# Patient Record
Sex: Female | Born: 1960 | Race: White | Hispanic: No | Smoking: Never smoker
Health system: Southern US, Community
[De-identification: ages and names within clinical notes are randomized; demographics above are authoritative.]

## PROBLEM LIST (undated history)

## (undated) DIAGNOSIS — F329 Major depressive disorder, single episode, unspecified: Secondary | ICD-10-CM

## (undated) DIAGNOSIS — K219 Gastro-esophageal reflux disease without esophagitis: Secondary | ICD-10-CM

## (undated) DIAGNOSIS — J069 Acute upper respiratory infection, unspecified: Secondary | ICD-10-CM

## (undated) DIAGNOSIS — G4730 Sleep apnea, unspecified: Secondary | ICD-10-CM

## (undated) DIAGNOSIS — M1990 Unspecified osteoarthritis, unspecified site: Secondary | ICD-10-CM

## (undated) DIAGNOSIS — F32A Depression, unspecified: Secondary | ICD-10-CM

## (undated) DIAGNOSIS — Z9889 Other specified postprocedural states: Secondary | ICD-10-CM

## (undated) DIAGNOSIS — T8859XA Other complications of anesthesia, initial encounter: Secondary | ICD-10-CM

## (undated) DIAGNOSIS — R112 Nausea with vomiting, unspecified: Secondary | ICD-10-CM

## (undated) DIAGNOSIS — I10 Essential (primary) hypertension: Secondary | ICD-10-CM

## (undated) DIAGNOSIS — J309 Allergic rhinitis, unspecified: Secondary | ICD-10-CM

## (undated) DIAGNOSIS — E0590 Thyrotoxicosis, unspecified without thyrotoxic crisis or storm: Secondary | ICD-10-CM

## (undated) DIAGNOSIS — K8020 Calculus of gallbladder without cholecystitis without obstruction: Secondary | ICD-10-CM

## (undated) DIAGNOSIS — I509 Heart failure, unspecified: Secondary | ICD-10-CM

## (undated) DIAGNOSIS — F419 Anxiety disorder, unspecified: Secondary | ICD-10-CM

## (undated) DIAGNOSIS — D649 Anemia, unspecified: Secondary | ICD-10-CM

## (undated) DIAGNOSIS — T4145XA Adverse effect of unspecified anesthetic, initial encounter: Secondary | ICD-10-CM

## (undated) DIAGNOSIS — J45909 Unspecified asthma, uncomplicated: Secondary | ICD-10-CM

## (undated) DIAGNOSIS — Z87442 Personal history of urinary calculi: Secondary | ICD-10-CM

## (undated) DIAGNOSIS — R519 Headache, unspecified: Secondary | ICD-10-CM

## (undated) DIAGNOSIS — J189 Pneumonia, unspecified organism: Secondary | ICD-10-CM

## (undated) HISTORY — DX: Gastro-esophageal reflux disease without esophagitis: K21.9

## (undated) HISTORY — DX: Allergic rhinitis, unspecified: J30.9

## (undated) HISTORY — DX: Major depressive disorder, single episode, unspecified: F32.9

## (undated) HISTORY — DX: Unspecified asthma, uncomplicated: J45.909

## (undated) HISTORY — DX: Depression, unspecified: F32.A

## (undated) HISTORY — PX: ADENOIDECTOMY: SUR15

## (undated) HISTORY — DX: Acute upper respiratory infection, unspecified: J06.9

## (undated) HISTORY — DX: Calculus of gallbladder without cholecystitis without obstruction: K80.20

## (undated) HISTORY — PX: OTHER SURGICAL HISTORY: SHX169

## (undated) HISTORY — PX: TYMPANOSTOMY TUBE PLACEMENT: SHX32

## (undated) HISTORY — DX: Heart failure, unspecified: I50.9

## (undated) HISTORY — PX: TONSILLECTOMY: SUR1361

## (undated) HISTORY — PX: CYST EXCISION: SHX5701

## (undated) HISTORY — PX: OVARIAN CYST REMOVAL: SHX89

---

## 2002-08-09 ENCOUNTER — Emergency Department (HOSPITAL_COMMUNITY): Admission: EM | Admit: 2002-08-09 | Discharge: 2002-08-09 | Payer: Self-pay | Admitting: *Deleted

## 2002-11-28 ENCOUNTER — Encounter: Admission: RE | Admit: 2002-11-28 | Discharge: 2002-11-28 | Payer: Self-pay | Admitting: Family Medicine

## 2002-12-09 ENCOUNTER — Encounter: Admission: RE | Admit: 2002-12-09 | Discharge: 2002-12-09 | Payer: Self-pay | Admitting: Orthopedic Surgery

## 2003-01-02 ENCOUNTER — Other Ambulatory Visit: Admission: RE | Admit: 2003-01-02 | Discharge: 2003-01-02 | Payer: Self-pay | Admitting: Family Medicine

## 2003-01-02 ENCOUNTER — Encounter: Admission: RE | Admit: 2003-01-02 | Discharge: 2003-01-02 | Payer: Self-pay | Admitting: Family Medicine

## 2003-01-02 ENCOUNTER — Encounter: Payer: Self-pay | Admitting: Family Medicine

## 2003-01-16 ENCOUNTER — Encounter: Admission: RE | Admit: 2003-01-16 | Discharge: 2003-01-16 | Payer: Self-pay | Admitting: Sports Medicine

## 2003-01-23 ENCOUNTER — Encounter: Admission: RE | Admit: 2003-01-23 | Discharge: 2003-01-23 | Payer: Self-pay | Admitting: Family Medicine

## 2003-02-17 ENCOUNTER — Encounter: Admission: RE | Admit: 2003-02-17 | Discharge: 2003-02-17 | Payer: Self-pay | Admitting: Family Medicine

## 2003-03-09 ENCOUNTER — Encounter: Admission: RE | Admit: 2003-03-09 | Discharge: 2003-03-09 | Payer: Self-pay | Admitting: Family Medicine

## 2003-04-10 ENCOUNTER — Encounter: Admission: RE | Admit: 2003-04-10 | Discharge: 2003-04-10 | Payer: Self-pay | Admitting: Sports Medicine

## 2003-04-20 ENCOUNTER — Encounter: Admission: RE | Admit: 2003-04-20 | Discharge: 2003-04-20 | Payer: Self-pay | Admitting: Family Medicine

## 2003-05-11 ENCOUNTER — Encounter: Admission: RE | Admit: 2003-05-11 | Discharge: 2003-05-11 | Payer: Self-pay | Admitting: Family Medicine

## 2003-07-07 ENCOUNTER — Encounter: Admission: RE | Admit: 2003-07-07 | Discharge: 2003-07-07 | Payer: Self-pay | Admitting: Family Medicine

## 2003-08-04 ENCOUNTER — Encounter: Admission: RE | Admit: 2003-08-04 | Discharge: 2003-08-04 | Payer: Self-pay | Admitting: Family Medicine

## 2003-08-24 ENCOUNTER — Encounter: Admission: RE | Admit: 2003-08-24 | Discharge: 2003-08-24 | Payer: Self-pay | Admitting: Family Medicine

## 2003-09-15 ENCOUNTER — Encounter: Admission: RE | Admit: 2003-09-15 | Discharge: 2003-09-15 | Payer: Self-pay | Admitting: Sports Medicine

## 2003-10-02 ENCOUNTER — Encounter: Admission: RE | Admit: 2003-10-02 | Discharge: 2003-10-02 | Payer: Self-pay | Admitting: Family Medicine

## 2003-11-27 ENCOUNTER — Encounter: Admission: RE | Admit: 2003-11-27 | Discharge: 2003-11-27 | Payer: Self-pay | Admitting: Family Medicine

## 2003-12-08 ENCOUNTER — Encounter: Admission: RE | Admit: 2003-12-08 | Discharge: 2003-12-08 | Payer: Self-pay | Admitting: Family Medicine

## 2004-03-18 ENCOUNTER — Ambulatory Visit: Payer: Self-pay | Admitting: Family Medicine

## 2004-03-18 ENCOUNTER — Other Ambulatory Visit: Admission: RE | Admit: 2004-03-18 | Discharge: 2004-03-18 | Payer: Self-pay | Admitting: Family Medicine

## 2004-04-04 ENCOUNTER — Ambulatory Visit: Payer: Self-pay | Admitting: Family Medicine

## 2004-05-17 ENCOUNTER — Ambulatory Visit: Payer: Self-pay | Admitting: Family Medicine

## 2004-06-10 ENCOUNTER — Ambulatory Visit: Payer: Self-pay | Admitting: Family Medicine

## 2004-06-14 ENCOUNTER — Encounter: Admission: RE | Admit: 2004-06-14 | Discharge: 2004-06-14 | Payer: Self-pay | Admitting: Family Medicine

## 2004-06-24 ENCOUNTER — Encounter: Admission: RE | Admit: 2004-06-24 | Discharge: 2004-06-24 | Payer: Self-pay | Admitting: Family Medicine

## 2004-09-19 ENCOUNTER — Ambulatory Visit: Payer: Self-pay | Admitting: Family Medicine

## 2004-10-17 ENCOUNTER — Ambulatory Visit: Payer: Self-pay | Admitting: Family Medicine

## 2004-11-07 ENCOUNTER — Ambulatory Visit: Payer: Self-pay | Admitting: Family Medicine

## 2005-01-09 ENCOUNTER — Ambulatory Visit: Payer: Self-pay | Admitting: Family Medicine

## 2005-02-15 ENCOUNTER — Ambulatory Visit: Payer: Self-pay | Admitting: Family Medicine

## 2005-03-05 ENCOUNTER — Emergency Department (HOSPITAL_COMMUNITY): Admission: EM | Admit: 2005-03-05 | Discharge: 2005-03-05 | Payer: Self-pay | Admitting: *Deleted

## 2005-03-06 ENCOUNTER — Ambulatory Visit: Payer: Self-pay | Admitting: Family Medicine

## 2005-03-31 ENCOUNTER — Ambulatory Visit: Payer: Self-pay | Admitting: Family Medicine

## 2005-04-05 ENCOUNTER — Encounter: Admission: RE | Admit: 2005-04-05 | Discharge: 2005-04-05 | Payer: Self-pay | Admitting: Family Medicine

## 2005-06-09 ENCOUNTER — Ambulatory Visit: Payer: Self-pay | Admitting: Family Medicine

## 2005-06-19 ENCOUNTER — Ambulatory Visit: Payer: Self-pay | Admitting: Family Medicine

## 2005-06-26 ENCOUNTER — Ambulatory Visit: Payer: Self-pay | Admitting: Family Medicine

## 2005-09-27 ENCOUNTER — Encounter: Admission: RE | Admit: 2005-09-27 | Discharge: 2005-09-27 | Payer: Self-pay | Admitting: Sports Medicine

## 2005-09-27 ENCOUNTER — Ambulatory Visit: Payer: Self-pay | Admitting: Family Medicine

## 2005-10-03 ENCOUNTER — Encounter (INDEPENDENT_AMBULATORY_CARE_PROVIDER_SITE_OTHER): Payer: Self-pay | Admitting: *Deleted

## 2005-10-03 LAB — CONVERTED CEMR LAB

## 2005-11-03 ENCOUNTER — Ambulatory Visit: Payer: Self-pay | Admitting: Family Medicine

## 2006-01-29 ENCOUNTER — Ambulatory Visit: Payer: Self-pay | Admitting: Family Medicine

## 2006-02-12 ENCOUNTER — Ambulatory Visit: Payer: Self-pay | Admitting: Family Medicine

## 2006-03-07 ENCOUNTER — Ambulatory Visit: Payer: Self-pay | Admitting: Family Medicine

## 2006-04-02 ENCOUNTER — Ambulatory Visit: Payer: Self-pay | Admitting: Family Medicine

## 2006-05-11 ENCOUNTER — Encounter: Admission: RE | Admit: 2006-05-11 | Discharge: 2006-05-11 | Payer: Self-pay | Admitting: Sports Medicine

## 2006-05-31 ENCOUNTER — Ambulatory Visit: Payer: Self-pay | Admitting: Family Medicine

## 2006-07-19 ENCOUNTER — Ambulatory Visit: Payer: Self-pay | Admitting: Sports Medicine

## 2006-08-02 DIAGNOSIS — I10 Essential (primary) hypertension: Secondary | ICD-10-CM | POA: Insufficient documentation

## 2006-08-02 DIAGNOSIS — M1710 Unilateral primary osteoarthritis, unspecified knee: Secondary | ICD-10-CM

## 2006-08-02 DIAGNOSIS — K2100 Gastro-esophageal reflux disease with esophagitis, without bleeding: Secondary | ICD-10-CM | POA: Insufficient documentation

## 2006-08-02 DIAGNOSIS — IMO0002 Reserved for concepts with insufficient information to code with codable children: Secondary | ICD-10-CM | POA: Insufficient documentation

## 2006-08-02 DIAGNOSIS — IMO0001 Reserved for inherently not codable concepts without codable children: Secondary | ICD-10-CM | POA: Insufficient documentation

## 2006-08-02 DIAGNOSIS — E7800 Pure hypercholesterolemia, unspecified: Secondary | ICD-10-CM | POA: Insufficient documentation

## 2006-08-02 DIAGNOSIS — G932 Benign intracranial hypertension: Secondary | ICD-10-CM | POA: Insufficient documentation

## 2006-08-02 DIAGNOSIS — G5600 Carpal tunnel syndrome, unspecified upper limb: Secondary | ICD-10-CM | POA: Insufficient documentation

## 2006-08-02 DIAGNOSIS — F411 Generalized anxiety disorder: Secondary | ICD-10-CM | POA: Insufficient documentation

## 2006-08-02 DIAGNOSIS — N200 Calculus of kidney: Secondary | ICD-10-CM | POA: Insufficient documentation

## 2006-08-02 DIAGNOSIS — N946 Dysmenorrhea, unspecified: Secondary | ICD-10-CM | POA: Insufficient documentation

## 2006-08-02 DIAGNOSIS — J309 Allergic rhinitis, unspecified: Secondary | ICD-10-CM | POA: Insufficient documentation

## 2006-08-02 DIAGNOSIS — K649 Unspecified hemorrhoids: Secondary | ICD-10-CM | POA: Insufficient documentation

## 2006-08-02 DIAGNOSIS — E669 Obesity, unspecified: Secondary | ICD-10-CM | POA: Insufficient documentation

## 2006-08-03 ENCOUNTER — Encounter (INDEPENDENT_AMBULATORY_CARE_PROVIDER_SITE_OTHER): Payer: Self-pay | Admitting: *Deleted

## 2006-09-04 ENCOUNTER — Telehealth: Payer: Self-pay | Admitting: *Deleted

## 2006-09-04 ENCOUNTER — Ambulatory Visit: Payer: Self-pay | Admitting: Sports Medicine

## 2006-09-28 ENCOUNTER — Encounter: Admission: RE | Admit: 2006-09-28 | Discharge: 2006-09-28 | Payer: Self-pay | Admitting: Family Medicine

## 2006-09-28 ENCOUNTER — Ambulatory Visit: Payer: Self-pay | Admitting: Family Medicine

## 2006-10-10 ENCOUNTER — Telehealth: Payer: Self-pay | Admitting: *Deleted

## 2006-10-11 ENCOUNTER — Ambulatory Visit: Payer: Self-pay | Admitting: Family Medicine

## 2006-10-11 ENCOUNTER — Telehealth (INDEPENDENT_AMBULATORY_CARE_PROVIDER_SITE_OTHER): Payer: Self-pay | Admitting: *Deleted

## 2006-11-05 ENCOUNTER — Encounter: Payer: Self-pay | Admitting: Family Medicine

## 2006-11-26 ENCOUNTER — Ambulatory Visit: Payer: Self-pay | Admitting: Family Medicine

## 2006-11-26 DIAGNOSIS — M545 Low back pain, unspecified: Secondary | ICD-10-CM | POA: Insufficient documentation

## 2006-11-26 DIAGNOSIS — J45909 Unspecified asthma, uncomplicated: Secondary | ICD-10-CM | POA: Insufficient documentation

## 2006-11-26 HISTORY — DX: Unspecified asthma, uncomplicated: J45.909

## 2006-11-26 LAB — CONVERTED CEMR LAB
ALT: 18 units/L (ref 0–35)
AST: 19 units/L (ref 0–37)
Albumin: 4.7 g/dL (ref 3.5–5.2)
Alkaline Phosphatase: 90 units/L (ref 39–117)
BUN: 9 mg/dL (ref 6–23)
CO2: 22 meq/L (ref 19–32)
Calcium: 10.2 mg/dL (ref 8.4–10.5)
Chloride: 102 meq/L (ref 96–112)
Creatinine, Ser: 0.81 mg/dL (ref 0.40–1.20)
Glucose, Bld: 89 mg/dL (ref 70–99)
HCT: 47.1 % — ABNORMAL HIGH (ref 36.0–46.0)
Hemoglobin: 15.6 g/dL — ABNORMAL HIGH (ref 12.0–15.0)
MCHC: 33.1 g/dL (ref 30.0–36.0)
MCV: 87.9 fL (ref 78.0–100.0)
Platelets: 255 10*3/uL (ref 150–400)
Potassium: 4 meq/L (ref 3.5–5.3)
RBC: 5.36 M/uL — ABNORMAL HIGH (ref 3.87–5.11)
RDW: 13.8 % (ref 11.5–14.0)
Sodium: 140 meq/L (ref 135–145)
Total Bilirubin: 0.8 mg/dL (ref 0.3–1.2)
Total Protein: 8.2 g/dL (ref 6.0–8.3)
WBC: 7.7 10*3/uL (ref 4.0–10.5)

## 2007-02-05 ENCOUNTER — Telehealth (INDEPENDENT_AMBULATORY_CARE_PROVIDER_SITE_OTHER): Payer: Self-pay | Admitting: *Deleted

## 2007-02-18 ENCOUNTER — Ambulatory Visit: Payer: Self-pay | Admitting: Family Medicine

## 2007-03-20 ENCOUNTER — Ambulatory Visit: Payer: Self-pay | Admitting: Family Medicine

## 2007-03-21 ENCOUNTER — Telehealth (INDEPENDENT_AMBULATORY_CARE_PROVIDER_SITE_OTHER): Payer: Self-pay | Admitting: *Deleted

## 2007-03-22 ENCOUNTER — Telehealth: Payer: Self-pay | Admitting: *Deleted

## 2007-03-26 ENCOUNTER — Telehealth: Payer: Self-pay | Admitting: *Deleted

## 2007-03-27 ENCOUNTER — Encounter: Payer: Self-pay | Admitting: *Deleted

## 2007-04-01 ENCOUNTER — Telehealth: Payer: Self-pay | Admitting: Family Medicine

## 2007-04-29 ENCOUNTER — Telehealth: Payer: Self-pay | Admitting: *Deleted

## 2007-04-29 ENCOUNTER — Ambulatory Visit: Payer: Self-pay | Admitting: Family Medicine

## 2007-05-01 LAB — CONVERTED CEMR LAB
ALT: 13 units/L (ref 0–35)
AST: 13 units/L (ref 0–37)
Albumin: 4.5 g/dL (ref 3.5–5.2)
Alkaline Phosphatase: 79 units/L (ref 39–117)
BUN: 12 mg/dL (ref 6–23)
CO2: 22 meq/L (ref 19–32)
Calcium: 9.7 mg/dL (ref 8.4–10.5)
Chloride: 104 meq/L (ref 96–112)
Cholesterol: 221 mg/dL — ABNORMAL HIGH (ref 0–200)
Creatinine, Ser: 0.79 mg/dL (ref 0.40–1.20)
Glucose, Bld: 94 mg/dL (ref 70–99)
HDL: 40 mg/dL (ref >39)
LDL Cholesterol: 122 mg/dL — ABNORMAL HIGH (ref 0–99)
Potassium: 4.1 meq/L (ref 3.5–5.3)
Sodium: 139 meq/L (ref 135–145)
Total Bilirubin: 0.5 mg/dL (ref 0.3–1.2)
Total CHOL/HDL Ratio: 5.5
Total Protein: 7.5 g/dL (ref 6.0–8.3)
Triglycerides: 296 mg/dL — ABNORMAL HIGH (ref <150)
VLDL: 59 mg/dL — ABNORMAL HIGH (ref 0–40)

## 2007-05-08 ENCOUNTER — Encounter: Payer: Self-pay | Admitting: Family Medicine

## 2007-05-13 ENCOUNTER — Encounter: Admission: RE | Admit: 2007-05-13 | Discharge: 2007-05-13 | Payer: Self-pay | Admitting: Family Medicine

## 2007-05-21 ENCOUNTER — Telehealth: Payer: Self-pay | Admitting: *Deleted

## 2007-06-19 ENCOUNTER — Encounter: Payer: Self-pay | Admitting: Family Medicine

## 2007-07-08 ENCOUNTER — Ambulatory Visit: Payer: Self-pay | Admitting: Family Medicine

## 2007-07-08 ENCOUNTER — Encounter: Payer: Self-pay | Admitting: *Deleted

## 2007-07-08 ENCOUNTER — Telehealth: Payer: Self-pay | Admitting: *Deleted

## 2007-07-12 ENCOUNTER — Telehealth: Payer: Self-pay | Admitting: Family Medicine

## 2007-07-17 ENCOUNTER — Telehealth: Payer: Self-pay | Admitting: *Deleted

## 2007-07-17 ENCOUNTER — Ambulatory Visit: Payer: Self-pay | Admitting: Family Medicine

## 2007-07-17 DIAGNOSIS — L219 Seborrheic dermatitis, unspecified: Secondary | ICD-10-CM | POA: Insufficient documentation

## 2007-07-24 ENCOUNTER — Telehealth: Payer: Self-pay | Admitting: *Deleted

## 2007-07-30 ENCOUNTER — Telehealth: Payer: Self-pay | Admitting: *Deleted

## 2007-07-31 ENCOUNTER — Ambulatory Visit: Payer: Self-pay | Admitting: Family Medicine

## 2007-10-30 ENCOUNTER — Telehealth: Payer: Self-pay | Admitting: *Deleted

## 2007-10-30 LAB — CONVERTED CEMR LAB
Pap Smear: NORMAL
Pap Smear: NORMAL

## 2007-11-01 ENCOUNTER — Encounter: Payer: Self-pay | Admitting: Family Medicine

## 2007-11-01 ENCOUNTER — Ambulatory Visit: Payer: Self-pay | Admitting: Family Medicine

## 2007-11-08 ENCOUNTER — Ambulatory Visit: Payer: Self-pay | Admitting: Family Medicine

## 2007-11-08 LAB — CONVERTED CEMR LAB
BUN: 11 mg/dL (ref 6–23)
CO2: 21 meq/L (ref 19–32)
Calcium: 9.4 mg/dL (ref 8.4–10.5)
Chloride: 104 meq/L (ref 96–112)
Creatinine, Ser: 0.73 mg/dL (ref 0.40–1.20)
Glucose, Bld: 101 mg/dL — ABNORMAL HIGH (ref 70–99)
HCT: 43.3 % (ref 36.0–46.0)
Hemoglobin: 14.3 g/dL (ref 12.0–15.0)
MCHC: 33 g/dL (ref 30.0–36.0)
MCV: 91.2 fL (ref 78.0–100.0)
Platelets: 228 10*3/uL (ref 150–400)
Potassium: 4 meq/L (ref 3.5–5.3)
RBC: 4.75 M/uL (ref 3.87–5.11)
RDW: 13.6 % (ref 11.5–15.5)
Sodium: 139 meq/L (ref 135–145)
TSH: 0.707 microintl units/mL (ref 0.350–5.50)
WBC: 6.6 10*3/uL (ref 4.0–10.5)

## 2007-12-12 ENCOUNTER — Encounter: Payer: Self-pay | Admitting: Family Medicine

## 2008-01-14 ENCOUNTER — Encounter: Payer: Self-pay | Admitting: Family Medicine

## 2008-03-04 ENCOUNTER — Ambulatory Visit: Payer: Self-pay | Admitting: Family Medicine

## 2008-04-03 ENCOUNTER — Encounter: Payer: Self-pay | Admitting: Family Medicine

## 2008-05-13 ENCOUNTER — Encounter: Admission: RE | Admit: 2008-05-13 | Discharge: 2008-05-13 | Payer: Self-pay | Admitting: Family Medicine

## 2008-06-12 ENCOUNTER — Ambulatory Visit: Payer: Self-pay | Admitting: Family Medicine

## 2008-06-12 ENCOUNTER — Telehealth: Payer: Self-pay | Admitting: *Deleted

## 2008-06-26 ENCOUNTER — Encounter (INDEPENDENT_AMBULATORY_CARE_PROVIDER_SITE_OTHER): Payer: Self-pay | Admitting: *Deleted

## 2008-07-09 ENCOUNTER — Telehealth: Payer: Self-pay | Admitting: Family Medicine

## 2008-07-09 ENCOUNTER — Ambulatory Visit: Payer: Self-pay | Admitting: Family Medicine

## 2008-07-22 ENCOUNTER — Emergency Department (HOSPITAL_COMMUNITY): Admission: EM | Admit: 2008-07-22 | Discharge: 2008-07-22 | Payer: Self-pay | Admitting: Emergency Medicine

## 2008-07-22 ENCOUNTER — Telehealth: Payer: Self-pay | Admitting: Family Medicine

## 2008-08-13 ENCOUNTER — Encounter: Payer: Self-pay | Admitting: Family Medicine

## 2008-08-26 ENCOUNTER — Ambulatory Visit: Payer: Self-pay | Admitting: Family Medicine

## 2008-09-11 ENCOUNTER — Encounter: Payer: Self-pay | Admitting: Family Medicine

## 2008-10-12 ENCOUNTER — Encounter (INDEPENDENT_AMBULATORY_CARE_PROVIDER_SITE_OTHER): Payer: Self-pay | Admitting: Family Medicine

## 2008-10-12 ENCOUNTER — Ambulatory Visit: Payer: Self-pay | Admitting: Family Medicine

## 2008-10-12 ENCOUNTER — Telehealth: Payer: Self-pay | Admitting: Family Medicine

## 2008-10-12 LAB — CONVERTED CEMR LAB
Bilirubin Urine: NEGATIVE
Glucose, Urine, Semiquant: NEGATIVE
Ketones, urine, test strip: NEGATIVE
Nitrite: NEGATIVE
Protein, U semiquant: NEGATIVE
Specific Gravity, Urine: 1.025
Urobilinogen, UA: 0.2
WBC Urine, dipstick: NEGATIVE
pH: 6

## 2008-10-13 ENCOUNTER — Telehealth (INDEPENDENT_AMBULATORY_CARE_PROVIDER_SITE_OTHER): Payer: Self-pay | Admitting: *Deleted

## 2008-10-30 ENCOUNTER — Ambulatory Visit: Payer: Self-pay | Admitting: Family Medicine

## 2008-11-03 LAB — CONVERTED CEMR LAB
ALT: 12 units/L (ref 0–35)
AST: 12 units/L (ref 0–37)
Albumin: 4.2 g/dL (ref 3.5–5.2)
Alkaline Phosphatase: 86 units/L (ref 39–117)
BUN: 16 mg/dL (ref 6–23)
CO2: 22 meq/L (ref 19–32)
Calcium: 9.5 mg/dL (ref 8.4–10.5)
Chloride: 106 meq/L (ref 96–112)
Cholesterol: 249 mg/dL — ABNORMAL HIGH (ref 0–200)
Creatinine, Ser: 0.8 mg/dL (ref 0.40–1.20)
Glucose, Bld: 111 mg/dL — ABNORMAL HIGH (ref 70–99)
HDL: 40 mg/dL (ref >39)
LDL Cholesterol: 140 mg/dL — ABNORMAL HIGH (ref 0–99)
Potassium: 4.2 meq/L (ref 3.5–5.3)
Sodium: 142 meq/L (ref 135–145)
TSH: 0.596 microintl units/mL (ref 0.350–4.500)
Total Bilirubin: 0.4 mg/dL (ref 0.3–1.2)
Total CHOL/HDL Ratio: 6.2
Total Protein: 7 g/dL (ref 6.0–8.3)
Triglycerides: 346 mg/dL — ABNORMAL HIGH (ref <150)
VLDL: 69 mg/dL — ABNORMAL HIGH (ref 0–40)

## 2008-11-04 ENCOUNTER — Telehealth: Payer: Self-pay | Admitting: *Deleted

## 2009-02-15 ENCOUNTER — Encounter: Payer: Self-pay | Admitting: Family Medicine

## 2009-03-10 ENCOUNTER — Ambulatory Visit: Payer: Self-pay | Admitting: Family Medicine

## 2009-03-10 DIAGNOSIS — G4730 Sleep apnea, unspecified: Secondary | ICD-10-CM | POA: Insufficient documentation

## 2009-03-12 ENCOUNTER — Encounter: Payer: Self-pay | Admitting: Family Medicine

## 2009-03-12 ENCOUNTER — Ambulatory Visit: Payer: Self-pay | Admitting: Family Medicine

## 2009-03-16 ENCOUNTER — Encounter: Payer: Self-pay | Admitting: *Deleted

## 2009-03-22 ENCOUNTER — Ambulatory Visit: Payer: Self-pay | Admitting: Family Medicine

## 2009-03-22 ENCOUNTER — Telehealth: Payer: Self-pay | Admitting: Family Medicine

## 2009-03-31 ENCOUNTER — Encounter: Payer: Self-pay | Admitting: Family Medicine

## 2009-04-19 ENCOUNTER — Encounter: Payer: Self-pay | Admitting: Family Medicine

## 2009-04-19 ENCOUNTER — Ambulatory Visit (HOSPITAL_BASED_OUTPATIENT_CLINIC_OR_DEPARTMENT_OTHER): Admission: RE | Admit: 2009-04-19 | Discharge: 2009-04-19 | Payer: Self-pay | Admitting: Family Medicine

## 2009-05-14 ENCOUNTER — Encounter: Admission: RE | Admit: 2009-05-14 | Discharge: 2009-05-14 | Payer: Self-pay | Admitting: Family Medicine

## 2009-06-25 ENCOUNTER — Ambulatory Visit: Payer: Self-pay | Admitting: Family Medicine

## 2009-06-25 ENCOUNTER — Telehealth: Payer: Self-pay | Admitting: Family Medicine

## 2009-07-08 ENCOUNTER — Ambulatory Visit: Payer: Self-pay | Admitting: Family Medicine

## 2009-07-08 DIAGNOSIS — H7290 Unspecified perforation of tympanic membrane, unspecified ear: Secondary | ICD-10-CM | POA: Insufficient documentation

## 2009-07-22 ENCOUNTER — Encounter: Payer: Self-pay | Admitting: Family Medicine

## 2009-07-23 ENCOUNTER — Ambulatory Visit: Payer: Self-pay | Admitting: Family Medicine

## 2009-07-23 DIAGNOSIS — I781 Nevus, non-neoplastic: Secondary | ICD-10-CM | POA: Insufficient documentation

## 2009-07-23 LAB — CONVERTED CEMR LAB
ALT: 11 units/L (ref 0–35)
AST: 11 units/L (ref 0–37)
Albumin: 4.5 g/dL (ref 3.5–5.2)
Alkaline Phosphatase: 78 units/L (ref 39–117)
BUN: 17 mg/dL (ref 6–23)
CO2: 24 meq/L (ref 19–32)
Calcium: 9.6 mg/dL (ref 8.4–10.5)
Chloride: 99 meq/L (ref 96–112)
Creatinine, Ser: 0.73 mg/dL (ref 0.40–1.20)
Direct LDL: 95 mg/dL
Glucose, Bld: 119 mg/dL — ABNORMAL HIGH (ref 70–99)
Potassium: 4.1 meq/L (ref 3.5–5.3)
Sodium: 137 meq/L (ref 135–145)
Total Bilirubin: 0.5 mg/dL (ref 0.3–1.2)
Total Protein: 7.4 g/dL (ref 6.0–8.3)

## 2009-07-27 ENCOUNTER — Encounter: Payer: Self-pay | Admitting: Family Medicine

## 2009-08-01 ENCOUNTER — Telehealth: Payer: Self-pay | Admitting: Family Medicine

## 2009-08-04 ENCOUNTER — Telehealth: Payer: Self-pay | Admitting: Family Medicine

## 2009-10-21 ENCOUNTER — Telehealth: Payer: Self-pay | Admitting: Family Medicine

## 2009-10-22 ENCOUNTER — Ambulatory Visit: Payer: Self-pay | Admitting: Family Medicine

## 2009-10-27 ENCOUNTER — Ambulatory Visit: Payer: Self-pay | Admitting: Family Medicine

## 2010-02-24 ENCOUNTER — Encounter: Payer: Self-pay | Admitting: Family Medicine

## 2010-03-01 ENCOUNTER — Ambulatory Visit: Payer: Self-pay | Admitting: Family Medicine

## 2010-03-01 ENCOUNTER — Telehealth: Payer: Self-pay | Admitting: Family Medicine

## 2010-03-16 ENCOUNTER — Ambulatory Visit: Payer: Self-pay | Admitting: Family Medicine

## 2010-05-04 ENCOUNTER — Ambulatory Visit: Payer: Self-pay | Admitting: Family Medicine

## 2010-05-06 ENCOUNTER — Encounter: Payer: Self-pay | Admitting: Family Medicine

## 2010-05-06 ENCOUNTER — Ambulatory Visit: Payer: Self-pay | Admitting: Family Medicine

## 2010-05-09 LAB — CONVERTED CEMR LAB
ALT: 14 units/L (ref 0–35)
AST: 13 units/L (ref 0–37)
Albumin: 4 g/dL (ref 3.5–5.2)
Alkaline Phosphatase: 78 units/L (ref 39–117)
BUN: 11 mg/dL (ref 6–23)
CO2: 24 meq/L (ref 19–32)
Calcium: 8.7 mg/dL (ref 8.4–10.5)
Chloride: 104 meq/L (ref 96–112)
Cholesterol: 147 mg/dL (ref 0–200)
Creatinine, Ser: 0.68 mg/dL (ref 0.40–1.20)
Glucose, Bld: 107 mg/dL — ABNORMAL HIGH (ref 70–99)
HDL: 40 mg/dL (ref >39)
LDL Cholesterol: 75 mg/dL (ref 0–99)
Potassium: 4.5 meq/L (ref 3.5–5.3)
Sodium: 139 meq/L (ref 135–145)
Total Bilirubin: 0.4 mg/dL (ref 0.3–1.2)
Total CHOL/HDL Ratio: 3.7
Total Protein: 6.5 g/dL (ref 6.0–8.3)
Triglycerides: 159 mg/dL — ABNORMAL HIGH (ref <150)
VLDL: 32 mg/dL (ref 0–40)

## 2010-05-18 ENCOUNTER — Encounter
Admission: RE | Admit: 2010-05-18 | Discharge: 2010-05-18 | Payer: Self-pay | Source: Home / Self Care | Attending: Family Medicine | Admitting: Family Medicine

## 2010-06-30 ENCOUNTER — Ambulatory Visit
Admission: RE | Admit: 2010-06-30 | Discharge: 2010-06-30 | Payer: Self-pay | Source: Home / Self Care | Attending: Family Medicine | Admitting: Family Medicine

## 2010-06-30 DIAGNOSIS — R059 Cough, unspecified: Secondary | ICD-10-CM | POA: Insufficient documentation

## 2010-06-30 DIAGNOSIS — R05 Cough: Secondary | ICD-10-CM | POA: Insufficient documentation

## 2010-07-05 NOTE — Letter (Signed)
Summary: Generic Letter  [facility]  [address]   [place], [place] [address]   Phone: [phone]  Fax: [phone]    07/08/2007  Jamiria Andal [address] [place], [place]  [address]  To Whom This May Concern ,        Reyne Falconi  did come to [facility]  today for an appointment with her doctor.        Sincerely,   Dagny Fiorentino RN [facility]

## 2010-07-05 NOTE — Progress Notes (Signed)
Summary: Rx Req  Phone Note Refill Request Call back at Home Phone [phone] Message from:  Patient  Pt needs rx for Advair.  Pt uses [facility].  Initial call taken by: Clydell Hakim,  August 04, 2009 3:35 PM  Follow-up for Phone Call        will forward to MD. Adviar is not on medication list. Follow-up by: Theresia Lo RN,  August 04, 2009 4:00 PM  Additional Follow-up for Phone Call Additional follow up Details #1::        Pt on changed from Advair to Qvar in the fall.  should be usign that Additional Follow-up by: Pearlean Brownie MD,  August 05, 2009 8:23 AM    Additional Follow-up for Phone Call Additional follow up Details #2::    patient notified. she does have this but has never used. explained this is in place of advair.  Follow-up by: Theresia Lo RN,  August 05, 2009 8:33 AM

## 2010-07-05 NOTE — Assessment & Plan Note (Signed)
Summary: flu shot/kh  Nurse Visit   Vital Signs:  Patient profile:   50 year old female Temp:     49 degrees F  Vitals Entered By: Theresia Lo RN (March 16, 2010 4:49 PM)  Allergies: 1)  ! Etodolac (Etodolac) 2)  Amoxicillin (Amoxicillin) 3)  Diclofenac Sodium (Diclofenac Sodium) 4)  Iodine (Iodine) 5)  Prilosec (Omeprazole) 6)  Protonix (Pantoprazole Sodium)  Immunizations Administered:  Influenza Vaccine # 1:    Vaccine Type: Fluvax 3+    Site: left deltoid    Mfr: GlaxoSmithKline    Dose: 0.5 ml    Route: IM    Given by: Theresia Lo RN    Exp. Date: 11/30/2010    Lot #: [id number]    VIS given: 12/28/09 version given March 16, 2010.  Flu Vaccine Consent Questions:    Do you have a history of severe allergic reactions to this vaccine? no    Any prior history of allergic reactions to egg and/or gelatin? no    Do you have a sensitivity to the preservative Thimersol? no    Do you have a past history of Guillan-Barre Syndrome? no    Do you currently have an acute febrile illness? no    Have you ever had a severe reaction to latex? no    Vaccine information given and explained to patient? yes    Are you currently pregnant? no  Orders Added: 1)  Flu Vaccine 24yrs + [90658] 2)  Admin 1st Vaccine [09811]

## 2010-07-05 NOTE — Progress Notes (Signed)
Summary: Triage  Phone Note Call from Patient Call back at Home Phone [phone]   Summary of Call: wants to speak with rn about getting worked in today for a cough. Initial call taken by: Haydee Salter,  July 09, 2008 10:45 AM  Follow-up for Phone Call        c/o persistant cough. producvtive of clear mucous. disturbing sleep.denies fever. head congestion as well. workin with Dr. Lanier Prude at 11:15 Follow-up by: Golden Circle RN,  July 09, 2008 10:47 AM

## 2010-07-05 NOTE — Progress Notes (Signed)
Summary: REQUEST TO SPEAK TO RN  Phone Note Call from Patient   Caller: Patient Summary of Call: REQUESTING TO SPEAK TO RN RE: 3 SWOLLEN FINGER ON RIGHT HAND. CALL PT AT [phone] Initial call taken by: Dedra Skeens CMA,,  Oct 30, 2007 2:43 PM  Follow-up for Phone Call        3 fingers on R hand have "locked up" on her. carpel tunnel surgery  done 2006. swollen, painful. using elavil, flexeril, ibuprofen. desires appt fri am. pcp not available Follow-up by: Golden Circle RN,  Oct 30, 2007 2:49 PM

## 2010-07-05 NOTE — Assessment & Plan Note (Signed)
Summary: F/U ASTHMA [facility]   Vital Signs:  Patient Profile:   50 Years Old Female Weight:      330 pounds Temp:     99.2 degrees F Pulse rate:   84 / minute BP sitting:   158 / 85  (left arm)  Pt. in pain?   yes    Location:   back and knees    Intensity:   8    Type:       aching  Vitals Entered ByJacki Cones RN (November 26, 2006 1:47 PM)                PCP:  Doralee Albino MD  Chief Complaint:  f/u asthma and back and knee.  History of Present Illness: 1. C/O low back pain.  She is cleaning apartment in preparation for moving.    2. Needs note for letters documenting necessity of: a. move to a different place b. transportaion other than city bus c. air conditioning  3. C/O diarrhea.  Feels it may be do to stress of move.  Not eating much.  Weight noted to be down 10 lbs since last visit.  4. Also C/O carpal tunnel syndrome  Hands have more numbness.          Physical Exam  General:     alert, well-developed, and overweight-appearing.    Also appears mildly unkempt Lungs:     Normal respiratory effort, chest expands symmetrically. Lungs are clear to auscultation, no crackles or wheezes. Heart:     Normal rate and regular rhythm. S1 and S2 normal without gallop, murmur, click, rub or other extra sounds. Abdomen:     Normal, obese.  Possibly small infraumbilical hernia Msk:     Has low back pain.  No leg parasthesias.  DTRs OK    Impression & Recommendations:  Problem # 1:  ASTHMA NOS W/O STATUS ASTHMATICUS (ICD-493.90) No wheezing continue current Rx Orders: [facility]- Est  Level 4 (99214)   Problem # 2:  LOW BACK PAIN SYNDROME (ICD-724.2) Mostly related to lifting and obesity.  Continue Motrin 800 mg.  Added flexaril via Dr. Tiajuana Amass Orders: [facility]- Est  Level 4 ([address]109)   Problem # 3:  DIARRHEA, ACUTE (ICD-787.91) Doubt serious pathology.  Perhaps IBS with stress.  No intervention for now Orders: [facility]- Est  Level 4 (60454)   Problem # 4:  MALADJUSTMENT,  SOCIAL (ICD-V62.4) Very poor living situation.  Letter provided Orders: [facility]- Est  Level 4 (09811)   Problem # 5:  HYPERTENSION, BENIGN SYSTEMIC (ICD-401.1) Poor control now.  Previously OK No changes Orders: [facility] ([address]78-29562) [facility]- Est  Level 4 (13086)   Problem # 6:  MENSTRUATION, PAINFUL (ICD-625.3)  Orders: [facility] ([address]846)

## 2010-07-05 NOTE — Assessment & Plan Note (Signed)
Summary: ear pain   Vital Signs:  Patient Profile:   50 Years Old Female Weight:      239 pounds Temp:     97.7 degrees F Pulse rate:   79 / minute BP sitting:   134 / 83  Pt. in pain?   yes    Location:   right ear    Intensity:   6  Vitals Entered By: Jone Baseman CMA (July 17, 2007 1:36 PM)                  PCP:  Doralee Albino MD  Chief Complaint:  right ear pain.  History of Present Illness: Right ear pain started on Monday (2 days ago). Associated symptoms include popping in ears, nasal congestion, drainage down throat, frontal sinus pressure, HAs, ringing in ears, SOB, and sore throat on and off.  Denies fever, GI complaints, hearing loss, chestpain or palpitations.   Chronic cough and nasal congestion she feels is related to her asthma unchanged.  patient has two children that are sick with URI illnesses.  Patient also has chronic scalp itching and flaking that extends to hairline    Current Allergies: AMOXICILLIN (AMOXICILLIN) DICLOFENAC SODIUM (DICLOFENAC SODIUM) IODINE (IODINE) PRILOSEC (OMEPRAZOLE) PROTONIX (PANTOPRAZOLE SODIUM)  Past Medical History:    Reviewed history from 09/28/2006 and no changes required:       gestational diabetes -        at risk for DM Type II  Past Surgical History:    Reviewed history from 08/02/2006 and no changes required:       Rt carpal tunnel release - 12/04/2002   Family History:    Reviewed history from 08/02/2006 and no changes required:       - CVA, + osteoporosis, CAD, Ca, DM, EtOHism, high Chol  Social History:    Reviewed history from 08/02/2006 and no changes required:       Lives in government housing in [place] no [facility]; Has an old Zenaida Niece for transportation; non smoker, non drinker; lives with children age 60,4, and 44 (in 2004) father of children has left the family     Physical Exam  General:     Well-developed,well-nourished,in no acute distress; alert,appropriate and cooperative throughout  examinationoverweight-appearing.   Head:     Normocephalic and atraumatic without obvious abnormalities. No apparent alopecia or balding. Eyes:     No corneal or conjunctival inflammation noted. EOMI. Perrla. Funduscopic exam benign, without hemorrhages, exudates or papilledema. Vision grossly normal. Ears:     External ear exam shows no significant lesions or deformities.  Otoscopic examination reveals clear canals, tympanic membranes are intact bilaterally without bulging, retraction, inflammation or discharge. Hearing is grossly normal bilaterally. Nose:     mucosal erythema, L frontal sinus tenderness, and R frontal sinus tenderness.   Mouth:     postnasal drip.  posterior oropharyngeal erythema. Neck:     Dowanger's hump from obesity and poor posture.  Paraspinous muscle spasms. Lungs:     Normal respiratory effort, chest expands symmetrically. Lungs are clear to auscultation, no crackles or wheezes. Heart:     Normal rate and regular rhythm. S1 and S2 normal without gallop, murmur, click, rub or other extra sounds. Skin:     yellowish flaking in scalp and redness and flaking of skin near hairline    Impression & Recommendations:  Problem # 1:  VIRAL URI (ICD-465.9) Assessment: New No evidence of bacterial infection.  Reassured patient that antibiotics were not necessary.  Patient seems to have some Eustachian Tube dysfunction. So will give nasonex for symtpomatic relief and nasal saline drops Her updated medication list for this problem includes:    Ibuprofen 800 Mg Tabs (Ibuprofen) ..... One three times a day as needed for pain  Orders: [facility]- Est Level  3 (16109)   Problem # 2:  SEBORRHEIC DERMATITIS (ICD-690.10) Assessment: New Nizoral shampoo 2% apply daily as needed and taper down to weekly use Orders: [facility]- Est Level  3 (60454)   Complete Medication List: 1)  Advair Diskus 500-50 Mcg/dose Misc (Fluticasone-salmeterol) .... One puff twice daily 2)  Albuterol 90  Mcg/act Aers (Albuterol) .... Inhale 2 puff using inhaler every four hours 3)  Amitriptyline Hcl 25 Mg Tabs (Amitriptyline hcl) .... One tab three times daily 4)  Clonazepam 1 Mg Tabs (Clonazepam) .... Take 1 tablet by mouth twice a day 5)  Cyclobenzaprine Hcl 10 Mg Tabs (Cyclobenzaprine hcl) .... Take 1 tablet by mouth every night as needed for back/neck pain and spasm 6)  Fluconazole 100 Mg Tabs (Fluconazole) .Marland Kitchen.. 1 tablet by mouth once a day 7)  Fluoxetine Hcl 40 Mg Caps (Fluoxetine hcl) .... Two caps daily 8)  Hydrochlorothiazide 25 Mg Tabs (Hydrochlorothiazide) .... One by mouth daily 9)  Hydrocortisone 2.5 % Oint (Hydrocortisone) .... Apply a small amount to skin every night 10)  Lisinopril 10 Mg Tabs (Lisinopril) .... Take 1 tablet by mouth once a day 11)  Norethindrone Acetate 5 Mg Tabs (Norethindrone acetate) .... Take 1 tablet by mouth once a day 12)  Pepcid 20 Mg Tabs (Famotidine) .... One tab daily 13)  Simvastatin 20 Mg Tabs (Simvastatin) .... Take 1 tablet by mouth every night 14)  Trazodone Hcl 100 Mg Tabs (Trazodone hcl) .... Take 1 tablet by mouth at bedtime 15)  Ibuprofen 800 Mg Tabs (Ibuprofen) .... One three times a day as needed for pain   Patient Instructions: 1)  you seem to have a sinus infection so I have prescribed nasonex and nasal saline to help 2)  you can also take ibuprofen for pain relief 3)  call if you get fever >101 or worsening symtpoms    ]

## 2010-07-05 NOTE — Assessment & Plan Note (Signed)
Summary: neck, upper back and shoulder pain/ls   Vital Signs:  Patient Profile:   50 Years Old Female Weight:      343.1 pounds Temp:     97.1 degrees F Pulse rate:   80 / minute BP sitting:   131 / 90  (right arm)  Pt. in pain?   yes    Location:   head    Intensity:   4  Vitals Entered By: Starleen Blue RN (July 31, 2007 9:35 AM)                  PCP:  Doralee Albino MD  Chief Complaint:  Shoulder pain.  History of Present Illness: pain: pain in R shoulder, hurting bad, arm is swollen per her report.  Started hurting 2/5 weeks ago, sleeps with alot of pillows.  feels like a big ball or tight.  putting heat on it, helps, but gets too hot.  Also pops.  Pt has tried Rx 800mg  ibuprofen and flexeril 10mg  only at night.  occasionally relief from that pain with flexeril.    Sleeps on R side.  Obese.  not doing stretching or exercises.  lots of stress lately.  also with fibromyalgia.  9/10 on pain.      Current Allergies: AMOXICILLIN (AMOXICILLIN) DICLOFENAC SODIUM (DICLOFENAC SODIUM) IODINE (IODINE) PRILOSEC (OMEPRAZOLE) PROTONIX (PANTOPRAZOLE SODIUM)  Past Medical History:    gestational diabetes -     at risk for DM Type II    fibromyalgia  Past Surgical History:    Rt carpal tunnel release - 12/04/2002     Family History:    - CVA, + osteoporosis, CAD, Ca, DM, EtOHism, high Chol    Social History:    Lives in government housing in [place] no [place]; Has an old Zenaida Niece for transportation; non smoker, non drinker; lives with children age 48,4, and 62 (in 2004) father of children has left the family       Physical Exam  General:     Well-developed,obese,in no acute distress; alert,appropriate and cooperative throughout examination   Neck:     + muscle spasm with tightness, and soreness radiating down R arm and shoulder, no radiculopathy symptoms, negative spurling's sign Lungs:     Normal respiratory effort, chest expands symmetrically. Lungs are clear to  auscultation, no crackles or wheezes. Heart:     Normal rate and regular rhythm. S1 and S2 normal without gallop, murmur, click, rub or other extra sounds. Msk:     Cervical spine with FROM, no point tenderness.  Thoracic, lumbar, and sacral spine without point tenderness.     Impression & Recommendations:  Problem # 1:  NECK SPASM (ICD-847.0) Assessment: New Pt already with medications but not maximizing therapy.  Pt is also on alot of medications with multiple "drug reactions" to various medications.  Will maximize current doses of home meds of flexeril to three times a day dosing and Ibuprofen three times a day with food.  Followup with pcp in 2-3 weeks if not improved with current therapy.  If no better, consider trigger point injection vs massage therapy vs physical therapy.   Her updated medication list for this problem includes:    Cyclobenzaprine Hcl 10 Mg Tabs (Cyclobenzaprine hcl) .Marland Kitchen... Take 1 tablet by mouth every night as needed for back/neck pain and spasm    Ibuprofen 800 Mg Tabs (Ibuprofen) ..... One three times a day as needed for pain  Orders: [facility]- Est  Level 4 (16109)  Problem # 2:  OBESITY, NOS (ICD-278.00) Assessment: Deteriorated pt would benefit from weight loss and daily exercise.  pt realizes she needs to lose weight, but not willing to attempt exercise program or diet therapy at this point.  readdress with pcp.   Orders: [facility]- Est  Level 4 (81191)   Problem # 3:  FIBROMYALGIA, FIBROMYOSITIS (ICD-729.1) Assessment: Deteriorated continue current medications, but maximize therapy.  multiple factors involved with her pain including fibromyalgia, depressed mood, stress, and neck strain.   Her updated medication list for this problem includes:    Cyclobenzaprine Hcl 10 Mg Tabs (Cyclobenzaprine hcl) .Marland Kitchen... Take 1 tablet by mouth every night as needed for back/neck pain and spasm    Ibuprofen 800 Mg Tabs (Ibuprofen) ..... One three times a day as needed for  pain  Orders: [facility]- Est  Level 4 (99214)   Complete Medication List: 1)  Advair Diskus 500-50 Mcg/dose Misc (Fluticasone-salmeterol) .... One puff twice daily 2)  Albuterol 90 Mcg/act Aers (Albuterol) .... Inhale 2 puff using inhaler every four hours 3)  Amitriptyline Hcl 25 Mg Tabs (Amitriptyline hcl) .... One tab three times daily 4)  Clonazepam 1 Mg Tabs (Clonazepam) .... Take 1 tablet by mouth twice a day 5)  Cyclobenzaprine Hcl 10 Mg Tabs (Cyclobenzaprine hcl) .... Take 1 tablet by mouth every night as needed for back/neck pain and spasm 6)  Fluconazole 100 Mg Tabs (Fluconazole) .Marland Kitchen.. 1 tablet by mouth once a day 7)  Fluoxetine Hcl 40 Mg Caps (Fluoxetine hcl) .... Two caps daily 8)  Hydrochlorothiazide 25 Mg Tabs (Hydrochlorothiazide) .... One by mouth daily 9)  Hydrocortisone 2.5 % Oint (Hydrocortisone) .... Apply a small amount to skin every night 10)  Lisinopril 10 Mg Tabs (Lisinopril) .... Take 1 tablet by mouth once a day 11)  Norethindrone Acetate 5 Mg Tabs (Norethindrone acetate) .... Take 1 tablet by mouth once a day 12)  Pepcid 20 Mg Tabs (Famotidine) .... One tab daily 13)  Simvastatin 20 Mg Tabs (Simvastatin) .... Take 1 tablet by mouth every night 14)  Trazodone Hcl 100 Mg Tabs (Trazodone hcl) .... Take 1 tablet by mouth at bedtime 15)  Ibuprofen 800 Mg Tabs (Ibuprofen) .... One three times a day as needed for pain   Patient Instructions: 1)  next 4 days, take flexeril 10mg  every 6 hours for muscle relaxant 2)  take ibuprofen 800mg  three times a day with food.  3)  use light heat (therapad heating disposable) to area on R shoulder, and perform strectches of neck and shoulder and while in the shower.   4)  Please schedule a follow-up appointment in 2 weeks with Dr. Leveda Anna to see how you are doing.      ]

## 2010-07-05 NOTE — Assessment & Plan Note (Signed)
Summary: lumps on neck wp   Vital Signs:  Patient Profile:   50 Years Old Female Weight:      340.13 pounds Temp:     98.6 degrees F Pulse rate:   87 / minute BP sitting:   155 / 104  (left arm)  Pt. in pain?   yes    Location:   back  Vitals Entered By: Theresia Lo RN (July 08, 2007 2:07 PM)              Is Patient Diabetic? No     PCP:  Doralee Albino MD  Chief Complaint:  back pain and rt arm swelling.  History of Present Illness: CO back, neck and shoulder pain.  No unusual activities - not sure why it has failed up.  Took ibuprofen 800 mg for several days and it helped.    Lots of personal stress.  $ as alway.  Also, son Gardiner Barefoot is being particularly difficult over food at this time.   Followed by mental health  Has been out of BP meds for several days  Current Allergies: AMOXICILLIN (AMOXICILLIN) DICLOFENAC SODIUM (DICLOFENAC SODIUM) IODINE (IODINE) PRILOSEC (OMEPRAZOLE) PROTONIX (PANTOPRAZOLE SODIUM)      Physical Exam  General:     Well-developed,well-nourished,in no acute distress; alert,appropriate and cooperative throughout examination.  In obvious pain Neck:     Dowanger's hump from obesity and poor posture.  Paraspinous muscle spasms. Chest Wall:     Pain along the medial and inferior border of the left scapula Lungs:     Normal respiratory effort, chest expands symmetrically. Lungs are clear to auscultation, no crackles or wheezes. Msk:     Both shoulders have pain on impingement test    Impression & Recommendations:  Problem # 1:  FIBROMYALGIA, FIBROMYOSITIS (ICD-729.1) With musculoskeletal pain of the cervical spine and shoulders - probable rotator cuff syndrome bilaterally.  In addition to meds, focused on postural exercises Her updated medication list for this problem includes:    Cyclobenzaprine Hcl 10 Mg Tabs (Cyclobenzaprine hcl) .Marland Kitchen... Take 1 tablet by mouth every night as needed for back/neck pain and spasm    Ibuprofen  800 Mg Tabs (Ibuprofen) ..... One three times a day as needed for pain  Orders: [facility]- Est  Level 4 (99214)   Problem # 2:  MALADJUSTMENT, SOCIAL (ICD-V62.4) Difficult situation.  Mental Health providing psych Rx.   Orders: [facility]- Est  Level 4 (10258)   Problem # 3:  HYPERTENSION, BENIGN SYSTEMIC (ICD-401.1) Poor control because out of meds.  Refilled meds Her updated medication list for this problem includes:    Hydrochlorothiazide 25 Mg Tabs (Hydrochlorothiazide) ..... One by mouth daily    Lisinopril 10 Mg Tabs (Lisinopril) .Marland Kitchen... Take 1 tablet by mouth once a day  Orders: [facility]- Est  Level 4 (99214)   Complete Medication List: 1)  Advair Diskus 500-50 Mcg/dose Misc (Fluticasone-salmeterol) .... One puff twice daily 2)  Albuterol 90 Mcg/act Aers (Albuterol) .... Inhale 2 puff using inhaler every four hours 3)  Amitriptyline Hcl 25 Mg Tabs (Amitriptyline hcl) .... One tab three times daily 4)  Clonazepam 1 Mg Tabs (Clonazepam) .... Take 1 tablet by mouth twice a day 5)  Cyclobenzaprine Hcl 10 Mg Tabs (Cyclobenzaprine hcl) .... Take 1 tablet by mouth every night as needed for back/neck pain and spasm 6)  Fluconazole 100 Mg Tabs (Fluconazole) .Marland Kitchen.. 1 tablet by mouth once a day 7)  Fluoxetine Hcl 40 Mg Caps (Fluoxetine hcl) .... Two caps  daily 8)  Hydrochlorothiazide 25 Mg Tabs (Hydrochlorothiazide) .... One by mouth daily 9)  Hydrocortisone 2.5 % Oint (Hydrocortisone) .... Apply a small amount to skin every night 10)  Lisinopril 10 Mg Tabs (Lisinopril) .... Take 1 tablet by mouth once a day 11)  Norethindrone Acetate 5 Mg Tabs (Norethindrone acetate) .... Take 1 tablet by mouth once a day 12)  Pepcid 20 Mg Tabs (Famotidine) .... One tab daily 13)  Simvastatin 20 Mg Tabs (Simvastatin) .... Take 1 tablet by mouth every night 14)  Trazodone Hcl 100 Mg Tabs (Trazodone hcl) .... Take 1 tablet by mouth at bedtime 15)  Ibuprofen 800 Mg Tabs (Ibuprofen) .... One three times a day as needed for  pain   Patient Instructions: 1)  Please schedule a follow-up appointment in 2 months.    Prescriptions: IBUPROFEN 800 MG  TABS (IBUPROFEN) one three times a day as needed for pain  #90 x 12   Entered and Authorized by:   Doralee Albino MD   Signed by:   Doralee Albino MD on 07/08/2007   Method used:   Electronically sent to ...       [facility] [address]       [address]       [facility]       [place], [place]  [address]       Ph: [phone]       Fax: [phone]   RxID:   [id number] SIMVASTATIN 20 MG TABS (SIMVASTATIN) Take 1 tablet by mouth every night  #30 x 12   Entered and Authorized by:   Doralee Albino MD   Signed by:   Doralee Albino MD on 07/08/2007   Method used:   Electronically sent to ...       [facility] [address]       [address]       [facility]       [place], [place]  [address]       Ph: [phone]       Fax: [phone]   RxID:   [id number] PEPCID 20 MG TABS (FAMOTIDINE) one tab daily  #30 x 12   Entered and Authorized by:   Doralee Albino MD   Signed by:   Doralee Albino MD on 07/08/2007   Method used:   Electronically sent to ...       [facility] [address]       [address]       [facility]       [place], [place]  [address]       Ph: [phone]       Fax: [phone]   RxID:   [id number] LISINOPRIL 10 MG TABS (LISINOPRIL) Take 1 tablet by mouth once a day  #30 x 12   Entered and Authorized by:   Doralee Albino MD   Signed by:   Doralee Albino MD on 07/08/2007   Method used:   Electronically sent to ...       [facility] [address]       [address] [place] [address]       [facility]       [place], [place]  [address]       Ph: [phone]       Fax: [phone]   RxID:   [id number] HYDROCHLOROTHIAZIDE 25 MG TABS (HYDROCHLOROTHIAZIDE) one by mouth daily  #30 x 12   Entered and Authorized by:   Chrissie Noa  Natividad Schlosser MD   Signed by:   Doralee Albino MD on 07/08/2007   Method  used:   Electronically sent to ...       [facility] [address]       [address]       [facility]       [place], [place]  [address]       Ph: [phone]       Fax: [phone]   RxID:   [id number] CYCLOBENZAPRINE HCL 10 MG TABS (CYCLOBENZAPRINE HCL) Take 1 tablet by mouth every night as needed for back/neck pain and spasm  #30 x 12   Entered and Authorized by:   Doralee Albino MD   Signed by:   Doralee Albino MD on 07/08/2007   Method used:   Electronically sent to ...       [facility] [address]       [address]       [facility]       [place], [place]  [address]       Ph: [phone]       Fax: [phone]   RxID:   [id number] AMITRIPTYLINE HCL 25 MG TABS (AMITRIPTYLINE HCL) one tab three times daily  #90 x 12   Entered and Authorized by:   Doralee Albino MD   Signed by:   Doralee Albino MD on 07/08/2007   Method used:   Electronically sent to ...       [facility] [address]       [address]       [facility]       [place], [place]  [address]       Ph: [phone]       Fax: [phone]   RxID:   [id number] ALBUTEROL 90 MCG/ACT AERS (ALBUTEROL) Inhale 2 puff using inhaler every four hours  #1 x 12   Entered and Authorized by:   Doralee Albino MD   Signed by:   Doralee Albino MD on 07/08/2007   Method used:   Electronically sent to ...       [facility] [address]       [address]       [facility]       [place], [place]  [address]       Ph: [phone]       Fax: [phone]   RxID:   [id number]  ]
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
## 2010-07-05 NOTE — Progress Notes (Signed)
Summary: requesting letter  Phone Note Call from Patient Call back at Home Phone [phone]   Reason for Call: Talk to Nurse Summary of Call: pt is requesting we fax a letter to [facility] stating pt was here yesterday, this is so pt can get a gas voucher. Fax to [phone] Initial call taken by: ERIN LEVAN,  March 21, 2007 9:42 AM  Follow-up for Phone Call        OK to provide letter.  Use work excuse template Follow-up by: Doralee Albino MD,  March 21, 2007 11:49 AM    Requested letter sent via fax to [facility] @ [phone] ..................Marland KitchenDelores Pate-Gaddy, CMA Duncan Dull) March 21, 2007 12:39 PM      Appended Document: requesting letter calling back, she sts it needs to go to [phone]

## 2010-07-05 NOTE — Assessment & Plan Note (Signed)
Summary: asthma & congestion/[place]   Vital Signs:  Patient profile:   50 year old female Weight:      374.5 pounds Temp:     99.1 degrees F oral Pulse rate:   92 / minute Pulse rhythm:   regular BP sitting:   116 / 78  (left arm) Cuff size:   large  Vitals Entered By: Loralee Pacas CMA (March 01, 2010 11:23 AM)  Serial Vital Signs/Assessments:                                PEF    PreRx  PostRx Time      O2 Sat  O2 Type     L/min  L/min  L/min   By 11:25 AM                             220            Loralee Pacas CMA  CC: congestion and asthma Is Patient Diabetic? No   Primary Care Provider:  Doralee Albino MD  CC:  congestion and asthma.  History of Present Illness: URI Symptoms Onset: Several days ago. Description: Cough, nasal drip, runny nose.  Compliant with her QVAR and albuterol.  Symptoms Nasal discharge: Clear Fever: Low grade Sore throat: mild Cough: yes, dry Wheezing: No Ear pain: No GI symptoms: No Sick contacts: No  Red Flags  Stiff neck: No Dyspnea: No Rash: No Swallowing difficulty: No  Sinusitis Risk Factors Headache/face pain: No Double sickening: No tooth pain: No  Allergy Risk Factors Sneezing: No Itchy scratchy throat: No Seasonal symptoms: No  Flu Risk Factors Headache: No muscle aches: No severe fatigue: No    Habits & Providers  Alcohol-Tobacco-Diet     Alcohol drinks/day: 0     Tobacco Status: never     Passive Smoke Exposure: yes  Current Medications (verified): 1)  Qvar 80 Mcg/act Aers (Beclomethasone Dipropionate) .... Two Puffs Daily 2)  Proventil Hfa 108 (90 Base) Mcg/act Aers (Albuterol Sulfate) .... 2 Puffs Q4h As Needed Wheezing 3)  Amitriptyline Hcl 25 Mg Tabs (Amitriptyline Hcl) .... One Tab Three Times Daily 4)  Clonazepam 1 Mg Tabs (Clonazepam) .... Take 1 Tablet By Mouth Twice A Day 5)  Cyclobenzaprine Hcl 10 Mg Tabs (Cyclobenzaprine Hcl) .... Take 1 Tablet By Mouth Every Night As Needed For  Back/neck Pain and Spasm 6)  Fluconazole 100 Mg Tabs (Fluconazole) .Marland Kitchen.. 1 Tablet By Mouth Once A Day 7)  Fluoxetine Hcl 40 Mg  Caps (Fluoxetine Hcl) .... Two Caps Daily 8)  Hydrochlorothiazide 25 Mg Tabs (Hydrochlorothiazide) .... One By Mouth Daily 9)  Norethindrone Acetate 5 Mg Tabs (Norethindrone Acetate) .... Take 1 Tablet By Mouth Once A Day 10)  Pepcid 20 Mg Tabs (Famotidine) .... One Tab Daily 11)  Simvastatin 80 Mg Tabs (Simvastatin) .Marland Kitchen.. 1 By Mouth At Bedtime 12)  Trazodone Hcl 100 Mg Tabs (Trazodone Hcl) .... Take 1 Tablet By Mouth At Bedtime 13)  Ibuprofen 800 Mg  Tabs (Ibuprofen) .... One Three Times A Day As Needed For Pain 14)  Lisinopril 40 Mg Tabs (Lisinopril) .... One By Mouth Daily 15)  Triamcinolone Acetonide 0.1 % Oint (Triamcinolone Acetonide) .... Apply To Affected Area Two Times A Day Disp 30 Gram Tube 16)  Fluticasone Propionate 50 Mcg/act Susp (Fluticasone Propionate) .... 2 Puffs Each Nostril Daily.  Use Even When Nose  Not Stuffy. 17)  Nasaflo Neti Pot Nasal Wash  Pack (Hypertonic Nasal Wash) .... Use 2-3x A Day For Nasal Symptoms. 18)  Tessalon 200 Mg Caps (Benzonatate) .... One Tab By Mouth Three Times A Day As Needed For Coughing. 19)  Tylenol Cold Multi-Symptom Day 10-5-325 Mg Tabs (Dm-Phenylephrine-Acetaminophen) .... One Tab By Mouth 2-3x A Day For Cough, Nasal Drip.  Allergies (verified): 1)  ! Etodolac (Etodolac) 2)  Amoxicillin (Amoxicillin) 3)  Diclofenac Sodium (Diclofenac Sodium) 4)  Iodine (Iodine) 5)  Prilosec (Omeprazole) 6)  Protonix (Pantoprazole Sodium)  Review of Systems       See HPI  Physical Exam  General:  Well-developed,well-nourished,in no acute distress; alert,appropriate and cooperative throughout examination Head:  Normocephalic and atraumatic without obvious abnormalities.  Eyes:  No corneal or conjunctival inflammation noted. EOMI. Perrla. Ears:  External ear exam shows no significant lesions or deformities.  Otoscopic  examination reveals clear canals, tympanic membranes are intact bilaterally without bulging, retraction, inflammation or discharge. Hearing is grossly normal bilaterally. Nose:  External nasal examination shows no deformity or inflammation. Nasal mucosa are pink and moist without lesions or exudates.  Mucosae is boggy around turbinates. Mouth:  Oral mucosa and oropharynx without lesions or exudates.  Neck:  No deformities, masses, or tenderness noted. Lungs:  Normal respiratory effort, chest expands symmetrically. Lungs are clear to auscultation, no crackles or wheezes. Heart:  Normal rate and regular rhythm. S1 and S2 normal without gallop, murmur, click, rub or other extra sounds. Abdomen:  Bowel sounds positive,abdomen soft and non-tender without masses, organomegaly or hernias noted. Additional Exam:  PF improved to 350 with improved effort and having pt seal her lips around meter.  Predicted: 460.   Impression & Recommendations:  Problem # 1:  POSTNASAL DRIP SYNDROME (ICD-784.91) Assessment New Will break cough/irritation/cough cycle with: Tessalon perles Tylenol Cold. Can try Neti-pot for symptomatic relief. RTC if not getting any better in 2 weeks. Handout given. No signs of wheeze, consolidation, or other lower respiratory process.  Orders: [facility]- Est Level  3 (16109)  Complete Medication List: 1)  Qvar 80 Mcg/act Aers (Beclomethasone dipropionate) .... Two puffs daily 2)  Proventil Hfa 108 (90 Base) Mcg/act Aers (Albuterol sulfate) .... 2 puffs q4h as needed wheezing 3)  Amitriptyline Hcl 25 Mg Tabs (Amitriptyline hcl) .... One tab three times daily 4)  Clonazepam 1 Mg Tabs (Clonazepam) .... Take 1 tablet by mouth twice a day 5)  Cyclobenzaprine Hcl 10 Mg Tabs (Cyclobenzaprine hcl) .... Take 1 tablet by mouth every night as needed for back/neck pain and spasm 6)  Fluconazole 100 Mg Tabs (Fluconazole) .Marland Kitchen.. 1 tablet by mouth once a day 7)  Fluoxetine Hcl 40 Mg Caps (Fluoxetine  hcl) .... Two caps daily 8)  Hydrochlorothiazide 25 Mg Tabs (Hydrochlorothiazide) .... One by mouth daily 9)  Norethindrone Acetate 5 Mg Tabs (Norethindrone acetate) .... Take 1 tablet by mouth once a day 10)  Pepcid 20 Mg Tabs (Famotidine) .... One tab daily 11)  Simvastatin 80 Mg Tabs (Simvastatin) .Marland Kitchen.. 1 by mouth at bedtime 12)  Trazodone Hcl 100 Mg Tabs (Trazodone hcl) .... Take 1 tablet by mouth at bedtime 13)  Ibuprofen 800 Mg Tabs (Ibuprofen) .... One three times a day as needed for pain 14)  Lisinopril 40 Mg Tabs (Lisinopril) .... One by mouth daily 15)  Triamcinolone Acetonide 0.1 % Oint (Triamcinolone acetonide) .... Apply to affected area two times a day disp 30 gram tube 16)  Fluticasone Propionate 50 Mcg/act Susp (Fluticasone  propionate) .... 2 puffs each nostril daily.  use even when nose not stuffy. 17)  Nasaflo Neti Pot Nasal Wash Pack (Hypertonic nasal wash) .... Use 2-3x a day for nasal symptoms. 18)  Tessalon 200 Mg Caps (Benzonatate) .... One tab by mouth three times a day as needed for coughing. 19)  Tylenol Cold Multi-symptom Day 10-5-325 Mg Tabs (Dm-phenylephrine-acetaminophen) .... One tab by mouth 2-3x a day for cough, nasal drip. Prescriptions: TYLENOL COLD MULTI-SYMPTOM DAY 10-5-325 MG TABS (DM-PHENYLEPHRINE-ACETAMINOPHEN) One tab by mouth 2-3x a day for cough, nasal drip.  #30 x 0   Entered and Authorized by:   Rodney Langton MD   Signed by:   Rodney Langton MD on 03/01/2010   Method used:   Print then Give to Patient   RxID:   [id number] TESSALON 200 MG CAPS (BENZONATATE) One tab by mouth three times a day as needed for coughing.  #30 x 0   Entered and Authorized by:   Rodney Langton MD   Signed by:   Rodney Langton MD on 03/01/2010   Method used:   Print then Give to Patient   RxID:   [id number] NASAFLO NETI POT NASAL WASH  PACK (HYPERTONIC NASAL WASH) Use 2-3x a day for nasal symptoms.  #1 pack x 0   Entered and Authorized by:    Rodney Langton MD   Signed by:   Rodney Langton MD on 03/01/2010   Method used:   Print then Give to Patient   RxID:   [id number]

## 2010-07-05 NOTE — Progress Notes (Signed)
Summary: medication update  Phone Note Call from Patient Call back at Home Phone [phone]   Reason for Call: Talk to Doctor Summary of Call: pt sts she was given an rx for abilify from mental health but pt didn't want to take it b/c it has the same effects as lamictal and she doesn't want to go through the same effects. pt would like to speak with Dr. Leveda Anna to be sure this was ok so she can tell mental health.  Initial call taken by: ERIN LEVAN,  April 01, 2007 2:15 PM  Follow-up for Phone Call        Called and LM.  She should speak directly to mental health to figure out the best med regimine for her Follow-up by: Doralee Albino MD,  April 02, 2007 12:27 PM

## 2010-07-05 NOTE — Letter (Signed)
Summary: SS Disability Evaluation  Historic Patient Records   Imported By: Haydee Salter 11/05/2006 12:13:14  _____________________________________________________________________  External Attachment:    Type:   Image     Comment:   External Document

## 2010-07-05 NOTE — Progress Notes (Signed)
Summary: triage  Phone Note Call from Patient Call back at Home Phone [phone]   Reason for Call: Talk to Nurse Summary of Call: pt sts she was having a reaction to lamactil and went to [facility] and they wanted her to call us, pt sts she is shaking all over, her bloodpressure is 136-77, pt requesting to speak with rn Initial call taken by: ERIN LEVAN,  March 26, 2007 3:49 PM  Follow-up for Phone Call         pt reports she started lamictil 6-7 weeks ago by [facility] .then rx was increased to 200 mg daily at bedtime. after this increase she started shaking and on 03/22/07 she was instructed to decrease to 100 mg daily. she continued to shake and today she was instructed to stop lamictal and start Abilify 5 mg daily. when she went to pick up the med she was shaking so bad they wanted to send her to ER but she had to get home to pick up kids .states she has had blurring of vision and can't seem to think straight for past 2 weeks. suggested she go to an urgent care or ER tonight to be evaluated and them follow here with pcp. pt agrees. Follow-up by: Theresia Lo RN,  March 26, 2007 4:25 PM         Appended Document: triage Agree  Appended Document: triage Is requesting to speak with someone about this issue.  Phone number [phone].  Appended Document: triage appt today to be evaluated for her persistant shaking. did pick up the abilify & will start tonight

## 2010-07-05 NOTE — Progress Notes (Signed)
Summary: requesting we fax to [facility]  Phone Note Call from Patient Call back at Home Phone [phone]   Reason for Call: Talk to Nurse Summary of Call: requesting we fax something to [facility] stating pt was here so she can get a gas voucher, fax to [phone] Initial call taken by: ERIN LEVAN,  April 29, 2007 4:07 PM    called pt. will fax it.....................................................................Kelsey Bullock CMA,  April 29, 2007 4:42 PM

## 2010-07-05 NOTE — Miscellaneous (Signed)
Summary: AB OTic  Clinical Lists Changes pharmacy needs clarification on how many drops to be used. to pcp. call [phone] or resend e rx.Golden Circle RN  March 12, 2009 1:09 PM  clarified Myrtie Soman  MD  March 12, 2009 1:26 PM

## 2010-07-05 NOTE — Progress Notes (Signed)
Summary: Triage  Phone Note Call from Patient Call back at Home Phone [phone]   Caller: Patient Summary of Call: thinks she has a urinary tract infection and also has a cold.  Her daughter also has a cold and would like both of them seen.  Kelsey Bullock [birth year].   Initial call taken by: Clydell Hakim,  Oct 12, 2008 8:35 AM  Follow-up for Phone Call        left message Follow-up by: Golden Circle RN,  Oct 12, 2008 8:51 AM  Additional Follow-up for Phone Call Additional follow up Details #1::        they will both be here at 11am work in [place]. child has bad sore throat as well as other cold s/s Additional Follow-up by: Golden Circle RN,  Oct 12, 2008 9:59 AM    Additional Follow-up for Phone Call Additional follow up Details #2::    noted and agree with plan. Follow-up by: Doralee Albino MD,  Oct 12, 2008 10:48 AM

## 2010-07-05 NOTE — Assessment & Plan Note (Signed)
Summary: fu/kh   Vital Signs:  Patient profile:   50 year old female Weight:      374.7 pounds BMI:     63.55 Temp:     98.7 degrees F Pulse rate:   75 / minute BP sitting:   156 / 82  (left arm)  Vitals Entered By: Theresia Lo RN (March 10, 2009 3:33 PM) CC: follow up Is Patient Diabetic? No Pain Assessment Patient in pain? yes     Location: back and knees Intensity: 8 Type: aching   Primary Care Provider:  Doralee Albino MD  CC:  follow up.  History of Present Illness: Cough.  No fever - does have DOE.  Wonders if asthma is the problem.  Uses advair regularly  Financial stress  Will get check for sleep apnea  Note 11 lb weight gain since last visit  Snores loudly.  Excessive daytime sleepiness Sibling with sleep apnea.  Habits & Providers  Alcohol-Tobacco-Diet     Alcohol drinks/day: 0     Tobacco Status: never  Exercise-Depression-Behavior     Does Patient Exercise: no     Exercise Counseling: to improve exercise regimen  Current Medications (verified): 1)  Qvar 80 Mcg/act Aers (Beclomethasone Dipropionate) .... Two Puffs Daily 2)  Proventil Hfa 108 (90 Base) Mcg/act Aers (Albuterol Sulfate) .... 2 Puffs Q4h As Needed Wheezing 3)  Amitriptyline Hcl 25 Mg Tabs (Amitriptyline Hcl) .... One Tab Three Times Daily 4)  Clonazepam 1 Mg Tabs (Clonazepam) .... Take 1 Tablet By Mouth Twice A Day 5)  Cyclobenzaprine Hcl 10 Mg Tabs (Cyclobenzaprine Hcl) .... Take 1 Tablet By Mouth Every Night As Needed For Back/neck Pain and Spasm 6)  Fluconazole 100 Mg Tabs (Fluconazole) .Marland Kitchen.. 1 Tablet By Mouth Once A Day 7)  Fluoxetine Hcl 40 Mg  Caps (Fluoxetine Hcl) .... Two Caps Daily 8)  Hydrochlorothiazide 25 Mg Tabs (Hydrochlorothiazide) .... One By Mouth Daily 9)  Lisinopril 20 Mg Tabs (Lisinopril) .Marland Kitchen.. 1 Tablet By Mouth Daily 10)  Norethindrone Acetate 5 Mg Tabs (Norethindrone Acetate) .... Take 1 Tablet By Mouth Once A Day 11)  Pepcid 20 Mg Tabs (Famotidine) .... One  Tab Daily 12)  Simvastatin 80 Mg Tabs (Simvastatin) .Marland Kitchen.. 1 By Mouth At Bedtime 13)  Trazodone Hcl 100 Mg Tabs (Trazodone Hcl) .... Take 1 Tablet By Mouth At Bedtime 14)  Ibuprofen 800 Mg  Tabs (Ibuprofen) .... One Three Times A Day As Needed For Pain 15)  Lisinopril 40 Mg Tabs (Lisinopril) .... One By Mouth Daily  Allergies (verified): 1)  ! Etodolac (Etodolac) 2)  Amoxicillin (Amoxicillin) 3)  Diclofenac Sodium (Diclofenac Sodium) 4)  Iodine (Iodine) 5)  Prilosec (Omeprazole) 6)  Protonix (Pantoprazole Sodium)  Past History:  Past medical, surgical, family and social histories (including risk factors) reviewed, and no changes noted (except as noted below).  Past Medical History: Reviewed history from 07/31/2007 and no changes required. gestational diabetes -  at risk for DM Type II fibromyalgia  Past Surgical History: Reviewed history from 07/31/2007 and no changes required. Rt carpal tunnel release - 12/04/2002    Family History: Reviewed history from 07/31/2007 and no changes required. - CVA, + osteoporosis, CAD, Ca, DM, EtOHism, high Chol    Social History: Reviewed history from 10/30/2008 and no changes required. Now permenantly disabled secondary to depression; Has an old Zenaida Niece for transportation; non smoker, non drinker; lives with children age 51,4, and 59 (in 2004) father of children has left the family   Trying to  eat healthier Exercise only walking with errands.   Does Patient Exercise:  no  Physical Exam  General:  Well-developed,obese ,in no acute distress; alert,appropriate and cooperative throughout examination Neck:  No deformities, masses, or tenderness noted. Lungs:  Rhonchi no wheeze.  Does have restricted lung capacity due to body habitus.   Impression & Recommendations:  Problem # 1:  ASTHMA NOS W/O STATUS ASTHMATICUS (ICD-493.90) Also has a componant of restrictive lung disease.  Strongly emphasized wt loss. Her updated medication list for this problem  includes:    Qvar 80 Mcg/act Aers (Beclomethasone dipropionate) .Marland Kitchen..Marland Kitchen Two puffs daily    Proventil Hfa 108 (90 Base) Mcg/act Aers (Albuterol sulfate) .Marland Kitchen... 2 puffs q4h as needed wheezing  Orders: [facility]- Est  Level 4 (16109)  Problem # 2:  SLEEP APNEA (ICD-780.57) Assessment: New Test Orders: Sleep Disorder Referral (Sleep Disorder) [facility]- Est  Level 4 (60454)  Problem # 3:  HYPERTENSION, BENIGN SYSTEMIC (ICD-401.1) Increase lisinopril Her updated medication list for this problem includes:    Hydrochlorothiazide 25 Mg Tabs (Hydrochlorothiazide) ..... One by mouth daily    Lisinopril 20 Mg Tabs (Lisinopril) .Marland Kitchen... 1 tablet by mouth daily    Lisinopril 40 Mg Tabs (Lisinopril) ..... One by mouth daily  Orders: [facility]- Est  Level 4 (09811)  Problem # 4:  OBESITY, NOS (ICD-278.00) Assessment: Deteriorated Emphasized wt loss  Problem # 5:  MALADJUSTMENT, SOCIAL (ICD-V62.4) Housing stable but financial stressors high. Orders: [facility]- Est  Level 4 (99214)  Complete Medication List: 1)  Qvar 80 Mcg/act Aers (Beclomethasone dipropionate) .... Two puffs daily 2)  Proventil Hfa 108 (90 Base) Mcg/act Aers (Albuterol sulfate) .... 2 puffs q4h as needed wheezing 3)  Amitriptyline Hcl 25 Mg Tabs (Amitriptyline hcl) .... One tab three times daily 4)  Clonazepam 1 Mg Tabs (Clonazepam) .... Take 1 tablet by mouth twice a day 5)  Cyclobenzaprine Hcl 10 Mg Tabs (Cyclobenzaprine hcl) .... Take 1 tablet by mouth every night as needed for back/neck pain and spasm 6)  Fluconazole 100 Mg Tabs (Fluconazole) .Marland Kitchen.. 1 tablet by mouth once a day 7)  Fluoxetine Hcl 40 Mg Caps (Fluoxetine hcl) .... Two caps daily 8)  Hydrochlorothiazide 25 Mg Tabs (Hydrochlorothiazide) .... One by mouth daily 9)  Lisinopril 20 Mg Tabs (Lisinopril) .Marland Kitchen.. 1 tablet by mouth daily 10)  Norethindrone Acetate 5 Mg Tabs (Norethindrone acetate) .... Take 1 tablet by mouth once a day 11)  Pepcid 20 Mg Tabs (Famotidine) .... One tab daily 12)   Simvastatin 80 Mg Tabs (Simvastatin) .Marland Kitchen.. 1 by mouth at bedtime 13)  Trazodone Hcl 100 Mg Tabs (Trazodone hcl) .... Take 1 tablet by mouth at bedtime 14)  Ibuprofen 800 Mg Tabs (Ibuprofen) .... One three times a day as needed for pain 15)  Lisinopril 40 Mg Tabs (Lisinopril) .... One by mouth daily  Other Orders: Flu Vaccine 78yrs + [phone]) Admin 1st Vaccine (29562) Admin 1st Vaccine [facility]) [phone])  Patient Instructions: 1)  Take two of the lisinopril daily until you pick up your new prescription.   2)  You must work harder on losing wt.  This is a matter of life and death for you. Prescriptions: LISINOPRIL 40 MG TABS (LISINOPRIL) one by mouth daily  #30 x 12   Entered and Authorized by:   Doralee Albino MD   Signed by:   Doralee Albino MD on 03/10/2009   Method used:   Electronically to        [facility]. [address] [phone]* (retail)  [address]       [place], [place]  [address]       Ph: [phone] or [phone]       Fax: [phone]   RxID:   [id number]    Prevention & Chronic Care Immunizations   Influenza vaccine: Fluvax 3+  (03/10/2009)   Influenza vaccine due: 03/05/2009    Tetanus booster: 10/11/2006: Tdap   Tetanus booster due: 10/10/2016    Pneumococcal vaccine: Pneumovax  (04/29/2007)   Pneumococcal vaccine due: None  Other Screening   Pap smear: normal  (10/30/2007)   Pap smear due: 10/29/2008    Mammogram: normal  (05/13/2008)   Mammogram due: 05/13/2009   Smoking status: never  (03/10/2009)  Lipids   Total Cholesterol: 249  (10/30/2008)   LDL: 140  (10/30/2008)   LDL Direct: Not documented   HDL: 40  (10/30/2008)   Triglycerides: 346  (10/30/2008)    SGOT (AST): 12  (10/30/2008)   SGPT (ALT): 12  (10/30/2008)   Alkaline phosphatase: 86  (10/30/2008)   Total bilirubin: 0.4  (10/30/2008)    Lipid flowsheet reviewed?: Yes   Progress toward LDL goal: Unchanged  Hypertension   Last Blood Pressure: 156 / 82   (03/10/2009)   Serum creatinine: 0.80  (10/30/2008)   Serum potassium 4.2  (10/30/2008)    Hypertension flowsheet reviewed?: Yes   Progress toward BP goal: Improved  Self-Management Support :   Personal Goals (by the next clinic visit) :      Personal blood pressure goal: 140/90  (03/10/2009)     Personal LDL goal: 130  (03/10/2009)    Hypertension self-management support: Written self-care plan  (03/10/2009)   Hypertension self-care plan printed.    Lipid self-management support: Written self-care plan  (03/10/2009)   Lipid self-care plan printed.   Nursing Instructions: Give Flu vaccine today      Influenza Vaccine    Vaccine Type: Fluvax 3+    Site: right deltoid    Mfr: [email or web address]    Dose: 0.5 ml    Route: IM    Given by: Theresia Lo RN    Exp. Date: 12/02/2009    Lot #: [id number]    VIS given: 01/12/2009  Flu Vaccine Consent Questions    Do you have a history of severe allergic reactions to this vaccine? no    Any prior history of allergic reactions to egg and/or gelatin? no    Do you have a sensitivity to the preservative Thimersol? no    Do you have a past history of Guillan-Barre Syndrome? no    Do you currently have an acute febrile illness? no    Have you ever had a severe reaction to latex? no    Vaccine information given and explained to patient? yes    Are you currently pregnant? no

## 2010-07-05 NOTE — Assessment & Plan Note (Signed)
Summary: F/U VISIT/[facility]   Vital Signs:  Patient Profile:   50 Years Old Female Height:     65 inches Weight:      350.1 pounds BMI:     58.47 Pulse rate:   97 / minute BP sitting:   136 / 85  (right arm)  Pt. in pain?   yes    Location:   right hand    Intensity:   7  Vitals Entered By: Renato Battles slade,cma              Is Patient Diabetic? No     PCP:  Doralee Albino MD  Chief Complaint:  regular visit. weight/BP.  History of Present Illness: Obesity.  Wt is up today.  Spoke with ObGyn MD who was concerned - northindrone can cause weight gain, but she needs for menorrhagia  Problems with Rt hand.  Ring finger gets stuck in flexion.  Large finger also gets stuck occaisionally.  Shooting pains in hand and forearm  Seems up to date on prevention.    Wants letter from me for ac at public housing.  Given copy of letter from last year.  Flu Vaccine Result Date:  03/20/2007 Flu Vaccine Result:  given Flu Vaccine Next Due:  1 yr Last PAP:  Done. (10/03/2005 12:00:00 AM) PAP Result Date:  10/30/2007 PAP Result:  normal PAP Next Due:  1 yr    Current Allergies: ! ETODOLAC (ETODOLAC) AMOXICILLIN (AMOXICILLIN) DICLOFENAC SODIUM (DICLOFENAC SODIUM) IODINE (IODINE) PRILOSEC (OMEPRAZOLE) PROTONIX (PANTOPRAZOLE SODIUM)    Risk Factors:  PAP Smear History:     Date of Last PAP Smear:  10/30/2007   PAP Smear History:     Date of Last PAP Smear:  10/30/2007    Results:  normal   PAP Smear History:     Date of Last PAP Smear:  10/30/2007   PAP Smear History:     Date of Last PAP Smear:  10/30/2007    Results:  normal       Impression & Recommendations:  Problem # 1:  OBESITY, NOS (ICD-278.00) Once again recommended diet and exercise.  Also briefly discussed bariatric surg  Problem # 2:  HYPERTENSION, BENIGN SYSTEMIC (ICD-401.1) Assessment: Improved  Her updated medication list for this problem includes:    Hydrochlorothiazide 25 Mg Tabs  (Hydrochlorothiazide) ..... One by mouth daily    Lisinopril 10 Mg Tabs (Lisinopril) .Marland Kitchen... Take 1 tablet by mouth once a day  Orders: Basic Met-[facility] ([address]-60454) [facility]- Est  Level 4 ([address]11)   Problem # 3:  CARPAL TUNNEL SYNDROME (ICD-354.0) Seems to have a trigger finger element.  Pain is probably multifactoreal.  Has appointment to see ortho Orders: TSH-[facility] ([id number]) [facility]- Est  Level 4 (13086)   Problem # 4:  MENSTRUATION, PAINFUL (ICD-625.3)  Orders: CBC-[facility] ([address]) [facility]- Est  Level 4 (96295)   Complete Medication List: 1)  Advair Diskus 500-50 Mcg/dose Misc (Fluticasone-salmeterol) .... One puff twice daily 2)  Albuterol 90 Mcg/act Aers (Albuterol) .... Inhale 2 puff using inhaler every four hours 3)  Amitriptyline Hcl 25 Mg Tabs (Amitriptyline hcl) .... One tab three times daily 4)  Clonazepam 1 Mg Tabs (Clonazepam) .... Take 1 tablet by mouth twice a day 5)  Cyclobenzaprine Hcl 10 Mg Tabs (Cyclobenzaprine hcl) .... Take 1 tablet by mouth every night as needed for back/neck pain and spasm 6)  Fluconazole 100 Mg Tabs (Fluconazole) .Marland Kitchen.. 1 tablet by mouth once a day 7)  Fluoxetine Hcl 40 Mg Caps (Fluoxetine hcl) .Marland KitchenMarland KitchenMarland Kitchen  Two caps daily 8)  Hydrochlorothiazide 25 Mg Tabs (Hydrochlorothiazide) .... One by mouth daily 9)  Lisinopril 10 Mg Tabs (Lisinopril) .... Take 1 tablet by mouth once a day 10)  Norethindrone Acetate 5 Mg Tabs (Norethindrone acetate) .... Take 1 tablet by mouth once a day 11)  Pepcid 20 Mg Tabs (Famotidine) .... One tab daily 12)  Simvastatin 20 Mg Tabs (Simvastatin) .... Take 1 tablet by mouth every night 13)  Trazodone Hcl 100 Mg Tabs (Trazodone hcl) .... Take 1 tablet by mouth at bedtime 14)  Ibuprofen 800 Mg Tabs (Ibuprofen) .... One three times a day as needed for pain 15)  Geodon 40 Mg Caps (Ziprasidone hcl) .... One by mouth with supper as needed   Patient Instructions: 1)  I will call with lab results   ]

## 2010-07-05 NOTE — Progress Notes (Signed)
Summary: rx prob  Phone Note Call from Patient Call back at Home Phone [phone]   Caller: Patient Summary of Call: Pts medicaid will not be valid until 30 days and until then she can't get her medicine.  Do we have samples or can we suggest anything we can do.  Was seen yesterday. Initial call taken by: Clydell Hakim,  Oct 13, 2008 2:44 PM  Follow-up for Phone Call        ok we can give her cipro 250 two times a day for 5 days and  that should be $4 at [facility] plz let her know I will call in as below Thanks!  Denny Levy MD  Oct 13, 2008 3:17 PM     New/Updated Medications: CIPRO 250 MG TABS (CIPROFLOXACIN HCL) 1 by mouth two times a day generic   Prescriptions: CIPRO 250 MG TABS (CIPROFLOXACIN HCL) 1 by mouth two times a day generic  #10 x 0   Entered and Authorized by:   Denny Levy MD   Signed by:   Denny Levy MD on 10/13/2008   Method used:   Electronically to        [facility] (retail)       [address]       [facility]       [place], [place]  [address]       Ph: [phone]       Fax: [phone]   RxID:   [id number]   Appended Document: rx prob above rx cancelled at [facility] and  Rx sent to [facility]. patient notified.

## 2010-07-05 NOTE — Miscellaneous (Signed)
Summary: Chiropractor  Clinical Lists Changes Spoke with Dennie Bible from [employer] in [place] regarding [employer] for this pts visits.  Pt has been seeing them prior to coming to [facility] and has received authorizations from Dr Leveda Anna before.  After reviewing patients paper chart, returned call to their office and gave 70yr auth for pt to continue visits.  Will confer further with Dr. Leveda Anna on this matter. BARBARA MCGREGOR  June 26, 2008 9:44 AM

## 2010-07-05 NOTE — Miscellaneous (Signed)
Summary: dnka/ts  Clinical Lists Changes 

## 2010-07-05 NOTE — Progress Notes (Signed)
Summary: [place] request  Phone Note Call from Patient Call back at [phone]   Reason for Call: Talk to Nurse Summary of Call: pt is needing to be seen today for cough Initial call taken by: Haydee Salter,  September 04, 2006 9:51 AM  Follow-up for Phone Call       Follow-up by: Golden Circle RN,  September 04, 2006 9:56 AM    appt made. pt has been increasingly worse over past week. [place] questions [place]

## 2010-07-05 NOTE — Progress Notes (Signed)
Summary: Status of form  Phone Note Other Incoming Call back at [phone] ext [phone]   Call placed by: [employer] Summary of Call: Is checking status of form for power wheelchair. Reference number [id number]. Initial call taken by: Haydee Salter,  July 24, 2007 10:37 AM  Follow-up for Phone Call        called company and they state the form was mailed on 07/15/07 and they received a confirmation that it was delivered here 07/17/07. RN unable to locate form or any documentation about form. will send message to Dr. Leveda Anna to ask if he knows anything about the form.  If not will call company to mail again. Follow-up by: Theresia Lo RN,  July 24, 2007 11:49 AM  Additional Follow-up for Phone Call Additional follow up Details #1::        motorized wheelchair denied.  She does not medically qualify Additional Follow-up by: Doralee Albino MD,  July 24, 2007 4:18 PM    Additional Follow-up for Phone Call Additional follow up Details #2::    [employer] notified of above message . Follow-up by: Theresia Lo RN,  July 25, 2007 9:35 AM

## 2010-07-05 NOTE — Assessment & Plan Note (Signed)
Summary: Medicaid formulary change

## 2010-07-05 NOTE — Assessment & Plan Note (Signed)
Summary: fu/kh   Vital Signs:  Patient profile:   50 year old female Weight:      374.6 pounds BMI:     63.54 Temp:     97.8 degrees F oral Pulse rate:   103 / minute BP sitting:   108 / 72  (right arm) Cuff size:   large  Vitals Entered By: Loralee Pacas CMA (July 23, 2009 2:17 PM) Comments chest cold,? left ear drum busted   Primary Care Provider:  Doralee Albino MD   History of Present Illness: Wt down 1 lb BP is good today C/O ruptured Lt eardrum.  Chronic nasal congestion - worse over past 3 weeks. Several skin lesions are a concern.  See Physical exam section for history of exam description of lesions.  Current Medications (verified): 1)  Qvar 80 Mcg/act Aers (Beclomethasone Dipropionate) .... Two Puffs Daily 2)  Proventil Hfa 108 (90 Base) Mcg/act Aers (Albuterol Sulfate) .... 2 Puffs Q4h As Needed Wheezing 3)  Amitriptyline Hcl 25 Mg Tabs (Amitriptyline Hcl) .... One Tab Three Times Daily 4)  Clonazepam 1 Mg Tabs (Clonazepam) .... Take 1 Tablet By Mouth Twice A Day 5)  Cyclobenzaprine Hcl 10 Mg Tabs (Cyclobenzaprine Hcl) .... Take 1 Tablet By Mouth Every Night As Needed For Back/neck Pain and Spasm 6)  Fluconazole 100 Mg Tabs (Fluconazole) .Marland Kitchen.. 1 Tablet By Mouth Once A Day 7)  Fluoxetine Hcl 40 Mg  Caps (Fluoxetine Hcl) .... Two Caps Daily 8)  Hydrochlorothiazide 25 Mg Tabs (Hydrochlorothiazide) .... One By Mouth Daily 9)  Lisinopril 20 Mg Tabs (Lisinopril) .Marland Kitchen.. 1 Tablet By Mouth Daily 10)  Norethindrone Acetate 5 Mg Tabs (Norethindrone Acetate) .... Take 1 Tablet By Mouth Once A Day 11)  Pepcid 20 Mg Tabs (Famotidine) .... One Tab Daily 12)  Simvastatin 80 Mg Tabs (Simvastatin) .Marland Kitchen.. 1 By Mouth At Bedtime 13)  Trazodone Hcl 100 Mg Tabs (Trazodone Hcl) .... Take 1 Tablet By Mouth At Bedtime 14)  Ibuprofen 800 Mg  Tabs (Ibuprofen) .... One Three Times A Day As Needed For Pain 15)  Lisinopril 40 Mg Tabs (Lisinopril) .... One By Mouth Daily 16)  Triamcinolone  Acetonide 0.1 % Oint (Triamcinolone Acetonide) .... Apply To Affected Area Two Times A Day Disp 30 Gram Tube 17)  Guaifenesin-Codeine 200-10 Mg/39ml Liqd (Guaifenesin-Codeine) .... Take One By Mouth Q4-6 Hours As Needed Cough 18)  Fluticasone Propionate 50 Mcg/act Susp (Fluticasone Propionate) .... 2 Puffs Each Nostril Daily.  Use Even When Nose Not Stuffy.  Allergies (verified): 1)  ! Etodolac (Etodolac) 2)  Amoxicillin (Amoxicillin) 3)  Diclofenac Sodium (Diclofenac Sodium) 4)  Iodine (Iodine) 5)  Prilosec (Omeprazole) 6)  Protonix (Pantoprazole Sodium)  Past History:  Past medical, surgical, family and social histories (including risk factors) reviewed, and no changes noted (except as noted below).  Past Medical History: Reviewed history from 07/31/2007 and no changes required. gestational diabetes -  at risk for DM Type II fibromyalgia  Past Surgical History: Reviewed history from 07/31/2007 and no changes required. Rt carpal tunnel release - 12/04/2002    Family History: Reviewed history from 07/31/2007 and no changes required. - CVA, + osteoporosis, CAD, Ca, DM, EtOHism, high Chol    Social History: Reviewed history from 10/30/2008 and no changes required. Now permenantly disabled secondary to depression; Has an old Zenaida Niece for transportation; non smoker, non drinker; lives with children age 62,4, and 27 (in 2004) father of children has left the family   Trying to eat healthier Exercise only walking  with errands.     Physical Exam  General:  Well-developed,well-nourished,in no acute distress; alert,appropriate and cooperative throughout examination.  BMI=63 noted Ears:  I still see a possible small perf on Lt tm but looks much smaller and less inflamed than previously desribed. Nose:  Boggy edematous mucosa Mouth:  Oral mucosa and oropharynx without lesions or exudates.  Teeth in good repair. Neck:  no adenopathy Lungs:  Lungs clear but does have a deep, bronchitic  cough. Abdomen:  Bowel sounds positive,abdomen soft and non-tender without masses, organomegaly or hernias noted. Skin:  Patient identified skin lesions of concern 1. Upper middle abdomen Seborrheic Keratosis. 2. Right shoulder- bra line- irritated, raised pigmented nevus 2mm x 2mm. Enlarging per pt. 3.  right temple near hariline : skin colored mole 1.87mm x 1.5 mm   4.  right temple closer to eye: sebacious hyperplasia- white umbilicated with minimal telangectasias 5. 4x4 mm skin colored nevus on right forehead with regular borders and pigmentation. enlarging over several years per patient. Additional Exam:  Procedure note:  Time out performed and consent signed. upper middle abdominal seborrheic keratosis treated with cryotherapy. Lesion on right chest/shoulder prepped with alcohol swabs x 2, injected 0.5 cc 2% xylocaine with epinephrine subcutaneously under lesion.  Shave biopsy performed with good hemostasis with dry-sol solution.  Pressure bandage applied.  Patient tolerated procedure well.     Impression & Recommendations:  Problem # 1:  HYPERTENSION, BENIGN SYSTEMIC (ICD-401.1) Assessment Improved  Her updated medication list for this problem includes:    Hydrochlorothiazide 25 Mg Tabs (Hydrochlorothiazide) ..... One by mouth daily    Lisinopril 20 Mg Tabs (Lisinopril) .Marland Kitchen... 1 tablet by mouth daily    Lisinopril 40 Mg Tabs (Lisinopril) ..... 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 2 puffs each nostril daily.  use even when nose not stuffy.  Orders: [facility]- Est  Level 4 (99214)  Problem # 4:   HYPERCHOLESTEROLEMIA (ICD-272.0) time for labs.  statin dose was increased after last LDL Her updated medication list for this problem includes:    Simvastatin 80 Mg Tabs (Simvastatin) .Marland Kitchen... 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  Sent for pathology. Discussed other skin findings as benign and patient may make return appointment for cosmetic removal if she desires.  Would approach right forehead as elliptical excision at that time.  patient given handout on wound care.  Complete Medication List: 1)  Qvar 80 Mcg/act Aers (Beclomethasone dipropionate) .... Two puffs daily 2)  Proventil Hfa 108 (90 Base) Mcg/act Aers (Albuterol sulfate) .... 2 puffs q4h as needed wheezing 3)  Amitriptyline Hcl 25 Mg Tabs (Amitriptyline hcl) .... One tab three times daily 4)  Clonazepam 1 Mg Tabs (Clonazepam) .... Take 1 tablet by mouth twice a day 5)  Cyclobenzaprine Hcl 10 Mg Tabs (Cyclobenzaprine hcl) .... Take 1 tablet by mouth every night as needed for back/neck pain and spasm 6)  Fluconazole 100 Mg Tabs (Fluconazole) .Marland Kitchen.. 1 tablet by mouth once a day 7)  Fluoxetine Hcl 40 Mg Caps (Fluoxetine hcl) .... Two caps daily 8)  Hydrochlorothiazide 25 Mg Tabs (Hydrochlorothiazide) .... One by mouth daily 9)  Lisinopril 20 Mg Tabs (Lisinopril) .Marland Kitchen.. 1 tablet by mouth daily 10)  Norethindrone Acetate 5 Mg Tabs (Norethindrone acetate) .... Take 1 tablet by mouth once a day 11)  Pepcid 20 Mg Tabs (Famotidine) .... One tab daily 12)  Simvastatin 80 Mg Tabs (Simvastatin) .Marland Kitchen.. 1 by mouth at bedtime 13)  Trazodone Hcl 100 Mg Tabs (Trazodone hcl) .... Take 1 tablet by mouth at bedtime 14)  Ibuprofen 800 Mg Tabs (Ibuprofen) .... One three times a day as  needed for pain 15)  Lisinopril 40 Mg Tabs (Lisinopril) .... One by mouth daily 16)  Triamcinolone Acetonide 0.1 % Oint (Triamcinolone acetonide) .... Apply to affected area two times a day disp 30 gram tube 17)  Guaifenesin-codeine 200-10 Mg/38ml Liqd (Guaifenesin-codeine) .... Take one by mouth q4-6 hours as needed cough 18)  Fluticasone Propionate 50 Mcg/act Susp (Fluticasone propionate) .... 2 puffs each nostril daily.  use even when nose not stuffy.  Other Orders: [facility] ([facility] Prescriptions: FLUTICASONE PROPIONATE 50 MCG/ACT SUSP (FLUTICASONE PROPIONATE) 2 puffs each nostril daily.  Use even when nose not stuffy.  #1 x 12   Entered and Authorized by:   Doralee Albino MD   Signed by:   Doralee Albino MD on 07/23/2009   Method used:   Electronically to        [facility]. [address] [phone]* (retail)       [address]       [place], [place]  [address]       Ph: [phone] or [phone]       Fax: [phone]   RxID:   [id number]    Prevention & Chronic Care Immunizations   Influenza vaccine: Fluvax 3+  (03/10/2009)   Influenza vaccine due: 02/03/2010    Tetanus booster: 10/11/2006: Tdap   Tetanus booster due: 10/10/2016    Pneumococcal vaccine: Pneumovax  (04/29/2007)   Pneumococcal vaccine due: None  Other Screening   Pap smear: normal  (10/30/2007)   Pap smear due: 10/30/2010    Mammogram: ASSESSMENT: Negative - BI-RADS 1^MM DIGITAL SCREENING  (05/14/2009)   Mammogram due: 05/14/2010   Smoking status: never  (07/08/2009)  Lipids   Total Cholesterol: 249  (10/30/2008)   Lipid panel action/deferral: LDL Direct Ordered   LDL: 188  (10/30/2008)   LDL Direct: Not documented   HDL: 40  (10/30/2008)   Triglycerides: 346  (10/30/2008)    SGOT (AST): 12  (10/30/2008)   SGPT (ALT): 12  (10/30/2008) CMP ordered    Alkaline phosphatase: 86  (10/30/2008)   Total bilirubin: 0.4  (10/30/2008)    Lipid flowsheet reviewed?:  Yes   Progress toward LDL goal: Unchanged  Hypertension   Last Blood Pressure: 108 / 72  (07/23/2009)   Serum creatinine: 0.80  (10/30/2008)   Serum potassium 4.2  (10/30/2008) CMP ordered     Hypertension flowsheet reviewed?: Yes   Progress toward BP goal: At goal  Self-Management Support :   Personal Goals (by the next clinic visit) :      Personal blood pressure goal: 140/90  (03/10/2009)     Personal LDL goal: 130  (03/10/2009)    Hypertension self-management support: Written self-care plan  (07/23/2009)  Hypertension self-care plan printed.    Lipid self-management support: Written self-care plan  (07/23/2009)   Lipid self-care plan printed.
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
## 2010-07-05 NOTE — Assessment & Plan Note (Signed)
Summary: flu per pt/Radium/Hensel   Vital Signs:  Patient profile:   50 year old female Height:      64.5 inches Weight:      364 pounds BMI:     61.74 Temp:     98.6 degrees F Pulse rate:   78 / minute BP sitting:   134 / 87  (left arm) Cuff size:   large  Vitals Entered By: Dennison Nancy RN (March 22, 2009 11:43 AM) CC: WI for body aches and diarrhea Is Patient Diabetic? No Pain Assessment Patient in pain? no        Primary Care Provider:  Doralee Albino MD  CC:  [place] for body aches and diarrhea.  History of Present Illness: 1.  diarrhea--since yesterday am.  fevers up 101.3.  body aches and some lower abdominal pain   had flu vaccine on 10/6.  no vomitting.  no blood in stools.  decreased by mouth intake.    Habits & Providers  Alcohol-Tobacco-Diet     Tobacco Status: never  Allergies: 1)  ! Etodolac (Etodolac) 2)  Amoxicillin (Amoxicillin) 3)  Diclofenac Sodium (Diclofenac Sodium) 4)  Iodine (Iodine) 5)  Prilosec (Omeprazole) 6)  Protonix (Pantoprazole Sodium)  Physical Exam  General:  obese; vs reviewed. Pleasant.  looks uncomfortable Abdomen:  mild diffuse ttp throughout abdomen.  soft, normal bowel sounds, no distention, no masses, no guarding, no rigidity, and no rebound tenderness.     Impression & Recommendations:  Problem # 1:  DIARRHEA (ICD-787.91) Assessment New  likely viral.  advised brat diet.  hydration.  rtc if not better in 3-4 days.    Orders: [facility]- Est Level  3 (17616)  Complete Medication List: 1)  Qvar 80 Mcg/act Aers (Beclomethasone dipropionate) .... Two puffs daily 2)  Proventil Hfa 108 (90 Base) Mcg/act Aers (Albuterol sulfate) .... 2 puffs q4h as needed wheezing 3)  Amitriptyline Hcl 25 Mg Tabs (Amitriptyline hcl) .... One tab three times daily 4)  Clonazepam 1 Mg Tabs (Clonazepam) .... Take 1 tablet by mouth twice a day 5)  Cyclobenzaprine Hcl 10 Mg Tabs (Cyclobenzaprine hcl) .... Take 1 tablet by mouth every night as needed for  back/neck pain and spasm 6)  Fluconazole 100 Mg Tabs (Fluconazole) .Marland Kitchen.. 1 tablet by mouth once a day 7)  Fluoxetine Hcl 40 Mg Caps (Fluoxetine hcl) .... Two caps daily 8)  Hydrochlorothiazide 25 Mg Tabs (Hydrochlorothiazide) .... One by mouth daily 9)  Lisinopril 20 Mg Tabs (Lisinopril) .Marland Kitchen.. 1 tablet by mouth daily 10)  Norethindrone Acetate 5 Mg Tabs (Norethindrone acetate) .... Take 1 tablet by mouth once a day 11)  Pepcid 20 Mg Tabs (Famotidine) .... One tab daily 12)  Simvastatin 80 Mg Tabs (Simvastatin) .Marland Kitchen.. 1 by mouth at bedtime 13)  Trazodone Hcl 100 Mg Tabs (Trazodone hcl) .... Take 1 tablet by mouth at bedtime 14)  Ibuprofen 800 Mg Tabs (Ibuprofen) .... One three times a day as needed for pain 15)  Lisinopril 40 Mg Tabs (Lisinopril) .... One by mouth daily  Patient Instructions: 1)  It was nice to see you today. 2)  Eat bland diet for the next few days. 3)  Drink plenty of fluids. 4)  Tylenol for fever. 5)  If you are not better in 3-4 days or if you symptoms get worse, come back to the clinic.

## 2010-07-05 NOTE — Assessment & Plan Note (Signed)
Summary: psoriasis per pt/Kelsey Bullock/Kelsey Bullock   Vital Signs:  Patient profile:   50 year old female Weight:      375.6 pounds Temp:     97.7 degrees F oral Pulse rate:   98 / minute Pulse rhythm:   regular BP sitting:   148 / 86  (right arm) Cuff size:   large  Vitals Entered By: Loralee Pacas CMA (June 25, 2009 1:44 PM) CC: psoriasis Comments psoriasis behind both ears more on irritated behind the right ear using otc med not helping   Primary Care Provider:  Doralee Albino MD  CC:  psoriasis.  History of Present Illness:  C/O rash behind ears.  Has had in past.  Using selsum shampoo.   She is aware she is due for a periodic visit for her multiple health problems.  Current Medications (verified): 1)  Qvar 80 Mcg/act Aers (Beclomethasone Dipropionate) .... Two Puffs Daily 2)  Proventil Hfa 108 (90 Base) Mcg/act Aers (Albuterol Sulfate) .... 2 Puffs Q4h As Needed Wheezing 3)  Amitriptyline Hcl 25 Mg Tabs (Amitriptyline Hcl) .... One Tab Three Times Daily 4)  Clonazepam 1 Mg Tabs (Clonazepam) .... Take 1 Tablet By Mouth Twice A Day 5)  Cyclobenzaprine Hcl 10 Mg Tabs (Cyclobenzaprine Hcl) .... Take 1 Tablet By Mouth Every Night As Needed For Back/neck Pain and Spasm 6)  Fluconazole 100 Mg Tabs (Fluconazole) .Marland Kitchen.. 1 Tablet By Mouth Once A Day 7)  Fluoxetine Hcl 40 Mg  Caps (Fluoxetine Hcl) .... Two Caps Daily 8)  Hydrochlorothiazide 25 Mg Tabs (Hydrochlorothiazide) .... One By Mouth Daily 9)  Lisinopril 20 Mg Tabs (Lisinopril) .Marland Kitchen.. 1 Tablet By Mouth Daily 10)  Norethindrone Acetate 5 Mg Tabs (Norethindrone Acetate) .... Take 1 Tablet By Mouth Once A Day 11)  Pepcid 20 Mg Tabs (Famotidine) .... One Tab Daily 12)  Simvastatin 80 Mg Tabs (Simvastatin) .Marland Kitchen.. 1 By Mouth At Bedtime 13)  Trazodone Hcl 100 Mg Tabs (Trazodone Hcl) .... Take 1 Tablet By Mouth At Bedtime 14)  Ibuprofen 800 Mg  Tabs (Ibuprofen) .... One Three Times A Day As Needed For Pain 15)  Lisinopril 40 Mg Tabs (Lisinopril) ....  One By Mouth Daily 16)  Triamcinolone Acetonide 0.1 % Oint (Triamcinolone Acetonide) .... Apply To Affected Area Two Times A Day Disp 30 Gram Tube  Allergies (verified): 1)  ! Etodolac (Etodolac) 2)  Amoxicillin (Amoxicillin) 3)  Diclofenac Sodium (Diclofenac Sodium) 4)  Iodine (Iodine) 5)  Prilosec (Omeprazole) 6)  Protonix (Pantoprazole Sodium)  Past History:  Past medical, surgical, family and social histories (including risk factors) reviewed, and no changes noted (except as noted below).  Past Medical History: Reviewed history from 07/31/2007 and no changes required. gestational diabetes -  at risk for DM Type II fibromyalgia  Past Surgical History: Reviewed history from 07/31/2007 and no changes required. Rt carpal tunnel release - 12/04/2002    Family History: Reviewed history from 07/31/2007 and no changes required. - CVA, + osteoporosis, CAD, Ca, DM, EtOHism, high Chol    Social History: Reviewed history from 10/30/2008 and no changes required. Now permenantly disabled secondary to depression; Has an old Zenaida Niece for transportation; non smoker, non drinker; lives with children age 21,4, and 46 (in 2004) father of children has left the family   Trying to eat healthier Exercise only walking with errands.     Physical Exam  General:  Well-developed,well-nourished,in no acute distress; alert,appropriate and cooperative throughout examination Head:  seborrheac dermatitis   Impression & Recommendations:  Problem #  1:  SEBORRHEIC DERMATITIS (ICD-690.10)  Add medium dose topical steriod.  Orders: [facility]- Est Level  3 (37628)  Complete Medication List: 1)  Qvar 80 Mcg/act Aers (Beclomethasone dipropionate) .... Two puffs daily 2)  Proventil Hfa 108 (90 Base) Mcg/act Aers (Albuterol sulfate) .... 2 puffs q4h as needed wheezing 3)  Amitriptyline Hcl 25 Mg Tabs (Amitriptyline hcl) .... One tab three times daily 4)  Clonazepam 1 Mg Tabs (Clonazepam) .... Take 1 tablet by mouth  twice a day 5)  Cyclobenzaprine Hcl 10 Mg Tabs (Cyclobenzaprine hcl) .... Take 1 tablet by mouth every night as needed for back/neck pain and spasm 6)  Fluconazole 100 Mg Tabs (Fluconazole) .Marland Kitchen.. 1 tablet by mouth once a day 7)  Fluoxetine Hcl 40 Mg Caps (Fluoxetine hcl) .... Two caps daily 8)  Hydrochlorothiazide 25 Mg Tabs (Hydrochlorothiazide) .... One by mouth daily 9)  Lisinopril 20 Mg Tabs (Lisinopril) .Marland Kitchen.. 1 tablet by mouth daily 10)  Norethindrone Acetate 5 Mg Tabs (Norethindrone acetate) .... Take 1 tablet by mouth once a day 11)  Pepcid 20 Mg Tabs (Famotidine) .... One tab daily 12)  Simvastatin 80 Mg Tabs (Simvastatin) .Marland Kitchen.. 1 by mouth at bedtime 13)  Trazodone Hcl 100 Mg Tabs (Trazodone hcl) .... Take 1 tablet by mouth at bedtime 14)  Ibuprofen 800 Mg Tabs (Ibuprofen) .... One three times a day as needed for pain 15)  Lisinopril 40 Mg Tabs (Lisinopril) .... One by mouth daily 16)  Triamcinolone Acetonide 0.1 % Oint (Triamcinolone acetonide) .... Apply to affected area two times a day disp 30 gram tube Prescriptions: TRIAMCINOLONE ACETONIDE 0.1 % OINT (TRIAMCINOLONE ACETONIDE) apply to affected area two times a day Disp 30 gram tube  #30 x 3   Entered and Authorized by:   Doralee Albino MD   Signed by:   Doralee Albino MD on 06/25/2009   Method used:   Electronically to        [facility]. [address]. [phone]* (retail)       [address]       [place], [place]  [address]       Ph: [phone] or [phone]       Fax: [phone]   RxID:   [id number]    Prevention & Chronic Care Immunizations   Influenza vaccine: Fluvax 3+  (03/10/2009)   Influenza vaccine due: 03/05/2009    Tetanus booster: 10/11/2006: Tdap   Tetanus booster due: 10/10/2016    Pneumococcal vaccine: Pneumovax  (04/29/2007)   Pneumococcal vaccine due: None  Other Screening   Pap smear: normal  (10/30/2007)   Pap smear due: 10/30/2010    Mammogram: ASSESSMENT: Negative - BI-RADS  1^MM DIGITAL SCREENING  (05/14/2009)   Mammogram due: 05/14/2010   Smoking status: never  (03/22/2009)  Lipids   Total Cholesterol: 249  (10/30/2008)   LDL: 140  (10/30/2008)   LDL Direct: Not documented   HDL: 40  (10/30/2008)   Triglycerides: 346  (10/30/2008)    SGOT (AST): 12  (10/30/2008)   SGPT (ALT): 12  (10/30/2008)   Alkaline phosphatase: 86  (10/30/2008)   Total bilirubin: 0.4  (10/30/2008)  Hypertension   Last Blood Pressure: 148 / 86  (06/25/2009)   Serum creatinine: 0.80  (10/30/2008)   Serum potassium 4.2  (10/30/2008)  Self-Management Support :   Personal Goals (by the next clinic visit) :      Personal blood pressure goal: 140/90  (03/10/2009)  Personal LDL goal: 130  (03/10/2009)    Hypertension self-management support: Written self-care plan  (03/10/2009)    Lipid self-management support: Written self-care plan  (03/10/2009)

## 2010-07-05 NOTE — Miscellaneous (Signed)
  Clinical Lists Changes  Medications: Changed medication from ALBUTEROL 90 MCG/ACT AERS (ALBUTEROL) Inhale 2 puff using inhaler every four hours to PROVENTIL HFA 108 (90 BASE) MCG/ACT AERS (ALBUTEROL SULFATE) 2 puffs q4h as needed wheezing

## 2010-07-05 NOTE — Progress Notes (Signed)
Summary: Triage  Phone Note Call from Patient   Summary of Call: Pt wants to speak with a nurse about her ear hurting. Initial call taken by: Haydee Salter,  July 17, 2007 9:43 AM  Follow-up for Phone Call        c/o ear pain. states children have same and are on antibiotics. appt made Follow-up by: Golden Circle RN,  July 17, 2007 9:46 AM

## 2010-07-05 NOTE — Progress Notes (Signed)
Summary: Triage  Phone Note Call from Patient Call back at Home Phone [phone]   Reason for Call: Talk to Nurse Summary of Call: pt wants to be seen today for cold symptoms. Initial call taken by: Haydee Salter,  June 12, 2008 9:27 AM  Follow-up for Phone Call        sick x 2 weeks. now has chest congestion. does not smoke. has been exposed to cigarette smoke lately. appt at 11am with Dr. Rexene Alberts. son sick. appt made with same md after mom's visit Follow-up by: Golden Circle RN,  June 12, 2008 9:34 AM

## 2010-07-05 NOTE — Letter (Signed)
Summary: Results Follow-up Letter  [facility]  [address]   [place], [place] [address]   Phone: [phone]  Fax: [phone]    07/27/2009  [address] [place], [place]  [address]  Dear Ms. Stratmann,   The following are the results of your recent test(s):  Your mole biopsy shows that it is an atypical mole which is what we expected. There is no skin cancer.  I recommend watching for any changes in freckles or moles and continuing to protect your skin with a high SPF sunscreen daily.  Bring any new skin findings to the attention of your doctor. Please let us know if you have any other questions or concerns.  Sincerely,  Delbert Harness MD [facility]           Appended Document: Results Follow-up Letter mailed.

## 2010-07-05 NOTE — Miscellaneous (Signed)
Summary: Handicapped Placard    Patient dropped off form to be filled out for a handicap placard.  Please call her when it is ready to be picked up. [facility] HARRELSON  January 14, 2008 2:03 PM  Done    Appended Document: Handicapped Placard   LM ON VM AT [phone] HANDICAP PLACARD FORM COMPLETED AND READY FOR PICKUP AT FRONT DESK    ............................Kelsey Bullock,CMA (AAMA)

## 2010-07-05 NOTE — Assessment & Plan Note (Signed)
Summary: bug in ear,df   Vital Signs:  Patient profile:   50 year old female Height:      64.5 inches Weight:      369 pounds BMI:     62.59 BSA:     2.55 Temp:     98.4 degrees F Pulse rate:   93 / minute BP sitting:   146 / 87  Vitals Entered By: Jone Baseman CMA (March 12, 2009 9:04 AM) CC: moth in left ear Is Patient Diabetic? No Pain Assessment Patient in pain? yes     Location: left ear Intensity: 8   Primary Care Provider:  Doralee Albino MD  CC:  moth in left ear.  History of Present Illness: 1. bug in L ear Pt states that a moth flew in ear at 1 am this morning. She can hear it "fluttering around" and wants to see if it can be taken out. Has not fluttered for a while. Reports pain in that area as well.   ROS: + for dizziness, no nausea/vomting. No headache yet. Able to eat and drink.  Habits & Providers  Alcohol-Tobacco-Diet     Tobacco Status: never  Current Medications (verified): 1)  Qvar 80 Mcg/act Aers (Beclomethasone Dipropionate) .... Two Puffs Daily 2)  Proventil Hfa 108 (90 Base) Mcg/act Aers (Albuterol Sulfate) .... 2 Puffs Q4h As Needed Wheezing 3)  Amitriptyline Hcl 25 Mg Tabs (Amitriptyline Hcl) .... One Tab Three Times Daily 4)  Clonazepam 1 Mg Tabs (Clonazepam) .... Take 1 Tablet By Mouth Twice A Day 5)  Cyclobenzaprine Hcl 10 Mg Tabs (Cyclobenzaprine Hcl) .... Take 1 Tablet By Mouth Every Night As Needed For Back/neck Pain and Spasm 6)  Fluconazole 100 Mg Tabs (Fluconazole) .Marland Kitchen.. 1 Tablet By Mouth Once A Day 7)  Fluoxetine Hcl 40 Mg  Caps (Fluoxetine Hcl) .... Two Caps Daily 8)  Hydrochlorothiazide 25 Mg Tabs (Hydrochlorothiazide) .... One By Mouth Daily 9)  Lisinopril 20 Mg Tabs (Lisinopril) .Marland Kitchen.. 1 Tablet By Mouth Daily 10)  Norethindrone Acetate 5 Mg Tabs (Norethindrone Acetate) .... Take 1 Tablet By Mouth Once A Day 11)  Pepcid 20 Mg Tabs (Famotidine) .... One Tab Daily 12)  Simvastatin 80 Mg Tabs (Simvastatin) .Marland Kitchen.. 1 By Mouth At  Bedtime 13)  Trazodone Hcl 100 Mg Tabs (Trazodone Hcl) .... Take 1 Tablet By Mouth At Bedtime 14)  Ibuprofen 800 Mg  Tabs (Ibuprofen) .... One Three Times A Day As Needed For Pain 15)  Lisinopril 40 Mg Tabs (Lisinopril) .... One By Mouth Daily  Allergies (verified): 1)  ! Etodolac (Etodolac) 2)  Amoxicillin (Amoxicillin) 3)  Diclofenac Sodium (Diclofenac Sodium) 4)  Iodine (Iodine) 5)  Prilosec (Omeprazole) 6)  Protonix (Pantoprazole Sodium)  Physical Exam  General:  obese; vs reviewed. Pleasant.  Ears:  R ear with normal canal and TM L ear with normal TM. Small insect on inferior portion of external canal. This is easily removed with small forceps. Canal reexamined and clear of debris. No erythema, drainage or discharge.    Impression & Recommendations:  Problem # 1:  FOREIGN BODY IN EAR (ICD-931) Assessment New  removed. Return parameters discussed.  Patient/family agreeable. A/B otic for ear pain.   Orders: [facility]- Est Level  3 (69629)  Complete Medication List: 1)  Qvar 80 Mcg/act Aers (Beclomethasone dipropionate) .... Two puffs daily 2)  Proventil Hfa 108 (90 Base) Mcg/act Aers (Albuterol sulfate) .... 2 puffs q4h as needed wheezing 3)  Amitriptyline Hcl 25 Mg Tabs (Amitriptyline hcl) .Marland KitchenMarland KitchenMarland Kitchen  One tab three times daily 4)  Clonazepam 1 Mg Tabs (Clonazepam) .... Take 1 tablet by mouth twice a day 5)  Cyclobenzaprine Hcl 10 Mg Tabs (Cyclobenzaprine hcl) .... Take 1 tablet by mouth every night as needed for back/neck pain and spasm 6)  Fluconazole 100 Mg Tabs (Fluconazole) .Marland Kitchen.. 1 tablet by mouth once a day 7)  Fluoxetine Hcl 40 Mg Caps (Fluoxetine hcl) .... Two caps daily 8)  Hydrochlorothiazide 25 Mg Tabs (Hydrochlorothiazide) .... One by mouth daily 9)  Lisinopril 20 Mg Tabs (Lisinopril) .Marland Kitchen.. 1 tablet by mouth daily 10)  Norethindrone Acetate 5 Mg Tabs (Norethindrone acetate) .... Take 1 tablet by mouth once a day 11)  Pepcid 20 Mg Tabs (Famotidine) .... One tab daily 12)   Simvastatin 80 Mg Tabs (Simvastatin) .Marland Kitchen.. 1 by mouth at bedtime 13)  Trazodone Hcl 100 Mg Tabs (Trazodone hcl) .... Take 1 tablet by mouth at bedtime 14)  Ibuprofen 800 Mg Tabs (Ibuprofen) .... One three times a day as needed for pain 15)  Lisinopril 40 Mg Tabs (Lisinopril) .... One by mouth daily

## 2010-07-05 NOTE — Assessment & Plan Note (Signed)
Summary: FLU SHOT/KH  Nurse Visit   Vital Signs:  Patient Profile:   50 Years Old Female Temp:     98.9 degrees F                 Prior Medications: ADVAIR DISKUS 250-50 MCG/DOSE MISC (FLUTICASONE-SALMETEROL) Inhale 1 puff as directed twice a day ALBUTEROL 90 MCG/ACT AERS (ALBUTEROL) Inhale 2 puff using inhaler every four hours AMITRIPTYLINE HCL 25 MG TABS (AMITRIPTYLINE HCL) one tab three times daily CLONAZEPAM 1 MG TABS (CLONAZEPAM) Take 1 tablet by mouth twice a day CYCLOBENZAPRINE HCL 10 MG TABS (CYCLOBENZAPRINE HCL) Take 1 tablet by mouth every night FLUCONAZOLE 100 MG TABS (FLUCONAZOLE) 1 tablet by mouth once a day FLUOXETINE HCL 40 MG  CAPS (FLUOXETINE HCL) two caps daily HYDROCHLOROTHIAZIDE 25 MG TABS (HYDROCHLOROTHIAZIDE)  HYDROCORTISONE 2.5 % OINT (HYDROCORTISONE) Apply a small amount to skin every night LISINOPRIL 10 MG TABS (LISINOPRIL) Take 1 tablet by mouth once a day NORETHINDRONE ACETATE 5 MG TABS (NORETHINDRONE ACETATE) Take 1 tablet by mouth once a day PEPCID 20 MG TABS (FAMOTIDINE)  SIMVASTATIN 20 MG TABS (SIMVASTATIN) Take 1 tablet by mouth every night TRAZODONE HCL 100 MG TABS (TRAZODONE HCL) Take 1 tablet by mouth at bedtime IBUPROFEN 800 MG  TABS (IBUPROFEN) one three times a day as needed for pain LAMICTAL 100 MG  TABS (LAMOTRIGINE) one by mouth daily PREDNISONE 20 MG  TABS (PREDNISONE) one tab two times a day Current Allergies: AMOXICILLIN (AMOXICILLIN) DICLOFENAC SODIUM (DICLOFENAC SODIUM) IODINE (IODINE) PRILOSEC (OMEPRAZOLE) PROTONIX (PANTOPRAZOLE SODIUM)   Influenza Vaccine    Vaccine Type: Fluvax 3+    Site: right deltoid    Mfr: Sanofi Pasteur    Dose: 0.5 ml    Route: IM    Given by: Golden Circle RN    Exp. Date: 12/03/2007    Lot #: [id number]    VIS given: 12/02/04 version given March 20, 2007.  Flu Vaccine Consent Questions    Do you have a history of severe allergic reactions to this vaccine? no    Any prior history of  allergic reactions to egg and/or gelatin? no    Do you have a sensitivity to the preservative Thimersol? no    Do you have a past history of Guillan-Barre Syndrome? no    Do you currently have an acute febrile illness? no    Have you ever had a severe reaction to latex? no    Vaccine information given and explained to patient? yes    Are you currently pregnant? no   Orders Added: 1)  Est Level 1- [facility] [[address]]    ]

## 2010-07-05 NOTE — Progress Notes (Signed)
Summary: Jury Duty  Phone Note Call from Patient Call back at [facility] Phone [phone]   Summary of Call: Pt states she received a letter telling her she had to do jury duty and wants to know if Dr. Leveda Anna will fill out a form she has so she will not have to do it. Initial call taken by: Haydee Salter,  July 12, 2007 9:56 AM  Follow-up for Phone Call        Called and LM that I would fill out the form to medically excuse her from jury duty. Follow-up by: Doralee Albino MD,  July 12, 2007 12:12 PM     Pt brought in jury duty form requesting physician to complete to have her exempt from serving.  Form left in phy's box to review and complete ...................................................................BARBARA MCGREGOR  July 12, 2007 12:12 PM

## 2010-07-05 NOTE — Progress Notes (Signed)
Summary: triage  Phone Note Call from Patient Call back at Home Phone [phone]   Caller: Patient Summary of Call: pt has psoriasis behind ear and is red and raw. Initial call taken by: Clydell Hakim,  June 25, 2009 12:02 PM  Follow-up for Phone Call        states she was diagnosed with this a long time ago. it is bothering her a lot. using otc lotions & cremes. to see md at 1:30 work in. aware of wait Follow-up by: Golden Circle RN,  June 25, 2009 12:08 PM  Additional Follow-up for Phone Call Additional follow up Details #1::        noted and agree Additional Follow-up by: Doralee Albino MD,  June 25, 2009 1:33 PM

## 2010-07-05 NOTE — Assessment & Plan Note (Signed)
Summary: chest congestion x 2 weeks   Vital Signs:  Patient Profile:   50 Years Old Female Height:     65 inches Weight:      361 pounds Temp:     99.0 degrees F Pulse rate:   93 / minute BP sitting:   144 / 86  (right arm)  Pt. in pain?   yes    Location:   abdomen and chest only with coughing    Intensity:   6    Type:       soreness  Vitals Entered ByJacki Cones RN (June 12, 2008 11:24 AM)                  PCP:  Doralee Albino MD  Chief Complaint:  congested and cough.  History of Present Illness: 1. COUGH/SOB Onset: 2 weeks ago Description: productive of brownish sputum; also with congestion Modifying factors:  progressive. Using albuterol 2x/day with some help  Symptoms Productive: yes Wheezing: yes Dyspnea: yes Nasal discharge: yes Fever: no, no chills. Sick contacts: yes, multiple family members with GI illness recently  History of Asthma: yes  Taking advair two times a day as directed. Using albuterol 2x/day.      Current Allergies: ! ETODOLAC (ETODOLAC) AMOXICILLIN (AMOXICILLIN) DICLOFENAC SODIUM (DICLOFENAC SODIUM) IODINE (IODINE) PRILOSEC (OMEPRAZOLE) PROTONIX (PANTOPRAZOLE SODIUM)      Physical Exam  General:     alert.  mildly ill-appearing. obese. appropriate with exam.alert.   Lungs:     work of breathing unlabored, speaking in complete sentences. Diffuse ronchi and wheeze throughout. Decreased BS bilateral lower lobes. Heart:     normal rate and regular rhythm.   Pulses:     2+ DP and radial pulses. Skin:     turgor normal, color normal, and no rashes.      Impression & Recommendations:  Problem # 1:  SHORTNESS OF BREATH (ICD-786.05) Assessment: New With exacerbation of asthma. Given duration of symptoms will treat with azithromycin x 7 days. Also add prednisone 40 mg daily x 10 days with solumedrol 125mg  IM in office for asthma exacerbation. Feels better after neb treatment in office. Advised increasing home albuterol  to every 4 hours as needed. Return parameters discussed as per instruction section.  Orders: [facility]- Est Level  3 (95621)   Complete Medication List: 1)  Advair Diskus 500-50 Mcg/dose Misc (Fluticasone-salmeterol) .... One puff twice daily 2)  Albuterol 90 Mcg/act Aers (Albuterol) .... Inhale 2 puff using inhaler every four hours 3)  Amitriptyline Hcl 25 Mg Tabs (Amitriptyline hcl) .... One tab three times daily 4)  Clonazepam 1 Mg Tabs (Clonazepam) .... Take 1 tablet by mouth twice a day 5)  Cyclobenzaprine Hcl 10 Mg Tabs (Cyclobenzaprine hcl) .... Take 1 tablet by mouth every night as needed for back/neck pain and spasm 6)  Fluconazole 100 Mg Tabs (Fluconazole) .Marland Kitchen.. 1 tablet by mouth once a day 7)  Fluoxetine Hcl 40 Mg Caps (Fluoxetine hcl) .... Two caps daily 8)  Hydrochlorothiazide 25 Mg Tabs (Hydrochlorothiazide) .... One by mouth daily 9)  Lisinopril 10 Mg Tabs (Lisinopril) .... Take 1 tablet by mouth once a day 10)  Norethindrone Acetate 5 Mg Tabs (Norethindrone acetate) .... Take 1 tablet by mouth once a day 11)  Pepcid 20 Mg Tabs (Famotidine) .... One tab daily 12)  Simvastatin 20 Mg Tabs (Simvastatin) .... Take 1 tablet by mouth every night 13)  Trazodone Hcl 100 Mg Tabs (Trazodone hcl) .... Take 1 tablet by  mouth at bedtime 14)  Ibuprofen 800 Mg Tabs (Ibuprofen) .... One three times a day as needed for pain 15)  Geodon 40 Mg Caps (Ziprasidone hcl) .... One by mouth with supper as needed 16)  Azithromycin 500 Mg Tabs (Azithromycin) .... One by mouth daily x 7 days. 17)  Prednisone 20 Mg Tabs (Prednisone) .... Two tabs by mouth daily x 10 days.   Patient Instructions: 1)  Take the steroids for 7 days. 2)  Increase your albuterol to every 4 hours as needed for shortness of breath or wheezing. 3)  Take the cough medicine, but don't drive or operate machinery until you know how it will affect you. 4)  Seek medical attention for worening symptoms, difficutly breathing, high fever, or  other concerns. Call if you're not feeling some better by Monday. 5)  Think about getting a humidifier.   Prescriptions: PREDNISONE 20 MG TABS (PREDNISONE) two tabs by mouth daily x 10 days.  #20 x 0   Entered and Authorized by:   Myrtie Soman  MD   Signed by:   Myrtie Soman  MD on 06/12/2008   Method used:   Electronically to        [facility] [address] [phone]* (retail)       [address]       [place], [place]  [address]       Ph: [phone] or [phone]       Fax: [phone]   RxID:   [id number] AZITHROMYCIN 500 MG TABS (AZITHROMYCIN) one by mouth daily x 7 days.  #7 x 0   Entered and Authorized by:   Myrtie Soman  MD   Signed by:   Myrtie Soman  MD on 06/12/2008   Method used:   Electronically to        [facility] [address] [phone]* (retail)       [address]       [place], [place]  [address]       Ph: [phone] or [phone]       Fax: [phone]   RxID:   [id number]   Appended Document: chest congestion x 2 weeks   Medication Administration  Injection # 1:    Medication: Solumedrol up to 125mg     Diagnosis: SHORTNESS OF BREATH (ICD-786.05)    Route: IM    Site: RUOQ gluteus    Exp Date: 03/2010    Lot #: [id number]    Mfr: [employer]    Comments: Pt given 125 mg IM.    Patient tolerated injection without complications    Given by: AMY MARTIN RN (June 12, 2008 12:09 PM)  Medication # 1:    Medication: Albuterol Sulfate Sol 1mg  unit dose    Diagnosis: SHORTNESS OF BREATH (ICD-786.05)    Dose: 2.5 mg    Route: inhaled    Exp Date: 09/2009    Lot #: [id number]    Mfr: nephron    Patient tolerated medication without complications    Given by: AMY MARTIN RN (June 12, 2008 12:10 PM)  Medication # 2:    Medication: Atrovent 1mg  (Neb)    Diagnosis: SHORTNESS OF BREATH (ICD-786.05)    Dose: 0.5 mg    Route: inhaled    Exp Date: 09/2009    Lot #: [id number]    Mfr: nephron    Patient  tolerated medication without complications  Given by: Jacki Cones RN (June 12, 2008 12:11 PM)  Orders Added: 1)  Solumedrol up to 125mg  [J2930] 2)  Albuterol Sulfate Sol 1mg  unit dose [J7613] 3)  Atrovent 1mg  (Neb) [E4540]

## 2010-07-05 NOTE — Assessment & Plan Note (Signed)
Summary: back pain and cough   Vital Signs:  Patient Profile:   50 Years Old Female Weight:      326 pounds Pulse rate:   83 / minute BP sitting:   129 / 86  Vitals Entered By: Lillia Pauls CMA (February 18, 2007 2:16 PM)                 PCP:  Doralee Albino MD  Chief Complaint:  routine visit/ingrown toe nail[facility]  History of Present Illness: 1 Back pain for 2-3 days  2. Cough cold for 1 week.  Cough productive of yellow sputum.  Current Allergies: AMOXICILLIN (AMOXICILLIN) DICLOFENAC SODIUM (DICLOFENAC SODIUM) IODINE (IODINE) PRILOSEC (OMEPRAZOLE) PROTONIX (PANTOPRAZOLE SODIUM)      Physical Exam  General:     Well-developed,well-nourished,in no acute distress; alert,appropriate and cooperative throughout examination Lungs:     Bilateral wheezes Msk:     Back bilateral lumbar paraspinous muscle tenderness.    Impression & Recommendations:  Problem # 1:  ASTHMA NOS W/O STATUS ASTHMATICUS (ICD-493.90) Added burst of prednisone for cough related to asthma flair from URI Her updated medication list for this problem includes:    Advair Diskus 250-50 Mcg/dose Misc (Fluticasone-salmeterol) ..... Inhale 1 puff as directed twice a day    Albuterol 90 Mcg/act Aers (Albuterol) ..... Inhale 2 puff using inhaler every four hours    Prednisone 20 Mg Tabs (Prednisone) ..... One tab two times a day  Orders: [facility]- Est Level  3 (99213)   Problem # 2:  LOW BACK PAIN SYNDROME (ICD-724.2) I hope the prednisone will also help back pain The following medications were removed from the medication list:    Propoxyphene N-apap 100-650 Mg Tabs (Propoxyphene n-apap) .Marland Kitchen... 1 tablet by mouth twice a day  Her updated medication list for this problem includes:    Cyclobenzaprine Hcl 10 Mg Tabs (Cyclobenzaprine hcl) .Marland Kitchen... Take 1 tablet by mouth every night    Ibuprofen 800 Mg Tabs (Ibuprofen) ..... One three times a day as needed for  pain  Orders: [facility]- Est Level  3 (99213)   Complete Medication List: 1)  Advair Diskus 250-50 Mcg/dose Misc (Fluticasone-salmeterol) .... Inhale 1 puff as directed twice a day 2)  Albuterol 90 Mcg/act Aers (Albuterol) .... Inhale 2 puff using inhaler every four hours 3)  Amitriptyline Hcl 25 Mg Tabs (Amitriptyline hcl) .... One tab three times daily 4)  Clonazepam 1 Mg Tabs (Clonazepam) .... Take 1 tablet by mouth twice a day 5)  Cyclobenzaprine Hcl 10 Mg Tabs (Cyclobenzaprine hcl) .... Take 1 tablet by mouth every night 6)  Fluconazole 100 Mg Tabs (Fluconazole) .Marland Kitchen.. 1 tablet by mouth once a day 7)  Fluoxetine Hcl 40 Mg Caps (Fluoxetine hcl) .... Two caps daily 8)  Hydrochlorothiazide 25 Mg Tabs (Hydrochlorothiazide) 9)  Hydrocortisone 2.5 % Oint (Hydrocortisone) .... Apply a small amount to skin every night 10)  Lisinopril 10 Mg Tabs (Lisinopril) .... Take 1 tablet by mouth once a day 11)  Norethindrone Acetate 5 Mg Tabs (Norethindrone acetate) .... Take 1 tablet by mouth once a day 12)  Pepcid 20 Mg Tabs (Famotidine) 13)  Simvastatin 20 Mg Tabs (Simvastatin) .... Take 1 tablet by mouth every night 14)  Trazodone Hcl 100 Mg Tabs (Trazodone hcl) .... Take 1 tablet by mouth at bedtime 15)  Ibuprofen 800 Mg Tabs (Ibuprofen) .... One three times a day as needed for pain 16)  Lamictal 100 Mg Tabs (Lamotrigine) .... One by mouth daily 17)  Prednisone 20 Mg Tabs (Prednisone) .... One tab two times a day     Prescriptions: PREDNISONE 20 MG  TABS (PREDNISONE) one tab two times a day  #14 x 0   Entered and Authorized by:   Doralee Albino MD   Signed by:   Doralee Albino MD on 02/18/2007   Method used:   Electronically sent to ...       [facility]       [address]       [facility]       [place], [place]  [address]       Ph: [phone]       Fax: [phone]   RxID:   [id number]  ]

## 2010-07-05 NOTE — Assessment & Plan Note (Signed)
Summary: FLU SHOT/KH  Nurse Visit    Prior Medications: ADVAIR DISKUS 500-50 MCG/DOSE  MISC (FLUTICASONE-SALMETEROL) one puff twice daily ALBUTEROL 90 MCG/ACT AERS (ALBUTEROL) Inhale 2 puff using inhaler every four hours AMITRIPTYLINE HCL 25 MG TABS (AMITRIPTYLINE HCL) one tab three times daily CLONAZEPAM 1 MG TABS (CLONAZEPAM) Take 1 tablet by mouth twice a day CYCLOBENZAPRINE HCL 10 MG TABS (CYCLOBENZAPRINE HCL) Take 1 tablet by mouth every night as needed for back/neck pain and spasm FLUCONAZOLE 100 MG TABS (FLUCONAZOLE) 1 tablet by mouth once a day FLUOXETINE HCL 40 MG  CAPS (FLUOXETINE HCL) two caps daily HYDROCHLOROTHIAZIDE 25 MG TABS (HYDROCHLOROTHIAZIDE) one by mouth daily LISINOPRIL 10 MG TABS (LISINOPRIL) Take 1 tablet by mouth once a day NORETHINDRONE ACETATE 5 MG TABS (NORETHINDRONE ACETATE) Take 1 tablet by mouth once a day PEPCID 20 MG TABS (FAMOTIDINE) one tab daily SIMVASTATIN 20 MG TABS (SIMVASTATIN) Take 1 tablet by mouth every night TRAZODONE HCL 100 MG TABS (TRAZODONE HCL) Take 1 tablet by mouth at bedtime IBUPROFEN 800 MG  TABS (IBUPROFEN) one three times a day as needed for pain GEODON 40 MG  CAPS (ZIPRASIDONE HCL) one by mouth with supper as needed Current Allergies: ! ETODOLAC (ETODOLAC) AMOXICILLIN (AMOXICILLIN) DICLOFENAC SODIUM (DICLOFENAC SODIUM) IODINE (IODINE) PRILOSEC (OMEPRAZOLE) PROTONIX (PANTOPRAZOLE SODIUM)   Influenza Vaccine    Vaccine Type: Fluvax Non-MCR    Site: left deltoid    Mfr: [employer]    Dose: 0.5 ml    Route: IM    Given by: Heywood Footman RN    Exp. Date: 12/02/2008    Lot #: [id number]    VIS given: 12/27/06 version given March 06, 2008.  Flu Vaccine Consent Questions    Do you have a history of severe allergic reactions to this vaccine? no    Any prior history of allergic reactions to egg and/or gelatin? no    Do you have a sensitivity to the preservative Thimersol? no    Do you have a past history of  Guillan-Barre Syndrome? no    Do you currently have an acute febrile illness? no    Have you ever had a severe reaction to latex? no    Vaccine information given and explained to patient? yes    Are you currently pregnant? no   Orders Added: 1)  Influenza Vaccine NON MCR [00028] 2)  Est Level 1- [facility] [21308]    ]

## 2010-07-05 NOTE — Miscellaneous (Signed)
Summary: disability  Clinical Lists Changes faxed records back to [employer]. pt is trying to get disability...Marland KitchenMarland KitchenGolden Circle RN  August 13, 2008 3:48 PM

## 2010-07-05 NOTE — Progress Notes (Signed)
Summary: triage  Phone Note Call from Patient Call back at Home Phone [phone]   Caller: Patient Summary of Call: pt is feeling really bad and feels like she has the flu,  wants to come in today.  offered 3:00 w/ saxon, but wants to try to come in sooner. Initial call taken by: De Nurse,  March 22, 2009 10:37 AM  Follow-up for Phone Call        states she has body aches, diarrhea all day yesterday, HA & generally feels bad. wants to be seen this am. she will be here by 11am to be seen in work in clinic. she is aware her pcp will not be seeing her Follow-up by: Golden Circle RN,  March 22, 2009 10:42 AM  Additional Follow-up for Phone Call Additional follow up Details #1::        noted and agree Additional Follow-up by: Doralee Albino MD,  March 22, 2009 3:57 PM

## 2010-07-05 NOTE — Progress Notes (Signed)
Summary: [place] request  Phone Note Call from Patient Call back at [phone]   Summary of Call: pt is needing to be seen for hurting foot very badly Initial call taken by: Haydee Salter,  Oct 10, 2006 10:14 AM  Follow-up for Phone Call        stepped on a piece of metal last night. (not rusty) cut foot on high part of arch-about 2 inches, not deep. to keep clean & dry, stay off  foot if able for today, ice off & on for swelling. tyl for pain. reviewed s/s infection. will call for appt if any develop. had recent TD Follow-up by: Golden Circle RN,  Oct 10, 2006 10:16 AM

## 2010-07-05 NOTE — Progress Notes (Signed)
Summary: Triage  Phone Note Call from Patient Call back at Home Phone [phone]   Reason for Call: Talk to Nurse Summary of Call: Pt states she has a stomach bug and is throwing up and has diarrhea.  Pt would like to know what she can do at home.  Her 2 children have this as well. Initial call taken by: Haydee Salter,  May 21, 2007 10:21 AM  Follow-up for Phone Call        pt states her two children had vomiting yesterday. their vomiting stopped last night. discussed pushing clear liquids today and other dietary measures with mother. start BRAT foods today and push fluids. Shawntia states she developed vomiting this AM and has vomitied x 1 around 5:00 this am. has had diarrhea x 7 . advised to start sipping clear liquids now and then as tolerated push fluids . BRAT foods later this evening if no further vomiting.. she has no fever she states. Follow-up by: Theresia Lo RN,  May 21, 2007 11:03 AM

## 2010-07-05 NOTE — Assessment & Plan Note (Signed)
Summary: persistant cough   Vital Signs:  Patient Profile:   50 Years Old Female Height:     65 inches Weight:      348.7 pounds O2 Sat:      97 % Temp:     99.1 degrees F oral Pulse rate:   92 / minute BP sitting:   132 / 96  (left arm)  Vitals Entered By: Alphia Kava (July 09, 2008 11:20 AM)             Is Patient Diabetic? No     Visit Type:  work in PCP:  Doralee Albino MD  Chief Complaint:  cough and sore throat.  History of Present Illness: 50 y/o female with h/o asthma. a month ago seen and diagnosed with asthma exacerbation. had some relief with antibiotic and prednisone burst. symptoms recurred the past week or two. +paroxysms of cough, wheezing, SOB, no fever, +chills.      Current Allergies: ! ETODOLAC (ETODOLAC) AMOXICILLIN (AMOXICILLIN) DICLOFENAC SODIUM (DICLOFENAC SODIUM) IODINE (IODINE) PRILOSEC (OMEPRAZOLE) PROTONIX (PANTOPRAZOLE SODIUM)       Physical Exam  General:     alert.  mildly ill-appearing. obese. appropriate with exam. multiple paroxysms of cough Nose:     mucosal erythema, L frontal sinus tenderness, and R frontal sinus tenderness.   Mouth:     postnasal drip.  posterior oropharyngeal erythema. Lungs:     work of breathing unlabored, speaking in complete sentences. Diffuse ronchi and wheeze throughout. Decreased BS bilateral lower lobes. Heart:     normal rate and regular rhythm.   Abdomen:     Bowel sounds positive,abdomen soft and non-tender without masses, organomegaly or hernias noted.    Impression & Recommendations:  Problem # 1:  ACUTE BRONCHITIS (ICD-466.0) Assessment: New vs. asthma exacerbation. will tx with doxycycline and prednisone burst. will also provide rx for antitussive agent. patient given red flags to prompt further eval (please see patient instructions.)  The following medications were removed from the medication list:    Azithromycin 500 Mg Tabs (Azithromycin) ..... One by mouth daily x 7  days.  Her updated medication list for this problem includes:    Advair Diskus 500-50 Mcg/dose Misc (Fluticasone-salmeterol) ..... One puff twice daily    Albuterol 90 Mcg/act Aers (Albuterol) ..... Inhale 2 puff using inhaler every four hours    Doxycycline Hyclate 100 Mg Caps (Doxycycline hyclate) .Marland Kitchen... Take 1 tab twice a day    Tessalon Perles 100 Mg Caps (Benzonatate) .Marland Kitchen..Marland Kitchen Two tabs by mouth three times a day  Orders: [facility]- Est Level  3 (59563)   Complete Medication List: 1)  Advair Diskus 500-50 Mcg/dose Misc (Fluticasone-salmeterol) .... One puff twice daily 2)  Albuterol 90 Mcg/act Aers (Albuterol) .... Inhale 2 puff using inhaler every four hours 3)  Amitriptyline Hcl 25 Mg Tabs (Amitriptyline hcl) .... One tab three times daily 4)  Clonazepam 1 Mg Tabs (Clonazepam) .... Take 1 tablet by mouth twice a day 5)  Cyclobenzaprine Hcl 10 Mg Tabs (Cyclobenzaprine hcl) .... Take 1 tablet by mouth every night as needed for back/neck pain and spasm 6)  Fluconazole 100 Mg Tabs (Fluconazole) .Marland Kitchen.. 1 tablet by mouth once a day 7)  Fluoxetine Hcl 40 Mg Caps (Fluoxetine hcl) .... Two caps daily 8)  Hydrochlorothiazide 25 Mg Tabs (Hydrochlorothiazide) .... One by mouth daily 9)  Lisinopril 10 Mg Tabs (Lisinopril) .... Take 1 tablet by mouth once a day 10)  Norethindrone Acetate 5 Mg Tabs (Norethindrone acetate) .... Take  1 tablet by mouth once a day 11)  Pepcid 20 Mg Tabs (Famotidine) .... One tab daily 12)  Simvastatin 20 Mg Tabs (Simvastatin) .... Take 1 tablet by mouth every night 13)  Trazodone Hcl 100 Mg Tabs (Trazodone hcl) .... Take 1 tablet by mouth at bedtime 14)  Ibuprofen 800 Mg Tabs (Ibuprofen) .... One three times a day as needed for pain 15)  Geodon 40 Mg Caps (Ziprasidone hcl) .... One by mouth with supper as needed 16)  Prednisone 20 Mg Tabs (Prednisone) .... Take 3 tabs (60 mg) by mouth daily x7days. 17)  Doxycycline Hyclate 100 Mg Caps (Doxycycline hyclate) .... Take 1 tab twice  a day 18)  Tessalon Perles 100 Mg Caps (Benzonatate) .... Two tabs by mouth three times a day   Patient Instructions: 1)  Take the steroids for 7 days. 2)  Increase your albuterol to every 4 hours as needed for shortness of breath or wheezing. 3)  Seek medical attention for worsening symptoms, difficutly breathing, high fever, or other concerns.    Prescriptions: TESSALON PERLES 100 MG CAPS (BENZONATATE) two tabs by mouth three times a day  #90 x 1   Entered and Authorized by:   Lequita Asal  MD   Signed by:   Lequita Asal  MD on 07/09/2008   Method used:   Electronically to        [facility] [address] [phone]* (retail)       [address]       [place], [place]  [address]       Ph: [phone] or [phone]       Fax: [phone]   RxID:   [id number] PREDNISONE 20 MG TABS (PREDNISONE) take 3 tabs (60 mg) by mouth daily x7days.  #21 x 0   Entered and Authorized by:   Lequita Asal  MD   Signed by:   Lequita Asal  MD on 07/09/2008   Method used:   Electronically to        [facility] [address] [phone]* (retail)       [address]       [place], [place]  [address]       Ph: [phone] or [phone]       Fax: [phone]   RxID:   [id number] DOXYCYCLINE HYCLATE 100 MG CAPS (DOXYCYCLINE HYCLATE) Take 1 tab twice a day  #20 x 0   Entered and Authorized by:   Lequita Asal  MD   Signed by:   Lequita Asal  MD on 07/09/2008   Method used:   Electronically to        [facility] [address] [phone]* (retail)       [address]       [place], [place]  [address]       Ph: [phone] or [phone]       Fax: [phone]   RxID:   [id number]

## 2010-07-05 NOTE — Assessment & Plan Note (Signed)
Summary: drug intraction  received phone call from [place] from [employer] [facility] . states there is a drug interaction with Fluconazole and Simvastatin. will send message to MD. call back number [phone]. PATRICIA SMITH RN  April 03, 2008 10:46 AM  Called and discussed.  Interaction noted but only takes fluconazole for short time.  Will go ahead with Rx.

## 2010-07-05 NOTE — Progress Notes (Signed)
Summary: chng pharm  Phone Note Call from Patient Call back at [facility] Phone [phone]   Caller: Patient Summary of Call: rx was called intro wrong pharm - needs to go to [facility] Initial call taken by: De Nurse,  November 04, 2008 8:48 AM  Follow-up for Phone Call        rx for simvastatin called to [facility] [address]. [facility]. patient notified. Follow-up by: Theresia Lo RN,  November 04, 2008 9:12 AM

## 2010-07-05 NOTE — Miscellaneous (Signed)
Summary: Dr. Judeth Porch requesting call from Dr. Leveda Anna  Clinical Lists Changes  patient in for work in appointment today and states her GYN Dr. Mcneil Sober request Dr. Leveda Anna call him  to discuss patient and current medication she is on that may be causing weight gain. will send message to MD. Dr. Judeth Porch' office number is [phone]. Kelsey Lo RN  Nov 01, 2007 9:15 AM Attempted to call at 2:45 Fri.  Office is closed at noon.  Will try again next week.  Kelsey Bullock  Called again, Monday 6/1.  Left message.  Have not yet heard back from physician.  Kelsey Bullock  Dr. Judeth Porch returned my call on 6/2.  At his last visit with Kelsey Bullock, her weight was up yet again.  Also her BP was up and Kelsey Bullock admitted to not taking her meds due to cost ($4/generic)  We discussed strategies of care but had to admit that many of [name]'s underlying problems are beyond our control.  He told Kelsey Bullock that she needs to see me soon regarding her BP.

## 2010-07-05 NOTE — Progress Notes (Signed)
Summary: triage  Phone Note Call from Patient Call back at Home Phone [phone]   Caller: Patient Summary of Call: needs to come in today - congestion/asthma Initial call taken by: De Nurse,  March 01, 2010 8:41 AM  Follow-up for Phone Call        states she has chronic bronchitis & her inhalers are not working. sh will be here by 11 for a work in appt. aware of possible wait time Follow-up by: Golden Circle RN,  March 01, 2010 8:57 AM  Additional Follow-up for Phone Call Additional follow up Details #1::        noted and agree Additional Follow-up by: Doralee Albino MD,  March 01, 2010 9:27 AM
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
## 2010-07-05 NOTE — Assessment & Plan Note (Signed)
Summary: persistant cough   Vital Signs:  Patient Profile:   50 Years Old Female Weight:      339.3 pounds Temp:     98.8 degrees F Pulse rate:   77 / minute BP sitting:   115 / 78  Pt. in pain?   no  Vitals Entered By: Jone Baseman CMA (September 04, 2006 11:07 AM)                Chief Complaint:  persistant cough.  History of Present Illness: cough- h/o bronchitis and asthma per patient.  recently started on advair and albuterol with sebsequent decrease in cough.  ran out of meds and cough increased.  states cough started in september, usually worse in winter.  minimal sputum, mostly dry.  + wheeze, + night cough.  no tob but + exposure (father in law).  +hot flashes with menopausal symptoms, but no fevers.     Past Medical History:    Reviewed history from 08/02/2006 and no changes required:       gestational diabetes - at risk for DM Type II   Family History:    Reviewed history from 08/02/2006 and no changes required:       - CVA, + osteoporosis, CAD, Ca, DM, EtOHism, high Chol  Social History:    Reviewed history from 08/02/2006 and no changes required:       Lives in government housing in [place] no [facility]; Has an old Zenaida Niece for transportation; non smoker, non drinker; lives with children age 36,4, and 10 (in 2004) father of children has left the family     Physical Exam  General:     GEN: nad, alert and oriented HEENT: ncat, mucous membrans moist, tm wnl NECK: supple, no LA CV: rrr PULM: ctab, no inc wob, + dry cough ABD: soft, +bs, obese SKIN: no acute rash    Impression & Recommendations:  Problem # 1:  COUGH (ICD-786.2) Assessment: Deteriorated Advair Diskus (fluticasone-salmeterol) (Disk with Device 250-50 mcg/Dose) 1 puff twice a day Disp. 1 Rfl #6 (last: 09/04/2006)- sample given. albuterol mdi (Aerosol 90 mcg/Actuation) 2 puff every four hours as needed Disp. 1 Rfl #3 (last: 09/04/2006)    treat with above meds at chronic asthma/bronchitis.   given relationship to d/c of meds and increase in symptoms, this is most reasonable intervention at this time.  no emergent need for imaging now.  can consider imaging vs. ACE cough at f/u with PMD.  d/w patient tob exposure.  voices understanding.  no antibiotics/oral steroids.  Orders: [facility]- Est Level  3 (56433)    Patient Instructions: 1)  Please schedule a follow-up appointment in 2-4 weeks with Dr. Leveda Anna.  Your meds have been sent to the pharmacy.  Use advair twice a day.

## 2010-07-05 NOTE — Assessment & Plan Note (Signed)
Summary: [facility]   Vital Signs:  Patient profile:   50 year old female Weight:      390 pounds Temp:     98.6 degrees F oral Pulse rate:   77 / minute Pulse rhythm:   regular BP sitting:   142 / 79  (left arm) Cuff size:   large  Vitals Entered By: Loralee Pacas CMA (May 04, 2010 3:29 PM)  Serial Vital Signs/Assessments:  Time      Position  BP       Pulse  Resp  Temp     By                     132/78                         Doralee Albino MD  CC: meds   Primary Care Provider:  Doralee Albino MD  CC:  meds.  History of Present Illness: Note 15.5 lb wt gain in last two months Not active, eating more. Continues to have an external locus of control - feels powerless to do anything to change her life.  Asked her to make one change.  She said she would try to walk more.  Also, agree to nutrition referral  Habits & Providers  Alcohol-Tobacco-Diet     Alcohol drinks/day: 0     Tobacco Status: never     Passive Smoke Exposure: yes  Current Medications (verified): 1)  Qvar 80 Mcg/act Aers (Beclomethasone Dipropionate) .... Two Puffs Daily 2)  Proventil Hfa 108 (90 Base) Mcg/act Aers (Albuterol Sulfate) .... 2 Puffs Q4h As Needed Wheezing 3)  Amitriptyline Hcl 25 Mg Tabs (Amitriptyline Hcl) .... One Tab Three Times Daily 4)  Clonazepam 1 Mg Tabs (Clonazepam) .... Take 1 Tablet By Mouth Twice A Day 5)  Cyclobenzaprine Hcl 10 Mg Tabs (Cyclobenzaprine Hcl) .... Take 1 Tablet By Mouth Every Night As Needed For Back/neck Pain and Spasm 6)  Hydrochlorothiazide 25 Mg Tabs (Hydrochlorothiazide) .... One By Mouth Daily 7)  Norethindrone Acetate 5 Mg Tabs (Norethindrone Acetate) .... Take 1 Tablet By Mouth Once A Day 8)  Pepcid 20 Mg Tabs (Famotidine) .... One Tab Daily 9)  Simvastatin 80 Mg Tabs (Simvastatin) .Marland Kitchen.. 1 By Mouth At Bedtime 10)  Trazodone Hcl 100 Mg Tabs (Trazodone Hcl) .... Take 1 Tablet By Mouth At Bedtime 11)  Ibuprofen 800 Mg  Tabs (Ibuprofen) .... One Three Times A Day  As Needed For Pain 12)  Lisinopril 40 Mg Tabs (Lisinopril) .... One By Mouth Daily 13)  Triamcinolone Acetonide 0.1 % Oint (Triamcinolone Acetonide) .... Apply To Affected Area Two Times A Day Disp 30 Gram Tube 14)  Fluticasone Propionate 50 Mcg/act Susp (Fluticasone Propionate) .... 2 Puffs Each Nostril Daily.  Use Even When Nose Not Stuffy. 15)  Bupropion Hcl 100 Mg Xr12h-Tab (Bupropion Hcl) .... One By Mouth Qam Per Mental Health 16)  Mirtazapine 30 Mg Tabs (Mirtazapine) .... One By Mouth Daily Per Mental Health  Allergies (verified): 1)  ! Etodolac (Etodolac) 2)  Amoxicillin (Amoxicillin) 3)  Diclofenac Sodium (Diclofenac Sodium) 4)  Iodine (Iodine) 5)  Prilosec (Omeprazole) 6)  Protonix (Pantoprazole Sodium)  Past History:  Past medical, surgical, family and social histories (including risk factors) reviewed, and no changes noted (except as noted below).  Past Medical History: Reviewed history from 07/31/2007 and no changes required. gestational diabetes -  at risk for DM Type II fibromyalgia  Past Surgical History: Reviewed  history from 07/31/2007 and no changes required. Rt carpal tunnel release - 12/04/2002    Family History: Reviewed history from 07/31/2007 and no changes required. - CVA, + osteoporosis, CAD, Ca, DM, EtOHism, high Chol    Social History: Reviewed history from 10/30/2008 and no changes required. Now permenantly disabled secondary to depression; Has an old Zenaida Niece for transportation; non smoker, non drinker; lives with children age 54,4, and 70 (in 2004) father of children has left the family   Trying to eat healthier Exercise only walking with errands.     Physical Exam  General:  Well-developed,well-nourished,in no acute distress; alert,appropriate and cooperative throughout examination Neck:  No deformities, masses, or tenderness noted. Lungs:  Normal respiratory effort, chest expands symmetrically. Lungs are clear to auscultation, no crackles or wheezes.   Chest wall and abd limit ability to take a deep breath. Heart:  Normal rate and regular rhythm. S1 and S2 normal without gallop, murmur, click, rub or other extra sounds. Extremities:  trace bilateral edema (not enough to explain 15 lb wt gain.)   Impression & Recommendations:  Problem # 1:  ASTHMA, PERSISTENT, MODERATE (ICD-493.90)  Refilled provental Her updated medication list for this problem includes:    Qvar 80 Mcg/act Aers (Beclomethasone dipropionate) .Marland Kitchen..Marland Kitchen Two puffs daily    Proventil Hfa 108 (90 Base) Mcg/act Aers (Albuterol sulfate) .Marland Kitchen... 2 puffs q4h as needed wheezing  Orders: [facility]- Est  Level 4 ([address])  Problem # 2:  OBESITY, NOS (ICD-278.00) Assessment: Deteriorated  Orders: Nutrition Referral (Nutrition) [facility]- Est  Level 4 (284[address])  Problem # 3:  HYPERCHOLESTEROLEMIA (ICD-272.0) Time for labs - will get fasting since previous high triglycerides. Her updated medication list for this problem includes:    Simvastatin 80 Mg Tabs (Simvastatin) .Marland Kitchen... 1 by mouth at bedtime  Orders: [facility]- Est  Level 4 ([address])Future Orders: T-Lipid Profile ([id number]) ... 04/06/2011  Complete Medication List: 1)  Qvar 80 Mcg/act Aers (Beclomethasone dipropionate) .... Two puffs daily 2)  Proventil Hfa 108 (90 Base) Mcg/act Aers (Albuterol sulfate) .... 2 puffs q4h as needed wheezing 3)  Amitriptyline Hcl 25 Mg Tabs (Amitriptyline hcl) .... One tab three times daily 4)  Clonazepam 1 Mg Tabs (Clonazepam) .... Take 1 tablet by mouth twice a day 5)  Cyclobenzaprine Hcl 10 Mg Tabs (Cyclobenzaprine hcl) .... Take 1 tablet by mouth every night as needed for back/neck pain and spasm 6)  Hydrochlorothiazide 25 Mg Tabs (Hydrochlorothiazide) .... One by mouth daily 7)  Norethindrone Acetate 5 Mg Tabs (Norethindrone acetate) .... Take 1 tablet by mouth once a day 8)  Pepcid 20 Mg Tabs (Famotidine) .... One tab daily 9)  Simvastatin 80 Mg Tabs (Simvastatin) .Marland Kitchen.. 1 by mouth at bedtime 10)  Trazodone  Hcl 100 Mg Tabs (Trazodone hcl) .... Take 1 tablet by mouth at bedtime 11)  Ibuprofen 800 Mg Tabs (Ibuprofen) .... One three times a day as needed for pain 12)  Lisinopril 40 Mg Tabs (Lisinopril) .... One by mouth daily 13)  Triamcinolone Acetonide 0.1 % Oint (Triamcinolone acetonide) .... Apply to affected area two times a day disp 30 gram tube 14)  Fluticasone Propionate 50 Mcg/act Susp (Fluticasone propionate) .... 2 puffs each nostril daily.  use even when nose not stuffy. 15)  Bupropion Hcl 100 Mg Xr12h-tab (Bupropion hcl) .... One by mouth qam per mental health 16)  Mirtazapine 30 Mg Tabs (Mirtazapine) .... One by mouth daily per mental health  Other Orders: Future Orders: T-Comprehensive Metabolic Panel ([id number]) ... 04/06/2011 Prescriptions: PROVENTIL  HFA 108 (90 BASE) MCG/ACT AERS (ALBUTEROL SULFATE) 2 puffs q4h as needed wheezing  #1 x 12   Entered and Authorized by:   Doralee Albino MD   Signed by:   Doralee Albino MD on 05/04/2010   Method used:   Electronically to        [facility]. [address] [phone]* (retail)       [address]       [place], [place]  [address]       Ph: [phone] or [phone]       Fax: [phone]   RxID:   [id number]    Orders Added: 1)  T-Lipid Profile [id number] 2)  T-Comprehensive Metabolic Panel [[id number]] 3)  Nutrition Referral [Nutrition] 4)  [facility]- Est  Level 4 [59563]     Prevention & Chronic Care Immunizations   Influenza vaccine: Fluvax 3+  (03/16/2010)   Influenza vaccine due: 02/04/2011    Tetanus booster: 10/11/2006: Tdap   Tetanus booster due: 10/10/2016    Pneumococcal vaccine: Pneumovax  (04/29/2007)   Pneumococcal vaccine due: 04/28/2012  Other Screening   Pap smear: normal  (10/30/2007)   Pap smear due: 10/30/2010    Mammogram: ASSESSMENT: Negative - BI-RADS 1^MM DIGITAL SCREENING  (05/14/2009)   Mammogram due: 05/14/2010   Smoking status: never  (05/04/2010)  Lipids    Total Cholesterol: 249  (10/30/2008)   Lipid panel action/deferral: Lipid Panel ordered   LDL: 140  (10/30/2008)   LDL Direct: 95  (07/23/2009)   HDL: 40  (10/30/2008)   Triglycerides: 346  (10/30/2008)    SGOT (AST): 11  (07/23/2009)   BMP action: Ordered   SGPT (ALT): 11  (07/23/2009) CMP ordered    Alkaline phosphatase: 78  (07/23/2009)   Total bilirubin: 0.5  (07/23/2009)    Lipid flowsheet reviewed?: Yes   Progress toward LDL goal: At goal  Hypertension   Last Blood Pressure: 142 / 79  (05/04/2010)   Serum creatinine: 0.73  (07/23/2009)   BMP action: Ordered   Serum potassium 4.1  (07/23/2009) CMP ordered     Hypertension flowsheet reviewed?: Yes   Progress toward BP goal: At goal  Self-Management Support :   Personal Goals (by the next clinic visit) :      Personal blood pressure goal: 140/90  (03/10/2009)     Personal LDL goal: 130  (03/10/2009)   Referred.    Hypertension self-management support: Written self-care plan  (07/23/2009)    Lipid self-management support: Written self-care plan  (07/23/2009)

## 2010-07-05 NOTE — Assessment & Plan Note (Signed)
Summary: F/U ALLERGIES [facility]   Vital Signs:  Patient Profile:   50 Years Old Female Weight:      337 pounds Temp:     97.8 degrees F Pulse rate:   84 / minute BP sitting:   110 / 75  (right arm)  Pt. in pain?   yes    Location:   knees    Intensity:   8    Type:       aching  Vitals Entered By: Theresia Lo RN (September 28, 2006 1:43 PM)              Is Patient Diabetic? No   Chief Complaint:  f/u cough.  History of Present Illness: Cough, hx of asthma.  Cough since Sept.  C/O night cough.  Nose stays stopped up.    C/O yeast infection on stomach  Rt and Lt arm pain and numbness.  S/P Rt arm carpal tunnel surg  Life still chaotic.  No serious attempt at weight loss.  Still with external locus of control  Applying for disability, recent evaluation.  Difficult for me to pars out what complaints are fibromyalgia, what are obesity, what are reverable pathology and what are needed for disability    Past Medical History:    Reviewed history from 08/02/2006 and no changes required:       gestational diabetes -        at risk for DM Type II  Past Surgical History:    Reviewed history from 08/02/2006 and no changes required:       Rt carpal tunnel release - 12/04/2002   Family History:    Reviewed history from 08/02/2006 and no changes required:       - CVA, + osteoporosis, CAD, Ca, DM, EtOHism, high Chol  Social History:    Reviewed history from 08/02/2006 and no changes required:       Lives in government housing in [place] no [facility]; Has an old Zenaida Niece for transportation; non smoker, non drinker; lives with children age 34,4, and 53 (in 2004) father of children has left the family   Risk Factors:  Tobacco use:  never    Physical Exam  General:     obese.  Difficulty getting on exam table due to knee pain. Lungs:     Normal respiratory effort, chest expands symmetrically. Lungs are clear to auscultation, no crackles or wheezes. Extremities:     Pain at knee joint  line.  Diffuse pain of hands - non anatomic pattern    Impression & Recommendations:  Problem # 1:  COUGH (ICD-786.2) Probable worsening asthma Increased advair dose in Dr. Tiajuana Amass Orders: Radiology other (Radiology Other) [facility]- Est  Level 4 ([address])   Problem # 2:  CANDIDIASIS, SKIN/NAILS (ICD-112.3) Retreat with fluconizole which has worked in the past Orders: [facility]- Est  Level 4 ([address])   Problem # 3:  OBESITY, NOS (ICD-278.00) This is the cause of the majority of her musculoskeletal complaints Orders: [facility]- Est  Level 4 ([address]214)   Problem # 4:  OSTEOARTHRITIS, LOWER LEG (ICD-715.96) continue current meds Orders: [facility]- Est  Level 4 (09811)   Problem # 5:  FIBROMYALGIA, FIBROMYOSITIS (ICD-729.1) Continue current meds    Patient Instructions: 1)  1. Increased your asthma med dose.  Also will check Chest X-ray 2)  2. New prescription for yeast medicine 3)  3. New Rx for cholesterol 4)  2. See me in two months

## 2010-07-05 NOTE — Consult Note (Signed)
Summary: [facility] Sleep Study  [facility] Sleep Study   Imported By: De Nurse 05/12/2009 10:57:55  _____________________________________________________________________  External Attachment:    Type:   Image     Comment:   External Document

## 2010-07-05 NOTE — Letter (Signed)
Summary: *Referral Letter  [facility]  [address]   [place], [place] [address]   Phone: [phone]  Fax:     11/26/2006  I am the family physician for Kelsey Bullock and her children.  Cola has asked me to provide this letter to support 1. her moving to a different place 2. to be provided with transportation and 3. to have air conditioning in her dwelling.  Below are Alizzon's documented medical problems.  Obviously, they are numerous.  She has three young children, all of whom have significant behavioral and adjustment problems.  This family has a very difficult life.  I can't imagine this family traveling on a city bus.  On top of that, her children attend schools in [place].  [name]'s respiratory condition makes it difficult for her to tolerate the heat.  I think it would be in [place] and her family's best interest if all three of these requests were granted.  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  Please contact me if you have any further questions or need additional  information.  Sincerely,  Doralee Albino MD

## 2010-07-05 NOTE — Consult Note (Signed)
Summary: [facility]  [facility]   Imported By: Knox Royalty 01/15/2008 15:15:25  _____________________________________________________________________  External Attachment:    Type:   Image     Comment:   External Document

## 2010-07-05 NOTE — Progress Notes (Signed)
Summary: Triage  Phone Note Call from Patient Call back at Home Phone [phone]   Summary of Call: Pt is calling to speak with a nurse about not feeling any better.  She states her shoulder is still hurting and would like to know what she can do at home to help. Initial call taken by: Haydee Salter,  July 30, 2007 2:22 PM  Follow-up for Phone Call        reports upper back , neck and rt shoulder pain for 2 weeks. feels very tight in upper back. appointment scheduled tomorrow. Follow-up by: Theresia Lo RN,  July 30, 2007 2:41 PM

## 2010-07-05 NOTE — Letter (Signed)
Summary: Generic Letter  [facility]  [address]   [place], [place] [address]   Phone: [phone]  Fax: [phone]    07/08/2007  Catherina Palomo [address] [place], [place]  [address]  Dear Ms. Leggio,           Sincerely,   Tanyia Grabbe RN [facility]

## 2010-07-05 NOTE — Miscellaneous (Signed)
Summary: Asthma clarifcation  Clinical Lists Changes  Problems: Changed problem from ASTHMA NOS W/O STATUS ASTHMATICUS (ICD-493.90) to ASTHMA, PERSISTENT, MODERATE (ICD-493.90)

## 2010-07-05 NOTE — Progress Notes (Signed)
Summary: status of paperwork  Medications Added ADVAIR DISKUS 250-50 MCG/DOSE MISC (FLUTICASONE-SALMETEROL) Inhale 1 puff as directed twice a day ALBUTEROL 90 MCG/ACT AERS (ALBUTEROL) Inhale 2 puff using inhaler every four hours AMITRIPTYLINE HCL 25 MG TABS (AMITRIPTYLINE HCL)  CLONAZEPAM 1 MG TABS (CLONAZEPAM) Take 1 tablet by mouth twice a day CYCLOBENZAPRINE HCL 10 MG TABS (CYCLOBENZAPRINE HCL) Take 1 tablet by mouth every night FERROUS SULFATE 325 (65 FE) MG TABS (FERROUS SULFATE) Take 1 tablet by mouth once a day FLUCONAZOLE 100 MG TABS (FLUCONAZOLE) 1 tablet by mouth once a day FLUOXETINE HCL 20 MG CAPS (FLUOXETINE HCL) Take 1 capsule by mouth once a day HYDROCHLOROTHIAZIDE 25 MG TABS (HYDROCHLOROTHIAZIDE)  HYDROCORTISONE 2.5 % OINT (HYDROCORTISONE) Apply a small amount to skin every night LISINOPRIL 10 MG TABS (LISINOPRIL) Take 1 tablet by mouth once a day NORETHINDRONE ACETATE 5 MG TABS (NORETHINDRONE ACETATE) Take 1 tablet by mouth once a day PEPCID 20 MG TABS (FAMOTIDINE)  PROPOXYPHENE N-APAP 100-650 MG TABS (PROPOXYPHENE N-APAP) 1 tablet by mouth twice a day SIMVASTATIN 20 MG TABS (SIMVASTATIN) Take 1 tablet by mouth every night TRAZODONE HCL 100 MG TABS (TRAZODONE HCL) Take 1 tablet by mouth at bedtime      Allergies Added: AMOXICILLIN (AMOXICILLIN) DICLOFENAC SODIUM (DICLOFENAC SODIUM) IODINE (IODINE) PRILOSEC (OMEPRAZOLE) PROTONIX (PANTOPRAZOLE SODIUM) Phone Note Call from Patient Call back at [phone]   Reason for Call: Talk to Nurse Summary of Call: checking status of paperwork for section 8 housing Initial call taken by: ERIN LEVAN,  February 05, 2007 2:02 PM  Follow-up for Phone Call        I believe I have filled out and sent.  I do not have any pending paperwork for her in my box. Follow-up by: Doralee Albino MD,  February 06, 2007 10:01 AM  Additional Follow-up for Phone Call Additional follow up Details #1::        Would like to speak with someone  about this. Additional Follow-up by: Haydee Salter,  February 06, 2007 2:44 PM   New Allergies: AMOXICILLIN (AMOXICILLIN) DICLOFENAC SODIUM (DICLOFENAC SODIUM) IODINE (IODINE) PRILOSEC (OMEPRAZOLE) PROTONIX (PANTOPRAZOLE SODIUM) Additional Follow-up for Phone Call Additional follow up Details #2::    pt is calling again re: paperwork, she wants to know if the paperwork was sent to her or to the housing authority? Follow-up by: Denny Peon LEVAN,  February 06, 2007 3:44 PM  Additional Follow-up for Phone Call Additional follow up Details #3:: Details for Additional Follow-up Action Taken: LM telling pt that dr Leveda Anna has filled out and sent back Additional Follow-up by: Lillia Pauls CMA,  February 07, 2007 9:14 AM  New/Updated Medications: ADVAIR DISKUS 250-50 MCG/DOSE MISC (FLUTICASONE-SALMETEROL) Inhale 1 puff as directed twice a day ALBUTEROL 90 MCG/ACT AERS (ALBUTEROL) Inhale 2 puff using inhaler every four hours AMITRIPTYLINE HCL 25 MG TABS (AMITRIPTYLINE HCL)  CLONAZEPAM 1 MG TABS (CLONAZEPAM) Take 1 tablet by mouth twice a day CYCLOBENZAPRINE HCL 10 MG TABS (CYCLOBENZAPRINE HCL) Take 1 tablet by mouth every night FERROUS SULFATE 325 (65 FE) MG TABS (FERROUS SULFATE) Take 1 tablet by mouth once a day FLUCONAZOLE 100 MG TABS (FLUCONAZOLE) 1 tablet by mouth once a day FLUOXETINE HCL 20 MG CAPS (FLUOXETINE HCL) Take 1 capsule by mouth once a day HYDROCHLOROTHIAZIDE 25 MG TABS (HYDROCHLOROTHIAZIDE)  HYDROCORTISONE 2.5 % OINT (HYDROCORTISONE) Apply a small amount to skin every night LISINOPRIL 10 MG TABS (LISINOPRIL) Take 1 tablet by mouth once a day NORETHINDRONE ACETATE 5  MG TABS (NORETHINDRONE ACETATE) Take 1 tablet by mouth once a day PEPCID 20 MG TABS (FAMOTIDINE)  PROPOXYPHENE N-APAP 100-650 MG TABS (PROPOXYPHENE N-APAP) 1 tablet by mouth twice a day SIMVASTATIN 20 MG TABS (SIMVASTATIN) Take 1 tablet by mouth every night TRAZODONE HCL 100 MG TABS (TRAZODONE HCL) Take 1 tablet by  mouth at bedtime New Allergies: AMOXICILLIN (AMOXICILLIN) DICLOFENAC SODIUM (DICLOFENAC SODIUM) IODINE (IODINE) PRILOSEC (OMEPRAZOLE) PROTONIX (PANTOPRAZOLE SODIUM)     Appended Document: status of paperwork I tried to call again 02/11/07.  Left message that I will need form or further instructions as to what specifically she needs.  Appended Document: status of paperwork Pt is returning call to speak with Dr. Leveda Anna - Her call back number is [phone].  Appended Document: status of paperwork Called and explained in our new system we do not have a copy.  She will call housing to see if they received the form that I am 90% sure that I sent.

## 2010-07-05 NOTE — Progress Notes (Signed)
Summary: [place] request  Phone Note Call from Patient Call back at [phone]   Summary of Call: pt states her foot has gotten worse since yesterdays appt Initial call taken by: Haydee Salter,  Oct 11, 2006 11:11 AM  Follow-up for Phone Call        Spoke with pt, states since speaking with triage nurse yesterday foot is getting worse.  Becoming more black and blue, and painful.  Will work in with Dr. Dayton Martes.  Follow-up by: Chelan Heringer MARTIN RN,  Oct 11, 2006 11:31 AM
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
## 2010-07-05 NOTE — Progress Notes (Signed)
Summary: cryotherapy scan fell off  Phone Note Call from Patient   Summary of Call: Patient's scab fell off from where she had seborrheic keratosis frozen off over a week ago.  She bumped it and did not mean for it to fall off and wanted to make sure it was ok.  No signs of infection.  Advised normal care, may shower, may use neosporin and cover with bandaid tongiht if she wants.  Since I took off mole, reviewed letter I sent for continued surveillance and to report any suspicious skin lesions to PCP for recheck.  Patient agreed. Initial call taken by: Delbert Harness MD,  August 01, 2009 4:13 PM

## 2010-07-05 NOTE — Letter (Signed)
Summary: *Referral Letter  [facility]  [address]   [place], [place] [address]   Phone: [phone]  Fax: [phone]    08/26/2008  Thank you in advance for agreeing to see my patient:  Kelsey Bullock [address] [place], [place]  [address]  Phone: [phone]  To lawyer/judge for Archie Atilano disability case,   I have been Mardy's family physician for more than five years.  I am providing this letter at [employer] request to support her disability application.  She has a host of problems as indicated by her problem list below.  The problems are both significant medical problems and significant psychiatric problems.     Compounding her own problems, Scarlettrose is the single parent of three children ages 82, 26 and 62 with significant emotional needs of their own.  Each is followed by mental health and each is on medications for these emotional problems.  The children (except for the oldest, Ariel) are not thriving in school and the two boys are receiving intensive in home therapy through [facility].   While I doubt that any single problem that Onnika has raises to the level of permenant disability, I will frankly say that Crissy is overwhelmed by the totality of the problems.  I do not feel she can work.  I feel the cumulative effect of all her problems make her totally disabled.     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) 13)  OSTEOARTHRITIS, LOWER LEG  (ICD-715.96) 14)  OBESITY, NOS (ICD-278.00) 15)  NEPHROLITHIASIS (ICD-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) 16)  MENSTRUATION, PAINFUL (ICD-625.3) 17)  HYPERTENSION, BENIGN SYSTEMIC (ICD-401.1) 18)  HYPERCHOLESTEROLEMIA (ICD-272.0) 19)  HEMORRHOIDS, NOS (ICD-455.6) 20)  FIBROMYALGIA, FIBROMYOSITIS (ICD-729.1) 21)  CARPAL TUNNEL SYNDROME (ICD-354.0) 22)  ANXIETY (ICD-300.00)   Current Medications: 1)  ADVAIR DISKUS 500-50 MCG/DOSE  MISC (FLUTICASONE-SALMETEROL) one puff twice daily 2)  ALBUTEROL 90 MCG/ACT AERS (ALBUTEROL) Inhale 2 puff using inhaler every four hours 3)  AMITRIPTYLINE HCL 25 MG TABS (AMITRIPTYLINE HCL) one tab three times daily 4)  CLONAZEPAM 1 MG TABS (CLONAZEPAM) Take 1 tablet by mouth twice a day 5)  CYCLOBENZAPRINE HCL 10 MG TABS (CYCLOBENZAPRINE HCL) Take 1 tablet by mouth every night as needed for back/neck pain and spasm 6)  FLUCONAZOLE 100 MG TABS (FLUCONAZOLE) 1 tablet by mouth once a day 7)  FLUOXETINE HCL 40 MG  CAPS (FLUOXETINE HCL) two caps daily 8)  HYDROCHLOROTHIAZIDE 25 MG TABS (HYDROCHLOROTHIAZIDE) one by mouth daily 9)  LISINOPRIL 10 MG TABS (LISINOPRIL) Take 1 tablet by mouth once a day 10)  NORETHINDRONE ACETATE 5 MG TABS (NORETHINDRONE ACETATE) Take 1 tablet by mouth once a day 11)  PEPCID 20 MG TABS (FAMOTIDINE) one tab daily 12)  SIMVASTATIN 20 MG TABS (SIMVASTATIN) Take 1 tablet by mouth every night 13)  TRAZODONE HCL 100 MG TABS (TRAZODONE HCL) Take 1 tablet by mouth at bedtime 14)  IBUPROFEN 800 MG  TABS (IBUPROFEN) one three times a day as needed for pain 15)  GEODON 40 MG  CAPS (ZIPRASIDONE HCL) one by mouth with  supper as needed 16)  TESSALON PERLES 100 MG CAPS (BENZONATATE) two tabs by mouth three times a day   Past Medical History: 1)  gestational diabetes - 2)   at risk for DM Type II 3)  fibromyalgia    Thank you again for agreeing to see our patient; please contact us if you have any further questions or need additional  information.  Sincerely,      Doralee Albino MD

## 2010-07-05 NOTE — Letter (Signed)
Summary: *Referral Letter  [facility]  [address]   [place], [place] [address]   Phone: [phone]  Fax: [phone]    07/17/2007  Kelsey Bullock [address] [place], [place]  [address]  Phone: [phone]  To Trial [employer],   Please excuse [name] from jury duty.  In addition to being a single parent of special needs children, she has a long list of medical and emotional problems.  I doubt that she could be an attentive juror and serving as a juror would be injurious to her health.  Because I have listed her medical problems below, please treat this letter as confidential patient information.    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

## 2010-07-05 NOTE — Assessment & Plan Note (Signed)
Summary: stepped on metal/[facility]   Vital Signs:  Patient Profile:   50 Years Old Female Weight:      340 pounds Temp:     98.6 degrees F Pulse rate:   71 / minute BP sitting:   126 / 73  Pt. in pain?   yes    Location:   right foot    Intensity:   9  Vitals Entered By: Jone Baseman CMA (Oct 11, 2006 11:41 AM)                Visit Type:  work in PCP:  Doralee Albino MD  Chief Complaint:  stepped on something metal.  History of Present Illness: Ms. Riebe presents to work in clinic with complaint of "stepping on nail."  She reports that she stepped on the nail two days ago and that the incision is painful but denies any other symptoms.  She thinks her last Tetanus shot was 3 or 4 years ago in [place].  She denies any new muscle aches, fevers, or jaw problems.    Past Medical History:    Reviewed history from 09/28/2006 and no changes required:       gestational diabetes -        at risk for DM Type II   Family History:    Reviewed history from 08/02/2006 and no changes required:       - CVA, + osteoporosis, CAD, Ca, DM, EtOHism, high Chol  Social History:    Reviewed history from 08/02/2006 and no changes required:       Lives in government housing in [place] no [facility]; Has an old Zenaida Niece for transportation; non smoker, non drinker; lives with children age 72,4, and 1 (in 2004) father of children has left the family    Review of Systems      See HPI  The patient denies fever.    Derm      Denies poor wound healing.      no drainage from wound   Physical Exam  General:     Well-developed,well-nourished,in no acute distress; alert,appropriate and cooperative throughout examination Lungs:     Normal respiratory effort, chest expands symmetrically. Lungs are clear to auscultation, no crackles or wheezes. Heart:     Normal rate and regular rhythm. S1 and S2 normal without gallop, murmur, click, rub or other extra sounds. Extremities:     Pain at knee joint  line.  Diffuse pain of hands - non anatomic pattern Skin:     1 inch gash on right sole of foot, mild bruising around wound, no drainage, no erythema.    Impression & Recommendations:  Problem # 1:  LACERATION, FOOT (ICD-892.0) Appears to be healing well.  Since tetanus history unsure, will give tetanus booster.  No symptoms of tetanus at this time.  Advised to follow up with PCP if sx persist or worsen. Orders: [facility]- Est Level  3 (32951)   Other Orders: Tdap => 68yrs IM (88416) Admin 1st Vaccine (60630)    Tetanus/Td Vaccine    Vaccine Type: Tdap    Site: left deltoid    Mfr: sanfi pastuer    Dose: 0.5 ml    Route: IM    Given by: Jone Baseman CMA    Exp. Date: 12/20/2008    Lot #: [id number]    VIS given: 12/14/04 version given Oct 11, 2006.

## 2010-07-05 NOTE — Miscellaneous (Signed)
Summary: needs rx refill  Clinical Lists Changes Pt was in office today with son for an immunization update, and she asked for a refill on her advair- she brought the inhaler with her - she uses 500/50.  Advised will send request to Dr. Leveda Anna. ..................................................................Marland KitchenAMY MARTIN RN  June 19, 2007 11:36 AM   Pt uses [facility] on [address] in [place] pt. States she has used the 500/50 for a while, but she started out on the 250/50. ..................................................................Marland KitchenAMY MARTIN RN  June 19, 2007 12:20 PM  Medications: Changed medication from ADVAIR DISKUS 250-50 MCG/DOSE MISC (FLUTICASONE-SALMETEROL) Inhale 1 puff as directed twice a day to ADVAIR DISKUS 500-50 MCG/DOSE  MISC (FLUTICASONE-SALMETEROL) one puff twice daily - Signed Rx of ADVAIR DISKUS 500-50 MCG/DOSE  MISC (FLUTICASONE-SALMETEROL) one puff twice daily;  #1 x 12;  Signed;  Entered by: Doralee Albino MD;  Authorized by: Doralee Albino MD;  Method used: Electronic    Prescriptions: ADVAIR DISKUS 500-50 MCG/DOSE  MISC (FLUTICASONE-SALMETEROL) one puff twice daily  #1 x 12   Entered and Authorized by:   Doralee Albino MD   Signed by:   Doralee Albino MD on 06/19/2007   Method used:   Electronically sent to ...       [facility]       [address]       [facility]       [place], [place]  [address]       Ph: [phone]       Fax: [phone]   RxID:   [id number]

## 2010-07-05 NOTE — Assessment & Plan Note (Signed)
Summary: flu?,df   Vital Signs:  Patient profile:   50 year old female Weight:      376 pounds O2 Sat:      98 % on Room air Temp:     98.8 degrees F oral Pulse rate:   86 / minute BP sitting:   134 / 71  (left arm)  Vitals Entered By: Arlyss Repress CMA, (July 08, 2009 11:16 AM)  O2 Flow:  Room air CC: problems with breathing. cough, chills and body aches x 1 1/2 weeks. Is Patient Diabetic? No Pain Assessment Patient in pain? no        Primary Care Provider:  Doralee Albino MD  CC:  problems with breathing. cough and chills and body aches x 1 1/2 weeks.Marland Kitchen  History of Present Illness: CC: cough  Patient with h/o asthma and bronchitis and fibromyalgia.  2 wk hisotry of feeling poorly.  Started with chest congestion and sneezing and rhinorrhea (clear).  Cough productive, + ear pain and body aches.  no abd pain, nausea, vomiting.  No sick contacts.  No fevers.  Did note last night popping sensation in ears.  Inhaler helps.  also tried mucinex which helped her sleep.  Habits & Providers  Alcohol-Tobacco-Diet     Tobacco Status: never     Passive Smoke Exposure: yes  Current Medications (verified): 1)  Qvar 80 Mcg/act Aers (Beclomethasone Dipropionate) .... Two Puffs Daily 2)  Proventil Hfa 108 (90 Base) Mcg/act Aers (Albuterol Sulfate) .... 2 Puffs Q4h As Needed Wheezing 3)  Amitriptyline Hcl 25 Mg Tabs (Amitriptyline Hcl) .... One Tab Three Times Daily 4)  Clonazepam 1 Mg Tabs (Clonazepam) .... Take 1 Tablet By Mouth Twice A Day 5)  Cyclobenzaprine Hcl 10 Mg Tabs (Cyclobenzaprine Hcl) .... Take 1 Tablet By Mouth Every Night As Needed For Back/neck Pain and Spasm 6)  Fluconazole 100 Mg Tabs (Fluconazole) .Marland Kitchen.. 1 Tablet By Mouth Once A Day 7)  Fluoxetine Hcl 40 Mg  Caps (Fluoxetine Hcl) .... Two Caps Daily 8)  Hydrochlorothiazide 25 Mg Tabs (Hydrochlorothiazide) .... One By Mouth Daily 9)  Lisinopril 20 Mg Tabs (Lisinopril) .Marland Kitchen.. 1 Tablet By Mouth Daily 10)  Norethindrone  Acetate 5 Mg Tabs (Norethindrone Acetate) .... Take 1 Tablet By Mouth Once A Day 11)  Pepcid 20 Mg Tabs (Famotidine) .... One Tab Daily 12)  Simvastatin 80 Mg Tabs (Simvastatin) .Marland Kitchen.. 1 By Mouth At Bedtime 13)  Trazodone Hcl 100 Mg Tabs (Trazodone Hcl) .... Take 1 Tablet By Mouth At Bedtime 14)  Ibuprofen 800 Mg  Tabs (Ibuprofen) .... One Three Times A Day As Needed For Pain 15)  Lisinopril 40 Mg Tabs (Lisinopril) .... One By Mouth Daily 16)  Triamcinolone Acetonide 0.1 % Oint (Triamcinolone Acetonide) .... Apply To Affected Area Two Times A Day Disp 30 Gram Tube  Allergies (verified): 1)  ! Etodolac (Etodolac) 2)  Amoxicillin (Amoxicillin) 3)  Diclofenac Sodium (Diclofenac Sodium) 4)  Iodine (Iodine) 5)  Prilosec (Omeprazole) 6)  Protonix (Pantoprazole Sodium)  Past History:  Past medical, surgical, family and social histories (including risk factors) reviewed for relevance to current acute and chronic problems.  Past Medical History: Reviewed history from 07/31/2007 and no changes required. gestational diabetes -  at risk for DM Type II fibromyalgia  Past Surgical History: Reviewed history from 07/31/2007 and no changes required. Rt carpal tunnel release - 12/04/2002    Family History: Reviewed history from 07/31/2007 and no changes required. - CVA, + osteoporosis, CAD, Ca, DM, EtOHism,  high Chol    Social History: Reviewed history from 10/30/2008 and no changes required. Now permenantly disabled secondary to depression; Has an old Zenaida Niece for transportation; non smoker, non drinker; lives with children age 60,4, and 4 (in 2004) father of children has left the family   Trying to eat healthier Exercise only walking with errands.   Passive Smoke Exposure:  yes  Physical Exam  General:  tired, NAD.  coughing.  AFVSS Eyes:  No corneal or conjunctival inflammation noted. EOMI. Perrla. Ears:  R TM WNL and canal as well. L TM with perforation anterior portion, ertyhema around perf.   canal clear Nose:  External nasal examination shows no deformity or inflammation. + mucous in nares Mouth:  pharynx erythematous Neck:  No deformities, masses, or tenderness noted. Lungs:  no crackles, wheezing, decreased air movement 2/2 body habitus Heart:  Normal rate and regular rhythm. S1 and S2 normal without gallop, murmur, click, rub or other extra sounds. Extremities:  1+ edema bilaterally   Impression & Recommendations:  Problem # 1:  COUGH (ICD-786.2)  likely viral URTI, discussed this, and how cough can last weeks to resolve (patient with chronic cough).  Provided with alb refill (pt has been using more recently) and script for codeine cough syrup with guaifenesin.  Wanted to use decongestant but hesitant given on BP meds.  MOre fluids, more Vit C, more rest.  Red flags to return discussed.  Possibly flu, but outside of window for intervention.  No evidence of bacterial superinfection.  Orders: [facility]- Est Level  3 (99213)  Problem # 2:  TYMPANIC MEMBRANE PERFORATION, LEFT EAR (ICD-384.20)  likely after barotrauma last night from inability to diminish congestion/pressure.  RTC 1-2 wks for f/u ear.  Orders: [facility]- Est Level  3 (62831)  Complete Medication List: 1)  Qvar 80 Mcg/act Aers (Beclomethasone dipropionate) .... Two puffs daily 2)  Proventil Hfa 108 (90 Base) Mcg/act Aers (Albuterol sulfate) .... 2 puffs q4h as needed wheezing 3)  Amitriptyline Hcl 25 Mg Tabs (Amitriptyline hcl) .... One tab three times daily 4)  Clonazepam 1 Mg Tabs (Clonazepam) .... Take 1 tablet by mouth twice a day 5)  Cyclobenzaprine Hcl 10 Mg Tabs (Cyclobenzaprine hcl) .... Take 1 tablet by mouth every night as needed for back/neck pain and spasm 6)  Fluconazole 100 Mg Tabs (Fluconazole) .Marland Kitchen.. 1 tablet by mouth once a day 7)  Fluoxetine Hcl 40 Mg Caps (Fluoxetine hcl) .... Two caps daily 8)  Hydrochlorothiazide 25 Mg Tabs (Hydrochlorothiazide) .... One by mouth daily 9)  Lisinopril 20 Mg Tabs  (Lisinopril) .Marland Kitchen.. 1 tablet by mouth daily 10)  Norethindrone Acetate 5 Mg Tabs (Norethindrone acetate) .... Take 1 tablet by mouth once a day 11)  Pepcid 20 Mg Tabs (Famotidine) .... One tab daily 12)  Simvastatin 80 Mg Tabs (Simvastatin) .Marland Kitchen.. 1 by mouth at bedtime 13)  Trazodone Hcl 100 Mg Tabs (Trazodone hcl) .... Take 1 tablet by mouth at bedtime 14)  Ibuprofen 800 Mg Tabs (Ibuprofen) .... One three times a day as needed for pain 15)  Lisinopril 40 Mg Tabs (Lisinopril) .... One by mouth daily 16)  Triamcinolone Acetonide 0.1 % Oint (Triamcinolone acetonide) .... Apply to affected area two times a day disp 30 gram tube 17)  Guaifenesin-codeine 200-10 Mg/59ml Liqd (Guaifenesin-codeine) .... Take one by mouth q4-6 hours as needed cough  Patient Instructions: 1)  Please return in 2 wks for follow up of perforated eardrum, sooner if not improving as expected. 2)  Cough  syrup to help with the cough. 3)  Start vitamin C / OJ to help. 4)  I've refilled your albuterol. 5)  Call clinic with questions. Prescriptions: PROVENTIL HFA 108 (90 BASE) MCG/ACT AERS (ALBUTEROL SULFATE) 2 puffs q4h as needed wheezing  #6.7 Gram x 3   Entered and Authorized by:   Eustaquio Boyden  MD   Signed by:   Eustaquio Boyden  MD on 07/08/2009   Method used:   Electronically to        [facility] [address] [phone]* (retail)       [address]       [place], [place]  [address]       Ph: [phone] or [phone]       Fax: [phone]   RxID:   [id number] PROVENTIL HFA 108 (90 BASE) MCG/ACT AERS (ALBUTEROL SULFATE) 2 puffs q4h as needed wheezing  #6.7 Gram x 3   Entered and Authorized by:   Eustaquio Boyden  MD   Signed by:   Eustaquio Boyden  MD on 07/08/2009   Method used:   Electronically to        [facility] (retail)       [address]       [facility]       [place], [place]  [address]       Ph: [phone]       Fax: [phone]   RxID:   [id number]
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
## 2010-07-05 NOTE — Miscellaneous (Signed)
Summary: Consent for shave biopsy  Consent for shave biopsy   Imported By: Knox Royalty 11/11/2009 10:05:51  _____________________________________________________________________  External Attachment:    Type:   Image     Comment:   External Document

## 2010-07-05 NOTE — Miscellaneous (Signed)
  Clinical Lists Changes  Medications: Changed medication from PEPCID 20 MG TABS (FAMOTIDINE) to PEPCID 20 MG TABS (FAMOTIDINE) one tab daily

## 2010-07-05 NOTE — Progress Notes (Signed)
Summary: Requesting fax for gas voucher  Phone Note Call from Patient Call back at Home Phone [phone]   Reason for Call: Talk to Nurse Summary of Call: pt is requesting we fax [facility] something stating pt was here for an appt so pt may get a gas voucher, fax to  [phone] Initial call taken by: ERIN LEVAN,  July 08, 2007 3:12 PM  Follow-up for Phone Call        pt states Dana Allan works with housing office for [employer] and helps provide transportation for pt . states we have always faxed  note in  the past. consulted  with Dennison Nancy RN. and advises may send as requested. letter faxed. Follow-up by: Theresia Lo RN,  July 08, 2007 4:46 PM

## 2010-07-05 NOTE — Assessment & Plan Note (Signed)
Summary: uti s/s & cold s/s   Vital Signs:  Patient profile:   50 year old female Weight:      355 pounds Pulse rate:   90 / minute BP sitting:   141 / 90  (left arm)  Vitals Entered By: Arlyss Repress CMA, (Oct 12, 2008 11:04 AM) CC: dysuria since friday Is Patient Diabetic? No Pain Assessment Patient in pain? yes        History of Present Illness: Patient presents with burning with urination, urgency, frequency, and suprapubic pain x 3 days.  Denies N/V/D/C.  No documented fevers, woke up sweaty today.  No vaginal discharge.  Reports urine is orange.  Habits & Providers     Tobacco Status: never  Allergies: 1)  ! Etodolac (Etodolac) 2)  Amoxicillin (Amoxicillin) 3)  Diclofenac Sodium (Diclofenac Sodium) 4)  Iodine (Iodine) 5)  Prilosec (Omeprazole) 6)  Protonix (Pantoprazole Sodium)  Physical Exam  General:  Well-developed,well-nourished,in no acute distress; alert,appropriate and cooperative throughout examination   obese Abdomen:  soft, tender to palpation in suprapubic region, no rebound or guarding.  Normal bowel sounds.   No CVA tenderness   Impression & Recommendations:  Problem # 1:  DYSURIA (ICD-788.1) Assessment New UA with few WBCs and few RBCs, not entirely convincing of UTI, but patient with very suggestive symptoms.  Will culture urine and treat with nitrofurantoin.   Her updated medication list for this problem includes:    Nitrofurantoin Macrocrystal 100 Mg Caps (Nitrofurantoin macrocrystal) .Marland Kitchen... 1 by mouth two times a day x 7 days  Orders: Urinalysis-[facility] (00000) Urine Culture-[facility] ([address]) [facility]- Est Level  3 (09811)  Complete Medication List: 1)  Advair Diskus 500-50 Mcg/dose Misc (Fluticasone-salmeterol) .... One puff twice daily 2)  Proventil Hfa 108 (90 Base) Mcg/act Aers (Albuterol sulfate) .... 2 puffs q4h as needed wheezing 3)  Amitriptyline Hcl 25 Mg Tabs (Amitriptyline hcl) .... One tab three times daily 4)  Clonazepam 1 Mg Tabs  (Clonazepam) .... Take 1 tablet by mouth twice a day 5)  Cyclobenzaprine Hcl 10 Mg Tabs (Cyclobenzaprine hcl) .... Take 1 tablet by mouth every night as needed for back/neck pain and spasm 6)  Fluconazole 100 Mg Tabs (Fluconazole) .Marland Kitchen.. 1 tablet by mouth once a day 7)  Fluoxetine Hcl 40 Mg Caps (Fluoxetine hcl) .... Two caps daily 8)  Hydrochlorothiazide 25 Mg Tabs (Hydrochlorothiazide) .... One by mouth daily 9)  Lisinopril 10 Mg Tabs (Lisinopril) .... Take 1 tablet by mouth once a day 10)  Norethindrone Acetate 5 Mg Tabs (Norethindrone acetate) .... Take 1 tablet by mouth once a day 11)  Pepcid 20 Mg Tabs (Famotidine) .... One tab daily 12)  Simvastatin 20 Mg Tabs (Simvastatin) .... Take 1 tablet by mouth every night 13)  Trazodone Hcl 100 Mg Tabs (Trazodone hcl) .... Take 1 tablet by mouth at bedtime 14)  Ibuprofen 800 Mg Tabs (Ibuprofen) .... One three times a day as needed for pain 15)  Geodon 40 Mg Caps (Ziprasidone hcl) .... One by mouth with supper as needed 16)  Tessalon Perles 100 Mg Caps (Benzonatate) .... Two tabs by mouth three times a day 17)  Nitrofurantoin Macrocrystal 100 Mg Caps (Nitrofurantoin macrocrystal) .Marland Kitchen.. 1 by mouth two times a day x 7 days  Patient Instructions: 1)  Take antibiotic nitrofurantoin twice daily for UTI. 2)  We will culture your urine to make sure this antibiotic will work. 3)  If you have fevers, vomiting, or worsening pain, come back to see [place]. Prescriptions:  NITROFURANTOIN MACROCRYSTAL 100 MG CAPS (NITROFURANTOIN MACROCRYSTAL) 1 by mouth two times a day x 7 days  #14 x 0   Entered and Authorized by:   Levander Campion MD   Signed by:   Levander Campion MD on 10/12/2008   Method used:   Electronically to        [facility] [address] [phone]* (retail)       [address]       [place], [place]  [address]       Ph: [phone] or [phone]       Fax: [phone]   RxID:   [id number] NITROFURANTOIN MACROCRYSTAL 100 MG CAPS  (NITROFURANTOIN MACROCRYSTAL) 1 by mouth two times a day x 7 days  #14 x 0   Entered and Authorized by:   Levander Campion MD   Signed by:   Levander Campion MD on 10/12/2008   Method used:   Electronically to        [facility] (retail)       [address]       [facility]       [place], [place]  [address]       Ph: [phone]       Fax: [phone]   RxID:   [id number]   Laboratory Results   Urine Tests  Date/Time Received: Oct 12, 2008 11:15 AM  Date/Time Reported: Oct 12, 2008 11:31 AM   Routine Urinalysis   Color: yellow Appearance: Clear Glucose: negative   (Normal Range: Negative) Bilirubin: negative   (Normal Range: Negative) Ketone: negative   (Normal Range: Negative) Spec. Gravity: 1.025   (Normal Range: 1.003-1.035) Blood: trace-intact   (Normal Range: Negative) pH: 6.0   (Normal Range: 5.0-8.0) Protein: negative   (Normal Range: Negative) Urobilinogen: 0.2   (Normal Range: 0-1) Nitrite: negative   (Normal Range: Negative) Leukocyte Esterace: negative   (Normal Range: Negative)  Urine Microscopic WBC/HPF: rare RBC/HPF: rare Bacteria/HPF: 2+ Mucous/HPF: 1+ Epithelial/HPF: 10-20    Comments: ...............test performed by......Marland KitchenBonnie A. [place], [place] (ASCP)

## 2010-07-05 NOTE — Progress Notes (Signed)
Summary: triage  Phone Note Call from Patient Call back at Home Phone [phone]   Reason for Call: Talk to Nurse Summary of Call: pt is requesting to speak with rn, she received flu shot yesterday and now its red around it and itchy Initial call taken by: ERIN LEVAN,  March 22, 2007 11:13 AM  Follow-up for Phone Call        message left to return call. Follow-up by: Theresia Lo RN,  March 22, 2007 11:20 AM  Additional Follow-up for Phone Call Additional follow up Details #1::        pt received flu vaccine 03/18/07 and now area at injection site is swollen ,red and warm to touch. area is size of quarter.  she does not have a fever. consulted with Dr. Sheffield Slider.  may apply ice pack, tylenol for discomfort. call back if worsening or go to urgent care over the weekend if worsening. Additional Follow-up by: Theresia Lo RN,  March 22, 2007 12:25 PM

## 2010-07-05 NOTE — Assessment & Plan Note (Signed)
Summary: fingers r hand swollen & painful   Vital Signs:  Patient Profile:   50 Years Old Female Weight:      346.1 pounds Temp:     97.8 degrees F Pulse rate:   80 / minute BP sitting:   132 / 82  (left arm)  Pt. in pain?   yes    Location:   finers of right hand    Intensity:   9  Vitals Entered By: Theresia Lo RN (Nov 01, 2007 8:50 AM)              Is Patient Diabetic? No     PCP:  Doralee Albino MD  Chief Complaint:  pain and swelling in 3 fingers of right hand.  History of Present Illness: 50 y/o WF with h/o Carpal tunnel syndrome presents with 2-3 week history of worsening pain in right hand 1st three digits similar to what she had in teh past when treated by Dr Mina Marble.  He performed carpal tunnel release at that time.  There was "evidence of nerve damage" per the patient on nerve conduction studies.  No wasting.  Grip subjectively decreased and associated with pain.  Chronic issues - It has been > 3 months since f/up by Dr Leveda Anna, so she is due for f/up.  Pt was advised of this.    Current Allergies: AMOXICILLIN (AMOXICILLIN) DICLOFENAC SODIUM (DICLOFENAC SODIUM) IODINE (IODINE) PRILOSEC (OMEPRAZOLE) PROTONIX (PANTOPRAZOLE SODIUM)      Physical Exam  General:     Well-developed,well-nourished,in no acute distress; alert,appropriate and cooperative throughout examination Msk:     Right hand with positive tinel's and phalen's.  Grip 4+/5 compared to left hand.  No wasting.      Impression & Recommendations:  Problem # 1:  CARPAL TUNNEL SYNDROME (ICD-354.0) Assessment: Deteriorated We will refer back to orthopedics.  It seems that the carpal tunnel is acting up again.  It may be secondary to permanent damage from before the operation, but we will let them evaluate and treat.  Orders: Orthopedic Surgeon Referral (Ortho Surgeon) [facility]- Est Level  3 [phone])   Complete Medication List: 1)  Advair Diskus 500-50 Mcg/dose Misc (Fluticasone-salmeterol)  .... One puff twice daily 2)  Albuterol 90 Mcg/act Aers (Albuterol) .... Inhale 2 puff using inhaler every four hours 3)  Amitriptyline Hcl 25 Mg Tabs (Amitriptyline hcl) .... One tab three times daily 4)  Clonazepam 1 Mg Tabs (Clonazepam) .... Take 1 tablet by mouth twice a day 5)  Cyclobenzaprine Hcl 10 Mg Tabs (Cyclobenzaprine hcl) .... Take 1 tablet by mouth every night as needed for back/neck pain and spasm 6)  Fluconazole 100 Mg Tabs (Fluconazole) .Marland Kitchen.. 1 tablet by mouth once a day 7)  Fluoxetine Hcl 40 Mg Caps (Fluoxetine hcl) .... Two caps daily 8)  Hydrochlorothiazide 25 Mg Tabs (Hydrochlorothiazide) .... One by mouth daily 9)  Hydrocortisone 2.5 % Oint (Hydrocortisone) .... Apply a small amount to skin every night 10)  Lisinopril 10 Mg Tabs (Lisinopril) .... Take 1 tablet by mouth once a day 11)  Norethindrone Acetate 5 Mg Tabs (Norethindrone acetate) .... Take 1 tablet by mouth once a day 12)  Pepcid 20 Mg Tabs (Famotidine) .... One tab daily 13)  Simvastatin 20 Mg Tabs (Simvastatin) .... Take 1 tablet by mouth every night 14)  Trazodone Hcl 100 Mg Tabs (Trazodone hcl) .... Take 1 tablet by mouth at bedtime 15)  Ibuprofen 800 Mg Tabs (Ibuprofen) .... One three times a day as needed for pain    ]

## 2010-07-05 NOTE — Assessment & Plan Note (Signed)
Summary: CPE,df   Vital Signs:  Patient profile:   50 year old female Height:      64.5 inches Weight:      363.7 pounds Temp:     98.6 degrees F oral Pulse rate:   88 / minute BP sitting:   166 / 112  Vitals Entered By: Alphia Kava (Oct 30, 2008 3:18 PM) CC: CPE no pap Is Patient Diabetic? No   Primary Care Provider:  Doralee Albino MD  CC:  CPE no pap.  History of Present Illness: Here for check up of multiple problems  Pap smears done elsewhere  Has respiratory issues with asthma  Has multiple musculoskeletal pains Knees, arms, neck and low back and shoulders.  Still with indigestion  Weight graph reviewed.    Habits & Providers  Alcohol-Tobacco-Diet     Tobacco Status: never  Current Medications (verified): 1)  Advair Diskus 500-50 Mcg/dose  Misc (Fluticasone-Salmeterol) .... One Puff Twice Daily 2)  Proventil Hfa 108 (90 Base) Mcg/act Aers (Albuterol Sulfate) .... 2 Puffs Q4h As Needed Wheezing 3)  Amitriptyline Hcl 25 Mg Tabs (Amitriptyline Hcl) .... One Tab Three Times Daily 4)  Clonazepam 1 Mg Tabs (Clonazepam) .... Take 1 Tablet By Mouth Twice A Day 5)  Cyclobenzaprine Hcl 10 Mg Tabs (Cyclobenzaprine Hcl) .... Take 1 Tablet By Mouth Every Night As Needed For Back/neck Pain and Spasm 6)  Fluconazole 100 Mg Tabs (Fluconazole) .Marland Kitchen.. 1 Tablet By Mouth Once A Day 7)  Fluoxetine Hcl 40 Mg  Caps (Fluoxetine Hcl) .... Two Caps Daily 8)  Hydrochlorothiazide 25 Mg Tabs (Hydrochlorothiazide) .... One By Mouth Daily 9)  Lisinopril 20 Mg Tabs (Lisinopril) .Marland Kitchen.. 1 Tablet By Mouth Daily 10)  Norethindrone Acetate 5 Mg Tabs (Norethindrone Acetate) .... Take 1 Tablet By Mouth Once A Day 11)  Pepcid 20 Mg Tabs (Famotidine) .... One Tab Daily 12)  Simvastatin 20 Mg Tabs (Simvastatin) .... Take 1 Tablet By Mouth Every Night 13)  Trazodone Hcl 100 Mg Tabs (Trazodone Hcl) .... Take 1 Tablet By Mouth At Bedtime 14)  Ibuprofen 800 Mg  Tabs (Ibuprofen) .... One Three Times A Day As  Needed For Pain  Allergies (verified): 1)  ! Etodolac (Etodolac) 2)  Amoxicillin (Amoxicillin) 3)  Diclofenac Sodium (Diclofenac Sodium) 4)  Iodine (Iodine) 5)  Prilosec (Omeprazole) 6)  Protonix (Pantoprazole Sodium)  Social History: Now permenantly disabled secondary to depression; Has an old Zenaida Niece for transportation; non smoker, non drinker; lives with children age 61,4, and 20 (in 2004) father of children has left the family   Trying to eat healthier Exercise only walking with errands.     Physical Exam  General:  Morbidly obese Head:  Normocephalic and atraumatic without obvious abnormalities. No apparent alopecia or balding. Eyes:  No corneal or conjunctival inflammation noted. EOMI. Perrla. Funduscopic exam benign, without hemorrhages, exudates or papilledema. Vision grossly normal. Mouth:  Oral mucosa and oropharynx without lesions or exudates.  Teeth in good repair. Neck:  No deformities, masses, or tenderness noted. Lungs:  Normal respiratory effort, chest expands symmetrically. Lungs are clear to auscultation, no crackles or wheezes. Heart:  Normal rate and regular rhythm. S1 and S2 normal without gallop, murmur, click, rub or other extra sounds. Abdomen:  Bowel sounds positive,abdomen soft and non-tender without masses, organomegaly or hernias noted. Msk:  No obvious joint deformaties or effusions Extremities:  1+ edema bilaterally   Impression & Recommendations:  Problem # 1:  Preventive Health Care (ICD-V70.0)  Complete PE in person  with major problem of morbid obesity.  Musculoskeletal complaints, breathing difficulties and hypertension all worsened by obesity.  Orders: [facility] - Est  40-64 yrs ([address])  Problem # 2:  OBESITY, NOS (ICD-278.00) Assessment: Deteriorated  Worse.  Referred for consideration of bariatric surgery  Orders: [facility] - Est  40-64 yrs ([address])  Problem # 3:  HYPERTENSION, BENIGN SYSTEMIC (ICD-401.1) Assessment: Deteriorated Increased  lisinopril Her updated medication list for this problem includes:    Hydrochlorothiazide 25 Mg Tabs (Hydrochlorothiazide) ..... One by mouth daily    Lisinopril 20 Mg Tabs (Lisinopril) .Marland Kitchen... 1 tablet by mouth daily  Orders: TSH-[facility] ([address]11-91478) [facility] - Est  40-64 yrs ([address]562)  Problem # 4:  HYPERCHOLESTEROLEMIA (ICD-272.0) Check labs Her updated medication list for this problem includes:    Simvastatin 20 Mg Tabs (Simvastatin) .Marland Kitchen... Take 1 tablet by mouth every night  Orders: Lipid-[facility] (13086-57846) Comp Met-[facility] (96295-28413) [facility] - Est  40-64 yrs ([address]01)  Complete Medication List: 1)  Advair Diskus 500-50 Mcg/dose Misc (Fluticasone-salmeterol) .... One puff twice daily 2)  Proventil Hfa 108 (90 Base) Mcg/act Aers (Albuterol sulfate) .... 2 puffs q4h as needed wheezing 3)  Amitriptyline Hcl 25 Mg Tabs (Amitriptyline hcl) .... One tab three times daily 4)  Clonazepam 1 Mg Tabs (Clonazepam) .... Take 1 tablet by mouth twice a day 5)  Cyclobenzaprine Hcl 10 Mg Tabs (Cyclobenzaprine hcl) .... Take 1 tablet by mouth every night as needed for back/neck pain and spasm 6)  Fluconazole 100 Mg Tabs (Fluconazole) .Marland Kitchen.. 1 tablet by mouth once a day 7)  Fluoxetine Hcl 40 Mg Caps (Fluoxetine hcl) .... Two caps daily 8)  Hydrochlorothiazide 25 Mg Tabs (Hydrochlorothiazide) .... One by mouth daily 9)  Lisinopril 20 Mg Tabs (Lisinopril) .Marland Kitchen.. 1 tablet by mouth daily 10)  Norethindrone Acetate 5 Mg Tabs (Norethindrone acetate) .... Take 1 tablet by mouth once a day 11)  Pepcid 20 Mg Tabs (Famotidine) .... One tab daily 12)  Simvastatin 20 Mg Tabs (Simvastatin) .... Take 1 tablet by mouth every night 13)  Trazodone Hcl 100 Mg Tabs (Trazodone hcl) .... Take 1 tablet by mouth at bedtime 14)  Ibuprofen 800 Mg Tabs (Ibuprofen) .... One three times a day as needed for pain  Patient Instructions: 1)  Referral to consider obesity surgery 2)  Need diet and exercise to improve. 3)  Please schedule a follow-up  appointment in 1 month to recheck your blood pressure.  Prescriptions: LISINOPRIL 20 MG TABS (LISINOPRIL) 1 tablet by mouth daily  #90 x 3   Entered and Authorized by:   Doralee Albino MD   Signed by:   Doralee Albino MD on 10/30/2008   Method used:   Electronically to        [facility]. [address] [phone]* (retail)       [address]       [place], [place]  [address]       Ph: [phone] or [phone]       Fax: [phone]   RxID:   [id number]   Last Flu Vaccine:  Fluvax Non-MCR (03/05/2008 2:25:28 PM) Flu Vaccine Result Date:  03/05/2008 Flu Vaccine Result:  given Last HDL:  40 (04/29/2007 8:53:00 PM) HDL Next Due:  1 yr Last LDL:  122 (04/29/2007 8:53:00 PM) LDL Next Due:  1 yr

## 2010-07-05 NOTE — Miscellaneous (Signed)
Summary: medicaid med reconcilliation  Clinical Lists Changes forms from pharmacy asking for info since pt has more than 11 meds. to pcp chart box.Golden Circle RN  March 31, 2009 9:34 AM  Forms reviewed and signed.  No changes recommended by pharm.  They did emphasize compliance to patient.  Doralee Albino MD  April 05, 2009 3:02 PM
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
## 2010-07-05 NOTE — Miscellaneous (Signed)
Summary: sleep study  Clinical Lists Changes left message for pt to call back. need to tell her of sleep study appt 03/19/09 at 8pm. she can call [phone] for more info if needed.Golden Circle RN  March 16, 2009 11:06 AM  she states it is in [facility].. I called & it is 04/19/09 at 8pm. the form that was faxed here had 03/19/09. Marland KitchenGolden Circle RN  March 16, 2009 3:27 PM

## 2010-07-07 NOTE — Assessment & Plan Note (Signed)
Summary: cough/eo   Vital Signs:  Patient profile:   50 year old female Height:      64.5 inches Weight:      396 pounds BMI:     67.17 O2 Sat:      Kelsey % on Room air Pulse rate:   97 / minute BP sitting:   144 / 84  (right arm)  Vitals Entered By: Arlyss Repress CMA, (June 30, 2010 1:58 PM)  O2 Flow:  Room air CC: cough. productive...x 1 mos Is Patient Diabetic? No Pain Assessment Patient in pain? no        Primary Care Provider:  Doralee Albino MD  CC:  cough. productive...x 1 mos.  History of Present Illness: 50 yo Bullock:  1. Cough: x 1 month, PMHx asthma plus restrictive lung disease 2/2 obesity, using Albuterol and QVAR q hs. Associated with runny nose, congestion, wheeze, SOB. O2 sat Kelsey %. Nonsmoker, but exposed to second-hand smoke during childhood. Denies HA, dizziness, ear pain, sore throat, CP, N/V, changes in BM, LE edema, rash. Patient states that she used to be on Advair and felt that it controlled her symptoms better.  Habits & Providers  Alcohol-Tobacco-Diet     Tobacco Status: never  Current Medications (verified): 1)  Advair Diskus 250-50 Mcg/dose Misc (Fluticasone-Salmeterol) .Marland Kitchen.. 1 Puff 2 Times Daily 2)  Proventil Hfa 108 (90 Base) Mcg/act Aers (Albuterol Sulfate) .... 2 Puffs Q4h As Needed Wheezing 3)  Amitriptyline Hcl 25 Mg Tabs (Amitriptyline Hcl) .... One Tab Three Times Daily 4)  Clonazepam 1 Mg Tabs (Clonazepam) .... Take 1 Tablet By Mouth Twice A Day 5)  Cyclobenzaprine Hcl 10 Mg Tabs (Cyclobenzaprine Hcl) .... Take 1 Tablet By Mouth Every Night As Needed For Back/neck Pain and Spasm 6)  Hydrochlorothiazide 25 Mg Tabs (Hydrochlorothiazide) .... One By Mouth Daily 7)  Norethindrone Acetate 5 Mg Tabs (Norethindrone Acetate) .... Take 1 Tablet By Mouth Once A Day 8)  Pepcid 20 Mg Tabs (Famotidine) .... One Tab Daily 9)  Simvastatin 80 Mg Tabs (Simvastatin) .Marland Kitchen.. 1 By Mouth At Bedtime 10)  Trazodone Hcl 100 Mg Tabs (Trazodone Hcl) .... Take 1 Tablet  By Mouth At Bedtime 11)  Ibuprofen 800 Mg  Tabs (Ibuprofen) .... One Three Times A Day As Needed For Pain 12)  Lisinopril 40 Mg Tabs (Lisinopril) .... One By Mouth Daily 13)  Triamcinolone Acetonide 0.1 % Oint (Triamcinolone Acetonide) .... Apply To Affected Area Two Times A Day Disp 30 Gram Tube 14)  Fluticasone Propionate 50 Mcg/act Susp (Fluticasone Propionate) .... 2 Puffs Each Nostril Daily.  Use Even When Nose Not Stuffy. 15)  Bupropion Hcl 100 Mg Xr12h-Tab (Bupropion Hcl) .... One By Mouth Qam Per Mental Health 16)  Mirtazapine 30 Mg Tabs (Mirtazapine) .... One By Mouth Daily Per Mental Health 17)  Prednisone 20 Mg Tabs (Prednisone) .... 3 Tabs By Mouth Daily X 5 Days  Allergies (verified): 1)  ! Etodolac (Etodolac) 2)  Amoxicillin (Amoxicillin) 3)  Diclofenac Sodium (Diclofenac Sodium) 4)  Iodine (Iodine) 5)  Prilosec (Omeprazole) 6)  Protonix (Pantoprazole Sodium) PMH-FH-SH reviewed for relevance  Review of Systems      See HPI  Physical Exam  General:  Well-developed,well-nourished,in no acute distress; alert,appropriate and cooperative throughout examination. Vitals reviewed. Obese. Eyes:  No injection. Ears:  R ear normal and L ear normal.   Nose:  Mucosal erythema and mucosal edema.   Mouth:  MMM. Neck:  No deformities, masses, or tenderness noted. Lungs:  Normal respiratory  effort, chest expands symmetrically. Moderate expiratory wheeze (with effort). No ronchi.  Heart:  Normal rate and regular rhythm. S1 and S2 normal without gallop, murmur, click, rub or other extra sounds. Pulses:  2+ DP. Extremities:  Trace edema.   Impression & Recommendations:  Problem # 1:  COUGH (ICD-786.2) Assessment Deteriorated  No red flags. Will treat as asthma exacerbation, likely triggered by viral URI. I am not sure of her baseline, however. No signs of luid overload. Will change QVAR to Advair. Prednisone burst. Discussed proper use of albuterol and Advair. Red flags given for  seeking medical care.  Orders: [facility]- Est Level  3 ([address])  Problem # 2:  ASTHMA, PERSISTENT, MODERATE (ICD-493.90) Assessment: Unchanged  Her updated medication list for this problem includes:    Advair Diskus 250-50 Mcg/dose Misc (Fluticasone-salmeterol) .Marland Kitchen... 1 puff 2 times daily    Proventil Hfa 108 (90 Base) Mcg/act Aers (Albuterol sulfate) .Marland Kitchen... 2 puffs q4h as needed wheezing    Prednisone 20 Mg Tabs (Prednisone) .Marland KitchenMarland KitchenMarland KitchenMarland Kitchen 3 tabs by mouth daily x 5 days  Orders: [facility]- Est Level  3 ([address]454)  Complete Medication List: 1)  Advair Diskus 250-50 Mcg/dose Misc (Fluticasone-salmeterol) .Marland Kitchen.. 1 puff 2 times daily 2)  Proventil Hfa 108 (90 Base) Mcg/act Aers (Albuterol sulfate) .... 2 puffs q4h as needed wheezing 3)  Amitriptyline Hcl 25 Mg Tabs (Amitriptyline hcl) .... One tab three times daily 4)  Clonazepam 1 Mg Tabs (Clonazepam) .... Take 1 tablet by mouth twice a day 5)  Cyclobenzaprine Hcl 10 Mg Tabs (Cyclobenzaprine hcl) .... Take 1 tablet by mouth every night as needed for back/neck pain and spasm 6)  Hydrochlorothiazide 25 Mg Tabs (Hydrochlorothiazide) .... One by mouth daily 7)  Norethindrone Acetate 5 Mg Tabs (Norethindrone acetate) .... Take 1 tablet by mouth once a day 8)  Pepcid 20 Mg Tabs (Famotidine) .... One tab daily 9)  Simvastatin 80 Mg Tabs (Simvastatin) .Marland Kitchen.. 1 by mouth at bedtime 10)  Trazodone Hcl 100 Mg Tabs (Trazodone hcl) .... Take 1 tablet by mouth at bedtime 11)  Ibuprofen 800 Mg Tabs (Ibuprofen) .... One three times a day as needed for pain 12)  Lisinopril 40 Mg Tabs (Lisinopril) .... One by mouth daily 13)  Triamcinolone Acetonide 0.1 % Oint (Triamcinolone acetonide) .... Apply to affected area two times a day disp 30 gram tube 14)  Fluticasone Propionate 50 Mcg/act Susp (Fluticasone propionate) .... 2 puffs each nostril daily.  use even when nose not stuffy. 15)  Bupropion Hcl 100 Mg Xr12h-tab (Bupropion hcl) .... One by mouth qam per mental health 16)  Mirtazapine  30 Mg Tabs (Mirtazapine) .... One by mouth daily per mental health 17)  Prednisone 20 Mg Tabs (Prednisone) .... 3 tabs by mouth daily x 5 days  Patient Instructions: 1)  It was nice to meet you today. Prescriptions: PREDNISONE 20 MG TABS (PREDNISONE) 3 tabs by mouth daily x 5 days  #15 x 0   Entered and Authorized by:   Helane Rima DO   Signed by:   Helane Rima DO on 06/30/2010   Method used:   Print then Give to Patient   RxID:   [id number] ADVAIR DISKUS 250-50 MCG/DOSE MISC (FLUTICASONE-SALMETEROL) 1 puff 2 times daily  #1 x 1   Entered and Authorized by:   Helane Rima DO   Signed by:   Helane Rima DO on 06/30/2010   Method used:   Print then Give to Patient   RxID:   [id number]  Orders Added: 1)  [facility]- Est Level  3 [14782]

## 2010-07-13 ENCOUNTER — Other Ambulatory Visit: Payer: Self-pay | Admitting: Family Medicine

## 2010-07-14 ENCOUNTER — Encounter: Payer: Self-pay | Admitting: Family Medicine

## 2010-07-14 ENCOUNTER — Ambulatory Visit (INDEPENDENT_AMBULATORY_CARE_PROVIDER_SITE_OTHER): Payer: Medicaid Other | Admitting: Family Medicine

## 2010-07-14 NOTE — Patient Instructions (Signed)
Eat 3 meals and 1-2 snacks per day.   More home preparation will allow you to control your fat, sodium, and calorie level.  Sources of fat include red meat (eat the leanest meat you can find, and limit portion size to 3-4 oz, the size of a deck of cards).   Red meat contains ~5-8 grams of fat per ounce!  Other sources of fat include many condiments:  Butter, margarine, mayo, salad dressing.  Choose low-fat versions whenever possible.   Limit fat intake to 45 grams per day.     At both lunch & dinner, get twice as many veg's protein and starch foods.   Stock up on frozen veg's, and fresh veg's that will go in a lunch bag well.   Be mindful of your food choices.   The main problem with [place] restaurants is that it's hard to get veg's, so if you eat [place] from a restaurant, get takeout, and add a veg at home, i.e., carrots or other raw veg's or microwaved frozen veg's.   Advance planning is the key to making this work for you.  Each day you'll benefit from planning when, where, and what of meals.   Exercise opportunities:  Park further away; never lie down when you can sit; never sit when you can stand; never stand when you can pace.

## 2010-07-14 NOTE — Progress Notes (Addendum)
Medical Nutrition Therapy:  Appt start time: 0900 end time:  1000.  Assessment:  Primary concerns today: Weight management and meal planning and choosing restaurant foods; how to eat on the go.  Kelsey Bullock is a single mom of 3 kids, and she said that a major obstacle to better food choices is that she spends most of her day driving to various activities, and doesn't have time to eat.  Most meals are takeout or restaurant food.  She likes a wide variety of foods, and especially red meat.  She said she will sometimes eat a 20-oz steak.  Ms. Tweten also expressed concern for her obese 12-YO son, who eats secretly, and excessively.  Her 14-YO daughter is also overweight (~170 l).  Estimated kcal intake yesterday:  900 @ bkfst, 640 @ lunch, 840 @ dinner; total of ~2500 kcal.    Usual eating pattern includes Meal 1 and 0+ snacks per day.     Avoided foods include: none avoided, but she does eat a lot of red meat, i.e., at least once a day.   Up at 5:30 AM; bkfst @ 9:30 [employer]:  3-egg western omelette w/ chs, ham, bacon, 3 pancakes, 1 tbsp butter, 2 tbsp syrup, water; lunch @ 1 PM:  ~ 3 c Stew beef, veg's, & rice, 48 oz swt tea (~1/2 tbsp sugar per 8 oz); dinner @ 7 PM:  Bean dip w/ hamburger & chs on flour tortillas, 1 c ice cream, water; had ~7  peppermints all day (for cough).  To bed ~11 PM; wakes frequently during the night coughing.       Usual physical activity includes no regular exercise.   Progress Towards Goal(s):  In progress   Nutritional Diagnosis:  NB-2.1 Physical inactivity As related to time constraints & knee pain.  As evidenced by no regular exercise. NI-1.5 Excessive energy intake As related to expenditure.  As evidenced by high BMI.    Intervention:  Provided nutrition education and counseling.  Discussed the need for greater attn to food planning, and how this will help her medical conditions and will set a good example for her children.    Monitoring/Evaluation:  Dietary intake  in 4 week(s).

## 2010-07-14 NOTE — Telephone Encounter (Signed)
Please review and refill

## 2010-07-26 ENCOUNTER — Encounter: Payer: Self-pay | Admitting: Family Medicine

## 2010-07-26 ENCOUNTER — Telehealth: Payer: Self-pay | Admitting: Family Medicine

## 2010-07-26 ENCOUNTER — Ambulatory Visit (INDEPENDENT_AMBULATORY_CARE_PROVIDER_SITE_OTHER): Payer: Medicaid Other | Admitting: Family Medicine

## 2010-07-26 ENCOUNTER — Ambulatory Visit
Admission: RE | Admit: 2010-07-26 | Discharge: 2010-07-26 | Disposition: A | Discharge status: Home / Self Care | Payer: Medicaid Other | Source: Ambulatory Visit | Attending: Family Medicine | Admitting: Family Medicine

## 2010-07-26 VITALS — BP 140/90 | Temp 98.5°F | Wt 397.6 lb

## 2010-07-26 DIAGNOSIS — R05 Cough: Secondary | ICD-10-CM

## 2010-07-26 DIAGNOSIS — R059 Cough, unspecified: Secondary | ICD-10-CM

## 2010-07-26 DIAGNOSIS — J45909 Unspecified asthma, uncomplicated: Secondary | ICD-10-CM

## 2010-07-26 MED ORDER — GUAIFENESIN-CODEINE 100-10 MG/5ML PO SYRP: 5.0000 mL | ORAL_SOLUTION | Freq: Two times a day (BID) | ORAL | Status: DC | PRN

## 2010-07-26 MED ORDER — ALBUTEROL SULFATE HFA 108 (90 BASE) MCG/ACT IN AERS: 2.0000 | INHALATION_SPRAY | RESPIRATORY_TRACT | Status: DC | PRN

## 2010-07-26 MED ORDER — FLUTICASONE-SALMETEROL 500-50 MCG/DOSE IN AEPB: 1.0000 | INHALATION_SPRAY | Freq: Every evening | RESPIRATORY_TRACT | Status: DC

## 2010-07-26 MED ORDER — ALBUTEROL SULFATE (2.5 MG/3ML) 0.083% IN NEBU: 2.5000 mg | INHALATION_SOLUTION | RESPIRATORY_TRACT | Status: DC | PRN

## 2010-07-26 MED ORDER — ALBUTEROL SULFATE (2.5 MG/3ML) 0.083% IN NEBU: 2.5000 mg | INHALATION_SOLUTION | Freq: Once | RESPIRATORY_TRACT | Status: DC

## 2010-07-26 MED ORDER — IPRATROPIUM BROMIDE 0.02 % IN SOLN: 0.5000 mg | Freq: Once | RESPIRATORY_TRACT | Status: DC

## 2010-07-26 NOTE — Telephone Encounter (Signed)
Faxed..Nitisha Civello Kristin  

## 2010-07-26 NOTE — Assessment & Plan Note (Addendum)
Pt is having worsening coughing and is losing urine each time she coughs. She is having some rib pain when she coughs as well. Plan to treat with Robitussin AC. Will also plan to get CXR to assess her lungs for signs of infection on top of the asthma.

## 2010-07-26 NOTE — Progress Notes (Signed)
  Subjective:    Patient ID: Kelsey Bullock, female    DOB: 1960-10-04, 50 y.o.   MRN: [id number]  HPI  Pt comes in with heavy breathing and wheezing. She says she was much worse last night. She has been having couging, congestion and wheezing for the last 3 days and it is not getting better. She had some diarrhea 2 days ago but it doing well now. She has a h/o asthma and has switched back from Qvar to Advair and is taking the 500-500 mg inhaler once every night. She is only using the albuterol once at night as well. She has been using some mints for the cough but other than that she has not tried anything over the counter. No complaint of fevers or chills, stools have returned to normal, she is losing some urine with coughing but has a h/o on incontinence.    Review of Systems Neg except as noted in the HPI.     Objective:   Physical Exam  Constitutional:       Pt appears to not feel well, she is audibley wheezing.   HENT:  Head: Normocephalic and atraumatic.  Right Ear: External ear normal.  Left Ear: External ear normal.       Come nasal congestion and drainage  Cardiovascular: Normal rate and normal heart sounds.   Pulmonary/Chest: She has wheezes.       Pt is not in distress but she is breathing faster than normal and is audibly wheezing.   Abdominal: Soft.       Hypoactive bowel sounds          Assessment & Plan:

## 2010-07-26 NOTE — Assessment & Plan Note (Signed)
Pt appears to be having a viral infection induced asthma exacerbation. She received 1 neb treatment in the office but still had quite a bit of wheezing. She was given an Rx for Albuterol nebs, a neb machine (will call it into [facility] on [address]) and refill on the Advair she was using at home. Pt is instructed to use the machine nebs every 4 hours as needed for wheezing and shortness of breath and verbally told to go to the ED if her breathing worsens tonight. Pt agrees. Will await CXR to see if there is something else (infectious) going on.

## 2010-07-26 NOTE — Telephone Encounter (Signed)
For her nebulizer - pls fax to [phone] Needs to be on a script

## 2010-07-26 NOTE — Patient Instructions (Signed)
You have Advair and albuterol solution to pick up.  You also have robitussin with codeine to pick up too.  Pick up a nebulizer machine today.  Get a chest xray today.

## 2010-07-27 ENCOUNTER — Telehealth: Payer: Self-pay | Admitting: Family Medicine

## 2010-07-27 NOTE — Telephone Encounter (Signed)
Pt asking for referral to see asthma specialist, still having problems, seen yesterday.

## 2010-07-28 NOTE — Telephone Encounter (Signed)
Called and left message that I would like to see before referral.  She should make an appointment.

## 2010-07-30 ENCOUNTER — Inpatient Hospital Stay (INDEPENDENT_AMBULATORY_CARE_PROVIDER_SITE_OTHER)
Admission: RE | Admit: 2010-07-30 | Discharge: 2010-07-30 | Disposition: A | Discharge status: Home / Self Care | Payer: Medicaid Other | Source: Ambulatory Visit

## 2010-07-30 DIAGNOSIS — B370 Candidal stomatitis: Secondary | ICD-10-CM

## 2010-07-30 DIAGNOSIS — J45909 Unspecified asthma, uncomplicated: Secondary | ICD-10-CM

## 2010-08-11 ENCOUNTER — Other Ambulatory Visit: Payer: Self-pay | Admitting: Family Medicine

## 2010-08-11 NOTE — Assessment & Plan Note (Signed)
Summary: np/hensel,df   Primary Care Provider:  Doralee Albino MD   History of Present Illness: Patient encounter for medical nutrition therapy documented in [facility], 07-14-10.  Allergies: 1)  ! Etodolac (Etodolac) 2)  Amoxicillin (Amoxicillin) 3)  Diclofenac Sodium (Diclofenac Sodium) 4)  Iodine (Iodine) 5)  Prilosec (Omeprazole) 6)  Protonix (Pantoprazole Sodium)   Complete Medication List: 1)  Advair Diskus 250-50 Mcg/dose Misc (Fluticasone-salmeterol) .Marland Kitchen.. 1 puff 2 times daily 2)  Proventil Hfa 108 (90 Base) Mcg/act Aers (Albuterol sulfate) .... 2 puffs q4h as needed wheezing 3)  Amitriptyline Hcl 25 Mg Tabs (Amitriptyline hcl) .... One tab three times daily 4)  Clonazepam 1 Mg Tabs (Clonazepam) .... Take 1 tablet by mouth twice a day 5)  Cyclobenzaprine Hcl 10 Mg Tabs (Cyclobenzaprine hcl) .... Take 1 tablet by mouth every night as needed for back/neck pain and spasm 6)  Hydrochlorothiazide 25 Mg Tabs (Hydrochlorothiazide) .... One by mouth daily 7)  Norethindrone Acetate 5 Mg Tabs (Norethindrone acetate) .... Take 1 tablet by mouth once a day 8)  Pepcid 20 Mg Tabs (Famotidine) .... One tab daily 9)  Simvastatin 80 Mg Tabs (Simvastatin) .Marland Kitchen.. 1 by mouth at bedtime 10)  Trazodone Hcl 100 Mg Tabs (Trazodone hcl) .... Take 1 tablet by mouth at bedtime 11)  Ibuprofen 800 Mg Tabs (Ibuprofen) .... One three times a day as needed for pain 12)  Lisinopril 40 Mg Tabs (Lisinopril) .... One by mouth daily 13)  Triamcinolone Acetonide 0.1 % Oint (Triamcinolone acetonide) .... Apply to affected area two times a day disp 30 gram tube 14)  Fluticasone Propionate 50 Mcg/act Susp (Fluticasone propionate) .... 2 puffs each nostril daily.  use even when nose not stuffy. 15)  Bupropion Hcl 100 Mg Xr12h-tab (Bupropion hcl) .... One by mouth qam per mental health 16)  Mirtazapine 30 Mg Tabs (Mirtazapine) .... One by mouth daily per mental health 17)  Prednisone 20 Mg Tabs (Prednisone) .... 3  tabs by mouth daily x 5 days

## 2010-08-11 NOTE — Telephone Encounter (Signed)
Refill request

## 2010-08-17 ENCOUNTER — Encounter: Payer: Self-pay | Admitting: Family Medicine

## 2010-08-17 ENCOUNTER — Ambulatory Visit (INDEPENDENT_AMBULATORY_CARE_PROVIDER_SITE_OTHER): Payer: Medicaid Other | Admitting: Family Medicine

## 2010-08-17 VITALS — BP 146/90 | HR 108 | Temp 99.6°F | Ht 66.0 in | Wt 392.0 lb

## 2010-08-17 DIAGNOSIS — J309 Allergic rhinitis, unspecified: Secondary | ICD-10-CM

## 2010-08-17 DIAGNOSIS — R059 Cough, unspecified: Secondary | ICD-10-CM

## 2010-08-17 DIAGNOSIS — R05 Cough: Secondary | ICD-10-CM

## 2010-08-17 DIAGNOSIS — G4730 Sleep apnea, unspecified: Secondary | ICD-10-CM

## 2010-08-17 DIAGNOSIS — J45909 Unspecified asthma, uncomplicated: Secondary | ICD-10-CM

## 2010-08-17 DIAGNOSIS — E669 Obesity, unspecified: Secondary | ICD-10-CM

## 2010-08-17 NOTE — Progress Notes (Signed)
  Subjective:    Patient ID: Kelsey Bullock, female    DOB: 30-Mar-1961, 50 y.o.   MRN: [id number]  HPI Very congested mostly head congestion and post nasal drip.  At times asthma is quite bad and she is short of breath.  Using nebulizer.  Deep bronchitic cough is a major symptom.  Was at her worst 10 days ago.  Was bad for 2-3 weeks.  Now improving but not well.      Review of Systems     Objective:   Physical Exam HEENT - nasal congestion, otherwise normal Neck- no sig nodes Lungs clear no wheeze       Assessment & Plan:

## 2010-08-17 NOTE — Assessment & Plan Note (Signed)
Diagnosed by sleep study but cannot tolerate mask.

## 2010-08-17 NOTE — Patient Instructions (Signed)
Make sure you take your advair and flonase two times per day -morning and night.  Even when feeling good Only use your albuterol nebulizer when you are wheezing

## 2010-08-19 NOTE — Assessment & Plan Note (Signed)
Worse, stay on nasal steroids

## 2010-08-19 NOTE — Assessment & Plan Note (Signed)
Steadily worse with 60 lb wt gain over past 4 years.  Counciled.  Consider bariatric surg

## 2010-08-19 NOTE — Assessment & Plan Note (Signed)
Seems reasonably well controled with no wheeze

## 2010-08-19 NOTE — Assessment & Plan Note (Signed)
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  Also likely has restrictive lung disease componant

## 2010-08-24 ENCOUNTER — Telehealth: Payer: Self-pay | Admitting: *Deleted

## 2010-08-24 NOTE — Telephone Encounter (Signed)
OK to refer.  I would actually prefer that she wait a couple months to see how she recovers (see last office visit.)  I am OK if she wants to go now.

## 2010-08-24 NOTE — Telephone Encounter (Signed)
Patient dropped off forms to be filled out for Bariatrics.  Please fax when completed.

## 2010-08-24 NOTE — Telephone Encounter (Signed)
Patient needs an appt with you per Dr. Leveda Anna.  Thanks!

## 2010-08-24 NOTE — Telephone Encounter (Signed)
Received a call from [facility].  Thi patient is requesting a referral to them.  Told them I would have to run by her PCP first.

## 2010-08-30 ENCOUNTER — Encounter: Payer: Self-pay | Admitting: Family Medicine

## 2010-09-08 ENCOUNTER — Other Ambulatory Visit: Payer: Self-pay | Admitting: Family Medicine

## 2010-09-08 NOTE — Telephone Encounter (Signed)
Refill request

## 2010-09-26 ENCOUNTER — Encounter: Payer: Self-pay | Admitting: Family Medicine

## 2010-09-26 ENCOUNTER — Ambulatory Visit (INDEPENDENT_AMBULATORY_CARE_PROVIDER_SITE_OTHER): Payer: Medicaid Other | Admitting: Family Medicine

## 2010-09-26 DIAGNOSIS — R05 Cough: Secondary | ICD-10-CM

## 2010-09-26 DIAGNOSIS — J45909 Unspecified asthma, uncomplicated: Secondary | ICD-10-CM

## 2010-09-26 DIAGNOSIS — R059 Cough, unspecified: Secondary | ICD-10-CM

## 2010-09-26 MED ORDER — ALBUTEROL SULFATE (2.5 MG/3ML) 0.083% IN NEBU: 2.5000 mg | INHALATION_SOLUTION | RESPIRATORY_TRACT | Status: DC

## 2010-09-26 MED ORDER — MOXIFLOXACIN HCL 400 MG PO TABS: 400.0000 mg | ORAL_TABLET | Freq: Every day | ORAL | Status: AC

## 2010-09-26 MED ORDER — TRIAMCINOLONE ACETONIDE 0.1 % EX OINT: TOPICAL_OINTMENT | Freq: Three times a day (TID) | CUTANEOUS | Status: DC

## 2010-09-26 MED ORDER — PREDNISONE 20 MG PO TABS: ORAL_TABLET | ORAL | Status: DC

## 2010-09-26 MED ORDER — IPRATROPIUM BROMIDE 0.02 % IN SOLN: 0.5000 mg | RESPIRATORY_TRACT | Status: DC

## 2010-09-26 MED ORDER — BENZONATATE 200 MG PO CAPS: 200.0000 mg | ORAL_CAPSULE | Freq: Three times a day (TID) | ORAL | Status: DC | PRN

## 2010-09-26 MED ORDER — IPRATROPIUM BROMIDE 0.02 % IN SOLN
0.5000 mg | Freq: Once | RESPIRATORY_TRACT | Status: AC
  Administered 2010-09-26: 0.5 mg via RESPIRATORY_TRACT

## 2010-09-26 MED ORDER — ALBUTEROL SULFATE (2.5 MG/3ML) 0.083% IN NEBU
2.5000 mg | INHALATION_SOLUTION | Freq: Once | RESPIRATORY_TRACT | Status: AC
  Administered 2010-09-26: 2.5 mg via RESPIRATORY_TRACT

## 2010-09-26 MED ORDER — GUAIFENESIN-CODEINE 100-10 MG/5ML PO SYRP: 5.0000 mL | ORAL_SOLUTION | Freq: Two times a day (BID) | ORAL | Status: DC | PRN

## 2010-09-26 NOTE — Patient Instructions (Signed)
I have sent in a referral for the asthma doctor.  You have several prescriptions to pick up. Don't get the Tessalon perrles.  Call us if you have problems with thrush.

## 2010-09-26 NOTE — Assessment & Plan Note (Addendum)
Pt comes in today with asthma wheezing.  Even after a breathing treatment she continues to have wheezing, plan to start 5 day prednisone burst, treat with antibiotics for her bronchitis with productive cough and try to decrease the cough with Robitussin AC.  Pt requests to allergy MD that her child sees([facility]). Plan to do referral today as Dr. Leveda Anna (her PCP) had approved in a telephone question 08/24/10.

## 2010-09-26 NOTE — Progress Notes (Signed)
Wheezing: Pt comes in wheezing and has been having difficulty breathing for the last 2 weeks. She [place] around [place] around her the entire time. She also has been exposed to more pollen the last few weeks. She has been using her nebulizer the last 2 nights in order to breathe. Her kids also have a lot of congestion like she does. She has a long standing h/o  Asthma requiring bursts of steroids in the past. Pt does currently have a cough and is not taking anything for the cough.   ROS: Pt has been having chills but no fevers, no changes with urine other than some incontinence with all the coughing, no changes in stools.   PE:  Gen: pt does not appear to feel well.  HEENT: [place]/AT, EOMI, PERRL, TM's not bulging with no erythema in canals bilaterally, no LAD, no post-pharynx erythema.  ZO:[id number] after the nebulizer treatment, no murmurs Pulm: Wheezing heard in anterior and posterior lungs prior to nebulizer,  Less wheezing but still pronounced in posterior lungs bilaterally even after Skin: Pt has some erythema in between her folds on her panus and on her side rolls.   Ext: no edema

## 2010-09-30 ENCOUNTER — Telehealth: Payer: Self-pay | Admitting: Family Medicine

## 2010-09-30 ENCOUNTER — Telehealth: Payer: Self-pay | Admitting: *Deleted

## 2010-09-30 NOTE — Telephone Encounter (Signed)
LM that her appt is 10/25/10 at 9:30. They will be sending her a new pt packet

## 2010-09-30 NOTE — Telephone Encounter (Signed)
Ms. Herzberg is calling about referral with Dr. Sharyn Lull.  She really need to have the appt set up asap.  Call when done

## 2010-09-30 NOTE — Telephone Encounter (Signed)
I made an appt with Dr, Sharyn Lull for 10/25/10 at 9:30am. This is the earliest they had. I LM for pt to call us. Need to tell her to be off all antihistamines for 3 days before the appt, bring ins card, co pay & photo ID

## 2010-11-07 ENCOUNTER — Telehealth: Payer: Self-pay | Admitting: *Deleted

## 2010-11-07 NOTE — Telephone Encounter (Signed)
Received call from [facility] where patient see Dr. Sandria Manly  a psychiatrist. They need a referral updated . Gave our NPI but they need to know how many visits to approve. Need to verify with Dr.  Leveda Anna  .  States patient has been going there since 2008. Call Marylene Land back at [phone].

## 2010-11-08 NOTE — Telephone Encounter (Signed)
Called and told unlimited visits.  Kelsey Bullock is troubled and she is the single parent of three, at risk children.  I can use all the help I can get.

## 2010-12-16 ENCOUNTER — Encounter: Payer: Self-pay | Admitting: Family Medicine

## 2010-12-16 NOTE — Progress Notes (Signed)
  Subjective:    Patient ID: Kelsey Bullock, female    DOB: January 15, 1961, 50 y.o.   MRN: [id number]  HPI Updated meds based on consult note from Dr. Lucie Leather, allergy and asthma     Review of Systems     Objective:   Physical Exam        Assessment & Plan:

## 2010-12-29 ENCOUNTER — Other Ambulatory Visit: Payer: Self-pay | Admitting: Family Medicine

## 2010-12-30 NOTE — Telephone Encounter (Signed)
Refill request

## 2011-01-25 ENCOUNTER — Encounter: Payer: Self-pay | Admitting: Family Medicine

## 2011-01-25 ENCOUNTER — Ambulatory Visit (INDEPENDENT_AMBULATORY_CARE_PROVIDER_SITE_OTHER): Payer: Medicaid Other | Admitting: Family Medicine

## 2011-01-25 VITALS — BP 131/75 | HR 82 | Temp 98.8°F | Ht 65.5 in | Wt 398.9 lb

## 2011-01-25 DIAGNOSIS — R7309 Other abnormal glucose: Secondary | ICD-10-CM

## 2011-01-25 DIAGNOSIS — E669 Obesity, unspecified: Secondary | ICD-10-CM

## 2011-01-25 DIAGNOSIS — R739 Hyperglycemia, unspecified: Secondary | ICD-10-CM

## 2011-01-25 DIAGNOSIS — I10 Essential (primary) hypertension: Secondary | ICD-10-CM

## 2011-01-25 DIAGNOSIS — R7303 Prediabetes: Secondary | ICD-10-CM | POA: Insufficient documentation

## 2011-01-25 DIAGNOSIS — E1140 Type 2 diabetes mellitus with diabetic neuropathy, unspecified: Secondary | ICD-10-CM | POA: Insufficient documentation

## 2011-01-25 LAB — POCT GLYCOSYLATED HEMOGLOBIN (HGB A1C): Hemoglobin A1C: 6.2

## 2011-01-25 NOTE — Assessment & Plan Note (Signed)
Obesity is the central concern for her health. Her BMI is 65. She has limited ability to exercise because of knee pain and asthma. She is pursuing bariatric surgery. Strongly encouraged her to continue this pursued. Expressed the concern that she would have a limited life expectancy at this weight.

## 2011-01-25 NOTE — Assessment & Plan Note (Addendum)
Obese and several high blood sugar readings.  Check for DM. Hemoglobin A1c is in the prediabetic range. We'll continue to monitor. Of course recommend weight loss.

## 2011-01-25 NOTE — Assessment & Plan Note (Signed)
Blood pressure at goal despite poor compliance with diet and exercise. Per patient compliance with medications is good.

## 2011-01-25 NOTE — Progress Notes (Signed)
  Subjective:    Patient ID: Kelsey Bullock, female    DOB: September 25, 1960, 50 y.o.   MRN: [id number]  HPI  Unfortunately, wt is still up.  She is pursuing bariatric surgery. Asthma OK.  Seeing allergist who controls meds Also sees mental health who prescribe meds.  Here for a general checkup. No specific complaints. She gets her Pap smears done elsewhere. She has an appointment for her next mammogram.    Review of Systems     Objective:   Physical Exam HEENT normal  Neck supple without masses  Lungs clear  Cardiac regular rate and rhythm  Abdomen benign  Extremities bilateral 1+ edema.       Assessment & Plan:

## 2011-01-26 LAB — BASIC METABOLIC PANEL WITH GFR
BUN: 11 mg/dL (ref 6–23)
CO2: 25 mEq/L (ref 19–32)
Calcium: 9.5 mg/dL (ref 8.4–10.5)
Chloride: 107 mEq/L (ref 96–112)
Creat: 0.69 mg/dL (ref 0.50–1.10)
GFR, Est African American: >60 mL/min (ref >60)
GFR, Est Non African American: >60 mL/min (ref >60)
Glucose, Bld: 97 mg/dL (ref 70–99)
Potassium: 4.2 mEq/L (ref 3.5–5.3)
Sodium: 138 mEq/L (ref 135–145)

## 2011-01-27 NOTE — Progress Notes (Signed)
Addended by: Jimmy Footman K on: 01/27/2011 11:30 AM   Modules accepted: Orders

## 2011-02-03 ENCOUNTER — Other Ambulatory Visit: Payer: Self-pay | Admitting: Family Medicine

## 2011-02-03 NOTE — Telephone Encounter (Signed)
Refill request for Dr. Hensel's patient. 

## 2011-02-21 ENCOUNTER — Telehealth: Payer: Self-pay | Admitting: Family Medicine

## 2011-02-21 NOTE — Telephone Encounter (Signed)
Ms. Lollar need a letter faxed to [facility], Bariatric Dept stating pat has been trying to lose weight for the past 2 yrs.  Can fax to [place] @ [phone]

## 2011-02-22 ENCOUNTER — Encounter: Payer: Self-pay | Admitting: Family Medicine

## 2011-02-22 NOTE — Telephone Encounter (Signed)
Done, see letter, which was faxed

## 2011-02-27 ENCOUNTER — Ambulatory Visit (INDEPENDENT_AMBULATORY_CARE_PROVIDER_SITE_OTHER): Payer: Medicaid Other | Admitting: Family Medicine

## 2011-02-27 ENCOUNTER — Encounter: Payer: Self-pay | Admitting: Family Medicine

## 2011-02-27 ENCOUNTER — Ambulatory Visit (INDEPENDENT_AMBULATORY_CARE_PROVIDER_SITE_OTHER): Payer: Medicaid Other | Admitting: *Deleted

## 2011-02-27 DIAGNOSIS — Z23 Encounter for immunization: Secondary | ICD-10-CM

## 2011-02-27 DIAGNOSIS — I10 Essential (primary) hypertension: Secondary | ICD-10-CM

## 2011-02-27 DIAGNOSIS — E7800 Pure hypercholesterolemia, unspecified: Secondary | ICD-10-CM

## 2011-02-27 NOTE — Patient Instructions (Addendum)
-   Call Dr. Gerilyn Pilgrim with information regarding bariatric surgery requirements, i.e., how often will you need to see a dietitian and/or your primary care physician, and if you will follow up with the [facility] RD or continue to see Dr. Gerilyn Pilgrim: [phone]. - PLANNING AHEAD WILL BE CRITICAL TO MAKING THE DIETARY CHANGES YOU'LL NEED.   - THINK ABOUT HOW YOU FUEL YOUR BODY:  What's good for you?  VEGETABLES, FISH (not fried), FRUIT, NUTS & SEEDS (in moderation b/c they have a lot of calories), CHICKEN, [place].  If you use beef or pork, choose the leanest you can find, and cook on your [employer].   - Eat at least 3 meals and 1-2 snacks per day.  Aim for no more than 5 hours between eating.  - Sample breakfast:  1/2 roast beef sandwich.    - Sample lunch:  Sandwich, carrots, piece of fruit  - Sample dinner:  Meat (protein source), veg's, starch  - Snacks:  Fruit, yogurt, high-fiber (at least 5 grams of fiber per serving) cereal or granola with fat-free milk or yogurt.   - TASTE PREFERENCES ARE LEARNED.  PREFERENCE FOLLOW PRACTICE.   - Obtain twice as many veg's as protein or carbohydrate foods for both lunch and dinner. - Exercise opportunities:  Move your body whenever you can throughout the day.  Most important is to STAND UP several times an hour.   - Keep a record of what physical activity you do each day.

## 2011-02-27 NOTE — Progress Notes (Signed)
Medical Nutrition Therapy:  Appt start time: 1130 end time:  1230.  Assessment:  Primary concerns today: blood pressure and weight management.  Kelsey Bullock is interested in bariatric surgery, and is waiting to hear back from Dr. Lily Peer at [facility] re surgery.  I anticipate they will require 3-6 months documentation of weight loss efforts working with a physician and registered dietitian.   Usual eating pattern includes 1-2 meals and 0-1 snacks per day.  Everyday foods include ~40 oz sweet tea, hamburger, & bread.  Avoided foods include none.   24-hr recall suggests an intake of ~1400 kcal distributed between 1 meal and 1 snack: B- none; Snk- none; L- none; Snk- none; D (5 PM)- [place]: beef burrito, beef-stuffed pepper, salad, ~2/3 c refried beans, 3 c sweet tea; Snk- 3 fun size Butterfingers.  Yesterday was atypical in that one meal was later than usual, and [name] doesn't usually eat candy.  Usual physical activity includes walking in [facility] 2 X wk (in and out; takes ~10 min).    Progress Towards Goal(s):  In progress.   Nutritional Diagnosis:  NB-1.5 Disordered eating pattern As related to eating not being a priority.  As evidenced by usual intake of 1-2 meals per day. . NB-2.1 Physical inactivity As related to knee pain.  As evidenced by no usual exercise.    Intervention:  Nutrition education.  Monitoring/Evaluation:  Dietary intake, exercise, and body weight in 1 month.

## 2011-03-08 ENCOUNTER — Other Ambulatory Visit: Payer: Self-pay | Admitting: Family Medicine

## 2011-03-09 NOTE — Telephone Encounter (Signed)
Refill request

## 2011-03-22 ENCOUNTER — Encounter: Payer: Self-pay | Admitting: Family Medicine

## 2011-03-22 ENCOUNTER — Telehealth: Payer: Self-pay | Admitting: Family Medicine

## 2011-03-22 ENCOUNTER — Ambulatory Visit: Payer: Medicaid Other | Admitting: Family Medicine

## 2011-03-22 ENCOUNTER — Ambulatory Visit (INDEPENDENT_AMBULATORY_CARE_PROVIDER_SITE_OTHER): Payer: Medicaid Other | Admitting: Family Medicine

## 2011-03-22 ENCOUNTER — Other Ambulatory Visit: Payer: Self-pay | Admitting: Family Medicine

## 2011-03-22 DIAGNOSIS — R7309 Other abnormal glucose: Secondary | ICD-10-CM

## 2011-03-22 DIAGNOSIS — E669 Obesity, unspecified: Secondary | ICD-10-CM

## 2011-03-22 DIAGNOSIS — R739 Hyperglycemia, unspecified: Secondary | ICD-10-CM

## 2011-03-22 NOTE — Progress Notes (Signed)
Medical Nutrition Therapy:  Appt start time: 1030 end time:  1130.  Assessment:  Primary concerns today: Blood sugar control and weight management.  Margene called [facility] again to ask about the requirements for bariatric surgery. The dietitian said she would check and call her back, but Tarra has been told that before with no return call.   Yosselin has been variably successful in eating breakfast.  Obstacles to change include time constraints, as she is usually on the go to appts for herself or for her kids' appts or activities, as well as the daily challenges of single-parenting her children, all of whom have diagnoses of bipolar D/O.  One morning she was up till 2:45 AM refereeing her kids' (13, 15, and 10-YO) fighting.  She is dealing with behavior of her 13-YO son such as urinating on places around the house when he is angry.  (Children are all in therapy currently, and are followed medically.)  24-hr recall suggests intake of ~2400 kcal: (Up @ 5:30 AM); B- none; L (1:30 PM)- [employer] club sandwich (burger, cheese, bacon, let, tom) & Ff's, 40 oz sweet tea; Snack (2 PM)- apple; D (6 PM)- club sandwich, Ff's, 8 oz swt tea.  This was a bit lower intake than usual.  On days she's had breakfast, it has been meat sandwich made w/ a wrap, Cheerios w/ 2% milk.  Reine usually eats at least one takeout or restaurant meal daily.  She feels like she's eaten more fruit and veg's.  She has been walking ~15-20 min 2 X wk, all functional exercise, i.e., [employer] shopping.  We had a lengthy discussion today re. balancing Tayanna's taste preferences (i.e., reluctance to make a sandwich the night before b/c it will be soggy) with her desired weight loss goals.  Tyjah committed to the three goals determined today, but on a scale of 1-10, scored her confidence at only a 3 for being able to accomplish all three goals.    Progress Towards Goal(s):  In progress.   Nutritional Diagnosis:  NB-1.5 Disordered  eating pattern As related to eating not being a priority.  As evidenced by usual intake of 1-2 meals per day. . NB-2.1 Physical inactivity As related to knee pain.  As evidenced by no usual exercise.    Intervention:  Nutrition education.  Monitoring/Evaluation:  Dietary intake, exercise, and body weight in 1 month.

## 2011-03-22 NOTE — Telephone Encounter (Signed)
Refill request

## 2011-03-22 NOTE — Patient Instructions (Addendum)
-   When you hear from [facility] about bariatric surg requirements, please call Dr. Gerilyn Pilgrim to let me know what they say.  If they don't call back today, call again tomorrow.   - Switch to 1% milk, and to fat-free milk after a month or two.   - Continue to move your upper body as much as you can (chair exercises) when your knees are too sore for walking.  - Food goals:  - Make lunch from home at least 2 X wk.  RECORD AN "L" ON CALENDAR WHEN YOU DO THIS, and bring calendar to follow-up appt.    - Eliminate sweet tea, and substitute water as well as unsweetened tea with [place] (artificial flavor/sweetener) added.    - At least one vegetable serving daily.  RECORD A "V" ON CALENDAR WHEN YOU DO THIS, and bring calendar to follow-up appt.

## 2011-03-22 NOTE — Telephone Encounter (Signed)
Pt has been trying to get refill on her Diflucan and pharmacy states they have faxed it- she is in great need of this asap. [facility]- [address]

## 2011-03-27 ENCOUNTER — Ambulatory Visit: Payer: Medicaid Other | Admitting: Family Medicine

## 2011-04-11 ENCOUNTER — Other Ambulatory Visit: Payer: Self-pay | Admitting: Family Medicine

## 2011-04-12 NOTE — Telephone Encounter (Signed)
Refill request

## 2011-04-20 ENCOUNTER — Ambulatory Visit: Payer: Medicaid Other | Admitting: Family Medicine

## 2011-05-09 ENCOUNTER — Ambulatory Visit: Payer: Medicaid Other | Admitting: Family Medicine

## 2011-05-09 ENCOUNTER — Other Ambulatory Visit: Payer: Self-pay | Admitting: Family Medicine

## 2011-05-09 DIAGNOSIS — Z1231 Encounter for screening mammogram for malignant neoplasm of breast: Secondary | ICD-10-CM

## 2011-05-15 ENCOUNTER — Ambulatory Visit (INDEPENDENT_AMBULATORY_CARE_PROVIDER_SITE_OTHER): Payer: Medicaid Other | Admitting: Family Medicine

## 2011-05-15 DIAGNOSIS — K529 Noninfective gastroenteritis and colitis, unspecified: Secondary | ICD-10-CM

## 2011-05-15 DIAGNOSIS — K5289 Other specified noninfective gastroenteritis and colitis: Secondary | ICD-10-CM

## 2011-05-15 MED ORDER — ONDANSETRON 4 MG PO TBDP: 4.0000 mg | ORAL_TABLET | Freq: Three times a day (TID) | ORAL | Status: AC | PRN

## 2011-05-15 NOTE — Patient Instructions (Signed)
Take the Zofran every 8 hours if you need it. Take the Ibuprofen for your headaches.  Keep drinking as much as possible.

## 2011-05-15 NOTE — Progress Notes (Signed)
  Subjective:    Patient ID: Kelsey Bullock, female    DOB: 09-21-1960, 50 y.o.   MRN: [id number]  HPI #1. Diarrhea: 50 year old female seen today in clinic with her daughter and also similar symptoms. Daughter and son had diarrheal illness about 3-4 weeks ago and then resolved. Daughter began experiencing diarrhea last night patient also experiencing symptoms last night as well. Describes diarrhea as one to 2 episodes of loose watery stools. Patient has some mild nausea without vomiting. She is able to tolerate good by mouth fluid and food intake today. No fevers or chills. No actual abdominal pain.   Review of Systems See HPI above for review of systems.       Objective:   Physical Exam Gen:  Alert, cooperative patient who appears stated age in no acute distress.  Vital signs reviewed. Mouth:  MMM Abd:  Soft/nondistended/nontender.  Good bowel sounds throughout all four quadrants.  No masses noted.         Assessment & Plan:

## 2011-05-15 NOTE — Assessment & Plan Note (Signed)
Most likely diagnosis. Much less likely something toxic such as C. difficile colitis is no real health care association in the past several months. Supportive treatment for now. Provide Zofran for relief. Red flag and warnings provided.

## 2011-05-16 ENCOUNTER — Telehealth: Payer: Self-pay | Admitting: Family Medicine

## 2011-05-16 NOTE — Telephone Encounter (Addendum)
Mom calling to say child is being picked up from school today due to vomitting.  Mom don't know if she should bring her back in or not.  Will need a note for her being out today as well.  Please call her back with advice.  This phone note was for Mrs. Vercher's daughter [name].

## 2011-05-16 NOTE — Telephone Encounter (Signed)
See Ariel Whitmoyer's chart for phone call info.  Gaylene Brooks, RN

## 2011-05-17 ENCOUNTER — Telehealth: Payer: Self-pay | Admitting: Family Medicine

## 2011-05-17 NOTE — Telephone Encounter (Signed)
Pt called to say she was here the other day and her head is still pounding and is still nauseated.  Wants to know if this is normal

## 2011-05-17 NOTE — Telephone Encounter (Signed)
lvm for pt to call back..Derryck Shahan Lynetta  

## 2011-05-17 NOTE — Telephone Encounter (Signed)
Called and returned message.  Patient has not had anything to eat except for Jello yesterday.  Able to drink both water and ginger ale.  Has taken 1 800 mg Ibuprofen and received enough relief she could fall asleep yesterday.  Has not taken anything today for relief.  Recommended she schedule Ibuprofen 800 mg TID and Zofran TID for today and tomorrow.  If no improvement by Friday she needs to come back, or if she feels worse she should be seen sooner.  Patient expressed understanding.  I see that she has diagnosis of Pseudotumor cerebri.  As she has concurrent gastroenteritis, attempt treatment with Zofran and Ibuprofen for relief and monitor for improvement.

## 2011-06-01 ENCOUNTER — Other Ambulatory Visit: Payer: Self-pay | Admitting: Family Medicine

## 2011-06-01 NOTE — Telephone Encounter (Signed)
Refill request

## 2011-06-15 ENCOUNTER — Ambulatory Visit
Admission: RE | Admit: 2011-06-15 | Discharge: 2011-06-15 | Disposition: A | Discharge status: Home / Self Care | Payer: Medicaid Other | Source: Ambulatory Visit | Attending: Family Medicine | Admitting: Family Medicine

## 2011-06-15 DIAGNOSIS — Z1231 Encounter for screening mammogram for malignant neoplasm of breast: Secondary | ICD-10-CM

## 2011-06-23 ENCOUNTER — Other Ambulatory Visit: Payer: Medicaid Other

## 2011-06-23 ENCOUNTER — Encounter: Payer: Self-pay | Admitting: Family Medicine

## 2011-06-23 ENCOUNTER — Ambulatory Visit: Payer: Medicaid Other | Admitting: Family Medicine

## 2011-06-23 ENCOUNTER — Ambulatory Visit (INDEPENDENT_AMBULATORY_CARE_PROVIDER_SITE_OTHER): Payer: Medicaid Other | Admitting: Family Medicine

## 2011-06-23 DIAGNOSIS — I10 Essential (primary) hypertension: Secondary | ICD-10-CM

## 2011-06-23 DIAGNOSIS — E669 Obesity, unspecified: Secondary | ICD-10-CM

## 2011-06-23 DIAGNOSIS — E7800 Pure hypercholesterolemia, unspecified: Secondary | ICD-10-CM

## 2011-06-23 NOTE — Assessment & Plan Note (Signed)
Due for lipid profile and check LFT

## 2011-06-23 NOTE — Assessment & Plan Note (Signed)
Good control on current meds.  Needs wt loss.  Also recheck renal function and K+

## 2011-06-23 NOTE — Patient Instructions (Signed)
The nurse will give you the information about contacting [facility] Please continue to work on weight loss.  Reconnect with Dr. Gerilyn Pilgrim. Your blood pressure is good today. I will call with lab results.

## 2011-06-23 NOTE — Progress Notes (Signed)
  Subjective:    Patient ID: Kelsey Bullock, female    DOB: 1961/03/20, 51 y.o.   MRN: [id number]  HPI Good news and bad. On the good side, Kelsey Bullock has no active complaints.  She is taking her medications and her blood pressure is good.  On the bad side, her weight has continued to inch up despite work with our nutritionist.  She had been exploring bariatric surgery with Dr. Emelda Fear in [facility] (?)  Apparently, he no longer takes Medicaid patients - or Medicaid no longer covers this procedure.  She is morbidly obese with a BMI = 64.  Fortunately, she has no suffered any permanent health sequeli.  Will refer to our bariatric surgery program.  It is only a matter of time before she has some devastating consequence.   Review of Systems     Objective:   Physical Exam Affect good Lungs clear  Cardiac RRR without m or g        Assessment & Plan:

## 2011-06-23 NOTE — Assessment & Plan Note (Signed)
Today BMI=64

## 2011-06-26 ENCOUNTER — Other Ambulatory Visit: Payer: Medicaid Other

## 2011-06-26 ENCOUNTER — Encounter: Payer: Self-pay | Admitting: Family Medicine

## 2011-06-26 ENCOUNTER — Other Ambulatory Visit: Payer: Self-pay | Admitting: Family Medicine

## 2011-06-26 ENCOUNTER — Ambulatory Visit (INDEPENDENT_AMBULATORY_CARE_PROVIDER_SITE_OTHER): Payer: Medicaid Other | Admitting: Family Medicine

## 2011-06-26 DIAGNOSIS — I10 Essential (primary) hypertension: Secondary | ICD-10-CM

## 2011-06-26 DIAGNOSIS — E7800 Pure hypercholesterolemia, unspecified: Secondary | ICD-10-CM

## 2011-06-26 DIAGNOSIS — J069 Acute upper respiratory infection, unspecified: Secondary | ICD-10-CM

## 2011-06-26 LAB — COMPLETE METABOLIC PANEL WITH GFR
ALT: 14 U/L (ref 0–35)
AST: 14 U/L (ref 0–37)
Albumin: 4.1 g/dL (ref 3.5–5.2)
Alkaline Phosphatase: 79 U/L (ref 39–117)
BUN: 14 mg/dL (ref 6–23)
CO2: 25 mEq/L (ref 19–32)
Calcium: 9.2 mg/dL (ref 8.4–10.5)
Chloride: 104 mEq/L (ref 96–112)
Creat: 0.63 mg/dL (ref 0.50–1.10)
GFR, Est African American: >89 mL/min
GFR, Est Non African American: >89 mL/min
Glucose, Bld: 120 mg/dL — ABNORMAL HIGH (ref 70–99)
Potassium: 4.4 mEq/L (ref 3.5–5.3)
Sodium: 138 mEq/L (ref 135–145)
Total Bilirubin: 0.6 mg/dL (ref 0.3–1.2)
Total Protein: 6.6 g/dL (ref 6.0–8.3)

## 2011-06-26 LAB — TSH: TSH: 0.642 u[IU]/mL (ref 0.350–4.500)

## 2011-06-26 MED ORDER — CETIRIZINE HCL 10 MG PO TABS: 10.0000 mg | ORAL_TABLET | Freq: Every day | ORAL | Status: DC

## 2011-06-26 NOTE — Assessment & Plan Note (Signed)
URI with nasal congestion x 1 day as primary complain0t.  No flare of asthma.  Advised symptomatic treatment and red flags for return.  Gave handout on nasal saline irrigation, and "choosing wisely: treating sinusitis"

## 2011-06-26 NOTE — Progress Notes (Signed)
  Subjective:    Patient ID: Kelsey Bullock, female    DOB: [birth year], 51 y.o.   MRN: [id number]  HPI Work in appt for nasal congestion  Yesterday started having nasal congestion and discharge bilaterally.  Subjective fever and chills.  Facial pressure, itchy eyes and nose.  Minimal cough.  No increased dyspnea but harder to breathe when she cant breathe out of her nose.  Compliant with asthma meds and flonase.  I have reviewed patient's  PMH, FH, and Social history and Medications as related to this visit.  Sig for asthma, OSA  Review of Systems General:  Positive  for subjective fever, chill HEENT: Positive for bilateral ear pressure, nasal congestion. Negative for conjunctivitis, ear  drainage, sore throat Respiratory:  Negative for  sputum, dyspnea Abdomen: Negative for abdominal pain, emesis, diarrhea        Objective:   Physical Exam  GEN: Alert & Oriented, No acute distress, morbidly obese HEENT: [place]/AT. EOMI, PERRLA, no conjunctival injection or scleral icterus.  Bilateral tympanic membranes intact without erythema or effusion.  .  Nares with boggy turbinates.  Oropharynx is without erythema or exudates.  No anterior or posterior cervical lymphadenopathy. CV:  Regular Rate & Rhythm, no murmur Respiratory:  Normal work of breathing, CTAB       Assessment & Plan:

## 2011-06-26 NOTE — Progress Notes (Signed)
CMP,CBC,FLP AND TSH DONE TODAY Kelsey Bullock 

## 2011-06-26 NOTE — Patient Instructions (Signed)
Sent prescription for allergy medicine cetirizine to your pharmacy- can help with itchy eyes and nose  See info on nasal saline rinse  OK to use afrin for 2-3 days.  If longer than this can cause congestion to come back when you stop using afrin  Come back if worsens after 7-10 days, high fever, or other concerns

## 2011-06-27 LAB — CBC
HCT: 44.2 % (ref 36.0–46.0)
Hemoglobin: 13.7 g/dL (ref 12.0–15.0)
MCH: 29.1 pg (ref 26.0–34.0)
MCHC: 31 g/dL (ref 30.0–36.0)
MCV: 94 fL (ref 78.0–100.0)
Platelets: 198 10*3/uL (ref 150–400)
RBC: 4.7 MIL/uL (ref 3.87–5.11)
RDW: 15.1 % (ref 11.5–15.5)
WBC: 9.2 10*3/uL (ref 4.0–10.5)

## 2011-06-30 LAB — LIPID PANEL
Cholesterol: 171 mg/dL (ref 0–200)
HDL: 48 mg/dL (ref >39)
LDL Cholesterol: 91 mg/dL (ref 0–99)
Total CHOL/HDL Ratio: 3.6 Ratio
Triglycerides: 158 mg/dL — ABNORMAL HIGH (ref <150)
VLDL: 32 mg/dL (ref 0–40)

## 2011-07-03 ENCOUNTER — Encounter: Payer: Self-pay | Admitting: Family Medicine

## 2011-07-03 ENCOUNTER — Other Ambulatory Visit: Payer: Self-pay | Admitting: Family Medicine

## 2011-07-03 DIAGNOSIS — R739 Hyperglycemia, unspecified: Secondary | ICD-10-CM

## 2011-07-03 NOTE — Assessment & Plan Note (Signed)
FBS=120.  Will check A1C to guide beginning meds.  Theoretically, she is already working on diet and exercise.

## 2011-07-05 ENCOUNTER — Other Ambulatory Visit (INDEPENDENT_AMBULATORY_CARE_PROVIDER_SITE_OTHER): Payer: Medicaid Other

## 2011-07-05 DIAGNOSIS — E119 Type 2 diabetes mellitus without complications: Secondary | ICD-10-CM

## 2011-07-05 DIAGNOSIS — R7309 Other abnormal glucose: Secondary | ICD-10-CM

## 2011-07-05 DIAGNOSIS — R739 Hyperglycemia, unspecified: Secondary | ICD-10-CM

## 2011-07-05 LAB — POCT GLYCOSYLATED HEMOGLOBIN (HGB A1C): Hemoglobin A1C: 6.5

## 2011-07-05 NOTE — Progress Notes (Signed)
A1c = 6.5% [name], MLS

## 2011-07-05 NOTE — Assessment & Plan Note (Signed)
A1C not normal, but does not require meds at this point.  Dx of diabetes is justified.

## 2011-07-12 ENCOUNTER — Other Ambulatory Visit: Payer: Self-pay | Admitting: Family Medicine

## 2011-07-12 NOTE — Telephone Encounter (Signed)
Refill request

## 2011-07-14 ENCOUNTER — Encounter: Payer: Self-pay | Admitting: Family Medicine

## 2011-07-14 ENCOUNTER — Ambulatory Visit (INDEPENDENT_AMBULATORY_CARE_PROVIDER_SITE_OTHER): Payer: Medicaid Other | Admitting: Family Medicine

## 2011-07-14 VITALS — BP 150/89 | HR 84 | Temp 98.1°F | Ht 65.5 in | Wt 396.5 lb

## 2011-07-14 DIAGNOSIS — S93402A Sprain of unspecified ligament of left ankle, initial encounter: Secondary | ICD-10-CM

## 2011-07-14 DIAGNOSIS — S93409A Sprain of unspecified ligament of unspecified ankle, initial encounter: Secondary | ICD-10-CM

## 2011-07-14 DIAGNOSIS — M25579 Pain in unspecified ankle and joints of unspecified foot: Secondary | ICD-10-CM

## 2011-07-14 NOTE — Assessment & Plan Note (Signed)
No need to Xray based on [employer].  Given brace.  Ice and elevation.  Advised to use cane for support and limit activity.

## 2011-07-14 NOTE — Patient Instructions (Signed)
Use cane Ice and elevation as long as swelling. This may take 6 weeks to heal. Be careful, you will be prone to reinjury for 6 months.

## 2011-07-14 NOTE — Progress Notes (Signed)
  Subjective:    Patient ID: Kelsey Bullock, female    DOB: 1960/09/16, 51 y.o.   MRN: [id number]  HPI  Patient was in [facility] yesterday.  She fell and twisted her ankle as she was getting of the in-store motorized scooter.  Was able to walk immediately afterward but considerable pain.      Review of Systems     Objective:   Physical ExamLt ankle stable but swollen with contusion.  Point tender over anterior talo fibular ligament and over fibulo calcaneal ligament.  Markedly less tender over lateral maleolus and base of 5th metatarsal.        Assessment & Plan:

## 2011-07-19 ENCOUNTER — Other Ambulatory Visit: Payer: Self-pay | Admitting: Family Medicine

## 2011-07-20 ENCOUNTER — Other Ambulatory Visit: Payer: Self-pay | Admitting: Family Medicine

## 2011-07-20 NOTE — Telephone Encounter (Signed)
Refill request

## 2011-08-12 ENCOUNTER — Other Ambulatory Visit: Payer: Self-pay | Admitting: Family Medicine

## 2011-08-13 NOTE — Telephone Encounter (Signed)
Refill request

## 2011-08-16 ENCOUNTER — Telehealth: Payer: Self-pay | Admitting: Family Medicine

## 2011-08-16 NOTE — Telephone Encounter (Signed)
Records printed and faxed with forms to [facility] [phone].  Ileana Ladd

## 2011-08-16 NOTE — Telephone Encounter (Signed)
Patient dropped off form to be filled out for bariatric surgery.  Please fax to [phone] when completed.

## 2011-09-13 ENCOUNTER — Other Ambulatory Visit: Payer: Self-pay | Admitting: Family Medicine

## 2011-09-27 ENCOUNTER — Ambulatory Visit (INDEPENDENT_AMBULATORY_CARE_PROVIDER_SITE_OTHER): Payer: Medicaid Other | Admitting: Family Medicine

## 2011-09-27 ENCOUNTER — Telehealth: Payer: Self-pay | Admitting: Family Medicine

## 2011-09-27 VITALS — BP 176/82 | HR 83 | Temp 98.4°F | Ht 65.5 in | Wt 399.0 lb

## 2011-09-27 DIAGNOSIS — R300 Dysuria: Secondary | ICD-10-CM

## 2011-09-27 DIAGNOSIS — R32 Unspecified urinary incontinence: Secondary | ICD-10-CM | POA: Insufficient documentation

## 2011-09-27 DIAGNOSIS — I10 Essential (primary) hypertension: Secondary | ICD-10-CM

## 2011-09-27 LAB — POCT URINALYSIS DIPSTICK
Bilirubin, UA: NEGATIVE
Blood, UA: NEGATIVE
Glucose, UA: NEGATIVE
Ketones, UA: NEGATIVE
Leukocytes, UA: NEGATIVE
Nitrite, UA: NEGATIVE
Protein, UA: NEGATIVE
Spec Grav, UA: 1.02
Urobilinogen, UA: 0.2
pH, UA: 6.5

## 2011-09-27 NOTE — Progress Notes (Signed)
  Subjective:    Patient ID: Kelsey Bullock, female    DOB: 02-17-1961, 51 y.o.   MRN: [id number]  HPI dysuria: Off and on - 2-3 x per day.  Not every time she urinates.  Mild increase in urine frequency. No retention.   Off and on back pain- chronic- not a new symptom, not worsened from baseline.  No fever.  No rashes.  Occasional lower abd pain off and on chronically but seems a little worse x 2 week-   Smoking status reviewed.   Review of Systems As per above.     Objective:   Physical Exam  Constitutional: She appears well-developed and well-nourished.       obese  HENT:  Head: Normocephalic and atraumatic.  Eyes: Pupils are equal, round, and reactive to light. Right eye exhibits no discharge. Left eye exhibits no discharge.  Cardiovascular: Normal rate, regular rhythm and normal heart sounds.   No murmur heard. Pulmonary/Chest: Effort normal. No respiratory distress. She has no wheezes. She has no rales.  Abdominal: Soft. She exhibits no distension. There is no tenderness. There is no rebound and no guarding.  Musculoskeletal:       No CVA tenderness  Neurological: She is alert.  Skin: No rash noted.  Psychiatric: She has a normal mood and affect.          Assessment & Plan:

## 2011-09-27 NOTE — Assessment & Plan Note (Signed)
Cause of dysuria unclear- u/a wnl.  Urine culture pending.  Pt to monitor symptoms closely and return for f/up with Dr. Leveda Anna if no improvement or if new or worsening of symptoms.

## 2011-09-27 NOTE — Telephone Encounter (Signed)
Called patient and told her that we will not write a letter for not wearing a seatbelt, there is no reason she should not have had it on. She was ok with that.Kelsey Bullock, Rodena Medin

## 2011-09-27 NOTE — Telephone Encounter (Signed)
Pt was here this AM for abd pain.  After she left, she was pulled over by police because she didn't have a seat belt on.  She was having such bad pains was the reason she didn't have it on.  He told her that she would have to pay the fine unless she can get a doctors note okaying her not wearing the seat belt. pls advise

## 2011-09-27 NOTE — Patient Instructions (Signed)
It isn't clear if you have a urinary tract infection or not.  U/A is normal. Will send a urine culture. F/up with Dr. Leveda Anna if any new or worsening of symptoms.

## 2011-09-27 NOTE — Assessment & Plan Note (Addendum)
Noticed after pt left treatment area at today's visit- that bp elevated to 176/82-  bp not typically this elevated. Called pt to let her know what her reading was and asked her to make an appointment with Dr. Leveda Anna in the next 1-2 weeks to follow up on Blood pressure.  No answer, left message on voicemail.

## 2011-09-29 LAB — URINE CULTURE: Colony Count: 75000

## 2011-10-12 ENCOUNTER — Telehealth: Payer: Self-pay | Admitting: *Deleted

## 2011-10-12 NOTE — Telephone Encounter (Signed)
Patient sees Dr. Ardyth Harps at [facility] for possible bariatric surgery.  They have received some medical records for 2012 that discuss nutrition/diet issues, but they need any records from 2011 and 2013 that documents patient's diet attempts, nutritionist appts, etc...  Patient has Medicaid and they require 2 years of diet attempt to be considered for bariatric surgery.  Can fax records to [phone].  Gaylene Brooks, RN

## 2011-10-13 NOTE — Telephone Encounter (Signed)
Printed and faxed. Also included notes from [employer]

## 2011-10-27 ENCOUNTER — Ambulatory Visit (INDEPENDENT_AMBULATORY_CARE_PROVIDER_SITE_OTHER): Payer: Medicaid Other | Admitting: Family Medicine

## 2011-10-27 ENCOUNTER — Encounter: Payer: Self-pay | Admitting: Family Medicine

## 2011-10-27 VITALS — BP 143/85 | HR 89 | Temp 98.7°F | Ht 65.5 in | Wt 399.0 lb

## 2011-10-27 DIAGNOSIS — E119 Type 2 diabetes mellitus without complications: Secondary | ICD-10-CM

## 2011-10-27 DIAGNOSIS — I10 Essential (primary) hypertension: Secondary | ICD-10-CM

## 2011-10-27 LAB — POCT GLYCOSYLATED HEMOGLOBIN (HGB A1C): Hemoglobin A1C: 6.5

## 2011-10-27 MED ORDER — LISINOPRIL 40 MG PO TABS: 40.0000 mg | ORAL_TABLET | Freq: Every day | ORAL | Status: DC

## 2011-10-27 MED ORDER — HYDROCHLOROTHIAZIDE 25 MG PO TABS: 25.0000 mg | ORAL_TABLET | Freq: Every day | ORAL | Status: DC

## 2011-10-27 NOTE — Assessment & Plan Note (Signed)
Recommend bariatric surg

## 2011-10-27 NOTE — Patient Instructions (Signed)
Good luck with getting qualified for bariatric surgery.  I fully support this. No sweet tea.  Be a role model for your kids. Get your eyes checked and please have the doctor send me a report.  Make sure the eye doctor knows you are a diabetic I sent both blood pressure meds in.  Make sure you take BOTH

## 2011-10-27 NOTE — Assessment & Plan Note (Addendum)
Stable, still diet controled.  Encourage bariatric surgery.  Rec eye exam.

## 2011-10-27 NOTE — Assessment & Plan Note (Signed)
Poor control.  Restart HCTZ

## 2011-10-27 NOTE — Progress Notes (Signed)
  Subjective:    Patient ID: Kelsey Bullock, female    DOB: 1961/04/12, 51 y.o.   MRN: [id number]  HPI  Much is the same with Kelsey Bullock.  Single mom and all children have issues.  Daughter, Kelsey Bullock, had a "melt down" at school.  Son, Kelsey Bullock, has worsening obesity and compulsive eating.  All children are in counseling and being seen by a psychiatrist.    [name] is taking her meds and has no new medical complaints.  The biggest positive step she is making toward good health is that she is investigating bariatric surgery, which I wholeheartedly recommend.    BP up.  She has not been taking her HCTZ due to concerns about urinary frequency when she is in her car.      Review of Systems     Objective:   Physical Exam Lungs clear Cardiac RRR without m Ext 1+ edema.  Diabetic foot exam normal pulses and sensation.        Assessment & Plan:

## 2011-12-06 DIAGNOSIS — L719 Rosacea, unspecified: Secondary | ICD-10-CM | POA: Insufficient documentation

## 2011-12-06 DIAGNOSIS — I10 Essential (primary) hypertension: Secondary | ICD-10-CM | POA: Insufficient documentation

## 2011-12-06 DIAGNOSIS — F32A Depression, unspecified: Secondary | ICD-10-CM | POA: Insufficient documentation

## 2011-12-06 DIAGNOSIS — M797 Fibromyalgia: Secondary | ICD-10-CM | POA: Insufficient documentation

## 2011-12-06 DIAGNOSIS — K589 Irritable bowel syndrome without diarrhea: Secondary | ICD-10-CM | POA: Insufficient documentation

## 2011-12-06 DIAGNOSIS — K219 Gastro-esophageal reflux disease without esophagitis: Secondary | ICD-10-CM | POA: Insufficient documentation

## 2011-12-06 DIAGNOSIS — Z87442 Personal history of urinary calculi: Secondary | ICD-10-CM | POA: Insufficient documentation

## 2011-12-06 DIAGNOSIS — M810 Age-related osteoporosis without current pathological fracture: Secondary | ICD-10-CM | POA: Insufficient documentation

## 2012-01-21 ENCOUNTER — Other Ambulatory Visit: Payer: Self-pay | Admitting: Family Medicine

## 2012-02-28 ENCOUNTER — Other Ambulatory Visit: Payer: Self-pay | Admitting: *Deleted

## 2012-02-28 MED ORDER — TRIAMCINOLONE ACETONIDE 0.1 % EX OINT: TOPICAL_OINTMENT | Freq: Three times a day (TID) | CUTANEOUS | Status: DC

## 2012-02-29 ENCOUNTER — Telehealth: Payer: Self-pay | Admitting: *Deleted

## 2012-02-29 NOTE — Telephone Encounter (Signed)
Pharmacy received Rx yesterday for Triamcinolone.  Calling to verify if it should be used BID or TID.  Pharmacist informed patient should use med BID per Dr. Leveda Anna.  Gaylene Brooks, RN

## 2012-03-08 ENCOUNTER — Ambulatory Visit (INDEPENDENT_AMBULATORY_CARE_PROVIDER_SITE_OTHER): Payer: Medicaid Other | Admitting: *Deleted

## 2012-03-08 DIAGNOSIS — Z23 Encounter for immunization: Secondary | ICD-10-CM

## 2012-03-20 ENCOUNTER — Ambulatory Visit: Payer: Medicaid Other | Admitting: Family Medicine

## 2012-03-29 ENCOUNTER — Ambulatory Visit (INDEPENDENT_AMBULATORY_CARE_PROVIDER_SITE_OTHER): Payer: Medicaid Other | Admitting: Family Medicine

## 2012-03-29 ENCOUNTER — Encounter: Payer: Self-pay | Admitting: Family Medicine

## 2012-03-29 VITALS — BP 142/61 | HR 82 | Temp 97.7°F | Wt >= 6400 oz

## 2012-03-29 DIAGNOSIS — E119 Type 2 diabetes mellitus without complications: Secondary | ICD-10-CM

## 2012-03-29 DIAGNOSIS — F411 Generalized anxiety disorder: Secondary | ICD-10-CM

## 2012-03-29 DIAGNOSIS — I10 Essential (primary) hypertension: Secondary | ICD-10-CM

## 2012-03-29 LAB — POCT GLYCOSYLATED HEMOGLOBIN (HGB A1C): Hemoglobin A1C: 6.2

## 2012-03-29 MED ORDER — ASPIRIN 81 MG PO TBEC: 81.0000 mg | DELAYED_RELEASE_TABLET | Freq: Every day | ORAL | Status: DC

## 2012-03-29 NOTE — Assessment & Plan Note (Signed)
Currently diet controled.

## 2012-03-29 NOTE — Assessment & Plan Note (Signed)
At her baseline, which unfortunately is chronically dysfunctional.

## 2012-03-29 NOTE — Assessment & Plan Note (Signed)
Control fair.

## 2012-03-29 NOTE — Assessment & Plan Note (Signed)
Encouraged her to continue to pursue bariatric surgery.

## 2012-03-29 NOTE — Progress Notes (Signed)
  Subjective:    Patient ID: Kelsey Bullock, female    DOB: 1960-07-04, 51 y.o.   MRN: [id number]  HPI Sad with loss of "second mother." Pursuing bariatric surgery.  Kelsey Bullock continues to need considerable support in her life.  She feels alone and overwhelmed by the demands of child raising.  She is pursuing bariatric surgery option - but has gained 4 lbs on their supervised wt loss program. No new complaints. Has scheduled a mammogram Knows she needs an eye exam    Review of Systems     Objective:   Physical Exam Affect good. Lungs clear Trace - 1+ edema       Assessment & Plan:

## 2012-03-29 NOTE — Patient Instructions (Addendum)
Ask Dr. Sharyn Lull: generally you should not be on both Advair and Qvar Please start taking an 81 mg aspirin daily See me in February.  You will be due for blood work Your A1C today is good 6.2 Follow through on the counselor and bariatric surg. Follow through on nutritionist with [facility].

## 2012-03-30 ENCOUNTER — Other Ambulatory Visit: Payer: Self-pay | Admitting: Family Medicine

## 2012-04-28 ENCOUNTER — Other Ambulatory Visit: Payer: Self-pay | Admitting: Family Medicine

## 2012-05-09 ENCOUNTER — Other Ambulatory Visit: Payer: Self-pay | Admitting: *Deleted

## 2012-05-09 ENCOUNTER — Encounter: Payer: Self-pay | Admitting: Family Medicine

## 2012-05-09 DIAGNOSIS — K635 Polyp of colon: Secondary | ICD-10-CM | POA: Insufficient documentation

## 2012-05-10 MED ORDER — FAMOTIDINE 20 MG PO TABS: 20.0000 mg | ORAL_TABLET | Freq: Every day | ORAL | Status: DC

## 2012-05-22 ENCOUNTER — Encounter: Payer: Self-pay | Admitting: Family Medicine

## 2012-05-22 ENCOUNTER — Ambulatory Visit: Payer: Medicaid Other | Admitting: Family Medicine

## 2012-05-22 ENCOUNTER — Ambulatory Visit (INDEPENDENT_AMBULATORY_CARE_PROVIDER_SITE_OTHER): Payer: Medicaid Other | Admitting: Family Medicine

## 2012-05-22 VITALS — BP 160/74 | HR 82 | Ht 65.5 in | Wt 397.6 lb

## 2012-05-22 DIAGNOSIS — H00019 Hordeolum externum unspecified eye, unspecified eyelid: Secondary | ICD-10-CM

## 2012-05-22 DIAGNOSIS — H00015 Hordeolum externum left lower eyelid: Secondary | ICD-10-CM

## 2012-05-22 MED ORDER — AZITHROMYCIN 1 % OP SOLN: 1.0000 [drp] | Freq: Every day | OPHTHALMIC | Status: DC

## 2012-05-22 NOTE — Patient Instructions (Addendum)
It was nice to meet you today.  I am sending in an antibiotic ointment that you can put in your eye if you want.  The main thing to do is warm compresses.  You can use Tylenol or Motrin to help with the pain.  Come back if it is not going away within 3-4 weeks.  Come back sooner if it starts getting more red, or the redness is spreading, or if you start having fevers.     Sty A sty (hordeolum) is an infection of a gland in the eyelid located at the base of the eyelash. A sty may develop a white or yellow head of pus. It can be puffy (swollen). Usually, the sty will burst and pus will come out on its own. They do not leave lumps in the eyelid once they drain. A sty is often confused with another form of cyst of the eyelid called a chalazion. Chalazions occur within the eyelid and not on the edge where the bases of the eyelashes are. They often are red, sore and then form firm lumps in the eyelid. CAUSES   Germs (bacteria).  Lasting (chronic) eyelid inflammation. SYMPTOMS   Tenderness, redness and swelling along the edge of the eyelid at the base of the eyelashes.  Sometimes, there is a white or yellow head of pus. It may or may not drain. DIAGNOSIS  An ophthalmologist will be able to distinguish between a sty and a chalazion and treat the condition appropriately.  TREATMENT   Styes are typically treated with warm packs (compresses) until drainage occurs.  In rare cases, medicines that kill germs (antibiotics) may be prescribed. These antibiotics may be in the form of drops, cream or pills.  If a hard lump has formed, it is generally necessary to do a small incision and remove the hardened contents of the cyst in a minor surgical procedure done in the office.  In suspicious cases, your caregiver may send the contents of the cyst to the lab to be certain that it is not a rare, but dangerous form of cancer of the glands of the eyelid. HOME CARE INSTRUCTIONS   Wash your hands often  and dry them with a clean towel. Avoid touching your eyelid. This may spread the infection to other parts of the eye.  Apply heat to your eyelid for 10 to 20 minutes, several times a day, to ease pain and help to heal it faster.  Do not squeeze the sty. Allow it to drain on its own. Wash your eyelid carefully 3 to 4 times per day to remove any pus. SEEK IMMEDIATE MEDICAL CARE IF:   Your eye becomes painful or puffy (swollen).  Your vision changes.  Your sty does not drain by itself within 3 days.  Your sty comes back within a short period of time, even with treatment.  You have redness (inflammation) around the eye.  You have a fever. Document Released: 03/01/2005 Document Revised: 08/14/2011 Document Reviewed: 11/03/2008 [facility] Patient Information 2013 [place], [place].

## 2012-05-22 NOTE — Progress Notes (Signed)
S: Pt comes in today for SDA for stye in her eye.  She reports significant pain in her left eye.  2 days ago felt pain in middle of bottom eyelid- big round red area inside of lower lid.  Redness on inside corner of her eye started yesterday.  No drainage.  No fevers/chills.  Has had this 1 time before, a long time ago- years ago.  Did put on eye liner 5-6 days ago and is wondering if this related.  Tried warm compress yesterday morning x1.  No vision changes.  Does feel like her eye is itching. No other facial pain or swelling.    ROS: Per HPI  History  Smoking status  . Never Smoker   Smokeless tobacco  . Never Used    O:  Filed Vitals:   05/22/12 0919  BP: 160/74  Pulse: 82    Gen: NAD HEENT: MMM, EOMI, PERRLA, R eye normal in appearance, L eye with 5mm erythematous cyst, more prominent on the interior vs exterior of the medial lower eye lid in the corner of the L eye with surrounding local erythema that does not extend, + TTP over inflamed area, sclera normal without injection or irritation    A/P: 51 y.o. female p/w hordeolum  -See problem list -f/u in PRN

## 2012-05-22 NOTE — Assessment & Plan Note (Signed)
Staple of treatment is warm compresses and time.  Did Rx azithro eye drops for pt-- precepted with Dr. Earnest Bailey.  Red flags for return discussed, otherwise pt will f/u if not improved in 3-4 weeks for possible Optho referral to have the stye drained/surgically removed.

## 2012-06-17 ENCOUNTER — Other Ambulatory Visit: Payer: Self-pay | Admitting: Family Medicine

## 2012-06-17 DIAGNOSIS — Z1231 Encounter for screening mammogram for malignant neoplasm of breast: Secondary | ICD-10-CM

## 2012-06-27 ENCOUNTER — Other Ambulatory Visit: Payer: Self-pay | Admitting: Family Medicine

## 2012-06-28 NOTE — Telephone Encounter (Signed)
Dear Clif[facility] Asters Team I will likely approv3 this refill but I am unclear what she is taking it for---is she really taking one a day for 2 weeks? If she can give me some more info it would be helpful--Dr H is out of town until Monday [facility]! Denny Levy

## 2012-07-16 ENCOUNTER — Ambulatory Visit
Admission: RE | Admit: 2012-07-16 | Discharge: 2012-07-16 | Disposition: A | Discharge status: Home / Self Care | Payer: Medicaid Other | Source: Ambulatory Visit | Attending: Family Medicine | Admitting: Family Medicine

## 2012-07-16 ENCOUNTER — Other Ambulatory Visit: Payer: Self-pay | Admitting: Family Medicine

## 2012-07-16 DIAGNOSIS — Z1231 Encounter for screening mammogram for malignant neoplasm of breast: Secondary | ICD-10-CM

## 2012-07-17 ENCOUNTER — Ambulatory Visit: Payer: Medicaid Other

## 2012-07-18 ENCOUNTER — Ambulatory Visit: Payer: Medicaid Other

## 2012-07-19 ENCOUNTER — Ambulatory Visit: Payer: Medicaid Other | Admitting: Family Medicine

## 2012-08-02 ENCOUNTER — Other Ambulatory Visit: Payer: Self-pay | Admitting: Family Medicine

## 2012-08-06 ENCOUNTER — Other Ambulatory Visit: Payer: Self-pay | Admitting: Family Medicine

## 2012-09-02 ENCOUNTER — Other Ambulatory Visit: Payer: Self-pay | Admitting: Family Medicine

## 2012-09-02 MED ORDER — MOMETASONE FURO-FORMOTEROL FUM 200-5 MCG/ACT IN AERO: 2.0000 | INHALATION_SPRAY | Freq: Two times a day (BID) | RESPIRATORY_TRACT | Status: DC

## 2012-09-02 MED ORDER — BECLOMETHASONE DIPROPIONATE 80 MCG/ACT IN AERS: 2.0000 | INHALATION_SPRAY | Freq: Two times a day (BID) | RESPIRATORY_TRACT | Status: DC

## 2012-09-02 MED ORDER — MONTELUKAST SODIUM 10 MG PO TABS: 10.0000 mg | ORAL_TABLET | Freq: Every day | ORAL | Status: DC

## 2012-09-02 NOTE — Progress Notes (Signed)
Updated med list based on faxed allergist follow up visit.

## 2012-09-06 ENCOUNTER — Other Ambulatory Visit: Payer: Self-pay | Admitting: Family Medicine

## 2012-09-09 ENCOUNTER — Encounter: Payer: Self-pay | Admitting: Family Medicine

## 2012-09-09 ENCOUNTER — Ambulatory Visit (INDEPENDENT_AMBULATORY_CARE_PROVIDER_SITE_OTHER): Payer: Medicaid Other | Admitting: Family Medicine

## 2012-09-09 VITALS — BP 138/83 | HR 80 | Temp 98.3°F | Ht 65.5 in | Wt >= 6400 oz

## 2012-09-09 DIAGNOSIS — H1045 Other chronic allergic conjunctivitis: Secondary | ICD-10-CM

## 2012-09-09 DIAGNOSIS — H1013 Acute atopic conjunctivitis, bilateral: Secondary | ICD-10-CM | POA: Insufficient documentation

## 2012-09-09 MED ORDER — OLOPATADINE HCL 0.2 % OP SOLN: OPHTHALMIC | Status: DC

## 2012-09-09 NOTE — Progress Notes (Signed)
  Subjective:    Patient ID: Kelsey Bullock, female    DOB: 1960/11/02, 52 y.o.   MRN: [id number]  HPI appt for several months of eye drainage  Has been seen for a stye here and with an opthomologist  Brings in medications for eyes- antibiotic ointments and drops and treatment for blepharitis.  Reports crusting of bilateral eyes comes and goes with itching.  No change in vision- had vision check recently with eye MD.  NO pain, photophobia.  Take cetrizine for allergy symptoms.    Review of Systems See HPI    Objective:   Physical Exam GEN: NAD EYES:  Eomi, perrla.  No conjunctivitis.  Some mild crusting seen in lower eyelids but no on lash line.       Assessment & Plan:

## 2012-09-09 NOTE — Assessment & Plan Note (Signed)
Given symptoms, will treat for allergic conjunctivitis with pataday.  If continues, advised to follow-up with EYE MD as may need continued evaluation for blepharitis which may be true cause of eye symptoms.

## 2012-09-09 NOTE — Patient Instructions (Addendum)
Prescription for allergy eye drops sent to pharmacy

## 2012-09-19 ENCOUNTER — Other Ambulatory Visit: Payer: Self-pay | Admitting: Family Medicine

## 2012-10-08 ENCOUNTER — Other Ambulatory Visit: Payer: Self-pay | Admitting: Family Medicine

## 2012-10-18 ENCOUNTER — Other Ambulatory Visit: Payer: Self-pay | Admitting: Family Medicine

## 2012-11-08 ENCOUNTER — Telehealth: Payer: Self-pay | Admitting: *Deleted

## 2012-11-08 NOTE — Telephone Encounter (Signed)
Pt reports thrush in her mouth and is using "old" Magic Mouthwash - recommended to stop using old med. Follow up with appointment on Monday or UC for more immediate treatment. Wyatt Haste, RN-BSN

## 2012-11-09 DIAGNOSIS — F4310 Post-traumatic stress disorder, unspecified: Secondary | ICD-10-CM | POA: Insufficient documentation

## 2012-11-09 DIAGNOSIS — F429 Obsessive-compulsive disorder, unspecified: Secondary | ICD-10-CM | POA: Insufficient documentation

## 2012-11-11 ENCOUNTER — Ambulatory Visit (INDEPENDENT_AMBULATORY_CARE_PROVIDER_SITE_OTHER): Payer: Medicaid Other | Admitting: Family Medicine

## 2012-11-11 ENCOUNTER — Encounter: Payer: Self-pay | Admitting: Family Medicine

## 2012-11-11 VITALS — BP 150/77 | HR 87 | Ht 65.5 in | Wt >= 6400 oz

## 2012-11-11 DIAGNOSIS — B370 Candidal stomatitis: Secondary | ICD-10-CM

## 2012-11-11 MED ORDER — NYSTATIN 100000 UNIT/ML MT SUSP: 500000.0000 [IU] | Freq: Four times a day (QID) | OROMUCOSAL | Status: DC

## 2012-11-11 NOTE — Patient Instructions (Addendum)
Be sure to wash your mouth out after using your inhalers  Thrush, Adult  Kelsey Bullock is a yeast infection that develops in the mouth and throat and on the tongue. The medical term for this is oropharyngeal candidiasis, or OPC. Kelsey Bullock is most common in older adults, but it can occur at any age. Kelsey Bullock occurs when a yeast called candida grows out of control. Candida normally is present in small amounts in the mouth and on other mucous membranes. However, under certain circumstances, candida can grow rapidly, causing thrush. Kelsey Bullock can be a recurring problem for people who have chronic illnesses or who take medications that limit the body's ability to fight infection (weakened immune system). Since these people have difficulty fighting infections, the fungus that causes thrush can spread throughout the body. This can cause life-threatening blood or organ infections. CAUSES  Candida, the yeast that causes thrush, is normally present in small amounts in the mouth and on other mucous membranes. It usually causes no harm. However, when conditions are present that allow the yeast to grow uncontrolled, it invades surrounding tissues and becomes an infection. Kelsey Bullock is most commonly caused by the yeast Candida albicans. Less often, other forms of candida can lead to thrush. There are many types of bacteria in your mouth that normally control the growth of candida. Sometimes a new type of bacteria gets into your mouth and disrupts the balance of the germs already there. This can allow candida to overgrow. Other factors that increase your risk of developing thrush include:  An impaired ability to fight infection (weakened immune system). A normal immune system is usually strong enough to prevent candida from overgrowing.  Older adults are more likely to develop thrush because they may have weaker immune systems.  People with human immunodeficiency virus (HIV) infection have a high likelihood of developing thrush. About  90% of people with HIV develop thrush at some point during the course of their disease.  People with diabetes are more likely to get thrush because high blood sugar levels promote overgrowth of the candida fungus.  A dry mouth (xerostomia). Dry mouth can result from overuse of mouthwashes or from certain conditions such as Sjgren's syndrome.  Pregnancy. Hormone changes during pregnancy can lead to thrush by altering the balance of bacteria in the mouth.  Poor dental care, especially in people who have false teeth.  The use of antibiotic medications. This may lead to thrush by changing the balance of bacteria in the mouth. SYMPTOMS  Thrush can be a mild infection that causes no symptoms. If symptoms develop, they may include the following:  A burning feeling in the mouth and throat. This can occur at the start of a thrush infection.  White patches that adhere to the mouth and tongue. The tissue around the patches may be red, raw, and painful. If rubbed (during tooth brushing, for example), the patches and the tissue of the mouth may bleed easily.  A bad taste in the mouth or difficulty tasting foods.  Cottony feeling in the mouth.  Sometimes pain during eating and swallowing. DIAGNOSIS  Your caregiver can usually diagnose thrush by exam. In addition to looking in your mouth, your caregiver will ask you questions about your health. TREATMENT  Medications that help prevent the growth of fungi (antifungals) are the standard treatment for thrush. These medications are either applied directly to the affected area (topical) or swallowed (oral). Mild thrush In adults, mild cases of thrush may clear up with simple treatment that can  be done at home. This treatment usually involves using an antifungal mouth rinse or lozenges. Treatment usually lasts about 14 days. Moderate to severe thrush  More severe thrush infections that have spread to the esophagus are treated with an oral antifungal  medication. A topical antifungal medication may also be used.  For some severe infections, a treatment period longer than 14 days may be needed.  Oral antifungal medications are almost never used during pregnancy because the fetus may be harmed. However, if a pregnant woman has a rare, severe thrush infection that has spread to her blood, oral antifungal medications may be used. In this case, the risk of harm to the mother and fetus from the severe thrush infection may be greater than the risk posed by the use of antifungal medications. Persistent or recurrent thrush Persistent (does not go away) or recurrent (keeps coming back) cases of thrush may:  Need to be treated twice as long as the symptoms last.  Require treatment with both oral and topical antifungal medications.  People with weakened immune systems can take an antifungal medication on a continuous basis to prevent thrush infections. It is important to treat conditions that make you more likely to get thrush, such as diabetes, human immunodeficiency virus (HIV), or cancer.  HOME CARE INSTRUCTIONS   If you are breast-feeding, you should clean your nipples with an antifungal medication, such as nystatin (Mycostatin). Dry your nipples after breast-feeding. Applying lanolin-containing body lotion may help relieve nipple soreness.  If you wear dentures and get thrush, remove dentures before going to bed, brush them vigorously, and soak in a solution of chlorhexidine gluconate or a product such as Polident or Efferdent.  Eating plain, unflavored yogurt that contains live cultures (check the label) can also help cure thrush. Yogurt helps healthy bacteria grow in the mouth. These bacteria stop the growth of the yeast that causes thrush.  Adults can treat thrush at home with gentian violet (1%), a dye that kills bacteria and fungi. It is available without a prescription. If there is no known cause for the infection or if gentian violet does  not cure the thrush, you need to see your caregiver. Comfort measures Measures can be taken to reduce the discomfort of thrush:  Drink cold liquids such as water or iced tea. Eat flavored ice treats or frozen juices.  Eat foods that are easy to swallow such as gelatin, ice cream, or custard.  If the patches are painful, try drinking from a straw.  Rinse your mouth several times a day with a warm saltwater rinse. You can make the saltwater mixture with 1 tsp (5 g) of salt in 8 fl oz (0.2 L) of warm water. PROGNOSIS   Most cases of thrush are mild and clear up with the use of an antifungal mouth rinse or lozenges. Very mild cases of thrush may clear up without medical treatment. It usually takes about 14 days of treatment with an oral antifungal medication to cure more severe thrush infections. In some cases, thrush may last several weeks even with treatment.  If thrush goes untreated and does not go away by itself, it can spread to other parts of the body.  Thrush can spread to the throat, the vagina, or the skin. It rarely spreads to other organs of the body. Kelsey Bullock is more likely to recur (come back) in:  People who use inhaled corticosteroids to treat asthma.  People who take antibiotic medications for a long time.  People who have  false teeth.  People who have a weakened immune system. RISKS AND COMPLICATIONS Complications related to thrush are rare in healthy people. There are several factors that can increase your risk of developing thrush. Age Older adults, especially those who have serious health problems, are more likely to develop thrush because their immune systems are likely to be weaker. Behavior  The yeast that causes thrush can be spread by oral sex.  Heavy smoking can lower the body's ability to fight off infections. This makes thrush more likely to develop. Other conditions  False teeth (dentures), braces, or a retainer that irritates the mouth make it hard to  keep the mouth clean. An unclean mouth is more likely to develop thrush than a clean mouth.  People with a weakened immune system, such as those who have diabetes or human immunodeficiency virus (HIV) or who are undergoing chemotherapy, have an increased risk for developing thrush. Medications Some medications can allow the fungus that causes thrush to grow uncontrolled. Common ones are:  Antibiotics, especially those that kill a wide range of organisms (broad-spectrum antibiotics), such as tetracycline commonly can cause thrush.  Birth control pills (oral contraceptives).  Medications that weaken the body's immune system, such as corticosteroids. Environment Exposure over time to certain environmental chemicals, such as benzene and pesticides, can weaken the body's immune system. This increases your risk for developing infections, including thrush. SEEK IMMEDIATE MEDICAL CARE IF:  Your symptoms are getting worse or are not improving within 7 days of starting treatment.  You have symptoms of spreading infection, such as white patches on the skin outside of the mouth.  You are nursing and you have redness and pain in the nipples in spite of home treatment or if you have burning pain in the nipple area when you nurse. Your baby's mouth should also be examined to determine whether thrush is causing your symptoms. Document Released: 02/15/2004 Document Revised: 08/14/2011 Document Reviewed: 05/27/2008 [facility] Patient Information 2014 [place], [place].

## 2012-11-11 NOTE — Assessment & Plan Note (Signed)
Exam appears to be consistent with oral thrush.  Discussed rinsing after using ICS.  Will treat with nystatin, follow up if not improving.

## 2012-11-11 NOTE — Progress Notes (Signed)
  Subjective:    Patient ID: Kelsey Bullock, female    DOB: 12/09/1960, 52 y.o.   MRN: [id number]  HPI  Here with complaint of mouth pain and film on her tongue.  Thinks that this is thrush as she has had this before.  She has been eating a lot of citrus fruits to help curb her appetite which she thinks is contributing to irritations as well.  She denies any difficulty swallowing, fever, chills.  She does use ICS for asthma.    Review of Systems Per HPI    Objective:   Physical Exam  Constitutional:  Morbidly obese, nad   HENT:  Tongue and cheeks with white patches, when scraped away reveals and erythematous base.    Pharynx clear   Lymphadenopathy:    She has no cervical adenopathy.          Assessment & Plan:

## 2012-11-19 ENCOUNTER — Telehealth: Payer: Self-pay | Admitting: Family Medicine

## 2012-11-19 MED ORDER — NYSTATIN 100000 UNIT/ML MT SUSP: 500000.0000 [IU] | Freq: Four times a day (QID) | OROMUCOSAL | Status: DC

## 2012-11-19 NOTE — Telephone Encounter (Signed)
Patient was in on 6/9 and is being treated for thrush.  Rec[facility]a Rx for mouthwash, but it was a small bottle and she has used it all and needs more sent to [facility] at [facility]

## 2012-11-19 NOTE — Telephone Encounter (Signed)
The bottle for Nystatin was so small that patient used Rx once a day instead of four times a day.  Rx filled on 11/11/12.  Two days she did use it twice a day.  Pt states thrush has improved some but not completely gone.  Will fwd to Md for advice on refill.   Kelsey Bullock, Kelsey Bullock, CMA

## 2012-11-19 NOTE — Telephone Encounter (Signed)
Pt notified.  Kelsey Bullock, CMA  

## 2012-11-19 NOTE — Telephone Encounter (Signed)
Dear [facility] Asters Team I have called in new rx of bigger botlle THANKS! Denny Levy

## 2012-11-20 ENCOUNTER — Other Ambulatory Visit: Payer: Self-pay | Admitting: Family Medicine

## 2012-12-12 ENCOUNTER — Other Ambulatory Visit: Payer: Self-pay

## 2013-01-08 ENCOUNTER — Encounter: Payer: Self-pay | Admitting: Family Medicine

## 2013-01-08 ENCOUNTER — Ambulatory Visit (INDEPENDENT_AMBULATORY_CARE_PROVIDER_SITE_OTHER): Payer: Medicaid Other | Admitting: Family Medicine

## 2013-01-08 VITALS — BP 135/82 | HR 90 | Wt >= 6400 oz

## 2013-01-08 DIAGNOSIS — I10 Essential (primary) hypertension: Secondary | ICD-10-CM

## 2013-01-08 DIAGNOSIS — J45909 Unspecified asthma, uncomplicated: Secondary | ICD-10-CM

## 2013-01-08 DIAGNOSIS — E119 Type 2 diabetes mellitus without complications: Secondary | ICD-10-CM

## 2013-01-08 DIAGNOSIS — F411 Generalized anxiety disorder: Secondary | ICD-10-CM

## 2013-01-08 DIAGNOSIS — Z23 Encounter for immunization: Secondary | ICD-10-CM

## 2013-01-08 LAB — POCT GLYCOSYLATED HEMOGLOBIN (HGB A1C): Hemoglobin A1C: 6.6

## 2013-01-08 MED ORDER — PNEUMOCOCCAL VAC POLYVALENT 25 MCG/0.5ML IJ INJ: 0.5000 mL | INJECTION | Freq: Once | INTRAMUSCULAR | Status: DC

## 2013-01-08 NOTE — Patient Instructions (Addendum)
Keep looking for a way to get the obesity surgery. Get your fasting blood work done on Friday morning. Stay on your same medications. Remember, the weight is the major thing about your health Good luck to your sick relative.  Thank you for caring for them.

## 2013-01-09 NOTE — Assessment & Plan Note (Signed)
Stable on meds  

## 2013-01-09 NOTE — Progress Notes (Signed)
  Subjective:    Patient ID: Kelsey Bullock, female    DOB: November 27, 1960, 52 y.o.   MRN: [id number]  HPIDonna comes in for a recheck of her multiple problems.  Most of which are related to either her morbid obesity or her poor psycho/social/financial situation.  Med rec took up half the visit.  She nicely brought in all her meds - it is impressive that she requires a host of medications to function. Of course my concern is that unless her weight changes soon, we will run out of options to control her symptoms. She is pursuing bariatric surg option and is finding it difficulty to find a program that accepts Medicaid with co pays that she can afford.  Encouraged her to keep looking.   Breathing: sseing allergist/pulmonologist.  No recent hospitalizations or ER visits. Depression under decent control - sees psych.  No SI or HI.  Coping with her kids.  Daughter doing very well.  Both sons struggling.    Review of Systems     Objective:   Physical Exam VS noted (my BP was a late entry) HEENT nl Neck supple Lungs clear - decreased vital capcity. Cardiac RRR without m or g Abd limited due to obesity grossly normal Ext 1+ symmetric edema.       Assessment & Plan:

## 2013-01-09 NOTE — Assessment & Plan Note (Signed)
The root of most of her problems.  Continue to pursue bariatric surg option.

## 2013-01-09 NOTE — Assessment & Plan Note (Signed)
folllowed by psych.

## 2013-01-09 NOTE — Assessment & Plan Note (Signed)
Stable: followed by allergy/pulm

## 2013-01-10 ENCOUNTER — Other Ambulatory Visit: Payer: Medicaid Other

## 2013-01-10 DIAGNOSIS — I10 Essential (primary) hypertension: Secondary | ICD-10-CM

## 2013-01-10 LAB — COMPLETE METABOLIC PANEL WITH GFR
ALT: 11 U/L (ref 0–35)
AST: 13 U/L (ref 0–37)
Albumin: 3.6 g/dL (ref 3.5–5.2)
Alkaline Phosphatase: 82 U/L (ref 39–117)
BUN: 10 mg/dL (ref 6–23)
CO2: 25 mEq/L (ref 19–32)
Calcium: 8.9 mg/dL (ref 8.4–10.5)
Chloride: 105 mEq/L (ref 96–112)
Creat: 0.74 mg/dL (ref 0.50–1.10)
GFR, Est African American: >89 mL/min
GFR, Est Non African American: >89 mL/min
Glucose, Bld: 105 mg/dL — ABNORMAL HIGH (ref 70–99)
Potassium: 4.5 mEq/L (ref 3.5–5.3)
Sodium: 140 mEq/L (ref 135–145)
Total Bilirubin: 0.6 mg/dL (ref 0.3–1.2)
Total Protein: 6.4 g/dL (ref 6.0–8.3)

## 2013-01-10 LAB — CBC
HCT: 40.1 % (ref 36.0–46.0)
Hemoglobin: 13.6 g/dL (ref 12.0–15.0)
MCH: 30 pg (ref 26.0–34.0)
MCHC: 33.9 g/dL (ref 30.0–36.0)
MCV: 88.3 fL (ref 78.0–100.0)
Platelets: 238 10*3/uL (ref 150–400)
RBC: 4.54 MIL/uL (ref 3.87–5.11)
RDW: 14.6 % (ref 11.5–15.5)
WBC: 7.4 10*3/uL (ref 4.0–10.5)

## 2013-01-10 LAB — LIPID PANEL
Cholesterol: 141 mg/dL (ref 0–200)
HDL: 39 mg/dL — ABNORMAL LOW (ref >39)
LDL Cholesterol: 71 mg/dL (ref 0–99)
Total CHOL/HDL Ratio: 3.6 Ratio
Triglycerides: 153 mg/dL — ABNORMAL HIGH (ref <150)
VLDL: 31 mg/dL (ref 0–40)

## 2013-01-10 NOTE — Progress Notes (Signed)
CMP,FLP,CBC DONE TODAY Tria Noguera

## 2013-01-13 ENCOUNTER — Encounter: Payer: Self-pay | Admitting: Family Medicine

## 2013-01-22 ENCOUNTER — Telehealth: Payer: Self-pay | Admitting: Family Medicine

## 2013-01-22 NOTE — Telephone Encounter (Signed)
Will pt need appointment or refill - please advise. Thanks! Wyatt Haste, RN-BSN

## 2013-01-22 NOTE — Telephone Encounter (Signed)
Pt is requesting a refill on medication that she has received before for a stye on her eye. JW

## 2013-01-22 NOTE — Telephone Encounter (Signed)
She should use warm compresses.  Yes, she would need an appointment for any prescription medication.  Usually, sties resolve without medication.

## 2013-02-06 ENCOUNTER — Other Ambulatory Visit: Payer: Self-pay | Admitting: Family Medicine

## 2013-03-06 ENCOUNTER — Ambulatory Visit (INDEPENDENT_AMBULATORY_CARE_PROVIDER_SITE_OTHER): Payer: Medicaid Other | Admitting: *Deleted

## 2013-03-06 DIAGNOSIS — Z23 Encounter for immunization: Secondary | ICD-10-CM

## 2013-04-01 ENCOUNTER — Ambulatory Visit: Payer: Medicaid Other

## 2013-04-01 ENCOUNTER — Encounter: Payer: Self-pay | Admitting: Family Medicine

## 2013-04-01 ENCOUNTER — Ambulatory Visit (INDEPENDENT_AMBULATORY_CARE_PROVIDER_SITE_OTHER): Payer: Medicaid Other | Admitting: Family Medicine

## 2013-04-01 VITALS — BP 154/83 | HR 85 | Temp 98.3°F | Ht 65.5 in | Wt >= 6400 oz

## 2013-04-01 DIAGNOSIS — K529 Noninfective gastroenteritis and colitis, unspecified: Secondary | ICD-10-CM | POA: Insufficient documentation

## 2013-04-01 DIAGNOSIS — K5289 Other specified noninfective gastroenteritis and colitis: Secondary | ICD-10-CM

## 2013-04-01 MED ORDER — ONDANSETRON 4 MG PO TBDP: 4.0000 mg | ORAL_TABLET | Freq: Three times a day (TID) | ORAL | Status: DC | PRN

## 2013-04-01 NOTE — Progress Notes (Signed)
  Subjective:    Patient ID: Kelsey Bullock, female    DOB: [birth year], 52 y.o.   MRN: [id number]  HPI Patient here 1-1/2 hours late for SDA, for acute onset diarrhea and crampy abdominal pain.  She is here with two sons, who have similar symptoms.  Regarding the diarrhea, Kelsey Bullock remarks that she has seen no blood or mucus.  Has not had a fever.  Has had decreased appetite but no emesis.  No recent travel or exotic eating.  Associated sxs include mild intermittent dizziness when she goes from laying to sitting, but this resolves rapidly.  Has had "vice grip" headache bitemporal with the diarrhea.   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    Review of Systems See above. Denies dysuria; breathing at her baseline.     Objective:   Physical Exam Generally well, morbidly obese, no acute distress. Mildly diaphoretic.  HEENT Moist mucus membranes. Clear TMs bilaterally. Clear oropharynx without exudates. Neck supple. No frontal or maxillary sinus tenderness, clear nasal mucosa bilaterally.  COR Regular S1S2 PULM Clear bilaterally,no rales or wheezes.  ABD Obese; audible bowel sounds. Nontender, no organomegaly appreciated. No guarding.  Specifically, no RUQ tenderness nor tenderness at McBurnie's point.        Assessment & Plan:

## 2013-04-01 NOTE — Patient Instructions (Addendum)
It was a pleasure to see you today.  I believe your symptoms are from a viral stomach bug that commonly causes diarrhea, and may also give nausea/vomiting.  I am sending a prescription for ondansetron 4mg  tablets for nausea/vomiting, take 1 tablet by mouth every 8 hours as needed for nausea/vomiting.   Small sips of clear liquids every 15 minutes for the rest of the day; you may advance your diet to bland solids (toast, rice without sauce or gravy, ripe bananas) as you tolerate.   IT IS VERY IMPORTANT TO WASH HANDS FREQUENTLY IN ORDER TO PREVENT SPREAD OF THIS HIGHLY CONTAGIOUS VIRAL INFECTION.  Please call back if you develop fever, inability to keep down fluids, if you begin with shortness of breath or a worsening of your asthma symptoms, or with other concerns.   Viral Gastroenteritis Viral gastroenteritis is also known as stomach flu. This condition affects the stomach and intestinal tract. It can cause sudden diarrhea and vomiting. The illness typically lasts 3 to 8 days. Most people develop an immune response that eventually gets rid of the virus. While this natural response develops, the virus can make you quite ill. CAUSES  Many different viruses can cause gastroenteritis, such as rotavirus or noroviruses. You can catch one of these viruses by consuming contaminated food or water. You may also catch a virus by sharing utensils or other personal items with an infected person or by touching a contaminated surface. SYMPTOMS  The most common symptoms are diarrhea and vomiting. These problems can cause a severe loss of body fluids (dehydration) and a body salt (electrolyte) imbalance. Other symptoms may include:  Fever.  Headache.  Fatigue.  Abdominal pain. DIAGNOSIS  Your caregiver can usually diagnose viral gastroenteritis based on your symptoms and a physical exam. A stool sample may also be taken to test for the presence of viruses or other infections. TREATMENT  This illness  typically goes away on its own. Treatments are aimed at rehydration. The most serious cases of viral gastroenteritis involve vomiting so severely that you are not able to keep fluids down. In these cases, fluids must be given through an intravenous line (IV). HOME CARE INSTRUCTIONS   Drink enough fluids to keep your urine clear or pale yellow. Drink small amounts of fluids frequently and increase the amounts as tolerated.  Ask your caregiver for specific rehydration instructions.  Avoid:  Foods high in sugar.  Alcohol.  Carbonated drinks.  Tobacco.  Juice.  Caffeine drinks.  Extremely hot or cold fluids.  Fatty, greasy foods.  Too much intake of anything at one time.  Dairy products until 24 to 48 hours after diarrhea stops.  You may consume probiotics. Probiotics are active cultures of beneficial bacteria. They may lessen the amount and number of diarrheal stools in adults. Probiotics can be found in yogurt with active cultures and in supplements.  Wash your hands well to avoid spreading the virus.  Only take over-the-counter or prescription medicines for pain, discomfort, or fever as directed by your caregiver. Do not give aspirin to children. Antidiarrheal medicines are not recommended.  Ask your caregiver if you should continue to take your regular prescribed and over-the-counter medicines.  Keep all follow-up appointments as directed by your caregiver. SEEK IMMEDIATE MEDICAL CARE IF:   You are unable to keep fluids down.  You do not urinate at least once every 6 to 8 hours.  You develop shortness of breath.  You notice blood in your stool or vomit. This may look  like coffee grounds.  You have abdominal pain that increases or is concentrated in one small area (localized).  You have persistent vomiting or diarrhea.  You have a fever.  The patient is a child younger than 3 months, and he or she has a fever.  The patient is a child older than 3 months, and he  or she has a fever and persistent symptoms.  The patient is a child older than 3 months, and he or she has a fever and symptoms suddenly get worse.  The patient is a baby, and he or she has no tears when crying. MAKE SURE YOU:   Understand these instructions.  Will watch your condition.  Will get help right away if you are not doing well or get worse. Document Released: 05/22/2005 Document Revised: 08/14/2011 Document Reviewed: 03/08/2011 [facility] Patient Information 2014 [place], [place].

## 2013-04-01 NOTE — Assessment & Plan Note (Signed)
Highly suspect viral gastroenteritis; patient and her 2 sons have similar symptoms and presentation/time courses.  Plan for supportive care; antiemetics for worsening nausea.  Slow steady approach to oral rehydration, indications for recheck or presentation for more urgent evaluation.  She voices understanding of this plan.

## 2013-04-07 ENCOUNTER — Telehealth: Payer: Self-pay | Admitting: Family Medicine

## 2013-04-07 MED ORDER — IBUPROFEN 800 MG PO TABS: ORAL_TABLET | ORAL | Status: DC

## 2013-04-07 NOTE — Telephone Encounter (Signed)
Pt would like refill on ibuphrophen 800mg . Pharmacy [facility] at [address] Please advise

## 2013-04-16 ENCOUNTER — Other Ambulatory Visit: Payer: Self-pay | Admitting: Family Medicine

## 2013-06-13 ENCOUNTER — Other Ambulatory Visit: Payer: Self-pay | Admitting: Family Medicine

## 2013-08-05 ENCOUNTER — Other Ambulatory Visit: Payer: Self-pay

## 2013-08-05 DIAGNOSIS — Z1231 Encounter for screening mammogram for malignant neoplasm of breast: Secondary | ICD-10-CM

## 2013-08-21 ENCOUNTER — Other Ambulatory Visit: Payer: Self-pay | Admitting: Family Medicine

## 2013-08-28 ENCOUNTER — Telehealth: Payer: Self-pay | Admitting: Family Medicine

## 2013-08-28 ENCOUNTER — Ambulatory Visit
Admission: RE | Admit: 2013-08-28 | Discharge: 2013-08-28 | Disposition: A | Discharge status: Home / Self Care | Payer: Medicaid Other | Source: Ambulatory Visit

## 2013-08-28 DIAGNOSIS — Z1231 Encounter for screening mammogram for malignant neoplasm of breast: Secondary | ICD-10-CM

## 2013-08-28 MED ORDER — FLUCONAZOLE 100 MG PO TABS: ORAL_TABLET | ORAL | Status: DC

## 2013-08-28 NOTE — Telephone Encounter (Signed)
Has cutaneous candidiasis under breasts.

## 2013-08-28 NOTE — Telephone Encounter (Signed)
Refill request for diflucan.

## 2013-10-12 ENCOUNTER — Other Ambulatory Visit: Payer: Self-pay | Admitting: Family Medicine

## 2013-10-14 ENCOUNTER — Other Ambulatory Visit: Payer: Self-pay | Admitting: Family Medicine

## 2013-11-26 ENCOUNTER — Encounter: Payer: Self-pay | Admitting: Family Medicine

## 2013-11-26 ENCOUNTER — Ambulatory Visit
Admission: RE | Admit: 2013-11-26 | Discharge: 2013-11-26 | Disposition: A | Discharge status: Home / Self Care | Payer: Medicaid Other | Source: Ambulatory Visit | Attending: Family Medicine | Admitting: Family Medicine

## 2013-11-26 ENCOUNTER — Ambulatory Visit (INDEPENDENT_AMBULATORY_CARE_PROVIDER_SITE_OTHER): Payer: Medicaid Other | Admitting: Family Medicine

## 2013-11-26 VITALS — BP 147/73 | HR 89 | Temp 97.8°F | Resp 24 | Wt >= 6400 oz

## 2013-11-26 DIAGNOSIS — S99919A Unspecified injury of unspecified ankle, initial encounter: Secondary | ICD-10-CM

## 2013-11-26 DIAGNOSIS — S8990XA Unspecified injury of unspecified lower leg, initial encounter: Secondary | ICD-10-CM

## 2013-11-26 DIAGNOSIS — S99921A Unspecified injury of right foot, initial encounter: Secondary | ICD-10-CM

## 2013-11-26 DIAGNOSIS — S99929A Unspecified injury of unspecified foot, initial encounter: Secondary | ICD-10-CM | POA: Insufficient documentation

## 2013-11-26 NOTE — Assessment & Plan Note (Signed)
Injury presentation most consistent w/ toe contusion.  Low likelihood for fracture but given pts comorbidities will get Xray to better asses Buddy tape now Pt to go in postop show if truly broken

## 2013-11-26 NOTE — Patient Instructions (Signed)
You have likely bruised your toes.  We will get an xray to make sure you haven't broken the toes Please keep on the buddy tape for relief as needed Please start taking 400-600mg  of ibuprofen as needed for pain Remember to rest, elevate and ice your toes as needed We will call you if the toes are broken as you will likely need a special shoe

## 2013-11-26 NOTE — Progress Notes (Signed)
Kelsey Bullock is a 53 y.o. female who presents to [facility] today for Foot pain  Foot pain: started 2 days ago. Showering at night and dropped shower head on toes. Struck the 2-3 toes. Didn't hurt at first. Became painful the following day and started to bruise. Movement and sensation intact. Pain is improving but is worse w/ movement or pressure. Pt is DM. Has not taken anything for pain  The following portions of the patient's history were reviewed and updated as appropriate: allergies, current medications, past medical history, family and social history, and problem list.  Patient is a nonsmoker.  No past medical history on file.  ROS as above otherwise neg.    Medications reviewed. Current Outpatient Prescriptions  Medication Sig Dispense Refill  . albuterol (PROVENTIL) (2.5 MG/3ML) 0.083% nebulizer solution Take 3 mLs (2.5 mg total) by nebulization every 4 (four) hours as needed for Wheezing.  25 vial  2  . aspirin 81 MG EC tablet Take 1 tablet (81 mg total) by mouth daily. Swallow whole.  30 tablet  12  . azithromycin (AZASITE) 1 % ophthalmic solution Place 1 drop into the left eye daily.  2.5 mL  0  . buPROPion (WELLBUTRIN SR) 100 MG 12 hr tablet Take 100 mg by mouth every morning. Per mental health        . calcium carbonate 1250 MG capsule Take 1,250 mg by mouth 2 (two) times daily with a meal. Taken as part of Ca-Mg complex; not sure of dosage.       . cetirizine (ZYRTEC) 10 MG tablet Take 1 tablet (10 mg total) by mouth daily.  30 tablet  1  . clonazePAM (KLONOPIN) 1 MG tablet Take 1 mg by mouth 2 (two) times daily.        . cyclobenzaprine (FLEXERIL) 10 MG tablet TAKE 1 TABLET BY MOUTH EVERY NIGHT AT BEDTIME AS NEEDED FOR BACK OR NECK PAIN OR SPASMS  30 tablet  12  . famotidine (PEPCID) 20 MG tablet TAKE 1 TABLET BY MOUTH DAILY  90 tablet  3  . fluconazole (DIFLUCAN) 100 MG tablet TAKE 1 TABLET BY MOUTH EVERY DAY.  14 tablet  3  . fluticasone (FLONASE) 50 MCG/ACT nasal spray USE 2  SPRAYS IN EACH NOSTRIL DAILY(USE EVEN WHEN NOSE STUFFY)  1 Act  12  . hydrochlorothiazide (HYDRODIURIL) 25 MG tablet Take 1 tablet (25 mg total) by mouth daily.  90 tablet  3  . ibuprofen (ADVIL,MOTRIN) 800 MG tablet TAKE 1 TABLET BY MOUTH THREE TIMES DAILY AS NEEDED FOR PAIN  90 tablet  12  . lisinopril (PRINIVIL,ZESTRIL) 40 MG tablet TAKE 1 TABLET BY MOUTH EVERY DAY  90 tablet  3  . mirtazapine (REMERON SOL-TAB) 30 MG disintegrating tablet Take 30 mg by mouth daily. Per mental health       . mometasone-formoterol (DULERA) 200-5 MCG/ACT AERO Inhale 2 puffs into the lungs 2 (two) times daily.      . montelukast (SINGULAIR) 10 MG tablet Take 1 tablet (10 mg total) by mouth at bedtime.  30 tablet  3  . norethindrone (AYGESTIN) 5 MG tablet Take 2.5 mg by mouth daily.       Marland Kitchen omalizumab (XOLAIR) 150 MG injection Inject into the skin every 28 (twenty-eight) days. Unsure of dosage.  Pt gets two injxns per visit monthly.       Marland Kitchen omeprazole (PRILOSEC) 40 MG capsule Take 40 mg by mouth daily.        . ondansetron (  ZOFRAN-ODT) 4 MG disintegrating tablet Take 1 tablet (4 mg total) by mouth every 8 (eight) hours as needed for nausea.  4 tablet  0  . PATADAY 0.2 % SOLN PLACE 1 DROP INTO EACH EYE DAILY  2.5 mL  0  . PATADAY 0.2 % SOLN PLACE 1 DROP INTO EACH EYE DAILY  2.5 mL  0  . PROVENTIL HFA 108 (90 BASE) MCG/ACT inhaler INHALE 2 PUFFS BY MOUTH EVERY 4 HOURS AS NEEDED FOR WHEEZING  6.7 g  12  . QVAR 80 MCG/ACT inhaler INHALE 2 PUFFS BY MOUTH TWICE DAILY  8.7 g  12  . simvastatin (ZOCOR) 80 MG tablet TAKE 1 TABLET BY MOUTH EVERY NIGHT AT BEDTIME  90 tablet  3  . Specialty Vitamins Products (MAGNESIUM, AMINO ACID CHELATE,) 133 MG tablet Take 1 tablet by mouth 2 (two) times daily. Taken as part of Ca-Mg complex.  Not sure of dosage.       . traZODone (DESYREL) 100 MG tablet Take 100 mg by mouth at bedtime.        . triamcinolone cream (KENALOG) 0.1 % APPLY TO THE AFFECTED AREA TWICE DAILY.  30 g  3   No  current facility-administered medications for this visit.    Exam:  BP 147/73  Pulse 89  Temp(Src) 97.8 F (36.6 C) (Oral)  Resp 24  Wt 420 lb (190.511 kg)  SpO2 95% Gen: Well NAD HEENT: EOMI,  MMM MSK: R 2-3toes ttp, FROM, no swelling but w/ slight discoloration  No results found for this or any previous visit (from the past 72 hour(s)).  A/P (as seen in Problem list)  Toe injury Injury presentation most consistent w/ toe contusion.  Low likelihood for fracture but given pts comorbidities will get Xray to better asses Buddy tape now Pt to go in postop show if truly broken

## 2013-11-27 ENCOUNTER — Telehealth: Payer: Self-pay | Admitting: *Deleted

## 2013-11-27 NOTE — Telephone Encounter (Signed)
Message copied by Valerie Roys on Thu Nov 27, 2013 10:20 AM ------      Message from: [facility], DAVID J      Created: Thu Nov 27, 2013  8:50 AM       No fracture on Xray.      Cont buddy tape and NSAIDs prn            ----- Message -----         From: Rad Results In Interface         Sent: 11/26/2013  12:49 PM           To: Waldemar Dickens, MD                   ------

## 2013-11-27 NOTE — Telephone Encounter (Signed)
LM for patient with exact message from MD.  Kelsey Bullock

## 2013-12-12 ENCOUNTER — Ambulatory Visit: Payer: Medicaid Other | Admitting: Family Medicine

## 2014-01-02 ENCOUNTER — Ambulatory Visit: Payer: Medicaid Other | Admitting: Family Medicine

## 2014-01-30 ENCOUNTER — Ambulatory Visit: Payer: Medicaid Other | Admitting: Family Medicine

## 2014-02-18 ENCOUNTER — Encounter: Payer: Self-pay | Admitting: Family Medicine

## 2014-02-18 ENCOUNTER — Ambulatory Visit (INDEPENDENT_AMBULATORY_CARE_PROVIDER_SITE_OTHER): Payer: Medicaid Other | Admitting: Family Medicine

## 2014-02-18 VITALS — BP 138/78 | HR 83 | Temp 98.7°F | Ht 65.5 in | Wt >= 6400 oz

## 2014-02-18 DIAGNOSIS — Z23 Encounter for immunization: Secondary | ICD-10-CM

## 2014-02-18 DIAGNOSIS — E7800 Pure hypercholesterolemia, unspecified: Secondary | ICD-10-CM

## 2014-02-18 DIAGNOSIS — E119 Type 2 diabetes mellitus without complications: Secondary | ICD-10-CM

## 2014-02-18 DIAGNOSIS — F329 Major depressive disorder, single episode, unspecified: Secondary | ICD-10-CM

## 2014-02-18 DIAGNOSIS — F32A Depression, unspecified: Secondary | ICD-10-CM

## 2014-02-18 DIAGNOSIS — F3289 Other specified depressive episodes: Secondary | ICD-10-CM

## 2014-02-18 DIAGNOSIS — I10 Essential (primary) hypertension: Secondary | ICD-10-CM

## 2014-02-18 LAB — LIPID PANEL
Cholesterol: 140 mg/dL (ref 0–200)
HDL: 44 mg/dL (ref >39)
LDL Cholesterol: 62 mg/dL (ref 0–99)
Total CHOL/HDL Ratio: 3.2 Ratio
Triglycerides: 168 mg/dL — ABNORMAL HIGH (ref <150)
VLDL: 34 mg/dL (ref 0–40)

## 2014-02-18 LAB — COMPREHENSIVE METABOLIC PANEL
ALT: 11 U/L (ref 0–35)
AST: 12 U/L (ref 0–37)
Albumin: 4.1 g/dL (ref 3.5–5.2)
Alkaline Phosphatase: 83 U/L (ref 39–117)
BUN: 8 mg/dL (ref 6–23)
CO2: 29 mEq/L (ref 19–32)
Calcium: 9.7 mg/dL (ref 8.4–10.5)
Chloride: 100 mEq/L (ref 96–112)
Creat: 0.57 mg/dL (ref 0.50–1.10)
Glucose, Bld: 114 mg/dL — ABNORMAL HIGH (ref 70–99)
Potassium: 4.1 mEq/L (ref 3.5–5.3)
Sodium: 139 mEq/L (ref 135–145)
Total Bilirubin: 0.5 mg/dL (ref 0.2–1.2)
Total Protein: 7.2 g/dL (ref 6.0–8.3)

## 2014-02-18 LAB — CBC
HCT: 41.8 % (ref 36.0–46.0)
Hemoglobin: 14.1 g/dL (ref 12.0–15.0)
MCH: 30 pg (ref 26.0–34.0)
MCHC: 33.7 g/dL (ref 30.0–36.0)
MCV: 88.9 fL (ref 78.0–100.0)
Platelets: 240 10*3/uL (ref 150–400)
RBC: 4.7 MIL/uL (ref 3.87–5.11)
RDW: 14.4 % (ref 11.5–15.5)
WBC: 7.7 10*3/uL (ref 4.0–10.5)

## 2014-02-18 LAB — POCT GLYCOSYLATED HEMOGLOBIN (HGB A1C): Hemoglobin A1C: 6.6

## 2014-02-18 MED ORDER — HYDROCHLOROTHIAZIDE 25 MG PO TABS: 25.0000 mg | ORAL_TABLET | Freq: Every day | ORAL | Status: DC

## 2014-02-18 NOTE — Patient Instructions (Addendum)
You need an eye exam because of the diabetes. You will get a flu shot today. Please have your doctors send me reports about your eye exam and your pap smear.   I am sorry life is so hard Start taking the hydrochlorothiazide and a baby aspirin every day.

## 2014-02-19 DIAGNOSIS — F339 Major depressive disorder, recurrent, unspecified: Secondary | ICD-10-CM | POA: Insufficient documentation

## 2014-02-19 NOTE — Progress Notes (Signed)
   Subjective:    Patient ID: Kelsey Bullock, female    DOB: [birth year], 53 y.o.   MRN: [id number]  HPI  Kelsey Bullock continues to have many different issues. Depression.  Single parent.  As kids enter teens/late teens they are becoming more willful and more difficult to manage.  For example, her 24 yo daughter has a "serious" 19 yo boyfriend that she met on line and now calls him her fiance.  No HI or SI.  Kelsey Bullock and all kids remain under care of psychiatrist.  Most recent stress is that the home is infested with bedbugs.  The landlord is blaming her and threatening to not renew the lease. Hypertension.  Borderline control.  She has not been taking her HCTZ.  She has difficulty staying on top of her medications/polypharmacy. High cholesterol.  Due for check Obesity, no progress DM "diet controled" although it would be a stretch to say she is really following diet and exercise.  Early DM with A1C at goal not on meds. HPDP - gets paps done elsewhere.  Needs eye appointment due to DM.  She has an eye doctor.    Review of Systems     Objective:   Physical ExamTeary in office. Lungs clear Cardiac RRR without m or g Abd benign Ext see diabetic foot exam        Assessment & Plan:

## 2014-02-19 NOTE — Assessment & Plan Note (Signed)
Central to most of her problems.  She has not been able to lose wt despite mult attempts and continuous reminders from me.

## 2014-02-19 NOTE — Assessment & Plan Note (Signed)
Decent control, still restart HCTZ

## 2014-02-19 NOTE — Assessment & Plan Note (Signed)
A1C at goal on no meds

## 2014-02-19 NOTE — Assessment & Plan Note (Signed)
Worse than usual with her teenage children and now bedbug infestation.  Recommend continued pysch follow up.

## 2014-06-25 ENCOUNTER — Other Ambulatory Visit: Payer: Self-pay | Admitting: Family Medicine

## 2014-07-02 ENCOUNTER — Ambulatory Visit (INDEPENDENT_AMBULATORY_CARE_PROVIDER_SITE_OTHER): Payer: Medicaid Other | Admitting: Family Medicine

## 2014-07-02 ENCOUNTER — Encounter: Payer: Self-pay | Admitting: Family Medicine

## 2014-07-02 VITALS — BP 142/79 | HR 80 | Temp 98.2°F | Ht 65.5 in | Wt >= 6400 oz

## 2014-07-02 DIAGNOSIS — R1084 Generalized abdominal pain: Secondary | ICD-10-CM

## 2014-07-02 NOTE — Patient Instructions (Signed)
Thank you for coming in,   Most likely you have a viral gastroenteritis.   Drinking plenty of fluids is the key. Your appetite will return when the virus has run its course.    Please feel free to call with any questions or concerns at any time, at [phone]. --Dr. Raeford Razor  Viral Gastroenteritis Viral gastroenteritis is also known as stomach flu. This condition affects the stomach and intestinal tract. It can cause sudden diarrhea and vomiting. The illness typically lasts 3 to 8 days. Most people develop an immune response that eventually gets rid of the virus. While this natural response develops, the virus can make you quite ill. CAUSES  Many different viruses can cause gastroenteritis, such as rotavirus or noroviruses. You can catch one of these viruses by consuming contaminated food or water. You may also catch a virus by sharing utensils or other personal items with an infected person or by touching a contaminated surface. SYMPTOMS  The most common symptoms are diarrhea and vomiting. These problems can cause a severe loss of body fluids (dehydration) and a body salt (electrolyte) imbalance. Other symptoms may include:  Fever.  Headache.  Fatigue.  Abdominal pain. DIAGNOSIS  Your caregiver can usually diagnose viral gastroenteritis based on your symptoms and a physical exam. A stool sample may also be taken to test for the presence of viruses or other infections. TREATMENT  This illness typically goes away on its own. Treatments are aimed at rehydration. The most serious cases of viral gastroenteritis involve vomiting so severely that you are not able to keep fluids down. In these cases, fluids must be given through an intravenous line (IV). HOME CARE INSTRUCTIONS   Drink enough fluids to keep your urine clear or pale yellow. Drink small amounts of fluids frequently and increase the amounts as tolerated.  Ask your caregiver for specific rehydration instructions.  Avoid:  Foods  high in sugar.  Alcohol.  Carbonated drinks.  Tobacco.  Juice.  Caffeine drinks.  Extremely hot or cold fluids.  Fatty, greasy foods.  Too much intake of anything at one time.  Dairy products until 24 to 48 hours after diarrhea stops.  You may consume probiotics. Probiotics are active cultures of beneficial bacteria. They may lessen the amount and number of diarrheal stools in adults. Probiotics can be found in yogurt with active cultures and in supplements.  Wash your hands well to avoid spreading the virus.  Only take over-the-counter or prescription medicines for pain, discomfort, or fever as directed by your caregiver. Do not give aspirin to children. Antidiarrheal medicines are not recommended.  Ask your caregiver if you should continue to take your regular prescribed and over-the-counter medicines.  Keep all follow-up appointments as directed by your caregiver. SEEK IMMEDIATE MEDICAL CARE IF:   You are unable to keep fluids down.  You do not urinate at least once every 6 to 8 hours.  You develop shortness of breath.  You notice blood in your stool or vomit. This may look like coffee grounds.  You have abdominal pain that increases or is concentrated in one small area (localized).  You have persistent vomiting or diarrhea.  You have a fever.  The patient is a child younger than 3 months, and he or she has a fever.  The patient is a child older than 3 months, and he or she has a fever and persistent symptoms.  The patient is a child older than 3 months, and he or she has a fever and symptoms  suddenly get worse.  The patient is a baby, and he or she has no tears when crying. MAKE SURE YOU:   Understand these instructions.  Will watch your condition.  Will get help right away if you are not doing well or get worse. Document Released: 05/22/2005 Document Revised: 08/14/2011 Document Reviewed: 03/08/2011 [facility] Patient Information 2015 [place], [place].  This information is not intended to replace advice given to you by your health care provider. Make sure you discuss any questions you have with your health care provider.

## 2014-07-03 DIAGNOSIS — R109 Unspecified abdominal pain: Secondary | ICD-10-CM | POA: Insufficient documentation

## 2014-07-03 NOTE — Progress Notes (Signed)
    Subjective   Kelsey Bullock is a 54 y.o. female that presents for a same day visit  Stomach/abdominal pain   Problem began 2-3 days ago Progression: staying the same since she hasn't eaten anything today.  Medications tried: none Anything improved it: not eating  Anything worsen it: eating. She has diarrhea soon after eating  Had similar problem before: yes  The pain feels crampy and generalized. She has had sick contacts with similar symptoms. She hasn't tried new foods or spoiled foods. She has a headache, chills, night sweats and nausea. Denies fever, vomiting or dysuria.   History  Substance Use Topics  . Smoking status: Never Smoker   . Smokeless tobacco: Never Used  . Alcohol Use: No    ROS Per HPI  Objective   BP 142/79 mmHg  Pulse 80  Temp(Src) 98.2 F (36.8 C) (Oral)  Ht 5' 5.5" (1.664 m)  Wt 402 lb 4.8 oz (182.482 kg)  BMI 65.90 kg/m2  General: Well appearing, NAD, alert  Respiratory/Chest: CTAB Cardiovascular: RRR, S1S2 Gastrointestinal: protuberant, NTND, +BS, exam limited due to panus    Assessment and Plan   Please refer to problem based charting of assessment and plan

## 2014-07-03 NOTE — Assessment & Plan Note (Signed)
Most likely viral gastroenteritis. No rebound or guarding.  - stay hydrated, appetite will return when virus has run course  - supportive care  - f/u in 7-10 if symptoms persists or worsen

## 2014-07-08 ENCOUNTER — Other Ambulatory Visit: Payer: Self-pay | Admitting: Family Medicine

## 2014-09-10 ENCOUNTER — Other Ambulatory Visit: Payer: Self-pay | Admitting: Family Medicine

## 2014-09-22 ENCOUNTER — Encounter: Payer: Self-pay | Admitting: Family Medicine

## 2014-09-22 ENCOUNTER — Ambulatory Visit (INDEPENDENT_AMBULATORY_CARE_PROVIDER_SITE_OTHER): Payer: Medicaid Other | Admitting: Family Medicine

## 2014-09-22 VITALS — BP 138/92 | HR 88 | Temp 98.4°F | Ht 65.5 in | Wt >= 6400 oz

## 2014-09-22 DIAGNOSIS — B351 Tinea unguium: Secondary | ICD-10-CM | POA: Diagnosis not present

## 2014-09-22 DIAGNOSIS — B360 Pityriasis versicolor: Secondary | ICD-10-CM | POA: Diagnosis present

## 2014-09-22 DIAGNOSIS — Z79899 Other long term (current) drug therapy: Secondary | ICD-10-CM | POA: Diagnosis not present

## 2014-09-22 LAB — COMPREHENSIVE METABOLIC PANEL
ALT: 11 U/L (ref 0–35)
AST: 13 U/L (ref 0–37)
Albumin: 3.9 g/dL (ref 3.5–5.2)
Alkaline Phosphatase: 90 U/L (ref 39–117)
BUN: 13 mg/dL (ref 6–23)
CO2: 24 mEq/L (ref 19–32)
Calcium: 9 mg/dL (ref 8.4–10.5)
Chloride: 102 mEq/L (ref 96–112)
Creat: 0.56 mg/dL (ref 0.50–1.10)
Glucose, Bld: 97 mg/dL (ref 70–99)
Potassium: 4.3 mEq/L (ref 3.5–5.3)
Sodium: 140 mEq/L (ref 135–145)
Total Bilirubin: 0.5 mg/dL (ref 0.2–1.2)
Total Protein: 6.8 g/dL (ref 6.0–8.3)

## 2014-09-22 MED ORDER — TERBINAFINE HCL 250 MG PO TABS: 250.0000 mg | ORAL_TABLET | Freq: Every day | ORAL | Status: DC

## 2014-09-22 NOTE — Progress Notes (Signed)
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   

## 2014-09-22 NOTE — Assessment & Plan Note (Signed)
Not a bruise on exam as stated in chief complaint, more consistent with tinea rash and in typical spot under skin folds. Has concurrent onychomycosis so will be treating with terbinafine. Discussed antifungal powders to keep affected areas clean/dry. F/u as needed.

## 2014-09-22 NOTE — Progress Notes (Signed)
   Subjective:    Patient ID: Kelsey Bullock, female    DOB: [birth year], 54 y.o.   MRN: [id number]  HPI  Patient presents for Same Day Appointment  CC: "bruising"  # Skin rash:  First noted a few days ago  Under right breast, also says she has some under skin folds on left side of her trunk and groin  Not painful, not itchy ROS: no  Review of Systems   See HPI for ROS. All other systems reviewed and are negative.  Past medical history, surgical, family, and social history reviewed and updated in the EMR as appropriate.  Objective:  BP 138/92 mmHg  Pulse 88  Temp(Src) 98.4 F (36.9 C) (Oral)  Ht 5' 5.5" (1.664 m)  Wt 409 lb 14.4 oz (185.929 kg)  BMI 67.15 kg/m2 Vitals and nursing note reviewed  General: NAD Ext: multiple toenails thick and discolored consistent with onychomycosis Skin: erythematous/irritated skin folds under right breast consistent with tinea infection  Assessment & Plan:  See Problem List Documentation

## 2014-09-22 NOTE — Assessment & Plan Note (Signed)
Long term problem but has not gotten treated for this in the past. Pt elects to treat with terbafine x 3 months (discussed may need to prolong treatment). Check CMP today and repeat in 6 weeks (future order placed). F/u 3 months.

## 2014-09-22 NOTE — Patient Instructions (Addendum)
I think you are having a skin fungal infection. Fungus/yeast are normally found on the skin, but like to grow in hot, moist, dark environments.   Keep the skin dry using antifungal powders, can also try over the counter antifungal creams as well if the terbinafine is not helping.  The terbinafine will help with both the skin and toenail infections. Need to check your liver function by blood test today and 6 weeks from now.

## 2014-10-21 ENCOUNTER — Ambulatory Visit (INDEPENDENT_AMBULATORY_CARE_PROVIDER_SITE_OTHER): Payer: Medicaid Other | Admitting: Family Medicine

## 2014-10-21 VITALS — BP 141/75 | HR 96 | Temp 97.7°F | Ht 65.5 in | Wt >= 6400 oz

## 2014-10-21 DIAGNOSIS — J45901 Unspecified asthma with (acute) exacerbation: Secondary | ICD-10-CM

## 2014-10-21 DIAGNOSIS — L081 Erythrasma: Secondary | ICD-10-CM

## 2014-10-21 DIAGNOSIS — L304 Erythema intertrigo: Secondary | ICD-10-CM

## 2014-10-21 DIAGNOSIS — J020 Streptococcal pharyngitis: Secondary | ICD-10-CM

## 2014-10-21 DIAGNOSIS — J069 Acute upper respiratory infection, unspecified: Secondary | ICD-10-CM | POA: Diagnosis not present

## 2014-10-21 MED ORDER — AZITHROMYCIN 250 MG PO TABS: ORAL_TABLET | ORAL | Status: DC

## 2014-10-21 MED ORDER — ALBUTEROL SULFATE HFA 108 (90 BASE) MCG/ACT IN AERS: INHALATION_SPRAY | RESPIRATORY_TRACT | Status: DC

## 2014-10-21 MED ORDER — ERYTHROMYCIN 2 % EX OINT: 1.0000 "application " | TOPICAL_OINTMENT | Freq: Two times a day (BID) | CUTANEOUS | Status: DC

## 2014-10-21 MED ORDER — NYSTATIN 100000 UNIT/GM EX POWD: Freq: Four times a day (QID) | CUTANEOUS | Status: DC

## 2014-10-21 NOTE — Patient Instructions (Signed)
For your symptoms, take azithromycin for 5 days. If you're not feeling significantly better in 1 week, follow up. Seek immediate care if Trouble breathing. Take albuterol every 4 hours for the next 1-2 days and then every 4 hours as needed. Continue your other asthma medications.  For your rash, I have prescribed nystatin powder for the one that is under your abdomen and erythromycin ointment for the one that is under your breast. Follow-up again in 1 week if this is not significantly improved or with fevers.

## 2014-10-21 NOTE — Progress Notes (Signed)
Patient ID: Kelsey Bullock, female   DOB: 1960/12/21, 54 y.o.   MRN: [id number] Subjective:   CC: Sore throat  HPI:   Sore throat Patient presents to sameday clinic for 3 weeks of sore throat, subjective fevers/chills, nasal and chest congestion, coughing white sputum, and some chest pain with breathing. Both her sons are sick and she drank after one of her sons who has diagnosed strep pharyngitis. She feels her asthma is poorly controlled with all of this but much improved breathing after getting steroid injection at Urgent Care recently. She denies neck stiffness or other concerns. Uses her asthma medications regularly. Denies tobacco use. Drinking normally and urinating well.   Right breast complaint Also complains of prior yeast infection under right breast that has improved but is persistently reddish and itchy.   Review of Systems - Per HPI.   PMH - type 2 diabetes, tinea versicolor, sleep apnea, pseudotumor cerebri, osteoarthritis, morbid obesity, low back pain, hypertension, hyperlipidemia, fibromyalgia, depression, persistent moderate asthma, anxiety, abdominal pain Smoking status: nonsmoker    Objective:  Physical Exam BP 141/75 mmHg  Pulse 96  Temp(Src) 97.7 F (36.5 C) (Oral)  Ht 5' 5.5" (1.664 m)  Wt 404 lb 12.8 oz (183.616 kg)  BMI 66.31 kg/m2 GEN: NAD CV: RRR, no m/r/g PULM: CTAB with normal effort and no wheezes or crackles EXTR: No LE edema or calf tenderness or asymmetry; morbid obesity ABD: S/NT/ND HEENT: AT/[place], sclera clear, EOMI, o/p mildly erythematous with no purulence or asymmetry, TMs erythematous and dulled bilaterally but not bulging, neck supple, no LAD, MMM, tender frontal sinus SKIN: Macerated erythematous skin under right breast with a mild scaling appearance and well-defined borders, no satellite lesions, not indurated; right nipple with small 1/2 cm on areaola, no purulence    Assessment:     Kelsey Bullock is a 54 y.o. female here for sore  throat and with rash under breast.    Plan:     # See problem list and after visit summary for problem-specific plans.   # Health Maintenance: Not discussed  Follow-up: Follow up in 1 week if sore throat or skin symptoms not improving.    Hilton Sinclair, MD [place]

## 2014-10-22 ENCOUNTER — Encounter: Payer: Self-pay | Admitting: Family Medicine

## 2014-10-22 DIAGNOSIS — L304 Erythema intertrigo: Secondary | ICD-10-CM | POA: Insufficient documentation

## 2014-10-22 DIAGNOSIS — B9789 Other viral agents as the cause of diseases classified elsewhere: Secondary | ICD-10-CM

## 2014-10-22 DIAGNOSIS — J069 Acute upper respiratory infection, unspecified: Secondary | ICD-10-CM | POA: Insufficient documentation

## 2014-10-22 NOTE — Assessment & Plan Note (Signed)
Exam consistent with erythrasma under right breast with small ulcer on nipple. -Erythromycin ointment for this area. Nystatin powder for reported area under pannus that is "moist" per pt. -F/u 1 week if not improved significantly or sooner if worsening.

## 2014-10-22 NOTE — Assessment & Plan Note (Addendum)
3 weeks of sore throat and sinus tenderness, with exposure to strep + son. -azithromycin 5 day course in PCN allergic (anaphylaxis) pt. -albuterol (refilled) q4 x 1-2 days, then q4h PRN. Continue other chronic asthma medications. Recent tx with steroids per pt. -f/u 1 week if not significant improvement. -Return precautions including dyspnea reviewed.

## 2014-10-23 NOTE — Progress Notes (Signed)
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   

## 2014-10-23 NOTE — Progress Notes (Signed)
I was the preceptor for this visit. 

## 2014-10-30 ENCOUNTER — Ambulatory Visit (INDEPENDENT_AMBULATORY_CARE_PROVIDER_SITE_OTHER): Payer: Medicaid Other | Admitting: Family Medicine

## 2014-10-30 ENCOUNTER — Encounter: Payer: Self-pay | Admitting: Family Medicine

## 2014-10-30 VITALS — BP 117/70 | HR 85 | Temp 98.2°F | Ht 65.5 in | Wt >= 6400 oz

## 2014-10-30 DIAGNOSIS — E1169 Type 2 diabetes mellitus with other specified complication: Secondary | ICD-10-CM

## 2014-10-30 DIAGNOSIS — I10 Essential (primary) hypertension: Secondary | ICD-10-CM

## 2014-10-30 DIAGNOSIS — Z114 Encounter for screening for human immunodeficiency virus [HIV]: Secondary | ICD-10-CM | POA: Diagnosis not present

## 2014-10-30 DIAGNOSIS — B351 Tinea unguium: Secondary | ICD-10-CM | POA: Diagnosis not present

## 2014-10-30 DIAGNOSIS — E669 Obesity, unspecified: Secondary | ICD-10-CM | POA: Diagnosis not present

## 2014-10-30 DIAGNOSIS — F329 Major depressive disorder, single episode, unspecified: Secondary | ICD-10-CM | POA: Diagnosis not present

## 2014-10-30 DIAGNOSIS — F32A Depression, unspecified: Secondary | ICD-10-CM

## 2014-10-30 DIAGNOSIS — E119 Type 2 diabetes mellitus without complications: Secondary | ICD-10-CM | POA: Diagnosis present

## 2014-10-30 LAB — COMPREHENSIVE METABOLIC PANEL
ALT: 9 U/L (ref 0–35)
AST: 9 U/L (ref 0–37)
Albumin: 3.9 g/dL (ref 3.5–5.2)
Alkaline Phosphatase: 87 U/L (ref 39–117)
BUN: 11 mg/dL (ref 6–23)
CO2: 27 mEq/L (ref 19–32)
Calcium: 8.8 mg/dL (ref 8.4–10.5)
Chloride: 103 mEq/L (ref 96–112)
Creat: 0.61 mg/dL (ref 0.50–1.10)
Glucose, Bld: 108 mg/dL — ABNORMAL HIGH (ref 70–99)
Potassium: 4.4 mEq/L (ref 3.5–5.3)
Sodium: 141 mEq/L (ref 135–145)
Total Bilirubin: 0.6 mg/dL (ref 0.2–1.2)
Total Protein: 6.7 g/dL (ref 6.0–8.3)

## 2014-10-30 LAB — POCT GLYCOSYLATED HEMOGLOBIN (HGB A1C): Hemoglobin A1C: 6.7

## 2014-10-30 MED ORDER — TRIAMCINOLONE ACETONIDE 0.1 % EX CREA: TOPICAL_CREAM | CUTANEOUS | Status: DC

## 2014-10-30 NOTE — Progress Notes (Signed)
   Subjective:    Patient ID: Kelsey Bullock, female    DOB: March 29, 1961, 54 y.o.   MRN: [id number]  HPI Kelsey Bullock is in her usual state of poor health and worse than usual poor social situation.  She is on disability, as are her children.  She has been doing OK and been in an [place] with Section 8 subsidy.  Her oldest daughter is now employed and over 35.  The unintended consequence is that patient now no longer qualifies for section 8 housing and has to move in the next 6 weeks.  This is overwhelming to her.  No SI or HI She continues to see a counselor and psychiatrist prescribes her mental health meds. Today, other than depression: Diet controled diabetic, needs AIC On lamisil, needs recheck LFTs Also due for 1 time AIC screen    Review of Systems     Objective:   Physical Exam Lungs clear Diabetic foot exam normal - nails responding to lamisil. Morbid obesity.        Assessment & Plan:

## 2014-10-30 NOTE — Assessment & Plan Note (Signed)
Once again urged wt loss

## 2014-10-30 NOTE — Assessment & Plan Note (Signed)
Good control.  Check CMP for renal function and for LFT on lamisil

## 2014-10-30 NOTE — Assessment & Plan Note (Addendum)
Needs check - A1C OK: remains diet controled.

## 2014-10-30 NOTE — Patient Instructions (Signed)
I am sorry about your housing problems.  Keep working on it. I sent in a new prescription for your triamcinolone. I will call Tuesday with the lab results. Keep working with Higher education careers adviser.

## 2014-10-30 NOTE — Assessment & Plan Note (Signed)
Responding to lamisil - check lfts

## 2014-10-30 NOTE — Assessment & Plan Note (Signed)
Chronic low functioning.  Most recent financial/housing stress is worsening her poor baseline state.  Urged FU with her mental health provider.

## 2014-10-31 LAB — HIV ANTIBODY (ROUTINE TESTING W REFLEX): HIV 1&2 Ab, 4th Generation: NONREACTIVE

## 2014-11-07 ENCOUNTER — Other Ambulatory Visit: Payer: Self-pay | Admitting: Family Medicine

## 2014-12-22 ENCOUNTER — Other Ambulatory Visit: Payer: Self-pay | Admitting: Family Medicine

## 2014-12-23 NOTE — Telephone Encounter (Signed)
Patient frequently requests fluconizole refills.  This should not be a chronic medication.

## 2015-01-15 ENCOUNTER — Encounter: Payer: Self-pay | Admitting: Family Medicine

## 2015-01-15 ENCOUNTER — Ambulatory Visit (INDEPENDENT_AMBULATORY_CARE_PROVIDER_SITE_OTHER): Payer: Medicaid Other | Admitting: Family Medicine

## 2015-01-15 VITALS — BP 148/65 | HR 77 | Temp 98.3°F | Ht 65.5 in | Wt >= 6400 oz

## 2015-01-15 DIAGNOSIS — L304 Erythema intertrigo: Secondary | ICD-10-CM | POA: Diagnosis not present

## 2015-01-15 DIAGNOSIS — F32A Depression, unspecified: Secondary | ICD-10-CM

## 2015-01-15 DIAGNOSIS — F329 Major depressive disorder, single episode, unspecified: Secondary | ICD-10-CM

## 2015-01-15 MED ORDER — NYSTATIN 100000 UNIT/GM EX POWD: Freq: Three times a day (TID) | CUTANEOUS | Status: DC

## 2015-01-15 NOTE — Assessment & Plan Note (Signed)
Extensive counseling.

## 2015-01-15 NOTE — Assessment & Plan Note (Signed)
Mild on exam today.  I will not refill diflucan - at risk for hepatic problems.  Will prescribe topical nystatin to use only when inflamed.  Of course, weight is the big issue causing the intertrigo under breast and panus.

## 2015-01-15 NOTE — Progress Notes (Signed)
   Subjective:    Patient ID: Kelsey Bullock, female    DOB: Nov 17, 1960, 54 y.o.   MRN: [id number]  HPI Kelsey Bullock comes in because I would not refill her diflucan, which she has been taking frequently for intertrigo.  Quite difficult now in the warm weather - and her weight is at an all time high.    Majority of visit focused on weight counseling.  She is not exercising and overeating.  A big problem is that she drinks lots of sodas and sweet tea.    Stress level is quite high.  Now in a motel as she searchs for new subsidized housing for her family.    Review of Systems     Objective:   Physical ExamIntertrigo is mild.   Wt is new high for her.        Assessment & Plan:

## 2015-01-15 NOTE — Patient Instructions (Signed)
I will see you in October. Quit buying juice and soda. Use the antifungal powder only when the skin is red and inflamed. Weight is the problem.  Do something about it. Exercise more - far away parking spots.

## 2015-01-15 NOTE — Assessment & Plan Note (Signed)
Worse with current life stressors.

## 2015-01-20 LAB — HM DIABETES EYE EXAM

## 2015-01-25 ENCOUNTER — Other Ambulatory Visit: Payer: Self-pay | Admitting: Family Medicine

## 2015-01-25 MED ORDER — LISINOPRIL 40 MG PO TABS: 40.0000 mg | ORAL_TABLET | Freq: Every day | ORAL | Status: DC

## 2015-01-25 NOTE — Telephone Encounter (Signed)
Pt calling and needs a refill on lisinopril sent to [facility] on [address]. Currently out of med.  Sadie Reynolds, ASA

## 2015-02-06 DIAGNOSIS — J4550 Severe persistent asthma, uncomplicated: Secondary | ICD-10-CM | POA: Insufficient documentation

## 2015-02-06 DIAGNOSIS — J309 Allergic rhinitis, unspecified: Secondary | ICD-10-CM | POA: Insufficient documentation

## 2015-02-06 DIAGNOSIS — K219 Gastro-esophageal reflux disease without esophagitis: Secondary | ICD-10-CM | POA: Insufficient documentation

## 2015-02-06 DIAGNOSIS — J310 Chronic rhinitis: Secondary | ICD-10-CM | POA: Insufficient documentation

## 2015-02-06 DIAGNOSIS — H1010 Acute atopic conjunctivitis, unspecified eye: Secondary | ICD-10-CM

## 2015-02-12 ENCOUNTER — Other Ambulatory Visit: Payer: Self-pay

## 2015-02-12 MED ORDER — OMALIZUMAB 150 MG ~~LOC~~ SOLR
300.0000 mg | SUBCUTANEOUS | Status: DC
  Administered 2015-03-03 – 2015-12-08 (×11): 300 mg via SUBCUTANEOUS

## 2015-02-15 ENCOUNTER — Other Ambulatory Visit: Payer: Self-pay | Admitting: *Deleted

## 2015-02-15 DIAGNOSIS — B351 Tinea unguium: Secondary | ICD-10-CM

## 2015-02-19 ENCOUNTER — Telehealth: Payer: Self-pay | Admitting: Family Medicine

## 2015-02-19 NOTE — Telephone Encounter (Signed)
Call Kelsey Bullock to discuss why the refill for Lamisil was refused.

## 2015-02-19 NOTE — Telephone Encounter (Signed)
Called and left message.  She has completed her course of treatment.  Cannot continue due to liver disease risk.

## 2015-03-03 ENCOUNTER — Ambulatory Visit (INDEPENDENT_AMBULATORY_CARE_PROVIDER_SITE_OTHER): Payer: Medicaid Other | Admitting: *Deleted

## 2015-03-03 DIAGNOSIS — J4550 Severe persistent asthma, uncomplicated: Secondary | ICD-10-CM | POA: Diagnosis not present

## 2015-03-10 ENCOUNTER — Ambulatory Visit (INDEPENDENT_AMBULATORY_CARE_PROVIDER_SITE_OTHER): Payer: Medicaid Other | Admitting: *Deleted

## 2015-03-10 DIAGNOSIS — Z23 Encounter for immunization: Secondary | ICD-10-CM | POA: Diagnosis not present

## 2015-03-31 ENCOUNTER — Ambulatory Visit (INDEPENDENT_AMBULATORY_CARE_PROVIDER_SITE_OTHER): Payer: Medicaid Other | Admitting: Neurology

## 2015-03-31 DIAGNOSIS — J4540 Moderate persistent asthma, uncomplicated: Secondary | ICD-10-CM

## 2015-03-31 DIAGNOSIS — J45901 Unspecified asthma with (acute) exacerbation: Secondary | ICD-10-CM

## 2015-04-13 ENCOUNTER — Encounter: Payer: Self-pay | Admitting: Family Medicine

## 2015-04-27 ENCOUNTER — Ambulatory Visit (INDEPENDENT_AMBULATORY_CARE_PROVIDER_SITE_OTHER): Payer: Medicaid Other | Admitting: *Deleted

## 2015-04-27 ENCOUNTER — Other Ambulatory Visit: Payer: Self-pay

## 2015-04-27 DIAGNOSIS — Z1231 Encounter for screening mammogram for malignant neoplasm of breast: Secondary | ICD-10-CM

## 2015-04-27 DIAGNOSIS — J4540 Moderate persistent asthma, uncomplicated: Secondary | ICD-10-CM | POA: Diagnosis not present

## 2015-04-28 ENCOUNTER — Other Ambulatory Visit: Payer: Self-pay | Admitting: Allergy and Immunology

## 2015-05-19 ENCOUNTER — Ambulatory Visit
Admission: RE | Admit: 2015-05-19 | Discharge: 2015-05-19 | Disposition: A | Discharge status: Home / Self Care | Payer: Medicaid Other | Source: Ambulatory Visit

## 2015-05-19 DIAGNOSIS — Z1231 Encounter for screening mammogram for malignant neoplasm of breast: Secondary | ICD-10-CM

## 2015-05-26 ENCOUNTER — Ambulatory Visit (INDEPENDENT_AMBULATORY_CARE_PROVIDER_SITE_OTHER): Payer: Medicaid Other

## 2015-05-26 DIAGNOSIS — J4540 Moderate persistent asthma, uncomplicated: Secondary | ICD-10-CM

## 2015-06-11 ENCOUNTER — Other Ambulatory Visit: Payer: Self-pay

## 2015-06-11 MED ORDER — MOMETASONE FURO-FORMOTEROL FUM 200-5 MCG/ACT IN AERO: 2.0000 | INHALATION_SPRAY | Freq: Two times a day (BID) | RESPIRATORY_TRACT | Status: DC

## 2015-06-15 ENCOUNTER — Other Ambulatory Visit: Payer: Self-pay | Admitting: Allergy

## 2015-06-15 MED ORDER — MOMETASONE FURO-FORMOTEROL FUM 200-5 MCG/ACT IN AERO: 2.0000 | INHALATION_SPRAY | Freq: Two times a day (BID) | RESPIRATORY_TRACT | Status: DC

## 2015-06-21 ENCOUNTER — Ambulatory Visit (INDEPENDENT_AMBULATORY_CARE_PROVIDER_SITE_OTHER): Payer: Medicaid Other

## 2015-06-21 DIAGNOSIS — J4540 Moderate persistent asthma, uncomplicated: Secondary | ICD-10-CM | POA: Diagnosis not present

## 2015-06-29 ENCOUNTER — Encounter: Payer: Self-pay | Admitting: Allergy and Immunology

## 2015-06-29 ENCOUNTER — Ambulatory Visit (INDEPENDENT_AMBULATORY_CARE_PROVIDER_SITE_OTHER): Payer: Medicaid Other | Admitting: Allergy and Immunology

## 2015-06-29 VITALS — HR 96 | Resp 22

## 2015-06-29 DIAGNOSIS — J309 Allergic rhinitis, unspecified: Secondary | ICD-10-CM

## 2015-06-29 DIAGNOSIS — J4551 Severe persistent asthma with (acute) exacerbation: Secondary | ICD-10-CM | POA: Diagnosis not present

## 2015-06-29 DIAGNOSIS — H1010 Acute atopic conjunctivitis, unspecified eye: Secondary | ICD-10-CM

## 2015-06-29 DIAGNOSIS — K219 Gastro-esophageal reflux disease without esophagitis: Secondary | ICD-10-CM | POA: Diagnosis not present

## 2015-06-29 MED ORDER — METHYLPREDNISOLONE ACETATE 80 MG/ML IJ SUSP
80.0000 mg | Freq: Once | INTRAMUSCULAR | Status: AC
  Administered 2015-06-29: 80 mg via INTRAMUSCULAR

## 2015-06-29 MED ORDER — AZITHROMYCIN 500 MG PO TABS: ORAL_TABLET | ORAL | Status: DC

## 2015-06-29 NOTE — Progress Notes (Signed)
[facility]  Follow-up Note  Referring Provider: [name], MD Primary Provider: Zigmund Gottron, MD Date of Office Visit: 06/29/2015  Subjective:   Kelsey Bullock is a 55 y.o. female who returns to the [facility] on 06/29/2015 in re-evaluation of the following:  HPI Comments: Kelsey Bullock presents this clinic in reevaluation of her asthma and rhinitis treated with Xolair. She has severe persistent asthma that has been marginally controlled on a large collection medical therapy including a combination of Dulera and Qvar and montelukast as well as Xolair. Overall she's done well for the past 5 months until approximately 7-10 days ago at which time she developed sore throat and nasal congestion and coughing and wheezing and complete anosmia and now has ugly nasal discharge and is using a bronchodilator extensively. She is apparently not had any high fever or ugly sputum production. Her chest has been hurting when she coughs in her sternal region. She has not been having any issues with reflux.   Current Outpatient Prescriptions on File Prior to Visit  Medication Sig Dispense Refill  . albuterol (PROVENTIL HFA) 108 (90 BASE) MCG/ACT inhaler INHALE 2 PUFFS BY MOUTH EVERY 4 HOURS AS NEEDED FOR WHEEZING 6.7 g 0  . albuterol (PROVENTIL) (2.5 MG/3ML) 0.083% nebulizer solution USE 1 UNIT IN NEBULIZER EVERY 4 TO 6 HOURS AS NEEDED FOR COUGH OR WHEEZE. 675 mL 0  . beclomethasone (QVAR) 80 MCG/ACT inhaler Inhale 2 puffs into the lungs daily.    Marland Kitchen buPROPion (WELLBUTRIN SR) 100 MG 12 hr tablet Take 100 mg by mouth every morning. Per mental health      . clonazePAM (KLONOPIN) 1 MG tablet Take 1 mg by mouth 2 (two) times daily.      . cyclobenzaprine (FLEXERIL) 10 MG tablet TAKE 1 TABLET BY MOUTH AT BEDTIME AS NEEDED FOR BACK OR NECK PAIN OR SPAMS 30 tablet 3  . EPINEPHrine (EPIPEN 2-PAK) 0.3 mg/0.3 mL IJ SOAJ injection Inject  0.3 mg into the muscle once.    . famotidine (PEPCID) 20 MG tablet TAKE 1 TABLET BY MOUTH EVERY DAY 90 tablet 3  . FLUoxetine (PROZAC) 10 MG capsule Take 10 mg by mouth daily.    . fluticasone (FLONASE) 50 MCG/ACT nasal spray USE 2 SPRAYS IN EACH NOSTRIL DAILY(USE EVEN WHEN NOSE STUFFY) 1 Act 12  . hydrochlorothiazide (HYDRODIURIL) 25 MG tablet Take 1 tablet (25 mg total) by mouth daily. 90 tablet 3  . ibuprofen (ADVIL,MOTRIN) 800 MG tablet TAKE 1 TABLET BY MOUTH THREE TIMES DAILY AS NEEDED FOR PAIN 90 tablet 12  . lisinopril (PRINIVIL,ZESTRIL) 40 MG tablet Take 1 tablet (40 mg total) by mouth daily. 90 tablet 3  . mirtazapine (REMERON SOL-TAB) 30 MG disintegrating tablet Take 30 mg by mouth daily. Per mental health     . mometasone-formoterol (DULERA) 200-5 MCG/ACT AERO Inhale 2 puffs into the lungs 2 (two) times daily. 1 Inhaler 5  . montelukast (SINGULAIR) 10 MG tablet Take 1 tablet (10 mg total) by mouth at bedtime. 30 tablet 3  . norethindrone (AYGESTIN) 5 MG tablet Take 2.5 mg by mouth daily.     Marland Kitchen nystatin (MYCOSTATIN) powder Apply topically 3 (three) times daily. 60 g 12  . omeprazole (PRILOSEC) 40 MG capsule Take 40 mg by mouth daily.      . ondansetron (ZOFRAN-ODT) 4 MG disintegrating tablet Take 1 tablet (4 mg total) by mouth every 8 (eight) hours as needed for  nausea. 4 tablet 0  . PATADAY 0.2 % SOLN PLACE 1 DROP INTO EACH EYE DAILY 2.5 mL 0  . simvastatin (ZOCOR) 80 MG tablet TAKE 1 TABLET BY MOUTH EVERY NIGHT AT BEDTIME 90 tablet 3  . traZODone (DESYREL) 100 MG tablet Take 100 mg by mouth at bedtime.      . triamcinolone cream (KENALOG) 0.1 % APPLY TO THE AFFECTED AREA TWICE DAILY. 80 g 2  . Ascorbic Acid (VITAMIN C) 100 MG tablet Take 100 mg by mouth daily. Reported on 06/29/2015    . aspirin 81 MG EC tablet Take 1 tablet (81 mg total) by mouth daily. Swallow whole. (Patient not taking: Reported on 06/29/2015) 30 tablet 12  . calcium carbonate 1250 MG capsule Take 1,250 mg by mouth 2  (two) times daily with a meal. Reported on 06/29/2015    . cetirizine (ZYRTEC) 10 MG tablet Take 1 tablet (10 mg total) by mouth daily. 30 tablet 1  . Echinacea 125 MG CAPS Take 1 capsule by mouth daily. Reported on 06/29/2015    . Erythromycin 2 % ointment Apply 1 application topically 2 (two) times daily. for 14 days 25 g 0  . mometasone-formoterol (DULERA) 200-5 MCG/ACT AERO Inhale 2 puffs into the lungs 2 (two) times daily. 1 Inhaler 5  . PATADAY 0.2 % SOLN PLACE 1 DROP INTO EACH EYE DAILY 2.5 mL 0  . PROAIR HFA 108 (90 BASE) MCG/ACT inhaler INHALE 2 PUFFS BY MOUTH EVERY 4 HOURS AS NEEDED FOR WHEEZING 8.5 g 0  . QVAR 80 MCG/ACT inhaler INHALE 2 PUFFS BY MOUTH TWICE DAILY 8.7 g 12  . Specialty Vitamins Products (MAGNESIUM, AMINO ACID CHELATE,) 133 MG tablet Take 1 tablet by mouth 2 (two) times daily. Reported on 06/29/2015     Current Facility-Administered Medications on File Prior to Visit  Medication Dose Route Frequency Provider Last Rate Last Dose  . omalizumab [name]) injection 300 mg  300 mg Subcutaneous Q28 days Jiles Prows, MD   300 mg at 06/21/15 1009    Meds ordered this encounter  Medications  . azithromycin (ZITHROMAX) 500 MG tablet    Sig: Take one tablet once daily for three days.    Dispense:  3 tablet    Refill:  0  . methylPREDNISolone acetate (DEPO-MEDROL) injection 80 mg    Sig:     Past Medical History  Diagnosis Date  . Asthma   . GERD (gastroesophageal reflux disease)     No past surgical history on file.  Allergies  Allergen Reactions  . Etodolac Shortness Of Breath and Anaphylaxis    REACTION: lips swelled  . Penicillins Swelling and Anaphylaxis  . Amoxicillin     REACTION: unspecified  . Augmentin [Amoxicillin-Pot Clavulanate]   . Diclofenac Sodium     REACTION: unspecified  . Iodine     REACTION: unspecified  . Latex   . Omeprazole     REACTION: unspecified  . Other     Other reaction(s): Other (See Comments) Plastic Tape=redness  .  Pantoprazole Sodium     REACTION: unspecified    Review of systems negative except as noted in HPI / PMHx or noted below:  Review of Systems  Constitutional: Negative.   HENT: Negative.   Eyes: Negative.   Respiratory: Negative.   Cardiovascular: Negative.   Gastrointestinal: Negative.   Genitourinary: Negative.   Musculoskeletal: Negative.   Skin: Negative.        Seborrheic dermatitis of face treated with topical agent  Neurological: Negative.   Endo/Heme/Allergies: Negative.   Psychiatric/Behavioral: Negative.      Objective:   Filed Vitals:   06/29/15 1358  Pulse: 96  Resp: 22          Physical Exam  Constitutional: She is well-developed, well-nourished, and in no distress. No distress.  Coughing, slightly nasal voice  HENT:  Head: Normocephalic.  Right Ear: Tympanic membrane, external ear and ear canal normal.  Left Ear: Tympanic membrane, external ear and ear canal normal.  Nose: Mucosal edema (Erythematous) present. No rhinorrhea.  Mouth/Throat: Uvula is midline, oropharynx is clear and moist and mucous membranes are normal. No oropharyngeal exudate.  Eyes: Conjunctivae are normal.  Neck: Trachea normal. No tracheal tenderness present. No tracheal deviation present. No thyromegaly present.  Cardiovascular: Normal rate, regular rhythm, S1 normal, S2 normal and normal heart sounds.   No murmur heard. Pulmonary/Chest: No stridor. No respiratory distress. She has wheezes (End expiratory wheezing heard in all lung fields). She has no rales.  Musculoskeletal: She exhibits no edema.  Lymphadenopathy:       Head (right side): No tonsillar adenopathy present.       Head (left side): No tonsillar adenopathy present.    She has no cervical adenopathy.    She has no axillary adenopathy.  Neurological: She is alert. Gait normal.  Skin: No rash noted. She is not diaphoretic. No erythema. Nails show no clubbing.  Psychiatric: Mood and affect normal.    Diagnostics:     Spirometry was performed and demonstrated an FEV1 of 1.61 at 60 % of predicted.  The patient had an Asthma Control Test with the following results:  .    Assessment and Plan:   1. Severe persistent asthma, with acute exacerbation   2. Allergic rhinoconjunctivitis   3. Gastroesophageal reflux disease, esophagitis presence not specified      1. Depo-Medrol 80 IM delivered in clinic today  2. Azithromycin 500 mg one tablet once a day for 3 days  3. Nasal saline multiple times per day while "sick"  4. Can add over-the-counter Mucinex DM twice a day while "sick"  5. Continue Dulera 200 2 inhalations twice a day and add Qvar 80 2 inhalations twice a day while "sick"  6. Continue montelukast 10 mg daily  7. Continue Flonase one or 2 sprays each nostril daily  8. Continue Xolair and EpiPen  9. Further evaluation and treatment?  10. Return to clinic in 4 months or earlier if problem   I will assume that Nuria's flare of asthma will be handled appropriately with the therapy mentioned above which includes systemic steroids and coverage for possible mycoplasma infection at the same time she continues to use very high doses of inhaled steroids and Xolair. We'll continue her on montelukast and Flonase as well for her upper airway disease and she will continue on her standard therapy for reflux. She will keep in contact with me noting her response today for mentioned therapy. If she does well I'll see her back in this clinic in 4 months or earlier if there is a problem.  Allena Katz, MD [place]

## 2015-06-29 NOTE — Patient Instructions (Addendum)
  1. Depo-Medrol 80 IM delivered in clinic today  2. Azithromycin 500 mg one tablet once a day for 3 days  3. Nasal saline multiple times per day while "sick"  4. Can add over-the-counter Mucinex DM twice a day while "sick"  5. Continue Dulera 200 2 inhalations twice a day and add Qvar 80 2 inhalations twice a day while "sick"  6. Continue montelukast 10 mg daily  7. Continue Flonase one or 2 sprays each nostril daily  8. Continue Xolair and EpiPen  9. Further evaluation and treatment?  10. Return to clinic in 4 months or earlier if problem

## 2015-06-30 ENCOUNTER — Encounter: Payer: Self-pay | Admitting: Allergy and Immunology

## 2015-07-06 ENCOUNTER — Ambulatory Visit: Payer: Medicaid Other | Admitting: Allergy and Immunology

## 2015-07-07 ENCOUNTER — Telehealth: Payer: Self-pay | Admitting: Allergy and Immunology

## 2015-07-07 NOTE — Telephone Encounter (Signed)
Pt called and said that you would write a letter about her not living in a house that the basement has water in it. They are wanting her to move there. [phone].

## 2015-07-07 NOTE — Telephone Encounter (Signed)
Please write letter for patient and forward. Thanks

## 2015-07-07 NOTE — Telephone Encounter (Signed)
Please advise 

## 2015-07-08 NOTE — Telephone Encounter (Signed)
Patient called back with information.  I will start on letter today.   Ms. Fredrik [place] [facility] [address]  [place], [place] [address] [phone]

## 2015-07-08 NOTE — Telephone Encounter (Signed)
I left a message on VM asking Scherry to call me back.  I need to find out who we need to write this letter to.   I need a name and an address.

## 2015-07-09 NOTE — Telephone Encounter (Signed)
Letter written.  I placed letter in Dr. Bruna Potter box for his signature.

## 2015-07-19 ENCOUNTER — Ambulatory Visit (INDEPENDENT_AMBULATORY_CARE_PROVIDER_SITE_OTHER): Payer: Medicaid Other

## 2015-07-19 DIAGNOSIS — J4540 Moderate persistent asthma, uncomplicated: Secondary | ICD-10-CM | POA: Diagnosis not present

## 2015-07-22 ENCOUNTER — Other Ambulatory Visit: Payer: Self-pay | Admitting: Allergy and Immunology

## 2015-08-03 ENCOUNTER — Ambulatory Visit (INDEPENDENT_AMBULATORY_CARE_PROVIDER_SITE_OTHER): Payer: Medicaid Other | Admitting: Allergy and Immunology

## 2015-08-03 VITALS — BP 130/70 | HR 100 | Temp 98.9°F | Resp 18

## 2015-08-03 DIAGNOSIS — J309 Allergic rhinitis, unspecified: Secondary | ICD-10-CM

## 2015-08-03 DIAGNOSIS — J111 Influenza due to unidentified influenza virus with other respiratory manifestations: Secondary | ICD-10-CM | POA: Diagnosis not present

## 2015-08-03 DIAGNOSIS — J4551 Severe persistent asthma with (acute) exacerbation: Secondary | ICD-10-CM | POA: Diagnosis not present

## 2015-08-03 DIAGNOSIS — H1010 Acute atopic conjunctivitis, unspecified eye: Secondary | ICD-10-CM

## 2015-08-03 DIAGNOSIS — K219 Gastro-esophageal reflux disease without esophagitis: Secondary | ICD-10-CM

## 2015-08-03 MED ORDER — MONTELUKAST SODIUM 10 MG PO TABS: 10.0000 mg | ORAL_TABLET | Freq: Every day | ORAL | Status: DC

## 2015-08-03 MED ORDER — OSELTAMIVIR PHOSPHATE 75 MG PO CAPS
ORAL_CAPSULE | ORAL | Status: DC
Start: 2015-08-03 — End: 2015-11-02

## 2015-08-03 NOTE — Patient Instructions (Signed)
  1. Tamiflu 75 mg one tablet twice a day for 5 days  2. Prednisone 10 mg one tablet twice a day for 5 days  3. Nasal saline multiple times per day while "sick"  4. Can add over-the-counter Mucinex DM twice a day while "sick"  5. Continue Dulera 200 2 inhalations twice a day and add Qvar 80 2 inhalations twice a day while "sick"  6. Continue montelukast 10 mg daily  7. Continue Flonase one or 2 sprays each nostril daily  8. Continue Xolair and EpiPen  9. Further evaluation and treatment?  10. Return to clinic in 4 months or earlier if problem

## 2015-08-03 NOTE — Progress Notes (Signed)
Follow-up Note  Referring Provider: [name], MD Primary Provider: Zigmund Gottron, MD Date of Office Visit: 08/03/2015  Subjective:   Kelsey Bullock (DOB: Jan 25, 1961) is a 55 y.o. female who returns to the [facility] on 08/03/2015 in re-evaluation of the following:  HPI Comments: Kelsey Bullock presents this noting that she has felt terrible over the course of the past 4 days. She has nasal congestion and head fullness and ugly nasal discharge and diarrhea and vomiting and coughing and wheezing and bone aching and chills. It should be noted that she had 2 children inside the household last week who had influenza. Prior to this point in time she was doing very well regarding her severe asthma and rhinitis and gastroesophageal reflux disease well using a large collection of medical therapy.   Outpatient Prescriptions Prior to Visit  Medication Sig Dispense Refill  . albuterol (PROVENTIL HFA) 108 (90 BASE) MCG/ACT inhaler INHALE 2 PUFFS BY MOUTH EVERY 4 HOURS AS NEEDED FOR WHEEZING 6.7 g 0  . albuterol (PROVENTIL) (2.5 MG/3ML) 0.083% nebulizer solution USE 1 UNIT IN NEBULIZER EVERY 4 TO 6 HOURS AS NEEDED FOR COUGH OR WHEEZE. 675 mL 0  . beclomethasone (QVAR) 80 MCG/ACT inhaler Inhale 2 puffs into the lungs daily.    Marland Kitchen buPROPion (WELLBUTRIN SR) 100 MG 12 hr tablet Take 100 mg by mouth every morning. Per mental health      . cetirizine (ZYRTEC) 10 MG tablet Take 1 tablet by mouth daily.    . clonazePAM (KLONOPIN) 1 MG tablet Take 1 mg by mouth 2 (two) times daily.      . cyclobenzaprine (FLEXERIL) 10 MG tablet TAKE 1 TABLET BY MOUTH AT BEDTIME AS NEEDED FOR BACK OR NECK PAIN OR SPAMS 30 tablet 3  . EPINEPHrine (EPIPEN 2-PAK) 0.3 mg/0.3 mL IJ SOAJ injection Inject 0.3 mg into the muscle once.    . Erythromycin 2 % ointment Apply 1 application topically 2 (two) times daily. for 14 days 25 g 0  . famotidine (PEPCID) 20 MG tablet TAKE 1 TABLET BY MOUTH EVERY DAY 90  tablet 3  . FLUoxetine (PROZAC) 10 MG capsule Take 10 mg by mouth daily.    . fluticasone (FLONASE) 50 MCG/ACT nasal spray USE 2 SPRAYS IN EACH NOSTRIL DAILY(USE EVEN WHEN NOSE STUFFY) 1 Act 12  . hydrochlorothiazide (HYDRODIURIL) 25 MG tablet Take 1 tablet (25 mg total) by mouth daily. 90 tablet 3  . ibuprofen (ADVIL,MOTRIN) 800 MG tablet TAKE 1 TABLET BY MOUTH THREE TIMES DAILY AS NEEDED FOR PAIN 90 tablet 12  . ipratropium (ATROVENT) 0.06 % nasal spray USE 2 SPRAYS IN EACH NOSTRIL EVERY 6 HOURS IF NEEDED TO DRY UP NOSE 15 mL 3  . lisinopril (PRINIVIL,ZESTRIL) 40 MG tablet Take 1 tablet (40 mg total) by mouth daily. 90 tablet 3  . mometasone-formoterol (DULERA) 200-5 MCG/ACT AERO Inhale 2 puffs into the lungs 2 (two) times daily. 1 Inhaler 5  . montelukast (SINGULAIR) 10 MG tablet Take 1 tablet (10 mg total) by mouth at bedtime. 30 tablet 3  . norethindrone (AYGESTIN) 5 MG tablet Take 2.5 mg by mouth daily.     Marland Kitchen nystatin (MYCOSTATIN) powder Apply topically 3 (three) times daily. 60 g 12  . ondansetron (ZOFRAN-ODT) 4 MG disintegrating tablet Take 1 tablet (4 mg total) by mouth every 8 (eight) hours as needed for nausea. 4 tablet 0  . PATADAY 0.2 % SOLN PLACE 1 DROP INTO EACH EYE DAILY 2.5 mL  0  . PROAIR HFA 108 (90 BASE) MCG/ACT inhaler INHALE 2 PUFFS BY MOUTH EVERY 4 HOURS AS NEEDED FOR WHEEZING 8.5 g 0  . QVAR 80 MCG/ACT inhaler INHALE 2 PUFFS BY MOUTH TWICE DAILY 8.7 g 12  . simvastatin (ZOCOR) 80 MG tablet TAKE 1 TABLET BY MOUTH EVERY NIGHT AT BEDTIME 90 tablet 3  . Specialty Vitamins Products (MAGNESIUM, AMINO ACID CHELATE,) 133 MG tablet Take 1 tablet by mouth 2 (two) times daily. Reported on 06/29/2015    . traZODone (DESYREL) 100 MG tablet Take 100 mg by mouth at bedtime.      . triamcinolone cream (KENALOG) 0.1 % APPLY TO THE AFFECTED AREA TWICE DAILY. 80 g 2  . mometasone-formoterol (DULERA) 200-5 MCG/ACT AERO Inhale 2 puffs into the lungs 2 (two) times daily. 1 Inhaler 5  . Ascorbic  Acid (VITAMIN C) 100 MG tablet Take 100 mg by mouth daily. Reported on 08/03/2015    . aspirin 81 MG EC tablet Take 1 tablet (81 mg total) by mouth daily. Swallow whole. (Patient not taking: Reported on 06/29/2015) 30 tablet 12  . calcium carbonate 1250 MG capsule Take 1,250 mg by mouth 2 (two) times daily with a meal. Reported on 08/03/2015    . cetirizine (ZYRTEC) 10 MG tablet Take 1 tablet (10 mg total) by mouth daily. 30 tablet 1  . Echinacea 125 MG CAPS Take 1 capsule by mouth daily. Reported on 08/03/2015    . mirtazapine (REMERON SOL-TAB) 30 MG disintegrating tablet Take 30 mg by mouth daily. Per mental health     . mirtazapine (REMERON) 45 MG tablet TK 1 T PO ATN  5  . omeprazole (PRILOSEC) 40 MG capsule Take 40 mg by mouth daily. Reported on 08/03/2015    . albuterol (PROVENTIL HFA) 108 (90 Base) MCG/ACT inhaler     . Albuterol Sulfate POWD Frequency:   Dosage:0.0     Instructions:  Note:    . azithromycin (ZITHROMAX) 500 MG tablet Take one tablet once daily for three days. 3 tablet 0  . buPROPion (WELLBUTRIN) 100 MG tablet TK 1 TO 2 TS PO AT NOON  5  . PATADAY 0.2 % SOLN PLACE 1 DROP INTO EACH EYE DAILY 2.5 mL 0   Facility-Administered Medications Prior to Visit  Medication Dose Route Frequency Provider Last Rate Last Dose  . omalizumab [name]) injection 300 mg  300 mg Subcutaneous Q28 days Jiles Prows, MD   300 mg at 07/19/15 [id number]    Past Medical History  Diagnosis Date  . Asthma   . GERD (gastroesophageal reflux disease)     No past surgical history on file.  Allergies  Allergen Reactions  . Etodolac Shortness Of Breath and Anaphylaxis    REACTION: lips swelled  . Penicillins Swelling and Anaphylaxis  . Amoxicillin     REACTION: unspecified  . Augmentin [Amoxicillin-Pot Clavulanate]   . Diclofenac Sodium     REACTION: unspecified  . Iodine     REACTION: unspecified  . Latex   . Omeprazole     REACTION: unspecified  . Other     Other reaction(s): Other (See  Comments) Plastic Tape=redness  . Pantoprazole Sodium     REACTION: unspecified    Review of systems negative except as noted in HPI / PMHx or noted below:  Review of Systems  Constitutional: Negative.   HENT: Negative.   Eyes: Negative.   Respiratory: Negative.   Cardiovascular: Negative.   Gastrointestinal: Negative.   Genitourinary: Negative.  Musculoskeletal: Negative.   Skin: Negative.   Neurological: Negative.   Endo/Heme/Allergies: Negative.   Psychiatric/Behavioral: Negative.      Objective:   Filed Vitals:   08/03/15 1730  BP: 130/70  Pulse: 100  Temp: 98.9 F (37.2 C)  Resp: 18          Physical Exam  Constitutional: She is well-developed, well-nourished, and in no distress.  HENT:  Head: Normocephalic.  Right Ear: Tympanic membrane, external ear and ear canal normal.  Left Ear: Tympanic membrane, external ear and ear canal normal.  Nose: Mucosal edema (Erythematous) present. No rhinorrhea.  Mouth/Throat: Uvula is midline, oropharynx is clear and moist and mucous membranes are normal. No oropharyngeal exudate.  Eyes: Conjunctivae are normal.  Neck: Trachea normal. No tracheal tenderness present. No tracheal deviation present. No thyromegaly present.  Cardiovascular: Normal rate, regular rhythm, S1 normal, S2 normal and normal heart sounds.   No murmur heard. Pulmonary/Chest: No stridor. No respiratory distress. She has wheezes (bilateral expiratory wheezing). She has no rales.  Musculoskeletal: She exhibits no edema.  Lymphadenopathy:       Head (right side): No tonsillar adenopathy present.       Head (left side): No tonsillar adenopathy present.    She has no cervical adenopathy.    She has no axillary adenopathy.  Neurological: She is alert. Gait normal.  Skin: No rash noted. She is not diaphoretic. No erythema. Nails show no clubbing.  Psychiatric: Mood and affect normal.    Diagnostics:    Spirometry was performed and demonstrated an FEV1  of 1.35 at 50 % of predicted.  The patient had an Asthma Control Test with the following results:  .    Assessment and Plan:   1. Severe persistent asthma, with acute exacerbation   2. Allergic rhinoconjunctivitis   3. Gastroesophageal reflux disease, esophagitis presence not specified   4. Influenza     1. Tamiflu 75 mg one tablet twice a day for 5 days  2. Prednisone 10 mg one tablet twice a day for 5 days  3. Nasal saline multiple times per day while "sick"  4. Can add over-the-counter Mucinex DM twice a day while "sick"  5. Continue Dulera 200 2 inhalations twice a day and add Qvar 80 2 inhalations twice a day while "sick"  6. Continue montelukast 10 mg daily  7. Continue Flonase one or 2 sprays each nostril daily  8. Continue Xolair and EpiPen  9. Further evaluation and treatment?  10. Return to clinic in 4 months or earlier if problem   Mirae appears to have influenza given her clinical presentation and the fact that influenza was inside the household or earlier this past week. I will treat her with Tamiflu and is well give her some systemic steroids to help with her rather significant respiratory tract inflammation and we'll see if things go day today. She'll keep in contact with me noting her response to this approach. She does well I will see her back in this clinic in 4 months or earlier if there is a problem.  Allena Katz, MD [place]

## 2015-08-04 ENCOUNTER — Encounter: Payer: Self-pay | Admitting: Allergy and Immunology

## 2015-08-17 ENCOUNTER — Ambulatory Visit (INDEPENDENT_AMBULATORY_CARE_PROVIDER_SITE_OTHER): Payer: Medicaid Other | Admitting: *Deleted

## 2015-08-17 DIAGNOSIS — J4550 Severe persistent asthma, uncomplicated: Secondary | ICD-10-CM | POA: Diagnosis not present

## 2015-08-24 ENCOUNTER — Telehealth: Payer: Self-pay | Admitting: Allergy and Immunology

## 2015-08-24 NOTE — Telephone Encounter (Signed)
Pt called and would like for you to call in the magic mouth wash, to [facility] on [address] [phone].

## 2015-08-24 NOTE — Telephone Encounter (Signed)
Left voice mail for patient to call back about her medication she is requesting. I do not see it in her current list of medications

## 2015-08-25 ENCOUNTER — Other Ambulatory Visit: Payer: Self-pay

## 2015-08-25 NOTE — Telephone Encounter (Signed)
Patient is wanting the magic mouth wash, we do not see where it has been done by you Dr. Raliegh Ip.  Please Advise  Thanks

## 2015-08-25 NOTE — Telephone Encounter (Signed)
PLEASE ADVISE IF IT IS OK TO SENT IN THIS RX?

## 2015-08-26 ENCOUNTER — Encounter: Payer: Self-pay | Admitting: Family Medicine

## 2015-08-26 ENCOUNTER — Ambulatory Visit (INDEPENDENT_AMBULATORY_CARE_PROVIDER_SITE_OTHER): Payer: Medicaid Other | Admitting: Family Medicine

## 2015-08-26 ENCOUNTER — Other Ambulatory Visit: Payer: Self-pay

## 2015-08-26 VITALS — BP 139/49 | HR 84 | Temp 97.8°F | Ht 65.5 in | Wt >= 6400 oz

## 2015-08-26 DIAGNOSIS — B370 Candidal stomatitis: Secondary | ICD-10-CM | POA: Diagnosis not present

## 2015-08-26 DIAGNOSIS — Z1159 Encounter for screening for other viral diseases: Secondary | ICD-10-CM | POA: Insufficient documentation

## 2015-08-26 DIAGNOSIS — E1169 Type 2 diabetes mellitus with other specified complication: Secondary | ICD-10-CM

## 2015-08-26 DIAGNOSIS — J4550 Severe persistent asthma, uncomplicated: Secondary | ICD-10-CM | POA: Diagnosis not present

## 2015-08-26 DIAGNOSIS — F331 Major depressive disorder, recurrent, moderate: Secondary | ICD-10-CM | POA: Diagnosis not present

## 2015-08-26 DIAGNOSIS — E669 Obesity, unspecified: Secondary | ICD-10-CM

## 2015-08-26 DIAGNOSIS — I10 Essential (primary) hypertension: Secondary | ICD-10-CM | POA: Diagnosis not present

## 2015-08-26 DIAGNOSIS — R1033 Periumbilical pain: Secondary | ICD-10-CM | POA: Diagnosis not present

## 2015-08-26 DIAGNOSIS — E119 Type 2 diabetes mellitus without complications: Secondary | ICD-10-CM

## 2015-08-26 DIAGNOSIS — E7800 Pure hypercholesterolemia, unspecified: Secondary | ICD-10-CM | POA: Diagnosis not present

## 2015-08-26 LAB — COMPLETE METABOLIC PANEL WITH GFR
ALT: 11 U/L (ref 6–29)
AST: 10 U/L (ref 10–35)
Albumin: 3.8 g/dL (ref 3.6–5.1)
Alkaline Phosphatase: 78 U/L (ref 33–130)
BUN: 11 mg/dL (ref 7–25)
CO2: 26 mmol/L (ref 20–31)
Calcium: 9.2 mg/dL (ref 8.6–10.4)
Chloride: 100 mmol/L (ref 98–110)
Creat: 0.64 mg/dL (ref 0.50–1.05)
GFR, Est African American: >89 mL/min (ref >=60)
GFR, Est Non African American: >89 mL/min (ref >=60)
Glucose, Bld: 112 mg/dL — ABNORMAL HIGH (ref 65–99)
Potassium: 4.6 mmol/L (ref 3.5–5.3)
Sodium: 138 mmol/L (ref 135–146)
Total Bilirubin: 0.5 mg/dL (ref 0.2–1.2)
Total Protein: 7 g/dL (ref 6.1–8.1)

## 2015-08-26 LAB — LIPID PANEL
Cholesterol: 170 mg/dL (ref 125–200)
HDL: 51 mg/dL (ref >=46)
LDL Cholesterol: 93 mg/dL (ref <130)
Total CHOL/HDL Ratio: 3.3 Ratio (ref <=5.0)
Triglycerides: 130 mg/dL (ref <150)
VLDL: 26 mg/dL (ref <30)

## 2015-08-26 LAB — POCT GLYCOSYLATED HEMOGLOBIN (HGB A1C): Hemoglobin A1C: 6.8

## 2015-08-26 MED ORDER — NYSTATIN 100000 UNIT/ML MT SUSP: 5.0000 mL | Freq: Four times a day (QID) | OROMUCOSAL | Status: DC

## 2015-08-26 NOTE — Patient Instructions (Signed)
See me in three months. I sent in a prescription for the mouthwash. You still need to lose weight. Good luck with your situation.

## 2015-08-26 NOTE — Telephone Encounter (Signed)
Can call in [place]

## 2015-08-27 LAB — HEPATITIS C ANTIBODY: HCV Ab: NEGATIVE

## 2015-08-27 NOTE — Assessment & Plan Note (Signed)
No delusions.  No progress.  As Kelsey Bullock's life continues to tumble out of control, her depression worsens.  She sees mental health for both meds and counseling.

## 2015-08-27 NOTE — Assessment & Plan Note (Signed)
No progress and I sadly don't anticipate any with her sense of powerlessness.

## 2015-08-27 NOTE — Assessment & Plan Note (Signed)
Need to continue inhaled steroids.  Instructed on rinsing mouth to avoid thrush.

## 2015-08-27 NOTE — Assessment & Plan Note (Signed)
Neglected to mention in H&PE that she had symptoms and oral findings consistent with thrush attributed to inhaled steroid use.

## 2015-08-27 NOTE — Progress Notes (Signed)
   Subjective:    Patient ID: EDEE Bullock, female    DOB: Nov 30, 1960, 55 y.o.   MRN: [id number]  HPIDonna is here because of abd/periumbilical pain around previous laporascopic incision.  Wonders about a hernia.  Of course, it is never simple with Kelsey Bullock. 1. Abd pain.  Worsening slowly over last couple of months.  No specific trauma or heavy lifting event.  Maybe swelling, but difficult to tell with her weight. 2. Morbid obesity.  Worsened wt gain.  She feels powerless to control.  Depression is influencing. 3. Major depression, chronic and worse.  She continues to live in a hotel - she has lost her subsidized housing.  Strange story about a similar aged friend who she has known since a teen has convinced two of Kelsey Bullock's children (Kelsey Bullock and daughter) to live with her.  Only Kelsey Bullock with Kelsey Bullock.  She feels betrayed by this friend.  Continues strong external locus of control. 4. Hyperlipidemia she is fasting today and can get labs drawn.   5.  Hypertension: control seems good.    Review of Systems     Objective:   Physical Exam Abd no palpable abnormality or buldging at umbilicus.         Assessment & Plan:

## 2015-08-27 NOTE — Assessment & Plan Note (Signed)
One time screen 

## 2015-08-27 NOTE — Assessment & Plan Note (Signed)
Observe.  No evidence of hernia.

## 2015-09-01 ENCOUNTER — Other Ambulatory Visit: Payer: Self-pay | Admitting: *Deleted

## 2015-09-01 MED ORDER — OMALIZUMAB 150 MG ~~LOC~~ SOLR: 300.0000 mg | SUBCUTANEOUS | Status: DC

## 2015-09-13 ENCOUNTER — Other Ambulatory Visit: Payer: Self-pay | Admitting: Family Medicine

## 2015-09-13 ENCOUNTER — Other Ambulatory Visit: Payer: Self-pay | Admitting: *Deleted

## 2015-09-13 ENCOUNTER — Ambulatory Visit (INDEPENDENT_AMBULATORY_CARE_PROVIDER_SITE_OTHER): Payer: Medicaid Other

## 2015-09-13 DIAGNOSIS — J45901 Unspecified asthma with (acute) exacerbation: Secondary | ICD-10-CM

## 2015-09-13 DIAGNOSIS — J4540 Moderate persistent asthma, uncomplicated: Secondary | ICD-10-CM

## 2015-10-11 ENCOUNTER — Ambulatory Visit (INDEPENDENT_AMBULATORY_CARE_PROVIDER_SITE_OTHER): Payer: Medicaid Other | Admitting: *Deleted

## 2015-10-11 DIAGNOSIS — J4540 Moderate persistent asthma, uncomplicated: Secondary | ICD-10-CM | POA: Diagnosis not present

## 2015-10-29 ENCOUNTER — Other Ambulatory Visit: Payer: Self-pay | Admitting: *Deleted

## 2015-10-29 DIAGNOSIS — L304 Erythema intertrigo: Secondary | ICD-10-CM

## 2015-10-29 MED ORDER — NYSTATIN 100000 UNIT/GM EX POWD: Freq: Three times a day (TID) | CUTANEOUS | Status: DC

## 2015-11-02 ENCOUNTER — Encounter: Payer: Self-pay | Admitting: Allergy and Immunology

## 2015-11-02 ENCOUNTER — Ambulatory Visit (INDEPENDENT_AMBULATORY_CARE_PROVIDER_SITE_OTHER): Payer: Medicaid Other | Admitting: Allergy and Immunology

## 2015-11-02 VITALS — HR 84 | Temp 98.3°F | Resp 22

## 2015-11-02 DIAGNOSIS — J309 Allergic rhinitis, unspecified: Secondary | ICD-10-CM | POA: Diagnosis not present

## 2015-11-02 DIAGNOSIS — J4551 Severe persistent asthma with (acute) exacerbation: Secondary | ICD-10-CM | POA: Diagnosis not present

## 2015-11-02 DIAGNOSIS — H1010 Acute atopic conjunctivitis, unspecified eye: Secondary | ICD-10-CM

## 2015-11-02 DIAGNOSIS — K219 Gastro-esophageal reflux disease without esophagitis: Secondary | ICD-10-CM | POA: Diagnosis not present

## 2015-11-02 MED ORDER — METHYLPREDNISOLONE ACETATE 80 MG/ML IJ SUSP
80.0000 mg | Freq: Once | INTRAMUSCULAR | Status: AC
  Administered 2015-11-02: 80 mg via INTRAMUSCULAR

## 2015-11-02 MED ORDER — AZITHROMYCIN 500 MG PO TABS: ORAL_TABLET | ORAL | Status: DC

## 2015-11-02 NOTE — Patient Instructions (Addendum)
  1. Depo-Medrol 80 IM delivered in clinic today  2. Azithromycin 500 mg one tablet once a day for 3 days  3. Nasal saline multiple times per day while "sick"  4. Can add over-the-counter Mucinex DM twice a day while "sick"  5. Continue Dulera 200 2 inhalations twice a day and add Qvar 80 2 inhalations twice a day while "sick"  6. Continue montelukast 10 mg daily  7. Continue Flonase one or 2 sprays each nostril daily  8. Continue Xolair and EpiPen  9. Continue Omeprazole 40 mg in AM plus Famotidine 20 mg in PM  10. Return to clinic in 3 months or earlier if problem

## 2015-11-02 NOTE — Progress Notes (Signed)
Follow-up Note  Referring Provider: [name], MD Primary Provider: Zigmund Gottron, MD Date of Office Visit: 11/02/2015  Subjective:   Kelsey Bullock (DOB: 1960/12/15) is a 55 y.o. female who returns to the [facility] [place] on 11/02/2015 in re-evaluation of the following:  HPI: Kelsey Bullock presents to this clinic in evaluation of an acute respiratory tract infection that has given rise to an asthma flare over the course of the past 48 hours. It started with laryngitis this past Sunday quickly followed by very severe stuffiness and nasal congestion and coughing. She has complete anosmia. The material emanating from her nose is deep yellow. She feels feverish. Prior to this point in time she was doing relatively well with her severe asthma treated with Xolair, allergic rhinoconjunctivitis, and reflux. She is on a large collection of medical therapy including omalizumab, Dulera, Qvar as part of an action plan for asthma flare, nasal steroid, montelukast, and omeprazole plus famotidine to control her respiratory conditions. I last saw her in his clinic in March at which time she also appeared to have influenza that required a very short course of systemic steroids along with Tamiflu which fortunately was successful in eliminating her respiratory tract flare.    Medication List           albuterol (2.5 MG/3ML) 0.083% nebulizer solution  Commonly known as:  PROVENTIL  USE 1 UNIT IN NEBULIZER EVERY 4 TO 6 HOURS AS NEEDED FOR COUGH OR WHEEZE.     PROAIR HFA 108 (90 Base) MCG/ACT inhaler  Generic drug:  albuterol  INHALE 2 PUFFS INTO THE LUNGS EVERY 4 HOURS AS NEEDED FOR WHEEZING     aspirin 81 MG EC tablet  Take 1 tablet (81 mg total) by mouth daily. Swallow whole.     buPROPion 100 MG tablet  Commonly known as:  WELLBUTRIN     calcium carbonate 1250 MG capsule  Take 1,250 mg by mouth 2 (two) times daily with a meal. Reported on 11/02/2015     cetirizine 10 MG  tablet  Commonly known as:  ZYRTEC  Take 1 tablet by mouth daily.     clonazePAM 1 MG tablet  Commonly known as:  KLONOPIN  Take 1 mg by mouth 2 (two) times daily.     cyclobenzaprine 10 MG tablet  Commonly known as:  FLEXERIL  TAKE 1 TABLET BY MOUTH AT BEDTIME AS NEEDED FOR BACK OR NECK PAIN OR SPAMS     Echinacea 125 MG Caps  Take 1 capsule by mouth daily. Reported on 11/02/2015     EPIPEN 2-PAK 0.3 mg/0.3 mL Soaj injection  Generic drug:  EPINEPHrine  Inject 0.3 mg into the muscle once.     Erythromycin 2 % ointment  Apply 1 application topically 2 (two) times daily. for 14 days     famotidine 20 MG tablet  Commonly known as:  PEPCID  TAKE 1 TABLET BY MOUTH EVERY DAY     FLUoxetine 10 MG capsule  Commonly known as:  PROZAC  Take 10 mg by mouth daily.     fluticasone 50 MCG/ACT nasal spray  Commonly known as:  FLONASE  USE 2 SPRAYS IN EACH NOSTRIL DAILY(USE EVEN WHEN NOSE STUFFY)     hydrochlorothiazide 25 MG tablet  Commonly known as:  HYDRODIURIL  TAKE 1 TABLET BY MOUTH EVERY DAY     ibuprofen 800 MG tablet  Commonly known as:  ADVIL,MOTRIN  TAKE 1 TABLET BY MOUTH THREE TIMES DAILY  AS NEEDED FOR PAIN     ipratropium 0.06 % nasal spray  Commonly known as:  ATROVENT  USE 2 SPRAYS IN EACH NOSTRIL EVERY 6 HOURS IF NEEDED TO DRY UP NOSE     lisinopril 40 MG tablet  Commonly known as:  PRINIVIL,ZESTRIL  Take 1 tablet (40 mg total) by mouth daily.     magnesium (amino acid chelate) 133 MG tablet  Take 1 tablet by mouth 2 (two) times daily. Reported on 11/02/2015     mirtazapine 45 MG tablet  Commonly known as:  REMERON     mometasone-formoterol 200-5 MCG/ACT Aero  Commonly known as:  DULERA  Inhale 2 puffs into the lungs 2 (two) times daily.     montelukast 10 MG tablet  Commonly known as:  SINGULAIR  Take 1 tablet (10 mg total) by mouth daily.     [place] DM PO  Take by mouth as needed.     norethindrone 5 MG tablet  Commonly known as:  AYGESTIN  Take  2.5 mg by mouth daily.     nystatin 100000 UNIT/ML suspension  Commonly known as:  MYCOSTATIN  Take 5 mLs (500,000 Units total) by mouth 4 (four) times daily.     nystatin powder  Commonly known as:  MYCOSTATIN  Apply topically 3 (three) times daily.     omalizumab 150 MG injection  Commonly known as:  XOLAIR  Inject 300 mg into the skin every 28 (twenty-eight) days.     omeprazole 40 MG capsule  Commonly known as:  PRILOSEC  Take 40 mg by mouth daily. Reported on 08/03/2015     ondansetron 4 MG disintegrating tablet  Commonly known as:  ZOFRAN-ODT  Take 1 tablet (4 mg total) by mouth every 8 (eight) hours as needed for nausea.     PATADAY 0.2 % Soln  Generic drug:  Olopatadine HCl  PLACE 1 DROP INTO EACH EYE DAILY     beclomethasone 80 MCG/ACT inhaler  Commonly known as:  QVAR  Inhale 2 puffs into the lungs daily.     QVAR 80 MCG/ACT inhaler  Generic drug:  beclomethasone  INHALE 2 PUFFS BY MOUTH TWICE DAILY     simvastatin 80 MG tablet  Commonly known as:  ZOCOR  TAKE 1 TABLET BY MOUTH EVERY NIGHT AT BEDTIME     traZODone 100 MG tablet  Commonly known as:  DESYREL  Take 100 mg by mouth at bedtime.     triamcinolone cream 0.1 %  Commonly known as:  KENALOG  APPLY TO THE AFFECTED AREA TWICE DAILY.     vitamin C 100 MG tablet  Take 100 mg by mouth daily. Reported on 08/03/2015        Past Medical History  Diagnosis Date  . Asthma   . GERD (gastroesophageal reflux disease)   . Gall stone   . Depression   . Allergic rhinitis     Past Surgical History  Procedure Laterality Date  . Tympanostomy tube placement    . Adenoidectomy    . Tonsillectomy    . Cesarean section    . Cyst excision    . Ovarian cyst removal    . Removal of gallstones      Allergies  Allergen Reactions  . Etodolac Shortness Of Breath and Anaphylaxis    REACTION: lips swelled  . Penicillins Swelling and Anaphylaxis  . Amoxicillin     REACTION: unspecified  . Augmentin  [Amoxicillin-Pot Clavulanate]   . Diclofenac Sodium  REACTION: unspecified  . Iodine     REACTION: unspecified  . Latex   . Omeprazole     REACTION: unspecified  . Other     Other reaction(s): Other (See Comments) Plastic Tape=redness  . Pantoprazole Sodium     REACTION: unspecified    Review of systems negative except as noted in HPI / PMHx or noted below:  Review of Systems  Constitutional: Negative.   HENT: Negative.   Eyes: Negative.   Respiratory: Negative.   Cardiovascular: Negative.   Gastrointestinal: Negative.   Genitourinary: Negative.   Musculoskeletal: Negative.   Skin: Negative.   Neurological: Negative.   Endo/Heme/Allergies: Negative.   Psychiatric/Behavioral: Negative.      Objective:   Filed Vitals:   11/02/15 0855  Pulse: 84  Temp: 98.3 F (36.8 C)  Resp: 22          Physical Exam  Constitutional: She is well-developed, well-nourished, and in no distress.  Nasal voice, coughing  HENT:  Head: Normocephalic.  Right Ear: Tympanic membrane, external ear and ear canal normal.  Left Ear: Tympanic membrane, external ear and ear canal normal.  Nose: Mucosal edema (Erythematous) and rhinorrhea (Purulent nasal discharge) present.  Mouth/Throat: Uvula is midline, oropharynx is clear and moist and mucous membranes are normal. No oropharyngeal exudate.  Eyes: Conjunctivae are normal.  Neck: Trachea normal. No tracheal tenderness present. No tracheal deviation present. No thyromegaly present.  Cardiovascular: Normal rate, regular rhythm, S1 normal, S2 normal and normal heart sounds.   No murmur heard. Pulmonary/Chest: No stridor. No respiratory distress. She has wheezes (Bilateral expiratory wheezes). She has no rales.  Musculoskeletal: She exhibits no edema.  Lymphadenopathy:       Head (right side): No tonsillar adenopathy present.       Head (left side): No tonsillar adenopathy present.    She has no cervical adenopathy.  Neurological: She is  alert. Gait normal.  Skin: No rash noted. She is not diaphoretic. No erythema. Nails show no clubbing.  Psychiatric: Mood and affect normal.    Diagnostics: Oxygen saturation at rest on room air was 96%   Spirometry was performed and demonstrated an FEV1 of 1.72 at 59 % of predicted.  The patient had an Asthma Control Test with the following results:  .    Assessment and Plan:   1. Severe persistent asthma, with acute exacerbation   2. Allergic rhinoconjunctivitis   3. Gastroesophageal reflux disease, esophagitis presence not specified     1. Depo-Medrol 80 IM delivered in clinic today  2. Azithromycin 500 mg one tablet once a day for 3 days  3. Nasal saline multiple times per day while "sick"  4. Can add over-the-counter Mucinex DM twice a day while "sick"  5. Continue Dulera 200 2 inhalations twice a day and add Qvar 80 2 inhalations twice a day while "sick"  6. Continue montelukast 10 mg daily  7. Continue Flonase one or 2 sprays each nostril daily  8. Continue Xolair and EpiPen  9. Continue Omeprazole 40 mg in AM plus Famotidine 20 mg in PM  10. Return to clinic in 3 months or earlier if problem   I will assume that Kelsey Bullock will do well with therapy mentioned above which includes the administration of systemic steroids in conjunction with her chronic anti-inflammatory medications delivered to her respiratory tract as well as consistent therapy for reflux.In addition, I will cover her for the possibility of a mycoplasma infection with azithromycin. She will keep in contact with  me noting her response to this approach. Certainly if she develops significant symptoms as we move forward we'll need to readdress our approach. I would like for her to remain on Dulera and Qvar in combination for at least the next month while she undergoes repair from this most recent respiratory tract insult.  Allena Katz, MD [place]

## 2015-11-08 ENCOUNTER — Ambulatory Visit (INDEPENDENT_AMBULATORY_CARE_PROVIDER_SITE_OTHER): Payer: Medicaid Other

## 2015-11-08 DIAGNOSIS — J4540 Moderate persistent asthma, uncomplicated: Secondary | ICD-10-CM

## 2015-11-14 ENCOUNTER — Other Ambulatory Visit: Payer: Self-pay | Admitting: Allergy and Immunology

## 2015-12-08 ENCOUNTER — Ambulatory Visit (INDEPENDENT_AMBULATORY_CARE_PROVIDER_SITE_OTHER): Payer: Medicaid Other

## 2015-12-08 DIAGNOSIS — J4540 Moderate persistent asthma, uncomplicated: Secondary | ICD-10-CM | POA: Diagnosis not present

## 2015-12-14 ENCOUNTER — Other Ambulatory Visit: Payer: Self-pay | Admitting: Allergy and Immunology

## 2015-12-14 ENCOUNTER — Telehealth: Payer: Self-pay | Admitting: Allergy and Immunology

## 2015-12-14 NOTE — Telephone Encounter (Signed)
Refilled through [facility]

## 2015-12-14 NOTE — Telephone Encounter (Signed)
She needs a refill on her flonase.  Uses [facility] on [address] and [address].

## 2015-12-22 ENCOUNTER — Ambulatory Visit (INDEPENDENT_AMBULATORY_CARE_PROVIDER_SITE_OTHER): Payer: Medicaid Other | Admitting: Family Medicine

## 2015-12-22 ENCOUNTER — Encounter: Payer: Self-pay | Admitting: Family Medicine

## 2015-12-22 VITALS — BP 121/79 | HR 81 | Temp 98.3°F | Ht 65.5 in | Wt >= 6400 oz

## 2015-12-22 DIAGNOSIS — I10 Essential (primary) hypertension: Secondary | ICD-10-CM | POA: Diagnosis not present

## 2015-12-22 DIAGNOSIS — E119 Type 2 diabetes mellitus without complications: Secondary | ICD-10-CM | POA: Diagnosis not present

## 2015-12-22 DIAGNOSIS — E669 Obesity, unspecified: Secondary | ICD-10-CM

## 2015-12-22 DIAGNOSIS — J4550 Severe persistent asthma, uncomplicated: Secondary | ICD-10-CM

## 2015-12-22 DIAGNOSIS — E1169 Type 2 diabetes mellitus with other specified complication: Secondary | ICD-10-CM

## 2015-12-22 LAB — POCT GLYCOSYLATED HEMOGLOBIN (HGB A1C): Hemoglobin A1C: 6.3

## 2015-12-22 NOTE — Patient Instructions (Signed)
With your better eating, you have, 1. Lost 5 lbs. 2. Your blood pressure is down 3. You diabetes is better controled. Please continue to work hard on this.   See me in the fall for a recheck and a tetanus shot.

## 2015-12-23 NOTE — Progress Notes (Signed)
   Subjective:    Patient ID: Kelsey Bullock, female    DOB: Apr 23, 1961, 55 y.o.   MRN: [id number]  HPI Multiple issues: 1. Life remains chaotic.  She and son, [name], are currenly living at her sister's and hope to move into apartment soon.  Other two children are with her ex friend, Lattie Haw.  Finances remain bad. 2. Asthma seems stable on current meds.  No summer exacerbations. 3. Morbid obesity.  She has lost 5 lbs since last visit!!  We celebrate small victories. 4. For DM recheck.  Currently diet controled.      Review of Systems     Objective:   Physical ExamVS and wt noted Cardiac RRR without m or g Lungs clear, no wheeze.        Assessment & Plan:

## 2015-12-23 NOTE — Assessment & Plan Note (Signed)
Modest wt loss.  Encouraged to continue.

## 2015-12-23 NOTE — Assessment & Plan Note (Signed)
A1C improved with modest weight loss.

## 2015-12-23 NOTE — Assessment & Plan Note (Signed)
Stable on current meds 

## 2016-01-03 ENCOUNTER — Ambulatory Visit (INDEPENDENT_AMBULATORY_CARE_PROVIDER_SITE_OTHER): Payer: Medicaid Other

## 2016-01-03 ENCOUNTER — Other Ambulatory Visit: Payer: Self-pay | Admitting: Family Medicine

## 2016-01-03 ENCOUNTER — Other Ambulatory Visit: Payer: Self-pay

## 2016-01-03 DIAGNOSIS — J4540 Moderate persistent asthma, uncomplicated: Secondary | ICD-10-CM | POA: Diagnosis not present

## 2016-01-03 MED ORDER — BECLOMETHASONE DIPROPIONATE 80 MCG/ACT IN AERS: 2.0000 | INHALATION_SPRAY | Freq: Two times a day (BID) | RESPIRATORY_TRACT | 0 refills | Status: DC

## 2016-01-03 MED ORDER — OMALIZUMAB 150 MG ~~LOC~~ SOLR
300.0000 mg | SUBCUTANEOUS | Status: DC
  Administered 2016-01-03 – 2016-10-13 (×9): 300 mg via SUBCUTANEOUS

## 2016-01-05 ENCOUNTER — Ambulatory Visit: Payer: Medicaid Other | Admitting: Family Medicine

## 2016-01-15 ENCOUNTER — Other Ambulatory Visit: Payer: Self-pay | Admitting: Family Medicine

## 2016-01-16 ENCOUNTER — Other Ambulatory Visit: Payer: Self-pay | Admitting: Family Medicine

## 2016-01-28 ENCOUNTER — Ambulatory Visit (INDEPENDENT_AMBULATORY_CARE_PROVIDER_SITE_OTHER): Payer: Medicaid Other | Admitting: Internal Medicine

## 2016-01-28 ENCOUNTER — Encounter: Payer: Self-pay | Admitting: Internal Medicine

## 2016-01-28 VITALS — BP 150/115 | HR 92 | Temp 98.1°F | Wt >= 6400 oz

## 2016-01-28 DIAGNOSIS — R0600 Dyspnea, unspecified: Secondary | ICD-10-CM | POA: Diagnosis present

## 2016-01-28 DIAGNOSIS — R600 Localized edema: Secondary | ICD-10-CM

## 2016-01-28 LAB — HM DIABETES EYE EXAM

## 2016-01-28 LAB — CBC
HCT: 41.7 % (ref 35.0–45.0)
Hemoglobin: 13.5 g/dL (ref 11.7–15.5)
MCH: 29.5 pg (ref 27.0–33.0)
MCHC: 32.4 g/dL (ref 32.0–36.0)
MCV: 91 fL (ref 80.0–100.0)
MPV: 11 fL (ref 7.5–12.5)
Platelets: 223 10*3/uL (ref 140–400)
RBC: 4.58 MIL/uL (ref 3.80–5.10)
RDW: 13.9 % (ref 11.0–15.0)
WBC: 7.9 10*3/uL (ref 3.8–10.8)

## 2016-01-28 NOTE — Assessment & Plan Note (Addendum)
Likely venous stasis secondary to morbid obesity given improvement with elevation and worsening of dependent edema with increased periods of time on her feet. However, symptoms of orthopnea, DOE, and PND are concerning for CHF and warrant further work up. Patient has no previous echo on file. Weight has been stable over past year so low concern for severe volume overload. Will also rule out anemia and thyroid disease as cause of symptoms.  Low suspicion for DVT as cause of bilateral LE edema.  -BNP ordered today, if elevated would obtain echo  -TSH and CBC ordered today  -patient to resume taking HCTZ  -patient to keep feet elevated as much as possible  -elected not to order compression stockings given body habitus and severe difficulty with putting them on  -follow up with PCP

## 2016-01-28 NOTE — Patient Instructions (Signed)
Please start taking your HCTZ again to help with fluid retention. Keep your feet elevated throughout the day as much as possible.   We will call you with your lab results.

## 2016-01-28 NOTE — Progress Notes (Signed)
Subjective:    KIRANA PLANAS - 55 y.o. female MRN [id number]  Date of birth: 24-Feb-1961  HPI  NITA LETSCHE is here for SDA for pedal edema.   LEG SWELLING  Having acute leg swelling for 3 days. Has had history of LE edema but this is the worst that its been. Has been moving recently and noticed the swelling. Has had trouble bending ankles at points because of swelling. Has had to buy men's flip flops because of amount of swelling.  Location: to shins  Timing of swelling: worsened at end of day and after being on feet  New medications: No  History of Kidney problems: No History of Heart problems: History of HTN. No known h/o CHF.  History of Liver problems: No  History of cancer: No  Has been compliant with Lisinopril but has not been taking HCTZ consistently for a year or two due to frequent urination. Keeps them on and hand but only takes them occasionally.   Symptoms Chest pain: No  Shortness of breath: Yes. +DOE and PND. Sleeps on at least 5-6 pillows per night.   Immobility: No  Black or bloody bowel movements: No Severe snoring or day time sleepiness: Yes. Has history of OSA and is non-compliant with CPAP.  Abdomen swelling: No History of blood clots: No  Weight Loss: No   Patient believes may be caused by weight and staying on feet.    -  reports that she has never smoked. She has never used smokeless tobacco. - Review of Systems: Per HPI. - Past Medical History: Patient Active Problem List   Diagnosis Date Noted  . Lower extremity edema 01/28/2016  . Allergic rhinitis 02/06/2015  . GERD (gastroesophageal reflux disease) 02/06/2015  . Severe persistent asthma 02/06/2015  . Intertrigo 10/22/2014  . Abdominal pain 07/03/2014  . Depression, major, recurrent ([place]) 02/19/2014  . Colon polyp 05/09/2012  . Diabetes mellitus type 2 in obese ([place]) 01/25/2011  . SLEEP APNEA 03/10/2009  . LOW BACK PAIN SYNDROME 11/26/2006  . HYPERCHOLESTEROLEMIA 08/02/2006  .  Morbid obesity ([place]) 08/02/2006  . ANXIETY 08/02/2006  . PSEUDOTUMOR CEREBRI 08/02/2006  . HYPERTENSION, BENIGN SYSTEMIC 08/02/2006  . OSTEOARTHRITIS, LOWER LEG 08/02/2006  . FIBROMYALGIA, FIBROMYOSITIS 08/02/2006   - Medications: reviewed and updated    Objective:   Physical Exam BP (!) 150/115 (BP Location: Right Arm, Patient Position: Sitting, Cuff Size: Normal)   Pulse 92   Temp 98.1 F (36.7 C) (Oral)   Wt (!) 420 lb 12.8 oz (190.9 kg)   BMI 68.96 kg/m  Gen: NAD, alert, cooperative with exam, morbidly obese  CV: RRR, good S1/S2, no murmur, 2+ pitting edema to knees bilaterally, negative Homan's sign and calf squeeze test bilaterally  Resp: CTABL, no wheezes or crackles, non-labored  Assessment & Plan:   Lower extremity edema Likely venous stasis secondary to morbid obesity given improvement with elevation and worsening of dependent edema with increased periods of time on her feet. However, symptoms of orthopnea, DOE, and PND are concerning for CHF and warrant further work up. Patient has no previous echo on file. Weight has been stable over past year so low concern for severe volume overload. Will also rule out anemia and thyroid disease as cause of symptoms.  Low suspicion for DVT as cause of bilateral LE edema.  -BNP ordered today, if elevated would obtain echo  -TSH and CBC ordered today  -patient to resume taking HCTZ  -patient to keep feet elevated as much  as possible  -elected not to order compression stockings given body habitus and severe difficulty with putting them on  -follow up with PCP     Phill Myron, D.O. 01/28/2016, 1:46 PM PGY-2, [place]

## 2016-01-29 LAB — TSH: TSH: 0.61 mIU/L

## 2016-01-29 LAB — BRAIN NATRIURETIC PEPTIDE: Brain Natriuretic Peptide: 27.1 pg/mL (ref <100)

## 2016-01-31 ENCOUNTER — Ambulatory Visit: Payer: Medicaid Other

## 2016-01-31 ENCOUNTER — Ambulatory Visit (INDEPENDENT_AMBULATORY_CARE_PROVIDER_SITE_OTHER): Payer: Medicaid Other

## 2016-01-31 DIAGNOSIS — J4540 Moderate persistent asthma, uncomplicated: Secondary | ICD-10-CM

## 2016-02-28 ENCOUNTER — Ambulatory Visit: Payer: Self-pay | Admitting: *Deleted

## 2016-02-28 DIAGNOSIS — J4540 Moderate persistent asthma, uncomplicated: Secondary | ICD-10-CM | POA: Diagnosis not present

## 2016-03-27 ENCOUNTER — Ambulatory Visit (INDEPENDENT_AMBULATORY_CARE_PROVIDER_SITE_OTHER): Payer: Medicaid Other | Admitting: *Deleted

## 2016-03-27 ENCOUNTER — Other Ambulatory Visit: Payer: Self-pay | Admitting: Family Medicine

## 2016-03-27 DIAGNOSIS — J4540 Moderate persistent asthma, uncomplicated: Secondary | ICD-10-CM | POA: Diagnosis not present

## 2016-03-29 ENCOUNTER — Encounter: Payer: Self-pay | Admitting: Family Medicine

## 2016-03-29 ENCOUNTER — Ambulatory Visit (INDEPENDENT_AMBULATORY_CARE_PROVIDER_SITE_OTHER): Payer: Medicaid Other | Admitting: Family Medicine

## 2016-03-29 VITALS — BP 124/67 | HR 85 | Temp 98.4°F | Ht 65.5 in | Wt >= 6400 oz

## 2016-03-29 DIAGNOSIS — Z23 Encounter for immunization: Secondary | ICD-10-CM | POA: Diagnosis not present

## 2016-03-29 DIAGNOSIS — F331 Major depressive disorder, recurrent, moderate: Secondary | ICD-10-CM

## 2016-03-29 DIAGNOSIS — M940 Chondrocostal junction syndrome [Tietze]: Secondary | ICD-10-CM | POA: Diagnosis not present

## 2016-03-29 DIAGNOSIS — I10 Essential (primary) hypertension: Secondary | ICD-10-CM | POA: Diagnosis not present

## 2016-03-29 DIAGNOSIS — E669 Obesity, unspecified: Secondary | ICD-10-CM

## 2016-03-29 DIAGNOSIS — E1169 Type 2 diabetes mellitus with other specified complication: Secondary | ICD-10-CM

## 2016-03-29 LAB — POCT GLYCOSYLATED HEMOGLOBIN (HGB A1C): Hemoglobin A1C: 6.6

## 2016-03-29 NOTE — Patient Instructions (Signed)
Please get your pap smear and have your physician send me a copy of the report.   You will get a flu shot today. I hope you find the time to do more exercise/activity.   Of course, the big issue remains the weight.   See me in six months.

## 2016-03-30 DIAGNOSIS — M940 Chondrocostal junction syndrome [Tietze]: Secondary | ICD-10-CM | POA: Insufficient documentation

## 2016-03-30 NOTE — Progress Notes (Signed)
   Subjective:    Patient ID: Kelsey Bullock, female    DOB: 1960/11/27, 55 y.o.   MRN: [id number]  HPI Patient complains of pain all over.  Some chest soreness, anterior midline.  No SOB.   Stress is less - although this brings an element of sadness.  Two of her three children are no longer living with her.  Only Kelsey Bullock, her youngest.  He is doing well.  She has moved into a new apartment.    She is sore from unpacking boxes.  There is no exercise facility in her apartment complex.   States that she continues to eat carefully.  She is officially diabetic but currently "diet controled"  A1C today  At goal    Review of Systems     Objective:   Physical Exam Lungs clear Marked pain at left costo sternal border. Cardiac RRR without m or g Ext symetric edema.       Assessment & Plan:

## 2016-03-30 NOTE — Assessment & Plan Note (Signed)
Reassurance 

## 2016-03-30 NOTE — Assessment & Plan Note (Signed)
Wt stable,  Emphasized exercise.

## 2016-03-30 NOTE — Assessment & Plan Note (Signed)
At goal on current meds. 

## 2016-03-30 NOTE — Assessment & Plan Note (Signed)
Stable on current meds.  Her life is simpler and more lonely.

## 2016-04-13 ENCOUNTER — Other Ambulatory Visit: Payer: Self-pay | Admitting: Allergy and Immunology

## 2016-04-17 ENCOUNTER — Other Ambulatory Visit: Payer: Self-pay | Admitting: *Deleted

## 2016-04-17 MED ORDER — OLOPATADINE HCL 0.1 % OP SOLN: 1.0000 [drp] | Freq: Two times a day (BID) | OPHTHALMIC | 12 refills | Status: DC

## 2016-05-05 ENCOUNTER — Other Ambulatory Visit: Payer: Self-pay | Admitting: Family Medicine

## 2016-05-05 ENCOUNTER — Telehealth: Payer: Self-pay | Admitting: Allergy and Immunology

## 2016-05-05 ENCOUNTER — Other Ambulatory Visit: Payer: Self-pay

## 2016-05-05 DIAGNOSIS — Z1231 Encounter for screening mammogram for malignant neoplasm of breast: Secondary | ICD-10-CM

## 2016-05-05 MED ORDER — ALBUTEROL SULFATE (2.5 MG/3ML) 0.083% IN NEBU: INHALATION_SOLUTION | RESPIRATORY_TRACT | 0 refills | Status: DC

## 2016-05-05 MED ORDER — EPINEPHRINE 0.3 MG/0.3ML IJ SOAJ: 0.3000 mg | Freq: Once | INTRAMUSCULAR | 3 refills | Status: DC

## 2016-05-05 NOTE — Telephone Encounter (Signed)
Pt called and needs to have albuterol nebulize solution and epi-pen called into [facility] [address] .

## 2016-05-05 NOTE — Telephone Encounter (Signed)
Called patient and advised her that we send in her refills and requested that she call back and make an follow up appointment.

## 2016-05-09 ENCOUNTER — Ambulatory Visit (INDEPENDENT_AMBULATORY_CARE_PROVIDER_SITE_OTHER): Payer: Medicaid Other | Admitting: Allergy and Immunology

## 2016-05-09 ENCOUNTER — Encounter (INDEPENDENT_AMBULATORY_CARE_PROVIDER_SITE_OTHER): Payer: Self-pay

## 2016-05-09 ENCOUNTER — Encounter: Payer: Self-pay | Admitting: Allergy and Immunology

## 2016-05-09 VITALS — BP 152/88 | HR 87 | Temp 98.4°F | Resp 20 | Ht 63.75 in | Wt >= 6400 oz

## 2016-05-09 DIAGNOSIS — J309 Allergic rhinitis, unspecified: Secondary | ICD-10-CM

## 2016-05-09 DIAGNOSIS — K219 Gastro-esophageal reflux disease without esophagitis: Secondary | ICD-10-CM | POA: Diagnosis not present

## 2016-05-09 DIAGNOSIS — J45901 Unspecified asthma with (acute) exacerbation: Secondary | ICD-10-CM

## 2016-05-09 DIAGNOSIS — J0110 Acute frontal sinusitis, unspecified: Secondary | ICD-10-CM | POA: Diagnosis not present

## 2016-05-09 DIAGNOSIS — J019 Acute sinusitis, unspecified: Secondary | ICD-10-CM | POA: Insufficient documentation

## 2016-05-09 DIAGNOSIS — E669 Obesity, unspecified: Secondary | ICD-10-CM

## 2016-05-09 DIAGNOSIS — E1169 Type 2 diabetes mellitus with other specified complication: Secondary | ICD-10-CM

## 2016-05-09 MED ORDER — ALBUTEROL SULFATE (2.5 MG/3ML) 0.083% IN NEBU
INHALATION_SOLUTION | RESPIRATORY_TRACT | 2 refills | Status: DC
Start: 2016-05-09 — End: 2017-07-31

## 2016-05-09 MED ORDER — METHYLPREDNISOLONE ACETATE 80 MG/ML IJ SUSP: 80.0000 mg | Freq: Once | INTRAMUSCULAR | 0 refills | Status: AC

## 2016-05-09 MED ORDER — PREDNISONE 1 MG PO TABS: 10.0000 mg | ORAL_TABLET | Freq: Every day | ORAL | Status: DC

## 2016-05-09 MED ORDER — METHYLPREDNISOLONE ACETATE 80 MG/ML IJ SUSP
80.0000 mg | Freq: Once | INTRAMUSCULAR | Status: AC
  Administered 2016-05-09: 80 mg via INTRAMUSCULAR

## 2016-05-09 NOTE — Assessment & Plan Note (Signed)
   Continue appropriate allergen avoidance measures, montelukast 10 mg daily bedtime, and fluticasone nasal spray.

## 2016-05-09 NOTE — Progress Notes (Signed)
Follow-up Note  RE: Kelsey Bullock MRN: [id number] DOB: [birth year] Date of Office Visit: 05/09/2016  Primary care provider: Zigmund Gottron, MD Referring provider: [name], MD  History of present illness: Kelsey Bullock is a 55 y.o. female with persistent asthma, allergic rhinoconjunctivitis, and gastroesophageal reflux disease presenting today for a sick visit.  She was last seen in this clinic on 11/02/2015.  She reports that over the past week she has been experiencing progressively increasing sinus pressure, especially over the forehead, nasal congestion, rhinorrhea, and thick postnasal drainage.  She denies fevers, chills, or discolored mucus production.  In addition, she has been experiencing frequent coughing, chest tightness, dyspnea, and wheezing.  These lower respiratory symptoms have been occurring throughout the day and have been awakening her from her sleep at nighttime.  She currently takes omalizumab injections as well as Dulera 200/5 g, 2 inhalations via spacer device once a day, and montelukast 10 mg daily at bedtime.   Assessment and plan: Asthma with acute exacerbation  Depo-Medrol 80 mg was administered in the office.  Prednisone has been provided and is to be started tomorrow as follows: 20 mg daily x 4 days, 10 mg x1 day, then stop.  Increase Dulera 200/5 g to 2 inhalations via spacer device twice a day.  During respiratory tract infections or asthma flares, add Qvar 80 g   to 2 inhalations 2 times per day until symptoms have returned to baseline.  Continue omalizumab injections as prescribed, montelukast 10 mg daily, and albuterol HFA, 1-2 inhalations every 4-6 hours as needed.  The patient has been asked to contact me if her symptoms persist or progress. Otherwise, she may return for follow up in 4 months.  Acute sinusitis  Depo-Medrol and prednisone have been provided (as above).  Increase fluticasone nasal spray to 2 sprays per  nostril twice daily for now.  Nasal saline lavage (NeilMed) has been recommended prior to medicated nasal sprays and as needed along with instructions for proper administration.  For thick post nasal drainage, add guaifenesin 1200 mg (Mucinex Maximum Strength)  twice daily as needed with adequate hydration as discussed.  The patient has been asked to contact me if her symptoms persist, progress, or if she becomes febrile.   Allergic rhinitis  Continue appropriate allergen avoidance measures, montelukast 10 mg daily bedtime, and fluticasone nasal spray.  GERD (gastroesophageal reflux disease)  Continue appropriate reflux lifestyle modifications and omeprazole 40 mg daily.  Diabetes mellitus type 2  The patient has been asked to carefully monitor blood glucose levels while on prednisone.  She has verbalized understanding and has agreed to do so.   Meds ordered this encounter  Medications  . predniSONE (DELTASONE) tablet 10 mg  . methylPREDNISolone acetate (DEPO-MEDROL) 80 MG/ML injection    Sig: Inject 1 mL (80 mg total) into the muscle once.    Dispense:  1 mL    Refill:  0  . methylPREDNISolone acetate (DEPO-MEDROL) injection 80 mg  . albuterol (PROVENTIL) (2.5 MG/3ML) 0.083% nebulizer solution    Sig: USE 1 UNIT IN NEBULIZER EVERY 4 TO 6 HOURS AS NEEDED FOR COUGH OR WHEEZE.    Dispense:  75 mL    Refill:  2    **Patient requests 90 days supply**    Diagnostics: Spirometry reveals an FVC of 1.72 L and an FEV1 of 1.52 L (60% predicted) without significant post bronchodilator improvement.  Restrictive pattern is most likely due to body habitus.  Please see scanned spirometry results  for details.    Physical examination: Blood pressure (!) 152/88, pulse 87, temperature 98.4 F (36.9 C), temperature source Oral, resp. rate 20, height 5' 3.75" (1.619 m), weight (!) 422 lb 9.6 oz (191.7 kg), SpO2 91 %.  General: Alert, interactive, obese, in no acute distress. HEENT: TMs pearly  gray, turbinates edematous with thick discharge, post-pharynx crowded. Neck: Supple without lymphadenopathy. Lungs: Mildly decreased breath sounds bilaterally without wheezing, rhonchi or rales. CV: Normal S1, S2 without murmurs. Skin: Warm and dry, without lesions or rashes.  The following portions of the patient's history were reviewed and updated as appropriate: allergies, current medications, past family history, past medical history, past social history, past surgical history and problem list.    Medication List       Accurate as of 05/09/16  7:43 PM. Always use your most recent med list.          aspirin 81 MG EC tablet Take 1 tablet (81 mg total) by mouth daily. Swallow whole.   buPROPion 100 MG tablet Commonly known as:  WELLBUTRIN   calcium carbonate 1250 MG capsule Take 1,250 mg by mouth 2 (two) times daily with a meal. Reported on 11/02/2015   cetirizine 10 MG tablet Commonly known as:  ZYRTEC Take 1 tablet by mouth daily.   clonazePAM 1 MG tablet Commonly known as:  KLONOPIN Take 1 mg by mouth 2 (two) times daily.   cyclobenzaprine 10 MG tablet Commonly known as:  FLEXERIL TAKE 1 TABLET BY MOUTH AT BEDTIME AS NEEDED FOR BACK OR NECK PAIN OR SPASMS   Echinacea 125 MG Caps Take 1 capsule by mouth daily. Reported on 11/02/2015   EPINEPHrine 0.3 mg/0.3 mL Soaj injection Commonly known as:  EPIPEN 2-PAK Inject 0.3 mLs (0.3 mg total) into the muscle once.   famotidine 20 MG tablet Commonly known as:  PEPCID TAKE 1 TABLET BY MOUTH EVERY DAY   FLUoxetine 10 MG capsule Commonly known as:  PROZAC Take 10 mg by mouth daily.   fluticasone 50 MCG/ACT nasal spray Commonly known as:  FLONASE USE 2 SPRAYS IN EACH NOSTRIL ONCE DAILY FOR STUFFY NOSE OR DRAINAGE   hydrochlorothiazide 25 MG tablet Commonly known as:  HYDRODIURIL TAKE 1 TABLET BY MOUTH EVERY DAY   ibuprofen 800 MG tablet Commonly known as:  ADVIL,MOTRIN TAKE 1 TABLET BY MOUTH THREE TIMES DAILY AS  NEEDED FOR PAIN   ipratropium 0.06 % nasal spray Commonly known as:  ATROVENT USE 2 SPRAYS IN EACH NOSTRIL EVERY 6 HOURS IF NEEDED TO DRY UP NOSE   lisinopril 40 MG tablet Commonly known as:  PRINIVIL,ZESTRIL TAKE 1 TABLET BY MOUTH EVERY DAY   magnesium (amino acid chelate) 133 MG tablet Take 1 tablet by mouth 2 (two) times daily. Reported on 11/02/2015   methylPREDNISolone acetate 80 MG/ML injection Commonly known as:  DEPO-MEDROL Inject 1 mL (80 mg total) into the muscle once.   mirtazapine 45 MG tablet Commonly known as:  REMERON   mometasone-formoterol 200-5 MCG/ACT Aero Commonly known as:  DULERA Inhale 2 puffs into the lungs 2 (two) times daily.   montelukast 10 MG tablet Commonly known as:  SINGULAIR Take 1 tablet (10 mg total) by mouth daily.   norethindrone 5 MG tablet Commonly known as:  AYGESTIN Take 2.5 mg by mouth daily.   nystatin powder Commonly known as:  MYCOSTATIN/NYSTOP Apply topically 3 (three) times daily.   olopatadine 0.1 % ophthalmic solution Commonly known as:  PATANOL Place 1 drop into both eyes  2 (two) times daily.   omalizumab 150 MG injection Commonly known as:  XOLAIR Inject 300 mg into the skin every 28 (twenty-eight) days.   omeprazole 40 MG capsule Commonly known as:  PRILOSEC Take 40 mg by mouth daily. Reported on 08/03/2015   PROAIR HFA 108 (90 Base) MCG/ACT inhaler Generic drug:  albuterol INHALE 2 PUFFS INTO THE LUNGS EVERY 4 HOURS AS NEEDED FOR WHEEZING   albuterol (2.5 MG/3ML) 0.083% nebulizer solution Commonly known as:  PROVENTIL USE 1 UNIT IN NEBULIZER EVERY 4 TO 6 HOURS AS NEEDED FOR COUGH OR WHEEZE.   simvastatin 80 MG tablet Commonly known as:  ZOCOR TAKE 1 TABLET BY MOUTH EVERY NIGHT AT BEDTIME   traZODone 100 MG tablet Commonly known as:  DESYREL Take 100 mg by mouth at bedtime.   triamcinolone cream 0.1 % Commonly known as:  KENALOG APPLY TO THE AFFECTED AREA TWICE DAILY.   vitamin C 100 MG tablet Take  100 mg by mouth daily. Reported on 08/03/2015       Allergies  Allergen Reactions  . Etodolac Shortness Of Breath and Anaphylaxis    REACTION: lips swelled  . Penicillins Swelling and Anaphylaxis  . Amoxicillin     REACTION: unspecified  . Augmentin [Amoxicillin-Pot Clavulanate]   . Diclofenac Sodium     REACTION: unspecified  . Iodine     REACTION: unspecified  . Latex   . Omeprazole     REACTION: unspecified  . Other     Other reaction(s): Other (See Comments) Plastic Tape=redness  . Pantoprazole Sodium     REACTION: unspecified   Review of systems: Review of systems negative except as noted in HPI / PMHx or noted below: Constitutional: Negative.  HENT: Negative.   Eyes: Negative.  Respiratory: Negative.   Cardiovascular: Negative.  Gastrointestinal: Negative.  Genitourinary: Negative.  Musculoskeletal: Negative.  Neurological: Negative.  Endo/Heme/Allergies: Negative.  Cutaneous: Negative.  Past Medical History:  Diagnosis Date  . Allergic rhinitis   . Asthma   . Depression   . Gall stone   . GERD (gastroesophageal reflux disease)     Family History  Problem Relation Age of Onset  . Diabetes Mother   . Diabetes Paternal Uncle     Social History   Social History  . Marital status: Divorced    Spouse name: N/A  . Number of children: N/A  . Years of education: N/A   Occupational History  . Not on file.   Social History Main Topics  . Smoking status: Never Smoker  . Smokeless tobacco: Never Used  . Alcohol use No  . Drug use: No  . Sexual activity: Not Currently   Other Topics Concern  . Not on file   Social History Narrative  . No narrative on file    I appreciate the opportunity to take part in Kelsey Bullock's care. Please do not hesitate to contact me with questions.  Sincerely,   R. Edgar Frisk, MD

## 2016-05-09 NOTE — Patient Instructions (Addendum)
Asthma with acute exacerbation  Depo-Medrol 80 mg was administered in the office.  Prednisone has been provided and is to be started tomorrow as follows: 20 mg daily x 4 days, 10 mg x1 day, then stop.  Increase Dulera 200/5 g to 2 inhalations via spacer device twice a day.  During respiratory tract infections or asthma flares, add Qvar 80 g   to 2 inhalations 2 times per day until symptoms have returned to baseline.  Continue omalizumab injections as prescribed, montelukast 10 mg daily, and albuterol HFA, 1-2 inhalations every 4-6 hours as needed.  The patient has been asked to contact me if her symptoms persist or progress. Otherwise, she may return for follow up in 4 months.  Acute sinusitis  Depo-Medrol and prednisone have been provided (as above).  Increase fluticasone nasal spray to 2 sprays per nostril twice daily for now.  Nasal saline lavage (NeilMed) has been recommended prior to medicated nasal sprays and as needed along with instructions for proper administration.  For thick post nasal drainage, add guaifenesin 1200 mg (Mucinex Maximum Strength)  twice daily as needed with adequate hydration as discussed.  The patient has been asked to contact me if her symptoms persist, progress, or if she becomes febrile.   Allergic rhinitis  Continue appropriate allergen avoidance measures, montelukast 10 mg daily bedtime, and fluticasone nasal spray.  GERD (gastroesophageal reflux disease)  Continue appropriate reflux lifestyle modifications and omeprazole 40 mg daily.  Diabetes mellitus type 2  The patient has been asked to carefully monitor blood glucose levels while on prednisone.  She has verbalized understanding and has agreed to do so.   Return in about 4 months (around 09/07/2016), or if symptoms worsen or fail to improve.

## 2016-05-09 NOTE — Assessment & Plan Note (Signed)
   Depo-Medrol 80 mg was administered in the office.  Prednisone has been provided and is to be started tomorrow as follows: 20 mg daily x 4 days, 10 mg x1 day, then stop.  Increase Dulera 200/5 g to 2 inhalations via spacer device twice a day.  During respiratory tract infections or asthma flares, add Qvar 80 g  to 2 inhalations 2 times per day until symptoms have returned to baseline.  Continue omalizumab injections as prescribed, montelukast 10 mg daily, and albuterol HFA, 1-2 inhalations every 4-6 hours as needed.  The patient has been asked to contact me if her symptoms persist or progress. Otherwise, she may return for follow up in 4 months.

## 2016-05-09 NOTE — Assessment & Plan Note (Signed)
   The patient has been asked to carefully monitor blood glucose levels while on prednisone.  She has verbalized understanding and has agreed to do so. 

## 2016-05-09 NOTE — Assessment & Plan Note (Signed)
   Depo-Medrol and prednisone have been provided (as above).  Increase fluticasone nasal spray to 2 sprays per nostril twice daily for now.  Nasal saline lavage (NeilMed) has been recommended prior to medicated nasal sprays and as needed along with instructions for proper administration.  For thick post nasal drainage, add guaifenesin 1200 mg (Mucinex Maximum Strength)  twice daily as needed with adequate hydration as discussed.  The patient has been asked to contact me if her symptoms persist, progress, or if she becomes febrile.

## 2016-05-09 NOTE — Assessment & Plan Note (Signed)
   Continue appropriate reflux lifestyle modifications and omeprazole 40 mg daily.

## 2016-05-19 ENCOUNTER — Ambulatory Visit: Payer: Medicaid Other | Admitting: *Deleted

## 2016-05-30 ENCOUNTER — Ambulatory Visit
Admission: RE | Admit: 2016-05-30 | Discharge: 2016-05-30 | Disposition: A | Discharge status: Home / Self Care | Payer: Medicaid Other | Source: Ambulatory Visit | Attending: Family Medicine | Admitting: Family Medicine

## 2016-05-30 DIAGNOSIS — Z1231 Encounter for screening mammogram for malignant neoplasm of breast: Secondary | ICD-10-CM

## 2016-06-16 ENCOUNTER — Other Ambulatory Visit: Payer: Self-pay | Admitting: Allergy and Immunology

## 2016-06-16 ENCOUNTER — Ambulatory Visit: Payer: Medicaid Other

## 2016-06-16 ENCOUNTER — Other Ambulatory Visit: Payer: Self-pay | Admitting: Family Medicine

## 2016-06-20 ENCOUNTER — Ambulatory Visit (INDEPENDENT_AMBULATORY_CARE_PROVIDER_SITE_OTHER): Payer: Medicaid Other | Admitting: *Deleted

## 2016-06-20 ENCOUNTER — Ambulatory Visit: Payer: Medicaid Other

## 2016-06-20 DIAGNOSIS — J4540 Moderate persistent asthma, uncomplicated: Secondary | ICD-10-CM

## 2016-06-28 ENCOUNTER — Ambulatory Visit: Payer: Medicaid Other | Admitting: Allergy and Immunology

## 2016-07-21 ENCOUNTER — Ambulatory Visit: Payer: Medicaid Other | Admitting: *Deleted

## 2016-07-21 DIAGNOSIS — J4540 Moderate persistent asthma, uncomplicated: Secondary | ICD-10-CM | POA: Diagnosis not present

## 2016-08-03 ENCOUNTER — Encounter (INDEPENDENT_AMBULATORY_CARE_PROVIDER_SITE_OTHER): Payer: Self-pay

## 2016-08-03 ENCOUNTER — Ambulatory Visit (INDEPENDENT_AMBULATORY_CARE_PROVIDER_SITE_OTHER): Payer: Medicaid Other | Admitting: Allergy

## 2016-08-03 VITALS — BP 128/74 | HR 112 | Temp 99.8°F | Resp 20 | Ht 64.5 in | Wt >= 6400 oz

## 2016-08-03 DIAGNOSIS — J0110 Acute frontal sinusitis, unspecified: Secondary | ICD-10-CM | POA: Diagnosis not present

## 2016-08-03 DIAGNOSIS — J4551 Severe persistent asthma with (acute) exacerbation: Secondary | ICD-10-CM | POA: Diagnosis not present

## 2016-08-03 DIAGNOSIS — E1169 Type 2 diabetes mellitus with other specified complication: Secondary | ICD-10-CM

## 2016-08-03 DIAGNOSIS — B370 Candidal stomatitis: Secondary | ICD-10-CM | POA: Diagnosis not present

## 2016-08-03 DIAGNOSIS — E669 Obesity, unspecified: Secondary | ICD-10-CM

## 2016-08-03 DIAGNOSIS — K219 Gastro-esophageal reflux disease without esophagitis: Secondary | ICD-10-CM | POA: Diagnosis not present

## 2016-08-03 MED ORDER — NYSTATIN 100000 UNIT/ML MT SUSP: 5.0000 mL | Freq: Four times a day (QID) | OROMUCOSAL | 0 refills | Status: DC

## 2016-08-03 MED ORDER — METHYLPREDNISOLONE ACETATE 80 MG/ML IJ SUSP
80.0000 mg | Freq: Once | INTRAMUSCULAR | Status: AC
  Administered 2016-08-03: 80 mg via INTRAMUSCULAR

## 2016-08-03 MED ORDER — OLOPATADINE HCL 0.1 % OP SOLN: 1.0000 [drp] | Freq: Two times a day (BID) | OPHTHALMIC | 5 refills | Status: DC

## 2016-08-03 MED ORDER — OSELTAMIVIR PHOSPHATE 75 MG PO CAPS: 75.0000 mg | ORAL_CAPSULE | Freq: Two times a day (BID) | ORAL | 0 refills | Status: DC

## 2016-08-03 NOTE — Progress Notes (Signed)
Follow-up Note  RE: Kelsey Bullock MRN: [id number] DOB: 06-10-1960 Date of Office Visit: 08/03/2016   History of present illness: Kelsey Bullock is a 56 y.o. female presenting today for sick visit.   She was last seen by Dr. Verlin Fester on 05/09/16 for asthma exacerbation and frontal sinusitis.  She was treated at that visit with steroids, Mucinex and continued on her routine asthma management.  She reports she was doing relatively well until 2 days ago.  She states her sister had PNA recently and she has a highschool aged son.  She reports coughing with yellow sputum as well as wheezing and SOB.  She has needed to use her albuterol nebulizers the past 2 days with minimal relief and reports having nighttime awakenings.  She also is having yellowish nasal drainage and pain/pressure in her forehead area.  She also reports increased body aches she is very fatigued.  She also notes white patches on her tongue. She has not taken her temperature at home but has felt slightly warm. She has continued on her routine Dulera has added Qvar into her regimen with current symptoms. She continues on Xolair injections and her last injection was this past Friday. Chest continues on Singulair. For her sinus symptoms she takes Flonase that she does 2 sprays daily. She has used saline rinses in the past but states she has not started this back up with this current illness. She also has not started on Mucinex this illness.     Review of systems: Review of Systems  Constitutional: Positive for diaphoresis and malaise/fatigue. Negative for chills and fever.  HENT: Positive for congestion, sinus pain and sore throat. Negative for ear pain and nosebleeds.   Eyes: Negative for discharge and redness.  Respiratory: Positive for cough, sputum production, shortness of breath and wheezing.   Cardiovascular: Negative for chest pain.  Gastrointestinal: Positive for heartburn. Negative for abdominal pain, diarrhea, nausea and  vomiting.  Musculoskeletal: Positive for myalgias.  Skin: Negative for itching and rash.  Neurological: Positive for headaches.    All other systems negative unless noted above in HPI  Past medical/social/surgical/family history have been reviewed and are unchanged unless specifically indicated below.  No changes  Medication List: Allergies as of 08/03/2016      Reactions   Etodolac Shortness Of Breath, Anaphylaxis   REACTION: lips swelled   Penicillins Swelling, Anaphylaxis   Amoxicillin    REACTION: unspecified   Augmentin [amoxicillin-pot Clavulanate]    Diclofenac Sodium    REACTION: unspecified   Iodine    REACTION: unspecified   Latex    Omeprazole    REACTION: unspecified   Other    Other reaction(s): Other (See Comments) Plastic Tape=redness   Pantoprazole Sodium    REACTION: unspecified      Medication List       Accurate as of 08/03/16  6:12 PM. Always use your most recent med list.          aspirin 81 MG EC tablet Take 1 tablet (81 mg total) by mouth daily. Swallow whole.   buPROPion 100 MG tablet Commonly known as:  WELLBUTRIN   calcium carbonate 1250 MG capsule Take 1,250 mg by mouth 2 (two) times daily with a meal. Reported on 11/02/2015   cetirizine 10 MG tablet Commonly known as:  ZYRTEC TAKE 1 TABLET BY MOUTH EVERY DAY FOR RUNNY NOSE OR ITCHING   clonazePAM 1 MG tablet Commonly known as:  KLONOPIN Take 1 mg by mouth 2 (  two) times daily.   cyclobenzaprine 10 MG tablet Commonly known as:  FLEXERIL TAKE 1 TABLET BY MOUTH AT BEDTIME AS NEEDED FOR BACK OR NECK PAIN OR SPASMS   DULERA 200-5 MCG/ACT Aero Generic drug:  mometasone-formoterol INHALE 2 PUFFS INTO THE LUNGS TWICE DAILY   Echinacea 125 MG Caps Take 1 capsule by mouth daily. Reported on 11/02/2015   EPINEPHrine 0.3 mg/0.3 mL Soaj injection Commonly known as:  EPIPEN 2-PAK Inject 0.3 mLs (0.3 mg total) into the muscle once.   famotidine 20 MG tablet Commonly known as:   PEPCID TAKE 1 TABLET BY MOUTH EVERY DAY   FLUoxetine 10 MG capsule Commonly known as:  PROZAC Take 10 mg by mouth daily.   fluticasone 50 MCG/ACT nasal spray Commonly known as:  FLONASE USE 2 SPRAYS IN EACH NOSTRIL ONCE DAILY FOR STUFFY NOSE OR DRAINAGE   hydrochlorothiazide 25 MG tablet Commonly known as:  HYDRODIURIL TAKE 1 TABLET BY MOUTH EVERY DAY   ibuprofen 800 MG tablet Commonly known as:  ADVIL,MOTRIN TAKE 1 TABLET BY MOUTH THREE TIMES DAILY AS NEEDED FOR PAIN   ipratropium 0.06 % nasal spray Commonly known as:  ATROVENT USE 2 SPRAYS IN EACH NOSTRIL EVERY 6 HOURS IF NEEDED TO DRY UP NOSE   lisinopril 40 MG tablet Commonly known as:  PRINIVIL,ZESTRIL TAKE 1 TABLET BY MOUTH EVERY DAY   magnesium (amino acid chelate) 133 MG tablet Take 1 tablet by mouth 2 (two) times daily. Reported on 11/02/2015   mirtazapine 45 MG tablet Commonly known as:  REMERON   montelukast 10 MG tablet Commonly known as:  SINGULAIR TAKE 1 TABLET(10 MG) BY MOUTH DAILY   norethindrone 5 MG tablet Commonly known as:  AYGESTIN Take 2.5 mg by mouth daily.   nystatin 100000 UNIT/ML suspension Commonly known as:  MYCOSTATIN Take 5 mLs (500,000 Units total) by mouth 4 (four) times daily.   nystatin powder Commonly known as:  MYCOSTATIN/NYSTOP Apply topically 3 (three) times daily.   olopatadine 0.1 % ophthalmic solution Commonly known as:  PATANOL Place 1 drop into both eyes 2 (two) times daily.   omalizumab 150 MG injection Commonly known as:  XOLAIR Inject 300 mg into the skin every 28 (twenty-eight) days.   omeprazole 40 MG capsule Commonly known as:  PRILOSEC Take 40 mg by mouth daily. Reported on 08/03/2015   oseltamivir 75 MG capsule Commonly known as:  TAMIFLU Take 1 capsule (75 mg total) by mouth 2 (two) times daily.   PROAIR HFA 108 (90 Base) MCG/ACT inhaler Generic drug:  albuterol INHALE 2 PUFFS INTO THE LUNGS EVERY 4 HOURS AS NEEDED FOR WHEEZING   albuterol (2.5  MG/3ML) 0.083% nebulizer solution Commonly known as:  PROVENTIL USE 1 UNIT IN NEBULIZER EVERY 4 TO 6 HOURS AS NEEDED FOR COUGH OR WHEEZE.   QVAR 80 MCG/ACT inhaler Generic drug:  beclomethasone INHALE 2 PUFFS BY MOUTH INTO THE LUNGS TWICE DAILY   simvastatin 80 MG tablet Commonly known as:  ZOCOR TAKE 1 TABLET BY MOUTH EVERY NIGHT AT BEDTIME   traZODone 100 MG tablet Commonly known as:  DESYREL Take 100 mg by mouth at bedtime.   triamcinolone cream 0.1 % Commonly known as:  KENALOG APPLY EXTERNALLY TO THE AFFECTED AREA TWICE DAILY   vitamin C 100 MG tablet Take 100 mg by mouth daily. Reported on 08/03/2015       Known medication allergies: Allergies  Allergen Reactions  . Etodolac Shortness Of Breath and Anaphylaxis    REACTION: lips  swelled  . Penicillins Swelling and Anaphylaxis  . Amoxicillin     REACTION: unspecified  . Augmentin [Amoxicillin-Pot Clavulanate]   . Diclofenac Sodium     REACTION: unspecified  . Iodine     REACTION: unspecified  . Latex   . Omeprazole     REACTION: unspecified  . Other     Other reaction(s): Other (See Comments) Plastic Tape=redness  . Pantoprazole Sodium     REACTION: unspecified     Physical examination: Blood pressure 128/74, pulse (!) 112, temperature 99.8 F (37.7 C), temperature source Oral, resp. rate 20, height 5' 4.5" (1.638 m), weight (!) 426 lb (193.2 kg), SpO2 95 %.  General: Alert, interactive, in no acute distress. obese HEENT: TMs pearly gray, turbinates moderately edematous with thick discharge, post-pharynx mildly erythematous without exudate. Tongue does have white patches that are not able to scrape Neck: Supple without lymphadenopathy. Lungs: Decreased breath sounds with expiratory wheezing bilaterally. Breath sounds are distant due to habitus {no increased work of breathing. CV: Normal S1, S2 without murmurs. Abdomen: Nondistended, nontender. Skin: Warm and dry, without lesions or rashes. Extremities:   No clubbing, cyanosis or edema. Neuro:   Grossly intact.  Diagnositics/Labs:  Spirometry: Deferred due to flulike symptoms  Assessment and plan:   Asthma with acute exacerbation  Depo-Medrol 80 mg was administered in the office.  Prednisone has been provided and is to be started tomorrow as follows: 20 mg daily x 3 days, 20mg  x1 day, 10mg  x 1 dayt then stop.  Dulera 200/5 g  2 inhalations via spacer device twice a day.  During respiratory tract infections or asthma flares, add Qvar 80 g  to 2 inhalations 2 times per day until symptoms have returned to baseline.  Concerned for possible flu with current symptoms and will prescribe Tamiflu 75mg  twice a day x 5 days  Continue omalizumab injections as prescribed, montelukast 10 mg daily, and albuterol HFA, 1-2 inhalations every 4-6 hours as needed.  The patient has been asked to contact the office if her symptoms persist or progress. Otherwise, she may return for follow up in 4 months.  Acute sinusitis  Depo-Medrol and prednisone have been provided (as above).  fluticasone nasal spray 2 sprays per nostril twice daily for now.  Nasal saline lavage (NeilMed) has been recommended prior to medicated nasal sprays and as needed along with instructions for proper administration.  For thick post nasal drainage, add guaifenesin 1200 mg (Mucinex Maximum Strength)  twice daily as needed with adequate hydration as discussed.  The patient has been asked to contact me if her symptoms persist, progress, or if she becomes febrile.   Allergic rhinitis  Continue appropriate allergen avoidance measures, montelukast 10 mg daily bedtime, and fluticasone nasal spray.  GERD (gastroesophageal reflux disease)  Continue appropriate reflux lifestyle modifications and omeprazole 40 mg daily.  Diabetes mellitus type 2  The patient has been asked to carefully monitor blood glucose levels while on prednisone.  She has verbalized understanding and has  agreed to do so.  Thrush  Nystatin suspension 100,000 units  Take 73ml 4 times a day for next 7 days or until thrush has resolved  Return in about 4 months or if symptoms worsen or fail to improve.   I appreciate the opportunity to take part in Kelsey Bullock's care. Please do not hesitate to contact me with questions.  Sincerely,   Prudy Feeler, MD Allergy/Immunology [facility] [place] [facility] [place]

## 2016-08-03 NOTE — Patient Instructions (Addendum)
Asthma with acute exacerbation  Depo-Medrol 80 mg was administered in the office.  Prednisone has been provided and is to be started tomorrow as follows: 20 mg daily x 3 days, 20mg  x1 day, 10mg  x 1 dayt then stop.  Dulera 200/5 g  2 inhalations via spacer device twice a day.  During respiratory tract infections or asthma flares, add Qvar 80 g  to 2 inhalations 2 times per day until symptoms have returned to baseline.  Concerned for possible flu with current symptoms and will prescribe Tamiflu 75mg  twice a day x 5 days  Continue omalizumab injections as prescribed, montelukast 10 mg daily, and albuterol HFA, 1-2 inhalations every 4-6 hours as needed.  The patient has been asked to contact the office if her symptoms persist or progress. Otherwise, she may return for follow up in 4 months.  Acute sinusitis  Depo-Medrol and prednisone have been provided (as above).  fluticasone nasal spray 2 sprays per nostril twice daily for now.  Nasal saline lavage (NeilMed) has been recommended prior to medicated nasal sprays and as needed along with instructions for proper administration.  For thick post nasal drainage, add guaifenesin 1200 mg (Mucinex Maximum Strength)  twice daily as needed with adequate hydration as discussed.  The patient has been asked to contact me if her symptoms persist, progress, or if she becomes febrile.   Allergic rhinitis  Continue appropriate allergen avoidance measures, montelukast 10 mg daily bedtime, and fluticasone nasal spray.  GERD (gastroesophageal reflux disease)  Continue appropriate reflux lifestyle modifications and omeprazole 40 mg daily.  Diabetes mellitus type 2  The patient has been asked to carefully monitor blood glucose levels while on prednisone.  She has verbalized understanding and has agreed to do so.  Thrush  Nystatin suspension 100,000 units  Take 9ml 4 times a day for next 7 days or until thrush has resolved  Return in about 4  months or if symptoms worsen or fail to improve.

## 2016-08-04 MED ORDER — NYSTATIN 100000 UNIT/ML MT SUSP: 5.0000 mL | Freq: Four times a day (QID) | OROMUCOSAL | 0 refills | Status: DC

## 2016-08-04 MED ORDER — OSELTAMIVIR PHOSPHATE 75 MG PO CAPS: 75.0000 mg | ORAL_CAPSULE | Freq: Two times a day (BID) | ORAL | 0 refills | Status: DC

## 2016-08-04 NOTE — Addendum Note (Signed)
Addended by: Gara Kroner L on: 08/04/2016 09:20 AM   Modules accepted: Orders

## 2016-08-09 ENCOUNTER — Other Ambulatory Visit: Payer: Self-pay | Admitting: *Deleted

## 2016-08-09 MED ORDER — OMALIZUMAB 150 MG ~~LOC~~ SOLR: 300.0000 mg | SUBCUTANEOUS | 11 refills | Status: DC

## 2016-08-18 ENCOUNTER — Telehealth: Payer: Self-pay | Admitting: *Deleted

## 2016-08-18 ENCOUNTER — Ambulatory Visit (INDEPENDENT_AMBULATORY_CARE_PROVIDER_SITE_OTHER): Payer: Medicaid Other | Admitting: *Deleted

## 2016-08-18 DIAGNOSIS — J4540 Moderate persistent asthma, uncomplicated: Secondary | ICD-10-CM | POA: Diagnosis not present

## 2016-08-18 NOTE — Telephone Encounter (Signed)
PA submitted for Pataday.

## 2016-08-18 NOTE — Telephone Encounter (Signed)
Dr Neldon Mc patient was here for xolair she is asking if we could switch eye drop back to [place] dispense Patanol generic. States Pataday helps better. Please advise

## 2016-08-18 NOTE — Telephone Encounter (Signed)
Please provide pataday with PA.

## 2016-08-20 ENCOUNTER — Other Ambulatory Visit: Payer: Self-pay | Admitting: Allergy and Immunology

## 2016-09-15 ENCOUNTER — Ambulatory Visit (INDEPENDENT_AMBULATORY_CARE_PROVIDER_SITE_OTHER): Payer: Medicaid Other | Admitting: *Deleted

## 2016-09-15 ENCOUNTER — Ambulatory Visit (INDEPENDENT_AMBULATORY_CARE_PROVIDER_SITE_OTHER): Payer: Medicaid Other | Admitting: Internal Medicine

## 2016-09-15 ENCOUNTER — Encounter: Payer: Self-pay | Admitting: Internal Medicine

## 2016-09-15 VITALS — BP 110/60 | HR 102 | Temp 97.5°F | Ht 64.5 in | Wt >= 6400 oz

## 2016-09-15 DIAGNOSIS — J4540 Moderate persistent asthma, uncomplicated: Secondary | ICD-10-CM | POA: Diagnosis not present

## 2016-09-15 DIAGNOSIS — R600 Localized edema: Secondary | ICD-10-CM | POA: Diagnosis present

## 2016-09-15 NOTE — Progress Notes (Signed)
   [facility] Kerrin Mo, MD Phone: [phone]  Reason For Visit: SDA for Leg Swelling   #Chronic lower extremity edema. Patient states that this edema is painful and indicates that she has been struggling with her for several years. She also notes that her legs are slightly pink and wanted to make sure that she didn't have an infection. She indicates being short of breath in general. However she denies any worsening shortness of breath, any orthopnea, PND. She denies any bleeding in her stools or darkening stools.  Past Medical History Reviewed problem list.  Medications- reviewed and updated No additions to family history Social history- patient is a non-smoker  Objective: BP 110/60   Pulse (!) 102   Temp 97.5 F (36.4 C) (Oral)   Ht 5' 4.5" (1.638 m)   Wt (!) 426 lb 6.4 oz (193.4 kg)   SpO2 95%   BMI 72.06 kg/m  Gen: NAD, alert, cooperative with exam Cardio: regular rate and rhythm, S1S2 heard, no murmurs appreciated Pulm: clear to auscultation bilaterally, no wheezes, rhonchi or rales Extremities: 1+ pitting edema bilaterally, legs symmetrical, with 37 cm in circumference along the calf approximately, no calf pain, no Homans sign, negative for warmth or erythema  Assessment/Plan: See problem based a/p  Lower extremity edema Morbidly obese individual with bilateral leg swelling consistent with venous stasis. Previously checked by Dr. Juleen China in 2017 for cardiac function with a normal BMP. No signs or symptoms of fluid overload. Will obtain labs including BMP, BNP,  and CBC Discussed with patient elevation, compression stockings Follow-up with PCP as needed

## 2016-09-15 NOTE — Assessment & Plan Note (Signed)
Morbidly obese individual with bilateral leg swelling consistent with venous stasis. Previously checked by Dr. Juleen China in 2017 for cardiac function with a normal BMP. No signs or symptoms of fluid overload. Will obtain labs including BMP, BNP,  and CBC Discussed with patient elevation, compression stockings Follow-up with PCP as needed

## 2016-09-15 NOTE — Patient Instructions (Signed)

## 2016-09-20 LAB — BASIC METABOLIC PANEL
BUN/Creatinine Ratio: 13 (ref 9–23)
BUN: 9 mg/dL (ref 6–24)
CO2: 26 mmol/L (ref 18–29)
Calcium: 8.9 mg/dL (ref 8.7–10.2)
Chloride: 97 mmol/L (ref 96–106)
Creatinine, Ser: 0.68 mg/dL (ref 0.57–1.00)
GFR calc Af Amer: 115 mL/min/{1.73_m2} (ref >59)
GFR calc non Af Amer: 99 mL/min/{1.73_m2} (ref >59)
Glucose: 192 mg/dL — ABNORMAL HIGH (ref 65–99)
Potassium: 4.2 mmol/L (ref 3.5–5.2)
Sodium: 139 mmol/L (ref 134–144)

## 2016-09-20 LAB — CBC
Hematocrit: 42.4 % (ref 34.0–46.6)
Hemoglobin: 13.5 g/dL (ref 11.1–15.9)
MCH: 29.2 pg (ref 26.6–33.0)
MCHC: 31.8 g/dL (ref 31.5–35.7)
MCV: 92 fL (ref 79–97)
Platelets: 215 10*3/uL (ref 150–379)
RBC: 4.62 x10E6/uL (ref 3.77–5.28)
RDW: 14.3 % (ref 12.3–15.4)
WBC: 9.1 10*3/uL (ref 3.4–10.8)

## 2016-09-20 LAB — BRAIN NATRIURETIC PEPTIDE: BNP: 3.6 pg/mL (ref 0.0–100.0)

## 2016-09-21 ENCOUNTER — Encounter: Payer: Self-pay | Admitting: Internal Medicine

## 2016-09-21 ENCOUNTER — Encounter: Payer: Self-pay | Admitting: Family Medicine

## 2016-09-21 ENCOUNTER — Ambulatory Visit (INDEPENDENT_AMBULATORY_CARE_PROVIDER_SITE_OTHER): Payer: Medicaid Other | Admitting: Family Medicine

## 2016-09-21 VITALS — BP 114/70 | HR 86 | Temp 97.9°F | Ht 64.5 in | Wt >= 6400 oz

## 2016-09-21 DIAGNOSIS — M545 Low back pain, unspecified: Secondary | ICD-10-CM

## 2016-09-21 DIAGNOSIS — G8929 Other chronic pain: Secondary | ICD-10-CM | POA: Diagnosis not present

## 2016-09-21 DIAGNOSIS — R8299 Other abnormal findings in urine: Secondary | ICD-10-CM | POA: Diagnosis not present

## 2016-09-21 DIAGNOSIS — R82998 Other abnormal findings in urine: Secondary | ICD-10-CM

## 2016-09-21 LAB — POCT URINALYSIS DIPSTICK
Bilirubin, UA: NEGATIVE
Blood, UA: NEGATIVE
Glucose, UA: NEGATIVE
Ketones, UA: NEGATIVE
Leukocytes, UA: NEGATIVE
Nitrite, UA: NEGATIVE
Protein, UA: NEGATIVE
Spec Grav, UA: 1.025 (ref 1.010–1.025)
Urobilinogen, UA: 0.2 E.U./dL
pH, UA: 6 (ref 5.0–8.0)

## 2016-09-21 NOTE — Patient Instructions (Signed)

## 2016-09-21 NOTE — Assessment & Plan Note (Signed)
No red flag symptoms Likely musculoskeletal related to morbid obesity Fibromyalgia may also be contributing as patient is tender to palpation diffusely even to light touch Neurologically intact No evidence of pyelonephritis or nephrolithiasis with negative UA Discussed staying active Given back exercises for when the acute phase resolves Can continue her Flexeril Okay to use ibuprofen intermittently, but discussed the risks of doing so  scheduled Tylenol for the next week Discussed natural course of this and return precautions

## 2016-09-21 NOTE — Progress Notes (Signed)
   Subjective:   Kelsey Bullock is a 56 y.o. female with a history of Cerebri, HTN, T2 DM, morbid obesity, chronic low back pain, fibromyalgia, osteoarthritis here for same day appt for  Chief Complaint  Patient presents with  . Urinary Tract Infection    BACK PAIN  Back pain began 1.5-2 weeks ago. Pain is in lower back L>R - describes catching on L side Pain is described as sharp and constant. Patient has tried tylenol, ibuprofen. Unsure if pain radiates - has chronic pain in both legs. History of trauma or injury: no Prior history of similar pain: yes - she has chronic bilateral low back pain and occasionally has acute worsening's like this History of cancer: no Weak immune system:  no History of IV drug use: no History of steroid use: intermittently for asthma - last 2 months ago  Worse with driving  Better with heat Tylenol dose last night may have helped a a little Slept in chair last night  Symptoms Incontinence of bowel or bladder:  At baseline No dysuria, urinary frequency, urinary urgency, hematuria, fevers Numbness of leg: at baseline - goes to sleep when sitting intermittently Rest or Night pain: no Weight Loss:  no Rash: no  Patient believes UTI or "throwing it out" might be causing their pain.  ROS see HPI Smoking Status noted.   Objective:  BP 114/70 (BP Location: Left Wrist, Patient Position: Sitting, Cuff Size: Normal)   Pulse 86   Temp 97.9 F (36.6 C) (Oral)   Ht 5' 4.5" (1.638 m)   Wt (!) 427 lb 12.8 oz (194 kg)   SpO2 94%   BMI 72.30 kg/m   Gen:  56 y.o. female in NAD But intermittently tearful  HEENT: NCAT, MMM, anicteric sclerae CV: RRR, no MRG Resp: Non-labored, CTAB, no wheezes noted Abd: Soft, NTND, BS present, no guarding or organomegaly Ext: WWP, 1+ edema bilaterally MSK: Gait slow but able to ambulate independently. Tenderness to palpation diffusely over her entire back, even so light touch. No overt CVA tenderness. Pain is  worse to both sides than it is midline. Strength intact in knee flexion and extension as well as plantar and dorsi flexion bilaterally. Patient is unable to perform hip flexion against resistance due to her abdominal girth. Unable to perform straight leg raise as patient cannot lay down on the exam table and her abdominal girth is in the way while sitting. Neuro: Alert and oriented, speech normal, sensation intact grossly to light touch over lower extremities. Achilles DTR symmetric bilaterally      Assessment & Plan:     DEZIRAE SERVICE is a 56 y.o. female here for   LOW BACK PAIN SYNDROME No red flag symptoms Likely musculoskeletal related to morbid obesity Fibromyalgia may also be contributing as patient is tender to palpation diffusely even to light touch Neurologically intact No evidence of pyelonephritis or nephrolithiasis with negative UA Discussed staying active Given back exercises for when the acute phase resolves Can continue her Flexeril Okay to use ibuprofen intermittently, but discussed the risks of doing so  scheduled Tylenol for the next week Discussed natural course of this and return precautions   Virginia Crews, MD MPH PGY-3,  [facility] 09/21/2016  9:36 AM

## 2016-10-13 ENCOUNTER — Ambulatory Visit: Payer: Self-pay

## 2016-10-13 DIAGNOSIS — J4540 Moderate persistent asthma, uncomplicated: Secondary | ICD-10-CM | POA: Diagnosis not present

## 2016-10-16 ENCOUNTER — Telehealth: Payer: Self-pay | Admitting: Family Medicine

## 2016-10-16 ENCOUNTER — Ambulatory Visit (HOSPITAL_COMMUNITY)
Admission: RE | Admit: 2016-10-16 | Discharge: 2016-10-16 | Disposition: A | Discharge status: Home / Self Care | Payer: Medicaid Other | Source: Ambulatory Visit | Attending: Family Medicine | Admitting: Family Medicine

## 2016-10-16 ENCOUNTER — Encounter: Payer: Self-pay | Admitting: Family Medicine

## 2016-10-16 ENCOUNTER — Ambulatory Visit (INDEPENDENT_AMBULATORY_CARE_PROVIDER_SITE_OTHER): Payer: Medicaid Other | Admitting: Family Medicine

## 2016-10-16 VITALS — BP 130/70 | HR 100 | Temp 98.3°F | Wt >= 6400 oz

## 2016-10-16 DIAGNOSIS — R600 Localized edema: Secondary | ICD-10-CM

## 2016-10-16 NOTE — Patient Instructions (Signed)
Continue keeping your legs elevated.   We will check blood work today.  We will set you up to have an ultrasound of your heart.  Please schedule an appointment with Dr Valentina Lucks "Ankle-Brachial Indexes"  Come back to see me or Dr Andria Frames when you have finished the above tests.  Take care,  Dr Jerline Pain

## 2016-10-16 NOTE — Progress Notes (Signed)
    Subjective:  Kelsey Bullock is a 56 y.o. female who presents to the [facility] today with a chief complaint of LE edema.   HPI:  LE Edema Chronic problem for patient for the past several months to years. Worse over the past few days, though has improved some today. Tried elevation which did not seem to help very much. She has intermittent shortness of breath and chest pain, both of which are at her baseline. She denies any orthopnea, though usually sleeps on 8-9 pillows at night for "reflux."  Has a diagnosis of OSA but is not using CPAP.   ROS: Per HPI  PMH: Smoking history reviewed.    Objective:  Physical Exam: BP 130/70   Pulse 100   Temp 98.3 F (36.8 C) (Oral)   Wt (!) 425 lb (192.8 kg)   SpO2 93%   BMI 71.82 kg/m   Gen: NAD, resting comfortably, speaking in full sentences CV: Distant hear sounds, RRR with no murmurs appreciated Pulm: Distant breath sounds, NWOB, CTAB with no crackles, wheezes, or rhonchi GI: Morbidly obese, Normal bowel sounds present. Soft, Nontender, Nondistended. MSK: 2+ pitting edema to midshins bilaterally.  Skin: warm, dry Neuro: grossly normal, moves all extremities Psych: Normal affect and thought content  EKG: NSR, no LVH.   Assessment/Plan:  Lower extremity edema LE edema likely multifactorial. She likely has a component of venous insufficiency secondary to morbid obesity and she is at very high risk to have pulmonary hypertension given her morbid obesity and OSA not on CPAP. Her weight is stable - doubt acute CHF. Given chronic and bilateral nature, doubt DVT. Will check CBC, TSH, and CMET to rule out other causes of LE edema. EKG today without signs of LVH. Her BNP has been normal in the past, however this can be falsely low in the setting of morbid obesity. Will check echo to evaluate for pulmonary hypertension and rule out CHF. Will check ABI to rule out PAD as patient will probably need compression stockings or unna boots in the near  future. Strict return precautions reviewed. Asked patient to follow up when above work up is completed/    [employer]. Jerline Pain, [place] Resident PGY-3 10/16/2016 9:52 AM

## 2016-10-16 NOTE — Assessment & Plan Note (Signed)
LE edema likely multifactorial. She likely has a component of venous insufficiency secondary to morbid obesity and she is at very high risk to have pulmonary hypertension given her morbid obesity and OSA not on CPAP. Her weight is stable - doubt acute CHF. Given chronic and bilateral nature, doubt DVT. Will check CBC, TSH, and CMET to rule out other causes of LE edema. EKG today without signs of LVH. Her BNP has been normal in the past, however this can be falsely low in the setting of morbid obesity. Will check echo to evaluate for pulmonary hypertension and rule out CHF. Will check ABI to rule out PAD as patient will probably need compression stockings or unna boots in the near future. Strict return precautions reviewed. Asked patient to follow up when above work up is completed/

## 2016-10-17 LAB — CMP14+EGFR
ALT: 14 IU/L (ref 0–32)
AST: 12 IU/L (ref 0–40)
Albumin/Globulin Ratio: 1.7 (ref 1.2–2.2)
Albumin: 4.5 g/dL (ref 3.5–5.5)
Alkaline Phosphatase: 97 IU/L (ref 39–117)
BUN/Creatinine Ratio: 15 (ref 9–23)
BUN: 11 mg/dL (ref 6–24)
Bilirubin Total: 0.4 mg/dL (ref 0.0–1.2)
CO2: 24 mmol/L (ref 18–29)
Calcium: 9.5 mg/dL (ref 8.7–10.2)
Chloride: 100 mmol/L (ref 96–106)
Creatinine, Ser: 0.71 mg/dL (ref 0.57–1.00)
GFR calc Af Amer: 112 mL/min/{1.73_m2} (ref >59)
GFR calc non Af Amer: 97 mL/min/{1.73_m2} (ref >59)
Globulin, Total: 2.7 g/dL (ref 1.5–4.5)
Glucose: 152 mg/dL — ABNORMAL HIGH (ref 65–99)
Potassium: 4.7 mmol/L (ref 3.5–5.2)
Sodium: 143 mmol/L (ref 134–144)
Total Protein: 7.2 g/dL (ref 6.0–8.5)

## 2016-10-17 LAB — CBC
Hematocrit: 44.4 % (ref 34.0–46.6)
Hemoglobin: 14.5 g/dL (ref 11.1–15.9)
MCH: 29.8 pg (ref 26.6–33.0)
MCHC: 32.7 g/dL (ref 31.5–35.7)
MCV: 91 fL (ref 79–97)
Platelets: 254 10*3/uL (ref 150–379)
RBC: 4.87 x10E6/uL (ref 3.77–5.28)
RDW: 14.5 % (ref 12.3–15.4)
WBC: 8.3 10*3/uL (ref 3.4–10.8)

## 2016-10-17 LAB — TSH: TSH: 0.854 u[IU]/mL (ref 0.450–4.500)

## 2016-10-19 ENCOUNTER — Encounter: Payer: Self-pay | Admitting: Pharmacist

## 2016-10-19 ENCOUNTER — Ambulatory Visit (INDEPENDENT_AMBULATORY_CARE_PROVIDER_SITE_OTHER): Payer: Medicaid Other | Admitting: Pharmacist

## 2016-10-19 DIAGNOSIS — R600 Localized edema: Secondary | ICD-10-CM

## 2016-10-19 NOTE — Assessment & Plan Note (Signed)
Normal ABI and low likelihood of PAD based on ABI of 1.19 in a patient with symptoms of lower extremity edema and pain in legs. Systolic pressure in arms difficult to assess 2/2 obesity - utilized forearm to measure pressures which may have slightly affected results.   -No medication changes at this time. -Recommended follow-up with PCP in 4 weeks to consider use of compression stockings and need for further evaluation for possible PAD.   -Encouraged pt to discuss continued use of aspirin with PCP at follow-up visit. ASCVD risk calculated < 7.5 %

## 2016-10-19 NOTE — Progress Notes (Signed)
    S:    Patient arrives ambulating and stating she is SOB.  She presents to the clinic for PADABI evaluation.  Patient was referred on 09/15/16 by Dr. Emmaline Life.  Patient was last seen by Primary Care Provider on 10/16/16. Pt reports pain with walking and it is worse while walking than at rest. Pain is described as throbbing pain in toes and shooting pain coming up from legs. Pt endorses not taking aspirin for months as she has difficulty remembering to take it despite use of a pill box.   O:  Lower extremity Physical Exam includes diminished pulses, feet warm to touch, toenails normal, feet and heels dry.  ABI overall = 1.19. Right Arm 108 mmHg    Left Arm 94 mmHg Right ankle posterior tibial 142 mmHg     dorsalis pedis 130 mmHg Left ankle posterior tibial 112 mmHg    dorsalis pedis 108 mmHg Suboptimal test due to extremity circumference, use of large - thigh cuff and use of radial in place of brachial pulse.   A/P: Normal ABI and low likelihood of PAD based on ABI of 1.19 in a patient with symptoms of lower extremity edema and pain in legs. Systolic pressure in arms difficult to assess 2/2 obesity - utilized forearm to measure pressures which may have slightly affected results.   -No medication changes at this time. -Recommended follow-up with PCP in 4 weeks to consider use of compression stockings and need for further evaluation for possible PAD.   -Encouraged pt to discuss continued use of aspirin with PCP at follow-up visit. ASCVD risk calculated < 7.5 %.    Results reviewed and written information provided.   F/U Clinic Visit with Dr. Andria Frames.  Total time in face-to-face counseling 35 minutes.  Patient seen with Arrie Senate, PharmD PGY-1 Resident and Bennye Alm, PharmD, BCPS, PGY2 Resident.

## 2016-10-19 NOTE — Patient Instructions (Signed)
Thank you for coming in today  Your ABI today revealed normal circulation   Followup with Dr Andria Frames within 4-6 weeks

## 2016-10-19 NOTE — Progress Notes (Signed)
Patient ID: Kelsey Bullock, female   DOB: August 02, 1960, 56 y.o.   MRN: [id number] Reviewed: Agree with Dr. Graylin Shiver documentation and management.

## 2016-10-20 ENCOUNTER — Encounter: Payer: Self-pay | Admitting: Family Medicine

## 2016-11-10 ENCOUNTER — Ambulatory Visit (INDEPENDENT_AMBULATORY_CARE_PROVIDER_SITE_OTHER): Payer: Self-pay | Admitting: *Deleted

## 2016-11-10 DIAGNOSIS — J4540 Moderate persistent asthma, uncomplicated: Secondary | ICD-10-CM

## 2016-11-10 MED ORDER — OMALIZUMAB 150 MG ~~LOC~~ SOLR
300.0000 mg | SUBCUTANEOUS | Status: DC
  Administered 2016-11-10 – 2020-09-28 (×50): 300 mg via SUBCUTANEOUS

## 2016-12-08 ENCOUNTER — Ambulatory Visit (INDEPENDENT_AMBULATORY_CARE_PROVIDER_SITE_OTHER): Payer: Medicaid Other

## 2016-12-08 DIAGNOSIS — J4540 Moderate persistent asthma, uncomplicated: Secondary | ICD-10-CM

## 2016-12-11 ENCOUNTER — Ambulatory Visit (HOSPITAL_COMMUNITY)
Admission: RE | Admit: 2016-12-11 | Discharge: 2016-12-11 | Disposition: A | Discharge status: Home / Self Care | Payer: Medicaid Other | Source: Ambulatory Visit | Attending: Family Medicine | Admitting: Family Medicine

## 2016-12-11 DIAGNOSIS — I517 Cardiomegaly: Secondary | ICD-10-CM | POA: Diagnosis not present

## 2016-12-11 DIAGNOSIS — R600 Localized edema: Secondary | ICD-10-CM | POA: Diagnosis present

## 2016-12-11 MED ORDER — PERFLUTREN LIPID MICROSPHERE
1.0000 mL | INTRAVENOUS | Status: AC | PRN
  Administered 2016-12-11: 2 mL via INTRAVENOUS
  Filled 2016-12-11: qty 10

## 2016-12-11 NOTE — Progress Notes (Signed)
  Echocardiogram 2D Echocardiogram has been performed.  Donata Clay 12/11/2016, 12:14 PM

## 2016-12-14 ENCOUNTER — Encounter: Payer: Self-pay | Admitting: Family Medicine

## 2016-12-14 ENCOUNTER — Ambulatory Visit (INDEPENDENT_AMBULATORY_CARE_PROVIDER_SITE_OTHER): Payer: Medicaid Other | Admitting: Family Medicine

## 2016-12-14 ENCOUNTER — Other Ambulatory Visit: Payer: Self-pay | Admitting: Allergy and Immunology

## 2016-12-14 VITALS — BP 128/76 | HR 86 | Temp 98.6°F | Ht 65.0 in | Wt >= 6400 oz

## 2016-12-14 DIAGNOSIS — E1169 Type 2 diabetes mellitus with other specified complication: Secondary | ICD-10-CM

## 2016-12-14 DIAGNOSIS — L219 Seborrheic dermatitis, unspecified: Secondary | ICD-10-CM

## 2016-12-14 DIAGNOSIS — E669 Obesity, unspecified: Secondary | ICD-10-CM | POA: Diagnosis not present

## 2016-12-14 DIAGNOSIS — F331 Major depressive disorder, recurrent, moderate: Secondary | ICD-10-CM

## 2016-12-14 DIAGNOSIS — I5030 Unspecified diastolic (congestive) heart failure: Secondary | ICD-10-CM | POA: Diagnosis not present

## 2016-12-14 LAB — POCT GLYCOSYLATED HEMOGLOBIN (HGB A1C): Hemoglobin A1C: 6.9

## 2016-12-14 MED ORDER — TRIAMCINOLONE ACETONIDE 0.025 % EX OINT: 1.0000 "application " | TOPICAL_OINTMENT | Freq: Two times a day (BID) | CUTANEOUS | 2 refills | Status: DC

## 2016-12-14 MED ORDER — FUROSEMIDE 40 MG PO TABS: 40.0000 mg | ORAL_TABLET | Freq: Every day | ORAL | 3 refills | Status: DC

## 2016-12-14 NOTE — Assessment & Plan Note (Signed)
Clearly her edema and shortness of breath are multi factorial.  However, I do believe she warrents the diagnosis of diastolic CHF based on symptoms, edema and positive echo findings.  As such I will switch her thiazide to a loop diuretic.  Recheck in one month.

## 2016-12-14 NOTE — Patient Instructions (Signed)
Diabetes control is good You have diastolic congestive heart failure - your heart muscle has trouble relaxing, which causes you to retain fluid Stop the hydrochlorothiazide. Start furosemide, a stronger fluid pill I sent in a refill of the face cream See me in one month. You need to do something about the weight, which is causing most of your medical problems.

## 2016-12-14 NOTE — Assessment & Plan Note (Signed)
Once again discussed that her morbid obesity was catching up with her and giving her more chronic diseases.  I tried asking, What can you do differently?  With her external locus of control, she really couldn't come up with much.  She finally volunteered a garlic diet that a friend has used successfully.

## 2016-12-14 NOTE — Assessment & Plan Note (Signed)
Poor control and stable.  She is no longer in counseling.  Asked to explore options before next visit.

## 2016-12-14 NOTE — Progress Notes (Signed)
   Subjective:    Patient ID: EVELLA KASAL, female    DOB: June 07, 1960, 56 y.o.   MRN: [id number]  HPI Here for review of echo, which showed grade 1 diastolic dysfunction.  Symptoms are unchanged from 5/14 visit with Dr. Jerline Pain.  He did not change meds.  She is on HCTZ.  Still with edema.  Mild SOB.  Known asthma.  She continues to have an external locus of control.  "I can't lose weight."  Stress and depression are her constant companions.  Not more than usual and no SI or HI.    Review of Systems     Objective:   Physical Exam  Wt up 4 lbs and now has BMI=71 Lungs clear Cardiac RRR without m or g Abd central obesity. Ext 2+ edema.        Assessment & Plan:

## 2016-12-16 ENCOUNTER — Other Ambulatory Visit: Payer: Self-pay | Admitting: Family Medicine

## 2016-12-16 DIAGNOSIS — L304 Erythema intertrigo: Secondary | ICD-10-CM

## 2017-01-05 ENCOUNTER — Ambulatory Visit (INDEPENDENT_AMBULATORY_CARE_PROVIDER_SITE_OTHER): Payer: Medicaid Other

## 2017-01-05 DIAGNOSIS — J4540 Moderate persistent asthma, uncomplicated: Secondary | ICD-10-CM | POA: Diagnosis not present

## 2017-01-18 ENCOUNTER — Encounter: Payer: Self-pay | Admitting: Family Medicine

## 2017-01-18 ENCOUNTER — Ambulatory Visit (INDEPENDENT_AMBULATORY_CARE_PROVIDER_SITE_OTHER): Payer: Medicaid Other | Admitting: Family Medicine

## 2017-01-18 DIAGNOSIS — I5030 Unspecified diastolic (congestive) heart failure: Secondary | ICD-10-CM | POA: Diagnosis present

## 2017-01-18 MED ORDER — FUROSEMIDE 40 MG PO TABS: 40.0000 mg | ORAL_TABLET | Freq: Two times a day (BID) | ORAL | 3 refills | Status: DC

## 2017-01-18 NOTE — Patient Instructions (Addendum)
I am glad your weight is down 7 lbs.  Some of that is fluid. I want you to increase your lasix to one pill twice a day.  Take the second pill sometime between 1-6PM I will call with the blood work results. If things are going well, see me in two months.

## 2017-01-19 LAB — BASIC METABOLIC PANEL
BUN/Creatinine Ratio: 16 (ref 9–23)
BUN: 11 mg/dL (ref 6–24)
CO2: 26 mmol/L (ref 20–29)
Calcium: 9.3 mg/dL (ref 8.7–10.2)
Chloride: 99 mmol/L (ref 96–106)
Creatinine, Ser: 0.68 mg/dL (ref 0.57–1.00)
GFR calc Af Amer: 115 mL/min/{1.73_m2} (ref >59)
GFR calc non Af Amer: 99 mL/min/{1.73_m2} (ref >59)
Glucose: 119 mg/dL — ABNORMAL HIGH (ref 65–99)
Potassium: 4 mmol/L (ref 3.5–5.2)
Sodium: 143 mmol/L (ref 134–144)

## 2017-01-19 NOTE — Assessment & Plan Note (Signed)
Check BMP and increase lasix to bid.

## 2017-01-19 NOTE — Progress Notes (Signed)
   Subjective:    Patient ID: Kelsey Bullock, female    DOB: Jan 04, 1961, 56 y.o.   MRN: [id number]  HPI Follow up diastolic CHF.  Switched from HCTZ to furosemide last visit.  The furosemide gives a brisk diuresis for a few hours after taking.  In the interim, she also had several days of a "stomach virus" with diarrhea and decreased PO.  Now back to normal.  She does not feel that her DOE has improved.  Of course, the DOE is multifactorial.    HPDP, she has had a pap smear in the last 1.5 years.  Will get records.    Review of Systems     Objective:   Physical Exam Wt down 7 lbs. Lungs clear Cardiac RRR without m or g Ext 3+ edema       Assessment & Plan:

## 2017-02-02 ENCOUNTER — Ambulatory Visit (INDEPENDENT_AMBULATORY_CARE_PROVIDER_SITE_OTHER): Payer: Medicaid Other

## 2017-02-02 DIAGNOSIS — J4540 Moderate persistent asthma, uncomplicated: Secondary | ICD-10-CM

## 2017-02-08 ENCOUNTER — Other Ambulatory Visit: Payer: Self-pay | Admitting: Allergy and Immunology

## 2017-02-12 ENCOUNTER — Other Ambulatory Visit: Payer: Self-pay | Admitting: Allergy and Immunology

## 2017-02-12 MED ORDER — MONTELUKAST SODIUM 10 MG PO TABS: 10.0000 mg | ORAL_TABLET | Freq: Every day | ORAL | 0 refills | Status: DC

## 2017-02-12 NOTE — Telephone Encounter (Signed)
Sent script into pharmacy. 

## 2017-02-12 NOTE — Telephone Encounter (Signed)
Patient is sick Patient needs a refill on [place] Patient could not get refill until seen - patient has made and appt for 02/27/2017 - can a courtesy refill be sent in prior to visit

## 2017-02-13 ENCOUNTER — Ambulatory Visit (INDEPENDENT_AMBULATORY_CARE_PROVIDER_SITE_OTHER): Payer: Medicaid Other | Admitting: Allergy and Immunology

## 2017-02-13 ENCOUNTER — Telehealth: Payer: Self-pay

## 2017-02-13 ENCOUNTER — Encounter: Payer: Self-pay | Admitting: Allergy and Immunology

## 2017-02-13 VITALS — BP 132/68 | HR 96 | Temp 98.7°F | Resp 22

## 2017-02-13 DIAGNOSIS — J3089 Other allergic rhinitis: Secondary | ICD-10-CM | POA: Diagnosis not present

## 2017-02-13 DIAGNOSIS — J4550 Severe persistent asthma, uncomplicated: Secondary | ICD-10-CM | POA: Diagnosis not present

## 2017-02-13 DIAGNOSIS — J0140 Acute pansinusitis, unspecified: Secondary | ICD-10-CM | POA: Diagnosis not present

## 2017-02-13 DIAGNOSIS — K219 Gastro-esophageal reflux disease without esophagitis: Secondary | ICD-10-CM

## 2017-02-13 NOTE — Progress Notes (Signed)
Follow-up Note  Referring Provider: [name], MD Primary Provider: [name], MD Date of Office Visit: 02/13/2017  Subjective:   Kelsey Bullock (DOB: [birth year]) is a 56 y.o. female who returns to the [facility] on 02/13/2017 in re-evaluation of the following:  HPI: Kelsey Bullock presents to this clinic in evaluation of her asthma and allergic rhinitis and reflux-induced respiratory disease. I have not seen her in this clinic in over a year.  She has really done very well with her respiratory tract issue while consistently using Xolair and anti-inflammatory medications for both her upper and lower airway. She has not required a systemic steroid or an antibiotic to treat any type of respiratory tract issue. She uses a short acting bronchodilator about 3-5 times per week. She does not exercise to any degree.  Unfortunately, a upper respiratory tract infection came into the family over the course of the past 2 weeks and there are 2 individuals that are now sick and Kelsey Bullock developed problems with nasal congestion and bad sore throat and ear pressure and postnasal drip and feeling bad in general over the course of the past 48-72 hours. She has not had any ugly nasal discharge or bad headaches or severe coughing or sputum production. She is not sure she has had a fever.  Her reflux also appears to be doing quite well on her current plan.  Allergies as of 02/13/2017      Reactions   Etodolac Shortness Of Breath, Anaphylaxis   REACTION: lips swelled   Penicillins Swelling, Anaphylaxis   Amoxicillin    REACTION: unspecified   Augmentin [amoxicillin-pot Clavulanate]    Diclofenac Sodium    REACTION: unspecified   Iodine    REACTION: unspecified   Latex    Omeprazole    REACTION: unspecified   Other    Other reaction(s): Other (See Comments) Plastic Tape=redness   Pantoprazole Sodium    REACTION: unspecified      Medication List      acetaminophen 500  MG tablet Commonly known as:  TYLENOL Take 500 mg by mouth every 6 (six) hours as needed.   aspirin 81 MG EC tablet Take 1 tablet (81 mg total) by mouth daily. Swallow whole.   buPROPion 100 MG tablet Commonly known as:  WELLBUTRIN Take 100 mg by mouth 2 (two) times daily.   calcium carbonate 1250 MG capsule Take 1,250 mg by mouth 2 (two) times daily with a meal. Reported on 11/02/2015   cetirizine 10 MG tablet Commonly known as:  ZYRTEC TAKE 1 TABLET BY MOUTH EVERY DAY FOR RUNNY NOSE OR ITCHING   clonazePAM 1 MG tablet Commonly known as:  KLONOPIN Take 1 mg by mouth 2 (two) times daily.   cyclobenzaprine 10 MG tablet Commonly known as:  FLEXERIL TAKE 1 TABLET BY MOUTH AT BEDTIME AS NEEDED FOR BACK OR NECK PAIN OR SPASMS   DULERA 200-5 MCG/ACT Aero Generic drug:  mometasone-formoterol INHALE 2 PUFFS INTO THE LUNGS TWICE DAILY   Echinacea 125 MG Caps Take 1 capsule by mouth daily as needed. Reported on 11/02/2015   EPINEPHrine 0.3 mg/0.3 mL Soaj injection Commonly known as:  EPIPEN 2-PAK Inject 0.3 mLs (0.3 mg total) into the muscle once.   famotidine 20 MG tablet Commonly known as:  PEPCID TAKE 1 TABLET BY MOUTH EVERY DAY   FLUoxetine 10 MG capsule Commonly known as:  PROZAC Take 10 mg by mouth daily.   fluticasone 50 MCG/ACT nasal spray  Commonly known as:  FLONASE USE 2 SPRAYS IN EACH NOSTRIL ONCE DIALY FOR STUFFY NOSE OR DRAINAGE.   furosemide 40 MG tablet Commonly known as:  LASIX Take 1 tablet (40 mg total) by mouth 2 (two) times daily.   ibuprofen 800 MG tablet Commonly known as:  ADVIL,MOTRIN TAKE 1 TABLET BY MOUTH THREE TIMES DAILY AS NEEDED FOR PAIN   ipratropium 0.06 % nasal spray Commonly known as:  ATROVENT USE 2 SPRAYS IN EACH NOSTRIL EVERY 6 HOURS IF NEEDED TO DRY UP NOSE   lisinopril 40 MG tablet Commonly known as:  PRINIVIL,ZESTRIL TAKE 1 TABLET BY MOUTH EVERY DAY   magnesium (amino acid chelate) 133 MG tablet Take 1 tablet by mouth 2  (two) times daily. Reported on 11/02/2015   mirtazapine 45 MG tablet Commonly known as:  REMERON Take 45 mg by mouth at bedtime.   montelukast 10 MG tablet Commonly known as:  SINGULAIR Take 1 tablet (10 mg total) by mouth at bedtime.   norethindrone 5 MG tablet Commonly known as:  AYGESTIN Take 2.5 mg by mouth daily.   nystatin 100000 UNIT/ML suspension Commonly known as:  MYCOSTATIN Take 5 mLs (500,000 Units total) by mouth 4 (four) times daily.   nystatin powder Generic drug:  nystatin APPLY TOPICALLY THREE TIMES DAILY   olopatadine 0.1 % ophthalmic solution Commonly known as:  PATANOL Place 1 drop into both eyes 2 (two) times daily.   omalizumab 150 MG injection Commonly known as:  XOLAIR Inject 300 mg into the skin every 28 (twenty-eight) days.   omeprazole 40 MG capsule Commonly known as:  PRILOSEC Take 40 mg by mouth daily. Reported on 08/03/2015   PROAIR HFA 108 (90 Base) MCG/ACT inhaler Generic drug:  albuterol INHALE 2 PUFFS INTO THE LUNGS EVERY 4 HOURS AS NEEDED FOR WHEEZING   albuterol (2.5 MG/3ML) 0.083% nebulizer solution Commonly known as:  PROVENTIL USE 1 UNIT IN NEBULIZER EVERY 4 TO 6 HOURS AS NEEDED FOR COUGH OR WHEEZE.   QVAR 80 MCG/ACT inhaler Generic drug:  beclomethasone INHALE 2 PUFFS BY MOUTH INTO THE LUNGS TWICE DAILY   simvastatin 80 MG tablet Commonly known as:  ZOCOR TAKE 1 TABLET BY MOUTH EVERY NIGHT AT BEDTIME   traZODone 100 MG tablet Commonly known as:  DESYREL Take 100 mg by mouth at bedtime.   triamcinolone 0.025 % ointment Commonly known as:  KENALOG Apply 1 application topically 2 (two) times daily.   vitamin C 100 MG tablet Take 100 mg by mouth daily. Reported on 08/03/2015       Past Medical History:  Diagnosis Date  . Allergic rhinitis   . Asthma   . Congestive heart failure (CHF) ([place])   . Depression   . Gall stone   . GERD (gastroesophageal reflux disease)     Past Surgical History:  Procedure Laterality  Date  . ADENOIDECTOMY    . CESAREAN SECTION    . CYST EXCISION    . OVARIAN CYST REMOVAL    . removal of gallstones    . TONSILLECTOMY    . TYMPANOSTOMY TUBE PLACEMENT      Review of systems negative except as noted in HPI / PMHx or noted below:  Review of Systems  Constitutional: Negative.   HENT: Negative.   Eyes: Negative.   Respiratory: Negative.   Cardiovascular: Negative.   Gastrointestinal: Negative.   Genitourinary: Negative.   Musculoskeletal: Negative.   Skin: Negative.   Neurological: Negative.   Endo/Heme/Allergies: Negative.   Psychiatric/Behavioral:  Negative.      Objective:   Vitals:   02/13/17 1724  BP: 132/68  Pulse: 96  Resp: (!) 22  Temp: 98.7 F (37.1 C)  SpO2: 93%          Physical Exam  Constitutional: She is well-developed, well-nourished, and in no distress.  HENT:  Head: Normocephalic.  Right Ear: Tympanic membrane, external ear and ear canal normal.  Left Ear: Tympanic membrane, external ear and ear canal normal.  Nose: Mucosal edema (erythematous) present. No rhinorrhea.  Mouth/Throat: Uvula is midline and mucous membranes are normal. Posterior oropharyngeal erythema (2 mm aphthous ulcer soft palate right) present. No oropharyngeal exudate.  Eyes: Conjunctivae are normal.  Neck: Trachea normal. No tracheal tenderness present. No tracheal deviation present. No thyromegaly present.  Cardiovascular: Normal rate, regular rhythm, S1 normal, S2 normal and normal heart sounds.   No murmur heard. Pulmonary/Chest: Breath sounds normal. No stridor. No respiratory distress. She has no wheezes. She has no rales.  Musculoskeletal: She exhibits no edema.  Lymphadenopathy:       Head (right side): No tonsillar adenopathy present.       Head (left side): No tonsillar adenopathy present.    She has no cervical adenopathy.  Neurological: She is alert. Gait normal.  Skin: No rash noted. She is not diaphoretic. No erythema. Nails show no clubbing.    Psychiatric: Mood and affect normal.    Diagnostics: Oxygen saturation was on room air at rest was 93%   Spirometry was performed and demonstrated an FEV1 of 1.89 at 68 % of predicted.  The patient had an Asthma Control Test with the following results:  .    Assessment and Plan:   1. Asthma, severe persistent, well-controlled   2. Other allergic rhinitis   3. LPRD (laryngopharyngeal reflux disease)   4. Acute pansinusitis, recurrence not specified      1. Prednisone 10 mg tablet 1 time a day for 4 days only   2. Can add OTC ibuprofen 600-800 milligrams times a day while "sick"  3. Can add Nasal saline multiple times per day while "sick"  4. Can add over-the-counter Mucinex DM twice a day while "sick"  5. Continue Dulera 200 2 inhalations twice a day and add Qvar 80 2 inhalations twice a day while "sick"  6. Continue montelukast 10 mg daily  7. Continue Flonase one or 2 sprays each nostril daily  8. Continue Xolair and EpiPen  9. Continue Omeprazole 40 mg in AM plus Famotidine 20 mg in PM  10. Return to clinic in 3 months or earlier if problem  11. Obtain flu vaccine when better  I suspect that Kelsey Bullock will do quite well on her current medical therapy given the fact that this is probably a viral infection that will be somewhat self-limited. I did give her a very low dose of anti-inflammatory medications in the form of a systemic steroid and she has a large collection of symptomatic medications to choose from while she continues to use anti-inflammatory agents for her respiratory tract and omalizumab and also continues to treat her reflux disease. If she does well I will see her back in this clinic in 3 months or earlier if there is a problem. She will need to obtain a flu vaccine this fall when she is better.  Allena Katz, MD [facility] [place]

## 2017-02-13 NOTE — Patient Instructions (Addendum)
  1. Prednisone 10 mg tablet 1 time a day for 4 days only   2. Can add OTC ibuprofen 600-800 milligrams times a day while "sick"  3. Can add Nasal saline multiple times per day while "sick"  4. Can add over-the-counter Mucinex DM twice a day while "sick"  5. Continue Dulera 200 2 inhalations twice a day and add Qvar 80 2 inhalations twice a day while "sick"  6. Continue montelukast 10 mg daily  7. Continue Flonase one or 2 sprays each nostril daily  8. Continue Xolair and EpiPen  9. Continue Omeprazole 40 mg in AM plus Famotidine 20 mg in PM  10. Return to clinic in 3 months or earlier if problem  11. Obtain flu vaccine when better

## 2017-02-13 NOTE — Telephone Encounter (Signed)
Patient called this morning to see if we had any cancellations this morning with Dr. Neldon Mc. We had 1 at 3:00 today. Patient originally had an appointment made on 03/02/2017, but she wanted to get seen sooner. Patient is complaining of breathing issues,cough,sore throat,and being stopped up. Patient is coming in this morning at 3.

## 2017-02-27 ENCOUNTER — Ambulatory Visit: Payer: Medicaid Other | Admitting: Allergy and Immunology

## 2017-03-02 ENCOUNTER — Ambulatory Visit: Payer: Self-pay

## 2017-03-02 DIAGNOSIS — J4540 Moderate persistent asthma, uncomplicated: Secondary | ICD-10-CM

## 2017-03-07 ENCOUNTER — Ambulatory Visit: Payer: Self-pay

## 2017-03-07 ENCOUNTER — Ambulatory Visit (INDEPENDENT_AMBULATORY_CARE_PROVIDER_SITE_OTHER): Payer: Medicaid Other | Admitting: *Deleted

## 2017-03-07 DIAGNOSIS — Z23 Encounter for immunization: Secondary | ICD-10-CM | POA: Diagnosis present

## 2017-03-13 ENCOUNTER — Other Ambulatory Visit: Payer: Self-pay | Admitting: Family Medicine

## 2017-03-14 ENCOUNTER — Other Ambulatory Visit: Payer: Self-pay | Admitting: Family Medicine

## 2017-03-15 ENCOUNTER — Other Ambulatory Visit: Payer: Self-pay | Admitting: Allergy and Immunology

## 2017-03-22 ENCOUNTER — Ambulatory Visit (INDEPENDENT_AMBULATORY_CARE_PROVIDER_SITE_OTHER): Payer: Medicaid Other | Admitting: Internal Medicine

## 2017-03-22 DIAGNOSIS — B370 Candidal stomatitis: Secondary | ICD-10-CM

## 2017-03-22 DIAGNOSIS — B9789 Other viral agents as the cause of diseases classified elsewhere: Secondary | ICD-10-CM

## 2017-03-22 DIAGNOSIS — J069 Acute upper respiratory infection, unspecified: Secondary | ICD-10-CM | POA: Diagnosis not present

## 2017-03-22 MED ORDER — BENZONATATE 100 MG PO CAPS: 200.0000 mg | ORAL_CAPSULE | Freq: Three times a day (TID) | ORAL | 0 refills | Status: DC | PRN

## 2017-03-22 MED ORDER — NYSTATIN 100000 UNIT/ML MT SUSP: 5.0000 mL | Freq: Four times a day (QID) | OROMUCOSAL | 0 refills | Status: DC

## 2017-03-22 NOTE — Assessment & Plan Note (Addendum)
Likely 2/2 inhaled steroid use (Dulera and QVAR). Says she has not been using more than prescribed, but encouraged patient to use only as directed. Probably also contributing to sore throat.   - Nystatin oral suspension QID

## 2017-03-22 NOTE — Patient Instructions (Signed)
It was nice meeting you today Ms. Curd!  For thrush, please begin using the Nystatin mouthwash four times a day. Try to only your use inhalers as prescribed and not more than necessary.   For your cough, you can take one Tessalon perle up to every 8 hours as needed. A teaspoonful of honey, either alone or mixed into a warm beverage will also help.   For sore throat, you can use Cepachol throat lozenges or Chloraseptic throat spray from the drugstore. These have a numbing medicine that will help with the pain. You can also take ibuprofen or Tylenol.   It is very important to stay hydrated, so make sure you are drinking even if you do not have much of an appetite.   If you are not feeling better in about a week, please call our office.   If you have any questions or concerns, please feel free to call the clinic.   Be well,  Dr. Avon Gully

## 2017-03-22 NOTE — Progress Notes (Signed)
   Subjective:   Patient: Kelsey Bullock       Birthdate: Jun 27, 1960       MRN: [id number]      HPI  Kelsey Bullock is a 56 y.o. female presenting for walk in appt for R ear pain, thrush, and sore throat.   R ear pain Began about a week ago. Only on R. Denies discharge, hearing changes. Has not taken any medication or tried any ear drops to help with pain. Has been swallowing Vicks Vapo Rub because a friend told her to do so. Has not been helpful. Has also been taking echinacea. Occasionally uses Q tips to try to clean her ears but has not been using more than usual recently.   Sore throat Began about a week ago. Thinks may be due in part to thrush. Also with cough that is sometimes productive of clear sputum. Pain with swallowing and talking. Has a normal appetite and is drinking well. Has not tried anything to help with symptoms. Son said she felt warm last night but has not measured her temperature.   Ritta Slot Says began a few days ago. Has happened to her before. Has asthma for which she is prescribed inhalers (QVAR and Dulera). Also have a nebulizer. Washes her mouth out after using inhalers but this doesn't always prevent thrush. Has used Nystatin mouthwash in past with good results.   Smoking status reviewed. Patient is never smoker.   Review of Systems See HPI.     Objective:  Physical Exam  Constitutional: She is oriented to person, place, and time and well-developed, well-nourished, and in no distress.  HENT:  Head: Normocephalic and atraumatic.  Right Ear: External ear normal.  Left Ear: External ear normal.  White film consistent in appearance with thrush on tongue. No oropharyngeal erythema noted though hard to tell due to thrush.   Eyes: Pupils are equal, round, and reactive to light. Conjunctivae and EOM are normal. Right eye exhibits no discharge. Left eye exhibits no discharge.  Neck: Normal range of motion. Neck supple.  Cardiovascular: Normal rate, regular rhythm  and normal heart sounds.   No murmur heard. Pulmonary/Chest: Effort normal and breath sounds normal. No respiratory distress. She has no wheezes. She has no rales.  Coughing intermittently throughout encounter  Lymphadenopathy:    She has no cervical adenopathy.  Neurological: She is alert and oriented to person, place, and time.  Skin: Skin is warm and dry.  Psychiatric: Affect and judgment normal.      Assessment & Plan:  Thrush Likely 2/2 inhaled steroid use (Dulera and QVAR). Says she has not been using more than prescribed, but encouraged patient to use only as directed. Probably also contributing to sore throat.   - Nystatin oral suspension QID  Viral URI with cough Symptoms most consistent with viral etiology. Lungs CTAB, speaking in full sentences, and normal WOB on RA. Afebrile and well-appearing on exam. Thrush present on oral exam, however MMM. No abnormalities of TM, so ear pain possibly due to sinus pressure. Will treat conservatively.  - Tessalon perles TID PRN - Honey PRN cough - Cepachol lozenges, Chloraseptic throat spray - Ibuprofen/Tylenol PRN HA/bodypains - F/u if no improvement in a week   Adin Hector, MD, MPH PGY-3 [facility] Pager [phone]

## 2017-03-22 NOTE — Assessment & Plan Note (Signed)
Symptoms most consistent with viral etiology. Lungs CTAB, speaking in full sentences, and normal WOB on RA. Afebrile and well-appearing on exam. Thrush present on oral exam, however MMM. No abnormalities of TM, so ear pain possibly due to sinus pressure. Will treat conservatively.  - Tessalon perles TID PRN - Honey PRN cough - Cepachol lozenges, Chloraseptic throat spray - Ibuprofen/Tylenol PRN HA/bodypains - F/u if no improvement in a week

## 2017-03-28 ENCOUNTER — Ambulatory Visit (INDEPENDENT_AMBULATORY_CARE_PROVIDER_SITE_OTHER): Payer: Medicaid Other | Admitting: Family Medicine

## 2017-03-28 ENCOUNTER — Encounter: Payer: Self-pay | Admitting: Family Medicine

## 2017-03-28 VITALS — BP 126/82 | HR 84 | Temp 98.2°F | Ht 66.0 in | Wt >= 6400 oz

## 2017-03-28 DIAGNOSIS — Z1211 Encounter for screening for malignant neoplasm of colon: Secondary | ICD-10-CM

## 2017-03-28 DIAGNOSIS — E669 Obesity, unspecified: Secondary | ICD-10-CM

## 2017-03-28 DIAGNOSIS — E1169 Type 2 diabetes mellitus with other specified complication: Secondary | ICD-10-CM | POA: Diagnosis present

## 2017-03-28 DIAGNOSIS — J069 Acute upper respiratory infection, unspecified: Secondary | ICD-10-CM | POA: Diagnosis not present

## 2017-03-28 DIAGNOSIS — I5030 Unspecified diastolic (congestive) heart failure: Secondary | ICD-10-CM | POA: Diagnosis not present

## 2017-03-28 LAB — POCT GLYCOSYLATED HEMOGLOBIN (HGB A1C): Hemoglobin A1C: 6.5

## 2017-03-28 MED ORDER — FUROSEMIDE 40 MG PO TABS: 40.0000 mg | ORAL_TABLET | Freq: Every day | ORAL | 3 refills | Status: DC

## 2017-03-28 NOTE — Patient Instructions (Addendum)
Your diabetes is good. My nurse will get you to sign a release for your pap smear. Please see your eye doctor and make sure he sends me a report.   Next time you have a cut or a scrape, please come in and let us give you a tetanus shot.   I think the first docs were right: this is just a virus and should keep clearing up on its own.   Only take the lasix once a day.

## 2017-03-29 DIAGNOSIS — J069 Acute upper respiratory infection, unspecified: Secondary | ICD-10-CM | POA: Insufficient documentation

## 2017-03-29 NOTE — Assessment & Plan Note (Signed)
Good control

## 2017-03-29 NOTE — Assessment & Plan Note (Signed)
Slow to resolve.

## 2017-03-29 NOTE — Progress Notes (Signed)
   Subjective:    Patient ID: Kelsey Bullock, female    DOB: [birth year], 56 y.o.   MRN: [id number]  HPI Seen last week with sore throat and right ear pain thought to be a viral URI.  Still symptomatic and wants recheck.  No cough or feverl.  Also, We have her listed as due for a PAP, but has had elsewhere in the past year.  Will request records Similarly with eye exam.  Currently being seen every six months.  Will get records.    Poor eating.  Now only taking lasix once a day most days.  Wt and ankle swelling are both down.    Review of Systems     Objective:   Physical Exam TMs nl Throat mild injection without exudae Neck no sig nodes. Lungs clear. Cardiac RRR without m or g Legs 1+ edema bilaterally.        Assessment & Plan:

## 2017-03-29 NOTE — Assessment & Plan Note (Signed)
Refer back for colonoscopy.  Hx of polyps.  Not in our records.  Done in [place].

## 2017-03-29 NOTE — Assessment & Plan Note (Signed)
Decrease lasix to once daily.

## 2017-03-30 ENCOUNTER — Ambulatory Visit: Payer: Self-pay

## 2017-03-30 DIAGNOSIS — J4540 Moderate persistent asthma, uncomplicated: Secondary | ICD-10-CM | POA: Diagnosis not present

## 2017-04-04 ENCOUNTER — Other Ambulatory Visit: Payer: Self-pay | Admitting: Family Medicine

## 2017-04-04 MED ORDER — ATORVASTATIN CALCIUM 40 MG PO TABS: 40.0000 mg | ORAL_TABLET | Freq: Every day | ORAL | 3 refills | Status: DC

## 2017-04-04 NOTE — Telephone Encounter (Signed)
No longer using simvastatin 80.  Called patient and will switch to atorvastatin.  Patient understood change.

## 2017-04-23 LAB — HM DIABETES EYE EXAM

## 2017-04-30 ENCOUNTER — Ambulatory Visit: Payer: Self-pay

## 2017-04-30 ENCOUNTER — Encounter: Payer: Self-pay | Admitting: Family Medicine

## 2017-04-30 DIAGNOSIS — J4540 Moderate persistent asthma, uncomplicated: Secondary | ICD-10-CM | POA: Diagnosis not present

## 2017-05-21 ENCOUNTER — Telehealth: Payer: Self-pay | Admitting: Family Medicine

## 2017-05-21 NOTE — Telephone Encounter (Signed)
Pt called and said when she went to get her Lipitor medication from the pharmacy they ended up giving her calcium instead of the Lipitor. So pt has been calcium this whole time. Please advise

## 2017-05-22 NOTE — Telephone Encounter (Signed)
Called.  She got bottle and verified that she is taking atorvastatin calcium 40 mg.  I educated her that this was indeed her lipid lowering medicine.

## 2017-05-30 ENCOUNTER — Ambulatory Visit: Payer: Self-pay

## 2017-05-31 ENCOUNTER — Ambulatory Visit: Payer: Self-pay | Admitting: *Deleted

## 2017-05-31 DIAGNOSIS — J4540 Moderate persistent asthma, uncomplicated: Secondary | ICD-10-CM | POA: Diagnosis not present

## 2017-06-22 ENCOUNTER — Other Ambulatory Visit: Payer: Self-pay | Admitting: Allergy and Immunology

## 2017-06-29 ENCOUNTER — Ambulatory Visit: Payer: Medicaid Other

## 2017-06-29 DIAGNOSIS — J4540 Moderate persistent asthma, uncomplicated: Secondary | ICD-10-CM

## 2017-07-06 ENCOUNTER — Encounter: Payer: Self-pay | Admitting: Family Medicine

## 2017-07-06 ENCOUNTER — Telehealth: Payer: Self-pay

## 2017-07-06 NOTE — Telephone Encounter (Signed)
Patient in office with son and requests a refill of Terconazole. Unable to find on med list. Danley Danker, RN [facility] RN)

## 2017-07-09 MED ORDER — TERCONAZOLE 0.4 % VA CREA: 1.0000 | TOPICAL_CREAM | Freq: Every day | VAGINAL | 0 refills | Status: DC

## 2017-07-09 NOTE — Telephone Encounter (Signed)
Done and patient notified.  She has frequent vag yeast infections.

## 2017-07-12 ENCOUNTER — Other Ambulatory Visit: Payer: Self-pay | Admitting: Family Medicine

## 2017-07-12 DIAGNOSIS — Z139 Encounter for screening, unspecified: Secondary | ICD-10-CM

## 2017-07-16 ENCOUNTER — Other Ambulatory Visit: Payer: Self-pay | Admitting: Family Medicine

## 2017-07-16 DIAGNOSIS — I10 Essential (primary) hypertension: Secondary | ICD-10-CM

## 2017-07-18 ENCOUNTER — Other Ambulatory Visit: Payer: Self-pay | Admitting: *Deleted

## 2017-07-18 MED ORDER — OMALIZUMAB 150 MG ~~LOC~~ SOLR: 300.0000 mg | SUBCUTANEOUS | 11 refills | Status: DC

## 2017-07-24 ENCOUNTER — Other Ambulatory Visit: Payer: Self-pay | Admitting: Family Medicine

## 2017-07-24 DIAGNOSIS — L304 Erythema intertrigo: Secondary | ICD-10-CM

## 2017-07-29 ENCOUNTER — Other Ambulatory Visit: Payer: Self-pay | Admitting: Allergy and Immunology

## 2017-07-31 ENCOUNTER — Encounter: Payer: Self-pay | Admitting: Allergy and Immunology

## 2017-07-31 ENCOUNTER — Other Ambulatory Visit: Payer: Self-pay | Admitting: Allergy and Immunology

## 2017-07-31 ENCOUNTER — Ambulatory Visit (INDEPENDENT_AMBULATORY_CARE_PROVIDER_SITE_OTHER): Payer: Medicaid Other | Admitting: Allergy and Immunology

## 2017-07-31 VITALS — BP 140/82 | HR 84 | Resp 24

## 2017-07-31 DIAGNOSIS — K219 Gastro-esophageal reflux disease without esophagitis: Secondary | ICD-10-CM

## 2017-07-31 DIAGNOSIS — J45901 Unspecified asthma with (acute) exacerbation: Secondary | ICD-10-CM | POA: Diagnosis not present

## 2017-07-31 DIAGNOSIS — J4550 Severe persistent asthma, uncomplicated: Secondary | ICD-10-CM | POA: Diagnosis not present

## 2017-07-31 DIAGNOSIS — J3089 Other allergic rhinitis: Secondary | ICD-10-CM

## 2017-07-31 DIAGNOSIS — L219 Seborrheic dermatitis, unspecified: Secondary | ICD-10-CM

## 2017-07-31 MED ORDER — PATADAY 0.2 % OP SOLN: OPHTHALMIC | 5 refills | Status: DC

## 2017-07-31 MED ORDER — TRIAMCINOLONE ACETONIDE 0.025 % EX OINT: 1.0000 "application " | TOPICAL_OINTMENT | Freq: Two times a day (BID) | CUTANEOUS | 5 refills | Status: DC

## 2017-07-31 MED ORDER — CETIRIZINE HCL 10 MG PO TABS: ORAL_TABLET | ORAL | 5 refills | Status: DC

## 2017-07-31 MED ORDER — IPRATROPIUM BROMIDE 0.06 % NA SOLN: NASAL | 3 refills | Status: DC

## 2017-07-31 MED ORDER — FLUTICASONE PROPIONATE 50 MCG/ACT NA SUSP: NASAL | 4 refills | Status: DC

## 2017-07-31 MED ORDER — ALBUTEROL SULFATE HFA 108 (90 BASE) MCG/ACT IN AERS: INHALATION_SPRAY | RESPIRATORY_TRACT | 3 refills | Status: DC

## 2017-07-31 MED ORDER — FAMOTIDINE 20 MG PO TABS: 20.0000 mg | ORAL_TABLET | Freq: Every day | ORAL | 3 refills | Status: DC

## 2017-07-31 MED ORDER — ALBUTEROL SULFATE (2.5 MG/3ML) 0.083% IN NEBU: INHALATION_SOLUTION | RESPIRATORY_TRACT | 2 refills | Status: DC

## 2017-07-31 MED ORDER — MONTELUKAST SODIUM 10 MG PO TABS: ORAL_TABLET | ORAL | 5 refills | Status: DC

## 2017-07-31 MED ORDER — MOMETASONE FURO-FORMOTEROL FUM 200-5 MCG/ACT IN AERO: 2.0000 | INHALATION_SPRAY | Freq: Two times a day (BID) | RESPIRATORY_TRACT | 4 refills | Status: DC

## 2017-07-31 MED ORDER — EPINEPHRINE 0.3 MG/0.3ML IJ SOAJ: 0.3000 mg | Freq: Once | INTRAMUSCULAR | 3 refills | Status: DC

## 2017-07-31 MED ORDER — BECLOMETHASONE DIPROPIONATE 80 MCG/ACT IN AERS: INHALATION_SPRAY | RESPIRATORY_TRACT | 3 refills | Status: DC

## 2017-07-31 NOTE — Progress Notes (Signed)
Follow-up Note  Referring Provider: [name], MD Primary Provider: [name], MD Date of Office Visit: 07/31/2017  Subjective:   Kelsey Bullock (DOB: 04-Apr-1961) is a 57 y.o. female who returns to the [facility] [place] on 07/31/2017 in re-evaluation of the following:  HPI: [name] returns to this clinic in reevaluation of asthma, allergic rhinitis, and reflux.  I last saw her in this clinic 13 February 2017.  During the interval she has not required a systemic steroid or antibiotic to treat any type of respiratory tract issue and her requirement for short acting bronchodilator is about 3 times per week without any nocturnal use.  She does not really exercise at all.  She has not been having any problems with reflux.  He did obtain the flu vaccine  Allergies as of 07/31/2017      Reactions   Etodolac Shortness Of Breath, Anaphylaxis   REACTION: lips swelled   Penicillins Swelling, Anaphylaxis   Amoxicillin    REACTION: unspecified   Ampicillin Swelling   Augmentin [amoxicillin-pot Clavulanate]    Diclofenac Sodium    REACTION: unspecified   Iodine    REACTION: unspecified   Latex    Omeprazole    REACTION: unspecified   Other    Other reaction(s): Other (See Comments) Plastic Tape=redness   Pantoprazole Sodium    REACTION: unspecified      Medication List      acetaminophen 500 MG tablet Commonly known as:  TYLENOL Take 500 mg by mouth every 6 (six) hours as needed.   aspirin 81 MG EC tablet Take 1 tablet (81 mg total) by mouth daily. Swallow whole.   atorvastatin 40 MG tablet Commonly known as:  LIPITOR Take 1 tablet (40 mg total) by mouth daily.   benzonatate 100 MG capsule Commonly known as:  TESSALON Take 2 capsules (200 mg total) by mouth 3 (three) times daily as needed for cough.   buPROPion 100 MG tablet Commonly known as:  WELLBUTRIN Take 100 mg by mouth 2 (two) times daily.   calcium carbonate 1250 MG  capsule Take 1,250 mg by mouth 2 (two) times daily with a meal. Reported on 11/02/2015   cetirizine 10 MG tablet Commonly known as:  ZYRTEC TAKE 1 TABLET BY MOUTH EVERY DAY FOR RUNNY NOSE OR ITCHING   clonazePAM 1 MG tablet Commonly known as:  KLONOPIN Take 1 mg by mouth 2 (two) times daily.   cyclobenzaprine 10 MG tablet Commonly known as:  FLEXERIL TAKE 1 TABLET BY MOUTH AT BEDTIME AS NEEDED FOR BACK OR NECK PAIN OR SPASMS   DULERA 200-5 MCG/ACT Aero Generic drug:  mometasone-formoterol INHALE 2 PUFFS INTO THE LUNGS TWICE DAILY   Echinacea 125 MG Caps Take 1 capsule by mouth daily as needed. Reported on 11/02/2015   EPINEPHrine 0.3 mg/0.3 mL Soaj injection Commonly known as:  EPIPEN 2-PAK Inject 0.3 mLs (0.3 mg total) into the muscle once.   famotidine 20 MG tablet Commonly known as:  PEPCID TAKE 1 TABLET BY MOUTH EVERY DAY   FLUoxetine 10 MG capsule Commonly known as:  PROZAC Take 10 mg by mouth daily.   fluticasone 50 MCG/ACT nasal spray Commonly known as:  FLONASE USE 2 SPRAYS IN EACH NOSTRIL ONCE DIALY FOR STUFFY NOSE OR DRAINAGE.   furosemide 40 MG tablet Commonly known as:  LASIX Take 1 tablet (40 mg total) by mouth daily.   ibuprofen 800 MG tablet Commonly known as:  ADVIL,MOTRIN TAKE 1  TABLET BY MOUTH THREE TIMES DAILY AS NEEDED FOR PAIN   ipratropium 0.06 % nasal spray Commonly known as:  ATROVENT USE 2 SPRAYS IN EACH NOSTRIL EVERY 6 HOURS IF NEEDED TO DRY UP NOSE   lisinopril 40 MG tablet Commonly known as:  PRINIVIL,ZESTRIL TAKE 1 TABLET BY MOUTH EVERY DAY   magnesium (amino acid chelate) 133 MG tablet Take 1 tablet by mouth 2 (two) times daily. Reported on 11/02/2015   mirtazapine 45 MG tablet Commonly known as:  REMERON Take 45 mg by mouth at bedtime.   montelukast 10 MG tablet Commonly known as:  SINGULAIR TAKE 1 TABLET(10 MG) BY MOUTH AT BEDTIME   norethindrone 5 MG tablet Commonly known as:  AYGESTIN Take 2.5 mg by mouth daily.    nystatin 100000 UNIT/ML suspension Commonly known as:  MYCOSTATIN Take 5 mLs (500,000 Units total) by mouth 4 (four) times daily.   nystatin powder Generic drug:  nystatin APPLY TOPICALLY THREE TIMES DAILY   omalizumab 150 MG injection Commonly known as:  XOLAIR Inject 300 mg into the skin every 28 (twenty-eight) days.   PATADAY 0.2 % Soln Generic drug:  Olopatadine HCl INT 1 GTT IN OU D   PROAIR HFA 108 (90 Base) MCG/ACT inhaler Generic drug:  albuterol INHALE 2 PUFFS INTO THE LUNGS EVERY 4 HOURS AS NEEDED FOR WHEEZING   albuterol (2.5 MG/3ML) 0.083% nebulizer solution Commonly known as:  PROVENTIL USE 1 UNIT IN NEBULIZER EVERY 4 TO 6 HOURS AS NEEDED FOR COUGH OR WHEEZE.   QVAR 80 MCG/ACT inhaler Generic drug:  beclomethasone INHALE 2 PUFFS BY MOUTH INTO THE LUNGS TWICE DAILY   terconazole 0.4 % vaginal cream Commonly known as:  TERAZOL 7 Place 1 applicator vaginally at bedtime.   triamcinolone 0.025 % ointment Commonly known as:  KENALOG Apply 1 application topically 2 (two) times daily.   vitamin C 100 MG tablet Take 100 mg by mouth daily. Reported on 08/03/2015       Past Medical History:  Diagnosis Date  . Allergic rhinitis   . Asthma   . Congestive heart failure (CHF) ([place])   . Depression   . Gall stone   . GERD (gastroesophageal reflux disease)     Past Surgical History:  Procedure Laterality Date  . ADENOIDECTOMY    . CESAREAN SECTION    . CYST EXCISION    . OVARIAN CYST REMOVAL    . removal of gallstones    . TONSILLECTOMY    . TYMPANOSTOMY TUBE PLACEMENT      Review of systems negative except as noted in HPI / PMHx or noted below:  Review of Systems  Constitutional: Negative.   HENT: Negative.   Eyes: Negative.   Respiratory: Negative.   Cardiovascular: Negative.   Gastrointestinal: Negative.   Genitourinary: Negative.   Musculoskeletal: Negative.   Skin: Negative.   Neurological: Negative.   Endo/Heme/Allergies: Negative.    Psychiatric/Behavioral: Negative.      Objective:   Vitals:   07/31/17 1015  BP: 140/82  Pulse: 84  Resp: (!) 24          Physical Exam  Constitutional: She is well-developed, well-nourished, and in no distress.  HENT:  Head: Normocephalic.  Right Ear: Tympanic membrane, external ear and ear canal normal.  Left Ear: Tympanic membrane, external ear and ear canal normal.  Nose: Nose normal. No mucosal edema or rhinorrhea.  Mouth/Throat: Uvula is midline, oropharynx is clear and moist and mucous membranes are normal. No oropharyngeal exudate.  Eyes: Conjunctivae are normal.  Neck: Trachea normal. No tracheal tenderness present. No tracheal deviation present. No thyromegaly present.  Cardiovascular: Normal rate, regular rhythm, S1 normal, S2 normal and normal heart sounds.  No murmur heard. Pulmonary/Chest: Breath sounds normal. No stridor. No respiratory distress. She has no wheezes. She has no rales.  Musculoskeletal: She exhibits no edema.  Lymphadenopathy:       Head (right side): No tonsillar adenopathy present.       Head (left side): No tonsillar adenopathy present.    She has no cervical adenopathy.  Neurological: She is alert. Gait normal.  Skin: No rash noted. She is not diaphoretic. No erythema. Nails show no clubbing.  Psychiatric: Mood and affect normal.    Diagnostics: Spirometry was not performed secondary to a recent tooth extraction   The patient had an Asthma Control Test with the following results: ACT Total Score: 8.    Assessment and Plan:   1. Asthma, severe persistent, well-controlled   2. Other allergic rhinitis   3. LPRD (laryngopharyngeal reflux disease)   4. Asthma with acute exacerbation, unspecified asthma severity, unspecified whether persistent   5. Seborrheic eczema      1. Continue Dulera 200 2 inhalations twice a day and add Qvar 80 2 inhalations twice a day while "sick"  2. Continue montelukast 10 mg daily  3. Continue Flonase  one or 2 sprays each nostril daily  4. Continue Xolair and EpiPen  5. Continue Omeprazole 40 mg in AM plus Famotidine 20 mg in PM  6. Return to clinic in Summer 2019 or earlier if problem  Overall Ariyona has done quite well for the past 6 months and I am not going to change any of her therapy and she will continue on omalizumab and anti-inflammatory agents for respiratory tract and continue to treat reflux as noted above.  If she does well I will see her back in this clinic in summer 2019 or earlier if there is a problem.  Allena Katz, MD Allergy / Immunology [place]

## 2017-07-31 NOTE — Patient Instructions (Addendum)
  1. Continue Dulera 200 2 inhalations twice a day and add Qvar 80 2 inhalations twice a day while "sick"  2. Continue montelukast 10 mg daily  3. Continue Flonase one or 2 sprays each nostril daily  4. Continue Xolair and EpiPen  5. Continue Omeprazole 40 mg in AM plus Famotidine 20 mg in PM  6. Return to clinic in Summer 2019 or earlier if problem

## 2017-08-01 ENCOUNTER — Ambulatory Visit: Payer: Medicaid Other | Admitting: Family Medicine

## 2017-08-01 ENCOUNTER — Encounter: Payer: Self-pay | Admitting: Allergy and Immunology

## 2017-08-01 ENCOUNTER — Encounter: Payer: Self-pay | Admitting: Internal Medicine

## 2017-08-01 ENCOUNTER — Other Ambulatory Visit: Payer: Self-pay

## 2017-08-01 VITALS — BP 128/68 | HR 74 | Temp 98.8°F | Ht 66.0 in | Wt >= 6400 oz

## 2017-08-01 DIAGNOSIS — R6884 Jaw pain: Secondary | ICD-10-CM

## 2017-08-01 MED ORDER — TRAMADOL HCL 50 MG PO TABS: 50.0000 mg | ORAL_TABLET | Freq: Four times a day (QID) | ORAL | 0 refills | Status: DC | PRN

## 2017-08-01 NOTE — Patient Instructions (Signed)
It was great to meet you today! Thank you for letting me participate in your care!  Today, we discussed your left sided jaw and ear pain. Today, I saw no signs of infection. Pain after a procedure that you had is not uncommon. I have written you a prescription for Tramadol 50mg  to take every 6 hours as needed for pain.  Please follow up with your oral surgeon in the next few days.  Be well, Harolyn Rutherford, DO PGY-1, [name]

## 2017-08-06 DIAGNOSIS — R6884 Jaw pain: Secondary | ICD-10-CM | POA: Insufficient documentation

## 2017-08-06 NOTE — Progress Notes (Signed)
Subjective: Chief Complaint  Patient presents with  . Ear Pain    from wisdom tooth removal     HPI: Kelsey Bullock is a 57 y.o. presenting to clinic today to discuss the following:  Left Sided Jaw Pain: Ongoing since she had her left lower wisdom tooth removed 2 days ago. She rates the pain at a 8/10 to sometimes becoming as intense as a 10/10. She describes that pain as "pounding" and "pulsing" that is radiating to her left ear. It is worse when she eats. She reports having "a little bit of dizziness" after the surgery but no other problems.  She denies any loss of hearing, muffled hearing, or vertigo.  Review of Systems  Constitutional: Negative for chills, diaphoresis, fever and malaise/fatigue.  HENT: Positive for ear pain. Negative for congestion, hearing loss, sinus pain and sore throat.   Eyes: Negative for blurred vision and double vision.  Respiratory: Negative for cough, shortness of breath and wheezing.   Cardiovascular: Negative for chest pain and palpitations.  Gastrointestinal: Positive for nausea. Negative for abdominal pain and vomiting.  Skin: Negative for rash.  Neurological: Negative for headaches.   Health Maintenance: none today     ROS noted in HPI.   Past Medical, Surgical, Social, and Family History Reviewed & Updated per EMR.   Pertinent Historical Findings include:   Social History   Tobacco Use  Smoking Status Never Smoker  Smokeless Tobacco Never Used      Objective: BP 128/68   Pulse 74   Temp 98.8 F (37.1 C) (Oral)   Ht 5\' 6"  (1.676 m)   Wt (!) 420 lb 12.8 oz (190.9 kg)   SpO2 95%   BMI 67.92 kg/m  Vitals and nursing notes reviewed  Physical Exam  Constitutional: She is oriented to person, place, and time and well-developed, well-nourished, and in no distress. No distress.  HENT:  Head: Normocephalic and atraumatic.  Right Ear: External ear normal.  Left Ear: External ear normal.  Tympanic membranes visibile with  good light reflex, non-bulging  White inflammatory changes in bottom left posterior corner of the mouth at the gum line directly posterior to the bottom left last molar  Eyes: EOM are normal. Pupils are equal, round, and reactive to light.  Neck: Normal range of motion. Neck supple.  Cardiovascular: Normal rate, regular rhythm, normal heart sounds and intact distal pulses.  No murmur heard. Pulmonary/Chest: Effort normal and breath sounds normal. She has no wheezes. She has no rales.  Abdominal: Soft. Bowel sounds are normal. There is no tenderness.  Obese abdomen  Neurological: She is alert and oriented to person, place, and time.  Skin: Skin is warm and dry. No rash noted. No erythema.   No results found for this or any previous visit (from the past 72 hour(s)).  Assessment/Plan:  Pain in lower jaw Most likely etiology is post surgical pain from healing due to wisdom tooth extraction. Patient has reported she has only tried 2 Hydrocodone tablets since the surgery. I suspect she has gotten behind in keeping the pain controlled post surgery.  Will give her 3 days worth of Tramadol and have her take them scheduled. Advised her to stop taking Hydrocodone.   Advised patient to go back to oral surgeon if pain persists.  No evidence of infection or dry socket.  PATIENT EDUCATION PROVIDED: See AVS    Diagnosis and plan along with any newly prescribed medication(s) were discussed in detail with this patient  today. The patient verbalized understanding and agreed with the plan. Patient advised if symptoms worsen return to clinic or ER.   Health Maintainance:   No orders of the defined types were placed in this encounter.   Meds ordered this encounter  Medications  . traMADol (ULTRAM) 50 MG tablet    Sig: Take 1 tablet (50 mg total) by mouth every 6 (six) hours as needed for moderate pain (post left lower wisdom tooth extraction).    Dispense:  12 tablet    Refill:  0     Harolyn Rutherford, DO 08/06/2017, 9:59 PM PGY-1, [place]

## 2017-08-06 NOTE — Assessment & Plan Note (Signed)
Most likely etiology is post surgical pain from healing due to wisdom tooth extraction. Patient has reported she has only tried 2 Hydrocodone tablets since the surgery. I suspect she has gotten behind in keeping the pain controlled post surgery.  Will give her 3 days worth of Tramadol and have her take them scheduled. Advised her to stop taking Hydrocodone.   Advised patient to go back to oral surgeon if pain persists.  No evidence of infection or dry socket.

## 2017-08-07 ENCOUNTER — Ambulatory Visit
Admission: RE | Admit: 2017-08-07 | Discharge: 2017-08-07 | Disposition: A | Discharge status: Home / Self Care | Payer: Medicaid Other | Source: Ambulatory Visit | Attending: Family Medicine | Admitting: Family Medicine

## 2017-08-07 DIAGNOSIS — Z1231 Encounter for screening mammogram for malignant neoplasm of breast: Secondary | ICD-10-CM | POA: Diagnosis not present

## 2017-08-07 DIAGNOSIS — Z139 Encounter for screening, unspecified: Secondary | ICD-10-CM

## 2017-08-27 ENCOUNTER — Ambulatory Visit: Payer: Medicaid Other | Admitting: *Deleted

## 2017-08-27 DIAGNOSIS — J4550 Severe persistent asthma, uncomplicated: Secondary | ICD-10-CM | POA: Diagnosis not present

## 2017-09-06 ENCOUNTER — Other Ambulatory Visit: Payer: Self-pay

## 2017-09-06 ENCOUNTER — Encounter: Payer: Self-pay | Admitting: Family Medicine

## 2017-09-06 ENCOUNTER — Ambulatory Visit: Payer: Medicaid Other | Admitting: Family Medicine

## 2017-09-06 VITALS — BP 118/72 | HR 85 | Temp 98.2°F | Wt >= 6400 oz

## 2017-09-06 DIAGNOSIS — I5030 Unspecified diastolic (congestive) heart failure: Secondary | ICD-10-CM | POA: Diagnosis not present

## 2017-09-06 DIAGNOSIS — L600 Ingrowing nail: Secondary | ICD-10-CM | POA: Diagnosis present

## 2017-09-06 MED ORDER — SULFAMETHOXAZOLE-TRIMETHOPRIM 800-160 MG PO TABS: 1.0000 | ORAL_TABLET | Freq: Two times a day (BID) | ORAL | 0 refills | Status: DC

## 2017-09-06 NOTE — Progress Notes (Signed)
   Subjective:    Patient ID: Kelsey Bullock is a 57 y.o. female presenting with No chief complaint on file.  on 09/06/2017  HPI: Was seen for a pedicure in 3/13. Noted that the nail was too closely cut. Developed pain and redness. Noticed knot on 3 days ago. She has had draining pus which continues. Denies fever, chills. Doing Epsom Salt soaks and Neosporin.  Review of Systems  Constitutional: Negative for chills and fever.  Respiratory: Negative for shortness of breath.   Cardiovascular: Negative for chest pain.  Skin: Negative for rash.      Objective:    BP 118/72   Pulse 85   Temp 98.2 F (36.8 C) (Oral)   Wt (!) 413 lb (187.3 kg)   SpO2 92%   BMI 66.66 kg/m  Physical Exam  Constitutional: She is oriented to person, place, and time. No distress.  HENT:  Head: Normocephalic and atraumatic.  Eyes: No scleral icterus.  Neck: Neck supple.  Cardiovascular: Normal rate.  Pulmonary/Chest: Effort normal.  Abdominal: Soft.  Neurological: She is alert and oriented to person, place, and time.  Skin: Skin is warm and dry.  Great toe on right with erythema at medial border. Purulent drainage noted. Nail is slightly ingrown  Psychiatric: She has a normal mood and affect.        Assessment & Plan:   Problem List Items Addressed This Visit      Unprioritized   Diastolic CHF with preserved left ventricular function, NYHA class 2 ([place])    Had questions surrounding this and what it meant. Explained this in detail, overwork of heart, importance of excellent BP control, management of weight in control and prevention of worsening. Also, discussed B-blocker as treatment, but not good option for her due to chronic breathing issues.       Other Visit Diagnoses    Ingrown toenail of right foot with infection    -  Primary   trial of Septra ds, allergic to PCN. Continue soaks and attempt to remove nail from skin. Possible need for partial removal discussed.   Relevant Medications     sulfamethoxazole-trimethoprim (BACTRIM DS,SEPTRA DS) 800-160 MG tablet      Total face-to-face time with patient: 15 minutes. Over 50% of encounter was spent on counseling and coordination of care. Return in about 1 month (around 10/04/2017) for a follow-up.  Donnamae Jude 09/06/2017 2:13 PM

## 2017-09-06 NOTE — Assessment & Plan Note (Signed)
Had questions surrounding this and what it meant. Explained this in detail, overwork of heart, importance of excellent BP control, management of weight in control and prevention of worsening. Also, discussed B-blocker as treatment, but not good option for her due to chronic breathing issues.

## 2017-09-06 NOTE — Patient Instructions (Addendum)
Skin Abscess A skin abscess is an infected area on or under your skin that contains pus and other material. An abscess can happen almost anywhere on your body. Some abscesses break open (rupture) on their own. Most continue to get worse unless they are treated. The infection can spread deeper into the body and into your blood, which can make you feel sick. Treatment usually involves draining the abscess. Follow these instructions at home: Abscess Care  If you have an abscess that has not drained, place a warm, clean, wet washcloth over the abscess several times a day. Do this as told by your doctor.  Follow instructions from your doctor about how to take care of your abscess. Make sure you: ? Cover the abscess with a bandage (dressing). ? Change your bandage or gauze as told by your doctor. ? Wash your hands with soap and water before you change the bandage or gauze. If you cannot use soap and water, use hand sanitizer.  Check your abscess every day for signs that the infection is getting worse. Check for: ? More redness, swelling, or pain. ? More fluid or blood. ? Warmth. ? More pus or a bad smell. Medicines   Take over-the-counter and prescription medicines only as told by your doctor.  If you were prescribed an antibiotic medicine, take it as told by your doctor. Do not stop taking the antibiotic even if you start to feel better. General instructions  To avoid spreading the infection: ? Do not share personal care items, towels, or hot tubs with others. ? Avoid making skin-to-skin contact with other people.  Keep all follow-up visits as told by your doctor. This is important. Contact a doctor if:  You have more redness, swelling, or pain around your abscess.  You have more fluid or blood coming from your abscess.  Your abscess feels warm when you touch it.  You have more pus or a bad smell coming from your abscess.  You have a fever.  Your muscles ache.  You have  chills.  You feel sick. Get help right away if:  You have very bad (severe) pain.  You see red streaks on your skin spreading away from the abscess. This information is not intended to replace advice given to you by your health care provider. Make sure you discuss any questions you have with your health care provider. Document Released: 11/08/2007 Document Revised: 01/16/2016 Document Reviewed: 03/31/2015 Elsevier Interactive Patient Education  2018 [place] An ingrown toenail occurs when the corner or sides of your toenail grow into the surrounding skin. The big toe is most commonly affected, but it can happen to any of your toes. If your ingrown toenail is not treated, you will be at risk for infection. What are the causes? This condition may be caused by:  Wearing shoes that are too small or tight.  Injury or trauma, such as stubbing your toe or having your toe stepped on.  Improper cutting or care of your toenails.  Being born with (congenital) nail or foot abnormalities, such as having a nail that is too big for your toe.  What increases the risk? Risk factors for an ingrown toenail include:  Age. Your nails tend to thicken as you get older, so ingrown nails are more common in older people.  Diabetes.  Cutting your toenails incorrectly.  Blood circulation problems.  What are the signs or symptoms? Symptoms may include:  Pain, soreness, or tenderness.  Redness.  Swelling.  Hardening of the skin surrounding the toe.  Your ingrown toenail may be infected if there is fluid, pus, or drainage. How is this diagnosed? An ingrown toenail may be diagnosed by medical history and physical exam. If your toenail is infected, your health care provider may test a sample of the drainage. How is this treated? Treatment depends on the severity of your ingrown toenail. Some ingrown toenails may be treated at home. More severe or infected ingrown toenails may  require surgery to remove all or part of the nail. Infected ingrown toenails may also be treated with antibiotic medicines. Follow these instructions at home:  If you were prescribed an antibiotic medicine, finish all of it even if you start to feel better.  Soak your foot in warm soapy water for 20 minutes, 3 times per day or as directed by your health care provider.  Carefully lift the edge of the nail away from the sore skin by wedging a small piece of cotton under the corner of the nail. This may help with the pain. Be careful not to cause more injury to the area.  Wear shoes that fit well. If your ingrown toenail is causing you pain, try wearing sandals, if possible.  Trim your toenails regularly and carefully. Do not cut them in a curved shape. Cut your toenails straight across. This prevents injury to the skin at the corners of the toenail.  Keep your feet clean and dry.  If you are having trouble walking and are given crutches by your health care provider, use them as directed.  Do not pick at your toenail or try to remove it yourself.  Take medicines only as directed by your health care provider.  Keep all follow-up visits as directed by your health care provider. This is important. Contact a health care provider if:  Your symptoms do not improve with treatment. Get help right away if:  You have red streaks that start at your foot and go up your leg.  You have a fever.  You have increased redness, swelling, or pain.  You have fluid, blood, or pus coming from your toenail. This information is not intended to replace advice given to you by your health care provider. Make sure you discuss any questions you have with your health care provider. Document Released: 05/19/2000 Document Revised: 10/22/2015 Document Reviewed: 04/15/2014 Elsevier Interactive Patient Education  [employer].

## 2017-09-24 ENCOUNTER — Ambulatory Visit (INDEPENDENT_AMBULATORY_CARE_PROVIDER_SITE_OTHER): Payer: Medicaid Other | Admitting: *Deleted

## 2017-09-24 DIAGNOSIS — J4540 Moderate persistent asthma, uncomplicated: Secondary | ICD-10-CM

## 2017-10-22 ENCOUNTER — Ambulatory Visit (INDEPENDENT_AMBULATORY_CARE_PROVIDER_SITE_OTHER): Payer: Medicaid Other | Admitting: *Deleted

## 2017-10-22 DIAGNOSIS — J4540 Moderate persistent asthma, uncomplicated: Secondary | ICD-10-CM

## 2017-11-06 ENCOUNTER — Other Ambulatory Visit: Payer: Self-pay | Admitting: Internal Medicine

## 2017-11-06 DIAGNOSIS — B370 Candidal stomatitis: Secondary | ICD-10-CM

## 2017-11-19 ENCOUNTER — Ambulatory Visit (INDEPENDENT_AMBULATORY_CARE_PROVIDER_SITE_OTHER): Payer: Medicaid Other

## 2017-11-19 DIAGNOSIS — J4540 Moderate persistent asthma, uncomplicated: Secondary | ICD-10-CM

## 2017-12-17 ENCOUNTER — Ambulatory Visit: Payer: Self-pay | Admitting: *Deleted

## 2017-12-17 DIAGNOSIS — J4540 Moderate persistent asthma, uncomplicated: Secondary | ICD-10-CM

## 2018-01-14 ENCOUNTER — Ambulatory Visit (INDEPENDENT_AMBULATORY_CARE_PROVIDER_SITE_OTHER): Payer: Medicaid Other

## 2018-01-14 DIAGNOSIS — J4540 Moderate persistent asthma, uncomplicated: Secondary | ICD-10-CM

## 2018-01-16 ENCOUNTER — Telehealth: Payer: Self-pay | Admitting: Family Medicine

## 2018-01-16 NOTE — Telephone Encounter (Signed)
Spoke with patient and she needs handicap form completed before 01/17/18.  Was planning to have it completed, but her appt had to be rescheduled since Dr. Andria Frames will be out of the office.  Patient will bring form by today in case Dr. Andria Frames is able to complete it before he is out of the office. Burna Forts, BSN, RN-BC

## 2018-01-16 NOTE — Telephone Encounter (Signed)
Renewal of disability parking placard form dropped off at front desk for completion.  Verified that patient section of form has been completed.  Last DOS/WCC with PCP was 09/06/2017.  Placed form in team folder to be completed by clinical staff.  Kelsey Bullock

## 2018-01-16 NOTE — Telephone Encounter (Signed)
Pt called to reschedule her appointment with Dr. Andria Frames. She needs her forms for her handicap sticker signed so she can renew it. She planned on bringing that with her to her appointment. She is rescheduled but the appointment is after her paperwork is due. Pt would like for someone to contact her letting her know what she should do.

## 2018-01-17 ENCOUNTER — Ambulatory Visit: Payer: Medicaid Other | Admitting: Family Medicine

## 2018-01-17 NOTE — Telephone Encounter (Signed)
Clinical info completed on Handicap form.  Place form in Dr. Lowella Bandy box for completion.  Kelsey Bullock, Hadleigh Felber D, [place]

## 2018-01-21 NOTE — Telephone Encounter (Signed)
Pt informed that the form was ready for pickup.   Copy placed Batch scanning [place], Salome Spotted, CMA

## 2018-01-31 ENCOUNTER — Ambulatory Visit: Payer: Medicaid Other | Admitting: Family Medicine

## 2018-02-11 ENCOUNTER — Ambulatory Visit: Payer: Self-pay | Admitting: *Deleted

## 2018-02-11 DIAGNOSIS — J4540 Moderate persistent asthma, uncomplicated: Secondary | ICD-10-CM | POA: Diagnosis not present

## 2018-02-18 ENCOUNTER — Other Ambulatory Visit: Payer: Self-pay | Admitting: Family Medicine

## 2018-02-18 DIAGNOSIS — B370 Candidal stomatitis: Secondary | ICD-10-CM

## 2018-02-21 ENCOUNTER — Ambulatory Visit: Payer: Medicaid Other | Admitting: Family Medicine

## 2018-02-21 ENCOUNTER — Other Ambulatory Visit: Payer: Self-pay

## 2018-02-21 ENCOUNTER — Encounter: Payer: Self-pay | Admitting: Family Medicine

## 2018-02-21 VITALS — BP 116/70 | HR 82 | Temp 98.2°F | Ht 66.0 in | Wt >= 6400 oz

## 2018-02-21 DIAGNOSIS — K635 Polyp of colon: Secondary | ICD-10-CM

## 2018-02-21 DIAGNOSIS — L304 Erythema intertrigo: Secondary | ICD-10-CM | POA: Diagnosis not present

## 2018-02-21 DIAGNOSIS — E1169 Type 2 diabetes mellitus with other specified complication: Secondary | ICD-10-CM | POA: Diagnosis not present

## 2018-02-21 DIAGNOSIS — E1140 Type 2 diabetes mellitus with diabetic neuropathy, unspecified: Secondary | ICD-10-CM

## 2018-02-21 DIAGNOSIS — Z23 Encounter for immunization: Secondary | ICD-10-CM

## 2018-02-21 DIAGNOSIS — E669 Obesity, unspecified: Secondary | ICD-10-CM

## 2018-02-21 DIAGNOSIS — L0591 Pilonidal cyst without abscess: Secondary | ICD-10-CM | POA: Diagnosis not present

## 2018-02-21 LAB — POCT GLYCOSYLATED HEMOGLOBIN (HGB A1C): HbA1c, POC (controlled diabetic range): 6.9 % (ref 0.0–7.0)

## 2018-02-21 MED ORDER — NYSTATIN 100000 UNIT/GM EX POWD: CUTANEOUS | 2 refills | Status: DC

## 2018-02-21 NOTE — Patient Instructions (Signed)
Three shots today Flu shot Pneumonia vaccine Tetanus shot. It is likely a pilonidal cyst, but because it is draining on its own, no treatment is needed.   You diabetes is still at goal but slowly trending up.  Next visit (6 months) I will likely need to start medications.   Good luck with Kelsey Bullock.  I hope it works out.

## 2018-02-22 DIAGNOSIS — L0591 Pilonidal cyst without abscess: Secondary | ICD-10-CM | POA: Insufficient documentation

## 2018-02-22 NOTE — Progress Notes (Signed)
   Subjective:    Patient ID: Kelsey Bullock, female    DOB: Oct 16, 1960, 57 y.o.   MRN: [id number]  HPI  Acute buttocks cyst plus chronic issues. Has a now draining, painful sore on her buttocks at midline.  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  Was worse 4 day ago but has markedly improved since it began draining.  DM "diet controled"  A1C still less than 7  Not successful at wt loss.  No other complaints.  HPDP due for flu, pneumovax and tetanus.  Give all.  Tetanus needed today because of draining skin lesion. Also due for diabetic foot exam. Also due for colonscopy.     Review of Systems     Objective:   Physical Exam Diabetic foot exam done.   Lungs clear Cardiac RRR without m or g Abd benign Buttocks.  Midline draining cyst at 12 o'clock  No surrrounding redness.        Assessment & Plan:

## 2018-02-22 NOTE — Assessment & Plan Note (Signed)
May benefit from metformin for metabolic syndrome even though she is at goal for diabetes.  Neuropathy demonstrated on foot exam.

## 2018-02-22 NOTE — Assessment & Plan Note (Signed)
GI referral entered

## 2018-02-22 NOTE — Assessment & Plan Note (Signed)
Location of draining cyst is typical of pilonidal cyst.  First recurrence in years.  Not currently infected.  Conservative Rx for now.  Surgical referral if recurrent

## 2018-02-22 NOTE — Assessment & Plan Note (Addendum)
Contributing to her diabetes.  Wt loss has been unsuccessful thus far

## 2018-02-28 ENCOUNTER — Telehealth: Payer: Self-pay

## 2018-02-28 NOTE — Telephone Encounter (Signed)
Pt Kelsey Bullock on nurse line stating she was referred to [facility] and they need a copy of her last colonoscopy before they can see her. Please fax  This to [employer]. No other information given.

## 2018-03-04 NOTE — Telephone Encounter (Signed)
Faxed colonoscopy report to [place]. Katharina Caper, April D, [place]

## 2018-03-04 NOTE — Telephone Encounter (Signed)
Copied colonoscopy and path reports from [facility] [place], [id number]   OPERATIVE PROCEDURE REPORT - 01/29/2012  PATIENT: Kelsey Bullock, Kelsey Bullock. MR #: [id number] BIRTHDATE: 1961/03/16 GENDER: Female ATTENDING: Jerl Santos M.D. REFERRING MD: Mickie Bail MD  Napoleon Form M.D. ASSISTANT: Dema Severin R.N.    PROCEDURE: 1.colonoscopy and biopsy  INDICATIONS: 1.Screening, average risk patient for colon cancer  MEDICATIONS: 1.See Anesthesia Report.  DESCRIPTION OF PROCEDURE: After the risks benefits and alternatives of the procedure were thoroughly explained, Informed consent was obtained.A digital rectal exam was performed and revealed external hemorrhoids.The [id number] endoscope was introduced through the anus and advanced to the cecum, which was identified by both the appendix and ileocecal valve.The prep was good.The instrument was then slowly withdrawn as the colon was fully examined.     The scope was passed to the cecum with visualization of the IC valve and the appendiceal orifice.There was a single 80mm transverse colon polyp, removed with biopsy forceps.No other masses, polyps, AVM, or diverticula seen.A retroflexed view of the rectum was normal.The scope was then completely withdrawn from the patient and the procedure terminated.  LIMITATIONS: without limitations COMPLICATIONS: There were no complications.  ENDOSCOPIC IMPRESSION: Colon polyp, removed with biopsy forceps   RECOMMENDATION: Follow up pathology Follow up with Dr Toney Rakes as previously scheduled PATHOLOGY: Pathology - Biopsy Taken REPEAT EXAM:    CPT CODES: 81017 COLONOSCOPY, FLEXIBLE, PROXIMAL TO SPLENIC FLEXURE; WITH BIOPSY, SINGLE OR MULTIPLE  ICD9  CODES: 1.V76.51 Special screening for malignant neoplasms, colon 2.455.5 External hemorrhoids with other complication   The ICD and CPT codes recommended by this software are interpretations from the data that the clinical staff has captured with the software.The verification of the translation of this report to the ICD and CPT codes and modifiers is the sole responsibility of the health care institution and practicing physician where this report was generated.[place] [place]. will not be held responsible for the validity of the ICD and CPT codes included on this report. AMA assumes no liability for data contained or not contained herein. CPT is a Designer, television/film set of the [employer].  __________________________________ Jerl Santos M.D. eSigned:Jerry Amalia Hailey M.D. 01/29/2012 10:42 AM    This procedure was performed jointly by Reather Laurence M.D. and Jerl Santos M.D. who was present for the entire procedure.  Kelsey Bullock, Kelsey Bullock [id number]    ACCESSION IDPOEU:[id number]  RECEIVED: 01/29/2012  ORDERING PHYSICIAN:JOHN ALFRED EVANS , MD  PATIENT NAME:Kelsey Bullock, Kelsey Bullock  SURGICAL PATHOLOGY REPORT    FINAL PATHOLOGIC DIAGNOSIS  MICROSCOPIC EXAMINATION AND DIAGNOSIS    COLON, (CLINICALLY POLYP), BIOPSY:  Tubular adenoma.      I have personally reviewed the slides and/or other related  materials referenced, and have edited the report as part of my  pathologic assessment and final interpretation.    Electronically Signed Out By: Osborne Oman , MD  01/30/2012 14:48:11    ajg/ces    Specimen(s) Received  Colon polyp    Clinical History  Chief complaint: colon polyp   Status

## 2018-03-11 ENCOUNTER — Ambulatory Visit: Payer: Self-pay

## 2018-03-11 DIAGNOSIS — J4550 Severe persistent asthma, uncomplicated: Secondary | ICD-10-CM

## 2018-03-13 DIAGNOSIS — F329 Major depressive disorder, single episode, unspecified: Secondary | ICD-10-CM | POA: Diagnosis not present

## 2018-03-13 DIAGNOSIS — F4310 Post-traumatic stress disorder, unspecified: Secondary | ICD-10-CM | POA: Diagnosis not present

## 2018-03-13 DIAGNOSIS — F429 Obsessive-compulsive disorder, unspecified: Secondary | ICD-10-CM | POA: Diagnosis not present

## 2018-03-13 DIAGNOSIS — Z79899 Other long term (current) drug therapy: Secondary | ICD-10-CM | POA: Diagnosis not present

## 2018-03-15 ENCOUNTER — Telehealth: Payer: Self-pay | Admitting: Gastroenterology

## 2018-03-18 ENCOUNTER — Encounter: Payer: Self-pay | Admitting: Gastroenterology

## 2018-03-19 ENCOUNTER — Encounter: Payer: Self-pay | Admitting: Allergy and Immunology

## 2018-03-19 ENCOUNTER — Ambulatory Visit: Payer: Medicaid Other | Admitting: Allergy and Immunology

## 2018-03-19 VITALS — BP 130/80 | HR 80 | Resp 20

## 2018-03-19 DIAGNOSIS — L219 Seborrheic dermatitis, unspecified: Secondary | ICD-10-CM | POA: Diagnosis not present

## 2018-03-19 DIAGNOSIS — J4550 Severe persistent asthma, uncomplicated: Secondary | ICD-10-CM

## 2018-03-19 DIAGNOSIS — J3089 Other allergic rhinitis: Secondary | ICD-10-CM | POA: Diagnosis not present

## 2018-03-19 DIAGNOSIS — G4733 Obstructive sleep apnea (adult) (pediatric): Secondary | ICD-10-CM

## 2018-03-19 DIAGNOSIS — Z6841 Body Mass Index (BMI) 40.0 and over, adult: Secondary | ICD-10-CM

## 2018-03-19 DIAGNOSIS — E662 Morbid (severe) obesity with alveolar hypoventilation: Secondary | ICD-10-CM | POA: Diagnosis not present

## 2018-03-19 DIAGNOSIS — K219 Gastro-esophageal reflux disease without esophagitis: Secondary | ICD-10-CM

## 2018-03-19 MED ORDER — TRIAMCINOLONE ACETONIDE 0.025 % EX OINT: 1.0000 "application " | TOPICAL_OINTMENT | Freq: Two times a day (BID) | CUTANEOUS | 5 refills | Status: DC

## 2018-03-19 MED ORDER — FLUTICASONE PROPIONATE 50 MCG/ACT NA SUSP: NASAL | 4 refills | Status: DC

## 2018-03-19 MED ORDER — BECLOMETHASONE DIPROPIONATE 80 MCG/ACT IN AERS: INHALATION_SPRAY | RESPIRATORY_TRACT | 3 refills | Status: DC

## 2018-03-19 MED ORDER — PATADAY 0.2 % OP SOLN: OPHTHALMIC | 5 refills | Status: DC

## 2018-03-19 MED ORDER — MONTELUKAST SODIUM 10 MG PO TABS: ORAL_TABLET | ORAL | 5 refills | Status: DC

## 2018-03-19 MED ORDER — MOMETASONE FURO-FORMOTEROL FUM 200-5 MCG/ACT IN AERO: 2.0000 | INHALATION_SPRAY | Freq: Two times a day (BID) | RESPIRATORY_TRACT | 4 refills | Status: DC

## 2018-03-19 NOTE — Progress Notes (Signed)
Follow-up Note  Referring Provider: [name], MD Primary Provider: [name], MD Date of Office Visit: 03/19/2018  Subjective:   Kelsey Bullock (DOB: [birth year]) is a 57 y.o. female who returns to [facility] on 03/19/2018 in re-evaluation of the following:  HPI: Kelsey Bullock returns to this clinic in reevaluation of asthma and allergic rhinitis and reflux and untreated sleep apnea.  Her last visit to this clinic was 31 July 2017.  She has really done very well during the interval and has not had any significant issues that have developed that have required the administration of either a systemic steroid or antibiotic to treat a respiratory tract issue.  She is no longer using Qvar but relies on the use of Dulera as her sole controller inhaler and continues on montelukast and Flonase and omalizumab.  She does not really exert herself to any large degree and her requirement for short acting bronchodilator is less than twice a week at this point.  Her reflux is under excellent control at this point without any issues.  She still does not treat her sleep apnea.  She did receive the flu vaccine this year.  Her seborrheic dermatitis appears to be under good control with very rare and intermittent use of a topical steroid.  Allergies as of 03/19/2018      Reactions   Etodolac Shortness Of Breath, Anaphylaxis   REACTION: lips swelled   Penicillins Swelling, Anaphylaxis   Amoxicillin    REACTION: unspecified   Ampicillin Swelling   Augmentin [amoxicillin-pot Clavulanate]    Diclofenac Sodium    REACTION: unspecified   Iodine    REACTION: unspecified   Latex    Omeprazole    REACTION: unspecified   Other    Other reaction(s): Other (See Comments) Plastic Tape=redness   Pantoprazole Sodium    REACTION: unspecified      Medication List      acetaminophen 500 MG tablet Commonly known as:  TYLENOL Take 500 mg by mouth every 6 (six) hours  as needed.   albuterol 108 (90 Base) MCG/ACT inhaler Commonly known as:  PROVENTIL HFA;VENTOLIN HFA INHALE 2 PUFFS INTO THE LUNGS EVERY 4 HOURS AS NEEDED FOR WHEEZING   albuterol (2.5 MG/3ML) 0.083% nebulizer solution Commonly known as:  PROVENTIL USE 1 VIAL VIA NEBULIZER EVERY 4 TO 6 HOURS AS NEEDED FOR COUGH OR WHEEZING   aspirin 81 MG EC tablet Take 1 tablet (81 mg total) by mouth daily. Swallow whole.   atorvastatin 40 MG tablet Commonly known as:  LIPITOR Take 1 tablet (40 mg total) by mouth daily.   beclomethasone 80 MCG/ACT inhaler Commonly known as:  QVAR INHALE 2 PUFFS BY MOUTH INTO THE LUNGS TWICE DAILY   benzonatate 100 MG capsule Commonly known as:  TESSALON Take 2 capsules (200 mg total) by mouth 3 (three) times daily as needed for cough.   buPROPion 100 MG tablet Commonly known as:  WELLBUTRIN Take 100 mg by mouth 2 (two) times daily.   calcium carbonate 1250 MG capsule Take 1,250 mg by mouth 2 (two) times daily with a meal. Reported on 11/02/2015   cetirizine 10 MG tablet Commonly known as:  ZYRTEC TAKE 1 TABLET BY MOUTH EVERY DAY FOR RUNNY NOSE OR ITCHING   clonazePAM 1 MG tablet Commonly known as:  KLONOPIN Take 1 mg by mouth 2 (two) times daily.   cyclobenzaprine 10 MG tablet Commonly known as:  FLEXERIL TAKE 1 TABLET BY MOUTH  AT BEDTIME AS NEEDED FOR BACK OR NECK PAIN OR SPASMS   Echinacea 125 MG Caps Take 1 capsule by mouth daily as needed. Reported on 11/02/2015   EPINEPHrine 0.3 mg/0.3 mL Soaj injection Commonly known as:  EPI-PEN Inject 0.3 mLs (0.3 mg total) into the muscle once for 1 dose.   famotidine 20 MG tablet Commonly known as:  PEPCID Take 1 tablet (20 mg total) by mouth daily.   FLUoxetine 10 MG capsule Commonly known as:  PROZAC Take 10 mg by mouth daily.   fluticasone 50 MCG/ACT nasal spray Commonly known as:  FLONASE USE 2 SPRAYS IN EACH NOSTRIL ONCE DIALY FOR STUFFY NOSE OR DRAINAGE.   furosemide 40 MG tablet Commonly  known as:  LASIX Take 1 tablet (40 mg total) by mouth daily.   ibuprofen 800 MG tablet Commonly known as:  ADVIL,MOTRIN TAKE 1 TABLET BY MOUTH THREE TIMES DAILY AS NEEDED FOR PAIN   ipratropium 0.06 % nasal spray Commonly known as:  ATROVENT USE 2 SPRAYS IN EACH NOSTRIL EVERY 6 HOURS IF NEEDED TO DRY UP NOSE   lisinopril 40 MG tablet Commonly known as:  PRINIVIL,ZESTRIL TAKE 1 TABLET BY MOUTH EVERY DAY   magnesium (amino acid chelate) 133 MG tablet Take 1 tablet by mouth 2 (two) times daily. Reported on 11/02/2015   mirtazapine 45 MG tablet Commonly known as:  REMERON Take 45 mg by mouth at bedtime.   mometasone-formoterol 200-5 MCG/ACT Aero Commonly known as:  DULERA Inhale 2 puffs into the lungs 2 (two) times daily.   montelukast 10 MG tablet Commonly known as:  SINGULAIR TAKE 1 TABLET(10 MG) BY MOUTH AT BEDTIME   norethindrone 5 MG tablet Commonly known as:  AYGESTIN Take 2.5 mg by mouth daily.   nystatin 100000 UNIT/ML suspension Commonly known as:  MYCOSTATIN TAKE 5 MILLILITERS BY MOUTH 4 TIMES DAILY   nystatin powder Commonly known as:  MYCOSTATIN/NYSTOP APPLY TOPICALLY THREE TIMES DAILY   omalizumab 150 MG injection Commonly known as:  XOLAIR Inject 300 mg into the skin every 28 (twenty-eight) days.   PATADAY 0.2 % Soln Generic drug:  Olopatadine HCl INT 1 GTT IN OU D   sulfamethoxazole-trimethoprim 800-160 MG tablet Commonly known as:  BACTRIM DS,SEPTRA DS Take 1 tablet by mouth 2 (two) times daily.   terconazole 0.4 % vaginal cream Commonly known as:  TERAZOL 7 Place 1 applicator vaginally at bedtime.   traMADol 50 MG tablet Commonly known as:  ULTRAM Take 1 tablet (50 mg total) by mouth every 6 (six) hours as needed for moderate pain (post left lower wisdom tooth extraction).   triamcinolone 0.025 % ointment Commonly known as:  KENALOG Apply 1 application topically 2 (two) times daily.   vitamin C 100 MG tablet Take 100 mg by mouth daily.  Reported on 08/03/2015       Past Medical History:  Diagnosis Date  . Allergic rhinitis   . Asthma   . Congestive heart failure (CHF) ([place])   . Depression   . Gall stone   . GERD (gastroesophageal reflux disease)     Past Surgical History:  Procedure Laterality Date  . ADENOIDECTOMY    . CESAREAN SECTION    . CYST EXCISION    . OVARIAN CYST REMOVAL    . removal of gallstones    . TONSILLECTOMY    . TYMPANOSTOMY TUBE PLACEMENT      Review of systems negative except as noted in HPI / PMHx or noted below:  Review  of Systems  Constitutional: Negative.   HENT: Negative.   Eyes: Negative.   Respiratory: Negative.   Cardiovascular: Negative.   Gastrointestinal: Negative.   Genitourinary: Negative.   Musculoskeletal: Negative.   Skin: Negative.   Neurological: Negative.   Endo/Heme/Allergies: Negative.   Psychiatric/Behavioral: Negative.      Objective:   Vitals:   03/19/18 1055  BP: 130/80  Pulse: 80  Resp: 20          Physical Exam  HENT:  Head: Normocephalic.  Right Ear: Tympanic membrane, external ear and ear canal normal.  Left Ear: Tympanic membrane, external ear and ear canal normal.  Nose: Nose normal. No mucosal edema or rhinorrhea.  Mouth/Throat: Uvula is midline, oropharynx is clear and moist and mucous membranes are normal. No oropharyngeal exudate.  Eyes: Conjunctivae are normal.  Neck: Trachea normal. No tracheal tenderness present. No tracheal deviation present. No thyromegaly present.  Cardiovascular: Normal rate, regular rhythm, S1 normal, S2 normal and normal heart sounds.  No murmur heard. Pulmonary/Chest: Breath sounds normal. No stridor. No respiratory distress. She has no wheezes. She has no rales.  Musculoskeletal: She exhibits no edema.  Lymphadenopathy:       Head (right side): No tonsillar adenopathy present.       Head (left side): No tonsillar adenopathy present.    She has no cervical adenopathy.  Neurological: She is alert.    Skin: No rash noted. She is not diaphoretic. No erythema. Nails show no clubbing.    Diagnostics:    Spirometry was performed and demonstrated an FEV1 of 1.67 at 58 % of predicted.  Assessment and Plan:   1. Asthma, severe persistent, well-controlled   2. Other allergic rhinitis   3. LPRD (laryngopharyngeal reflux disease)   4. Obstructive sleep apnea syndrome   5. Seborrheic eczema   6. Class 3 obesity with alveolar hypoventilation, serious comorbidity, and body mass index (BMI) of 50.0 to 59.9 in adult ([place])      1. Continue Dulera 200 2 inhalations twice a day   2. Continue montelukast 10 mg daily  3. Continue Flonase one or 2 sprays each nostril daily  4. Continue Xolair and EpiPen  5. Continue Omeprazole 40 mg in AM plus Famotidine 20 mg in PM  6. Add Qvar 80 Redihaler 2 inhalations twice a day to [facility] while "sick"  7.  Use pro-air HFA and antihistamine and triamcinolone 0.025% ointment if needed  8. Return to clinic in January 2020 or earlier if problem  Overall Laikyn appears to be doing relatively well with her airway issue and her reflux issue on her current medical plan and she will continue to utilize a combination of anti-inflammatory agents for her airway as well as consistent use of omalizumab and aggressive therapy directed against reflux.  I will regroup with her in January 2020 or earlier if there is a problem.  Allena Katz, MD Allergy / Immunology [place]

## 2018-03-19 NOTE — Patient Instructions (Addendum)
  1. Continue Dulera 200 2 inhalations twice a day   2. Continue montelukast 10 mg daily  3. Continue Flonase one or 2 sprays each nostril daily  4. Continue Xolair and EpiPen  5. Continue Omeprazole 40 mg in AM plus Famotidine 20 mg in PM  6. Add Qvar 80 Redihaler 2 inhalations twice a day to [facility] while "sick"  7.  Use pro-air HFA and antihistamine and triamcinolone 0.025% ointment if needed  8. Return to clinic in January 2020 or earlier if problem

## 2018-03-20 ENCOUNTER — Encounter: Payer: Self-pay | Admitting: Allergy and Immunology

## 2018-03-21 ENCOUNTER — Telehealth: Payer: Self-pay

## 2018-03-21 NOTE — Telephone Encounter (Signed)
Prior authorization has been requested by insurance. Approved and faxed to pharmacy. Copy of approval scanned into chart.

## 2018-03-22 ENCOUNTER — Telehealth: Payer: Self-pay

## 2018-03-22 NOTE — Telephone Encounter (Signed)
Dr. Silverio Decamp,  Kelsey Bullock is scheduled for [facility] 03/28/18 and a colonoscopy 04/12/18. In preparing her chart for [facility] I noticed that her BMI is 67.2 and her weight is 416 lbs  Patient is diabetic and has CHF. Do you want her to have an office visit or a direct hospital ? She is a direct screening. Please advise. Thanks!    Riki Sheer, LPN ( [facility] )

## 2018-03-25 NOTE — Telephone Encounter (Signed)
Office visit, next available with either me or APP. Thanks

## 2018-03-26 NOTE — Telephone Encounter (Signed)
Patient was contacted and rescheduled for an OV with Janett Billow on Thursday 03/28/18. [facility] colonoscopy was cancelled, and patient will be scheduled for a hospital colonoscopy, or discuss other alternatives at the [facility]. Patient was informed that she needed an OV due to health issues. Patient verbalizes understanding.   Riki Sheer, LPN ( [facility] )

## 2018-03-28 ENCOUNTER — Encounter: Payer: Self-pay | Admitting: Gastroenterology

## 2018-03-28 ENCOUNTER — Ambulatory Visit: Payer: Medicaid Other | Admitting: Gastroenterology

## 2018-03-28 VITALS — BP 138/80 | HR 83 | Ht 64.5 in | Wt >= 6400 oz

## 2018-03-28 DIAGNOSIS — K529 Noninfective gastroenteritis and colitis, unspecified: Secondary | ICD-10-CM

## 2018-03-28 DIAGNOSIS — Z860100 Personal history of colon polyps, unspecified: Secondary | ICD-10-CM | POA: Insufficient documentation

## 2018-03-28 MED ORDER — NA SULFATE-K SULFATE-MG SULF 17.5-3.13-1.6 GM/177ML PO SOLN: ORAL | 0 refills | Status: DC

## 2018-03-28 NOTE — Patient Instructions (Signed)
If you are age 57 or older, your body mass index should be between 23-30. Your Body mass index is 70.05 kg/m. If this is out of the aforementioned range listed, please consider follow up with your Primary Care Provider.  If you are age 25 or younger, your body mass index should be between 19-25. Your Body mass index is 70.05 kg/m. If this is out of the aformentioned range listed, please consider follow up with your Primary Care Provider.   You have been scheduled for a colonoscopy. Please follow written instructions given to you at your visit today.  Please pick up your prep supplies at the pharmacy within the next 1-3 days. If you use inhalers (even only as needed), please bring them with you on the day of your procedure. Your physician has requested that you go to www.startemmi.com and enter the access code given to you at your visit today. This web site gives a general overview about your procedure. However, you should still follow specific instructions given to you by our office regarding your preparation for the procedure.  We have sent the following medications to your pharmacy for you to pick up at your convenience: Suprep  Thank you for choosing me and [facility].    Alonza Bogus, PA-C

## 2018-03-28 NOTE — Progress Notes (Signed)
03/28/2018 Kelsey Bullock [id number] 03-15-1961   HISTORY OF PRESENT ILLNESS: This is a 57 year old female who is here today to discuss surveillance colonoscopy.  Her last colonoscopy was performed in August 2013 at [facility] at which time she was found to have one 4 millimeter polyp removed.  This was a tubular adenoma and repeat was recommended in 4 years.  She has a BMI of 70.  Needs procedure [facility].  She does have diabetes, but hemoglobin A1c was 6.5.  Has asthma/chronic bronchitis, but says that overall it is mild.  Has some degree of CHF, but her ejection fraction is 60 to 65%.  She denies any acute or new GI issues.  She says that she has had IBS all her life.  When she was very young she reports having issues with constipation and rectal bleeding.  Now for many years she is been battling diarrhea/loose stools.  She says that she has occasional solid stools, but most of the time they are loose or liquid.  She denies seeing any blood.  She admits to anywhere from probably 2-4 bowel movements per day on average.   Past Medical History:  Diagnosis Date  . Allergic rhinitis   . Asthma   . Congestive heart failure (CHF) ([place])   . Depression   . Gall stone   . GERD (gastroesophageal reflux disease)    Past Surgical History:  Procedure Laterality Date  . ADENOIDECTOMY    . CESAREAN SECTION    . CYST EXCISION    . OVARIAN CYST REMOVAL    . removal of gallstones    . TONSILLECTOMY    . TYMPANOSTOMY TUBE PLACEMENT      reports that she has never smoked. She has never used smokeless tobacco. She reports that she does not drink alcohol or use drugs. family history includes Diabetes in her mother and paternal uncle. Allergies  Allergen Reactions  . Etodolac Shortness Of Breath and Anaphylaxis    REACTION: lips swelled  . Penicillins Swelling and Anaphylaxis  . Amoxicillin     REACTION: unspecified  . Ampicillin Swelling  . Augmentin [Amoxicillin-Pot Clavulanate]    . Diclofenac Sodium     REACTION: unspecified  . Iodine     REACTION: unspecified  . Latex   . Omeprazole     REACTION: unspecified  . Other     Other reaction(s): Other (See Comments) Plastic Tape=redness  . Pantoprazole Sodium     REACTION: unspecified      Outpatient Encounter Medications as of 03/28/2018  Medication Sig  . acetaminophen (TYLENOL) 500 MG tablet Take 500 mg by mouth every 6 (six) hours as needed.  Marland Kitchen albuterol (PROAIR HFA) 108 (90 Base) MCG/ACT inhaler INHALE 2 PUFFS INTO THE LUNGS EVERY 4 HOURS AS NEEDED FOR WHEEZING  . albuterol (PROVENTIL) (2.5 MG/3ML) 0.083% nebulizer solution USE 1 VIAL VIA NEBULIZER EVERY 4 TO 6 HOURS AS NEEDED FOR COUGH OR WHEEZING  . Ascorbic Acid (VITAMIN C) 100 MG tablet Take 100 mg by mouth daily as needed. Reported on 08/03/2015  . aspirin 81 MG EC tablet Take 1 tablet (81 mg total) by mouth daily. Swallow whole.  Marland Kitchen atorvastatin (LIPITOR) 40 MG tablet Take 1 tablet (40 mg total) by mouth daily.  . beclomethasone (QVAR) 80 MCG/ACT inhaler INHALE 2 PUFFS BY MOUTH INTO THE LUNGS TWICE DAILY  . benzonatate (TESSALON) 100 MG capsule Take 2 capsules (200 mg total) by mouth 3 (three) times daily as needed for  cough.  . buPROPion (WELLBUTRIN) 100 MG tablet Take 100 mg by mouth 2 (two) times daily.   . calcium carbonate 1250 MG capsule Take 1,250 mg by mouth 2 (two) times daily with a meal. Reported on 11/02/2015  . cetirizine (ZYRTEC) 10 MG tablet TAKE 1 TABLET BY MOUTH EVERY DAY FOR RUNNY NOSE OR ITCHING  . clonazePAM (KLONOPIN) 1 MG tablet Take 1 mg by mouth 2 (two) times daily.    . cyclobenzaprine (FLEXERIL) 10 MG tablet TAKE 1 TABLET BY MOUTH AT BEDTIME AS NEEDED FOR BACK OR NECK PAIN OR SPASMS (Patient taking differently: as needed. )  . Echinacea 125 MG CAPS Take 1 capsule by mouth daily as needed. Reported on 11/02/2015  . famotidine (PEPCID) 20 MG tablet Take 1 tablet (20 mg total) by mouth daily. (Patient taking differently: Take 20 mg by  mouth daily as needed. )  . FLUoxetine (PROZAC) 10 MG capsule Take 10 mg by mouth daily.  . fluticasone (FLONASE) 50 MCG/ACT nasal spray USE 2 SPRAYS IN EACH NOSTRIL ONCE DIALY FOR STUFFY NOSE OR DRAINAGE.  . furosemide (LASIX) 40 MG tablet Take 1 tablet (40 mg total) by mouth daily.  Marland Kitchen ibuprofen (ADVIL,MOTRIN) 800 MG tablet TAKE 1 TABLET BY MOUTH THREE TIMES DAILY AS NEEDED FOR PAIN  . ipratropium (ATROVENT) 0.06 % nasal spray USE 2 SPRAYS IN EACH NOSTRIL EVERY 6 HOURS IF NEEDED TO DRY UP NOSE  . lisinopril (PRINIVIL,ZESTRIL) 40 MG tablet TAKE 1 TABLET BY MOUTH EVERY DAY  . mirtazapine (REMERON) 45 MG tablet Take 45 mg by mouth at bedtime.   . mometasone-formoterol (DULERA) 200-5 MCG/ACT AERO Inhale 2 puffs into the lungs 2 (two) times daily.  . montelukast (SINGULAIR) 10 MG tablet TAKE 1 TABLET(10 MG) BY MOUTH AT BEDTIME  . norethindrone (AYGESTIN) 5 MG tablet Take 2.5 mg by mouth daily.   Marland Kitchen nystatin (MYCOSTATIN) 100000 UNIT/ML suspension TAKE 5 MILLILITERS BY MOUTH 4 TIMES DAILY  . nystatin (NYSTATIN) powder APPLY TOPICALLY THREE TIMES DAILY  . omalizumab (XOLAIR) 150 MG injection Inject 300 mg into the skin every 28 (twenty-eight) days.  Marland Kitchen PATADAY 0.2 % SOLN INT 1 GTT IN OU D  . Specialty Vitamins Products (MAGNESIUM, AMINO ACID CHELATE,) 133 MG tablet Take 1 tablet by mouth 2 (two) times daily. Reported on 11/02/2015  . sulfamethoxazole-trimethoprim (BACTRIM DS,SEPTRA DS) 800-160 MG tablet Take 1 tablet by mouth 2 (two) times daily.  Marland Kitchen terconazole (TERAZOL 7) 0.4 % vaginal cream Place 1 applicator vaginally at bedtime.  . traMADol (ULTRAM) 50 MG tablet Take 1 tablet (50 mg total) by mouth every 6 (six) hours as needed for moderate pain (post left lower wisdom tooth extraction).  . triamcinolone (KENALOG) 0.025 % ointment Apply 1 application topically 2 (two) times daily.  Marland Kitchen EPINEPHrine (EPIPEN 2-PAK) 0.3 mg/0.3 mL IJ SOAJ injection Inject 0.3 mLs (0.3 mg total) into the muscle once for 1  dose.   Facility-Administered Encounter Medications as of 03/28/2018  Medication  . omalizumab [name]) injection 300 mg     REVIEW OF SYSTEMS  : All other systems reviewed and negative except where noted in the History of Present Illness.   PHYSICAL EXAM: BP 138/80   Pulse 83   Ht 5' 4.5" (1.638 m)   Wt (!) 414 lb 8 oz (188 kg)   BMI 70.05 kg/m  General: Well developed white female in no acute distress; morbidly obese Head: Normocephalic and atraumatic Eyes:  Sclerae anicteric, conjunctiva pink. Ears: Normal auditory acuity Lungs:  Clear throughout to auscultation; no increased WOB. Heart: Regular rate and rhythm; no M/R/G. Abdomen: Soft, obese, non-distended.  BS present.  Non-tender. Rectal:  Will be done at the time of colonoscopy. Musculoskeletal: Symmetrical with no gross deformities  Skin: No lesions on visible extremities Extremities: No edema  Neurological: Alert oriented x 4, grossly non-focal Psychological:  Alert and cooperative. Normal mood and affect  ASSESSMENT AND PLAN: *Personal history of adenomatous colon polyp in 2013:  Will schedule for colonoscopy with Dr. Silverio Decamp at [facility] hospital due to her weight/BMI. *Chronic diarrhea:  Likely IBS as she has had bowel issues all of there life, but may be worth doing biopsies to rule out microscopic colitis. *Morbid obesity:  BMI 70.  **The risks, benefits, and alternatives to colonoscopy were discussed with the patient and she consents to proceed.   CC:  [name], MD

## 2018-03-29 ENCOUNTER — Other Ambulatory Visit: Payer: Self-pay | Admitting: Family Medicine

## 2018-03-29 DIAGNOSIS — I5030 Unspecified diastolic (congestive) heart failure: Secondary | ICD-10-CM

## 2018-04-02 ENCOUNTER — Other Ambulatory Visit: Payer: Self-pay | Admitting: Allergy and Immunology

## 2018-04-04 ENCOUNTER — Telehealth: Payer: Self-pay | Admitting: Gastroenterology

## 2018-04-04 NOTE — Telephone Encounter (Signed)
Pt wants to know if there is any way that she can have colon at [facility] before 05/27/18. Pls call her.

## 2018-04-04 NOTE — Telephone Encounter (Signed)
The pt is aware that there are no other appt availabilities and she can call to see if we have had any cancellation.

## 2018-04-08 ENCOUNTER — Ambulatory Visit: Payer: Self-pay

## 2018-04-08 DIAGNOSIS — J4540 Moderate persistent asthma, uncomplicated: Secondary | ICD-10-CM

## 2018-04-12 ENCOUNTER — Encounter: Payer: Medicaid Other | Admitting: Gastroenterology

## 2018-04-15 NOTE — Progress Notes (Signed)
Reviewed and agree with documentation and assessment and plan. K. Veena Nandigam , MD   

## 2018-04-18 ENCOUNTER — Telehealth: Payer: Self-pay | Admitting: Allergy and Immunology

## 2018-04-18 DIAGNOSIS — J45901 Unspecified asthma with (acute) exacerbation: Secondary | ICD-10-CM

## 2018-04-18 MED ORDER — EPINEPHRINE 0.3 MG/0.3ML IJ SOAJ: 0.3000 mg | Freq: Once | INTRAMUSCULAR | 3 refills | Status: DC

## 2018-04-18 MED ORDER — ALBUTEROL SULFATE (2.5 MG/3ML) 0.083% IN NEBU: INHALATION_SOLUTION | RESPIRATORY_TRACT | 1 refills | Status: DC

## 2018-04-18 NOTE — Telephone Encounter (Signed)
Refill sent in called patient left message advising refills sent in

## 2018-04-18 NOTE — Telephone Encounter (Signed)
Pt called and needs to have epi-pen and proventil for nebulizer called in to [facility] on [address]    [phone].

## 2018-04-24 DIAGNOSIS — H40033 Anatomical narrow angle, bilateral: Secondary | ICD-10-CM | POA: Diagnosis not present

## 2018-04-24 DIAGNOSIS — H0012 Chalazion right lower eyelid: Secondary | ICD-10-CM | POA: Diagnosis not present

## 2018-04-24 DIAGNOSIS — H47323 Drusen of optic disc, bilateral: Secondary | ICD-10-CM | POA: Diagnosis not present

## 2018-04-25 ENCOUNTER — Telehealth: Payer: Self-pay | Admitting: Gastroenterology

## 2018-04-25 NOTE — Telephone Encounter (Signed)
Spoke with the patient. No earlier hospital openings will be available. Discussed her concerns. She was concerned she would not be able to participate in holiday celebrations. Reviewed what to expect on the day of the procedure and the day afterwards. She states she feels better about the procedure now.

## 2018-05-06 ENCOUNTER — Ambulatory Visit (INDEPENDENT_AMBULATORY_CARE_PROVIDER_SITE_OTHER): Payer: Medicaid Other | Admitting: *Deleted

## 2018-05-06 DIAGNOSIS — J4540 Moderate persistent asthma, uncomplicated: Secondary | ICD-10-CM

## 2018-05-22 ENCOUNTER — Telehealth: Payer: Self-pay | Admitting: Gastroenterology

## 2018-05-22 NOTE — Telephone Encounter (Signed)
Pt has question about her pre op instructions.

## 2018-05-22 NOTE — Telephone Encounter (Signed)
Discussed clear liquids, red and purple food coloring and roughage.

## 2018-05-22 NOTE — Telephone Encounter (Signed)
Left message to call back  

## 2018-05-24 ENCOUNTER — Other Ambulatory Visit: Payer: Self-pay

## 2018-05-24 ENCOUNTER — Encounter (HOSPITAL_COMMUNITY): Payer: Self-pay | Admitting: *Deleted

## 2018-05-27 ENCOUNTER — Encounter (HOSPITAL_COMMUNITY): Payer: Self-pay | Admitting: *Deleted

## 2018-05-27 ENCOUNTER — Ambulatory Visit (HOSPITAL_COMMUNITY): Payer: Medicaid Other | Admitting: Certified Registered"

## 2018-05-27 ENCOUNTER — Other Ambulatory Visit: Payer: Self-pay

## 2018-05-27 ENCOUNTER — Ambulatory Visit (HOSPITAL_COMMUNITY)
Admission: RE | Admit: 2018-05-27 | Discharge: 2018-05-27 | Disposition: A | Discharge status: Home / Self Care | Payer: Medicaid Other | Attending: Gastroenterology | Admitting: Gastroenterology

## 2018-05-27 ENCOUNTER — Encounter (HOSPITAL_COMMUNITY)
Admission: RE | Disposition: A | Discharge status: Home / Self Care | Payer: Self-pay | Source: Home / Self Care | Attending: Gastroenterology

## 2018-05-27 DIAGNOSIS — M797 Fibromyalgia: Secondary | ICD-10-CM | POA: Insufficient documentation

## 2018-05-27 DIAGNOSIS — Z1211 Encounter for screening for malignant neoplasm of colon: Secondary | ICD-10-CM | POA: Diagnosis not present

## 2018-05-27 DIAGNOSIS — Z791 Long term (current) use of non-steroidal anti-inflammatories (NSAID): Secondary | ICD-10-CM | POA: Insufficient documentation

## 2018-05-27 DIAGNOSIS — Z86010 Personal history of colonic polyps: Secondary | ICD-10-CM | POA: Diagnosis not present

## 2018-05-27 DIAGNOSIS — D122 Benign neoplasm of ascending colon: Secondary | ICD-10-CM | POA: Diagnosis not present

## 2018-05-27 DIAGNOSIS — Z85038 Personal history of other malignant neoplasm of large intestine: Secondary | ICD-10-CM | POA: Insufficient documentation

## 2018-05-27 DIAGNOSIS — K648 Other hemorrhoids: Secondary | ICD-10-CM | POA: Insufficient documentation

## 2018-05-27 DIAGNOSIS — F329 Major depressive disorder, single episode, unspecified: Secondary | ICD-10-CM | POA: Insufficient documentation

## 2018-05-27 DIAGNOSIS — D123 Benign neoplasm of transverse colon: Secondary | ICD-10-CM | POA: Insufficient documentation

## 2018-05-27 DIAGNOSIS — G4730 Sleep apnea, unspecified: Secondary | ICD-10-CM | POA: Diagnosis not present

## 2018-05-27 DIAGNOSIS — Z7982 Long term (current) use of aspirin: Secondary | ICD-10-CM | POA: Diagnosis not present

## 2018-05-27 DIAGNOSIS — J45909 Unspecified asthma, uncomplicated: Secondary | ICD-10-CM | POA: Diagnosis not present

## 2018-05-27 DIAGNOSIS — K621 Rectal polyp: Secondary | ICD-10-CM | POA: Insufficient documentation

## 2018-05-27 DIAGNOSIS — I509 Heart failure, unspecified: Secondary | ICD-10-CM | POA: Insufficient documentation

## 2018-05-27 DIAGNOSIS — I110 Hypertensive heart disease with heart failure: Secondary | ICD-10-CM | POA: Diagnosis not present

## 2018-05-27 DIAGNOSIS — Z6841 Body Mass Index (BMI) 40.0 and over, adult: Secondary | ICD-10-CM | POA: Diagnosis not present

## 2018-05-27 DIAGNOSIS — Z79899 Other long term (current) drug therapy: Secondary | ICD-10-CM | POA: Diagnosis not present

## 2018-05-27 DIAGNOSIS — K219 Gastro-esophageal reflux disease without esophagitis: Secondary | ICD-10-CM | POA: Insufficient documentation

## 2018-05-27 DIAGNOSIS — Z860101 Personal history of adenomatous and serrated colon polyps: Secondary | ICD-10-CM

## 2018-05-27 HISTORY — PX: BIOPSY: SHX5522

## 2018-05-27 HISTORY — PX: POLYPECTOMY: SHX5525

## 2018-05-27 HISTORY — PX: COLONOSCOPY WITH PROPOFOL: SHX5780

## 2018-05-27 HISTORY — DX: Other complications of anesthesia, initial encounter: T88.59XA

## 2018-05-27 HISTORY — DX: Nausea with vomiting, unspecified: Z98.890

## 2018-05-27 HISTORY — DX: Adverse effect of unspecified anesthetic, initial encounter: T41.45XA

## 2018-05-27 HISTORY — DX: Nausea with vomiting, unspecified: R11.2

## 2018-05-27 SURGERY — COLONOSCOPY WITH PROPOFOL
Anesthesia: Monitor Anesthesia Care

## 2018-05-27 MED ORDER — PROPOFOL 500 MG/50ML IV EMUL
INTRAVENOUS | Status: DC | PRN
  Administered 2018-05-27: 100 ug/kg/min via INTRAVENOUS

## 2018-05-27 MED ORDER — SODIUM CHLORIDE 0.9 % IV SOLN: INTRAVENOUS | Status: DC

## 2018-05-27 MED ORDER — LACTATED RINGERS IV SOLN
INTRAVENOUS | Status: DC
  Administered 2018-05-27: 10:00:00 via INTRAVENOUS

## 2018-05-27 MED ORDER — PROPOFOL 10 MG/ML IV BOLUS
INTRAVENOUS | Status: DC | PRN
  Administered 2018-05-27: 10 mg via INTRAVENOUS
  Administered 2018-05-27: 20 mg via INTRAVENOUS
  Administered 2018-05-27 (×2): 10 mg via INTRAVENOUS
  Administered 2018-05-27 (×2): 20 mg via INTRAVENOUS

## 2018-05-27 SURGICAL SUPPLY — 22 items

## 2018-05-27 NOTE — Anesthesia Procedure Notes (Signed)
Date/Time: 05/27/2018 10:18 AM Performed by: Niel Hummer, CRNA Pre-anesthesia Checklist: Patient identified, Emergency Drugs available, Suction available and Patient being monitored Patient Re-evaluated:Patient Re-evaluated prior to induction Oxygen Delivery Method: Simple face mask

## 2018-05-27 NOTE — Discharge Instructions (Signed)
Colonoscopy, Adult, Care After °This sheet gives you information about how to care for yourself after your procedure. Your doctor may also give you more specific instructions. If you have problems or questions, call your doctor. °What can I expect after the procedure? °After the procedure, it is common to have: °· A small amount of blood in your poop for 24 hours. °· Some gas. °· Mild cramping or bloating in your belly. °Follow these instructions at home: °General instructions °· For the first 24 hours after the procedure: °? Do not drive or use machinery. °? Do not sign important documents. °? Do not drink alcohol. °? Do your daily activities more slowly than normal. °? Eat foods that are soft and easy to digest. °· Take over-the-counter or prescription medicines only as told by your doctor. °To help cramping and bloating: ° °· Try walking around. °· Put heat on your belly (abdomen) as told by your doctor. Use a heat source that your doctor recommends, such as a moist heat pack or a heating pad. °? Put a towel between your skin and the heat source. °? Leave the heat on for 20-30 minutes. °? Remove the heat if your skin turns bright red. This is especially important if you cannot feel pain, heat, or cold. You can get burned. °Eating and drinking ° °· Drink enough fluid to keep your pee (urine) clear or pale yellow. °· Return to your normal diet as told by your doctor. Avoid heavy or fried foods that are hard to digest. °· Avoid drinking alcohol for as long as told by your doctor. °Contact a doctor if: °· You have blood in your poop (stool) 2-3 days after the procedure. °Get help right away if: °· You have more than a small amount of blood in your poop. °· You see large clumps of tissue (blood clots) in your poop. °· Your belly is swollen. °· You feel sick to your stomach (nauseous). °· You throw up (vomit). °· You have a fever. °· You have belly pain that gets worse, and medicine does not help your  pain. °Summary °· After the procedure, it is common to have a small amount of blood in your poop. You may also have mild cramping and bloating in your belly. °· For the first 24 hours after the procedure, do not drive or use machinery, do not sign important documents, and do not drink alcohol. °· Get help right away if you have a lot of blood in your poop, feel sick to your stomach, have a fever, or have more belly pain. °This information is not intended to replace advice given to you by your health care provider. Make sure you discuss any questions you have with your health care provider. °Document Released: 06/24/2010 Document Revised: 03/22/2017 Document Reviewed: 02/14/2016 °Elsevier Interactive Patient Education © 2019 [employer] ° °

## 2018-05-27 NOTE — Op Note (Signed)
[facility] Patient Name: Kelsey Bullock Procedure Date: 05/27/2018 MRN: [id number] Attending MD: Mauri Pole , MD Date of Birth: [birth year] CSN: [id number] Age: 57 Admit Type: Outpatient Procedure:                Colonoscopy Indications:              High risk colon cancer surveillance: Personal                            history of colonic polyps, High risk colon cancer                            surveillance: Personal history of adenoma less than                            10 mm in size, Last colonoscopy: 2013 Providers:                Mauri Pole, MD, Elmer Ramp. Tilden Dome, RN,                            William Dalton, Technician Referring MD:              Medicines:                Monitored Anesthesia Care Complications:            No immediate complications. Estimated Blood Loss:     Estimated blood loss was minimal. Procedure:                Pre-Anesthesia Assessment:                           - Prior to the procedure, a History and Physical                            was performed, and patient medications and                            allergies were reviewed. The patient's tolerance of                            previous anesthesia was also reviewed. The risks                            and benefits of the procedure and the sedation                            options and risks were discussed with the patient.                            All questions were answered, and informed consent                            was obtained. Prior Anticoagulants: The patient has  taken no previous anticoagulant or antiplatelet                            agents. ASA Grade Assessment: III - A patient with                            severe systemic disease. After reviewing the risks                            and benefits, the patient was deemed in                            satisfactory condition to undergo the procedure.                            After obtaining informed consent, the colonoscope                            was passed under direct vision. Throughout the                            procedure, the patient's blood pressure, pulse, and                            oxygen saturations were monitored continuously. The                            CF-HQ190L ([id number]) Olympus adult colonoscope was                            introduced through the anus and advanced to the the                            cecum, identified by appendiceal orifice and                            ileocecal valve. The patient tolerated the                            procedure well. The quality of the bowel                            preparation was good. The ileocecal valve,                            appendiceal orifice, and rectum were photographed.                            The colonoscopy was performed with moderate                            difficulty due to the patient's body habitus. Scope In: 10:24:08 AM Scope Out: 10:55:46 AM Scope Withdrawal Time: 0 hours 25 minutes 9 seconds  Total Procedure Duration:  0 hours 31 minutes 38 seconds  Findings:      The perianal and digital rectal examinations were normal.      Two sessile polyps were found in the transverse colon and ascending       colon. The polyps were 1 to 2 mm in size. These polyps were removed with       a cold biopsy forceps. Resection and retrieval were complete.      Three sessile polyps were found in the rectum and transverse colon. The       polyps were 4 to 6 mm in size. These polyps were removed with a cold       snare. Resection and retrieval were complete.      Non-bleeding internal hemorrhoids were found during retroflexion. The       hemorrhoids were large. Impression:               - Two 1 to 2 mm polyps in the transverse colon and                            in the ascending colon, removed with a cold biopsy                            forceps. Resected and  retrieved.                           - Three 4 to 6 mm polyps in the rectum and in the                            transverse colon, removed with a cold snare.                            Resected and retrieved.                           - Non-bleeding internal hemorrhoids. Moderate Sedation:      Not Applicable - Patient had care per Anesthesia. Recommendation:           - Patient has a contact number available for                            emergencies. The signs and symptoms of potential                            delayed complications were discussed with the                            patient. Return to normal activities tomorrow.                            Written discharge instructions were provided to the                            patient.                           - Resume previous diet.                           -  Continue present medications.                           - Await pathology results.                           - Repeat colonoscopy in 3 - 5 years for                            surveillance based on pathology results. Procedure Code(s):        --- Professional ---                           [phone], Colonoscopy, flexible; with removal of                            tumor(s), polyp(s), or other lesion(s) by snare                            technique                           [id number], 15, Colonoscopy, flexible; with biopsy,                            single or multiple Diagnosis Code(s):        --- Professional ---                           D12.2, Benign neoplasm of ascending colon                           K62.1, Rectal polyp                           D12.3, Benign neoplasm of transverse colon (hepatic                            flexure or splenic flexure)                           K64.8, Other hemorrhoids                           Z86.010, Personal history of colonic polyps CPT copyright 2018 American Medical Association. All rights reserved. The codes documented in this  report are preliminary and upon coder review may  be revised to meet current compliance requirements. Mauri Pole, MD 05/27/2018 11:00:59 AM This report has been signed electronically. Number of Addenda: 0

## 2018-05-27 NOTE — Anesthesia Preprocedure Evaluation (Addendum)
Anesthesia Evaluation  Patient identified by MRN, date of birth, ID band Patient awake    Reviewed: Allergy & Precautions, NPO status , Patient's Chart, lab work & pertinent test results  History of Anesthesia Complications (+) PONV  Airway Mallampati: II  TM Distance: >3 FB Neck ROM: Full    Dental  (+) Teeth Intact, Dental Advisory Given   Pulmonary asthma , sleep apnea (noncompliant with CPAP) ,    Pulmonary exam normal breath sounds clear to auscultation       Cardiovascular hypertension, Pt. on medications +CHF  Normal cardiovascular exam Rhythm:Regular Rate:Normal  TTE 12/2016: EF 28-78%, grade 1 diastolic dysfunction   Neuro/Psych Anxiety Depression Pseudotumor cerebri negative neurological ROS     GI/Hepatic Neg liver ROS, GERD  Medicated and Controlled,  Endo/Other  diabetes, Type 2Morbid obesity (BMI 70)  Renal/GU negative Renal ROS     Musculoskeletal  (+) Arthritis , Fibromyalgia -  Abdominal   Peds  Hematology negative hematology ROS (+)   Anesthesia Other Findings Day of surgery medications reviewed with the patient.  Reproductive/Obstetrics                            Anesthesia Physical Anesthesia Plan  ASA: III  Anesthesia Plan: MAC   Post-op Pain Management:    Induction:   PONV Risk Score and Plan: 3 and Treatment may vary due to age or medical condition and Propofol infusion  Airway Management Planned: Natural Airway and Nasal Cannula  Additional Equipment:   Intra-op Plan:   Post-operative Plan:   Informed Consent: I have reviewed the patients History and Physical, chart, labs and discussed the procedure including the risks, benefits and alternatives for the proposed anesthesia with the patient or authorized representative who has indicated his/her understanding and acceptance.     Plan Discussed with: CRNA  Anesthesia Plan Comments:         Anesthesia Quick Evaluation

## 2018-05-27 NOTE — Transfer of Care (Signed)
Immediate Anesthesia Transfer of Care Note  Patient: Kelsey Bullock  Procedure(s) Performed: COLONOSCOPY WITH PROPOFOL (N/A )  Patient Location: PACU  Anesthesia Type:MAC  Level of Consciousness: awake, alert  and oriented  Airway & Oxygen Therapy: Patient Spontanous Breathing and Patient connected to face mask oxygen  Post-op Assessment: Report given to RN and Post -op Vital signs reviewed and stable  Post vital signs: Reviewed and stable  Last Vitals:  Vitals Value Taken Time  BP    Temp    Pulse    Resp    SpO2      Last Pain:  Vitals:   05/27/18 0950  TempSrc: Oral  PainSc: 0-No pain         Complications: No apparent anesthesia complications

## 2018-05-27 NOTE — Anesthesia Postprocedure Evaluation (Signed)
Anesthesia Post Note  Patient: Kelsey Bullock  Procedure(s) Performed: COLONOSCOPY WITH PROPOFOL (N/A )     Patient location during evaluation: PACU Anesthesia Type: MAC Level of consciousness: awake and alert Pain management: pain level controlled Vital Signs Assessment: post-procedure vital signs reviewed and stable Respiratory status: spontaneous breathing, nonlabored ventilation and respiratory function stable Cardiovascular status: stable and blood pressure returned to baseline Postop Assessment: no apparent nausea or vomiting Anesthetic complications: no    Last Vitals:  Vitals:   05/27/18 0950 05/27/18 1105  BP: (!) 141/61 129/64  Pulse: 64 81  Resp: 16 (!) 23  Temp: 36.8 C   SpO2: 94% 100%    Last Pain:  Vitals:   05/27/18 0950  TempSrc: Oral  PainSc: 0-No pain                 Brennan Bailey

## 2018-05-27 NOTE — H&P (Signed)
[facility] History and Physical   Primary Care Physician:  [name], MD   Reason for Procedure:  Colon surveillance with history of colon polyps (tubular adenoma)  Plan:    Colonoscopy with possible intervention     HPI: Kelsey Bullock is a 57 y.o. female with multiple comorbidities including obesity with BMI over 70, 4 mm tubular adenoma removed at [facility] in 2013 here for surveillance colonoscopy.   Past Medical History:  Diagnosis Date  . Allergic rhinitis   . Asthma   . Complication of anesthesia   . Congestive heart failure (CHF) ([place])   . Depression   . Gall stone   . GERD (gastroesophageal reflux disease)   . PONV (postoperative nausea and vomiting)     Past Surgical History:  Procedure Laterality Date  . ADENOIDECTOMY    . CESAREAN SECTION    . CYST EXCISION    . OVARIAN CYST REMOVAL    . removal of gallstones    . TONSILLECTOMY    . TYMPANOSTOMY TUBE PLACEMENT      Prior to Admission medications   Medication Sig Start Date End Date Taking? Authorizing Provider  albuterol (PROAIR HFA) 108 (90 Base) MCG/ACT inhaler INHALE 2 PUFFS INTO THE LUNGS EVERY 4 HOURS AS NEEDED FOR WHEEZING 07/31/17  Yes Kozlow, Donnamarie Poag, MD  albuterol (PROVENTIL) (2.5 MG/3ML) 0.083% nebulizer solution USE 1 VIAL VIA NEBULIZER EVERY 4 TO 6 HOURS AS NEEDED FOR COUGH OR WHEEZING 04/18/18  Yes Kozlow, Donnamarie Poag, MD  Ascorbic Acid (VITAMIN C) 100 MG tablet Take 300 mg by mouth daily as needed (immune support).    Yes [provider]  aspirin 81 MG EC tablet Take 1 tablet (81 mg total) by mouth daily. Swallow whole. 03/29/12  Yes Hensel, [name], MD  atorvastatin (LIPITOR) 40 MG tablet Take 1 tablet (40 mg total) by mouth daily. Patient taking differently: Take 40 mg by mouth at bedtime.  04/04/17  Yes Hensel, [name], MD  beclomethasone (QVAR) 80 MCG/ACT inhaler INHALE 2 PUFFS BY MOUTH INTO THE LUNGS TWICE DAILY Patient taking differently: Inhale 2  puffs into the lungs 2 (two) times daily as needed (sick).  03/19/18  Yes Kozlow, Donnamarie Poag, MD  calcium carbonate (TUMS - DOSED IN MG ELEMENTAL CALCIUM) 500 MG chewable tablet Chew 2-3 tablets by mouth daily as needed for indigestion or heartburn.   Yes [provider]  cetirizine (ZYRTEC) 10 MG tablet TAKE 1 TABLET BY MOUTH EVERY DAY FOR RUNNY NOSE OR ITCHING Patient taking differently: Take 10 mg by mouth at bedtime.  07/31/17  Yes Kozlow, Donnamarie Poag, MD  clonazePAM (KLONOPIN) 1 MG tablet Take 1 mg by mouth at bedtime.    Yes [provider]  cyclobenzaprine (FLEXERIL) 10 MG tablet TAKE 1 TABLET BY MOUTH AT BEDTIME AS NEEDED FOR BACK OR NECK PAIN OR SPASMS Patient taking differently: Take 10 mg by mouth at bedtime as needed for muscle spasms.  03/14/17  Yes Hensel, [name], MD  erythromycin ophthalmic ointment Place 1 application into both eyes at bedtime.   Yes [provider]  famotidine (PEPCID) 20 MG tablet Take 1 tablet (20 mg total) by mouth daily. Patient taking differently: Take 20 mg by mouth daily as needed for heartburn.  07/31/17  Yes Kozlow, Donnamarie Poag, MD  fluconazole (DIFLUCAN) 100 MG tablet Take 100 mg by mouth daily as needed (yeast infections).   Yes [provider]  fluticasone (FLONASE) 50 MCG/ACT nasal spray  USE 2 SPRAYS IN EACH NOSTRIL ONCE DIALY FOR STUFFY NOSE OR DRAINAGE. Patient taking differently: Place 2 sprays into both nostrils at bedtime.  03/19/18  Yes Kozlow, Donnamarie Poag, MD  furosemide (LASIX) 40 MG tablet TAKE 1 TABLET(40 MG) BY MOUTH TWICE DAILY Patient taking differently: Take 40 mg by mouth daily as needed for edema.  03/29/18  Yes Hensel, [name], MD  ibuprofen (ADVIL,MOTRIN) 800 MG tablet TAKE 1 TABLET BY MOUTH THREE TIMES DAILY AS NEEDED FOR PAIN 04/07/13  Yes Hensel, [name], MD  ipratropium (ATROVENT) 0.06 % nasal spray USE 2 SPRAYS IN EACH NOSTRIL EVERY 6 HOURS IF NEEDED TO DRY UP NOSE 07/31/17  Yes Kozlow, Donnamarie Poag, MD  lisinopril  (PRINIVIL,ZESTRIL) 40 MG tablet TAKE 1 TABLET BY MOUTH EVERY DAY 07/17/17  Yes Hensel, [name], MD  Magnesium 250 MG TABS Take 250 mg by mouth daily.   Yes [provider]  mirtazapine (REMERON) 45 MG tablet Take 45 mg by mouth at bedtime.  10/13/15  Yes [provider]  mometasone-formoterol (DULERA) 200-5 MCG/ACT AERO Inhale 2 puffs into the lungs 2 (two) times daily. Patient taking differently: Inhale 2 puffs into the lungs See admin instructions. Inhale 2 puffs every night, may do a second 2 puff dose in the morning as needed if feeling sick 03/19/18  Yes Kozlow, Donnamarie Poag, MD  montelukast (SINGULAIR) 10 MG tablet TAKE 1 TABLET(10 MG) BY MOUTH AT BEDTIME 03/19/18  Yes Kozlow, Donnamarie Poag, MD  Na Sulfate-K Sulfate-Mg Sulf 17.5-3.13-1.6 GM/177ML SOLN Suprep-Use as directed 03/28/18  Yes Zehr, Laban Emperor, PA-C  norethindrone (AYGESTIN) 5 MG tablet Take 2.5 mg by mouth at bedtime.    Yes [provider]  nystatin (MYCOSTATIN) 100000 UNIT/ML suspension TAKE 5 MILLILITERS BY MOUTH 4 TIMES DAILY Patient taking differently: Use as directed 5 mLs in the mouth or throat 4 (four) times daily as needed (thrush).  11/06/17  Yes Hensel, [name], MD  nystatin (NYSTATIN) powder APPLY TOPICALLY THREE TIMES DAILY Patient taking differently: Apply 1 g topically 3 (three) times daily as needed (rash).  02/21/18  Yes Hensel, [name], MD  omalizumab [name]) 150 MG injection Inject 300 mg into the skin every 28 (twenty-eight) days. 07/18/17  Yes Kozlow, Donnamarie Poag, MD  PATADAY 0.2 % SOLN INT 1 GTT IN OU D Patient taking differently: Place 1 drop into both eyes at bedtime.  03/19/18  Yes Kozlow, Donnamarie Poag, MD  terconazole (TERAZOL 7) 0.4 % vaginal cream Place 1 applicator vaginally at bedtime. Patient taking differently: Place 1 applicator vaginally at bedtime as needed (yeast infections).  07/09/17  Yes Hensel, [name], MD  triamcinolone (KENALOG) 0.025 % ointment Apply 1 application topically 2 (two)  times daily. Patient taking differently: Apply 1 application topically 2 (two) times daily as needed (irritation).  03/19/18  Yes Kozlow, Donnamarie Poag, MD  acetaminophen (TYLENOL) 500 MG tablet Take 1,000 mg by mouth every 6 (six) hours as needed for moderate pain or headache.    [provider]  benzonatate (TESSALON) 100 MG capsule Take 2 capsules (200 mg total) by mouth 3 (three) times daily as needed for cough. Patient taking differently: Take 100 mg by mouth 3 (three) times daily as needed for cough.  03/22/17   Verner Mould, MD  CINNAMON PO Take 1 tablet by mouth daily as needed (for high bp).    [provider]  Echinacea 125 MG CAPS Take 125 mg by mouth daily as needed (immune support).  [provider]  EPINEPHrine (EPIPEN 2-PAK) 0.3 mg/0.3 mL IJ SOAJ injection Inject 0.3 mLs (0.3 mg total) into the muscle once for 1 dose. 04/18/18 05/21/18  Kozlow, Donnamarie Poag, MD    Current Facility-Administered Medications  Medication Dose Route Frequency Provider Last Rate Last Dose  . lactated ringers infusion   Intravenous Continuous Mauri Pole, MD        Allergies as of 03/28/2018 - Review Complete 03/28/2018  Allergen Reaction Noted  . Etodolac Shortness Of Breath and Anaphylaxis 11/08/2007  . Penicillins Swelling and Anaphylaxis 02/06/2015  . Amoxicillin  09/22/2005  . Ampicillin Swelling 02/13/2017  . Augmentin [amoxicillin-pot clavulanate]  02/06/2015  . Diclofenac sodium  09/22/2005  . Iodine  09/22/2005  . Latex  06/29/2015  . Omeprazole  09/22/2005  . Other  06/29/2015  . Pantoprazole sodium  09/22/2005    Family History  Problem Relation Age of Onset  . Diabetes Mother   . Diabetes Paternal Uncle     Social History   Socioeconomic History  . Marital status: Divorced    Spouse name: Not on file  . Number of children: Not on file  . Years of education: Not on file  . Highest education level: Not on file  Occupational History   . Not on file  Social Needs  . Financial resource strain: Not on file  . Food insecurity:    Worry: Not on file    Inability: Not on file  . Transportation needs:    Medical: Not on file    Non-medical: Not on file  Tobacco Use  . Smoking status: Never Smoker  . Smokeless tobacco: Never Used  Substance and Sexual Activity  . Alcohol use: No  . Drug use: No  . Sexual activity: Not Currently  Lifestyle  . Physical activity:    Days per week: Not on file    Minutes per session: Not on file  . Stress: Not on file  Relationships  . Social connections:    Talks on phone: Not on file    Gets together: Not on file    Attends religious service: Not on file    Active member of club or organization: Not on file    Attends meetings of clubs or organizations: Not on file    Relationship status: Not on file  . Intimate partner violence:    Fear of current or ex partner: Not on file    Emotionally abused: Not on file    Physically abused: Not on file    Forced sexual activity: Not on file  Other Topics Concern  . Not on file  Social History Narrative  . Not on file    Review of Systems:  All other review of systems negative except as mentioned in the HPI.  Physical Exam: Vital signs in last 24 hours: Temp:  [98.3 F (36.8 C)] 98.3 F (36.8 C) (12/23 0950) Pulse Rate:  [64] 64 (12/23 0950) Resp:  [16] 16 (12/23 0950) BP: (141)/(61) 141/61 (12/23 0950) SpO2:  [94 %] 94 % (12/23 0950) Weight:  [190.5 kg] 190.5 kg (12/23 0950)   General:   Alert, morbidly obese, pleasant and cooperative in NAD Lungs:  Clear throughout to auscultation.   Heart:  Regular rate and rhythm; no murmurs, clicks, rubs,  or gallops. Abdomen:  Soft, nontender and nondistended. Normal bowel sounds.   Neuro/Psych:  Alert and cooperative. Normal mood and affect. A and O x 3   K. Denzil Magnuson , MD  [phone]     

## 2018-05-30 ENCOUNTER — Encounter: Payer: Self-pay | Admitting: Family Medicine

## 2018-06-03 ENCOUNTER — Ambulatory Visit: Payer: Self-pay

## 2018-06-04 ENCOUNTER — Other Ambulatory Visit: Payer: Self-pay

## 2018-06-04 ENCOUNTER — Ambulatory Visit: Payer: Medicaid Other | Admitting: Allergy and Immunology

## 2018-06-04 ENCOUNTER — Encounter: Payer: Self-pay | Admitting: Allergy and Immunology

## 2018-06-04 VITALS — BP 118/74 | HR 83 | Resp 14 | Ht 64.5 in | Wt >= 6400 oz

## 2018-06-04 DIAGNOSIS — B370 Candidal stomatitis: Secondary | ICD-10-CM

## 2018-06-04 DIAGNOSIS — J3089 Other allergic rhinitis: Secondary | ICD-10-CM

## 2018-06-04 DIAGNOSIS — K219 Gastro-esophageal reflux disease without esophagitis: Secondary | ICD-10-CM

## 2018-06-04 DIAGNOSIS — J4551 Severe persistent asthma with (acute) exacerbation: Secondary | ICD-10-CM | POA: Diagnosis not present

## 2018-06-04 DIAGNOSIS — I5030 Unspecified diastolic (congestive) heart failure: Secondary | ICD-10-CM

## 2018-06-04 MED ORDER — ALBUTEROL SULFATE HFA 108 (90 BASE) MCG/ACT IN AERS: INHALATION_SPRAY | RESPIRATORY_TRACT | 3 refills | Status: DC

## 2018-06-04 MED ORDER — OMEPRAZOLE 40 MG PO CPDR: 40.0000 mg | DELAYED_RELEASE_CAPSULE | Freq: Every day | ORAL | 5 refills | Status: DC

## 2018-06-04 MED ORDER — IPRATROPIUM-ALBUTEROL 0.5-2.5 (3) MG/3ML IN SOLN: 3.0000 mL | Freq: Four times a day (QID) | RESPIRATORY_TRACT | 0 refills | Status: DC | PRN

## 2018-06-04 MED ORDER — FUROSEMIDE 40 MG PO TABS: ORAL_TABLET | ORAL | 3 refills | Status: DC

## 2018-06-04 MED ORDER — FAMOTIDINE 20 MG PO TABS: 20.0000 mg | ORAL_TABLET | Freq: Every day | ORAL | 3 refills | Status: DC

## 2018-06-04 MED ORDER — PREDNISONE 10 MG PO TABS: 10.0000 mg | ORAL_TABLET | Freq: Two times a day (BID) | ORAL | 0 refills | Status: DC

## 2018-06-04 NOTE — Telephone Encounter (Signed)
Patient called stating the pharmacy only had 2 prescriptions, patient thought it was supposed to be 3. Please call patient

## 2018-06-04 NOTE — Progress Notes (Signed)
Follow-up Note  Referring Provider: [name], MD Primary Provider: [name], MD Date of Office Visit: 06/04/2018  Subjective:   Kelsey Bullock (DOB: 1961/04/06) is a 57 y.o. female who returns to the [facility] on 06/04/2018 in re-evaluation of the following:  HPI: Kelsey Bullock returns to this clinic in reevaluation of asthma and allergic rhinitis and reflux and untreated sleep apnea.  Her last visit to this clinic was 19 March 2018.  She was doing quite well with her asthma and has not really required a acute treatment for any airway issue including the use of systemic steroids or antibiotics and rarely used her short acting bronchodilator while maintaining a treatment plan that included the administration of omalizumab.  As well, she thought that her reflux was under excellent control.  Unfortunately, last week she had contact with an individual who had a respiratory tract illness and she contracted this issue 1 week ago.  She had sneezing and just felt bad in general which progressed to involve a very bad sore throat and nasal congestion and coughing and wheezing and shortness of breath and extensive use of her short acting bronchodilator.  She is actually a little bit better today compared to yesterday.  Her nose is better and her breathing is better.  She never had any high fever or ugly nasal discharge or ugly sputum production or chest pain with these episodes.  She did restart her Qvar in addition to her daily Dulera use when she became sick.  Allergies as of 06/04/2018      Reactions   Etodolac Shortness Of Breath, Anaphylaxis   REACTION: lips swelled   Penicillins Anaphylaxis, Swelling   DID THE REACTION INVOLVE: Swelling of the face/tongue/throat, SOB, or low BP? Yes Sudden or severe rash/hives, skin peeling, or the inside of the mouth or nose? No Did it require medical treatment? No When did it last happen?20 years ago If all above  answers are "NO", may proceed with cephalosporin use.   Amoxicillin    REACTION: unspecified   Ampicillin Swelling   Augmentin [amoxicillin-pot Clavulanate]    Diclofenac Sodium    REACTION: unspecified   Iodine    REACTION: unspecified   Pantoprazole Sodium    REACTION: unspecified   Adhesive [tape] Rash      Medication List      acetaminophen 500 MG tablet Commonly known as:  TYLENOL Take 1,000 mg by mouth every 6 (six) hours as needed for moderate pain or headache.   albuterol 108 (90 Base) MCG/ACT inhaler Commonly known as:  PROAIR HFA INHALE 2 PUFFS INTO THE LUNGS EVERY 4 HOURS AS NEEDED FOR WHEEZING   albuterol (2.5 MG/3ML) 0.083% nebulizer solution Commonly known as:  PROVENTIL USE 1 VIAL VIA NEBULIZER EVERY 4 TO 6 HOURS AS NEEDED FOR COUGH OR WHEEZING   aspirin 81 MG EC tablet Take 1 tablet (81 mg total) by mouth daily. Swallow whole.   atorvastatin 40 MG tablet Commonly known as:  LIPITOR Take 1 tablet (40 mg total) by mouth daily.   beclomethasone 80 MCG/ACT inhaler Commonly known as:  QVAR INHALE 2 PUFFS BY MOUTH INTO THE LUNGS TWICE DAILY   calcium carbonate 500 MG chewable tablet Commonly known as:  TUMS - dosed in mg elemental calcium Chew 2-3 tablets by mouth daily as needed for indigestion or heartburn.   cetirizine 10 MG tablet Commonly known as:  ZYRTEC TAKE 1 TABLET BY MOUTH EVERY DAY FOR RUNNY NOSE  OR ITCHING   CINNAMON PO Take 1 tablet by mouth daily as needed (for high bp).   clonazePAM 1 MG tablet Commonly known as:  KLONOPIN Take 1 mg by mouth at bedtime.   cyclobenzaprine 10 MG tablet Commonly known as:  FLEXERIL TAKE 1 TABLET BY MOUTH AT BEDTIME AS NEEDED FOR BACK OR NECK PAIN OR SPASMS   Echinacea 125 MG Caps Take 125 mg by mouth daily as needed (immune support).   EPINEPHrine 0.3 mg/0.3 mL Soaj injection Commonly known as:  EPIPEN 2-PAK Inject 0.3 mLs (0.3 mg total) into the muscle once for 1 dose.   erythromycin ophthalmic  ointment Place 1 application into both eyes at bedtime.   famotidine 20 MG tablet Commonly known as:  PEPCID Take 1 tablet (20 mg total) by mouth daily.   fluconazole 100 MG tablet Commonly known as:  DIFLUCAN Take 100 mg by mouth daily as needed (yeast infections).   fluticasone 50 MCG/ACT nasal spray Commonly known as:  FLONASE USE 2 SPRAYS IN EACH NOSTRIL ONCE DIALY FOR STUFFY NOSE OR DRAINAGE.   furosemide 40 MG tablet Commonly known as:  LASIX TAKE 1 TABLET(40 MG) BY MOUTH TWICE DAILY   ipratropium 0.06 % nasal spray Commonly known as:  ATROVENT USE 2 SPRAYS IN EACH NOSTRIL EVERY 6 HOURS IF NEEDED TO DRY UP NOSE   lisinopril 40 MG tablet Commonly known as:  PRINIVIL,ZESTRIL TAKE 1 TABLET BY MOUTH EVERY DAY   Magnesium 250 MG Tabs Take 250 mg by mouth daily.   mirtazapine 45 MG tablet Commonly known as:  REMERON Take 45 mg by mouth at bedtime.   mometasone-formoterol 200-5 MCG/ACT Aero Commonly known as:  DULERA Inhale 2 puffs into the lungs 2 (two) times daily.   montelukast 10 MG tablet Commonly known as:  SINGULAIR TAKE 1 TABLET(10 MG) BY MOUTH AT BEDTIME   norethindrone 5 MG tablet Commonly known as:  AYGESTIN Take 2.5 mg by mouth at bedtime.   nystatin 100000 UNIT/ML suspension Commonly known as:  MYCOSTATIN TAKE 5 MILLILITERS BY MOUTH 4 TIMES DAILY   nystatin powder Commonly known as:  nystatin APPLY TOPICALLY THREE TIMES DAILY   omalizumab 150 MG injection Commonly known as:  XOLAIR Inject 300 mg into the skin every 28 (twenty-eight) days.   PATADAY 0.2 % Soln Generic drug:  Olopatadine HCl INT 1 GTT IN OU D   terconazole 0.4 % vaginal cream Commonly known as:  TERAZOL 7 Place 1 applicator vaginally at bedtime.   triamcinolone 0.025 % ointment Commonly known as:  KENALOG Apply 1 application topically 2 (two) times daily.   vitamin C 100 MG tablet Take 300 mg by mouth daily as needed (immune support).       Past Medical History:    Diagnosis Date  . Allergic rhinitis   . Asthma   . Complication of anesthesia   . Congestive heart failure (CHF) ([place])   . Depression   . Gall stone   . GERD (gastroesophageal reflux disease)   . PONV (postoperative nausea and vomiting)     Past Surgical History:  Procedure Laterality Date  . ADENOIDECTOMY    . BIOPSY  05/27/2018   Procedure: BIOPSY;  Surgeon: Mauri Pole, MD;  Location: [facility];  Service: Endoscopy;;  . CESAREAN SECTION    . COLONOSCOPY WITH PROPOFOL N/A 05/27/2018   Procedure: COLONOSCOPY WITH PROPOFOL;  Surgeon: Mauri Pole, MD;  Location: [facility];  Service: Endoscopy;  Laterality: N/A;  . CYST EXCISION    .  OVARIAN CYST REMOVAL    . POLYPECTOMY  05/27/2018   Procedure: POLYPECTOMY;  Surgeon: Mauri Pole, MD;  Location: [facility];  Service: Endoscopy;;  . removal of gallstones    . TONSILLECTOMY    . TYMPANOSTOMY TUBE PLACEMENT      Review of systems negative except as noted in HPI / PMHx or noted below:  Review of Systems  Constitutional: Negative.   HENT: Negative.   Eyes: Negative.   Respiratory: Negative.   Cardiovascular: Negative.   Gastrointestinal: Negative.   Genitourinary: Negative.   Musculoskeletal: Negative.   Skin: Negative.   Neurological: Negative.   Endo/Heme/Allergies: Negative.   Psychiatric/Behavioral: Negative.      Objective:   Vitals:   06/04/18 0944  BP: 118/74  Pulse: 83  Resp: 14  SpO2: 95%   Height: 5' 4.5" (163.8 cm)  Weight: (!) 411 lb (186.4 kg)   Physical Exam Constitutional:      Appearance: She is not diaphoretic.     Comments: Coughing  HENT:     Head: Normocephalic.     Right Ear: Tympanic membrane, ear canal and external ear normal.     Left Ear: Tympanic membrane, ear canal and external ear normal.     Nose: Mucosal edema (Erythematous) present. No rhinorrhea.     Mouth/Throat:     Pharynx: Uvula midline. Posterior oropharyngeal erythema present. No  oropharyngeal exudate.  Eyes:     Conjunctiva/sclera: Conjunctivae normal.  Neck:     Thyroid: No thyromegaly.     Trachea: Trachea normal. No tracheal tenderness or tracheal deviation.  Cardiovascular:     Rate and Rhythm: Normal rate and regular rhythm.     Heart sounds: Normal heart sounds, S1 normal and S2 normal. No murmur.  Pulmonary:     Effort: No respiratory distress.     Breath sounds: No stridor. Wheezing (Bilateral expiratory wheezing all lung fields) present. No rales.  Lymphadenopathy:     Head:     Right side of head: No tonsillar adenopathy.     Left side of head: No tonsillar adenopathy.     Cervical: No cervical adenopathy.  Skin:    Findings: No erythema or rash.     Nails: There is no clubbing.   Neurological:     Mental Status: She is alert.     Diagnostics:    Spirometry was performed and demonstrated an FEV1 of 1.54 at 57 % of predicted.  The patient had an Asthma Control Test with the following results: ACT Total Score: 6.    Assessment and Plan:   1. Asthma, not well controlled, severe persistent, with acute exacerbation   2. Other allergic rhinitis   3. LPRD (laryngopharyngeal reflux disease)   4. Diastolic CHF with preserved left ventricular function, NYHA class 2 ([place])      1. Continue Dulera 200 2 inhalations twice a day   2. Continue montelukast 10 mg daily  3. Continue Flonase one or 2 sprays each nostril daily  4. Continue Xolair and EpiPen  5. Continue Omeprazole 40 mg in AM plus Famotidine 20 mg in PM  6. Add Qvar 80 Redihaler 2 inhalations twice a day to [facility] while "sick"  7.  Use pro-air HFA and antihistamine and triamcinolone 0.025% ointment if needed  8.  For this most recent episode utilize the following:   A.  DuoNeb nebulized today and every 6 hours if needed  B.  Prednisone 40 mg administered in clinic today  C.  Prednisone 20 mg once a day for 10 days only  9. Return to clinic in 12 weeks or earlier if  problem  It appears that Ilhan has contracted a viral respiratory tract infection and this has resulted in significant inflammation of her airway.  We will treat her with the therapy noted above which does include systemic steroids and she will maintain a collection of medical therapy that includes both omalizumab and anti-inflammatory agents delivered to her airway on a consistent basis.  She will also continue to treat reflux as noted above.  Assuming she does well I will see her back in this clinic in 12 weeks or earlier if there is a problem.  Allena Katz, MD Allergy / Immunology [place]

## 2018-06-04 NOTE — Patient Instructions (Addendum)
  1. Continue Dulera 200 2 inhalations twice a day   2. Continue montelukast 10 mg daily  3. Continue Flonase one or 2 sprays each nostril daily  4. Continue Xolair and EpiPen  5. Continue Omeprazole 40 mg in AM plus Famotidine 20 mg in PM  6. Add Qvar 80 Redihaler 2 inhalations twice a day to [facility] while "sick"  7.  Use pro-air HFA and antihistamine and triamcinolone 0.025% ointment if needed  8.  For this most recent episode utilize the following:   A.  DuoNeb nebulized today and every 6 hours if needed  B.  Prednisone 40 mg administered in clinic today  C.  Prednisone 20 mg once a day for 10 days only  9. Return to clinic in 12 weeks or earlier if problem

## 2018-06-06 ENCOUNTER — Encounter: Payer: Self-pay | Admitting: Allergy and Immunology

## 2018-06-06 ENCOUNTER — Telehealth: Payer: Self-pay

## 2018-06-06 ENCOUNTER — Other Ambulatory Visit: Payer: Self-pay

## 2018-06-06 DIAGNOSIS — B370 Candidal stomatitis: Secondary | ICD-10-CM

## 2018-06-06 MED ORDER — NYSTATIN 100000 UNIT/ML MT SUSP: 5.0000 mL | Freq: Four times a day (QID) | OROMUCOSAL | 0 refills | Status: DC | PRN

## 2018-06-06 NOTE — Progress Notes (Signed)
Per Charlena Cross, Dr. Neldon Mc verbalize Kelsey Bullock could get a refill on her Nystatin medication. Sent in Nystatin to patient pharmacy. Dr. Neldon Mc please advise if this is what you wanted on 06/04/2018 when patient was seen in the office.

## 2018-06-06 NOTE — Progress Notes (Signed)
This the correct script.

## 2018-06-06 NOTE — Telephone Encounter (Signed)
Patient called requesting an update on her Nystatin rx.  Informed that we would send this in to the [facility] on [address].

## 2018-06-07 ENCOUNTER — Other Ambulatory Visit: Payer: Self-pay | Admitting: Allergy and Immunology

## 2018-06-10 ENCOUNTER — Encounter: Payer: Self-pay | Admitting: Gastroenterology

## 2018-06-13 ENCOUNTER — Other Ambulatory Visit: Payer: Self-pay | Admitting: Family Medicine

## 2018-06-14 ENCOUNTER — Ambulatory Visit (INDEPENDENT_AMBULATORY_CARE_PROVIDER_SITE_OTHER): Payer: Medicaid Other | Admitting: *Deleted

## 2018-06-14 DIAGNOSIS — J4540 Moderate persistent asthma, uncomplicated: Secondary | ICD-10-CM

## 2018-06-14 NOTE — Telephone Encounter (Signed)
Done

## 2018-06-18 ENCOUNTER — Ambulatory Visit: Payer: Medicaid Other | Admitting: Allergy and Immunology

## 2018-06-19 ENCOUNTER — Other Ambulatory Visit: Payer: Self-pay | Admitting: Family Medicine

## 2018-06-19 DIAGNOSIS — L219 Seborrheic dermatitis, unspecified: Secondary | ICD-10-CM

## 2018-07-12 ENCOUNTER — Ambulatory Visit (INDEPENDENT_AMBULATORY_CARE_PROVIDER_SITE_OTHER): Payer: Medicaid Other

## 2018-07-12 DIAGNOSIS — J4540 Moderate persistent asthma, uncomplicated: Secondary | ICD-10-CM | POA: Diagnosis not present

## 2018-07-25 ENCOUNTER — Other Ambulatory Visit: Payer: Self-pay | Admitting: Allergy and Immunology

## 2018-08-09 ENCOUNTER — Other Ambulatory Visit: Payer: Self-pay | Admitting: Allergy and Immunology

## 2018-08-09 ENCOUNTER — Ambulatory Visit (INDEPENDENT_AMBULATORY_CARE_PROVIDER_SITE_OTHER): Payer: Medicaid Other | Admitting: *Deleted

## 2018-08-09 DIAGNOSIS — J4540 Moderate persistent asthma, uncomplicated: Secondary | ICD-10-CM | POA: Diagnosis not present

## 2018-08-16 ENCOUNTER — Other Ambulatory Visit: Payer: Self-pay

## 2018-08-16 ENCOUNTER — Ambulatory Visit: Payer: Medicaid Other | Admitting: Family Medicine

## 2018-08-16 VITALS — BP 118/65 | HR 81 | Temp 97.8°F | Wt >= 6400 oz

## 2018-08-16 DIAGNOSIS — L03032 Cellulitis of left toe: Secondary | ICD-10-CM | POA: Diagnosis not present

## 2018-08-16 MED ORDER — DOXYCYCLINE HYCLATE 100 MG PO TABS: 100.0000 mg | ORAL_TABLET | Freq: Two times a day (BID) | ORAL | 0 refills | Status: DC

## 2018-08-16 MED ORDER — SULFAMETHOXAZOLE-TRIMETHOPRIM 800-160 MG PO TABS: 1.0000 | ORAL_TABLET | Freq: Two times a day (BID) | ORAL | 0 refills | Status: AC

## 2018-08-16 NOTE — Assessment & Plan Note (Signed)
Patient presents with left great toe redness and pain after recent ingrown nail.  Pus was expressed yesterday with continued pain and erythema.  No systemic symptoms concerning for bacteremia. Patient is diabetic and has has previous presentation consistent with paronychia. Given pus expressed at the doctor's office concern for MRSA. Low suspicion for pseudomonas infection. Will prescribe doxycycline 100 mg bid for 10 days. Patient will follow up in clinic if she does not improve with regimen.

## 2018-08-16 NOTE — Progress Notes (Signed)
Subjective:    Patient ID: Kelsey Bullock, female    DOB: 1960-09-02, 58 y.o.   MRN: [id number]   CC: Toe pain and redness  HPI: Patient is a 57 year old female who presents today complaining of left big toe pain and redness.  Patient reports that she had an ingrown toenail on her back during her left foot which got increasingly more swollen and painful over the past few days.  Yesterday she went to nail found to have it clipped and disposition was able to express a lot of pus out of an abscess.  She reports that redness was all the way down to the knuckle.  Overnight patient soaked her feet in water with Epson salt and report that this morning redness had improved.  She is here today because she is diabetic and knows that foot infection sometimes need to be looked at to prevent further infection.  Patient had similar presentation a few months ago and was treated with an antibiotic course with complete resolution.  Patient denies any fever, chills nausea, vomiting.  Smoking status reviewed   ROS: all other systems were reviewed and are negative other than in the HPI   Past Medical History:  Diagnosis Date  . Allergic rhinitis   . Asthma   . Complication of anesthesia   . Congestive heart failure (CHF) ([place])   . Depression   . Gall stone   . GERD (gastroesophageal reflux disease)   . PONV (postoperative nausea and vomiting)     Past Surgical History:  Procedure Laterality Date  . ADENOIDECTOMY    . BIOPSY  05/27/2018   Procedure: BIOPSY;  Surgeon: Mauri Pole, MD;  Location: [facility];  Service: Endoscopy;;  . CESAREAN SECTION    . COLONOSCOPY WITH PROPOFOL N/A 05/27/2018   Procedure: COLONOSCOPY WITH PROPOFOL;  Surgeon: Mauri Pole, MD;  Location: [facility];  Service: Endoscopy;  Laterality: N/A;  . CYST EXCISION    . OVARIAN CYST REMOVAL    . POLYPECTOMY  05/27/2018   Procedure: POLYPECTOMY;  Surgeon: Mauri Pole, MD;  Location: [facility];   Service: Endoscopy;;  . removal of gallstones    . TONSILLECTOMY    . TYMPANOSTOMY TUBE PLACEMENT      Past medical history, surgical, family, and social history reviewed and updated in the EMR as appropriate.  Objective:  BP 118/65   Pulse 81   Temp 97.8 F (36.6 C) (Oral)   Wt (!) 414 lb (187.8 kg)   SpO2 97%   BMI 69.97 kg/m   Vitals and nursing note reviewed      General: NAD, pleasant, able to participate in exam Cardiac: RRR, normal heart sounds, no murmurs. 2+ radial and PT pulses bilaterally Respiratory: CTAB, normal effort, No wheezes, rales or rhonchi Abdomen: soft, nontender, nondistended, no hepatic or splenomegaly, +BS Extremities: Left foot great toe erythematous all the way to the proximal phalanx tender to palpation.  No appreciable fluctuance or abscess noted on exam. Skin: warm and dry, no rashes noted Neuro: alert and oriented x4, no focal deficits Psych: Normal affect and mood   Assessment & Plan:   Paronychia of great toe of left foot Patient presents with left great toe redness and pain after recent ingrown nail.  Pus was expressed yesterday with continued pain and erythema.  No systemic symptoms concerning for bacteremia. Patient is diabetic and has has previous presentation consistent with paronychia. Given pus expressed at the doctor's office concern for MRSA. Low  suspicion for pseudomonas infection. Will prescribe doxycycline 100 mg bid for 10 days. Patient will follow up in clinic if she does not improve with regimen.     Marjie Skiff, MD [place] PGY-3

## 2018-08-16 NOTE — Addendum Note (Signed)
Addended by: Marjie Skiff F on: 08/16/2018 04:48 PM   Modules accepted: Orders

## 2018-08-16 NOTE — Patient Instructions (Signed)
Paronychia  Paronychia is an infection of the skin that surrounds a nail. It usually affects the skin around a fingernail, but it may also occur near a toenail. It often causes pain and swelling around the nail. In some cases, a collection of pus (abscess) can form near or under the nail.   This condition may develop suddenly, or it may develop gradually over a longer period. In most cases, paronychia is not serious, and it will clear up with treatment.  What are the causes?  This condition may be caused by bacteria or a fungus. These germs can enter the body through an opening in the skin, such as a cut or a hangnail.  What increases the risk?  This condition is more likely to develop in people who:   Get their hands wet often, such as those who work as dishwashers, bartenders, or nurses.   Bite their fingernails or suck their thumbs.   Trim their nails very short.   Have hangnails or injured fingertips.   Get manicures.   Have diabetes.  What are the signs or symptoms?  Symptoms of this condition include:   Redness and swelling of the skin near the nail.   Tenderness around the nail when you touch the area.   Pus-filled bumps under the skin at the base and sides of the nail (cuticle).   Fluid or pus under the nail.   Throbbing pain in the area.  How is this diagnosed?  This condition is diagnosed with a physical exam. In some cases, a sample of pus may be tested to determine what type of bacteria or fungus is causing the condition.  How is this treated?  Treatment depends on the cause and severity of your condition. If your condition is mild, it may clear up on its own in a few days or after soaking in warm water. If needed, treatment may include:   Antibiotic medicine, if your infection is caused by bacteria.   Antifungal medicine, if your infection is caused by a fungus.   A procedure to drain pus from an abscess.   Anti-inflammatory medicine (corticosteroids).  Follow these instructions at  home:  Wound care   Keep the affected area clean.   Soak the affected area in warm water, if told to do so by your health care provider. You may be told to do this for 20 minutes, 2-3 times a day.   Keep the area dry when you are not soaking it.   Do not try to drain an abscess yourself.   Follow instructions from your health care provider about how to take care of the affected area. Make sure you:  ? Wash your hands with soap and water before you change your bandage (dressing). If soap and water are not available, use hand sanitizer.  ? Change your dressing as told by your health care provider.   If you had an abscess drained, check the area every day for signs of infection. Check for:  ? Redness, swelling, or pain.  ? Fluid or blood.  ? Warmth.  ? Pus or a bad smell.  Medicines     Take over-the-counter and prescription medicines only as told by your health care provider.   If you were prescribed an antibiotic medicine, take it as told by your health care provider. Do not stop taking the antibiotic even if you start to feel better.  General instructions   Avoid contact with harsh chemicals.   Do not pick   at the affected area.  Prevention   To prevent this condition from happening again:  ? Wear rubber gloves when washing dishes or doing other tasks that require your hands to get wet.  ? Wear gloves if your hands might come in contact with cleaners or other chemicals.  ? Avoid injuring your nails or fingertips.  ? Do not bite your nails or tear hangnails.  ? Do not cut your nails very short.  ? Do not cut your cuticles.  ? Use clean nail clippers or scissors when trimming nails.  Contact a health care provider if:   Your symptoms get worse or do not improve with treatment.   You have continued or increased fluid, blood, or pus coming from the affected area.   Your finger or knuckle becomes swollen or difficult to move.  Get help right away if you have:   A fever or chills.   Redness spreading away  from the affected area.   Joint or muscle pain.  Summary   Paronychia is an infection of the skin that surrounds a nail. It often causes pain and swelling around the nail. In some cases, a collection of pus (abscess) can form near or under the nail.   This condition may be caused by bacteria or a fungus. These germs can enter the body through an opening in the skin, such as a cut or a hangnail.   If your condition is mild, it may clear up on its own in a few days. If needed, treatment may include medicine or a procedure to drain pus from an abscess.   To prevent this condition from happening again, wear gloves if doing tasks that require your hands to get wet or to come in contact with chemicals. Also avoid injuring your nails or fingertips.  This information is not intended to replace advice given to you by your health care provider. Make sure you discuss any questions you have with your health care provider.  Document Released: 11/15/2000 Document Revised: 06/04/2017 Document Reviewed: 06/04/2017  Elsevier Interactive Patient Education  2019 [employer]

## 2018-09-06 ENCOUNTER — Other Ambulatory Visit: Payer: Self-pay | Admitting: Allergy and Immunology

## 2018-09-06 ENCOUNTER — Other Ambulatory Visit: Payer: Self-pay | Admitting: Family Medicine

## 2018-09-06 ENCOUNTER — Ambulatory Visit (INDEPENDENT_AMBULATORY_CARE_PROVIDER_SITE_OTHER): Payer: Medicaid Other | Admitting: *Deleted

## 2018-09-06 ENCOUNTER — Other Ambulatory Visit: Payer: Self-pay | Admitting: Allergy

## 2018-09-06 DIAGNOSIS — I10 Essential (primary) hypertension: Secondary | ICD-10-CM

## 2018-09-06 DIAGNOSIS — J4540 Moderate persistent asthma, uncomplicated: Secondary | ICD-10-CM | POA: Diagnosis not present

## 2018-09-06 DIAGNOSIS — L304 Erythema intertrigo: Secondary | ICD-10-CM

## 2018-09-10 DIAGNOSIS — F4310 Post-traumatic stress disorder, unspecified: Secondary | ICD-10-CM | POA: Diagnosis not present

## 2018-09-10 DIAGNOSIS — F329 Major depressive disorder, single episode, unspecified: Secondary | ICD-10-CM | POA: Diagnosis not present

## 2018-09-10 DIAGNOSIS — F4329 Adjustment disorder with other symptoms: Secondary | ICD-10-CM | POA: Diagnosis not present

## 2018-09-10 DIAGNOSIS — F429 Obsessive-compulsive disorder, unspecified: Secondary | ICD-10-CM | POA: Diagnosis not present

## 2018-09-18 ENCOUNTER — Other Ambulatory Visit: Payer: Self-pay | Admitting: Family Medicine

## 2018-09-18 DIAGNOSIS — Z1231 Encounter for screening mammogram for malignant neoplasm of breast: Secondary | ICD-10-CM

## 2018-10-04 ENCOUNTER — Other Ambulatory Visit: Payer: Self-pay

## 2018-10-04 ENCOUNTER — Ambulatory Visit: Payer: Self-pay | Admitting: *Deleted

## 2018-10-04 ENCOUNTER — Ambulatory Visit (INDEPENDENT_AMBULATORY_CARE_PROVIDER_SITE_OTHER): Payer: Medicaid Other | Admitting: *Deleted

## 2018-10-04 DIAGNOSIS — J4540 Moderate persistent asthma, uncomplicated: Secondary | ICD-10-CM | POA: Diagnosis not present

## 2018-10-23 DIAGNOSIS — H40033 Anatomical narrow angle, bilateral: Secondary | ICD-10-CM | POA: Diagnosis not present

## 2018-10-23 DIAGNOSIS — H0012 Chalazion right lower eyelid: Secondary | ICD-10-CM | POA: Diagnosis not present

## 2018-10-23 DIAGNOSIS — L719 Rosacea, unspecified: Secondary | ICD-10-CM | POA: Diagnosis not present

## 2018-10-23 DIAGNOSIS — H47323 Drusen of optic disc, bilateral: Secondary | ICD-10-CM | POA: Diagnosis not present

## 2018-11-01 ENCOUNTER — Other Ambulatory Visit: Payer: Self-pay

## 2018-11-01 ENCOUNTER — Ambulatory Visit: Payer: Self-pay | Admitting: *Deleted

## 2018-11-01 ENCOUNTER — Other Ambulatory Visit: Payer: Self-pay | Admitting: Allergy and Immunology

## 2018-11-01 DIAGNOSIS — J4540 Moderate persistent asthma, uncomplicated: Secondary | ICD-10-CM

## 2018-11-14 DIAGNOSIS — Z01419 Encounter for gynecological examination (general) (routine) without abnormal findings: Secondary | ICD-10-CM | POA: Diagnosis not present

## 2018-11-14 DIAGNOSIS — Z0000 Encounter for general adult medical examination without abnormal findings: Secondary | ICD-10-CM | POA: Diagnosis not present

## 2018-11-22 ENCOUNTER — Ambulatory Visit
Admission: RE | Admit: 2018-11-22 | Discharge: 2018-11-22 | Disposition: A | Discharge status: Home / Self Care | Payer: Medicaid Other | Source: Ambulatory Visit | Attending: Family Medicine | Admitting: Family Medicine

## 2018-11-22 ENCOUNTER — Other Ambulatory Visit: Payer: Self-pay

## 2018-11-22 DIAGNOSIS — Z1231 Encounter for screening mammogram for malignant neoplasm of breast: Secondary | ICD-10-CM | POA: Diagnosis not present

## 2018-11-28 DIAGNOSIS — N926 Irregular menstruation, unspecified: Secondary | ICD-10-CM | POA: Diagnosis not present

## 2018-11-29 ENCOUNTER — Ambulatory Visit: Payer: Self-pay | Admitting: *Deleted

## 2018-11-29 ENCOUNTER — Other Ambulatory Visit: Payer: Self-pay

## 2018-11-29 DIAGNOSIS — J4540 Moderate persistent asthma, uncomplicated: Secondary | ICD-10-CM

## 2018-12-12 DIAGNOSIS — F429 Obsessive-compulsive disorder, unspecified: Secondary | ICD-10-CM | POA: Diagnosis not present

## 2018-12-12 DIAGNOSIS — F4310 Post-traumatic stress disorder, unspecified: Secondary | ICD-10-CM | POA: Diagnosis not present

## 2018-12-12 DIAGNOSIS — F329 Major depressive disorder, single episode, unspecified: Secondary | ICD-10-CM | POA: Diagnosis not present

## 2018-12-27 ENCOUNTER — Other Ambulatory Visit: Payer: Self-pay

## 2018-12-27 ENCOUNTER — Ambulatory Visit (INDEPENDENT_AMBULATORY_CARE_PROVIDER_SITE_OTHER): Payer: Medicaid Other | Admitting: *Deleted

## 2018-12-27 DIAGNOSIS — J4540 Moderate persistent asthma, uncomplicated: Secondary | ICD-10-CM | POA: Diagnosis not present

## 2019-01-24 ENCOUNTER — Other Ambulatory Visit: Payer: Self-pay

## 2019-01-24 ENCOUNTER — Ambulatory Visit (INDEPENDENT_AMBULATORY_CARE_PROVIDER_SITE_OTHER): Payer: Medicaid Other

## 2019-01-24 DIAGNOSIS — J4540 Moderate persistent asthma, uncomplicated: Secondary | ICD-10-CM

## 2019-02-02 ENCOUNTER — Inpatient Hospital Stay (HOSPITAL_COMMUNITY)
Admission: EM | Admit: 2019-02-02 | Discharge: 2019-02-11 | DRG: 329 | Disposition: A | Discharge status: Home Health | Payer: Medicaid Other | Attending: Family Medicine | Admitting: Family Medicine

## 2019-02-02 ENCOUNTER — Emergency Department (HOSPITAL_COMMUNITY): Payer: Medicaid Other

## 2019-02-02 ENCOUNTER — Encounter (HOSPITAL_COMMUNITY): Payer: Self-pay | Admitting: *Deleted

## 2019-02-02 ENCOUNTER — Other Ambulatory Visit: Payer: Self-pay

## 2019-02-02 DIAGNOSIS — K420 Umbilical hernia with obstruction, without gangrene: Secondary | ICD-10-CM | POA: Diagnosis present

## 2019-02-02 DIAGNOSIS — Z6841 Body Mass Index (BMI) 40.0 and over, adult: Secondary | ICD-10-CM | POA: Diagnosis not present

## 2019-02-02 DIAGNOSIS — R5082 Postprocedural fever: Secondary | ICD-10-CM | POA: Diagnosis not present

## 2019-02-02 DIAGNOSIS — I5032 Chronic diastolic (congestive) heart failure: Secondary | ICD-10-CM | POA: Diagnosis not present

## 2019-02-02 DIAGNOSIS — Z4682 Encounter for fitting and adjustment of non-vascular catheter: Secondary | ICD-10-CM | POA: Diagnosis not present

## 2019-02-02 DIAGNOSIS — Z20828 Contact with and (suspected) exposure to other viral communicable diseases: Secondary | ICD-10-CM | POA: Diagnosis not present

## 2019-02-02 DIAGNOSIS — J4550 Severe persistent asthma, uncomplicated: Secondary | ICD-10-CM | POA: Diagnosis present

## 2019-02-02 DIAGNOSIS — F329 Major depressive disorder, single episode, unspecified: Secondary | ICD-10-CM | POA: Diagnosis present

## 2019-02-02 DIAGNOSIS — J4540 Moderate persistent asthma, uncomplicated: Secondary | ICD-10-CM | POA: Diagnosis not present

## 2019-02-02 DIAGNOSIS — R509 Fever, unspecified: Secondary | ICD-10-CM

## 2019-02-02 DIAGNOSIS — M797 Fibromyalgia: Secondary | ICD-10-CM | POA: Diagnosis present

## 2019-02-02 DIAGNOSIS — I5030 Unspecified diastolic (congestive) heart failure: Secondary | ICD-10-CM | POA: Diagnosis not present

## 2019-02-02 DIAGNOSIS — R42 Dizziness and giddiness: Secondary | ICD-10-CM | POA: Diagnosis not present

## 2019-02-02 DIAGNOSIS — Z79899 Other long term (current) drug therapy: Secondary | ICD-10-CM

## 2019-02-02 DIAGNOSIS — R809 Proteinuria, unspecified: Secondary | ICD-10-CM | POA: Diagnosis present

## 2019-02-02 DIAGNOSIS — B9620 Unspecified Escherichia coli [E. coli] as the cause of diseases classified elsewhere: Secondary | ICD-10-CM | POA: Diagnosis present

## 2019-02-02 DIAGNOSIS — Z7952 Long term (current) use of systemic steroids: Secondary | ICD-10-CM

## 2019-02-02 DIAGNOSIS — K567 Ileus, unspecified: Secondary | ICD-10-CM | POA: Diagnosis not present

## 2019-02-02 DIAGNOSIS — Z87442 Personal history of urinary calculi: Secondary | ICD-10-CM

## 2019-02-02 DIAGNOSIS — J189 Pneumonia, unspecified organism: Secondary | ICD-10-CM | POA: Diagnosis not present

## 2019-02-02 DIAGNOSIS — Z86010 Personal history of colonic polyps: Secondary | ICD-10-CM

## 2019-02-02 DIAGNOSIS — Z7951 Long term (current) use of inhaled steroids: Secondary | ICD-10-CM

## 2019-02-02 DIAGNOSIS — Z9109 Other allergy status, other than to drugs and biological substances: Secondary | ICD-10-CM

## 2019-02-02 DIAGNOSIS — E440 Moderate protein-calorie malnutrition: Secondary | ICD-10-CM | POA: Diagnosis present

## 2019-02-02 DIAGNOSIS — F418 Other specified anxiety disorders: Secondary | ICD-10-CM | POA: Diagnosis not present

## 2019-02-02 DIAGNOSIS — E46 Unspecified protein-calorie malnutrition: Secondary | ICD-10-CM | POA: Diagnosis not present

## 2019-02-02 DIAGNOSIS — R52 Pain, unspecified: Secondary | ICD-10-CM | POA: Diagnosis not present

## 2019-02-02 DIAGNOSIS — K219 Gastro-esophageal reflux disease without esophagitis: Secondary | ICD-10-CM | POA: Diagnosis present

## 2019-02-02 DIAGNOSIS — F419 Anxiety disorder, unspecified: Secondary | ICD-10-CM | POA: Diagnosis present

## 2019-02-02 DIAGNOSIS — R109 Unspecified abdominal pain: Secondary | ICD-10-CM

## 2019-02-02 DIAGNOSIS — L8990 Pressure ulcer of unspecified site, unspecified stage: Secondary | ICD-10-CM | POA: Insufficient documentation

## 2019-02-02 DIAGNOSIS — K460 Unspecified abdominal hernia with obstruction, without gangrene: Secondary | ICD-10-CM | POA: Diagnosis not present

## 2019-02-02 DIAGNOSIS — K5669 Other partial intestinal obstruction: Secondary | ICD-10-CM | POA: Diagnosis not present

## 2019-02-02 DIAGNOSIS — K559 Vascular disorder of intestine, unspecified: Secondary | ICD-10-CM | POA: Diagnosis present

## 2019-02-02 DIAGNOSIS — G8929 Other chronic pain: Secondary | ICD-10-CM | POA: Diagnosis present

## 2019-02-02 DIAGNOSIS — E1140 Type 2 diabetes mellitus with diabetic neuropathy, unspecified: Secondary | ICD-10-CM | POA: Diagnosis present

## 2019-02-02 DIAGNOSIS — M1710 Unilateral primary osteoarthritis, unspecified knee: Secondary | ICD-10-CM | POA: Diagnosis present

## 2019-02-02 DIAGNOSIS — K436 Other and unspecified ventral hernia with obstruction, without gangrene: Secondary | ICD-10-CM | POA: Diagnosis not present

## 2019-02-02 DIAGNOSIS — R1084 Generalized abdominal pain: Secondary | ICD-10-CM | POA: Diagnosis not present

## 2019-02-02 DIAGNOSIS — E876 Hypokalemia: Secondary | ICD-10-CM | POA: Diagnosis not present

## 2019-02-02 DIAGNOSIS — I110 Hypertensive heart disease with heart failure: Secondary | ICD-10-CM | POA: Diagnosis not present

## 2019-02-02 DIAGNOSIS — N390 Urinary tract infection, site not specified: Secondary | ICD-10-CM | POA: Diagnosis present

## 2019-02-02 DIAGNOSIS — E785 Hyperlipidemia, unspecified: Secondary | ICD-10-CM | POA: Diagnosis present

## 2019-02-02 DIAGNOSIS — R2689 Other abnormalities of gait and mobility: Secondary | ICD-10-CM | POA: Diagnosis not present

## 2019-02-02 DIAGNOSIS — Z886 Allergy status to analgesic agent status: Secondary | ICD-10-CM

## 2019-02-02 DIAGNOSIS — G4733 Obstructive sleep apnea (adult) (pediatric): Secondary | ICD-10-CM | POA: Diagnosis present

## 2019-02-02 DIAGNOSIS — K429 Umbilical hernia without obstruction or gangrene: Secondary | ICD-10-CM | POA: Diagnosis not present

## 2019-02-02 DIAGNOSIS — K5900 Constipation, unspecified: Secondary | ICD-10-CM | POA: Diagnosis not present

## 2019-02-02 DIAGNOSIS — Z888 Allergy status to other drugs, medicaments and biological substances status: Secondary | ICD-10-CM

## 2019-02-02 DIAGNOSIS — K56609 Unspecified intestinal obstruction, unspecified as to partial versus complete obstruction: Secondary | ICD-10-CM | POA: Diagnosis present

## 2019-02-02 DIAGNOSIS — Z880 Allergy status to penicillin: Secondary | ICD-10-CM

## 2019-02-02 DIAGNOSIS — L219 Seborrheic dermatitis, unspecified: Secondary | ICD-10-CM

## 2019-02-02 DIAGNOSIS — K922 Gastrointestinal hemorrhage, unspecified: Secondary | ICD-10-CM | POA: Diagnosis not present

## 2019-02-02 LAB — CBC WITH DIFFERENTIAL/PLATELET
Abs Immature Granulocytes: 0.07 10*3/uL (ref 0.00–0.07)
Basophils Absolute: 0.1 10*3/uL (ref 0.0–0.1)
Basophils Relative: 0 %
Eosinophils Absolute: 0.1 10*3/uL (ref 0.0–0.5)
Eosinophils Relative: 0 %
HCT: 48.5 % — ABNORMAL HIGH (ref 36.0–46.0)
Hemoglobin: 15.8 g/dL — ABNORMAL HIGH (ref 12.0–15.0)
Immature Granulocytes: 0 %
Lymphocytes Relative: 13 %
Lymphs Abs: 2.1 10*3/uL (ref 0.7–4.0)
MCH: 29 pg (ref 26.0–34.0)
MCHC: 32.6 g/dL (ref 30.0–36.0)
MCV: 89 fL (ref 80.0–100.0)
Monocytes Absolute: 1.3 10*3/uL — ABNORMAL HIGH (ref 0.1–1.0)
Monocytes Relative: 8 %
Neutro Abs: 12.5 10*3/uL — ABNORMAL HIGH (ref 1.7–7.7)
Neutrophils Relative %: 79 %
Platelets: 290 10*3/uL (ref 150–400)
RBC: 5.45 MIL/uL — ABNORMAL HIGH (ref 3.87–5.11)
RDW: 14.3 % (ref 11.5–15.5)
WBC: 16 10*3/uL — ABNORMAL HIGH (ref 4.0–10.5)
nRBC: 0 % (ref 0.0–0.2)

## 2019-02-02 LAB — SARS CORONAVIRUS 2 BY RT PCR (HOSPITAL ORDER, PERFORMED IN ~~LOC~~ HOSPITAL LAB): SARS Coronavirus 2: NEGATIVE

## 2019-02-02 LAB — URINALYSIS, ROUTINE W REFLEX MICROSCOPIC
Bilirubin Urine: NEGATIVE
Glucose, UA: NEGATIVE mg/dL
Hgb urine dipstick: NEGATIVE
Ketones, ur: 20 mg/dL — AB
Leukocytes,Ua: NEGATIVE
Nitrite: NEGATIVE
Protein, ur: 100 mg/dL — AB
Specific Gravity, Urine: 1.019 (ref 1.005–1.030)
pH: 7 (ref 5.0–8.0)

## 2019-02-02 LAB — COMPREHENSIVE METABOLIC PANEL
ALT: 20 U/L (ref 0–44)
AST: 18 U/L (ref 15–41)
Albumin: 4 g/dL (ref 3.5–5.0)
Alkaline Phosphatase: 82 U/L (ref 38–126)
Anion gap: 15 (ref 5–15)
BUN: 6 mg/dL (ref 6–20)
CO2: 21 mmol/L — ABNORMAL LOW (ref 22–32)
Calcium: 9.5 mg/dL (ref 8.9–10.3)
Chloride: 97 mmol/L — ABNORMAL LOW (ref 98–111)
Creatinine, Ser: 0.72 mg/dL (ref 0.44–1.00)
GFR calc Af Amer: >60 mL/min (ref >60)
GFR calc non Af Amer: >60 mL/min (ref >60)
Glucose, Bld: 116 mg/dL — ABNORMAL HIGH (ref 70–99)
Potassium: 3.8 mmol/L (ref 3.5–5.1)
Sodium: 133 mmol/L — ABNORMAL LOW (ref 135–145)
Total Bilirubin: 1.9 mg/dL — ABNORMAL HIGH (ref 0.3–1.2)
Total Protein: 8.1 g/dL (ref 6.5–8.1)

## 2019-02-02 LAB — LIPASE, BLOOD: Lipase: 22 U/L (ref 11–51)

## 2019-02-02 LAB — I-STAT CHEM 8, ED
BUN: 6 mg/dL (ref 6–20)
Calcium, Ion: 1.15 mmol/L (ref 1.15–1.40)
Chloride: 99 mmol/L (ref 98–111)
Creatinine, Ser: 0.6 mg/dL (ref 0.44–1.00)
Glucose, Bld: 116 mg/dL — ABNORMAL HIGH (ref 70–99)
HCT: 50 % — ABNORMAL HIGH (ref 36.0–46.0)
Hemoglobin: 17 g/dL — ABNORMAL HIGH (ref 12.0–15.0)
Potassium: 3.8 mmol/L (ref 3.5–5.1)
Sodium: 136 mmol/L (ref 135–145)
TCO2: 23 mmol/L (ref 22–32)

## 2019-02-02 LAB — LACTIC ACID, PLASMA
Lactic Acid, Venous: 1.6 mmol/L (ref 0.5–1.9)
Lactic Acid, Venous: 1.6 mmol/L (ref 0.5–1.9)

## 2019-02-02 MED ORDER — TRIAMCINOLONE ACETONIDE 0.1 % EX OINT
TOPICAL_OINTMENT | Freq: Two times a day (BID) | CUTANEOUS | Status: DC
  Administered 2019-02-03 – 2019-02-04 (×2): via TOPICAL
  Administered 2019-02-04: 1 via TOPICAL
  Administered 2019-02-05 – 2019-02-10 (×5): via TOPICAL
  Administered 2019-02-11: 1 via TOPICAL
  Filled 2019-02-02: qty 15

## 2019-02-02 MED ORDER — MOMETASONE FURO-FORMOTEROL FUM 200-5 MCG/ACT IN AERO
2.0000 | INHALATION_SPRAY | Freq: Two times a day (BID) | RESPIRATORY_TRACT | Status: DC
  Administered 2019-02-03 – 2019-02-11 (×15): 2 via RESPIRATORY_TRACT
  Filled 2019-02-02: qty 8.8

## 2019-02-02 MED ORDER — FLUTICASONE PROPIONATE 50 MCG/ACT NA SUSP
1.0000 | Freq: Every day | NASAL | Status: DC
  Administered 2019-02-04 – 2019-02-11 (×7): 1 via NASAL
  Filled 2019-02-02: qty 16

## 2019-02-02 MED ORDER — IOHEXOL 300 MG/ML  SOLN
100.0000 mL | Freq: Once | INTRAMUSCULAR | Status: AC | PRN
  Administered 2019-02-02: 100 mL via INTRAVENOUS

## 2019-02-02 MED ORDER — ONDANSETRON HCL 4 MG/2ML IJ SOLN
4.0000 mg | Freq: Four times a day (QID) | INTRAMUSCULAR | Status: DC | PRN
  Administered 2019-02-03 – 2019-02-10 (×16): 4 mg via INTRAVENOUS
  Filled 2019-02-02 (×16): qty 2

## 2019-02-02 MED ORDER — ALBUTEROL SULFATE (2.5 MG/3ML) 0.083% IN NEBU
2.5000 mg | INHALATION_SOLUTION | RESPIRATORY_TRACT | Status: DC | PRN
  Administered 2019-02-04: 2.5 mg via RESPIRATORY_TRACT
  Filled 2019-02-02: qty 3

## 2019-02-02 MED ORDER — FENTANYL CITRATE (PF) 100 MCG/2ML IJ SOLN
50.0000 ug | Freq: Four times a day (QID) | INTRAMUSCULAR | Status: DC | PRN
  Administered 2019-02-03: 50 ug via INTRAVENOUS
  Filled 2019-02-02: qty 2

## 2019-02-02 MED ORDER — ONDANSETRON HCL 4 MG PO TABS: 4.0000 mg | ORAL_TABLET | Freq: Four times a day (QID) | ORAL | Status: DC | PRN

## 2019-02-02 MED ORDER — ACETAMINOPHEN 10 MG/ML IV SOLN
1000.0000 mg | Freq: Four times a day (QID) | INTRAVENOUS | Status: AC | PRN
  Filled 2019-02-02: qty 100

## 2019-02-02 MED ORDER — FENTANYL CITRATE (PF) 100 MCG/2ML IJ SOLN
50.0000 ug | Freq: Once | INTRAMUSCULAR | Status: AC
  Administered 2019-02-02: 50 ug via INTRAVENOUS
  Filled 2019-02-02: qty 2

## 2019-02-02 MED ORDER — ENOXAPARIN SODIUM 40 MG/0.4ML ~~LOC~~ SOLN: 40.0000 mg | Freq: Every day | SUBCUTANEOUS | Status: DC

## 2019-02-02 NOTE — ED Provider Notes (Signed)
Pt d/w Dr. Redmond Pulling (surgery).  Pt does have a SBO.  He did see pt in consult and will possibly operate tomorrow.  However, she has multiple medical problems, so he requests a medicine admit.  Pt sees the [facility] clinic, so she was d/w [facility] resident for admission.   Isla Pence, MD 02/02/19 2208

## 2019-02-02 NOTE — ED Triage Notes (Signed)
Pt says that she had a salad on Friday and after that she started having abdominal pain, has had some nausea. LBM was on Thursday.

## 2019-02-02 NOTE — ED Provider Notes (Signed)
Care assumed at shift change from Dr. Tamera Punt, pending CMP and CT abdomen pelvis.  Briefly, patient presenting with abdominal pain that began Thursday night after eating a salad.  The pain starts periumbilical, however radiates diffusely.  On exam she is noted to have some firmness in the right lower quadrant, with a questionable hernia.  Concern for bowel obstruction.  CT scan is pending.  Physical Exam  BP 113/63   Pulse 83   Temp 98.3 F (36.8 C) (Oral)   Resp 18   SpO2 94%   Physical Exam Vitals signs and nursing note reviewed.  Constitutional:      General: She is not in acute distress.    Appearance: She is well-developed. She is obese.  HENT:     Head: Normocephalic and atraumatic.  Eyes:     Conjunctiva/sclera: Conjunctivae normal.  Cardiovascular:     Rate and Rhythm: Normal rate.  Pulmonary:     Effort: Pulmonary effort is normal.  Abdominal:     Palpations: Abdomen is soft.  Skin:    General: Skin is warm.  Neurological:     Mental Status: She is alert.  Psychiatric:        Behavior: Behavior normal.     ED Course/Procedures   Clinical Course as of Feb 01 2050  Sun Feb 02, 2019  1947 Patient has no known allergy to iodine that she can recall.  She does say she has an allergy to Lodine and this was an anaphylactic allergy G in 2007.  She states she has no known allergy to iodine in the past.  Per chart review there is no documented CT scan with contrast.  This was discussed with Dr. Laverta Baltimore, who recommends we proceed with scan with contrast.   [JR]  2025 Consulted with Dr. Redmond Pulling with general surgery.  He states he will be admitting the patient and likely performing surgery in the morning.  Requests an NG tube and lactic acid level in the ED.   [JR]    Clinical Course User Index [JR] Ziyon Cedotal, [place] N, PA-C   Results for orders placed or performed during the hospital encounter of 02/02/19  Comprehensive metabolic panel  Result Value Ref Range   Sodium 133 (L) 135  - 145 mmol/L   Potassium 3.8 3.5 - 5.1 mmol/L   Chloride 97 (L) 98 - 111 mmol/L   CO2 21 (L) 22 - 32 mmol/L   Glucose, Bld 116 (H) 70 - 99 mg/dL   BUN 6 6 - 20 mg/dL   Creatinine, Ser 0.72 0.44 - 1.00 mg/dL   Calcium 9.5 8.9 - 10.3 mg/dL   Total Protein 8.1 6.5 - 8.1 g/dL   Albumin 4.0 3.5 - 5.0 g/dL   AST 18 15 - 41 U/L   ALT 20 0 - 44 U/L   Alkaline Phosphatase 82 38 - 126 U/L   Total Bilirubin 1.9 (H) 0.3 - 1.2 mg/dL   GFR calc non Af Amer >60 >60 mL/min   GFR calc Af Amer >60 >60 mL/min   Anion gap 15 5 - 15  Lipase, blood  Result Value Ref Range   Lipase 22 11 - 51 U/L  CBC with Differential  Result Value Ref Range   WBC 16.0 (H) 4.0 - 10.5 K/uL   RBC 5.45 (H) 3.87 - 5.11 MIL/uL   Hemoglobin 15.8 (H) 12.0 - 15.0 g/dL   HCT 48.5 (H) 36.0 - 46.0 %   MCV 89.0 80.0 - 100.0 fL  MCH 29.0 26.0 - 34.0 pg   MCHC 32.6 30.0 - 36.0 g/dL   RDW 14.3 11.5 - 15.5 %   Platelets 290 150 - 400 K/uL   nRBC 0.0 0.0 - 0.2 %   Neutrophils Relative % 79 %   Neutro Abs 12.5 (H) 1.7 - 7.7 K/uL   Lymphocytes Relative 13 %   Lymphs Abs 2.1 0.7 - 4.0 K/uL   Monocytes Relative 8 %   Monocytes Absolute 1.3 (H) 0.1 - 1.0 K/uL   Eosinophils Relative 0 %   Eosinophils Absolute 0.1 0.0 - 0.5 K/uL   Basophils Relative 0 %   Basophils Absolute 0.1 0.0 - 0.1 K/uL   Immature Granulocytes 0 %   Abs Immature Granulocytes 0.07 0.00 - 0.07 K/uL  Urinalysis, Routine w reflex microscopic  Result Value Ref Range   Color, Urine AMBER (A) YELLOW   APPearance HAZY (A) CLEAR   Specific Gravity, Urine 1.019 1.005 - 1.030   pH 7.0 5.0 - 8.0   Glucose, UA NEGATIVE NEGATIVE mg/dL   Hgb urine dipstick NEGATIVE NEGATIVE   Bilirubin Urine NEGATIVE NEGATIVE   Ketones, ur 20 (A) NEGATIVE mg/dL   Protein, ur 100 (A) NEGATIVE mg/dL   Nitrite NEGATIVE NEGATIVE   Leukocytes,Ua NEGATIVE NEGATIVE   RBC / HPF 0-5 0 - 5 RBC/hpf   WBC, UA 6-10 0 - 5 WBC/hpf   Bacteria, UA FEW (A) NONE SEEN   Squamous Epithelial / LPF  6-10 0 - 5   Mucus PRESENT   I-stat chem 8, ED (not at [facility] or [facility])  Result Value Ref Range   Sodium 136 135 - 145 mmol/L   Potassium 3.8 3.5 - 5.1 mmol/L   Chloride 99 98 - 111 mmol/L   BUN 6 6 - 20 mg/dL   Creatinine, Ser 0.60 0.44 - 1.00 mg/dL   Glucose, Bld 116 (H) 70 - 99 mg/dL   Calcium, Ion 1.15 1.15 - 1.40 mmol/L   TCO2 23 22 - 32 mmol/L   Hemoglobin 17.0 (H) 12.0 - 15.0 g/dL   HCT 50.0 (H) 36.0 - 46.0 %   Ct Abdomen Pelvis W Contrast  Result Date: 02/02/2019 CLINICAL DATA:  58 year old female with acute abdominal and pelvic pain. EXAM: CT ABDOMEN AND PELVIS WITH CONTRAST TECHNIQUE: Multidetector CT imaging of the abdomen and pelvis was performed using the standard protocol following bolus administration of intravenous contrast. CONTRAST:  142mL OMNIPAQUE IOHEXOL 300 MG/ML  SOLN COMPARISON:  None. FINDINGS: Lower chest: No acute abnormality Hepatobiliary: The liver and gallbladder are unremarkable. No biliary dilatation. Pancreas: Unremarkable Spleen: Unremarkable Adrenals/Urinary Tract: Kidneys, adrenal glands and bladder are unremarkable except for a probable RIGHT renal cyst. Stomach/Bowel: A large infraumbilical hernia containing small bowel loops is identified. Dilated small bowel loops proximal to this hernia noted compatible with small bowel obstruction. There is some fluid within the terminal ileum and gas within the colon. No bowel wall thickening identified. Vascular/Lymphatic: Mild aortic atherosclerosis. No enlarged abdominal or pelvic lymph nodes. Reproductive: Uterus and bilateral adnexa are unremarkable except for probable small calcified fibroid. Other: Trace amount of free fluid within pelvis is noted. No evidence of abscess or pneumoperitoneum. Musculoskeletal: No acute or suspicious bony abnormalities. IMPRESSION: 1. Large infraumbilical hernia containing small bowel loops and causing small bowel obstruction. Trace amount of free pelvic fluid. No evidence of abscess or  pneumoperitoneum. 2.  Aortic Atherosclerosis (ICD10-I70.0). Electronically Signed   By: Margarette Canada M.D.   On: 02/02/2019 20:04    Procedures  MDM  CT scan with large infraumbilical hernia containing small bowel loops causing a small bowel obstruction.  Discussed results with patient.  She states her symptoms were improved initially with fentanyl, however pain is returning.  Will re-dose.  Her last solid meal was on Friday, she had water today.  Not on anticoagulation.  Recent echo was normal.  Consulted surgeon, Dr. Redmond Pulling will admit and likely perform surgery tomorrow.  Request NG tube and lactic acid level, order placed.Marland Kitchen  COVID test sent.  The patient appears reasonably stabilized for admission considering the current resources, flow, and capabilities available in the ED at this time, and I doubt any other [facility] requiring further screening and/or treatment in the ED prior to admission.   Diagnosis: small bowel obstruction, infraumbilical hernia    Bertie Mcconathy, [place] N, PA-C 02/02/19 2054    Malvin Johns, MD 02/03/19 [phone]

## 2019-02-02 NOTE — H&P (Signed)
[facility] Admission History and Physical Service Pager: [phone]  Patient name: Kelsey Bullock Medical record number: [id number] Date of birth: Aug 30, 1960 Age: 58 y.o. Gender: female  Primary Care Provider: [name], MD Consultants: General Surgery Code Status: FULL Preferred Emergency Contact: Kelsey Bullock [phone]  Chief Complaint: Abdominal pain  Assessment and Plan: Kelsey Bullock is a 58 y.o. female presenting with diffuse abdominal for 5 days. PMH is significant for  has a past medical history of Allergic rhinitis, Asthma, Complication of anesthesia, Congestive heart failure (CHF) ([place]), Depression, Gall stone, GERD (gastroesophageal reflux disease), and PONV (postoperative nausea and vomiting), hyperlipedemia, OSA, prediabetes, and obesity.   SBO likely secondary to adhesions from multiple prior abdominal surgeries  Abdominal pain started after eating a salad on Thursday. Reports abdominal pain and a "knot" in her stomach most likely due to herniation. Was told by her sister to come into the ER.  The pain is 8 of 10 that radiates into her back. Her last bowel movement was friday. She tried miralax with no improvement.  Pertinent surgical history of ovarian cyst removal 21 years ago, Kidney stone surgery converted to open because of scar tissue, two C-sections.  On admission CMP sodium 133 glucose 116.  CBC elevated hemoglobin and hematocrit.  Normal lipase, normal lactic acid.  COVID negative.  Vital signs within normal limits.  CT imaging showed a large infraumbilical hernia containing small bowel loops causing small bowel obstruction.  Surgery was consulted and an NG tube was placed, abdominal x-ray showed NG tube overlying the distal stomach. Preoperative Mortality Predictor Score 2.4%. Surgery planning for likely operation tomorrow. Will risk stratify patient medically before operation. Patient has history of diabetes, but A1C well  controlled at 6.9 in 2019. Will recheck. Patient has HLD, but is already on moderate intensity statin. Currently being held due to patient being NPO. Patient with history of asthma, but lungs clear on exam with NWOB and SORA. Will continue patients controller inhalers.  -Admit to med-surg,  Attending Dr. Chrisandra Bullock -NPO, NG tube to suction -IV zofran -IVF -Lovenox -Tylenol IV, fentanyl for breakthrough pain -Surgery tomorrow -CBC, BMP am -repeat A1c am last was 6.9 [2019] -Surgery consulted in the ED, appreciate recommendations (listed below)  -Tylenol IV, fentanyl for breakthrough pain  [id number] Diastolic HFpEF -Echo [id number] LVEF 60-65% -Appears to be not taking lasixs. Lasix 40mg  last perscribed 05/17/2018. -Does not appear to be fluid overloaded at this time.  GERD -Home meds include tums, pepcid, priolosec -Hold home meds for now. NPO  Asthma  -Continue albuterol nebulizer and dulera -Hold omalizumab, duoneb, atrovent, qvar  while NPO  Obesity She has been trying to lose weight over the past few months and reports that she is lost about 80 pounds intentionally. -No recorded weight for this admission -f/u on wt. For BMI  Allergic rhinitis  -Continue flonase -Hold PO home meds zyrtec, singular, pataday  Depression - hold home meds for now Klonipin 1mg , Remeron   - CIWA scoring  OSA -H/H elevated -CPAP qhs  Chronic back pain -hold home med Fexeril 10mg  for now while NPO  FEN/GI: NPO Prophylaxis: Lovenox  Disposition: Home pending recovery from surgery  History of Present Illness:  Kelsey Bullock is a 58 y.o. female presenting with diffuse and constant abdominal pain for 5 days that gradually worsened after eating a salad on Thursday. Reports abdominal pain and a "knot" in her stomach most likely due to herniation. Was told by her  sister to come into the ER.  The pain is 8 of 10 that radiates into her back.  Pertinent surgical history of ovarian cyst  removal 21 years ago, Kidney stone surgery, tonselectmy, two C-sections.  Her last bowel movement was Friday. She tried miralax with no improvement.  Review Of Systems:   Review of Systems  Gastrointestinal: Positive for abdominal pain, constipation, nausea and vomiting. Negative for blood in stool and diarrhea.    Patient Active Problem List   Diagnosis Date Noted  . Paronychia of great toe of left foot 08/16/2018  . Hx of adenomatous colonic polyps   . History of colonic polyps 03/28/2018  . Chronic diarrhea 03/28/2018  . Pilonidal cyst 02/22/2018  . Diastolic CHF with preserved left ventricular function, NYHA class 2 ([place]) 12/14/2016  . Costochondritis 03/30/2016  . Allergic rhinitis 02/06/2015  . GERD (gastroesophageal reflux disease) 02/06/2015  . Severe persistent asthma 02/06/2015  . Intertrigo 10/22/2014  . Depression, major, recurrent ([place]) 02/19/2014  . Controlled type 2 diabetes mellitus with neuropathy ([place]) 01/25/2011  . SLEEP APNEA 03/10/2009  . Seborrheic eczema 07/17/2007  . LOW BACK PAIN SYNDROME 11/26/2006  . HYPERCHOLESTEROLEMIA 08/02/2006  . Morbid obesity ([place]) 08/02/2006  . ANXIETY 08/02/2006  . PSEUDOTUMOR CEREBRI 08/02/2006  . HYPERTENSION, BENIGN SYSTEMIC 08/02/2006  . OSTEOARTHRITIS, LOWER LEG 08/02/2006  . FIBROMYALGIA, FIBROMYOSITIS 08/02/2006    Past Medical History: Past Medical History:  Diagnosis Date  . Allergic rhinitis   . Asthma   . Complication of anesthesia   . Congestive heart failure (CHF) ([place])   . Depression   . Gall stone   . GERD (gastroesophageal reflux disease)   . PONV (postoperative nausea and vomiting)     Past Surgical History: Past Surgical History:  Procedure Laterality Date  . ADENOIDECTOMY    . BIOPSY  05/27/2018   Procedure: BIOPSY;  Surgeon: Mauri Pole, MD;  Location: [facility];  Service: Endoscopy;;  . CESAREAN SECTION    . COLONOSCOPY WITH PROPOFOL N/A 05/27/2018   Procedure: COLONOSCOPY WITH  PROPOFOL;  Surgeon: Mauri Pole, MD;  Location: [facility];  Service: Endoscopy;  Laterality: N/A;  . CYST EXCISION    . OVARIAN CYST REMOVAL    . POLYPECTOMY  05/27/2018   Procedure: POLYPECTOMY;  Surgeon: Mauri Pole, MD;  Location: [facility];  Service: Endoscopy;;  . removal of gallstones    . TONSILLECTOMY    . TYMPANOSTOMY TUBE PLACEMENT      Social History: Social History   Tobacco Use  . Smoking status: Never Smoker  . Smokeless tobacco: Never Used  Substance Use Topics  . Alcohol use: No  . Drug use: No    Please also refer to relevant sections of EMR.  Family History: Family History  Problem Relation Age of Onset  . Diabetes Mother   . Diabetes Paternal Uncle      Allergies and Medications: Allergies  Allergen Reactions  . Etodolac Shortness Of Breath and Anaphylaxis    REACTION: lips swelled  . Penicillins Anaphylaxis and Swelling    DID THE REACTION INVOLVE: Swelling of the face/tongue/throat, SOB, or low BP? Yes Sudden or severe rash/hives, skin peeling, or the inside of the mouth or nose? No Did it require medical treatment? No When did it last happen?20 years ago If all above answers are "NO", may proceed with cephalosporin use.   Marland Kitchen Amoxicillin     REACTION: unspecified  . Ampicillin Swelling  . Augmentin [Amoxicillin-Pot Clavulanate]   .  Diclofenac Sodium     REACTION: unspecified  . Iodine     REACTION: unspecified  . Pantoprazole Sodium     REACTION: unspecified  . Adhesive [Tape] Rash   Current Facility-Administered Medications on File Prior to Encounter  Medication Dose Route Frequency Provider Last Rate Last Dose  . omalizumab [name]) injection 300 mg  300 mg Subcutaneous Q28 days Jiles Prows, MD   300 mg at 01/24/19 [id number]   Current Outpatient Medications on File Prior to Encounter  Medication Sig Dispense Refill  . acetaminophen (TYLENOL) 500 MG tablet Take 1,000 mg by mouth every 6 (six) hours as needed  for moderate pain or headache.    . albuterol (PROVENTIL HFA;VENTOLIN HFA) 108 (90 Base) MCG/ACT inhaler INHALE 2 PUFFS INTO THE LUNGS EVERY 4 HOURS AS NEEDED FOR WHEEZING 8.5 g 1  . albuterol (PROVENTIL) (2.5 MG/3ML) 0.083% nebulizer solution USE 1 VIAL VIA NEBULIZER EVERY 4 TO 6 HOURS AS NEEDED FOR COUGH OR WHEEZING 225 vial 1  . Ascorbic Acid (VITAMIN C) 100 MG tablet Take 300 mg by mouth daily as needed (immune support).     Marland Kitchen aspirin 81 MG EC tablet Take 1 tablet (81 mg total) by mouth daily. Swallow whole. (Patient not taking: Reported on 06/04/2018) 30 tablet 12  . atorvastatin (LIPITOR) 40 MG tablet Take 1 tablet (40 mg total) by mouth at bedtime. 90 tablet 3  . beclomethasone (QVAR) 80 MCG/ACT inhaler INHALE 2 PUFFS BY MOUTH INTO THE LUNGS TWICE DAILY (Patient taking differently: Inhale 2 puffs into the lungs 2 (two) times daily as needed (sick). ) 8.7 g 3  . calcium carbonate (TUMS - DOSED IN MG ELEMENTAL CALCIUM) 500 MG chewable tablet Chew 2-3 tablets by mouth daily as needed for indigestion or heartburn.    . cetirizine (ZYRTEC) 10 MG tablet TAKE 1 TABLET BY MOUTH EVERY DAY FOR RUNNY NOSE OR ITCHING 30 tablet 2  . CINNAMON PO Take 1 tablet by mouth daily as needed (for high bp).    . clonazePAM (KLONOPIN) 1 MG tablet Take 1 mg by mouth at bedtime.     . cyclobenzaprine (FLEXERIL) 10 MG tablet TAKE 1 TABLET BY MOUTH AT BEDTIME AS NEEDED FOR BACK OR NECK PAIN OR SPASMS 90 tablet 1  . doxycycline (VIBRA-TABS) 100 MG tablet Take 1 tablet (100 mg total) by mouth 2 (two) times daily. 20 tablet 0  . DULERA 200-5 MCG/ACT AERO INHALE 2 PUFFS INTO THE LUNGS TWICE DAILY 13 g 0  . Echinacea 125 MG CAPS Take 125 mg by mouth daily as needed (immune support).     . EPINEPHrine (EPIPEN 2-PAK) 0.3 mg/0.3 mL IJ SOAJ injection Inject 0.3 mLs (0.3 mg total) into the muscle once for 1 dose. 2 Device 3  . erythromycin ophthalmic ointment Place 1 application into both eyes at bedtime.    . famotidine (PEPCID)  20 MG tablet Take 1 tablet (20 mg total) by mouth daily. 90 tablet 3  . fluconazole (DIFLUCAN) 100 MG tablet Take 100 mg by mouth daily as needed (yeast infections).    . fluticasone (FLONASE) 50 MCG/ACT nasal spray USE 2 SPRAYS IN EACH NOSTRIL ONCE DAILY FOR STUFFY NOSE OR DRAINAGE 16 g 2  . fluticasone (FLONASE) 50 MCG/ACT nasal spray INSTILL 2 SPRAYS IN EACH NOSTRIL EVERY DAY FOR STUFFY NOSE OR DRAINAGE. 16 g 2  . furosemide (LASIX) 40 MG tablet TAKE 1 TABLET(40 MG) BY MOUTH TWICE DAILY 180 tablet 3  . ipratropium (ATROVENT) 0.06 %  nasal spray USE 2 SPRAYS IN EACH NOSTRIL EVERY 6 HOURS IF NEEDED TO DRY UP NOSE 15 mL 1  . ipratropium-albuterol (DUONEB) 0.5-2.5 (3) MG/3ML SOLN Take 3 mLs by nebulization every 6 (six) hours as needed. 360 mL 0  . lisinopril (PRINIVIL,ZESTRIL) 40 MG tablet TAKE 1 TABLET BY MOUTH EVERY DAY 90 tablet 3  . Magnesium 250 MG TABS Take 250 mg by mouth daily.    . mirtazapine (REMERON) 45 MG tablet Take 45 mg by mouth at bedtime.     . montelukast (SINGULAIR) 10 MG tablet TAKE 1 TABLET BY MOUTH AT BEDTIME 30 tablet 5  . norethindrone (AYGESTIN) 5 MG tablet Take 2.5 mg by mouth at bedtime.     Marland Kitchen nystatin (MYCOSTATIN) 100000 UNIT/ML suspension Use as directed 5 mLs (500,000 Units total) in the mouth or throat 4 (four) times daily as needed (thrush). 60 mL 0  . nystatin (NYSTATIN) powder APPLY TOPICALLY THREE TIMES DAILY 60 g 2  . omeprazole (PRILOSEC) 40 MG capsule Take 1 capsule (40 mg total) by mouth daily. 30 capsule 5  . PATADAY 0.2 % SOLN INT 1 GTT IN OU D (Patient taking differently: Place 1 drop into both eyes at bedtime. ) 1 Bottle 5  . predniSONE (DELTASONE) 10 MG tablet Take 1 tablet (10 mg total) by mouth 2 (two) times daily with a meal. 40 tablet 0  . terconazole (TERAZOL 7) 0.4 % vaginal cream Place 1 applicator vaginally at bedtime. (Patient taking differently: Place 1 applicator vaginally at bedtime as needed (yeast infections). ) 45 g 0  . triamcinolone  (KENALOG) 0.025 % ointment APPLY 1 APPLICATION TOPICALLY TWICE DAILY 454 g 5  . XOLAIR 150 MG injection INJECT 300 MG UNDER THE SKIN EVERY 28 DAYS. 2 vial 11    Objective: BP 128/61 (BP Location: Right Arm)   Pulse 89   Temp 98.3 F (36.8 C) (Oral)   Resp 18   SpO2 98%  Exam:  General: Appears well, no acute distress. Age appropriate, obese female  Cardiac: RRR, normal heart sounds, no murmurs Respiratory: CTAB, normal effort Abdomen: hard, tender, moderately distended. Decreased bowel sounds Extremities: mild pitting edema  Skin: Warm and dry, no rashes noted Neuro: alert and oriented, no focal deficits Psych: normal affect   Labs and Imaging: CBC BMET  Recent Labs  Lab 02/02/19 1810 02/02/19 1821  WBC 16.0*  --   HGB 15.8* 17.0*  HCT 48.5* 50.0*  PLT 290  --    Recent Labs  Lab 02/02/19 1810 02/02/19 1821  NA 133* 136  K 3.8 3.8  CL 97* 99  CO2 21*  --   BUN 6 6  CREATININE 0.72 0.60  GLUCOSE 116* 116*  CALCIUM 9.5  --      EXAM: CT ABDOMEN AND PELVIS WITH CONTRAST COMPARISON:  None. IMPRESSION: 1. Large infraumbilical hernia containing small bowel loops and causing small bowel obstruction. Trace amount of free pelvic fluid. No evidence of abscess or pneumoperitoneum. 2.  Aortic Atherosclerosis (ICD10-I70.0).  EXAM: ABDOMEN - 1 VIEW COMPARISON:  None.Marland Kitchen IMPRESSION: NG tube tip overlying the distal stomach.    Gerlene Fee, DO 02/02/2019, 10:06 PM PGY-1, [place] Intern pager: [phone], text pages welcome  [place]   I have seen and examined this patient.    I have discussed the findings and exam with the intern and agree with the above note, which I have edited appropriately in [place]. I helped develop the management plan that  is described in the resident's note, and I agree with the content.   Marny Lowenstein, MD, MS FAMILY MEDICINE RESIDENT - PGY3 02/03/2019 12:55 AM

## 2019-02-02 NOTE — ED Provider Notes (Signed)
[facility] EMERGENCY DEPARTMENT Provider Note   CSN: [id number] Arrival date & time: 02/02/19  1543     History   Chief Complaint Chief Complaint  Patient presents with  . Constipation    HPI Kelsey Bullock is a 58 y.o. female.     Patient is a 58 year old female with a history of CHF, GERD and obesity who presents with abdominal pain.  She states that she ate salad with a lot of cucumbers on it 3 days ago.  Since that time she is had worsening pain throughout her abdomen.  It started with cramping in gurgling shortly after eating the salad and then has been intensifying since that time.  She has had some nausea but no vomiting.  She had a good bowel movement the day it started although has not had a good bowel movement since that time.  She had a little bit of gas yesterday with a small amount of stool but none since then.  She denies any fevers.  No urinary symptoms.     Past Medical History:  Diagnosis Date  . Allergic rhinitis   . Asthma   . Complication of anesthesia   . Congestive heart failure (CHF) ([place])   . Depression   . Gall stone   . GERD (gastroesophageal reflux disease)   . PONV (postoperative nausea and vomiting)     Patient Active Problem List   Diagnosis Date Noted  . Paronychia of great toe of left foot 08/16/2018  . Hx of adenomatous colonic polyps   . History of colonic polyps 03/28/2018  . Chronic diarrhea 03/28/2018  . Pilonidal cyst 02/22/2018  . Diastolic CHF with preserved left ventricular function, NYHA class 2 ([place]) 12/14/2016  . Costochondritis 03/30/2016  . Allergic rhinitis 02/06/2015  . GERD (gastroesophageal reflux disease) 02/06/2015  . Severe persistent asthma 02/06/2015  . Intertrigo 10/22/2014  . Depression, major, recurrent ([place]) 02/19/2014  . Controlled type 2 diabetes mellitus with neuropathy ([place]) 01/25/2011  . SLEEP APNEA 03/10/2009  . Seborrheic eczema 07/17/2007  . LOW BACK PAIN SYNDROME 11/26/2006    . HYPERCHOLESTEROLEMIA 08/02/2006  . Morbid obesity ([place]) 08/02/2006  . ANXIETY 08/02/2006  . PSEUDOTUMOR CEREBRI 08/02/2006  . HYPERTENSION, BENIGN SYSTEMIC 08/02/2006  . OSTEOARTHRITIS, LOWER LEG 08/02/2006  . FIBROMYALGIA, FIBROMYOSITIS 08/02/2006    Past Surgical History:  Procedure Laterality Date  . ADENOIDECTOMY    . BIOPSY  05/27/2018   Procedure: BIOPSY;  Surgeon: Mauri Pole, MD;  Location: [facility];  Service: Endoscopy;;  . CESAREAN SECTION    . COLONOSCOPY WITH PROPOFOL N/A 05/27/2018   Procedure: COLONOSCOPY WITH PROPOFOL;  Surgeon: Mauri Pole, MD;  Location: [facility];  Service: Endoscopy;  Laterality: N/A;  . CYST EXCISION    . OVARIAN CYST REMOVAL    . POLYPECTOMY  05/27/2018   Procedure: POLYPECTOMY;  Surgeon: Mauri Pole, MD;  Location: [facility];  Service: Endoscopy;;  . removal of gallstones    . TONSILLECTOMY    . TYMPANOSTOMY TUBE PLACEMENT       OB History   No obstetric history on file.      Home Medications    Prior to Admission medications   Medication Sig Start Date End Date Taking? Authorizing Provider  acetaminophen (TYLENOL) 500 MG tablet Take 1,000 mg by mouth every 6 (six) hours as needed for moderate pain or headache.    [provider]  albuterol (PROVENTIL HFA;VENTOLIN HFA) 108 (90 Base) MCG/ACT inhaler INHALE 2  PUFFS INTO THE LUNGS EVERY 4 HOURS AS NEEDED FOR WHEEZING 09/06/18   Kozlow, Donnamarie Poag, MD  albuterol (PROVENTIL) (2.5 MG/3ML) 0.083% nebulizer solution USE 1 VIAL VIA NEBULIZER EVERY 4 TO 6 HOURS AS NEEDED FOR COUGH OR WHEEZING 04/18/18   Kozlow, Donnamarie Poag, MD  Ascorbic Acid (VITAMIN C) 100 MG tablet Take 300 mg by mouth daily as needed (immune support).     [provider]  aspirin 81 MG EC tablet Take 1 tablet (81 mg total) by mouth daily. Swallow whole. Patient not taking: Reported on 06/04/2018 03/29/12   [name], MD  atorvastatin (LIPITOR) 40 MG tablet Take 1 tablet (40  mg total) by mouth at bedtime. 06/19/18   [name], MD  beclomethasone (QVAR) 80 MCG/ACT inhaler INHALE 2 PUFFS BY MOUTH INTO THE LUNGS TWICE DAILY Patient taking differently: Inhale 2 puffs into the lungs 2 (two) times daily as needed (sick).  03/19/18   Kozlow, Donnamarie Poag, MD  calcium carbonate (TUMS - DOSED IN MG ELEMENTAL CALCIUM) 500 MG chewable tablet Chew 2-3 tablets by mouth daily as needed for indigestion or heartburn.    [provider]  cetirizine (ZYRTEC) 10 MG tablet TAKE 1 TABLET BY MOUTH EVERY DAY FOR RUNNY NOSE OR ITCHING 08/09/18   Kozlow, Donnamarie Poag, MD  CINNAMON PO Take 1 tablet by mouth daily as needed (for high bp).    [provider]  clonazePAM (KLONOPIN) 1 MG tablet Take 1 mg by mouth at bedtime.     [provider]  cyclobenzaprine (FLEXERIL) 10 MG tablet TAKE 1 TABLET BY MOUTH AT BEDTIME AS NEEDED FOR BACK OR NECK PAIN OR SPASMS 06/13/18   Hensel, [name], MD  doxycycline (VIBRA-TABS) 100 MG tablet Take 1 tablet (100 mg total) by mouth 2 (two) times daily. 08/16/18   Diallo, Earna Coder, MD  DULERA 200-5 MCG/ACT AERO INHALE 2 PUFFS INTO THE LUNGS TWICE DAILY 11/01/18   Kozlow, Donnamarie Poag, MD  Echinacea 125 MG CAPS Take 125 mg by mouth daily as needed (immune support).     [provider]  EPINEPHrine (EPIPEN 2-PAK) 0.3 mg/0.3 mL IJ SOAJ injection Inject 0.3 mLs (0.3 mg total) into the muscle once for 1 dose. 04/18/18 06/04/18  Kozlow, Donnamarie Poag, MD  erythromycin ophthalmic ointment Place 1 application into both eyes at bedtime.    [provider]  famotidine (PEPCID) 20 MG tablet Take 1 tablet (20 mg total) by mouth daily. 06/04/18   Kozlow, Donnamarie Poag, MD  fluconazole (DIFLUCAN) 100 MG tablet Take 100 mg by mouth daily as needed (yeast infections).    [provider]  fluticasone (FLONASE) 50 MCG/ACT nasal spray USE 2 SPRAYS IN EACH NOSTRIL ONCE DAILY FOR STUFFY NOSE OR DRAINAGE 09/06/18   Kozlow, Donnamarie Poag, MD  fluticasone (FLONASE) 50  MCG/ACT nasal spray INSTILL 2 SPRAYS IN EACH NOSTRIL EVERY DAY FOR STUFFY NOSE OR DRAINAGE. 09/06/18   Kozlow, Donnamarie Poag, MD  furosemide (LASIX) 40 MG tablet TAKE 1 TABLET(40 MG) BY MOUTH TWICE DAILY 06/04/18   Kozlow, Donnamarie Poag, MD  ipratropium (ATROVENT) 0.06 % nasal spray USE 2 SPRAYS IN EACH NOSTRIL EVERY 6 HOURS IF NEEDED TO DRY UP NOSE 08/09/18   Kozlow, Donnamarie Poag, MD  ipratropium-albuterol (DUONEB) 0.5-2.5 (3) MG/3ML SOLN Take 3 mLs by nebulization every 6 (six) hours as needed. 06/04/18   Kozlow, Donnamarie Poag, MD  lisinopril (PRINIVIL,ZESTRIL) 40 MG tablet TAKE 1 TABLET BY MOUTH EVERY DAY 09/09/18   Dorcas Mcmurray  L, MD  Magnesium 250 MG TABS Take 250 mg by mouth daily.    [provider]  mirtazapine (REMERON) 45 MG tablet Take 45 mg by mouth at bedtime.  10/13/15   [provider]  montelukast (SINGULAIR) 10 MG tablet TAKE 1 TABLET BY MOUTH AT BEDTIME 07/25/18   Kozlow, Donnamarie Poag, MD  norethindrone (AYGESTIN) 5 MG tablet Take 2.5 mg by mouth at bedtime.     [provider]  nystatin (MYCOSTATIN) 100000 UNIT/ML suspension Use as directed 5 mLs (500,000 Units total) in the mouth or throat 4 (four) times daily as needed (thrush). 06/06/18   Kozlow, Donnamarie Poag, MD  nystatin (NYSTATIN) powder APPLY TOPICALLY THREE TIMES DAILY 09/06/18   [name], MD  omeprazole (PRILOSEC) 40 MG capsule Take 1 capsule (40 mg total) by mouth daily. 06/04/18   Kozlow, Donnamarie Poag, MD  PATADAY 0.2 % SOLN INT 1 GTT IN OU D Patient taking differently: Place 1 drop into both eyes at bedtime.  03/19/18   Kozlow, Donnamarie Poag, MD  predniSONE (DELTASONE) 10 MG tablet Take 1 tablet (10 mg total) by mouth 2 (two) times daily with a meal. 06/04/18   Kozlow, Donnamarie Poag, MD  terconazole (TERAZOL 7) 0.4 % vaginal cream Place 1 applicator vaginally at bedtime. Patient taking differently: Place 1 applicator vaginally at bedtime as needed (yeast infections).  07/09/17   [name], MD  triamcinolone (KENALOG) 0.025 % ointment APPLY 1  APPLICATION TOPICALLY TWICE DAILY 06/19/18   [name], MD  XOLAIR 150 MG injection INJECT 300 MG UNDER THE SKIN EVERY 28 DAYS. 06/07/18   Kozlow, Donnamarie Poag, MD    Family History Family History  Problem Relation Age of Onset  . Diabetes Mother   . Diabetes Paternal Uncle     Social History Social History   Tobacco Use  . Smoking status: Never Smoker  . Smokeless tobacco: Never Used  Substance Use Topics  . Alcohol use: No  . Drug use: No     Allergies   Etodolac, Penicillins, Amoxicillin, Ampicillin, Augmentin [amoxicillin-pot clavulanate], Diclofenac sodium, Iodine, Pantoprazole sodium, and Adhesive [tape]   Review of Systems Review of Systems  Constitutional: Negative for chills, diaphoresis, fatigue and fever.  HENT: Negative for congestion, rhinorrhea and sneezing.   Eyes: Negative.   Respiratory: Negative for cough, chest tightness and shortness of breath.   Cardiovascular: Negative for chest pain and leg swelling.  Gastrointestinal: Positive for abdominal pain, nausea and vomiting. Negative for blood in stool and diarrhea.  Genitourinary: Negative for difficulty urinating, flank pain, frequency and hematuria.  Musculoskeletal: Negative for arthralgias and back pain.  Skin: Negative for rash.  Neurological: Negative for dizziness, speech difficulty, weakness, numbness and headaches.     Physical Exam Updated Vital Signs BP 113/63   Pulse 83   Temp 98.3 F (36.8 C) (Oral)   Resp 18   SpO2 94%   Physical Exam Constitutional:      Appearance: She is well-developed.  HENT:     Head: Normocephalic and atraumatic.  Eyes:     Pupils: Pupils are equal, round, and reactive to light.  Neck:     Musculoskeletal: Normal range of motion and neck supple.  Cardiovascular:     Rate and Rhythm: Normal rate and regular rhythm.     Heart sounds: Normal heart sounds.  Pulmonary:     Effort: Pulmonary effort is normal. No respiratory distress.     Breath sounds:  Normal breath sounds.  No wheezing or rales.  Chest:     Chest wall: No tenderness.  Abdominal:     General: Bowel sounds are normal.     Palpations: Abdomen is soft.     Tenderness: There is no abdominal tenderness (Generalized). There is no guarding or rebound.  Musculoskeletal: Normal range of motion.  Lymphadenopathy:     Cervical: No cervical adenopathy.  Skin:    General: Skin is warm and dry.     Findings: No rash.  Neurological:     Mental Status: She is alert and oriented to person, place, and time.      ED Treatments / Results  Labs (all labs ordered are listed, but only abnormal results are displayed) Labs Reviewed  CBC WITH DIFFERENTIAL/PLATELET - Abnormal; Notable for the following components:      Result Value   WBC 16.0 (*)    RBC 5.45 (*)    Hemoglobin 15.8 (*)    HCT 48.5 (*)    Neutro Abs 12.5 (*)    Monocytes Absolute 1.3 (*)    All other components within normal limits  I-STAT CHEM 8, ED - Abnormal; Notable for the following components:   Glucose, Bld 116 (*)    Hemoglobin 17.0 (*)    HCT 50.0 (*)    All other components within normal limits  COMPREHENSIVE METABOLIC PANEL  LIPASE, BLOOD  URINALYSIS, ROUTINE W REFLEX MICROSCOPIC    EKG None  Radiology No results found.  Procedures Procedures (including critical care time)  Medications Ordered in ED Medications  fentaNYL (SUBLIMAZE) injection 50 mcg (50 mcg Intravenous Given 02/02/19 1837)     Initial Impression / Assessment and Plan / ED Course  I have reviewed the triage vital signs and the nursing notes.  Pertinent labs & imaging results that were available during my care of the patient were reviewed by me and considered in my medical decision making (see chart for details).        Patient is 58 year old female who presents with diffuse abdominal pain with nausea.  She is awaiting CT scan.  Her white count is elevated at 16,000 but her other labs are pending.  [place], PA-C to f/u on  labs/CT.  Final Clinical Impressions(s) / ED Diagnoses   Final diagnoses:  None    ED Discharge Orders    None       Malvin Johns, MD 02/02/19 [facility] Hare

## 2019-02-02 NOTE — ED Triage Notes (Signed)
Pt arrives via [facility] with c/o constipation. She ate a salad on Friday and has had some abdominal pain and constipation, has not been able to eat since. BP 120 palpated, HR 100, 97% RA, 115 CBG, 97.7, r 23

## 2019-02-02 NOTE — Consult Note (Signed)
Reason for Consult:sbo in hernia Referring Physician: Martinique Robinson PA  Kelsey Bullock is an 58 y.o. female.  HPI: 57 year old female with severe obesity, anxiety, prediabetes, depression, hypertension hyperlipidemia obstructive sleep apnea not on CPAP comes into the emergency room because of persistent abdominal pain which started on Thursday.  The pain is constant but will wax and wane at times.  She reports that she had a salad on Thursday and then went out to eat at [employer] for her birthday.  She started having some discomfort that evening.  She did have a soft bowel movement on Thursday.  She also reports having a soft very small bowel movement on Friday with some flatus but has been obstipated since then.  The pain feels like it goes into her back a little bit but she also has some chronic back issues.  She denies any vomiting, fever or chills.  May be some nausea.  She has had 2 C-sections.  She had an ovarian cystectomy through a Pfannenstiel incision.  She states that they tried to do it laparoscopic but had to convert to open because of too much scar tissue.  She tried MiraLAX twice today without any improvement.  She has been trying to lose weight over the past few months and reports that she is lost about 80 pounds intentionally.  She denies any tobacco alcohol or drug use.  Past Medical History:  Diagnosis Date   Allergic rhinitis    Asthma    Complication of anesthesia    Congestive heart failure (CHF) ([place])    Depression    Gall stone    GERD (gastroesophageal reflux disease)    PONV (postoperative nausea and vomiting)     Past Surgical History:  Procedure Laterality Date   ADENOIDECTOMY     BIOPSY  05/27/2018   Procedure: BIOPSY;  Surgeon: Mauri Pole, MD;  Location: [facility];  Service: Endoscopy;;   CESAREAN SECTION     COLONOSCOPY WITH PROPOFOL N/A 05/27/2018   Procedure: COLONOSCOPY WITH PROPOFOL;  Surgeon: Mauri Pole, MD;   Location: [facility];  Service: Endoscopy;  Laterality: N/A;   CYST EXCISION     OVARIAN CYST REMOVAL     POLYPECTOMY  05/27/2018   Procedure: POLYPECTOMY;  Surgeon: Mauri Pole, MD;  Location: [facility];  Service: Endoscopy;;   removal of gallstones     TONSILLECTOMY     TYMPANOSTOMY TUBE PLACEMENT      Family History  Problem Relation Age of Onset   Diabetes Mother    Diabetes Paternal Uncle     Social History:  reports that she has never smoked. She has never used smokeless tobacco. She reports that she does not drink alcohol or use drugs.  Allergies:  Allergies  Allergen Reactions   Etodolac Shortness Of Breath and Anaphylaxis    REACTION: lips swelled   Penicillins Anaphylaxis and Swelling    DID THE REACTION INVOLVE: Swelling of the face/tongue/throat, SOB, or low BP? Yes Sudden or severe rash/hives, skin peeling, or the inside of the mouth or nose? No Did it require medical treatment? No When did it last happen?20 years ago If all above answers are NO, may proceed with cephalosporin use.    Amoxicillin     REACTION: unspecified   Ampicillin Swelling   Augmentin [Amoxicillin-Pot Clavulanate]    Diclofenac Sodium     REACTION: unspecified   Iodine     REACTION: unspecified   Pantoprazole Sodium     REACTION:  unspecified   Adhesive [Tape] Rash    Medications: I have reviewed the patient's current medications.  Results for orders placed or performed during the hospital encounter of 02/02/19 (from the past 48 hour(s))  Comprehensive metabolic panel     Status: Abnormal   Collection Time: 02/02/19  6:10 PM  Result Value Ref Range   Sodium 133 (L) 135 - 145 mmol/L   Potassium 3.8 3.5 - 5.1 mmol/L   Chloride 97 (L) 98 - 111 mmol/L   CO2 21 (L) 22 - 32 mmol/L   Glucose, Bld 116 (H) 70 - 99 mg/dL   BUN 6 6 - 20 mg/dL   Creatinine, Ser 0.72 0.44 - 1.00 mg/dL   Calcium 9.5 8.9 - 10.3 mg/dL   Total Protein 8.1 6.5 - 8.1 g/dL    Albumin 4.0 3.5 - 5.0 g/dL   AST 18 15 - 41 U/L   ALT 20 0 - 44 U/L   Alkaline Phosphatase 82 38 - 126 U/L   Total Bilirubin 1.9 (H) 0.3 - 1.2 mg/dL   GFR calc non Af Amer >60 >60 mL/min   GFR calc Af Amer >60 >60 mL/min   Anion gap 15 5 - 15    Comment: Performed at [facility], [address], [place], [place] [address]  Lipase, blood     Status: None   Collection Time: 02/02/19  6:10 PM  Result Value Ref Range   Lipase 22 11 - 51 U/L    Comment: Performed at [facility] [address] [place], [place] [address]  CBC with Differential     Status: Abnormal   Collection Time: 02/02/19  6:10 PM  Result Value Ref Range   WBC 16.0 (H) 4.0 - 10.5 K/uL   RBC 5.45 (H) 3.87 - 5.11 MIL/uL   Hemoglobin 15.8 (H) 12.0 - 15.0 g/dL   HCT 48.5 (H) 36.0 - 46.0 %   MCV 89.0 80.0 - 100.0 fL   MCH 29.0 26.0 - 34.0 pg   MCHC 32.6 30.0 - 36.0 g/dL   RDW 14.3 11.5 - 15.5 %   Platelets 290 150 - 400 K/uL   nRBC 0.0 0.0 - 0.2 %   Neutrophils Relative % 79 %   Neutro Abs 12.5 (H) 1.7 - 7.7 K/uL   Lymphocytes Relative 13 %   Lymphs Abs 2.1 0.7 - 4.0 K/uL   Monocytes Relative 8 %   Monocytes Absolute 1.3 (H) 0.1 - 1.0 K/uL   Eosinophils Relative 0 %   Eosinophils Absolute 0.1 0.0 - 0.5 K/uL   Basophils Relative 0 %   Basophils Absolute 0.1 0.0 - 0.1 K/uL   Immature Granulocytes 0 %   Abs Immature Granulocytes 0.07 0.00 - 0.07 K/uL    Comment: Performed at [facility], [place] [address], [place], [place] [address]  I-stat chem 8, ED (not at [facility] or [facility])     Status: Abnormal   Collection Time: 02/02/19  6:21 PM  Result Value Ref Range   Sodium 136 135 - 145 mmol/L   Potassium 3.8 3.5 - 5.1 mmol/L   Chloride 99 98 - 111 mmol/L   BUN 6 6 - 20 mg/dL   Creatinine, Ser 0.60 0.44 - 1.00 mg/dL   Glucose, Bld 116 (H) 70 - 99 mg/dL   Calcium, Ion 1.15 1.15 - 1.40 mmol/L   TCO2 23 22 - 32 mmol/L   Hemoglobin 17.0 (H) 12.0 - 15.0 g/dL   HCT 50.0 (  H) 36.0 - 46.0 %  Urinalysis,  Routine w reflex microscopic     Status: Abnormal   Collection Time: 02/02/19  8:04 PM  Result Value Ref Range   Color, Urine AMBER (A) YELLOW    Comment: BIOCHEMICALS MAY BE AFFECTED BY COLOR   APPearance HAZY (A) CLEAR   Specific Gravity, Urine 1.019 1.005 - 1.030   pH 7.0 5.0 - 8.0   Glucose, UA NEGATIVE NEGATIVE mg/dL   Hgb urine dipstick NEGATIVE NEGATIVE   Bilirubin Urine NEGATIVE NEGATIVE   Ketones, ur 20 (A) NEGATIVE mg/dL   Protein, ur 100 (A) NEGATIVE mg/dL   Nitrite NEGATIVE NEGATIVE   Leukocytes,Ua NEGATIVE NEGATIVE   RBC / HPF 0-5 0 - 5 RBC/hpf   WBC, UA 6-10 0 - 5 WBC/hpf   Bacteria, UA FEW (A) NONE SEEN   Squamous Epithelial / LPF 6-10 0 - 5   Mucus PRESENT     Comment: Performed at [facility], [address], [place], [place] [address]  Lactic acid, plasma     Status: None   Collection Time: 02/02/19  8:25 PM  Result Value Ref Range   Lactic Acid, Venous 1.6 0.5 - 1.9 mmol/L    Comment: Performed at [facility] [address], [place], [place] [address]  SARS Coronavirus 2 [facility] order, Performed in [facility] hospital lab) Nasopharyngeal Nasopharyngeal Swab     Status: None   Collection Time: 02/02/19  8:49 PM   Specimen: Nasopharyngeal Swab  Result Value Ref Range   SARS Coronavirus 2 NEGATIVE NEGATIVE    Comment: (NOTE) If result is NEGATIVE SARS-CoV-2 target nucleic acids are NOT DETECTED. The SARS-CoV-2 RNA is generally detectable in upper and lower  respiratory specimens during the acute phase of infection. The lowest  concentration of SARS-CoV-2 viral copies this assay can detect is 250  copies / mL. A negative result does not preclude SARS-CoV-2 infection  and should not be used as the sole basis for treatment or other  patient management decisions.  A negative result may occur with  improper specimen collection / handling, submission of specimen other  than nasopharyngeal swab, presence of viral mutation(s) within the  areas  targeted by this assay, and inadequate number of viral copies  (<250 copies / mL). A negative result must be combined with clinical  observations, patient history, and epidemiological information. If result is POSITIVE SARS-CoV-2 target nucleic acids are DETECTED. The SARS-CoV-2 RNA is generally detectable in upper and lower  respiratory specimens dur ing the acute phase of infection.  Positive  results are indicative of active infection with SARS-CoV-2.  Clinical  correlation with patient history and other diagnostic information is  necessary to determine patient infection status.  Positive results do  not rule out bacterial infection or co-infection with other viruses. If result is PRESUMPTIVE POSTIVE SARS-CoV-2 nucleic acids MAY BE PRESENT.   A presumptive positive result was obtained on the submitted specimen  and confirmed on repeat testing.  While 2019 novel coronavirus  (SARS-CoV-2) nucleic acids may be present in the submitted sample  additional confirmatory testing may be necessary for epidemiological  and / or clinical management purposes  to differentiate between  SARS-CoV-2 and other Sarbecovirus currently known to infect humans.  If clinically indicated additional testing with an alternate test  methodology [phone]) is advised. The SARS-CoV-2 RNA is generally  detectable in upper and lower respiratory sp ecimens during the acute  phase of infection. The expected result is  Negative. Fact Sheet for Patients:  StrictlyIdeas.no Fact Sheet for Healthcare Providers: BankingDealers.co.za This test is not yet approved or cleared by the [place] FDA and has been authorized for detection and/or diagnosis of SARS-CoV-2 by FDA under an Emergency Use Authorization (EUA).  This EUA will remain in effect (meaning this test can be used) for the duration of the COVID-19 declaration under Section 564(b)(1) of the Act, 21 U.S.C. section  360bbb-3(b)(1), unless the authorization is terminated or revoked sooner. Performed at [facility], [place] [address], [place], [place] [address]     Dg Abdomen 1 View  Result Date: 02/02/2019 CLINICAL DATA:  NG tube placement. EXAM: ABDOMEN - 1 VIEW COMPARISON:  None. FINDINGS: An NG tube is identified with tip overlying the distal stomach. IMPRESSION: NG tube tip overlying the distal stomach. Electronically Signed   By: Margarette Canada M.D.   On: 02/02/2019 21:54   Ct Abdomen Pelvis W Contrast  Result Date: 02/02/2019 CLINICAL DATA:  58 year old female with acute abdominal and pelvic pain. EXAM: CT ABDOMEN AND PELVIS WITH CONTRAST TECHNIQUE: Multidetector CT imaging of the abdomen and pelvis was performed using the standard protocol following bolus administration of intravenous contrast. CONTRAST:  110mL OMNIPAQUE IOHEXOL 300 MG/ML  SOLN COMPARISON:  None. FINDINGS: Lower chest: No acute abnormality Hepatobiliary: The liver and gallbladder are unremarkable. No biliary dilatation. Pancreas: Unremarkable Spleen: Unremarkable Adrenals/Urinary Tract: Kidneys, adrenal glands and bladder are unremarkable except for a probable RIGHT renal cyst. Stomach/Bowel: A large infraumbilical hernia containing small bowel loops is identified. Dilated small bowel loops proximal to this hernia noted compatible with small bowel obstruction. There is some fluid within the terminal ileum and gas within the colon. No bowel wall thickening identified. Vascular/Lymphatic: Mild aortic atherosclerosis. No enlarged abdominal or pelvic lymph nodes. Reproductive: Uterus and bilateral adnexa are unremarkable except for probable small calcified fibroid. Other: Trace amount of free fluid within pelvis is noted. No evidence of abscess or pneumoperitoneum. Musculoskeletal: No acute or suspicious bony abnormalities. IMPRESSION: 1. Large infraumbilical hernia containing small bowel loops and causing small bowel obstruction. Trace amount  of free pelvic fluid. No evidence of abscess or pneumoperitoneum. 2.  Aortic Atherosclerosis (ICD10-I70.0). Electronically Signed   By: Margarette Canada M.D.   On: 02/02/2019 20:04    Review of Systems  Constitutional: Negative for weight loss.  HENT: Negative for nosebleeds.   Eyes: Negative for blurred vision.  Respiratory: Negative for shortness of breath.        States she has OSA but can't tolerate cpap  Cardiovascular: Positive for leg swelling (ankle swelling). Negative for chest pain, palpitations, orthopnea and PND.       + DOE, gets SOB walking short distances  Gastrointestinal: Positive for abdominal pain and nausea.  Genitourinary: Negative for dysuria and hematuria.  Musculoskeletal: Positive for back pain.  Skin: Negative for itching and rash.  Neurological: Negative for dizziness, focal weakness, seizures, loss of consciousness and headaches.       Denies TIAs, amaurosis fugax  Endo/Heme/Allergies: Does not bruise/bleed easily.  Psychiatric/Behavioral: The patient is nervous/anxious.    Blood pressure 121/69, pulse 90, temperature 98.3 F (36.8 C), temperature source Oral, resp. rate 17, SpO2 98 %. Physical Exam  Vitals reviewed. Constitutional: She is oriented to person, place, and time. She appears well-developed and well-nourished. No distress.  Severe obesity  HENT:  Head: Normocephalic and atraumatic.  Right Ear: External ear normal.  Left Ear: External ear normal.  Eyes: Conjunctivae are normal. No scleral  icterus.  Neck: Normal range of motion. Neck supple. No tracheal deviation present. No thyromegaly present.  Cardiovascular: Normal rate and normal heart sounds.  Respiratory: Effort normal and breath sounds normal. No stridor. No respiratory distress. She has no wheezes.  GI: Soft. She exhibits no distension. There is abdominal tenderness. There is no rebound and no guarding. A hernia is present. Hernia confirmed positive in the ventral area.    Large panus,  +infraumbilical bulge - firm in hernia area, o/w abd soft, some TTP over hernia. No skin changes. Old trocar scar at umbilicus. No rebound. No peritonitis.   Musculoskeletal:        General: No tenderness or edema.  Lymphadenopathy:    She has no cervical adenopathy.  Neurological: She is alert and oriented to person, place, and time. She exhibits normal muscle tone.  Skin: Skin is warm and dry. No rash noted. She is not diaphoretic. No erythema. No pallor.  Psychiatric: She has a normal mood and affect. Her behavior is normal. Judgment and thought content normal.    Assessment/Plan: Incarcerated ventral hernia Small bowel obstruction due to above Severe obesity Hypertension Hyperlipidemia PreDiabetes Obstructive sleep apnea  I personally reviewed her CT scan.  She has some small bowel herniated through an infraumbilical fascial defect.  Defect is moderate size of around 4 cm wide by either 4 cm vertical or perhaps 6 cm vertical.  There is no signs of strangulation.  Her lactate is normal.  No signs of pneumatosis or portal venous gas.  I think her white count 16,000 is actually hemoconcentration given her hemoglobin of 16 and does not truly reflect a leukocytosis. Her abdominal exam is also reassuring.  Therefore I do not believe she needs emergent surgical intervention this evening  Recommend admission to medical service N.p.o., NG tube to low intermittent wall suction IV fluid resuscitation Chemical DVT prophylaxis given her obesity-May have prophylactic dose of subcu heparin this evening or subcu Lovenox this evening Tentative plan is operative exploration tomorrow with Dr. Georgette Dover, open repair of the incarcerated ventral hernia, possible mesh placement   I discussed the typical hospital course as well as operative course.  Discussed primary objective during surgery will be to reduce the herniated bowel contents and then closed the abdominal wall.  Mesh may or may not be used depending  on intra-abdominal findings.  Nonetheless she is at high risk for recurrent hernia formation given her underlying obesity.  We also talked about bleeding, infection, injury to surrounding structures, possible need for bowel resection which I think is unlikely, wound infection, blood clot formation perioperative cardiac and pulmonary events, postoperative ileus, need for additional procedures & the typical recovery course  Leighton Ruff. Redmond Pulling, MD, FACS General, Bariatric, & Minimally Invasive Surgery [facility]    Greer Pickerel 02/02/2019, 11:12 PM

## 2019-02-02 NOTE — ED Notes (Signed)
Pt sister would like a call back, would like an update on patient Please call [name] [phone]

## 2019-02-03 ENCOUNTER — Inpatient Hospital Stay (HOSPITAL_COMMUNITY): Payer: Medicaid Other | Admitting: Anesthesiology

## 2019-02-03 ENCOUNTER — Encounter (HOSPITAL_COMMUNITY): Payer: Self-pay

## 2019-02-03 ENCOUNTER — Encounter (HOSPITAL_COMMUNITY)
Admission: EM | Disposition: A | Discharge status: Home Health | Payer: Self-pay | Source: Home / Self Care | Attending: Family Medicine

## 2019-02-03 DIAGNOSIS — K56609 Unspecified intestinal obstruction, unspecified as to partial versus complete obstruction: Secondary | ICD-10-CM

## 2019-02-03 DIAGNOSIS — J4540 Moderate persistent asthma, uncomplicated: Secondary | ICD-10-CM | POA: Insufficient documentation

## 2019-02-03 DIAGNOSIS — K460 Unspecified abdominal hernia with obstruction, without gangrene: Secondary | ICD-10-CM

## 2019-02-03 HISTORY — PX: INGUINAL HERNIA REPAIR: SHX194

## 2019-02-03 HISTORY — PX: EXPLORATORY LAPAROTOMY: SUR591

## 2019-02-03 LAB — HEMOGLOBIN A1C
Hgb A1c MFr Bld: 6.1 % — ABNORMAL HIGH (ref 4.8–5.6)
Mean Plasma Glucose: 128.37 mg/dL

## 2019-02-03 LAB — GLUCOSE, CAPILLARY: Glucose-Capillary: 150 mg/dL — ABNORMAL HIGH (ref 70–99)

## 2019-02-03 LAB — BASIC METABOLIC PANEL
Anion gap: 15 (ref 5–15)
BUN: 7 mg/dL (ref 6–20)
CO2: 22 mmol/L (ref 22–32)
Calcium: 9.2 mg/dL (ref 8.9–10.3)
Chloride: 96 mmol/L — ABNORMAL LOW (ref 98–111)
Creatinine, Ser: 0.76 mg/dL (ref 0.44–1.00)
GFR calc Af Amer: >60 mL/min (ref >60)
GFR calc non Af Amer: >60 mL/min (ref >60)
Glucose, Bld: 148 mg/dL — ABNORMAL HIGH (ref 70–99)
Potassium: 3.9 mmol/L (ref 3.5–5.1)
Sodium: 133 mmol/L — ABNORMAL LOW (ref 135–145)

## 2019-02-03 LAB — CBC
HCT: 44.7 % (ref 36.0–46.0)
Hemoglobin: 14.7 g/dL (ref 12.0–15.0)
MCH: 29.1 pg (ref 26.0–34.0)
MCHC: 32.9 g/dL (ref 30.0–36.0)
MCV: 88.3 fL (ref 80.0–100.0)
Platelets: 233 10*3/uL (ref 150–400)
RBC: 5.06 MIL/uL (ref 3.87–5.11)
RDW: 14.3 % (ref 11.5–15.5)
WBC: 14.9 10*3/uL — ABNORMAL HIGH (ref 4.0–10.5)
nRBC: 0 % (ref 0.0–0.2)

## 2019-02-03 LAB — SURGICAL PCR SCREEN
MRSA, PCR: NEGATIVE
Staphylococcus aureus: NEGATIVE

## 2019-02-03 LAB — HIV ANTIBODY (ROUTINE TESTING W REFLEX): HIV Screen 4th Generation wRfx: NONREACTIVE

## 2019-02-03 SURGERY — REPAIR, HERNIA, INGUINAL, INCARCERATED
Anesthesia: General | Site: Abdomen

## 2019-02-03 MED ORDER — BUPIVACAINE-EPINEPHRINE (PF) 0.25% -1:200000 IJ SOLN
INTRAMUSCULAR | Status: AC
  Filled 2019-02-03: qty 30

## 2019-02-03 MED ORDER — SODIUM CHLORIDE 0.9 % IV SOLN
INTRAVENOUS | Status: AC
  Administered 2019-02-03: 03:00:00 via INTRAVENOUS

## 2019-02-03 MED ORDER — LACTATED RINGERS IV SOLN
INTRAVENOUS | Status: DC | PRN
  Administered 2019-02-03 (×2): via INTRAVENOUS

## 2019-02-03 MED ORDER — FENTANYL CITRATE (PF) 100 MCG/2ML IJ SOLN
25.0000 ug | INTRAMUSCULAR | Status: DC | PRN
  Administered 2019-02-03 (×3): 50 ug via INTRAVENOUS

## 2019-02-03 MED ORDER — MIDAZOLAM HCL 2 MG/2ML IJ SOLN
INTRAMUSCULAR | Status: AC
  Filled 2019-02-03: qty 2

## 2019-02-03 MED ORDER — LACTATED RINGERS IV SOLN
INTRAVENOUS | Status: AC
  Administered 2019-02-03: 09:00:00 via INTRAVENOUS

## 2019-02-03 MED ORDER — BUPIVACAINE-EPINEPHRINE 0.25% -1:200000 IJ SOLN
INTRAMUSCULAR | Status: DC | PRN
  Administered 2019-02-03: 20 mL

## 2019-02-03 MED ORDER — HYDROMORPHONE HCL 1 MG/ML IJ SOLN
INTRAMUSCULAR | Status: AC
  Filled 2019-02-03: qty 1

## 2019-02-03 MED ORDER — HYDROMORPHONE HCL 1 MG/ML IJ SOLN
0.2500 mg | INTRAMUSCULAR | Status: DC | PRN
  Administered 2019-02-03 (×2): 0.5 mg via INTRAVENOUS

## 2019-02-03 MED ORDER — SUGAMMADEX SODIUM 200 MG/2ML IV SOLN
INTRAVENOUS | Status: DC | PRN
  Administered 2019-02-03: 300 mg via INTRAVENOUS

## 2019-02-03 MED ORDER — ONDANSETRON HCL 4 MG/2ML IJ SOLN
INTRAMUSCULAR | Status: AC
  Filled 2019-02-03: qty 2

## 2019-02-03 MED ORDER — ENOXAPARIN SODIUM 40 MG/0.4ML ~~LOC~~ SOLN: 40.0000 mg | Freq: Every day | SUBCUTANEOUS | Status: DC

## 2019-02-03 MED ORDER — ONDANSETRON HCL 4 MG/2ML IJ SOLN: 4.0000 mg | Freq: Once | INTRAMUSCULAR | Status: DC | PRN

## 2019-02-03 MED ORDER — SUGAMMADEX SODIUM 500 MG/5ML IV SOLN
INTRAVENOUS | Status: AC
  Filled 2019-02-03: qty 5

## 2019-02-03 MED ORDER — VANCOMYCIN HCL 10 G IV SOLR
1500.0000 mg | Freq: Once | INTRAVENOUS | Status: AC
  Administered 2019-02-03: 1500 mg via INTRAVENOUS
  Filled 2019-02-03: qty 1500

## 2019-02-03 MED ORDER — 0.9 % SODIUM CHLORIDE (POUR BTL) OPTIME
TOPICAL | Status: DC | PRN
  Administered 2019-02-03: 3000 mL

## 2019-02-03 MED ORDER — SODIUM CHLORIDE 0.9 % IV SOLN
INTRAVENOUS | Status: DC | PRN
  Administered 2019-02-03: 50 ug/min via INTRAVENOUS

## 2019-02-03 MED ORDER — EPHEDRINE SULFATE 50 MG/ML IJ SOLN
INTRAMUSCULAR | Status: DC | PRN
  Administered 2019-02-03: 5 mg via INTRAVENOUS

## 2019-02-03 MED ORDER — FENTANYL CITRATE (PF) 100 MCG/2ML IJ SOLN
INTRAMUSCULAR | Status: AC
  Filled 2019-02-03: qty 2

## 2019-02-03 MED ORDER — DEXAMETHASONE SODIUM PHOSPHATE 10 MG/ML IJ SOLN
INTRAMUSCULAR | Status: DC | PRN
  Administered 2019-02-03: 10 mg via INTRAVENOUS

## 2019-02-03 MED ORDER — OXYCODONE HCL 5 MG/5ML PO SOLN: 5.0000 mg | Freq: Once | ORAL | Status: DC | PRN

## 2019-02-03 MED ORDER — OXYCODONE HCL 5 MG PO TABS: 5.0000 mg | ORAL_TABLET | Freq: Once | ORAL | Status: DC | PRN

## 2019-02-03 MED ORDER — FENTANYL CITRATE (PF) 250 MCG/5ML IJ SOLN
INTRAMUSCULAR | Status: DC | PRN
  Administered 2019-02-03: 50 ug via INTRAVENOUS
  Administered 2019-02-03: 100 ug via INTRAVENOUS
  Administered 2019-02-03 (×2): 50 ug via INTRAVENOUS

## 2019-02-03 MED ORDER — PROPOFOL 10 MG/ML IV BOLUS
INTRAVENOUS | Status: DC | PRN
  Administered 2019-02-03: 200 mg via INTRAVENOUS

## 2019-02-03 MED ORDER — MIDAZOLAM HCL 5 MG/5ML IJ SOLN
INTRAMUSCULAR | Status: DC | PRN
  Administered 2019-02-03: 2 mg via INTRAVENOUS

## 2019-02-03 MED ORDER — MORPHINE SULFATE (PF) 2 MG/ML IV SOLN
1.0000 mg | INTRAVENOUS | Status: DC | PRN
  Administered 2019-02-03: 2 mg via INTRAVENOUS
  Administered 2019-02-04 (×4): 4 mg via INTRAVENOUS
  Administered 2019-02-05: 2 mg via INTRAVENOUS
  Administered 2019-02-05: 4 mg via INTRAVENOUS
  Administered 2019-02-06 – 2019-02-08 (×7): 2 mg via INTRAVENOUS
  Filled 2019-02-03 (×2): qty 1
  Filled 2019-02-03: qty 2
  Filled 2019-02-03 (×7): qty 1
  Filled 2019-02-03 (×2): qty 2
  Filled 2019-02-03: qty 1
  Filled 2019-02-03: qty 2
  Filled 2019-02-03: qty 1

## 2019-02-03 MED ORDER — LIDOCAINE 2% (20 MG/ML) 5 ML SYRINGE
INTRAMUSCULAR | Status: DC | PRN
  Administered 2019-02-03: 80 mg via INTRAVENOUS

## 2019-02-03 MED ORDER — METHOCARBAMOL 1000 MG/10ML IJ SOLN
500.0000 mg | Freq: Four times a day (QID) | INTRAVENOUS | Status: DC | PRN
  Administered 2019-02-04 – 2019-02-06 (×2): 500 mg via INTRAVENOUS
  Filled 2019-02-03 (×2): qty 5
  Filled 2019-02-03: qty 500
  Filled 2019-02-03: qty 5

## 2019-02-03 MED ORDER — ONDANSETRON HCL 4 MG/2ML IJ SOLN
INTRAMUSCULAR | Status: DC | PRN
  Administered 2019-02-03: 4 mg via INTRAVENOUS

## 2019-02-03 MED ORDER — FENTANYL CITRATE (PF) 250 MCG/5ML IJ SOLN
INTRAMUSCULAR | Status: AC
  Filled 2019-02-03: qty 5

## 2019-02-03 MED ORDER — ROCURONIUM BROMIDE 10 MG/ML (PF) SYRINGE
PREFILLED_SYRINGE | INTRAVENOUS | Status: DC | PRN
  Administered 2019-02-03: 30 mg via INTRAVENOUS
  Administered 2019-02-03 (×2): 20 mg via INTRAVENOUS
  Administered 2019-02-03: 50 mg via INTRAVENOUS

## 2019-02-03 MED ORDER — NYSTATIN 100000 UNIT/GM EX POWD
Freq: Three times a day (TID) | CUTANEOUS | Status: DC
  Administered 2019-02-03 – 2019-02-04 (×2): via TOPICAL
  Administered 2019-02-04: 1 via TOPICAL
  Administered 2019-02-05 – 2019-02-11 (×6): via TOPICAL
  Filled 2019-02-03: qty 15

## 2019-02-03 MED ORDER — SUCCINYLCHOLINE CHLORIDE 20 MG/ML IJ SOLN
INTRAMUSCULAR | Status: DC | PRN
  Administered 2019-02-03: 120 mg via INTRAVENOUS

## 2019-02-03 SURGICAL SUPPLY — 65 items
APL PRP STRL LF DISP 70% ISPRP (MISCELLANEOUS) ×2
APL SKNCLS STERI-STRIP NONHPOA (GAUZE/BANDAGES/DRESSINGS) ×2
BENZOIN TINCTURE PRP APPL 2/3 (GAUZE/BANDAGES/DRESSINGS) ×5 IMPLANT
BINDER ABDOMINAL 12 ML 46-62 (SOFTGOODS) ×2 IMPLANT
BLADE CLIPPER SURG (BLADE) IMPLANT
CANISTER SUCT 3000ML PPV (MISCELLANEOUS) ×5 IMPLANT
CHLORAPREP W/TINT 26 (MISCELLANEOUS) ×1 IMPLANT
COVER SURGICAL LIGHT HANDLE (MISCELLANEOUS) ×6 IMPLANT
COVER WAND RF STERILE (DRAPES) ×5 IMPLANT
DRAIN CHANNEL 19F RND (DRAIN) ×1 IMPLANT
DRAPE LAPAROSCOPIC ABDOMINAL (DRAPES) ×5 IMPLANT
DRSG COVADERM 4X10 (GAUZE/BANDAGES/DRESSINGS) ×1 IMPLANT
DRSG TEGADERM 4X4.75 (GAUZE/BANDAGES/DRESSINGS) ×4 IMPLANT
ELECT BLADE 4.0 EZ CLEAN MEGAD (MISCELLANEOUS) ×3
ELECT CAUTERY BLADE 6.4 (BLADE) ×5 IMPLANT
ELECT REM PT RETURN 9FT ADLT (ELECTROSURGICAL) ×3
ELECTRODE BLDE 4.0 EZ CLN MEGD (MISCELLANEOUS) IMPLANT
ELECTRODE REM PT RTRN 9FT ADLT (ELECTROSURGICAL) ×4 IMPLANT
EVACUATOR SILICONE 100CC (DRAIN) ×1 IMPLANT
GAUZE SPONGE 4X4 12PLY STRL (GAUZE/BANDAGES/DRESSINGS) ×5 IMPLANT
GAUZE SPONGE 4X4 12PLY STRL LF (GAUZE/BANDAGES/DRESSINGS) ×1 IMPLANT
GLOVE BIO SURGEON STRL SZ 6 (GLOVE) ×1 IMPLANT
GLOVE BIO SURGEON STRL SZ7 (GLOVE) ×5 IMPLANT
GLOVE BIOGEL PI IND STRL 6.5 (GLOVE) IMPLANT
GLOVE BIOGEL PI IND STRL 7.0 (GLOVE) IMPLANT
GLOVE BIOGEL PI IND STRL 7.5 (GLOVE) ×4 IMPLANT
GLOVE BIOGEL PI INDICATOR 6.5 (GLOVE) ×1
GLOVE BIOGEL PI INDICATOR 7.0 (GLOVE) ×1
GLOVE BIOGEL PI INDICATOR 7.5 (GLOVE) ×2
GLOVE ECLIPSE 7.0 STRL STRAW (GLOVE) ×1 IMPLANT
GOWN STRL REUS W/ TWL LRG LVL3 (GOWN DISPOSABLE) ×8 IMPLANT
GOWN STRL REUS W/TWL LRG LVL3 (GOWN DISPOSABLE) ×6
HANDLE SUCTION POOLE (INSTRUMENTS) IMPLANT
KIT BASIN OR (CUSTOM PROCEDURE TRAY) ×5 IMPLANT
KIT TURNOVER KIT B (KITS) ×5 IMPLANT
LIGASURE IMPACT 36 18CM CVD LR (INSTRUMENTS) ×1 IMPLANT
NDL HYPO 25GX1X1/2 BEV (NEEDLE) ×4 IMPLANT
NEEDLE HYPO 25GX1X1/2 BEV (NEEDLE) ×3 IMPLANT
NS IRRIG 1000ML POUR BTL (IV SOLUTION) ×7 IMPLANT
PACK GENERAL/GYN (CUSTOM PROCEDURE TRAY) ×5 IMPLANT
PAD ARMBOARD 7.5X6 YLW CONV (MISCELLANEOUS) ×10 IMPLANT
PENCIL SMOKE EVACUATOR (MISCELLANEOUS) ×5 IMPLANT
PIN SAFETY STERILE (MISCELLANEOUS) ×1 IMPLANT
RELOAD PROXIMATE 75MM BLUE (ENDOMECHANICALS) ×6 IMPLANT
RELOAD STAPLE 75 3.8 BLU REG (ENDOMECHANICALS) IMPLANT
SPECIMEN JAR MEDIUM (MISCELLANEOUS) ×2 IMPLANT
STAPLER GUN LINEAR PROX 60 (STAPLE) ×1 IMPLANT
STAPLER PROXIMATE 75MM BLUE (STAPLE) ×1 IMPLANT
STAPLER VISISTAT 35W (STAPLE) ×1 IMPLANT
STRIP CLOSURE SKIN 1/2X4 (GAUZE/BANDAGES/DRESSINGS) ×4 IMPLANT
SUCTION POOLE HANDLE (INSTRUMENTS) ×3
SUT ETHILON 2 0 FS 18 (SUTURE) ×1 IMPLANT
SUT MNCRL AB 4-0 PS2 18 (SUTURE) ×5 IMPLANT
SUT NOVA 1 T20/GS 25DT (SUTURE) ×1 IMPLANT
SUT NOVA NAB GS-21 0 18 T12 DT (SUTURE) ×1 IMPLANT
SUT NOVA NAB GS-21 1 T12 (SUTURE) IMPLANT
SUT SILK 2 0 SH CR/8 (SUTURE) ×1 IMPLANT
SUT SILK 3 0 SH CR/8 (SUTURE) ×1 IMPLANT
SUT VIC AB 3-0 SH 27 (SUTURE) ×3
SUT VIC AB 3-0 SH 27XBRD (SUTURE) ×4 IMPLANT
SYR CONTROL 10ML LL (SYRINGE) ×5 IMPLANT
TAPE CLOTH SURG 4X10 WHT LF (GAUZE/BANDAGES/DRESSINGS) ×1 IMPLANT
TOWEL GREEN STERILE (TOWEL DISPOSABLE) ×5 IMPLANT
TOWEL GREEN STERILE FF (TOWEL DISPOSABLE) ×5 IMPLANT
TRAY FOLEY MTR SLVR 14FR STAT (SET/KITS/TRAYS/PACK) ×1 IMPLANT

## 2019-02-03 NOTE — Progress Notes (Signed)
Pt unable to wear cpap due to NG tube being in place.

## 2019-02-03 NOTE — Progress Notes (Signed)
Family Medicine Teaching Service Daily Progress Note Intern Pager: [phone]  Patient name: Kelsey Bullock Medical record number: [id number] Date of birth: 1961/01/10 Age: 58 y.o. Gender: female  Primary Care Provider: [name], MD Consultants: Surgery Code Status: Full  Pt Overview and Major Events to Date:  8/30-admitted to [facility] for small bowel obstruction  Assessment and Plan: Kelsey Bullock is a 58 y.o. female presenting with diffuse abdominal for 5 days. PMH is significant for  has a past medical history of Allergic rhinitis, Asthma, Complication of anesthesia, Congestive heart failure (CHF) ([place]), Depression, Gall stone, GERD (gastroesophageal reflux disease), and PONV (postoperative nausea and vomiting), hyperlipedemia, OSA, prediabetes, and obesity.   Small bowel obstruction Patient has had multiple surgeries in the past including.  Cyst removal 21 years ago, kidney stone removal which was converted to open due to scar tissue, C-section x2.  CT on admission showed large infraumbilical hernia containing small bowel loops causing SBO.  Surgery was consulted and NG tube placed.  Preoperative mortality predictor score-2.4%.  Surgery planning on operation today (8/31).  - P.o., NG tube to suction at this time -IV Zofran -IV fluids at the notes - Lovenox 40 mg - IV Tylenol for general pain and fentanyl for breakthrough pain - Operative exploration planned for today 8/31 with Dr. Georgette Dover  [id number] Diastolic HFpEF -Echo [id number] LVEF 60-65% -Appears to be not taking lasixs. Lasix 40mg  last perscribed 05/17/2018. -Does not appear to be fluid overloaded at this time.  GERD -Home meds include tums, pepcid, priolosec -Hold home meds for now. NPO  Asthma  -Continue albuterol nebulizer and dulera -Hold omalizumab, duoneb, atrovent, qvar  while NPO  Obesity She has been trying to lose weight over the past few months and reports that she is lost about 80 pounds  intentionally. -No recorded weight for this admission -f/u on wt. For BMI  Allergic rhinitis  -Continue flonase -Hold PO home meds zyrtec, singular, pataday  Depression - hold home meds for now Klonipin 1mg , Remeron   - CIWA scoring  OSA -H/H elevated -CPAP qhs  Chronic back pain -hold home med Fexeril 10mg  for now while NPO  FEN/GI: N.p.o. PPx: Lovenox 40 mg  Disposition: Pending surgery  Subjective:  Patient reports that she feels certainly better than last night.  She feels like her stomach pain is gone down but it is still painful.  She is getting ready to go to the OR for surgery.  Objective: Temp:  [98.3 F (36.8 C)-98.9 F (37.2 C)] 98.9 F (37.2 C) (08/31 0450) Pulse Rate:  [82-98] 98 (08/31 0601) Resp:  [16-22] 16 (08/31 0450) BP: (89-128)/(48-80) 93/51 (08/31 0601) SpO2:  [91 %-98 %] 92 % (08/31 0601) General: NAD, resting in bed being prepped for surgery.  Obese female HEENT: Atraumatic. Normocephalic.  Cardiac: RRR, no m/r/g Respiratory: CTAB, normal work of breathing Abdomen: Hard, tender to palpation with decreased bowel sounds Skin: warm and dry, no rashes noted Neuro: alert and oriented  Laboratory: Recent Labs  Lab 02/02/19 1810 02/02/19 1821 02/03/19 0135  WBC 16.0*  --  14.9*  HGB 15.8* 17.0* 14.7  HCT 48.5* 50.0* 44.7  PLT 290  --  233   Recent Labs  Lab 02/02/19 1810 02/02/19 1821 02/03/19 0135  NA 133* 136 133*  K 3.8 3.8 3.9  CL 97* 99 96*  CO2 21*  --  22  BUN 6 6 7   CREATININE 0.72 0.60 0.76  CALCIUM 9.5  --  9.2  PROT 8.1  --   --   BILITOT 1.9*  --   --   ALKPHOS 82  --   --   ALT 20  --   --   AST 18  --   --   GLUCOSE 116* 116* 148*   Hemoglobin A1c 6.1 Lactic acid-1.6  Imaging/Diagnostic Tests: Dg Abdomen 1 View  Result Date: 02/02/2019 CLINICAL DATA:  NG tube placement. EXAM: ABDOMEN - 1 VIEW COMPARISON:  None. FINDINGS: An NG tube is identified with tip overlying the distal stomach. IMPRESSION: NG tube  tip overlying the distal stomach. Electronically Signed   By: Margarette Canada M.D.   On: 02/02/2019 21:54   Ct Abdomen Pelvis W Contrast  Result Date: 02/02/2019 CLINICAL DATA:  58 year old female with acute abdominal and pelvic pain. EXAM: CT ABDOMEN AND PELVIS WITH CONTRAST TECHNIQUE: Multidetector CT imaging of the abdomen and pelvis was performed using the standard protocol following bolus administration of intravenous contrast. CONTRAST:  111mL OMNIPAQUE IOHEXOL 300 MG/ML  SOLN COMPARISON:  None. FINDINGS: Lower chest: No acute abnormality Hepatobiliary: The liver and gallbladder are unremarkable. No biliary dilatation. Pancreas: Unremarkable Spleen: Unremarkable Adrenals/Urinary Tract: Kidneys, adrenal glands and bladder are unremarkable except for a probable RIGHT renal cyst. Stomach/Bowel: A large infraumbilical hernia containing small bowel loops is identified. Dilated small bowel loops proximal to this hernia noted compatible with small bowel obstruction. There is some fluid within the terminal ileum and gas within the colon. No bowel wall thickening identified. Vascular/Lymphatic: Mild aortic atherosclerosis. No enlarged abdominal or pelvic lymph nodes. Reproductive: Uterus and bilateral adnexa are unremarkable except for probable small calcified fibroid. Other: Trace amount of free fluid within pelvis is noted. No evidence of abscess or pneumoperitoneum. Musculoskeletal: No acute or suspicious bony abnormalities. IMPRESSION: 1. Large infraumbilical hernia containing small bowel loops and causing small bowel obstruction. Trace amount of free pelvic fluid. No evidence of abscess or pneumoperitoneum. 2.  Aortic Atherosclerosis (ICD10-I70.0). Electronically Signed   By: Margarette Canada M.D.   On: 02/02/2019 20:04   Gifford Shave, MD 02/03/2019, 6:34 AM PGY-1, [place] Intern pager: [phone], text pages welcome

## 2019-02-03 NOTE — Op Note (Signed)
Preop diagnosis: #1 super morbid obesity #2 incarcerated ventral hernia with small bowel obstruction Postop diagnosis: Same Procedure performed: Exploratory laparotomy, small bowel resection, primary repair of ventral hernia  Surgeon:Enrrique Mierzwa K Ferol Laiche Assistant: Saverio Danker, PA-C Anesthesia: General Indications: This is a 58 year old female with a BMI of 60 who presents with an incarcerated infraumbilical ventral hernia with small bowel obstruction.  Apparently the patient had some type of laparoscopic procedure performed by GYN about 20 years ago.  This was converted to an open procedure.  She did not realize that she had a hernia until she came to the ER yesterday.  She has had a couple of days of distention, nausea, vomiting, and no bowel movements.  CT scan showed a large incarcerated umbilical hernia with small bowel obstruction.  The fascial defect appears relatively small.  She presents now for exploration.  Description of procedure: The patient is brought to the operating room and placed in the supine position on the operating room table.  A Foley catheter was placed under sterile technique.  The patient's abdomen and upper groin was prepped with ChloraPrep and draped in sterile fashion.  A timeout was taken to ensure the proper patient and proper procedure.  We made a vertical midline incision from the umbilicus down over the palpable hernia sac extending about 12 cm.  We dissected down through a considerable amount of adipose tissue to the surface of the hernia sac.  I bluntly dissected around the entire hernia sac down to the fascia.  We used a Presenter, broadcasting in the subcutaneous tissue to provide some exposure.  I then opened the hernia sac carefully.  We encountered a large amount of omentum that had some venous congestion but no sign of necrosis.  We then encountered a segment of small bowel that appeared ischemic.  We used the LigaSure device to resect a large amount of omentum from the  hernia sac.  We then were able to identify the edges of the fascial defect which seemed to be only about 4 cm across.  I enlarged it slightly to allow easy examination of the small bowel.  There is an obvious stricture in the small bowel and a segment about 15 cm long that appeared to be ischemic.  Even after releasing the tight fascia, the segment of bowel did not improve significantly.  Therefore I made the decision to resect small bowel.  We divided proximally distal to the ischemic segment with a GIA 75 stapler.  The LigaSure was used to resect the mesentery.  A side-to-side anastomosis was created with a GIA-75 stapler and a TA 60 stapler.  The distal segment of the small bowel appears to be relatively close to the ileocecal valve as we could identify what appeared to be the ligament of Treves.  However I did not exteriorize the cecum.  Once we finished our anastomosis I oversewed the staple line with interrupted 3 oh silks.  The anastomosis is widely patent.  We reduced the anastomosis back into the peritoneal cavity.  We then reapproximated the fascia using multiple interrupted #1 Novafil sutures.  This was done in a transverse fashion.  We irrigated the subcutaneous space.  Much of the hernia sac has been excised.  We inspected for hemostasis.  A 19 French drain was placed into the large amount of dead space where the hernia sac had previously resided.  This was secured with 2-0 nylon suture.  This was placed to bulb suction.  The skin was closed with staples  and a dry dressing was applied.  The patient was then extubated and brought to the recovery room in stable condition.  All sponge, instrument, and needle counts are correct.  Imogene Burn. Georgette Dover, MD, [place] [facility] Beeper [phone]  02/03/2019 11:47 AM

## 2019-02-03 NOTE — ED Notes (Signed)
Pt ambulated to restroom with no assistance. There were no issues in balance or gait noted.

## 2019-02-03 NOTE — ED Notes (Signed)
Report called to rn on 6 n 

## 2019-02-03 NOTE — Transfer of Care (Signed)
Immediate Anesthesia Transfer of Care Note  Patient: Kelsey Bullock  Procedure(s) Performed: OPEN REPAIR OF INCARCERATED VENTRAL HERNIA REPAIR AND SMALL BOWEL RESECTION (N/A Abdomen)  Patient Location: PACU  Anesthesia Type:General  Level of Consciousness: awake, alert  and oriented  Airway & Oxygen Therapy: Patient Spontanous Breathing and Patient connected to face mask oxygen  Post-op Assessment: Report given to RN, Post -op Vital signs reviewed and stable and Patient moving all extremities X 4  Post vital signs: Reviewed and stable  Last Vitals:  Vitals Value Taken Time  BP 112/59 02/03/19 1157  Temp    Pulse 87 02/03/19 1159  Resp 18 02/03/19 1159  SpO2 100 % 02/03/19 1159  Vitals shown include unvalidated device data.  Last Pain:  Vitals:   02/03/19 0450  TempSrc: Oral  PainSc:          Complications: No apparent anesthesia complications

## 2019-02-03 NOTE — Anesthesia Procedure Notes (Signed)
Procedure Name: Intubation Date/Time: 02/03/2019 9:48 AM Performed by: Mariea Clonts, CRNA Pre-anesthesia Checklist: Patient identified, Emergency Drugs available, Suction available and Patient being monitored Patient Re-evaluated:Patient Re-evaluated prior to induction Oxygen Delivery Method: Circle System Utilized Preoxygenation: Pre-oxygenation with 100% oxygen Induction Type: IV induction, Rapid sequence and Cricoid Pressure applied Laryngoscope Size: Miller and 2 Grade View: Grade I Tube type: Oral Tube size: 7.5 mm Number of attempts: 1 Airway Equipment and Method: Stylet and Oral airway Placement Confirmation: ETT inserted through vocal cords under direct vision,  positive ETCO2 and breath sounds checked- equal and bilateral Tube secured with: Tape Dental Injury: Teeth and Oropharynx as per pre-operative assessment

## 2019-02-03 NOTE — Progress Notes (Signed)
Subjective/Chief Complaint: Patient feels slightly better with NG tube, but no flatus or BM  Patient unsure of current weight, but based on last recorded weight, BMI 69.9   Objective: Vital signs in last 24 hours: Temp:  [98.3 F (36.8 C)-98.9 F (37.2 C)] 98.9 F (37.2 C) (08/31 0450) Pulse Rate:  [82-98] 87 (08/31 0723) Resp:  [16-22] 16 (08/31 0723) BP: (89-128)/(48-80) 93/51 (08/31 0601) SpO2:  [91 %-98 %] 92 % (08/31 0723) Last BM Date: 01/30/19  Intake/Output from previous day: 08/30 0701 - 08/31 0700 In: 277.8 [I.V.:277.8] Out: 350 [Emesis/NG output:350] Intake/Output this shift: No intake/output data recorded.  General appearance: alert, cooperative, no distress and NG tube in place  GI: massively obese, palpable VH below umbilicus - not reducible; mildly tender  Lab Results:  Recent Labs    02/02/19 1810 02/02/19 1821 02/03/19 0135  WBC 16.0*  --  14.9*  HGB 15.8* 17.0* 14.7  HCT 48.5* 50.0* 44.7  PLT 290  --  233   BMET Recent Labs    02/02/19 1810 02/02/19 1821 02/03/19 0135  NA 133* 136 133*  K 3.8 3.8 3.9  CL 97* 99 96*  CO2 21*  --  22  GLUCOSE 116* 116* 148*  BUN 6 6 7   CREATININE 0.72 0.60 0.76  CALCIUM 9.5  --  9.2   PT/INR No results for input(s): LABPROT, INR in the last 72 hours. ABG No results for input(s): PHART, HCO3 in the last 72 hours.  Invalid input(s): PCO2, PO2  Studies/Results: Dg Abdomen 1 View  Result Date: 02/02/2019 CLINICAL DATA:  NG tube placement. EXAM: ABDOMEN - 1 VIEW COMPARISON:  None. FINDINGS: An NG tube is identified with tip overlying the distal stomach. IMPRESSION: NG tube tip overlying the distal stomach. Electronically Signed   By: Margarette Canada M.D.   On: 02/02/2019 21:54   Ct Abdomen Pelvis W Contrast  Result Date: 02/02/2019 CLINICAL DATA:  58 year old female with acute abdominal and pelvic pain. EXAM: CT ABDOMEN AND PELVIS WITH CONTRAST TECHNIQUE: Multidetector CT imaging of the abdomen and  pelvis was performed using the standard protocol following bolus administration of intravenous contrast. CONTRAST:  161mL OMNIPAQUE IOHEXOL 300 MG/ML  SOLN COMPARISON:  None. FINDINGS: Lower chest: No acute abnormality Hepatobiliary: The liver and gallbladder are unremarkable. No biliary dilatation. Pancreas: Unremarkable Spleen: Unremarkable Adrenals/Urinary Tract: Kidneys, adrenal glands and bladder are unremarkable except for a probable RIGHT renal cyst. Stomach/Bowel: A large infraumbilical hernia containing small bowel loops is identified. Dilated small bowel loops proximal to this hernia noted compatible with small bowel obstruction. There is some fluid within the terminal ileum and gas within the colon. No bowel wall thickening identified. Vascular/Lymphatic: Mild aortic atherosclerosis. No enlarged abdominal or pelvic lymph nodes. Reproductive: Uterus and bilateral adnexa are unremarkable except for probable small calcified fibroid. Other: Trace amount of free fluid within pelvis is noted. No evidence of abscess or pneumoperitoneum. Musculoskeletal: No acute or suspicious bony abnormalities. IMPRESSION: 1. Large infraumbilical hernia containing small bowel loops and causing small bowel obstruction. Trace amount of free pelvic fluid. No evidence of abscess or pneumoperitoneum. 2.  Aortic Atherosclerosis (ICD10-I70.0). Electronically Signed   By: Margarette Canada M.D.   On: 02/02/2019 20:04    Anti-infectives: Anti-infectives (From admission, onward)   Start     Dose/Rate Route Frequency Ordered Stop   02/03/19 0815  vancomycin (VANCOCIN) 1,500 mg in sodium chloride 0.9 % 500 mL IVPB     1,500 mg 250 mL/hr over 120  Minutes Intravenous  Once 02/03/19 0803        Assessment/Plan: Incarcerated ventral hernia Small bowel obstruction due to incarcerated ventral hernia Severe obesity Hypertension Hyperlipidemia PreDiabetes Obstructive sleep apnea  Plan open repair of incarcerated ventral hernia with  mesh. No sign of strangulated bowel.  Patient is at higher risk for wound complications and recurrence due to her morbid obesity.  The surgical procedure has been discussed with the patient.  Potential risks, benefits, alternative treatments, and expected outcomes have been explained.  All of the patient's questions at this time have been answered.  The likelihood of reaching the patient's treatment goal is good.  The patient understand the proposed surgical procedure and wishes to proceed.   LOS: 1 day    Kelsey Bullock 02/03/2019

## 2019-02-03 NOTE — Progress Notes (Signed)
Received patient from ED with NGT at right nare, AOx4, ambulatory, VSS, denies pain, oriented to room, bed controls, call light and explained plan of care.  Patient now resting on bed.  Will monitor.

## 2019-02-03 NOTE — Progress Notes (Signed)
Attempted to contact pharmacy tech for a med reconciliation but no answer.

## 2019-02-03 NOTE — Anesthesia Preprocedure Evaluation (Addendum)
Anesthesia Evaluation  Patient identified by MRN, date of birth, ID band Patient awake    Reviewed: Allergy & Precautions, NPO status , Patient's Chart, lab work & pertinent test results  History of Anesthesia Complications (+) PONVNegative for: history of anesthetic complications  Airway Mallampati: II  TM Distance: >3 FB Neck ROM: Full    Dental  (+) Teeth Intact   Pulmonary asthma , sleep apnea ,    Pulmonary exam normal        Cardiovascular hypertension, Pt. on medications +CHF (diastolic)  Normal cardiovascular exam     Neuro/Psych PSYCHIATRIC DISORDERS Anxiety Depression negative neurological ROS     GI/Hepatic Neg liver ROS, GERD  ,  Endo/Other  diabetes, Type 2Morbid obesity  Renal/GU negative Renal ROS  negative genitourinary   Musculoskeletal  (+) Fibromyalgia -  Abdominal (+) + obese,   Peds  Hematology negative hematology ROS (+)   Anesthesia Other Findings   Reproductive/Obstetrics                           Anesthesia Physical Anesthesia Plan  ASA: III  Anesthesia Plan: General   Post-op Pain Management:    Induction: Intravenous, Rapid sequence and Cricoid pressure planned  PONV Risk Score and Plan: 4 or greater and Ondansetron, Dexamethasone, Treatment may vary due to age or medical condition and Midazolam  Airway Management Planned: Oral ETT  Additional Equipment: None  Intra-op Plan:   Post-operative Plan: Extubation in OR  Informed Consent: I have reviewed the patients History and Physical, chart, labs and discussed the procedure including the risks, benefits and alternatives for the proposed anesthesia with the patient or authorized representative who has indicated his/her understanding and acceptance.     Dental advisory given  Plan Discussed with:   Anesthesia Plan Comments:        Anesthesia Quick Evaluation

## 2019-02-04 ENCOUNTER — Encounter (HOSPITAL_COMMUNITY): Payer: Self-pay | Admitting: Surgery

## 2019-02-04 MED ORDER — ENOXAPARIN SODIUM 80 MG/0.8ML ~~LOC~~ SOLN
0.5000 mg/kg | Freq: Every day | SUBCUTANEOUS | Status: DC
  Administered 2019-02-04 – 2019-02-11 (×8): 80 mg via SUBCUTANEOUS
  Filled 2019-02-04 (×8): qty 0.8

## 2019-02-04 MED ORDER — LACTATED RINGERS IV SOLN
INTRAVENOUS | Status: AC
  Administered 2019-02-04 – 2019-02-05 (×3): via INTRAVENOUS

## 2019-02-04 NOTE — Progress Notes (Signed)
Patient unable to wear CPAP due to NG tube in place.

## 2019-02-04 NOTE — Progress Notes (Signed)
Called by RN that patient is requesting nebulizer.Upon talking to patient she states that she gets congested and sometimes makes it hard to breath. Given treatment but will talk to RN about secretion medication for future.

## 2019-02-04 NOTE — Progress Notes (Signed)
Family Medicine Teaching Service Daily Progress Note Intern Pager: [phone]  Patient name: Kelsey Bullock Medical record number: [id number] Date of birth: 09-24-1960 Age: 58 y.o. Gender: female  Primary Care Provider: [name], MD Consultants: Surgery Code Status: Full  Pt Overview and Major Events to Date:  8/30-admitted to FPTS for small bowel obstruction   Assessment and Plan: Kelsey Bullock is a 58 y.o. female with hx of asthma, obesity, allergic rhinitis, CHF, GERD, gall stones and depression admitted with abdomin pain for past 6 days s/p small bowel resection.  POD 1.   Small bowel obstruction  Complains of abdominal pain.  Has been walking around. Has not having flatus. Some nausea but no vomiting.  Patient with hx of multiple surgeries in the past (cyst removal (21 yr ago), kidney stone removal and two C-sections. General surgery following.  NG tube output 700 mL.  JP drain serosanguineous fluid.  Admission CT showed large intra-abdominal call hernia containing small bowel loops causing SBO.  Patient had surgery yesterday.  Postop day 1.  -Continue NPO, ice chips -Continue NG tube  -IV Zofran -IV fluids  -Foley catheter to be removed today - Lovenox 80 mg - Morphine PRN for pain   [id number] Diastolic HFpEF -Echo [id number] LVEF 60-65% -No home Lasix although 40 mg prescribed  -Does not appear to be fluid overloaded at this time.  GERD -Home meds include tums, pepcid, priolosec -Hold home meds for now. NPO  Asthma  -Continue albuterol nebulizer and dulera -Hold omalizumab, duoneb, atrovent, qvar  while NPO  Obesity She has been trying to lose weight over the past few months and reports that she is lost about 80 pounds intentionally. -No recorded weight for this admission  - weight: 164.6kg, BMI 62.3    Allergic rhinitis  -Continue flonase -Hold PO home meds zyrtec, singular, pataday  Depression - hold home meds for now Klonipin 1mg , Remeron    - CIWA scoring  OSA -H/H elevated -CPAP qhs  Chronic back pain -hold home med Fexeril 10mg  for now while NPO  FEN/GI: NPO PPx: Lovenox 80 mg  Disposition: Pending medical clearance,  Likely d/c in care of pt's sister   Subjective:  Kelsey Bullock reports abdominal pain, SOB and nausea.  Denies vomiting, flatus and chest pain.   Objective: Temp:  [97.7 F (36.5 C)-98.5 F (36.9 C)] 98.4 F (36.9 C) (09/01 0405) Pulse Rate:  [67-92] 92 (09/01 0405) Resp:  [14-26] 18 (09/01 0405) BP: (107-124)/(59-69) 112/60 (09/01 0405) SpO2:  [92 %-100 %] 96 % (09/01 0405) Weight:  [164.6 kg] 164.6 kg (08/31 1645)    GEN: sitting upright in chair, in no acute distress  CV: regular rate and rhythm, no murmurs appreciated  RESP: no increased work of breathing, clear to ascultation bilaterally with no crackles, wheezes, or rhonchi, speaking in full sentences without pause ABD: Bowel sounds present. JP drain in place, surgical dressing without saturation SKIN: warm, dry NEURO: grossly normal, moves all extremities appropriately    Laboratory: Recent Labs  Lab 02/02/19 1810 02/02/19 1821 02/03/19 0135  WBC 16.0*  --  14.9*  HGB 15.8* 17.0* 14.7  HCT 48.5* 50.0* 44.7  PLT 290  --  233   Recent Labs  Lab 02/02/19 1810 02/02/19 1821 02/03/19 0135  NA 133* 136 133*  K 3.8 3.8 3.9  CL 97* 99 96*  CO2 21*  --  22  BUN 6 6 7   CREATININE 0.72 0.60 0.76  CALCIUM 9.5  --  9.2  PROT 8.1  --   --   BILITOT 1.9*  --   --   ALKPHOS 82  --   --   ALT 20  --   --   AST 18  --   --   GLUCOSE 116* 116* 148*   Hemoglobin A1c 6.1 Lactic acid-1.6  Imaging/Diagnostic Tests: Dg Abdomen 1 View  Result Date: 02/02/2019 CLINICAL DATA:  NG tube placement. EXAM: ABDOMEN - 1 VIEW COMPARISON:  None. FINDINGS: An NG tube is identified with tip overlying the distal stomach. IMPRESSION: NG tube tip overlying the distal stomach. Electronically Signed   By: Margarette Canada M.D.   On: 02/02/2019  21:54   Ct Abdomen Pelvis W Contrast  Result Date: 02/02/2019 CLINICAL DATA:  58 year old female with acute abdominal and pelvic pain. EXAM: CT ABDOMEN AND PELVIS WITH CONTRAST TECHNIQUE: Multidetector CT imaging of the abdomen and pelvis was performed using the standard protocol following bolus administration of intravenous contrast. CONTRAST:  146mL OMNIPAQUE IOHEXOL 300 MG/ML  SOLN COMPARISON:  None. FINDINGS: Lower chest: No acute abnormality Hepatobiliary: The liver and gallbladder are unremarkable. No biliary dilatation. Pancreas: Unremarkable Spleen: Unremarkable Adrenals/Urinary Tract: Kidneys, adrenal glands and bladder are unremarkable except for a probable RIGHT renal cyst. Stomach/Bowel: A large infraumbilical hernia containing small bowel loops is identified. Dilated small bowel loops proximal to this hernia noted compatible with small bowel obstruction. There is some fluid within the terminal ileum and gas within the colon. No bowel wall thickening identified. Vascular/Lymphatic: Mild aortic atherosclerosis. No enlarged abdominal or pelvic lymph nodes. Reproductive: Uterus and bilateral adnexa are unremarkable except for probable small calcified fibroid. Other: Trace amount of free fluid within pelvis is noted. No evidence of abscess or pneumoperitoneum. Musculoskeletal: No acute or suspicious bony abnormalities. IMPRESSION: 1. Large infraumbilical hernia containing small bowel loops and causing small bowel obstruction. Trace amount of free pelvic fluid. No evidence of abscess or pneumoperitoneum. 2.  Aortic Atherosclerosis (ICD10-I70.0). Electronically Signed   By: Margarette Canada M.D.   On: 02/02/2019 20:04   Lyndee Hensen, MD 02/04/2019, 7:48 AM PGY-1, [facility] Intern pager: [phone], text pages welcome

## 2019-02-04 NOTE — Progress Notes (Signed)
Pt requesting nebulizer treatment, called Respiratory.

## 2019-02-04 NOTE — Progress Notes (Signed)
Patient ID: EBELIN ANIOL, female   DOB: [birth year], 58 y.o.   MRN: [id number]    1 Day Post-Op  Subjective: Having some pain this morning.  No flatus yet, but has heard some rumbling.    Objective: Vital signs in last 24 hours: Temp:  [97.7 F (36.5 C)-98.5 F (36.9 C)] 98.4 F (36.9 C) (09/01 0405) Pulse Rate:  [67-92] 92 (09/01 0405) Resp:  [14-26] 18 (09/01 0405) BP: (107-124)/(59-69) 112/60 (09/01 0405) SpO2:  [92 %-100 %] 96 % (09/01 0405) Weight:  [164.6 kg] 164.6 kg (08/31 1645) Last BM Date: 01/30/19  Intake/Output from previous day: 08/31 0701 - 09/01 0700 In: 2337 [I.V.:2337] Out: 1467.5 [Urine:705; Emesis/NG output:700; Drains:37.5; Blood:25] Intake/Output this shift: No intake/output data recorded.  PE: Abd: morbidly obese, soft, appropriately tender, incision is clean and covered with no drainage on the bandage.  JP drain in place with some serosang output.  Hypoactive BS heard. GU: foley in place, 700cc of urine documented  Lab Results:  Recent Labs    02/02/19 1810 02/02/19 1821 02/03/19 0135  WBC 16.0*  --  14.9*  HGB 15.8* 17.0* 14.7  HCT 48.5* 50.0* 44.7  PLT 290  --  233   BMET Recent Labs    02/02/19 1810 02/02/19 1821 02/03/19 0135  NA 133* 136 133*  K 3.8 3.8 3.9  CL 97* 99 96*  CO2 21*  --  22  GLUCOSE 116* 116* 148*  BUN 6 6 7   CREATININE 0.72 0.60 0.76  CALCIUM 9.5  --  9.2   PT/INR No results for input(s): LABPROT, INR in the last 72 hours. CMP     Component Value Date/Time   NA 133 (L) 02/03/2019 0135   NA 143 01/18/2017 1028   K 3.9 02/03/2019 0135   CL 96 (L) 02/03/2019 0135   CO2 22 02/03/2019 0135   GLUCOSE 148 (H) 02/03/2019 0135   BUN 7 02/03/2019 0135   BUN 11 01/18/2017 1028   CREATININE 0.76 02/03/2019 0135   CREATININE 0.64 08/26/2015 0900   CALCIUM 9.2 02/03/2019 0135   PROT 8.1 02/02/2019 1810   PROT 7.2 10/16/2016 1006   ALBUMIN 4.0 02/02/2019 1810   ALBUMIN 4.5 10/16/2016 1006   AST 18 02/02/2019  1810   ALT 20 02/02/2019 1810   ALKPHOS 82 02/02/2019 1810   BILITOT 1.9 (H) 02/02/2019 1810   BILITOT 0.4 10/16/2016 1006   GFRNONAA >60 02/03/2019 0135   GFRNONAA >89 08/26/2015 0900   GFRAA >60 02/03/2019 0135   GFRAA >89 08/26/2015 0900   Lipase     Component Value Date/Time   LIPASE 22 02/02/2019 1810       Studies/Results: Dg Abdomen 1 View  Result Date: 02/02/2019 CLINICAL DATA:  NG tube placement. EXAM: ABDOMEN - 1 VIEW COMPARISON:  None. FINDINGS: An NG tube is identified with tip overlying the distal stomach. IMPRESSION: NG tube tip overlying the distal stomach. Electronically Signed   By: Margarette Canada M.D.   On: 02/02/2019 21:54   Ct Abdomen Pelvis W Contrast  Result Date: 02/02/2019 CLINICAL DATA:  58 year old female with acute abdominal and pelvic pain. EXAM: CT ABDOMEN AND PELVIS WITH CONTRAST TECHNIQUE: Multidetector CT imaging of the abdomen and pelvis was performed using the standard protocol following bolus administration of intravenous contrast. CONTRAST:  172mL OMNIPAQUE IOHEXOL 300 MG/ML  SOLN COMPARISON:  None. FINDINGS: Lower chest: No acute abnormality Hepatobiliary: The liver and gallbladder are unremarkable. No biliary dilatation. Pancreas: Unremarkable Spleen: Unremarkable Adrenals/Urinary Tract:  Kidneys, adrenal glands and bladder are unremarkable except for a probable RIGHT renal cyst. Stomach/Bowel: A large infraumbilical hernia containing small bowel loops is identified. Dilated small bowel loops proximal to this hernia noted compatible with small bowel obstruction. There is some fluid within the terminal ileum and gas within the colon. No bowel wall thickening identified. Vascular/Lymphatic: Mild aortic atherosclerosis. No enlarged abdominal or pelvic lymph nodes. Reproductive: Uterus and bilateral adnexa are unremarkable except for probable small calcified fibroid. Other: Trace amount of free fluid within pelvis is noted. No evidence of abscess or  pneumoperitoneum. Musculoskeletal: No acute or suspicious bony abnormalities. IMPRESSION: 1. Large infraumbilical hernia containing small bowel loops and causing small bowel obstruction. Trace amount of free pelvic fluid. No evidence of abscess or pneumoperitoneum. 2.  Aortic Atherosclerosis (ICD10-I70.0). Electronically Signed   By: Margarette Canada M.D.   On: 02/02/2019 20:04    Anti-infectives: Anti-infectives (From admission, onward)   Start     Dose/Rate Route Frequency Ordered Stop   02/03/19 0900  vancomycin (VANCOCIN) 1,500 mg in sodium chloride 0.9 % 500 mL IVPB     1,500 mg 250 mL/hr over 120 Minutes Intravenous  Once 02/03/19 0803 02/03/19 1048       Assessment/Plan Severe obesity Hypertension Hyperlipidemia PreDiabetes Obstructive sleep apnea  POD 1, s/p open primary ventral hernia repair with SBR for ischemic bowel, Dr. Georgette Dover 8/31 -DC foley today -cont NGT and await bowel function -mobilize, PT consult pending -IS and pulm toilet -cont JP drain  FEN - NPO/IVFs/NGT VTE - Lovenox, SCDs ID - vanc pre-op, none currently   LOS: 2 days    Henreitta Cea , [facility] Surgery 02/04/2019, 8:05 AM Pager: [phone]

## 2019-02-04 NOTE — Progress Notes (Signed)
Interdry AG applied to folds:  Under Breasts, Groins and Panis.

## 2019-02-04 NOTE — Evaluation (Signed)
Physical Therapy Evaluation Patient Details Name: Kelsey Bullock MRN: [id number] DOB: [birth year] Today's Date: 02/04/2019   History of Present Illness  Pt is a 58 y.o. female admitted 02/02/19 with diffuse abdominal pain. Pt with SBO herniated through an infraumbilical fascial defect. S/p exlap with small bowel resection and repair of ventral hernia. PMH includes CHF, asthma, depression, morbid obesity.    Clinical Impression  Pt presents with an overall decrease in functional mobility secondary to above. PTA, pt independent with intermittent use of SPC, lives with son; plans to have assist from sister upon initial return home. Educ on abdominal precautions for comfort, positioning, bracing, and importance of mobility. Today, pt able to ambulate throughout room with IV pole and min guard. Encouraged continued ambulation with assist from nursing staff for lines. Pt would benefit from continued acute PT services to maximize functional mobility and independence prior to d/c with HHPT services.     Follow Up Recommendations Home health PT;Supervision - Intermittent    Equipment Recommendations  None recommended by PT    Recommendations for Other Services       Precautions / Restrictions Precautions Precautions: Fall;Other (comment) Precaution Comments: NGT, abdominal for comfort Restrictions Weight Bearing Restrictions: No      Mobility  Bed Mobility Overal bed mobility: Needs Assistance Bed Mobility: Supine to Sit     Supine to sit: Min assist     General bed mobility comments: MinA for UE support to assist trunk elevation, HOB minimally elevated; limited by abdominal pain  Transfers Overall transfer level: Needs assistance Equipment used: 1 person hand held assist Transfers: Sit to/from Stand Sit to Stand: Min assist         General transfer comment: MinA for UE support to assist trunk elevation; abdominal pain with anterior weight  translation  Ambulation/Gait Ambulation/Gait assistance: Min guard Gait Distance (Feet): 60 Feet Assistive device: IV Pole;None Gait Pattern/deviations: Step-through pattern;Decreased stride length;Wide base of support Gait velocity: Decreased Gait velocity interpretation: <1.31 ft/sec, indicative of household ambulator General Gait Details: Slow, guarded gait with min guard, pt initially reaching to furniture for UE support, able to push IV pole in room. Further distance limited by c/o fatigue and bilateral knees "locking up"  Stairs            Wheelchair Mobility    Modified Rankin (Stroke Patients Only)       Balance Overall balance assessment: Needs assistance   Sitting balance-Leahy Scale: Fair       Standing balance-Leahy Scale: Fair                               Pertinent Vitals/Pain Pain Assessment: Faces Faces Pain Scale: Hurts little more Pain Location: Abdomen Pain Descriptors / Indicators: Sore    Home Living Family/patient expects to be discharged to:: Private residence Living Arrangements: Children Available Help at Discharge: Family;Available PRN/intermittently Type of Home: Apartment Home Access: Stairs to enter   Entrance Stairs-Number of Steps: 1 Home Layout: One level Home Equipment: Cane - single point;Walker - 2 wheels Additional Comments: Lives with 56 y.o. son. Reports sister will likely stay with her a few days at d/c to assist    Prior Function Level of Independence: Independent with assistive device(s)         Comments: Typically ambulates without device; keeps SPC in car for "when it rains;" chronic bilateral knee arthritis that "lock up." Uses scooter at grocery store. Denies falls  Hand Dominance        Extremity/Trunk Assessment   Upper Extremity Assessment Upper Extremity Assessment: Overall WFL for tasks assessed    Lower Extremity Assessment Lower Extremity Assessment: Generalized weakness        Communication   Communication: No difficulties  Cognition Arousal/Alertness: Awake/alert Behavior During Therapy: WFL for tasks assessed/performed Overall Cognitive Status: Within Functional Limits for tasks assessed                                        General Comments General comments (skin integrity, edema, etc.): Discussed abdominal precautions. Pt states difficulty getting into/out of tub; recommended tub bench but pt states likely no DME will fit in tub    Exercises     Assessment/Plan    PT Assessment Patient needs continued PT services  PT Problem List Decreased strength;Decreased activity tolerance;Decreased balance;Decreased mobility;Decreased knowledge of use of DME;Pain       PT Treatment Interventions DME instruction;Gait training;Stair training;Functional mobility training;Therapeutic activities;Therapeutic exercise;Balance training;Patient/family education    PT Goals (Current goals can be found in the Care Plan section)  Acute Rehab PT Goals Patient Stated Goal: Decreased pain PT Goal Formulation: With patient Time For Goal Achievement: 02/18/19 Potential to Achieve Goals: Good    Frequency Min 3X/week   Barriers to discharge        Co-evaluation               AM-PAC PT "6 Clicks" Mobility  Outcome Measure Help needed turning from your back to your side while in a flat bed without using bedrails?: A Little Help needed moving from lying on your back to sitting on the side of a flat bed without using bedrails?: A Little Help needed moving to and from a bed to a chair (including a wheelchair)?: A Little Help needed standing up from a chair using your arms (e.g., wheelchair or bedside chair)?: A Little Help needed to walk in hospital room?: A Little Help needed climbing 3-5 steps with a railing? : A Little 6 Click Score: 18    End of Session   Activity Tolerance: Patient tolerated treatment well Patient left: in chair;with call  bell/phone within reach;with nursing/sitter in room Nurse Communication: Mobility status PT Visit Diagnosis: Other abnormalities of gait and mobility (R26.89);Pain    Time: [id number] PT Time Calculation (min) (ACUTE ONLY): 25 min   Charges:   PT Evaluation $PT Eval Moderate Complexity: 1 Mod PT Treatments $Gait Training: 8-22 mins   Mabeline Caras, PT, DPT Acute Rehabilitation Services  Pager [phone] Office [place] 02/04/2019, 9:27 AM

## 2019-02-04 NOTE — Progress Notes (Signed)
Family Medicine Teaching Service Attending Brief Progress Note  S: Patient seen and examined. Patient reports doing well. Had some pain in bottom while sitting in chair, better now laying in bed.  O: BP (!) 107/51 (BP Location: Left Arm)   Pulse 80   Temp 98.7 F (37.1 C) (Oral)   Resp 18   Ht 5\' 4"  (1.626 m)   Wt (!) 164.6 kg   SpO2 92%   BMI 62.29 kg/m   Gen: no acute distress, pleasant cooperative Resp: normal work of breathing  Abdomen; obese. Binder in place. Appropriately tender to palpation  A/P:  SBO secondary to incarcerated hernia, s/p bowel resection - postoperative care as per general surgery. Medical problems are stable.  Will cosign resident note when it is available.  Chrisandra Netters, MD [place]

## 2019-02-05 LAB — BASIC METABOLIC PANEL
Anion gap: 13 (ref 5–15)
BUN: 11 mg/dL (ref 6–20)
CO2: 28 mmol/L (ref 22–32)
Calcium: 8.7 mg/dL — ABNORMAL LOW (ref 8.9–10.3)
Chloride: 99 mmol/L (ref 98–111)
Creatinine, Ser: 0.82 mg/dL (ref 0.44–1.00)
GFR calc Af Amer: >60 mL/min (ref >60)
GFR calc non Af Amer: >60 mL/min (ref >60)
Glucose, Bld: 103 mg/dL — ABNORMAL HIGH (ref 70–99)
Potassium: 3.6 mmol/L (ref 3.5–5.1)
Sodium: 140 mmol/L (ref 135–145)

## 2019-02-05 LAB — CBC
HCT: 40.7 % (ref 36.0–46.0)
Hemoglobin: 13 g/dL (ref 12.0–15.0)
MCH: 28.8 pg (ref 26.0–34.0)
MCHC: 31.9 g/dL (ref 30.0–36.0)
MCV: 90.2 fL (ref 80.0–100.0)
Platelets: 222 10*3/uL (ref 150–400)
RBC: 4.51 MIL/uL (ref 3.87–5.11)
RDW: 14.4 % (ref 11.5–15.5)
WBC: 12.6 10*3/uL — ABNORMAL HIGH (ref 4.0–10.5)
nRBC: 0 % (ref 0.0–0.2)

## 2019-02-05 MED ORDER — PHENOL 1.4 % MT LIQD
1.0000 | OROMUCOSAL | Status: DC | PRN
  Administered 2019-02-05: 1 via OROMUCOSAL
  Filled 2019-02-05: qty 177

## 2019-02-05 MED ORDER — OXYMETAZOLINE HCL 0.05 % NA SOLN
1.0000 | Freq: Two times a day (BID) | NASAL | Status: DC
  Administered 2019-02-05 – 2019-02-06 (×2): 1 via NASAL
  Filled 2019-02-05: qty 30

## 2019-02-05 MED ORDER — MENTHOL 3 MG MT LOZG
1.0000 | LOZENGE | OROMUCOSAL | Status: DC | PRN
  Filled 2019-02-05: qty 9

## 2019-02-05 MED ORDER — LACTATED RINGERS IV SOLN
INTRAVENOUS | Status: DC
  Administered 2019-02-05: 15:00:00 via INTRAVENOUS

## 2019-02-05 MED ORDER — SODIUM CHLORIDE 0.9 % IV SOLN: INTRAVENOUS | Status: DC

## 2019-02-05 NOTE — Progress Notes (Signed)
Pt not using CPAP at this time due to NG tube.

## 2019-02-05 NOTE — Progress Notes (Signed)
Family Medicine Teaching Service Daily Progress Note Intern Pager: [phone]  Patient name: Kelsey Bullock Medical record number: [id number] Date of birth: 28-Aug-1960 Age: 58 y.o. Gender: female  Primary Care Provider: [name], MD Consultants: Surgery Code Status: Full  Pt Overview and Major Events to Date:  8/30-admitted to FPTS for small bowel obstruction   Assessment and Plan: Kelsey Bullock is a 58 y.o. female with hx of asthma, obesity, allergic rhinitis, CHF, GERD, gall stones and depression admitted with abdomin pain for past 6 days s/p small bowel resection.   Small bowel obstruction  Postop day 2 of 15 cm small bowel resection performed on August 31st. continues to complain of abdominal pain.  Has been walking to and from the bathroom.  Has not had flatulence.  Nausea and managed with Zofran.  NG tube in place coffee-ground output 1690 mL.  Patient with hx of multiple surgeries in the past (cyst removal (21 yr ago), kidney stone removal and two C-sections. General surgery following.  JP drain serosanguineous fluid.  Admission CT showed large intra-abdominal call hernia containing small bowel loops causing SBO.  -Continue NPO, ice chips -Continue NG tube until bowel function returns  -IV Zofran -IV fluids  -Foley catheter to be removed - Lovenox 80 mg  - Morphine PRN for pain   [id number] Diastolic HFpEF -Echo [id number] LVEF 60-65% -No home Lasix although 40 mg prescribed  -Does not appear to be fluid overloaded at this time.  GERD -Home meds include tums, pepcid, priolosec -Hold home meds for now. NPO  Asthma  -Continue albuterol nebulizer and dulera -Hold omalizumab, duoneb, atrovent, qvar  while NPO  Obesity She has been trying to lose weight over the past few months and reports that she is lost about 80 pounds intentionally. -No recorded weight for this admission  - weight: 164.6kg, BMI 62.3    Allergic rhinitis  -Continue flonase -Hold PO  home meds zyrtec, singular, pataday  Depression - hold home meds for now Klonipin 1mg , Remeron   - CIWA scoring  OSA -H/H elevated -Patient unable to wear CPAP due to NG tube  Chronic back pain -hold home med Fexeril 10mg  for now while NPO  FEN/GI: NPO PPx: Lovenox 80 mg  Disposition: Pending surgical clearance,  Likely d/c in care of pt's sister   Subjective:  Kelsey Bullock reports ongoing abdominal pain that improves with morphine.  Patient has not had flatulence or any stools.  NG tube in place.  Remains NPO, eating ice chips.  States she is not hungry  Objective: Temp:  [98.2 F (36.8 C)-98.7 F (37.1 C)] 98.6 F (37 C) (09/02 0555) Pulse Rate:  [80-110] 87 (09/02 0844) Resp:  [16-18] 16 (09/02 0844) BP: (107-117)/(45-74) 108/45 (09/02 0555) SpO2:  [91 %-95 %] 92 % (09/02 0844)    GEN: sitting upright in chair with feet up, eating ice chips, in no acute distress  CV: regular rate and rhythm, no murmurs appreciated  RESP: no increased work of breathing, clear to auscultation bilaterally with no crackles, wheezes, or rhonchi, speaking in full sentences ABD: Scattered bowel sounds present, JP drain with serosanguineous fluid, surgical dressing without saturation, abdomen tender SKIN: warm, dry,  NEURO: grossly normal, moves all extremities appropriately     Laboratory: Recent Labs  Lab 02/02/19 1810 02/02/19 1821 02/03/19 0135  WBC 16.0*  --  14.9*  HGB 15.8* 17.0* 14.7  HCT 48.5* 50.0* 44.7  PLT 290  --  233   Recent Labs  Lab 02/02/19 1810 02/02/19 1821 02/03/19 0135  NA 133* 136 133*  K 3.8 3.8 3.9  CL 97* 99 96*  CO2 21*  --  22  BUN 6 6 7   CREATININE 0.72 0.60 0.76  CALCIUM 9.5  --  9.2  PROT 8.1  --   --   BILITOT 1.9*  --   --   ALKPHOS 82  --   --   ALT 20  --   --   AST 18  --   --   GLUCOSE 116* 116* 148*   Hemoglobin A1c 6.1 Lactic acid-1.6  Imaging/Diagnostic Tests: Dg Abdomen 1 View  Result Date: 02/02/2019 CLINICAL  DATA:  NG tube placement. EXAM: ABDOMEN - 1 VIEW COMPARISON:  None. FINDINGS: An NG tube is identified with tip overlying the distal stomach. IMPRESSION: NG tube tip overlying the distal stomach. Electronically Signed   By: Margarette Canada M.D.   On: 02/02/2019 21:54   Ct Abdomen Pelvis W Contrast  Result Date: 02/02/2019 CLINICAL DATA:  58 year old female with acute abdominal and pelvic pain. EXAM: CT ABDOMEN AND PELVIS WITH CONTRAST TECHNIQUE: Multidetector CT imaging of the abdomen and pelvis was performed using the standard protocol following bolus administration of intravenous contrast. CONTRAST:  146mL OMNIPAQUE IOHEXOL 300 MG/ML  SOLN COMPARISON:  None. FINDINGS: Lower chest: No acute abnormality Hepatobiliary: The liver and gallbladder are unremarkable. No biliary dilatation. Pancreas: Unremarkable Spleen: Unremarkable Adrenals/Urinary Tract: Kidneys, adrenal glands and bladder are unremarkable except for a probable RIGHT renal cyst. Stomach/Bowel: A large infraumbilical hernia containing small bowel loops is identified. Dilated small bowel loops proximal to this hernia noted compatible with small bowel obstruction. There is some fluid within the terminal ileum and gas within the colon. No bowel wall thickening identified. Vascular/Lymphatic: Mild aortic atherosclerosis. No enlarged abdominal or pelvic lymph nodes. Reproductive: Uterus and bilateral adnexa are unremarkable except for probable small calcified fibroid. Other: Trace amount of free fluid within pelvis is noted. No evidence of abscess or pneumoperitoneum. Musculoskeletal: No acute or suspicious bony abnormalities. IMPRESSION: 1. Large infraumbilical hernia containing small bowel loops and causing small bowel obstruction. Trace amount of free pelvic fluid. No evidence of abscess or pneumoperitoneum. 2.  Aortic Atherosclerosis (ICD10-I70.0). Electronically Signed   By: Margarette Canada M.D.   On: 02/02/2019 20:04   Lyndee Hensen, MD 02/05/2019, 9:54  AM PGY-1, [place] Intern pager: [phone], text pages welcome

## 2019-02-05 NOTE — TOC Initial Note (Addendum)
Transition of Care [facility]) - Initial/Assessment Note    Patient Details  Name: Kelsey Bullock MRN: [id number] Date of Birth: 04/14/1961  [facility] CM/SW Contact:    Marilu Favre, RN Phone Number: 02/05/2019, 4:03 PM  Clinical Narrative:                    Confirmed face sheet information. Patient from home with son. Patient does not have preference in home health agencies. Explained it is every difficult to get HHPT for Medicaid patients in [place]. NCM will call every agency that covers her address . If unable to arrange home health asked if patient would be able to go to Outpatient PT, patient stated her son would not be able to to take her to OP PT .   Patient asking for a tub bench. She had one but it bent due to her weight but she has lost 140 pounds since then. Will order through [facility] . Patient also requesting a recliner chair with lift. Explained her or her family would have to go to a medical supply store to pick one out.   Ordered tub bench and walker through Zack with [place] with [facility] has accepted HHPT referral.  Patient voiced understanding to all of above.      Patient Goals and CMS Choice Patient states their goals for this hospitalization and ongoing recovery are:: to go home CMS Medicare.gov Compare Post Acute Care list provided to:: Patient Choice offered to / list presented to : Patient  Expected Discharge Plan and Services     Discharge Planning Services: CM Consult Post Acute Care Choice: Home Health, Durable Medical Equipment Living arrangements for the past 2 months: Apartment                 DME Arranged: Tub bench DME Agency: [employer] Date DME Agency Contacted: 02/05/19 Time DME Agency Contacted: [phone] Representative spoke with at DME Agency: [place] Arrangements/Services Living arrangements for the past 2 months: [place] with:: Adult Children Patient  language and need for interpreter reviewed:: Yes Do you feel safe going back to the place where you live?: Yes               Activities of Daily Living Home Assistive Devices/Equipment: Cane (specify quad or straight) ADL Screening (condition at time of admission) Patient's cognitive ability adequate to safely complete daily activities?: Yes Is the patient deaf or have difficulty hearing?: No Does the patient have difficulty seeing, even when wearing glasses/contacts?: No Does the patient have difficulty concentrating, remembering, or making decisions?: No Patient able to express need for assistance with ADLs?: Yes Does the patient have difficulty dressing or bathing?: No Independently performs ADLs?: Yes (appropriate for developmental age) Does the patient have difficulty walking or climbing stairs?: No Weakness of Legs: None Weakness of Arms/Hands: None  Permission Sought/Granted   Permission granted to share information with : Yes, Verbal Permission Granted     Permission granted to share info w AGENCY: [employer] and Home health agencies        Emotional Assessment Appearance:: Appears older than stated age Attitude/Demeanor/Rapport: Engaged Affect (typically observed): Accepting Orientation: : Oriented to Self, Oriented to Place, Oriented to  Time, Oriented to Situation Alcohol / Substance Use: Not Applicable Psych Involvement: No (comment)  Admission diagnosis:  Small bowel obstruction ([place]) [K56.609] Abdominal hernia with obstruction [id number]]  Patient Active Problem List   Diagnosis Date Noted  . Abdominal hernia with obstruction   . Moderate persistent asthma without complication   . Small bowel obstruction ([place]) 02/02/2019  . Paronychia of great toe of left foot 08/16/2018  . Hx of adenomatous colonic polyps   . History of colonic polyps 03/28/2018  . Chronic diarrhea 03/28/2018  . Pilonidal cyst 02/22/2018  . Diastolic CHF with preserved left ventricular function,  NYHA class 2 ([place]) 12/14/2016  . Costochondritis 03/30/2016  . Allergic rhinitis 02/06/2015  . GERD (gastroesophageal reflux disease) 02/06/2015  . Severe persistent asthma 02/06/2015  . Intertrigo 10/22/2014  . Depression, major, recurrent ([place]) 02/19/2014  . Controlled type 2 diabetes mellitus with neuropathy ([place]) 01/25/2011  . SLEEP APNEA 03/10/2009  . Seborrheic eczema 07/17/2007  . LOW BACK PAIN SYNDROME 11/26/2006  . HYPERCHOLESTEROLEMIA 08/02/2006  . Morbid obesity ([place]) 08/02/2006  . ANXIETY 08/02/2006  . PSEUDOTUMOR CEREBRI 08/02/2006  . HYPERTENSION, BENIGN SYSTEMIC 08/02/2006  . OSTEOARTHRITIS, LOWER LEG 08/02/2006  . FIBROMYALGIA, FIBROMYOSITIS 08/02/2006   PCP:  [name], MD Pharmacy:   [facility], [place] - [address] AT [address] [place] [address] Phone: [phone] Fax: [place], [address] [place] [address] Phone: [phone] Fax: [phone]     Social Determinants of Health (SDOH) Interventions    Readmission Risk Interventions No flowsheet data found.

## 2019-02-05 NOTE — Progress Notes (Signed)
Patient ID: Kelsey Bullock, female   DOB: 1961-05-25, 58 y.o.   MRN: [id number]    2 Days Post-Op  Subjective: Eating a lot of ice.  No bowel function yet.  Up in chair today.  Pain seems fairly well controlled.  Objective: Vital signs in last 24 hours: Temp:  [98.2 F (36.8 C)-98.7 F (37.1 C)] 98.6 F (37 C) (09/02 0555) Pulse Rate:  [80-110] 93 (09/02 0555) Resp:  [16-18] 17 (09/02 0555) BP: (107-117)/(45-74) 108/45 (09/02 0555) SpO2:  [91 %-95 %] 91 % (09/02 0555) Last BM Date: 03/02/19  Intake/Output from previous day: 09/01 0701 - 09/02 0700 In: 2100.6 [P.O.:170; I.V.:1880.6; IV Piggyback:50] Out: [id number] [Urine:1600; Emesis/NG output:1690; Drains:105] Intake/Output this shift: No intake/output data recorded.  PE: Abd: soft, morbidly obese, appropriately tender, minimal BS, NGT cannister with about 700cc of output, a lot of this is probably ice.  Midline dressing clean.  JP with serosang output, minimal 105cc yesterday  Lab Results:  Recent Labs    02/02/19 1810 02/02/19 1821 02/03/19 0135  WBC 16.0*  --  14.9*  HGB 15.8* 17.0* 14.7  HCT 48.5* 50.0* 44.7  PLT 290  --  233   BMET Recent Labs    02/02/19 1810 02/02/19 1821 02/03/19 0135  NA 133* 136 133*  K 3.8 3.8 3.9  CL 97* 99 96*  CO2 21*  --  22  GLUCOSE 116* 116* 148*  BUN 6 6 7   CREATININE 0.72 0.60 0.76  CALCIUM 9.5  --  9.2   PT/INR No results for input(s): LABPROT, INR in the last 72 hours. CMP     Component Value Date/Time   NA 133 (L) 02/03/2019 0135   NA 143 01/18/2017 1028   K 3.9 02/03/2019 0135   CL 96 (L) 02/03/2019 0135   CO2 22 02/03/2019 0135   GLUCOSE 148 (H) 02/03/2019 0135   BUN 7 02/03/2019 0135   BUN 11 01/18/2017 1028   CREATININE 0.76 02/03/2019 0135   CREATININE 0.64 08/26/2015 0900   CALCIUM 9.2 02/03/2019 0135   PROT 8.1 02/02/2019 1810   PROT 7.2 10/16/2016 1006   ALBUMIN 4.0 02/02/2019 1810   ALBUMIN 4.5 10/16/2016 1006   AST 18 02/02/2019 1810   ALT 20  02/02/2019 1810   ALKPHOS 82 02/02/2019 1810   BILITOT 1.9 (H) 02/02/2019 1810   BILITOT 0.4 10/16/2016 1006   GFRNONAA >60 02/03/2019 0135   GFRNONAA >89 08/26/2015 0900   GFRAA >60 02/03/2019 0135   GFRAA >89 08/26/2015 0900   Lipase     Component Value Date/Time   LIPASE 22 02/02/2019 1810       Studies/Results: No results found.  Anti-infectives: Anti-infectives (From admission, onward)   Start     Dose/Rate Route Frequency Ordered Stop   02/03/19 0900  vancomycin (VANCOCIN) 1,500 mg in sodium chloride 0.9 % 500 mL IVPB     1,500 mg 250 mL/hr over 120 Minutes Intravenous  Once 02/03/19 0803 02/03/19 1048       Assessment/Plan Severe obesity Hypertension Hyperlipidemia PreDiabetes Obstructive sleep apnea  POD 2, s/p open primary ventral hernia repair with SBR for ischemic bowel, Dr. Georgette Dover 8/31 -voiding well -cont NGT and await bowel function -mobilize, PT consult pending -IS and pulm toilet -cont JP drain  FEN - NPO/IVFs/NGT VTE - Lovenox, SCDs ID - vanc pre-op, none currently   LOS: 3 days    Henreitta Cea , [facility] Surgery 02/05/2019, 8:34 AM Pager: [phone]

## 2019-02-05 NOTE — Anesthesia Postprocedure Evaluation (Signed)
Anesthesia Post Note  Patient: CAMREE CABA  Procedure(s) Performed: EXPLORATORY LAPAROTOMY WITH SMALL BOWEL RESECTION AND PRIMARY CLOSURE OF VENTRAL HERNIA (N/A Abdomen)     Patient location during evaluation: PACU Anesthesia Type: General Level of consciousness: awake and alert Pain management: pain level controlled Vital Signs Assessment: post-procedure vital signs reviewed and stable Respiratory status: spontaneous breathing, nonlabored ventilation, respiratory function stable and patient connected to nasal cannula oxygen Cardiovascular status: blood pressure returned to baseline and stable Postop Assessment: no apparent nausea or vomiting Anesthetic complications: no    Last Vitals:  Vitals:   02/05/19 0844 02/05/19 1606  BP:  129/62  Pulse: 87 87  Resp: 16 19  Temp:  37.4 C  SpO2: 92% 91%    Last Pain:  Vitals:   02/05/19 1606  TempSrc: Oral  PainSc:                  Lidia Collum

## 2019-02-05 NOTE — Progress Notes (Signed)
Physical Therapy Treatment Patient Details Name: Kelsey Bullock MRN: [id number] DOB: 1960/06/09 Today's Date: 02/05/2019    History of Present Illness Pt is a 58 y.o. female admitted 02/02/19 with diffuse abdominal pain. Pt with SBO herniated through an infraumbilical fascial defect. S/p exlap with small bowel resection and repair of ventral hernia. PMH includes CHF, asthma, depression, morbid obesity.   PT Comments    Pt slowly progressing with mobility. Able to transfer and ambulate without device and intermittent min guard for balance. Pt reports ambulating in hallway with sister earlier today, but OOB mobility has been limited by pain and dizziness. Recommend use of rollator for added stability and energy conservation (case manager aware); would benefit from tub bench as pt does not have activity tolerance to stand for bathing. Appreciate Case Manager's efforts for getting pt set up with HHPT services. Will continue to follow acutely.   Follow Up Recommendations  Home health PT;Supervision - Intermittent     Equipment Recommendations  Other (comment)(bariatric rollator, bariatric tub bench)    Recommendations for Other Services       Precautions / Restrictions Precautions Precaution Comments: NGT, abdominal for comfort Restrictions Weight Bearing Restrictions: No    Mobility  Bed Mobility Overal bed mobility: Needs Assistance Bed Mobility: Supine to Sit     Supine to sit: Mod assist     General bed mobility comments: ModA for UE support to assist trunk elevation, HOB minimally elevated; limited by abdominal pain and body habitus  Transfers Overall transfer level: Needs assistance Equipment used: Rolling walker (2 wheeled);None Transfers: Sit to/from Stand Sit to Stand: Supervision         General transfer comment: Supervision to stand from EOB and BSC (over toilet), heavy reliance on UE support to push into standing  Ambulation/Gait Ambulation/Gait assistance:  Supervision;Min guard Gait Distance (Feet): 80 Feet Assistive device: Rolling walker (2 wheeled);IV Pole;None Gait Pattern/deviations: Step-through pattern;Decreased stride length;Wide base of support Gait velocity: Decreased Gait velocity interpretation: <1.8 ft/sec, indicate of risk for recurrent falls General Gait Details: Slow, guarded gait with intermittent min guard for balance; initial trial with bari RW but pt did not like how it turned, additional distance with and without IV pole   Stairs             Wheelchair Mobility    Modified Rankin (Stroke Patients Only)       Balance Overall balance assessment: Needs assistance   Sitting balance-Leahy Scale: Fair       Standing balance-Leahy Scale: Fair                              Cognition Arousal/Alertness: Awake/alert Behavior During Therapy: WFL for tasks assessed/performed Overall Cognitive Status: Within Functional Limits for tasks assessed                                        Exercises      General Comments        Pertinent Vitals/Pain Pain Assessment: Faces Faces Pain Scale: Hurts little more Pain Location: Abdomen Pain Descriptors / Indicators: Sore Pain Intervention(s): Monitored during session    Home Living                      Prior Function            PT Goals (  current goals can now be found in the care plan section) Acute Rehab PT Goals Patient Stated Goal: Decreased pain and dizziness PT Goal Formulation: With patient Time For Goal Achievement: 02/18/19 Potential to Achieve Goals: Good Progress towards PT goals: Progressing toward goals    Frequency    Min 3X/week      PT Plan Equipment recommendations need to be updated    Co-evaluation              AM-PAC PT "6 Clicks" Mobility   Outcome Measure  Help needed turning from your back to your side while in a flat bed without using bedrails?: A Little Help needed moving  from lying on your back to sitting on the side of a flat bed without using bedrails?: A Lot Help needed moving to and from a bed to a chair (including a wheelchair)?: A Little Help needed standing up from a chair using your arms (e.g., wheelchair or bedside chair)?: A Little Help needed to walk in hospital room?: A Little Help needed climbing 3-5 steps with a railing? : A Little 6 Click Score: 17    End of Session   Activity Tolerance: Patient tolerated treatment well;Patient limited by fatigue Patient left: in chair;with call bell/phone within reach Nurse Communication: Mobility status PT Visit Diagnosis: Other abnormalities of gait and mobility (R26.89);Pain     Time: [id number] PT Time Calculation (min) (ACUTE ONLY): 29 min  Charges:  $Gait Training: 8-22 mins $Therapeutic Activity: 8-22 mins                     Mabeline Caras, PT, DPT Acute Rehabilitation Services  Pager [phone] Office [place] 02/05/2019, 5:37 PM

## 2019-02-06 ENCOUNTER — Inpatient Hospital Stay (HOSPITAL_COMMUNITY): Payer: Medicaid Other

## 2019-02-06 DIAGNOSIS — E46 Unspecified protein-calorie malnutrition: Secondary | ICD-10-CM

## 2019-02-06 DIAGNOSIS — E440 Moderate protein-calorie malnutrition: Secondary | ICD-10-CM | POA: Insufficient documentation

## 2019-02-06 LAB — BASIC METABOLIC PANEL
Anion gap: 10 (ref 5–15)
BUN: 11 mg/dL (ref 6–20)
CO2: 30 mmol/L (ref 22–32)
Calcium: 8.2 mg/dL — ABNORMAL LOW (ref 8.9–10.3)
Chloride: 100 mmol/L (ref 98–111)
Creatinine, Ser: 0.68 mg/dL (ref 0.44–1.00)
GFR calc Af Amer: >60 mL/min (ref >60)
GFR calc non Af Amer: >60 mL/min (ref >60)
Glucose, Bld: 126 mg/dL — ABNORMAL HIGH (ref 70–99)
Potassium: 3.4 mmol/L — ABNORMAL LOW (ref 3.5–5.1)
Sodium: 140 mmol/L (ref 135–145)

## 2019-02-06 LAB — CBC
HCT: 35.6 % — ABNORMAL LOW (ref 36.0–46.0)
Hemoglobin: 11.6 g/dL — ABNORMAL LOW (ref 12.0–15.0)
MCH: 29.4 pg (ref 26.0–34.0)
MCHC: 32.6 g/dL (ref 30.0–36.0)
MCV: 90.1 fL (ref 80.0–100.0)
Platelets: 207 10*3/uL (ref 150–400)
RBC: 3.95 MIL/uL (ref 3.87–5.11)
RDW: 14.4 % (ref 11.5–15.5)
WBC: 12.9 10*3/uL — ABNORMAL HIGH (ref 4.0–10.5)
nRBC: 0 % (ref 0.0–0.2)

## 2019-02-06 LAB — URINALYSIS, ROUTINE W REFLEX MICROSCOPIC
Glucose, UA: NEGATIVE mg/dL
Hgb urine dipstick: NEGATIVE
Ketones, ur: 20 mg/dL — AB
Leukocytes,Ua: NEGATIVE
Nitrite: POSITIVE — AB
Protein, ur: 30 mg/dL — AB
Specific Gravity, Urine: 1.025 (ref 1.005–1.030)
pH: 7 (ref 5.0–8.0)

## 2019-02-06 LAB — BRAIN NATRIURETIC PEPTIDE: B Natriuretic Peptide: 65.6 pg/mL (ref 0.0–100.0)

## 2019-02-06 MED ORDER — POTASSIUM CHLORIDE 10 MEQ/100ML IV SOLN
10.0000 meq | INTRAVENOUS | Status: AC
  Administered 2019-02-06 (×4): 10 meq via INTRAVENOUS
  Filled 2019-02-06 (×4): qty 100

## 2019-02-06 MED ORDER — FAMOTIDINE IN NACL 20-0.9 MG/50ML-% IV SOLN
20.0000 mg | Freq: Two times a day (BID) | INTRAVENOUS | Status: DC
  Administered 2019-02-06 – 2019-02-09 (×7): 20 mg via INTRAVENOUS
  Filled 2019-02-06 (×7): qty 50

## 2019-02-06 MED ORDER — METRONIDAZOLE IN NACL 5-0.79 MG/ML-% IV SOLN
500.0000 mg | Freq: Three times a day (TID) | INTRAVENOUS | Status: DC
  Administered 2019-02-06 – 2019-02-08 (×5): 500 mg via INTRAVENOUS
  Filled 2019-02-06 (×6): qty 100

## 2019-02-06 MED ORDER — ASPIRIN 300 MG RE SUPP
300.0000 mg | Freq: Four times a day (QID) | RECTAL | Status: AC | PRN
  Filled 2019-02-06: qty 1

## 2019-02-06 MED ORDER — LEVOFLOXACIN IN D5W 750 MG/150ML IV SOLN
750.0000 mg | INTRAVENOUS | Status: AC
  Administered 2019-02-06 – 2019-02-08 (×3): 750 mg via INTRAVENOUS
  Filled 2019-02-06 (×3): qty 150

## 2019-02-06 MED ORDER — LACTATED RINGERS IV SOLN
INTRAVENOUS | Status: DC
  Administered 2019-02-06 – 2019-02-10 (×8): via INTRAVENOUS

## 2019-02-06 MED ORDER — SODIUM CHLORIDE 0.9 % IV SOLN
25.0000 mg | INTRAVENOUS | Status: DC | PRN
  Filled 2019-02-06: qty 0.5

## 2019-02-06 MED ORDER — ACETAMINOPHEN 650 MG RE SUPP
650.0000 mg | RECTAL | Status: DC | PRN
  Administered 2019-02-06: 650 mg via RECTAL
  Filled 2019-02-06: qty 1

## 2019-02-06 NOTE — Progress Notes (Signed)
Patient ID: Kelsey Bullock, female   DOB: [birth year], 58 y.o.   MRN: [id number]    3 Days Post-Op  Subjective: C/o being dizzy today.  Unclear why. Minimal flatus at best. About 2.5L from NGT yesterday and minimizing her ice chips  Objective: Vital signs in last 24 hours: Temp:  [98.4 F (36.9 C)-99.9 F (37.7 C)] 99.9 F (37.7 C) (09/03 0530) Pulse Rate:  [87-97] 95 (09/03 0828) Resp:  [16-19] 18 (09/03 0828) BP: (120-129)/(57-65) 121/57 (09/03 0530) SpO2:  [90 %-93 %] 93 % (09/03 0828) Last BM Date: 01/30/19  Intake/Output from previous day: 09/02 0701 - 09/03 0700 In: 1677.6 [P.O.:80; I.V.:1597.6] Out: 3320 [Urine:700; Emesis/NG output:2500; Drains:120] Intake/Output this shift: No intake/output data recorded.  PE: Abd: soft, hypoactive BS, midline wound is clean and dressing intact. JP drain with mostly serous output.  NGT with bilious bloody output. 2.5L noted yesterday  Lab Results:  Recent Labs    02/05/19 1002 02/06/19 0257  WBC 12.6* 12.9*  HGB 13.0 11.6*  HCT 40.7 35.6*  PLT 222 207   BMET Recent Labs    02/05/19 1002 02/06/19 0257  NA 140 140  K 3.6 3.4*  CL 99 100  CO2 28 30  GLUCOSE 103* 126*  BUN 11 11  CREATININE 0.82 0.68  CALCIUM 8.7* 8.2*   PT/INR No results for input(s): LABPROT, INR in the last 72 hours. CMP     Component Value Date/Time   NA 140 02/06/2019 0257   NA 143 01/18/2017 1028   K 3.4 (L) 02/06/2019 0257   CL 100 02/06/2019 0257   CO2 30 02/06/2019 0257   GLUCOSE 126 (H) 02/06/2019 0257   BUN 11 02/06/2019 0257   BUN 11 01/18/2017 1028   CREATININE 0.68 02/06/2019 0257   CREATININE 0.64 08/26/2015 0900   CALCIUM 8.2 (L) 02/06/2019 0257   PROT 8.1 02/02/2019 1810   PROT 7.2 10/16/2016 1006   ALBUMIN 4.0 02/02/2019 1810   ALBUMIN 4.5 10/16/2016 1006   AST 18 02/02/2019 1810   ALT 20 02/02/2019 1810   ALKPHOS 82 02/02/2019 1810   BILITOT 1.9 (H) 02/02/2019 1810   BILITOT 0.4 10/16/2016 1006   GFRNONAA >60  02/06/2019 0257   GFRNONAA >89 08/26/2015 0900   GFRAA >60 02/06/2019 0257   GFRAA >89 08/26/2015 0900   Lipase     Component Value Date/Time   LIPASE 22 02/02/2019 1810       Studies/Results: No results found.  Anti-infectives: Anti-infectives (From admission, onward)   Start     Dose/Rate Route Frequency Ordered Stop   02/03/19 0900  vancomycin (VANCOCIN) 1,500 mg in sodium chloride 0.9 % 500 mL IVPB     1,500 mg 250 mL/hr over 120 Minutes Intravenous  Once 02/03/19 0803 02/03/19 1048       Assessment/Plan Severe obesity Hypertension Hyperlipidemia PreDiabetes Obstructive sleep apnea New onset dizziness - defer to medicine, hgb is stable Hypokalemia - replace today to help with bowel function as well  POD 3, s/p open primary ventral hernia repair with SBR for ischemic bowel, Dr. Georgette Dover 8/31 -cont NGT and await bowel function secondary to post op ileus -add pepcid for gastric protection given bloody output noted in NGT -mobilize -IS and pulm toilet -cont JP drain, put out 120cc yesterday  FEN -NPO/IVFs/NGT VTE -Lovenox, SCDs ID -vanc pre-op, none currently   LOS: 4 days    Henreitta Cea , [facility] 02/06/2019, 8:50 AM Pager: [phone]

## 2019-02-06 NOTE — Progress Notes (Signed)
Pt. Refused cpap. 

## 2019-02-06 NOTE — Progress Notes (Addendum)
Family Medicine Teaching Service Daily Progress Note Intern Pager: [phone]  Patient name: Kelsey Bullock Medical record number: [id number] Date of birth: 1960/09/15 Age: 58 y.o. Gender: female  Primary Care Provider: [name], MD Consultants: Surgery Code Status: Full  Pt Overview and Major Events to Date:  8/30-admitted to FPTS for small bowel obstruction   Assessment and Plan: Kelsey Bullock is a 58 y.o. female with hx of asthma, obesity, allergic rhinitis, CHF, GERD, gall stones and depression admitted with abdomin pain for past 6 days s/p small bowel resection.   Small bowel obstruction  Postop day 2 of 15 cm small bowel resection performed on 8/31. Breyelle abdominal pain still present.  Continues to go to the restroom and denies dysuria but there is not been any bowel movements.  NG tube output is 2.5L bloody bilious.  We will add on oral care swabs to help with thickened secretions.  JP drain output is serosanguineous.  Nausea continues to be managed with Zofran as needed.  Potassium 3.4 will be repleted with 10 mEq IV.    Patient with hx of multiple surgeries in the past (cyst removal (21 yr ago), kidney stone removal and two C-sections. General surgery following.  Admission CT showed large intra-abdominal call hernia containing small bowel loops causing SBO.   -Continue NPO, ice chips allowed, add oral care swabs  - Discontinue Afrin  -Continue NG tube until bowel function returns  -IV Zofran -IV fluids  -Foley catheter removed - Lovenox 80 mg  - Morphine PRN for pain -PT recommends home health PT -Trend electrolytes   [id number] Diastolic HFpEF -Echo [id number] LVEF 60-65% -No home Lasix although 40 mg prescribed  -Does not appear to be fluid overloaded at this time.    GERD -Home meds include tums, pepcid, priolosec -Hold home meds for now. NPO  Asthma  -Continue albuterol nebulizer and dulera -Hold omalizumab, duoneb, atrovent, qvar  while  NPO  Obesity She has been trying to lose weight over the past few months and reports that she is lost about 80 pounds intentionally. -No recorded weight for this admission  - weight: 164.6kg, BMI 62.3    Allergic rhinitis  -Continue flonase -Hold PO home meds zyrtec, singular, pataday  Depression - hold home meds for now Klonipin 1mg , Remeron   - CIWA scoring  OSA -H/H elevated -Patient unable to wear CPAP due to NG tube  Chronic back pain -hold home med Fexeril 10mg  for now while NPO  Protein Calorie Malnutrition Acute on chronic moderate protein calorie malnutrition   FEN/GI: NPO, mIVF LR 150 mL/hr PPx: Lovenox 80 mg  Disposition: Pending surgical clearance,  Likely d/c to sister's [name]) home   Subjective:  Kelsey Bullock continues to have abdominal pain.  Nausea is improved.  Denies flatulence.  There has not been any stool production. Reports dizziness after sucking on a throat lozenge.  Reports dizziness is not room spinning and began after sucking on a throat lozenge.  Dizziness is improving.  Patient has not been eating as she has NG tube but states she is not hungry.   There were no significant overnight events.  Objective: Temp:  [98.4 F (36.9 C)-99.9 F (37.7 C)] 99.9 F (37.7 C) (09/03 0530) Pulse Rate:  [87-97] 95 (09/03 0828) Resp:  [16-19] 18 (09/03 0828) BP: (120-129)/(57-65) 121/57 (09/03 0530) SpO2:  [90 %-93 %] 93 % (09/03 0828)    GEN: sitting upright in chair with feet up, eating ice chips, in  no acute distress  CV: regular rate and rhythm, no murmurs appreciated  RESP: no increased work of breathing, clear to auscultation bilaterally with no crackles, wheezes, or rhonchi, speaking in full sentences ABD: Scattered bowel sounds present, JP drain with serosanguineous fluid, surgical dressing without saturation, abdomen tender SKIN: warm, dry,  NEURO: grossly normal, moves all extremities appropriately     Laboratory: Recent  Labs  Lab 02/03/19 0135 02/05/19 1002 02/06/19 0257  WBC 14.9* 12.6* 12.9*  HGB 14.7 13.0 11.6*  HCT 44.7 40.7 35.6*  PLT 233 222 207   Recent Labs  Lab 02/02/19 1810  02/03/19 0135 02/05/19 1002 02/06/19 0257  NA 133*   < > 133* 140 140  K 3.8   < > 3.9 3.6 3.4*  CL 97*   < > 96* 99 100  CO2 21*  --  22 28 30   BUN 6   < > 7 11 11   CREATININE 0.72   < > 0.76 0.82 0.68  CALCIUM 9.5  --  9.2 8.7* 8.2*  PROT 8.1  --   --   --   --   BILITOT 1.9*  --   --   --   --   ALKPHOS 82  --   --   --   --   ALT 20  --   --   --   --   AST 18  --   --   --   --   GLUCOSE 116*   < > 148* 103* 126*   < > = values in this interval not displayed.   Hemoglobin A1c 6.1 Lactic acid-1.6  Imaging/Diagnostic Tests: Dg Abdomen 1 View  Result Date: 02/02/2019 CLINICAL DATA:  NG tube placement. EXAM: ABDOMEN - 1 VIEW COMPARISON:  None. FINDINGS: An NG tube is identified with tip overlying the distal stomach. IMPRESSION: NG tube tip overlying the distal stomach. Electronically Signed   By: Margarette Canada M.D.   On: 02/02/2019 21:54   Ct Abdomen Pelvis W Contrast  Result Date: 02/02/2019 CLINICAL DATA:  58 year old female with acute abdominal and pelvic pain. EXAM: CT ABDOMEN AND PELVIS WITH CONTRAST TECHNIQUE: Multidetector CT imaging of the abdomen and pelvis was performed using the standard protocol following bolus administration of intravenous contrast. CONTRAST:  125mL OMNIPAQUE IOHEXOL 300 MG/ML  SOLN COMPARISON:  None. FINDINGS: Lower chest: No acute abnormality Hepatobiliary: The liver and gallbladder are unremarkable. No biliary dilatation. Pancreas: Unremarkable Spleen: Unremarkable Adrenals/Urinary Tract: Kidneys, adrenal glands and bladder are unremarkable except for a probable RIGHT renal cyst. Stomach/Bowel: A large infraumbilical hernia containing small bowel loops is identified. Dilated small bowel loops proximal to this hernia noted compatible with small bowel obstruction. There is some  fluid within the terminal ileum and gas within the colon. No bowel wall thickening identified. Vascular/Lymphatic: Mild aortic atherosclerosis. No enlarged abdominal or pelvic lymph nodes. Reproductive: Uterus and bilateral adnexa are unremarkable except for probable small calcified fibroid. Other: Trace amount of free fluid within pelvis is noted. No evidence of abscess or pneumoperitoneum. Musculoskeletal: No acute or suspicious bony abnormalities. IMPRESSION: 1. Large infraumbilical hernia containing small bowel loops and causing small bowel obstruction. Trace amount of free pelvic fluid. No evidence of abscess or pneumoperitoneum. 2.  Aortic Atherosclerosis (ICD10-I70.0). Electronically Signed   By: Margarette Canada M.D.   On: 02/02/2019 20:04   Lyndee Hensen, MD 02/06/2019, 9:52 AM PGY-1, [place] Intern pager: [phone], text pages welcome

## 2019-02-06 NOTE — Progress Notes (Addendum)
FPTS Interim Progress Note  S:  Kelsey Bullock's nurse called and reported patient had a fever about 102 F.  I went to go see the patient she also complains of a headache and lightheadedness.  She reported dizziness since yesterday and headache has been off on and off.  Denies dysuria.  Patient has not been using incentive spirometer regularly.   O: BP 119/60 (BP Location: Left Arm)   Pulse 95   Temp (!) 102 F (38.9 C) (Oral)   Resp 19   Ht 5\' 4"  (1.626 m)   Wt (!) 164.6 kg   SpO2 92%   BMI 62.29 kg/m    CVS: Regular rate and rhythm RESP: Normal work of breathing, no accessory muscle use, rales present,  Neuro: Cranial nerves II through X grossly normal, alert and oriented x4,  ABD: Generalized tenderness to the abdomen abdomen, surgical scar with minimal erythema, no warmth appreciated MSK: Normal gait  A/P: Encourage patient to use in incentive spirometry at least twice an hour.  Tylenol suppository or fever.  Will obtain blood cultures and chest x-ray.  Patient has no dysuria.  Will get a UA.  Lyndee Hensen, MD 02/06/2019, 3:15 PM PGY-1, [place] [facility] Service pager [phone]

## 2019-02-06 NOTE — Progress Notes (Addendum)
Family Medicine Teaching Service Daily Progress Note Intern Pager: [phone]  Patient name: Kelsey Bullock Medical record number: [id number] Date of birth: 1961/05/08 Age: 58 y.o. Gender: female  Primary Care Provider: [name], MD Consultants: Surgery Code Status: Full  Pt Overview and Major Events to Date:  8/30-admitted to FPTS for small bowel obstruction  8/31 -exploratory laparotomy performed 15 cm small bowel resection   Assessment and Plan: Kelsey Bullock is a 58 y.o. female with hx of asthma, obesity, allergic rhinitis, CHF, GERD, gall stones and depression admitted with abdomin pain for 6 days prior to admission s/p small bowel resection.   Small bowel obstruction  General surgery following. Postop day 4 of 15 cm small bowel resection performed on 8/31. Kelsey Bullock continues to have abdominal pain , shortness of breath breath and nausea this morning.  NG tube output is significantly decreased to 300 mL from 2.5 L.  Has decreased her ice chips intake.  Oral care swabs providing some improvement of thickened oral secretions.  JP drain output is serosanguineous.  Nausea maintained with Zofran. Potassium improving 4.4 today  from 3.4 yesterday after IV repletion.   Patient with hx of multiple surgeries in the past (cyst removal (21 yr ago), kidney stone removal and two C-sections. General surgery following.  Admission CT showed large intra-abdominal call hernia containing small bowel loops causing SBO.   -Continue NPO, ice chips allowed, add oral care swabs  -Continue NG tube until bowel function returns  -IV Zofran -IV fluids  - Lovenox 80 mg  - Morphine PRN for pain -PT recommends home health PT -Trend electrolytes  Postop Fever Yesterday the patient became febrile around 1430 which resolved with Tylenol and aspirin.  She has an anaphylactic type allergy to NSAIDs however reports taking aspirin and ibuprofen in the past.  T-max 102 F.  Patient has been afebrile since  around 2230.  Patient is postop day 4 status post small bowel resection.  She had not been using her incentive spirometry regularly. CXR indicated possible left lower lobe possible pneumonia.  UA indicated positive nitrites with hematuria, ketones proteinuria.  Urine and blood cultures are pending.  Source possibly UTI vs PNA however intraabdominal process can not be ruled out. Patient started on levofloxacin and Flagyl for broad-spectrum coverage as she has an anaphylaxis allergy to beta-lactam's and has not taken cephalosporins in the past.  -Broad-spectrum antibiotics (Levaquin 750 mg QD / Flagyl 500mg  Q8H,  Day 2), will narrow coverage pending urine and blood cultures results.  If culture if culture is negative will discontinue antibiotics because of fever is likely inflammatory -PRN Tylenol suppository q4h -PRN ASA suppository [id number]   [id number] Diastolic HFpEF -Echo [id number] LVEF 60-65% -No home Lasix although 40 mg prescribed  -Does not return appear to be fluid overloaded at this time despite chest x-ray indicating likely mild cardiomegaly. -Maintains good urinary output.   GERD -Home meds include tums, pepcid, priolosec.  Patient reports ongoing reflux-like symptoms. - IV famotidine -Hold home meds for now. NPO  Asthma  -Continue albuterol nebulizer and dulera -Hold omalizumab, duoneb, atrovent, qvar  while NPO  Severe Morbid Obesity She has been trying to lose weight over the past year and reports that she is lost about 100 pounds intentionally.  - weight: 164.6kg, BMI 62.3    Allergic rhinitis  -Continue flonase -Hold PO home meds zyrtec, singular, pataday  Depression - hold home meds for now Klonipin 1mg , Remeron   - CIWA scoring  OSA -H/H  elevated -Patient unable to wear CPAP due to NG tube  Chronic back pain -hold home med Fexeril 10mg  for now while NPO  Protein Calorie Malnutrition Acute on chronic moderate protein calorie malnutrition   FEN/GI: NPO, mIVF  LR 150 mL/hr  PPx: Lovenox 80 mg  Disposition: Pending surgical clearance,  Likely d/c home to stay with sister Kelsey Bullock)    Subjective:  Kelsey Bullock  Became febrile 102F yesterday afternoon that resolved with tylenol and asprin. Continues to have abdominal pain, and persistent nausea that is being controlled with medication.  Bowel function has not returned (no stools however reports some flatulence).  Dizziness and headache improving.  Denies dysuria.  Has some right ear pain.   Objective: Temp:  [98.1 F (36.7 C)-102 F (38.9 C)] 98.4 F (36.9 C) (09/04 [id number]) Pulse Rate:  [82-95] 87 (09/04 0614) Resp:  [18-20] 20 (09/04 0614) BP: (113-120)/(60-68) 120/63 (09/04 0614) SpO2:  [90 %-97 %] 97 % (09/04 0828)    GEN: Sitting upright getting ready for PT, no acute distress CV: Regular rate and rhythm no murmurs appreciated  RESP: No increased work of breathing, satting at 97%on room air, lung fields with rales in the bilateral lower lung fields, speaking in full sentences without pause ABD: Obese abdomen, soft, generalized tenderness with scattered bowel sounds, JP drain with serosanguineous output, surgical incision without purulence and minimal erythema  SKIN: warm, dry, no appreciable LE edema NEURO: Alert and oriented, grossly normal, moves all extremities appropriately      Laboratory: Recent Labs  Lab 02/05/19 1002 02/06/19 0257 02/07/19 0211  WBC 12.6* 12.9* 13.5*  HGB 13.0 11.6* 11.1*  HCT 40.7 35.6* 34.5*  PLT 222 207 212   Recent Labs  Lab 02/02/19 1810  02/05/19 1002 02/06/19 0257 02/07/19 0211  NA 133*   < > 140 140 137  K 3.8   < > 3.6 3.4* 4.4  CL 97*   < > 99 100 100  CO2 21*   < > 28 30 26   BUN 6   < > 11 11 10   CREATININE 0.72   < > 0.82 0.68 0.60  CALCIUM 9.5   < > 8.7* 8.2* 7.8*  PROT 8.1  --   --   --   --   BILITOT 1.9*  --   --   --   --   ALKPHOS 82  --   --   --   --   ALT 20  --   --   --   --   AST 18  --   --   --   --    GLUCOSE 116*   < > 103* 126* 80   < > = values in this interval not displayed.   Hemoglobin A1c 6.1 Lactic acid-1.6  Imaging/Diagnostic Tests: Dg Chest 2 View  Result Date: 02/06/2019 CLINICAL DATA:  Fever.  CHF.  Asthma. EXAM: CHEST - 2 VIEW COMPARISON:  07/26/2010 FINDINGS: Both views are degraded secondary to technique and patient body habitus. The lateral view is essentially nondiagnostic. Frontal view is oblique to the right. Heart is likely mildly enlarged. Limited evaluation for pleural fluid. No pneumothorax. Low lung volumes with resultant pulmonary interstitial prominence. The right lung is grossly clear. Possible left lower lobe airspace disease versus overlying soft tissues. IMPRESSION: Moderate to markedly degraded exam, as detailed above. Possible left lower lobe airspace disease/pneumonia. Electronically Signed   By: Abigail Miyamoto M.D.   On: 02/06/2019 18:05  Dg Abdomen 1 View  Result Date: 02/02/2019 CLINICAL DATA:  NG tube placement. EXAM: ABDOMEN - 1 VIEW COMPARISON:  None. FINDINGS: An NG tube is identified with tip overlying the distal stomach. IMPRESSION: NG tube tip overlying the distal stomach. Electronically Signed   By: Margarette Canada M.D.   On: 02/02/2019 21:54   Ct Abdomen Pelvis W Contrast  Result Date: 02/02/2019 CLINICAL DATA:  58 year old female with acute abdominal and pelvic pain. EXAM: CT ABDOMEN AND PELVIS WITH CONTRAST TECHNIQUE: Multidetector CT imaging of the abdomen and pelvis was performed using the standard protocol following bolus administration of intravenous contrast. CONTRAST:  184mL OMNIPAQUE IOHEXOL 300 MG/ML  SOLN COMPARISON:  None. FINDINGS: Lower chest: No acute abnormality Hepatobiliary: The liver and gallbladder are unremarkable. No biliary dilatation. Pancreas: Unremarkable Spleen: Unremarkable Adrenals/Urinary Tract: Kidneys, adrenal glands and bladder are unremarkable except for a probable RIGHT renal cyst. Stomach/Bowel: A large infraumbilical  hernia containing small bowel loops is identified. Dilated small bowel loops proximal to this hernia noted compatible with small bowel obstruction. There is some fluid within the terminal ileum and gas within the colon. No bowel wall thickening identified. Vascular/Lymphatic: Mild aortic atherosclerosis. No enlarged abdominal or pelvic lymph nodes. Reproductive: Uterus and bilateral adnexa are unremarkable except for probable small calcified fibroid. Other: Trace amount of free fluid within pelvis is noted. No evidence of abscess or pneumoperitoneum. Musculoskeletal: No acute or suspicious bony abnormalities. IMPRESSION: 1. Large infraumbilical hernia containing small bowel loops and causing small bowel obstruction. Trace amount of free pelvic fluid. No evidence of abscess or pneumoperitoneum. 2.  Aortic Atherosclerosis (ICD10-I70.0). Electronically Signed   By: Margarette Canada M.D.   On: 02/02/2019 20:04   Lyndee Hensen, MD 02/07/2019, 9:34 AM   PGY-1, [place] Intern pager: [phone], text pages welcome

## 2019-02-06 NOTE — Significant Event (Signed)
Rapid Response Event Note  Overview: MEWS   I got a call from the nurse about the MEWS score now being yellow. Per nurse, patient has a temperature and she has treated the patient for it. RN notified the primary service as well. NO RRT INTERVENTIONS    Saed Hudlow, [place]

## 2019-02-06 NOTE — Progress Notes (Signed)
FPTS Interim Progress Note  Patient with new fever (tmax 102) of unknown source at this time. Multiple sources could be cause including UA with (+) nitrites, CXR concerning for PNA, and history of recent abdominal surgery. Will treat with broad spectrum antibiotics. Patient has anaphylaxis to Penicillins. Discussed case with pharmacy and opted to start Levofloxacin and Flagyl with plan to narrow when able. Will follow up on blood culture, urine cultures and fever curve.  Got another page from nurse stating patient has another temp of 101.5 three hours after last rectal tylenol dose. Patient also has history of anaphylaxis to NSAIDs.  Discussed with patient and she notes that she has taken both Ibuprofen and ASA in the past without reaction. Will give ASA suppository q6 hours PRN with tylenol 650mg  q4 prn for fever. Will monitor closely.   Mina Marble [place], DO 02/06/2019, 5:31 PM PGY-, [place] Service pager [phone]

## 2019-02-06 NOTE — Progress Notes (Signed)
Pharmacy Antibiotic Note  Kelsey Bullock is a 58 y.o. female admitted on 02/02/2019 with fever of unclear etiology (intra-abdominal vs HAP vs UTI).  Pharmacy has been consulted for Levaquin and Flagyl dosing. Confirmed with FM team no MRSA coverage with Vancomycin for now.   New fever up to 102.  WBC is trending down.  S/p small bowel resection on 8/31. Patient has noted anaphylactic reaction to penicillins in the past.  No record of cephalosporin use in history that I can find.  Patient has tolerated fluoroquinolone in the past.  SCr 0.68 - stable for admission.  Plan: Levaquin 750 mg IV every 24 hours.  Flagyl 500 mg IV every 8 hours.  Monitor renal function, any culture results, and clinical status.  Consider need for MRSA coverage if clinical worsens as patient did have recent surgery.   Height: 5\' 4"  (162.6 cm) Weight: (!) 362 lb 14 oz (164.6 kg) IBW/kg (Calculated) : 54.7  Temp (24hrs), Avg:99.6 F (37.6 C), Min:98.2 F (36.8 C), Max:102 F (38.9 C)  Recent Labs  Lab 02/02/19 1810 02/02/19 1821 02/02/19 2025 02/02/19 2257 02/03/19 0135 02/05/19 1002 02/06/19 0257  WBC 16.0*  --   --   --  14.9* 12.6* 12.9*  CREATININE 0.72 0.60  --   --  0.76 0.82 0.68  LATICACIDVEN  --   --  1.6 1.6  --   --   --     Estimated Creatinine Clearance: 120.9 mL/min (by C-G formula based on SCr of 0.68 mg/dL).    Allergies  Allergen Reactions  . Etodolac Shortness Of Breath and Anaphylaxis    REACTION: lips swelled  . Penicillins Anaphylaxis and Swelling    DID THE REACTION INVOLVE: Swelling of the face/tongue/throat, SOB, or low BP? Yes Sudden or severe rash/hives, skin peeling, or the inside of the mouth or nose? No Did it require medical treatment? No When did it last happen?20 years ago If all above answers are "NO", may proceed with cephalosporin use.   Marland Kitchen Amoxicillin     REACTION: unspecified  . Ampicillin Swelling  . Augmentin [Amoxicillin-Pot Clavulanate]   .  Diclofenac Sodium     REACTION: unspecified  . Iodine     REACTION: unspecified  . Pantoprazole Sodium     REACTION: unspecified  . Adhesive [Tape] Rash    Antimicrobials this admission: Levaquin 9/3 >> Flagyl 9/3 >> Vancomycin 8/31 (pre-op) x1  Dose adjustments this admission:   Microbiology results: 9/3 Urine >> 9/3 Blood >> 8/31 MRSA/Staph PCR negative 8/30 COVID negative   Thank you for allowing pharmacy to be a part of this patient's care.  Sloan Leiter, PharmD, BCPS, BCCCP Clinical Pharmacist Clinical phone 02/06/2019 until 09:30PM260 [phone] Please refer to [facility] for [place] numbers 02/06/2019 5:17 PM

## 2019-02-07 ENCOUNTER — Inpatient Hospital Stay (HOSPITAL_COMMUNITY): Payer: Medicaid Other

## 2019-02-07 DIAGNOSIS — R5082 Postprocedural fever: Secondary | ICD-10-CM

## 2019-02-07 DIAGNOSIS — R509 Fever, unspecified: Secondary | ICD-10-CM

## 2019-02-07 DIAGNOSIS — R2689 Other abnormalities of gait and mobility: Secondary | ICD-10-CM | POA: Diagnosis not present

## 2019-02-07 DIAGNOSIS — E440 Moderate protein-calorie malnutrition: Secondary | ICD-10-CM

## 2019-02-07 LAB — BLOOD CULTURE ID PANEL (REFLEXED)

## 2019-02-07 LAB — BASIC METABOLIC PANEL
Anion gap: 11 (ref 5–15)
BUN: 10 mg/dL (ref 6–20)
CO2: 26 mmol/L (ref 22–32)
Calcium: 7.8 mg/dL — ABNORMAL LOW (ref 8.9–10.3)
Chloride: 100 mmol/L (ref 98–111)
Creatinine, Ser: 0.6 mg/dL (ref 0.44–1.00)
GFR calc Af Amer: >60 mL/min (ref >60)
GFR calc non Af Amer: >60 mL/min (ref >60)
Glucose, Bld: 80 mg/dL (ref 70–99)
Potassium: 4.4 mmol/L (ref 3.5–5.1)
Sodium: 137 mmol/L (ref 135–145)

## 2019-02-07 LAB — CBC
HCT: 34.5 % — ABNORMAL LOW (ref 36.0–46.0)
Hemoglobin: 11.1 g/dL — ABNORMAL LOW (ref 12.0–15.0)
MCH: 29.4 pg (ref 26.0–34.0)
MCHC: 32.2 g/dL (ref 30.0–36.0)
MCV: 91.3 fL (ref 80.0–100.0)
Platelets: 212 10*3/uL (ref 150–400)
RBC: 3.78 MIL/uL — ABNORMAL LOW (ref 3.87–5.11)
RDW: 14.6 % (ref 11.5–15.5)
WBC: 13.5 10*3/uL — ABNORMAL HIGH (ref 4.0–10.5)
nRBC: 0 % (ref 0.0–0.2)

## 2019-02-07 MED ORDER — WHITE PETROLATUM EX OINT
TOPICAL_OINTMENT | CUTANEOUS | Status: AC
  Administered 2019-02-07: 11:00:00
  Filled 2019-02-07: qty 28.35

## 2019-02-07 MED ORDER — PROCHLORPERAZINE EDISYLATE 10 MG/2ML IJ SOLN
10.0000 mg | Freq: Four times a day (QID) | INTRAMUSCULAR | Status: DC | PRN
  Administered 2019-02-07: 10 mg via INTRAVENOUS
  Filled 2019-02-07: qty 2

## 2019-02-07 MED ORDER — ORAL CARE MOUTH RINSE
15.0000 mL | Freq: Two times a day (BID) | OROMUCOSAL | Status: DC
  Administered 2019-02-07 – 2019-02-11 (×9): 15 mL via OROMUCOSAL

## 2019-02-07 MED ORDER — BISACODYL 10 MG RE SUPP
10.0000 mg | Freq: Once | RECTAL | Status: AC
  Administered 2019-02-07: 10 mg via RECTAL
  Filled 2019-02-07: qty 1

## 2019-02-07 NOTE — Progress Notes (Signed)
Physical Therapy Treatment Patient Details Name: Kelsey Bullock MRN: [id number] DOB: 04/10/1961 Today's Date: 02/07/2019    History of Present Illness Pt is a 58 y.o. female admitted 02/02/19 with diffuse abdominal pain. Pt with SBO herniated through an infraumbilical fascial defect. S/p exlap with small bowel resection and repair of ventral hernia.CXR 9/3- LLL PNA. PMH includes CHF, asthma, depression, morbid obesity.    PT Comments    Patient progressing well towards PT goals. Reports she continues to feel dizzy but it seems better than previously. No sway or feelings of lightheadedness reported. Improved ambulation distance with min guard assist for safety; use of IV pole for support. Declined using rollator/RW. 2/4 DOE noted, other VSS, HR 102 bpm, Sp02 96% on RA. Encouraged increasing activity with nursing staff. Will continue to follow and progress as tolerated.    Follow Up Recommendations  Home health PT;Supervision - Intermittent     Equipment Recommendations  Other (comment)(bari rollator, bari tub bench)    Recommendations for Other Services       Precautions / Restrictions Precautions Precautions: Fall;Other (comment) Precaution Comments: NGT, abdominal for comfort Restrictions Weight Bearing Restrictions: No    Mobility  Bed Mobility Overal bed mobility: Needs Assistance Bed Mobility: Rolling;Sidelying to Sit Rolling: Supervision Sidelying to sit: HOB elevated;Supervision       General bed mobility comments: No assist needed; heavy use of rail.  Transfers Overall transfer level: Needs assistance Equipment used: None Transfers: Sit to/from Stand Sit to Stand: Supervision         General transfer comment: Supervision for safety. Stood from [employer]. Transferred to chair post ambulation.  Ambulation/Gait Ambulation/Gait assistance: Min guard Gait Distance (Feet): 140 Feet Assistive device: IV Pole Gait Pattern/deviations: Step-through pattern;Decreased  stride length;Wide base of support Gait velocity: Decreased   General Gait Details: Slow, guarded gait with intermittent min guard for balance, holding onto IV pole for support.   Stairs             Wheelchair Mobility    Modified Rankin (Stroke Patients Only)       Balance Overall balance assessment: Needs assistance Sitting-balance support: Feet supported;No upper extremity supported Sitting balance-Leahy Scale: Good     Standing balance support: During functional activity Standing balance-Leahy Scale: Fair                              Cognition Arousal/Alertness: Awake/alert Behavior During Therapy: WFL for tasks assessed/performed Overall Cognitive Status: Within Functional Limits for tasks assessed                                        Exercises      General Comments        Pertinent Vitals/Pain Pain Assessment: Faces Faces Pain Scale: Hurts little more Pain Location: Abdomen Pain Descriptors / Indicators: Sore Pain Intervention(s): Monitored during session    Home Living                      Prior Function            PT Goals (current goals can now be found in the care plan section) Progress towards PT goals: Progressing toward goals    Frequency    Min 3X/week      PT Plan Current plan remains appropriate    Co-evaluation  AM-PAC PT "6 Clicks" Mobility   Outcome Measure  Help needed turning from your back to your side while in a flat bed without using bedrails?: A Little Help needed moving from lying on your back to sitting on the side of a flat bed without using bedrails?: A Little Help needed moving to and from a bed to a chair (including a wheelchair)?: A Little Help needed standing up from a chair using your arms (e.g., wheelchair or bedside chair)?: None Help needed to walk in hospital room?: A Little Help needed climbing 3-5 steps with a railing? : A Little 6 Click  Score: 19    End of Session   Activity Tolerance: Patient tolerated treatment well Patient left: in chair;with call bell/phone within reach;Other (comment)(MD in room) Nurse Communication: Mobility status PT Visit Diagnosis: Other abnormalities of gait and mobility (R26.89);Pain Pain - part of body: (abdomen)     Time: [id number] PT Time Calculation (min) (ACUTE ONLY): 20 min  Charges:  $Gait Training: 8-22 mins                     Wray Kearns, PT, DPT Acute Rehabilitation Services Pager [phone] Office [place] 02/07/2019, 10:09 AM

## 2019-02-07 NOTE — Progress Notes (Signed)
Patient ID: Kelsey Bullock, female   DOB: August 23, 1960, 58 y.o.   MRN: [id number]    4 Days Post-Op  Subjective: Patient s/o worsening abdominal pain today, back pain, HA, mucus in her throat from her NGT.  No further flatus.  However, NGT output significantly down.  Fever of 102 noted from yesterday   Objective: Vital signs in last 24 hours: Temp:  [98.1 F (36.7 C)-102 F (38.9 C)] 98.8 F (37.1 C) (09/04 1017) Pulse Rate:  [82-95] 82 (09/04 1017) Resp:  [18-20] 19 (09/04 1017) BP: (113-131)/(60-73) 131/73 (09/04 1017) SpO2:  [90 %-97 %] 92 % (09/04 1017) Last BM Date: 01/30/19  Intake/Output from previous day: 09/03 0701 - 09/04 0700 In: 1757.5 [P.O.:40; I.V.:717.5; IV Piggyback:1000] Out: [id number] [Urine:1000; Emesis/NG output:300; Drains:45] Intake/Output this shift: Total I/O In: 0  Out: 250 [Urine:250]  PE: Abd: morbidly obese, still appropriately tender, incision is c/d/i with staples in place.  No erythema or evidence of infection around incision.  JP drain with serosang output.  Output down to 25cc.  NGT with minimal output.  300cc noted yesterday.    Lab Results:  Recent Labs    02/06/19 0257 02/07/19 0211  WBC 12.9* 13.5*  HGB 11.6* 11.1*  HCT 35.6* 34.5*  PLT 207 212   BMET Recent Labs    02/06/19 0257 02/07/19 0211  NA 140 137  K 3.4* 4.4  CL 100 100  CO2 30 26  GLUCOSE 126* 80  BUN 11 10  CREATININE 0.68 0.60  CALCIUM 8.2* 7.8*   PT/INR No results for input(s): LABPROT, INR in the last 72 hours. CMP     Component Value Date/Time   NA 137 02/07/2019 0211   NA 143 01/18/2017 1028   K 4.4 02/07/2019 0211   CL 100 02/07/2019 0211   CO2 26 02/07/2019 0211   GLUCOSE 80 02/07/2019 0211   BUN 10 02/07/2019 0211   BUN 11 01/18/2017 1028   CREATININE 0.60 02/07/2019 0211   CREATININE 0.64 08/26/2015 0900   CALCIUM 7.8 (L) 02/07/2019 0211   PROT 8.1 02/02/2019 1810   PROT 7.2 10/16/2016 1006   ALBUMIN 4.0 02/02/2019 1810   ALBUMIN 4.5  10/16/2016 1006   AST 18 02/02/2019 1810   ALT 20 02/02/2019 1810   ALKPHOS 82 02/02/2019 1810   BILITOT 1.9 (H) 02/02/2019 1810   BILITOT 0.4 10/16/2016 1006   GFRNONAA >60 02/07/2019 0211   GFRNONAA >89 08/26/2015 0900   GFRAA >60 02/07/2019 0211   GFRAA >89 08/26/2015 0900   Lipase     Component Value Date/Time   LIPASE 22 02/02/2019 1810       Studies/Results: Dg Chest 2 View  Result Date: 02/06/2019 CLINICAL DATA:  Fever.  CHF.  Asthma. EXAM: CHEST - 2 VIEW COMPARISON:  07/26/2010 FINDINGS: Both views are degraded secondary to technique and patient body habitus. The lateral view is essentially nondiagnostic. Frontal view is oblique to the right. Heart is likely mildly enlarged. Limited evaluation for pleural fluid. No pneumothorax. Low lung volumes with resultant pulmonary interstitial prominence. The right lung is grossly clear. Possible left lower lobe airspace disease versus overlying soft tissues. IMPRESSION: Moderate to markedly degraded exam, as detailed above. Possible left lower lobe airspace disease/pneumonia. Electronically Signed   By: Abigail Miyamoto M.D.   On: 02/06/2019 18:05    Anti-infectives: Anti-infectives (From admission, onward)   Start     Dose/Rate Route Frequency Ordered Stop   02/06/19 1745  metroNIDAZOLE (FLAGYL) IVPB 500 mg  500 mg 100 mL/hr over 60 Minutes Intravenous Every 8 hours 02/06/19 1739     02/06/19 1745  levofloxacin (LEVAQUIN) IVPB 750 mg     750 mg 100 mL/hr over 90 Minutes Intravenous Every 24 hours 02/06/19 1744     02/03/19 0900  vancomycin (VANCOCIN) 1,500 mg in sodium chloride 0.9 % 500 mL IVPB     1,500 mg 250 mL/hr over 120 Minutes Intravenous  Once 02/03/19 0803 02/03/19 1048       Assessment/Plan Severe obesity Hypertension Hyperlipidemia PreDiabetes Obstructive sleep apnea New onset dizziness - defer to medicine, hgb is stable Hypokalemia - resolved  POD4, s/p open primary ventral hernia repair with SBR for  ischemic bowel, Dr. Georgette Dover 8/31 -cont NGT and await bowel function secondary to post op ileus, but given fever and worsening c/o pain, will start with a plain film. If this is negative and patient continues to have increasing WBC and fevers, then may need a CT scan.  However, abdomen does not appear acute at this time. -CXR yesterday questions a PNA, primary started patient on levaquin -add pepcid for gastric protection given bloody output noted in NGT, this has resolved -mobilize -IS and pulm toilet -cont JP drain for now, but can likely be removed if output remains low  FEN -NPO/IVFs/NGT VTE -Lovenox, SCDs ID -vanc pre-op, levaquin 9/3 -->   LOS: 5 days    Henreitta Cea , [facility] Surgery 02/07/2019, 12:04 PM Pager: [phone]

## 2019-02-07 NOTE — Discharge Summary (Signed)
[facility] Discharge Summary  Patient name: Kelsey Bullock Medical record number: [id number] Date of birth: [birth year] Age: 58 y.o. Gender: female Date of Admission: 02/02/2019  Date of Discharge:02/11/2019 Admitting Physician: Leeanne Rio, MD  Primary Care Provider: [name], MD Consultants: General surgery  Indication for Hospitalization: Small bowel obstruction  Discharge Diagnoses/Problem List:   Ventral hernia repair 2/2 incarcerated SBO UTI [id number] Diastolic HFpEF GERD Asthma Severe Morbid Obesity Allergic rhinitis Depression OSA Chronic back pain- stable Protein Calorie Malnutrition   Disposition: Home  Discharge Condition: Stable  Discharge Exam:   General: Appears well, no acute distress. Age appropriate. Cardiac: RRR, normal heart sounds, no murmurs Respiratory: CTAB, normal effort Abdomen: soft, nontender, nondistended Extremities: LL edema pitting, no cyanosis. Skin: Warm and dry, no rashes noted Neuro: alert and oriented, no focal deficits Psych: normal affect  Brief Hospital Course:  Kelsey Bullock  Received care in the ED after several days of worsening nausea, vomiting, abdominal pain and distention. Kelsey Bullock had had multiple abdominal surgeries in the past and work-up for small bowel obstruction was performed.  CT imaging showed a large and per umbilical hernia containing small bowel loops causing small bowel obstruction.  Patient had exploratory laparotomy on 02/03/2019.  Patient had ventral hernia repair and small bowel resection for ischemic bowel.  On postop day 3 patient became febrile. Chest x-ray indicated possible left lower pneumonia however the study was technically difficult to read.  UA showed positive nitrites.  Broad-spectrum antibiotics were started and narrowed with urine and blood cultures noting E coli. growth. Patient remained NPO for 7 days till bowel function returned.  Surgery has cleared  the patient for discharge the patient is medically stable.  Patient appropriately ready to be discharged home with home health.  Patient to follow-up outpatient with surgery.   issues for Follow Up:  1. Klonopin held during patient's stay, consider decreasing amount if able. 2. Follow up with surgery outpatient  Significant Procedures: Ventral hernia repair  Significant Labs and Imaging:  Recent Labs  Lab 02/07/19 0211 02/08/19 0250 02/09/19 0512  WBC 13.5* 13.9* 12.1*  HGB 11.1* 11.1* 10.7*  HCT 34.5* 34.6* 34.0*  PLT 212 205 237   Recent Labs  Lab 02/05/19 1002 02/06/19 0257 02/07/19 0211 02/08/19 0250 02/09/19 0512  NA 140 140 137 136 136  K 3.6 3.4* 4.4 4.0 3.5  CL 99 100 100 102 100  CO2 28 30 26  20* 23  GLUCOSE 103* 126* 80 70 80  BUN 11 11 10 9 8   CREATININE 0.82 0.68 0.60 0.61 0.68  CALCIUM 8.7* 8.2* 7.8* 8.0* 7.8*  ALKPHOS  --   --   --   --  76  AST  --   --   --   --  18  ALT  --   --   --   --  16  ALBUMIN  --   --   --   --  1.9*      Results/Tests Pending at Time of Discharge: None  Discharge Medications:  Allergies as of 02/11/2019      Reactions   Etodolac Shortness Of Breath, Anaphylaxis   REACTION: lips swelled   Penicillins Anaphylaxis, Swelling   DID THE REACTION INVOLVE: Swelling of the face/tongue/throat, SOB, or low BP? Yes Sudden or severe rash/hives, skin peeling, or the inside of the mouth or nose? No Did it require medical treatment? No When did it last happen?20 years ago If  all above answers are "NO", may proceed with cephalosporin use.   Amoxicillin    REACTION: unspecified   Ampicillin Swelling   Augmentin [amoxicillin-pot Clavulanate]    Diclofenac Sodium    REACTION: unspecified   Iodine    REACTION: unspecified   Pantoprazole Sodium    REACTION: unspecified   Adhesive [tape] Rash      Medication List    STOP taking these medications   aspirin 81 MG EC tablet   cetirizine 10 MG tablet Commonly known as:  ZYRTEC   clonazePAM 1 MG tablet Commonly known as: KLONOPIN   Dulera 200-5 MCG/ACT Aero Generic drug: mometasone-formoterol   ipratropium 0.06 % nasal spray Commonly known as: ATROVENT   ipratropium-albuterol 0.5-2.5 (3) MG/3ML Soln Commonly known as: DUONEB     TAKE these medications   acetaminophen 325 MG tablet Commonly known as: TYLENOL Take 2 tablets (650 mg total) by mouth every 6 (six) hours as needed for mild pain, fever or headache. What changed:   medication strength  how much to take  reasons to take this   albuterol (2.5 MG/3ML) 0.083% nebulizer solution Commonly known as: PROVENTIL USE 1 VIAL VIA NEBULIZER EVERY 4 TO 6 HOURS AS NEEDED FOR COUGH OR WHEEZING What changed:   how much to take  how to take this  when to take this  reasons to take this  additional instructions   albuterol 108 (90 Base) MCG/ACT inhaler Commonly known as: VENTOLIN HFA INHALE 2 PUFFS INTO THE LUNGS EVERY 4 HOURS AS NEEDED FOR WHEEZING What changed: See the new instructions.   atorvastatin 40 MG tablet Commonly known as: LIPITOR Take 1 tablet (40 mg total) by mouth at bedtime.   beclomethasone 80 MCG/ACT inhaler Commonly known as: Qvar INHALE 2 PUFFS BY MOUTH INTO THE LUNGS TWICE DAILY What changed:   how much to take  how to take this  when to take this  reasons to take this  additional instructions   calcium carbonate 500 MG chewable tablet Commonly known as: TUMS - dosed in mg elemental calcium Chew 2-3 tablets by mouth daily as needed for indigestion or heartburn.   CINNAMON PO Take 1 tablet by mouth daily as needed (for high bp).   EPINEPHrine 0.3 mg/0.3 mL Soaj injection Commonly known as: EpiPen 2-Pak Inject 0.3 mLs (0.3 mg total) into the muscle once for 1 dose. What changed:   when to take this  reasons to take this   famotidine 20 MG tablet Commonly known as: PEPCID Take 1 tablet (20 mg total) by mouth 2 (two) times daily. What changed:  when to take this   fluticasone 50 MCG/ACT nasal spray Commonly known as: FLONASE USE 2 SPRAYS IN EACH NOSTRIL ONCE DAILY FOR STUFFY NOSE OR DRAINAGE What changed:   how much to take  how to take this  when to take this  reasons to take this  additional instructions   furosemide 40 MG tablet Commonly known as: LASIX TAKE 1 TABLET(40 MG) BY MOUTH TWICE DAILY What changed:   how much to take  how to take this  when to take this  additional instructions   lisinopril 40 MG tablet Commonly known as: ZESTRIL TAKE 1 TABLET BY MOUTH EVERY DAY   montelukast 10 MG tablet Commonly known as: SINGULAIR TAKE 1 TABLET BY MOUTH AT BEDTIME What changed:   how much to take  how to take this  when to take this  additional instructions   nystatin powder Commonly known  as: MYCOSTATIN/NYSTOP Apply topically 3 (three) times daily.   omeprazole 40 MG capsule Commonly known as: PRILOSEC Take 1 capsule (40 mg total) by mouth daily. What changed:   when to take this  reasons to take this   oxyCODONE 5 MG immediate release tablet Commonly known as: Oxy IR/ROXICODONE Take 1 tablet (5 mg total) by mouth every 6 (six) hours as needed for moderate pain.   Pazeo 0.7 % Soln Generic drug: Olopatadine HCl Place 1 drop into both eyes daily as needed (allergies).   Xolair 150 MG injection Generic drug: omalizumab INJECT 300 MG UNDER THE SKIN EVERY 28 DAYS. What changed: See the new instructions.       Discharge Instructions: Please refer to Patient Instructions section of EMR for full details.  Patient was counseled important signs and symptoms that should prompt return to medical care, changes in medications, dietary instructions, activity restrictions, and follow up appointments.   Follow-Up Appointments:   Gerlene Fee, DO 02/13/2019, 1:32 PM PGY-1, [place]

## 2019-02-07 NOTE — TOC Progression Note (Signed)
Transition of Care [facility]) - Progression Note    Patient Details  Name: LINH DERUYTER MRN: [id number] Date of Birth: [birth year]  Transition of Care [facility] CM/SW Contact  Keats Kingry, Edson Snowball, RN Phone Number: 02/07/2019, 11:38 AM  Clinical Narrative:        Bethanne Ginger rollator, [employer] will change regular walker for bari rollator    Expected Discharge Plan and Services     Discharge Planning Services: CM Consult Post Acute Care Choice: Home Health, Durable Medical Equipment Living arrangements for the past 2 months: Apartment                 DME Arranged: Tub bench DME Agency: [employer] Date DME Agency Contacted: 02/05/19 Time DME Agency Contacted: 52 Representative spoke with at DME Agency: Zack             Social Determinants of Health ([place]) Interventions    Readmission Risk Interventions No flowsheet data found.

## 2019-02-08 DIAGNOSIS — R509 Fever, unspecified: Secondary | ICD-10-CM

## 2019-02-08 LAB — CBC
HCT: 34.6 % — ABNORMAL LOW (ref 36.0–46.0)
Hemoglobin: 11.1 g/dL — ABNORMAL LOW (ref 12.0–15.0)
MCH: 29.4 pg (ref 26.0–34.0)
MCHC: 32.1 g/dL (ref 30.0–36.0)
MCV: 91.8 fL (ref 80.0–100.0)
Platelets: 205 10*3/uL (ref 150–400)
RBC: 3.77 MIL/uL — ABNORMAL LOW (ref 3.87–5.11)
RDW: 14.5 % (ref 11.5–15.5)
WBC: 13.9 10*3/uL — ABNORMAL HIGH (ref 4.0–10.5)
nRBC: 0 % (ref 0.0–0.2)

## 2019-02-08 LAB — BASIC METABOLIC PANEL
Anion gap: 14 (ref 5–15)
BUN: 9 mg/dL (ref 6–20)
CO2: 20 mmol/L — ABNORMAL LOW (ref 22–32)
Calcium: 8 mg/dL — ABNORMAL LOW (ref 8.9–10.3)
Chloride: 102 mmol/L (ref 98–111)
Creatinine, Ser: 0.61 mg/dL (ref 0.44–1.00)
GFR calc Af Amer: >60 mL/min (ref >60)
GFR calc non Af Amer: >60 mL/min (ref >60)
Glucose, Bld: 70 mg/dL (ref 70–99)
Potassium: 4 mmol/L (ref 3.5–5.1)
Sodium: 136 mmol/L (ref 135–145)

## 2019-02-08 LAB — URINE CULTURE: Culture: >=100000 — AB

## 2019-02-08 NOTE — Progress Notes (Signed)
Family Medicine Teaching Service Daily Progress Note Intern Pager: [phone]  Patient name: Kelsey Bullock Medical record number: [id number] Date of birth: 05-Apr-1961 Age: 58 y.o. Gender: female  Primary Care Provider: [name], MD Consultants: Surgery Code Status: Full  Pt Overview and Major Events to Date:  8/30-admitted to FPTS for small bowel obstruction  8/31 -exploratory laparotomy performed 15 cm small bowel resection   Assessment and Plan: CHIRSTINA Bullock is a 58 y.o. female with hx of asthma, obesity, allergic rhinitis, CHF, GERD, gall stones and depression admitted with abdomin pain for several days prior to admission s/p small bowel resection.   Small bowel obstruction s/p small bowel resection for ischemic bowel  General surgery following. Postop day 5 after 15 cm small bowel resection.  [place] continues to have abdominal pain this morning.  No bowel function yet but has some flatulence. KUB yesterday showed non-obstructive gas pattern. NG tube output is decreasing to 250 mL.  Per surgery will clamp NG tube today and if no nausea or vomiting can remove NG tomorrow. JP drain output is minimal output.   -Continue NPO, ice chips allowed, oral care swabs  -Continue NG tube, to be clamped today  -IV Zofran -IV fluids  -Lovenox 80 mg  -Morphine PRN for pain -PT recommends home health PT, bariatric tub bench and bariatric rollator  -Trend electrolytes   Postop Fever Patient is postop day 5.Patient has been afebrile since more than 36 hour after spiking a fever of 102 F on 9/3 POD 2.   Initial blood culture showed likely contaminant.  Urine culture  positive for E. Coli.  UA indicated positive nitrites with hematuria, ketones proteinuria with 6-10 squamous epithelial. Patient denies dysuria. Per surgery notes abdominal incision is clean dry and intact.  Likely source of fever is inflammatory in nature. Discontinue Flagyl and levofloxacin today.  Patient received 3 doses  of Levaquin to cover for possible UTI.  Urine output is normal.  -Discontinue broad-spectrum antibiotics (Levaquin 750 mg QD / Flagyl 500mg  Q8H,  Day 3), -PRN Tylenol suppository q4h -PRN ASA suppository [id number]   [id number] Diastolic HFpEF -Echo [id number] LVEF 60-65% -No home Lasix although 40 mg prescribed  -Does not return appear to be fluid overloaded at this time  GERD -Home meds include tums, pepcid, priolosec.  Patient reports ongoing reflux-like symptoms. - IV famotidine -Hold home meds for now. NPO  Asthma  -Continue albuterol nebulizer and dulera -Hold omalizumab, duoneb, atrovent, qvar  while NPO  Severe Morbid Obesity She has been trying to lose weight over the past year and reports that she is lost about 100 pounds intentionally.  - weight: 164.6kg (362 lbs), BMI 62.3    Allergic rhinitis  -Continue flonase -Hold PO home meds zyrtec, singular, pataday  Depression - hold home meds for now Klonipin 1mg , Remeron   - CIWA scoring  OSA -Patient unable to wear CPAP due to NG tube  Chronic back pain -hold home med Fexeril 10mg  for now while NPO  Protein Calorie Malnutrition Acute on chronic moderate protein calorie malnutrition   FEN/GI: NPO, mIVF LR 150 mL/hr  PPx: Lovenox 80 mg  Disposition: Pending surgical clearance,  Likely d/c home to stay with sister [name])    Subjective:  Kelsey Bullock  Remains afebrile vital signs stable.  No significant overnight events.  She used to have abdominal pain and nausea.  No stools.  Reports some flatulence.     Objective: Temp:  [98.2 F (36.8 C)-98.8  F (37.1 C)] 98.4 F (36.9 C) (09/05 0434) Pulse Rate:  [82-104] 82 (09/05 0434) Resp:  [19-22] 20 (09/05 0827) BP: (117-150)/(64-73) 124/64 (09/05 0434) SpO2:  [92 %-96 %] 95 % (09/05 0434)    GEN: Talkative and in better spirits than yesterday, non-ill-appearing in no acute distress CV: Regular rate and rhythm, distant heart sounds due to body  habitus RESP: No increased work of breathing, lungs fields clear bilaterally ABD: Obese abdomen with generalized tenderness and good bowel sounds JP drain, surgical wound dressing dry SKIN: Warm, dry, no cyanosis on lower extremities NEURO: Alert and oriented, speech normal    Laboratory: Recent Labs  Lab 02/06/19 0257 02/07/19 0211 02/08/19 0250  WBC 12.9* 13.5* 13.9*  HGB 11.6* 11.1* 11.1*  HCT 35.6* 34.5* 34.6*  PLT 207 212 205   Recent Labs  Lab 02/02/19 1810  02/06/19 0257 02/07/19 0211 02/08/19 0250  NA 133*   < > 140 137 136  K 3.8   < > 3.4* 4.4 4.0  CL 97*   < > 100 100 102  CO2 21*   < > 30 26 20*  BUN 6   < > 11 10 9   CREATININE 0.72   < > 0.68 0.60 0.61  CALCIUM 9.5   < > 8.2* 7.8* 8.0*  PROT 8.1  --   --   --   --   BILITOT 1.9*  --   --   --   --   ALKPHOS 82  --   --   --   --   ALT 20  --   --   --   --   AST 18  --   --   --   --   GLUCOSE 116*   < > 126* 80 70   < > = values in this interval not displayed.   Hemoglobin A1c 6.1 Lactic acid-1.6  Imaging/Diagnostic Tests: Dg Chest 2 View  Result Date: 02/06/2019 CLINICAL DATA:  Fever.  CHF.  Asthma. EXAM: CHEST - 2 VIEW COMPARISON:  07/26/2010 FINDINGS: Both views are degraded secondary to technique and patient body habitus. The lateral view is essentially nondiagnostic. Frontal view is oblique to the right. Heart is likely mildly enlarged. Limited evaluation for pleural fluid. No pneumothorax. Low lung volumes with resultant pulmonary interstitial prominence. The right lung is grossly clear. Possible left lower lobe airspace disease versus overlying soft tissues. IMPRESSION: Moderate to markedly degraded exam, as detailed above. Possible left lower lobe airspace disease/pneumonia. Electronically Signed   By: Abigail Miyamoto M.D.   On: 02/06/2019 18:05   Dg Abdomen 1 View  Result Date: 02/02/2019 CLINICAL DATA:  NG tube placement. EXAM: ABDOMEN - 1 VIEW COMPARISON:  None. FINDINGS: An NG tube is identified  with tip overlying the distal stomach. IMPRESSION: NG tube tip overlying the distal stomach. Electronically Signed   By: Margarette Canada M.D.   On: 02/02/2019 21:54   Ct Abdomen Pelvis W Contrast  Result Date: 02/02/2019 CLINICAL DATA:  58 year old female with acute abdominal and pelvic pain. EXAM: CT ABDOMEN AND PELVIS WITH CONTRAST TECHNIQUE: Multidetector CT imaging of the abdomen and pelvis was performed using the standard protocol following bolus administration of intravenous contrast. CONTRAST:  124mL OMNIPAQUE IOHEXOL 300 MG/ML  SOLN COMPARISON:  None. FINDINGS: Lower chest: No acute abnormality Hepatobiliary: The liver and gallbladder are unremarkable. No biliary dilatation. Pancreas: Unremarkable Spleen: Unremarkable Adrenals/Urinary Tract: Kidneys, adrenal glands and bladder are unremarkable except for a probable RIGHT renal  cyst. Stomach/Bowel: A large infraumbilical hernia containing small bowel loops is identified. Dilated small bowel loops proximal to this hernia noted compatible with small bowel obstruction. There is some fluid within the terminal ileum and gas within the colon. No bowel wall thickening identified. Vascular/Lymphatic: Mild aortic atherosclerosis. No enlarged abdominal or pelvic lymph nodes. Reproductive: Uterus and bilateral adnexa are unremarkable except for probable small calcified fibroid. Other: Trace amount of free fluid within pelvis is noted. No evidence of abscess or pneumoperitoneum. Musculoskeletal: No acute or suspicious bony abnormalities. IMPRESSION: 1. Large infraumbilical hernia containing small bowel loops and causing small bowel obstruction. Trace amount of free pelvic fluid. No evidence of abscess or pneumoperitoneum. 2.  Aortic Atherosclerosis (ICD10-I70.0). Electronically Signed   By: Margarette Canada M.D.   On: 02/02/2019 20:04   Dg Abd Portable 1v  Result Date: 02/07/2019 CLINICAL DATA:  Abdominal pain s/p SBR and ventral hernia repair. ''Very large body  habitus-with lots of extra on right abdomen'' EXAM: PORTABLE ABDOMEN - 1 VIEW COMPARISON:  02/02/2019 FINDINGS: Nasogastric tube is in place. There is mild gaseous distension of colonic loops, without significant dilatation. No evidence for small bowel dilatation. Surgical clips and a surgical drain overlies the RIGHT hip. No evidence for free intraperitoneal air. IMPRESSION: 1. Nasogastric tube in place. 2. Nonobstructive bowel gas pattern. Electronically Signed   By: Nolon Nations M.D.   On: 02/07/2019 14:28   Lyndee Hensen, MD 02/08/2019, 8:43 AM   PGY-1, [place] Intern pager: [phone], text pages welcome

## 2019-02-08 NOTE — Progress Notes (Signed)
Patient ID: Kelsey Bullock, female   DOB: 1960/10/14, 58 y.o.   MRN: [id number]    5 Days Post-Op  Subjective: Feeling "a little miserable" today.  Nothing acute, just tired and sore.  Denies nausea.  Having some flatus.  NGT output still bilious.    Objective: Vital signs in last 24 hours: Temp:  [98.2 F (36.8 C)-98.8 F (37.1 C)] 98.4 F (36.9 C) (09/05 0434) Pulse Rate:  [82-104] 82 (09/05 0434) Resp:  [19-22] 20 (09/05 0827) BP: (117-150)/(64-73) 124/64 (09/05 0434) SpO2:  [92 %-96 %] 95 % (09/05 0434) Last BM Date: 01/30/19  Intake/Output from previous day: 09/04 0701 - 09/05 0700 In: 650 [I.V.:450; IV Piggyback:200] Out: 850 [Urine:500; Emesis/NG output:350] Intake/Output this shift: No intake/output data recorded.  PE: gen- alert and oriented.  Looks uncomfortable.   Abd:  appropriately tender, incision is c/d/i with staples in place.  No erythema or evidence of infection around incision.  JP drain with serosang output.  Output not recorded today.  Around 20 Ml in bulb now.  Lab Results:  Recent Labs    02/07/19 0211 02/08/19 0250  WBC 13.5* 13.9*  HGB 11.1* 11.1*  HCT 34.5* 34.6*  PLT 212 205   BMET Recent Labs    02/07/19 0211 02/08/19 0250  NA 137 136  K 4.4 4.0  CL 100 102  CO2 26 20*  GLUCOSE 80 70  BUN 10 9  CREATININE 0.60 0.61  CALCIUM 7.8* 8.0*   PT/INR No results for input(s): LABPROT, INR in the last 72 hours. CMP     Component Value Date/Time   NA 136 02/08/2019 0250   NA 143 01/18/2017 1028   K 4.0 02/08/2019 0250   CL 102 02/08/2019 0250   CO2 20 (L) 02/08/2019 0250   GLUCOSE 70 02/08/2019 0250   BUN 9 02/08/2019 0250   BUN 11 01/18/2017 1028   CREATININE 0.61 02/08/2019 0250   CREATININE 0.64 08/26/2015 0900   CALCIUM 8.0 (L) 02/08/2019 0250   PROT 8.1 02/02/2019 1810   PROT 7.2 10/16/2016 1006   ALBUMIN 4.0 02/02/2019 1810   ALBUMIN 4.5 10/16/2016 1006   AST 18 02/02/2019 1810   ALT 20 02/02/2019 1810   ALKPHOS 82  02/02/2019 1810   BILITOT 1.9 (H) 02/02/2019 1810   BILITOT 0.4 10/16/2016 1006   GFRNONAA >60 02/08/2019 0250   GFRNONAA >89 08/26/2015 0900   GFRAA >60 02/08/2019 0250   GFRAA >89 08/26/2015 0900   Lipase     Component Value Date/Time   LIPASE 22 02/02/2019 1810       Studies/Results: Dg Chest 2 View  Result Date: 02/06/2019 CLINICAL DATA:  Fever.  CHF.  Asthma. EXAM: CHEST - 2 VIEW COMPARISON:  07/26/2010 FINDINGS: Both views are degraded secondary to technique and patient body habitus. The lateral view is essentially nondiagnostic. Frontal view is oblique to the right. Heart is likely mildly enlarged. Limited evaluation for pleural fluid. No pneumothorax. Low lung volumes with resultant pulmonary interstitial prominence. The right lung is grossly clear. Possible left lower lobe airspace disease versus overlying soft tissues. IMPRESSION: Moderate to markedly degraded exam, as detailed above. Possible left lower lobe airspace disease/pneumonia. Electronically Signed   By: Abigail Miyamoto M.D.   On: 02/06/2019 18:05   Dg Abd Portable 1v  Result Date: 02/07/2019 CLINICAL DATA:  Abdominal pain s/p SBR and ventral hernia repair. ''Very large body habitus-with lots of extra on right abdomen'' EXAM: PORTABLE ABDOMEN - 1 VIEW COMPARISON:  02/02/2019  FINDINGS: Nasogastric tube is in place. There is mild gaseous distension of colonic loops, without significant dilatation. No evidence for small bowel dilatation. Surgical clips and a surgical drain overlies the RIGHT hip. No evidence for free intraperitoneal air. IMPRESSION: 1. Nasogastric tube in place. 2. Nonobstructive bowel gas pattern. Electronically Signed   By: Nolon Nations M.D.   On: 02/07/2019 14:28    Anti-infectives: Anti-infectives (From admission, onward)   Start     Dose/Rate Route Frequency Ordered Stop   02/06/19 1745  metroNIDAZOLE (FLAGYL) IVPB 500 mg  Status:  Discontinued     500 mg 100 mL/hr over 60 Minutes Intravenous  Every 8 hours 02/06/19 1739 02/08/19 0812   02/06/19 1745  levofloxacin (LEVAQUIN) IVPB 750 mg     750 mg 100 mL/hr over 90 Minutes Intravenous Every 24 hours 02/06/19 1744 02/09/19 1744   02/03/19 0900  vancomycin (VANCOCIN) 1,500 mg in sodium chloride 0.9 % 500 mL IVPB     1,500 mg 250 mL/hr over 120 Minutes Intravenous  Once 02/03/19 0803 02/03/19 1048       Assessment/Plan Severe obesity Hypertension Hyperlipidemia PreDiabetes Obstructive sleep apnea New onset dizziness - defer to medicine, hgb is stable Hypokalemia - resolved  POD5, s/p open primary ventral hernia repair with SBR for ischemic bowel, Dr. Georgette Dover 8/31 -plain film non obstructive, but output of ngt still bilious.  Will try clamping NGT.  If no n/v, can d/c tomorrow.   -CXR yesterday questions a PNA, primary started patient on levaquin -add pepcid for gastric protection given bloody output noted in NGT, this has resolved -mobilize -IS and pulm toilet -cont JP drain for now.  Follow output.  Hope to d/c drain prior to d/c.    FEN -clamp ngt.  Hope to d/c tomorrow and advance diet.   GI - may need enema if no more action by tomorrow.   VTE -Lovenox, SCDs ID -vanc pre-op surgical ppx, levaquin for pneumonia 9/3 -->   LOS: 6 days    Milus Height, MD FACS Surgical Oncology, General Surgery, Trauma and [place] [facility], [place] [phone] Check [email or web address], password [facility] for coverage night/weekend

## 2019-02-08 NOTE — Progress Notes (Signed)
PHARMACY - PHYSICIAN COMMUNICATION CRITICAL VALUE ALERT - BLOOD CULTURE IDENTIFICATION (BCID)  Kelsey Bullock is an 58 y.o. female who presented to [facility] on 02/02/2019 with a chief complaint of SBO.  Assessment:  1/2 bottles returns with coag-negative staph, likely contaminant.  Name of physician (or Provider) Contacted: Dr Enid Derry  Current antibiotics: Levaquin and Flagyl  Changes to prescribed antibiotics recommended:  No changes needed.  Results for orders placed or performed during the hospital encounter of 02/02/19  Blood Culture ID Panel (Reflexed) (Collected: 02/06/2019  3:37 PM)  Result Value Ref Range   Enterococcus species NOT DETECTED NOT DETECTED   Listeria monocytogenes NOT DETECTED NOT DETECTED   Staphylococcus species DETECTED (A) NOT DETECTED   Staphylococcus aureus (BCID) NOT DETECTED NOT DETECTED   Methicillin resistance NOT DETECTED NOT DETECTED   Streptococcus species NOT DETECTED NOT DETECTED   Streptococcus agalactiae NOT DETECTED NOT DETECTED   Streptococcus pneumoniae NOT DETECTED NOT DETECTED   Streptococcus pyogenes NOT DETECTED NOT DETECTED   Acinetobacter baumannii NOT DETECTED NOT DETECTED   Enterobacteriaceae species NOT DETECTED NOT DETECTED   Enterobacter cloacae complex NOT DETECTED NOT DETECTED   Escherichia coli NOT DETECTED NOT DETECTED   Klebsiella oxytoca NOT DETECTED NOT DETECTED   Klebsiella pneumoniae NOT DETECTED NOT DETECTED   Proteus species NOT DETECTED NOT DETECTED   Serratia marcescens NOT DETECTED NOT DETECTED   Haemophilus influenzae NOT DETECTED NOT DETECTED   Neisseria meningitidis NOT DETECTED NOT DETECTED   Pseudomonas aeruginosa NOT DETECTED NOT DETECTED   Candida albicans NOT DETECTED NOT DETECTED   Candida glabrata NOT DETECTED NOT DETECTED   Candida krusei NOT DETECTED NOT DETECTED   Candida parapsilosis NOT DETECTED NOT DETECTED   Candida tropicalis NOT DETECTED NOT DETECTED    Wynona Neat, PharmD, BCPS   02/08/2019  12:11 AM

## 2019-02-09 LAB — COMPREHENSIVE METABOLIC PANEL
ALT: 16 U/L (ref 0–44)
AST: 18 U/L (ref 15–41)
Albumin: 1.9 g/dL — ABNORMAL LOW (ref 3.5–5.0)
Alkaline Phosphatase: 76 U/L (ref 38–126)
Anion gap: 13 (ref 5–15)
BUN: 8 mg/dL (ref 6–20)
CO2: 23 mmol/L (ref 22–32)
Calcium: 7.8 mg/dL — ABNORMAL LOW (ref 8.9–10.3)
Chloride: 100 mmol/L (ref 98–111)
Creatinine, Ser: 0.68 mg/dL (ref 0.44–1.00)
GFR calc Af Amer: >60 mL/min (ref >60)
GFR calc non Af Amer: >60 mL/min (ref >60)
Glucose, Bld: 80 mg/dL (ref 70–99)
Potassium: 3.5 mmol/L (ref 3.5–5.1)
Sodium: 136 mmol/L (ref 135–145)
Total Bilirubin: 2.6 mg/dL — ABNORMAL HIGH (ref 0.3–1.2)
Total Protein: 5.4 g/dL — ABNORMAL LOW (ref 6.5–8.1)

## 2019-02-09 LAB — CULTURE, BLOOD (ROUTINE X 2): Special Requests: ADEQUATE

## 2019-02-09 LAB — CBC
HCT: 34 % — ABNORMAL LOW (ref 36.0–46.0)
Hemoglobin: 10.7 g/dL — ABNORMAL LOW (ref 12.0–15.0)
MCH: 28.5 pg (ref 26.0–34.0)
MCHC: 31.5 g/dL (ref 30.0–36.0)
MCV: 90.7 fL (ref 80.0–100.0)
Platelets: 237 10*3/uL (ref 150–400)
RBC: 3.75 MIL/uL — ABNORMAL LOW (ref 3.87–5.11)
RDW: 14.5 % (ref 11.5–15.5)
WBC: 12.1 10*3/uL — ABNORMAL HIGH (ref 4.0–10.5)
nRBC: 0 % (ref 0.0–0.2)

## 2019-02-09 MED ORDER — OXYCODONE HCL 5 MG PO TABS
5.0000 mg | ORAL_TABLET | Freq: Four times a day (QID) | ORAL | Status: DC | PRN
  Administered 2019-02-10 (×2): 5 mg via ORAL
  Filled 2019-02-09 (×2): qty 1

## 2019-02-09 MED ORDER — METHOCARBAMOL 500 MG PO TABS
500.0000 mg | ORAL_TABLET | Freq: Four times a day (QID) | ORAL | Status: DC | PRN
  Administered 2019-02-10: 500 mg via ORAL
  Filled 2019-02-09: qty 1

## 2019-02-09 MED ORDER — MORPHINE SULFATE (PF) 2 MG/ML IV SOLN
1.0000 mg | INTRAVENOUS | Status: DC | PRN
  Administered 2019-02-09 – 2019-02-10 (×2): 2 mg via INTRAVENOUS
  Filled 2019-02-09 (×2): qty 1

## 2019-02-09 MED ORDER — ACETAMINOPHEN 325 MG PO TABS
650.0000 mg | ORAL_TABLET | Freq: Four times a day (QID) | ORAL | Status: DC | PRN
  Administered 2019-02-09 – 2019-02-10 (×4): 650 mg via ORAL
  Filled 2019-02-09 (×4): qty 2

## 2019-02-09 MED ORDER — FAMOTIDINE 20 MG PO TABS
20.0000 mg | ORAL_TABLET | Freq: Two times a day (BID) | ORAL | Status: DC
  Administered 2019-02-09 – 2019-02-11 (×5): 20 mg via ORAL
  Filled 2019-02-09 (×5): qty 1

## 2019-02-09 NOTE — Progress Notes (Signed)
Asked patient about using CPAP tonight.  Patient stated she cannot use one.  She states that she gets to coughing and that they take her breath.  She said that was what happened to her when she had the study done and she told them until they made a medicine that helped her, she could not use it.  No distress noted.  Will continue to monitor.

## 2019-02-09 NOTE — Progress Notes (Signed)
NGT d/c'd. Patient tolerated well Clear liquid diet to start.

## 2019-02-09 NOTE — Progress Notes (Signed)
Family Medicine Teaching Service Daily Progress Note Intern Pager: [phone]  Patient name: Kelsey Bullock Medical record number: [id number] Date of birth: [birth year] Age: 58 y.o. Gender: female  Primary Care Provider: [name], MD Consultants: Surgery Code Status: Full  Pt Overview and Major Events to Date:  8/30-admitted to FPTS for small bowel obstruction  8/31 -exploratory laparotomy performed 15 cm small bowel resection   Assessment and Plan: Kelsey Bullock is a 58 y.o. female with hx of asthma, obesity, allergic rhinitis, CHF, GERD, gall stones and depression admitted with abdomin pain for several days prior to admission s/p small bowel resection.   POD#6 s/p ventral hernia repair for ventral hernia with incarcerated small bowel. - improved Patient endorses feeling improved pain today but tired since she was up most of the night with anxiety for the IV team coming. NG tube in place and suction has been discontinued last night. Patient has had 3 episodes of passing gas, some loose stool released with the gas. She is urinating without difficulty but states the urine is red. She notes that she does not have much of an appetite so doesn't know if she will be able to trial a PO diet today but she has been able to tolerate ice chips.  Serosanguinous in drain and approximately 20cc overnight. Surgical site appears non-infectious. hgb essentially stable today at 10.7. vitals stable -Continue NPO, ice chips allowed, oral care swabs, advance diet per GS and will potentially be able to remove NG tube today if able to tolerate some PO diet -IV Zofran -IV fluids  -Lovenox 80 mg  -Morphine PRN for pain -PT recommends home health PT, bariatric tub bench and bariatric rollator  -Trend electrolytes - nystatin for pannus  UTI- completed antibiotic course. Denies dysuria. Endorses having red urine. Hgb 10.7 today. Urine output approximately 1 liter yesterday. -continue IV fluids per  surgery (currently 168mL/hr LR)  - deescalate as tolerating PO diet  [id number] Diastolic HFpEF- Echo [id number] LVEF 60-65%. Patient endorses difficulty with breathing when laying flat on back- potentially due to body habitus and/or HF. Patient under impression that this is due to her asthma.  - monitor vitals per floor protocol  - follow up outpatient and consider repeat echo and/or stress test  GERD- currently asymptomatic - continue IV famotidine - transition to PO home meds as possible with diet  Asthma- well controlled -Continue albuterol nebulizer and dulera -Hold omalizumab, duoneb, atrovent, qvar  while NPO  Severe Morbid Obesity- on admission weight: 164.6kg (362 lbs), BMI 62.3   Allergic rhinitis  -Continue flonase -Hold PO home meds zyrtec, singular, pataday  Depression- CIWA 0 overnight - hold home meds for now Klonipin 1mg , Remeron   - CIWA scoring  OSA -Patient unable to wear CPAP due to NG tube. Restart as able  Chronic back pain- stable -hold home med Fexeril 10mg  for now while NPO  Protein Calorie Malnutrition Acute on chronic moderate protein calorie malnutrition   FEN/GI: NPO, mIVF LR 150 mL/hr  PPx: Lovenox 80 mg  Disposition: Pending surgical clearance,  Likely d/c home to stay with sister [name])   Subjective:  Kelsey Bullock  Feels well overall today. Had gas passing with scant stool amount. Denies dysuria but complains of red color to urine. Feels that she is tolerating ice chips well but does not have much of an appetite for advancing diet purposes today. Endorses just minimal abdominal pain. She was up most of the night with anxiety over the  IV team coming to restart fluids.   Objective: Temp:  [97.8 F (36.6 C)-98.9 F (37.2 C)] 98.5 F (36.9 C) (09/06 0458) Pulse Rate:  [81-87] 81 (09/06 0458) Resp:  [18-20] 20 (09/06 0458) BP: (129-131)/(66-75) 131/66 (09/06 0458) SpO2:  [94 %-97 %] 94 % (09/06 0458)    GEN: pleasant, obese,  resting comfortably CV: Regular rate and rhythm, distant heart sounds due to body habitus RESP: No increased work of breathing, lungs fields clear bilaterally ABD: Obese abdomen with generalized tenderness and good bowel sounds JP drain, surgical wound dressing dry SKIN: Warm, dry, no cyanosis on lower extremities NEURO: Alert and oriented, speech normal  Laboratory: Recent Labs  Lab 02/07/19 0211 02/08/19 0250 02/09/19 0512  WBC 13.5* 13.9* 12.1*  HGB 11.1* 11.1* 10.7*  HCT 34.5* 34.6* 34.0*  PLT 212 205 237   Recent Labs  Lab 02/02/19 1810  02/07/19 0211 02/08/19 0250 02/09/19 0512  NA 133*   < > 137 136 136  K 3.8   < > 4.4 4.0 3.5  CL 97*   < > 100 102 100  CO2 21*   < > 26 20* 23  BUN 6   < > 10 9 8   CREATININE 0.72   < > 0.60 0.61 0.68  CALCIUM 9.5   < > 7.8* 8.0* 7.8*  PROT 8.1  --   --   --  5.4*  BILITOT 1.9*  --   --   --  2.6*  ALKPHOS 82  --   --   --  76  ALT 20  --   --   --  16  AST 18  --   --   --  18  GLUCOSE 116*   < > 80 70 80   < > = values in this interval not displayed.   Hemoglobin A1c 6.1 Lactic acid-1.6  Imaging/Diagnostic Tests: None for past 24 hours  Richarda Osmond, DO 02/09/2019, 6:34 AM   PGY-2, [place] Intern pager: [phone], text pages welcome

## 2019-02-09 NOTE — Progress Notes (Signed)
PT refused Dulera and took home Albuterol inhaler instead.

## 2019-02-09 NOTE — Progress Notes (Signed)
6 Days Post-Op   Subjective/Chief Complaint: In chair, ng clamped without n/v now having bowel function   Objective: Vital signs in last 24 hours: Temp:  [97.8 F (36.6 C)-98.9 F (37.2 C)] 98.5 F (36.9 C) (09/06 0458) Pulse Rate:  [81-87] 81 (09/06 0458) Resp:  [18-20] 20 (09/06 0458) BP: (129-131)/(66-75) 131/66 (09/06 0458) SpO2:  [94 %-97 %] 94 % (09/06 0458) Last BM Date: 02/08/19  Intake/Output from previous day: 09/05 0701 - 09/06 0700 In: 3348.8 [I.V.:3098.8; IV Piggyback:250] Out: 910 [Urine:900; Drains:10] Intake/Output this shift: No intake/output data recorded.  gen- alert and oriented. NAD Abd:  appropriately tender, incision is c/d/i with staples in place.  No erythema or evidence of infection around incision.  JP drain with serosang output. Has some bs    Lab Results:  Recent Labs    02/08/19 0250 02/09/19 0512  WBC 13.9* 12.1*  HGB 11.1* 10.7*  HCT 34.6* 34.0*  PLT 205 237   BMET Recent Labs    02/08/19 0250 02/09/19 0512  NA 136 136  K 4.0 3.5  CL 102 100  CO2 20* 23  GLUCOSE 70 80  BUN 9 8  CREATININE 0.61 0.68  CALCIUM 8.0* 7.8*   PT/INR No results for input(s): LABPROT, INR in the last 72 hours. ABG No results for input(s): PHART, HCO3 in the last 72 hours.  Invalid input(s): PCO2, PO2  Studies/Results: Dg Abd Portable 1v  Result Date: 02/07/2019 CLINICAL DATA:  Abdominal pain s/p SBR and ventral hernia repair. ''Very large body habitus-with lots of extra on right abdomen'' EXAM: PORTABLE ABDOMEN - 1 VIEW COMPARISON:  02/02/2019 FINDINGS: Nasogastric tube is in place. There is mild gaseous distension of colonic loops, without significant dilatation. No evidence for small bowel dilatation. Surgical clips and a surgical drain overlies the RIGHT hip. No evidence for free intraperitoneal air. IMPRESSION: 1. Nasogastric tube in place. 2. Nonobstructive bowel gas pattern. Electronically Signed   By: Nolon Nations M.D.   On: 02/07/2019  14:28    Anti-infectives: Anti-infectives (From admission, onward)   Start     Dose/Rate Route Frequency Ordered Stop   02/06/19 1745  metroNIDAZOLE (FLAGYL) IVPB 500 mg  Status:  Discontinued     500 mg 100 mL/hr over 60 Minutes Intravenous Every 8 hours 02/06/19 1739 02/08/19 0812   02/06/19 1745  levofloxacin (LEVAQUIN) IVPB 750 mg     750 mg 100 mL/hr over 90 Minutes Intravenous Every 24 hours 02/06/19 1744 02/08/19 1609   02/03/19 0900  vancomycin (VANCOCIN) 1,500 mg in sodium chloride 0.9 % 500 mL IVPB     1,500 mg 250 mL/hr over 120 Minutes Intravenous  Once 02/03/19 0803 02/03/19 1048      Assessment/Plan: POD6 s/p open primary ventral hernia repair with SBR for ischemic bowel, Dr. Georgette Dover 8/31 -dc ng today, clear liquids, ileus appears resolved -CXR yesterday questions a PNA, primary started patient on levaquin -IS and pulm toilet -cont JP drain for now.  Follow output.  Hope to d/c drain prior to d/c.   FEN - dc ng clears VTE -Lovenox, SCDs ID -vanc pre-op surgical ppx, levaquin for pneumonia 9/3 -->  Rolm Bookbinder 02/09/2019

## 2019-02-10 MED ORDER — ALUM & MAG HYDROXIDE-SIMETH 200-200-20 MG/5ML PO SUSP
30.0000 mL | ORAL | Status: DC | PRN
  Administered 2019-02-10: 30 mL via ORAL
  Filled 2019-02-10: qty 30

## 2019-02-10 NOTE — Progress Notes (Signed)
Family Medicine Teaching Service Daily Progress Note Intern Pager: [phone]  Patient name: Kelsey Bullock Medical record number: [id number] Date of birth: [birth year] Age: 58 y.o. Gender: female  Primary Care Provider: [name], MD Consultants: Surgery Code Status: Full  Pt Overview and Major Events to Date:  8/30-admitted to FPTS for small bowel obstruction  8/31 -exploratory laparotomy performed 15 cm small bowel resection   Assessment and Plan: Kelsey Bullock is a 58 y.o. female with hx of asthma, obesity, allergic rhinitis, CHF, GERD, gall stones and depression admitted with abdomin pain for several days prior to admission s/p small bowel resection.   POD#7 s/p ventral hernia repair for ventral hernia with incarcerated small bowel. - improved She states she has passed flatus and had a bowel movement 2 days ago. She is having 10/10 abdominal pain but does not want to take any pain medicine due to the fear of becoming constipated. After discussing with the pain that tylenol is safe and should not cause constipation she has agreed to tylenol for pain relief.  -Thin liquid diet, NG tube d/c -IV Zofran -IV fluids  -Lovenox 80 mg  -Morphine and tylenol 650 PRN for pain -PT recommends home health PT, bariatric tub bench and bariatric rollator  -Trend electrolytes - nystatin for pannus -Transition to PO meds when possible  UTI- completed antibiotic course. Denies dysuria. Endorses having red urine yesterday that has resolved today. Hgb 10.7 yesteday. Urine output 1.4 liter yesterday. -continue IV fluids per surgery (currently 142mL/hr LR) - deescalate as tolerating PO diet  [id number] Diastolic HFpEF- Echo [id number] LVEF 60-65%. Patient endorses difficulty with breathing when laying flat on back- potentially due to body habitus and/or HF. Patient under impression that this is due to her asthma.  - monitor vitals per floor protocol  - follow up outpatient and consider repeat  echo and/or stress test  GERD- currently asymptomatic - continue IV famotidine - transition to PO home meds as possible with diet  Asthma- well controlled -Continue albuterol nebulizer and dulera -Hold omalizumab, duoneb, atrovent, qvar  while NPO  Severe Morbid Obesity- on admission weight: 164.6kg (362 lbs), BMI 62.3.   Allergic rhinitis  -Continue flonase -Hold PO home meds zyrtec, singular, pataday  Depression- CIWA 0 overnight - hold home meds for now Klonipin 1mg , Remeron   - CIWA scoring 0 overnight  OSA -Restarting CPAP was discussed with patient and she prefers not to restart  Chronic back pain- stable -hold home med Fexeril 10mg  for now while NPO  Protein Calorie Malnutrition Acute on chronic moderate protein calorie malnutrition   FEN/GI: NPO, mIVF LR 150 mL/hr  PPx: Lovenox 80 mg  Disposition: Pending surgical clearance,  Likely d/c home to stay with sister Kelsey Bullock)   Subjective:  She states she has passed flatus and had a bowel movement 2 days ago. She is having 10/10 abdominal pain but does not want to take any pain medicine due to the fear of becoming constipated. After discussing with the pain that tylenol is safe and should not cause constipation she has agreed to tylenol for pain relief.    Objective: Temp:  [98.4 F (36.9 C)-98.8 F (37.1 C)] 98.8 F (37.1 C) (09/07 0414) Pulse Rate:  [80-85] 80 (09/07 0414) Resp:  [20-24] 20 (09/07 0414) BP: (121-133)/(67-71) 123/71 (09/07 0414) SpO2:  [94 %-95 %] 94 % (09/07 0414)    General: Appears well, no acute distress. Age appropriate. Cardiac: RRR, normal heart sounds, no murmurs Respiratory: CTAB, normal  effort Abdomen: soft, nontender, nondistended, NABS. JP drain present and draining erythematous fluid Extremities: No edema or cyanosis. Skin: Warm and dry, no rashes noted Neuro: alert and oriented, no focal deficits Psych: normal affect  Laboratory: Recent Labs  Lab 02/07/19 0211  02/08/19 0250 02/09/19 0512  WBC 13.5* 13.9* 12.1*  HGB 11.1* 11.1* 10.7*  HCT 34.5* 34.6* 34.0*  PLT 212 205 237   Recent Labs  Lab 02/07/19 0211 02/08/19 0250 02/09/19 0512  NA 137 136 136  K 4.4 4.0 3.5  CL 100 102 100  CO2 26 20* 23  BUN 10 9 8   CREATININE 0.60 0.61 0.68  CALCIUM 7.8* 8.0* 7.8*  PROT  --   --  5.4*  BILITOT  --   --  2.6*  ALKPHOS  --   --  76  ALT  --   --  16  AST  --   --  18  GLUCOSE 80 70 80   Hemoglobin A1c 6.1 Lactic acid-1.6  Imaging/Diagnostic Tests: None for past 24 hours  Kelsey Bullock, Kelsey Plummer, DO 02/10/2019, 2:30 PM   PGY-1, [place] Intern pager: [phone], text pages welcome

## 2019-02-10 NOTE — Progress Notes (Signed)
Patient refused CPAP tonight 

## 2019-02-10 NOTE — Progress Notes (Signed)
7 Days Post-Op   Subjective/Chief Complaint: Tolerating clears   Objective: Vital signs in last 24 hours: Temp:  [98.4 F (36.9 C)-98.8 F (37.1 C)] 98.8 F (37.1 C) (09/07 0414) Pulse Rate:  [80-85] 80 (09/07 0414) Resp:  [20-24] 20 (09/07 0414) BP: (121-133)/(67-71) 123/71 (09/07 0414) SpO2:  [94 %-95 %] 95 % (09/07 0900) Last BM Date: 02/08/19  Intake/Output from previous day: 09/06 0701 - 09/07 0700 In: 3487 [P.O.:840; I.V.:2597; IV Piggyback:50] Out: [id number] [Urine:1450; Drains:15] Intake/Output this shift: No intake/output data recorded.  gen- alert and oriented. NAD Abd:  appropriately tender, incision is c/d/i with staples in place.  No erythema or evidence of infection around incision.  JP drain with serosang output. Has some bs    Lab Results:  Recent Labs    02/08/19 0250 02/09/19 0512  WBC 13.9* 12.1*  HGB 11.1* 10.7*  HCT 34.6* 34.0*  PLT 205 237   BMET Recent Labs    02/08/19 0250 02/09/19 0512  NA 136 136  K 4.0 3.5  CL 102 100  CO2 20* 23  GLUCOSE 70 80  BUN 9 8  CREATININE 0.61 0.68  CALCIUM 8.0* 7.8*   PT/INR No results for input(s): LABPROT, INR in the last 72 hours. ABG No results for input(s): PHART, HCO3 in the last 72 hours.  Invalid input(s): PCO2, PO2  Studies/Results: No results found.  Anti-infectives: Anti-infectives (From admission, onward)   Start     Dose/Rate Route Frequency Ordered Stop   02/06/19 1745  metroNIDAZOLE (FLAGYL) IVPB 500 mg  Status:  Discontinued     500 mg 100 mL/hr over 60 Minutes Intravenous Every 8 hours 02/06/19 1739 02/08/19 0812   02/06/19 1745  levofloxacin (LEVAQUIN) IVPB 750 mg     750 mg 100 mL/hr over 90 Minutes Intravenous Every 24 hours 02/06/19 1744 02/08/19 1609   02/03/19 0900  vancomycin (VANCOCIN) 1,500 mg in sodium chloride 0.9 % 500 mL IVPB     1,500 mg 250 mL/hr over 120 Minutes Intravenous  Once 02/03/19 0803 02/03/19 1048      Assessment/Plan: POD7 s/p open primary  ventral hernia repair with SBR for ischemic bowel, Dr. Georgette Dover 8/31 -advance to soft diet -CXR 9/5 questions a PNA, primary started patient on levaquin -IS and pulm toilet -cont JP drain for now.  Follow output.  Hope to d/c drain prior to d/c.   FEN - advance to soft, stop IVF today VTE -Lovenox, SCDs ID -vanc pre-op surgical ppx, levaquin for pneumonia 9/3 -->  Possibly ready from surgery standpoint for DC tomorrow if tolerating soft diet, continues to have bowel function. Plan to dc drain before she leaves hospital.  Kelsey Bullock 02/10/2019

## 2019-02-10 NOTE — Progress Notes (Signed)
Physical Therapy Treatment Patient Details Name: Kelsey Bullock MRN: [id number] DOB: 11/19/1960 Today's Date: 02/10/2019    History of Present Illness Pt is a 58 y.o. female admitted 02/02/19 with diffuse abdominal pain. Pt with SBO herniated through an infraumbilical fascial defect. S/p exlap with small bowel resection and repair of ventral hernia. CXR 9/3- LLL PNA. PMH includes CHF, asthma, depression, morbid obesity.   PT Comments    Pt progressing well with mobility. Ambulatory with and without bariatric rollator at supervision-level. Continues to c/o fatigue, abdominal pain and SOB (due to mask) with minimal ambulation distance. Increased time educ re: importance of mobility and walking (shorter bouts of activity, more often throughout day). Has been doing some hallway ambulation with nursing staff. Hopeful for d/c home tomorrow; will have support from sister.   Follow Up Recommendations  Home health PT;Supervision - Intermittent     Equipment Recommendations  (bariatric rollator and tub bench delivered to room)    Recommendations for Other Services       Precautions / Restrictions Precautions Precautions: Fall;Other (comment) Precaution Comments: NGT, abdominal for comfort Restrictions Weight Bearing Restrictions: No    Mobility  Bed Mobility               General bed mobility comments: Received sitting in recliner  Transfers Overall transfer level: Needs assistance Equipment used: None;4-wheeled walker Transfers: Sit to/from Stand Sit to Stand: Modified independent (Device/Increase time)            Ambulation/Gait Ambulation/Gait assistance: Supervision Gait Distance (Feet): 200 Feet Assistive device: 4-wheeled walker Gait Pattern/deviations: Step-through pattern;Decreased stride length;Wide base of support Gait velocity: Decreased Gait velocity interpretation: <1.8 ft/sec, indicate of risk for recurrent falls General Gait Details: Slow ambulation  with rollator; supervision for safety. Multiple standing rest breaks with pt c/o fatigue, abdominal pain and SOB due to face mask. Educ re: rollator brakes/safety recs when sitting for rest (i.e. back rollator against wall since only two wheels lock)   Stairs Stairs: (Declined)           Wheelchair Mobility    Modified Rankin (Stroke Patients Only)       Balance   Sitting-balance support: Feet supported;No upper extremity supported Sitting balance-Leahy Scale: Good     Standing balance support: During functional activity Standing balance-Leahy Scale: Fair                              Cognition Arousal/Alertness: Awake/alert Behavior During Therapy: WFL for tasks assessed/performed Overall Cognitive Status: Within Functional Limits for tasks assessed                                        Exercises      General Comments General comments (skin integrity, edema, etc.): Increased time educ re: importance of mobility and increased aerobic activity (shorter bouts of activity/walking, more often throughout day)      Pertinent Vitals/Pain Pain Assessment: Faces Faces Pain Scale: Hurts little more Pain Location: Abdomen Pain Descriptors / Indicators: Sore;Grimacing;Guarding Pain Intervention(s): Monitored during session    Home Living                      Prior Function            PT Goals (current goals can now be found in the care plan section) Progress towards  PT goals: Progressing toward goals    Frequency    Min 3X/week      PT Plan Current plan remains appropriate    Co-evaluation              AM-PAC PT "6 Clicks" Mobility   Outcome Measure  Help needed turning from your back to your side while in a flat bed without using bedrails?: A Little Help needed moving from lying on your back to sitting on the side of a flat bed without using bedrails?: A Little Help needed moving to and from a bed to a chair  (including a wheelchair)?: None Help needed standing up from a chair using your arms (e.g., wheelchair or bedside chair)?: None Help needed to walk in hospital room?: None Help needed climbing 3-5 steps with a railing? : A Little 6 Click Score: 21    End of Session   Activity Tolerance: Patient tolerated treatment well Patient left: in chair;with call bell/phone within reach Nurse Communication: Mobility status PT Visit Diagnosis: Other abnormalities of gait and mobility (R26.89);Pain     Time: 1541-1601 PT Time Calculation (min) (ACUTE ONLY): 20 min  Charges:  $Gait Training: 8-22 mins                     Mabeline Caras, PT, DPT Acute Rehabilitation Services  Pager [phone] Office [place] 02/10/2019, 4:10 PM

## 2019-02-11 DIAGNOSIS — L8990 Pressure ulcer of unspecified site, unspecified stage: Secondary | ICD-10-CM | POA: Insufficient documentation

## 2019-02-11 LAB — CBC
HCT: 33.4 % — ABNORMAL LOW (ref 36.0–46.0)
Hemoglobin: 10.9 g/dL — ABNORMAL LOW (ref 12.0–15.0)
MCH: 29.1 pg (ref 26.0–34.0)
MCHC: 32.6 g/dL (ref 30.0–36.0)
MCV: 89.3 fL (ref 80.0–100.0)
Platelets: 266 10*3/uL (ref 150–400)
RBC: 3.74 MIL/uL — ABNORMAL LOW (ref 3.87–5.11)
RDW: 14.6 % (ref 11.5–15.5)
WBC: 9.3 10*3/uL (ref 4.0–10.5)
nRBC: 0 % (ref 0.0–0.2)

## 2019-02-11 LAB — COMPREHENSIVE METABOLIC PANEL
ALT: 18 U/L (ref 0–44)
AST: 20 U/L (ref 15–41)
Albumin: 1.8 g/dL — ABNORMAL LOW (ref 3.5–5.0)
Alkaline Phosphatase: 82 U/L (ref 38–126)
Anion gap: 9 (ref 5–15)
BUN: 8 mg/dL (ref 6–20)
CO2: 24 mmol/L (ref 22–32)
Calcium: 7.6 mg/dL — ABNORMAL LOW (ref 8.9–10.3)
Chloride: 103 mmol/L (ref 98–111)
Creatinine, Ser: 0.55 mg/dL (ref 0.44–1.00)
GFR calc Af Amer: >60 mL/min (ref >60)
GFR calc non Af Amer: >60 mL/min (ref >60)
Glucose, Bld: 139 mg/dL — ABNORMAL HIGH (ref 70–99)
Potassium: 3.3 mmol/L — ABNORMAL LOW (ref 3.5–5.1)
Sodium: 136 mmol/L (ref 135–145)
Total Bilirubin: 1.2 mg/dL (ref 0.3–1.2)
Total Protein: 5.3 g/dL — ABNORMAL LOW (ref 6.5–8.1)

## 2019-02-11 LAB — CULTURE, BLOOD (ROUTINE X 2)
Culture: NO GROWTH
Special Requests: ADEQUATE

## 2019-02-11 MED ORDER — OXYCODONE HCL 5 MG PO TABS: 5.0000 mg | ORAL_TABLET | Freq: Four times a day (QID) | ORAL | 0 refills | Status: DC | PRN

## 2019-02-11 MED ORDER — POTASSIUM CHLORIDE CRYS ER 20 MEQ PO TBCR
40.0000 meq | EXTENDED_RELEASE_TABLET | Freq: Once | ORAL | Status: AC
  Administered 2019-02-11: 40 meq via ORAL
  Filled 2019-02-11: qty 2

## 2019-02-11 MED ORDER — FAMOTIDINE 20 MG PO TABS: 20.0000 mg | ORAL_TABLET | Freq: Two times a day (BID) | ORAL | 3 refills | Status: DC

## 2019-02-11 MED ORDER — ACETAMINOPHEN 325 MG PO TABS: 650.0000 mg | ORAL_TABLET | Freq: Four times a day (QID) | ORAL | Status: DC | PRN

## 2019-02-11 MED ORDER — NYSTATIN 100000 UNIT/GM EX POWD: Freq: Three times a day (TID) | CUTANEOUS | 0 refills | Status: DC

## 2019-02-11 NOTE — Progress Notes (Signed)
Patient discharged to home with instructions given to patient's sister Collie Siad, all equipments sent with patient .

## 2019-02-11 NOTE — Progress Notes (Signed)
8 Days Post-Op    CC: Abdominal pain  Subjective: Patient is tolerating soft diet.  She is ambulating having bowel movements.  She normally has limited activity at home because of her osteoarthritis.  Her incision staple line looks good.  Objective: Vital signs in last 24 hours: Temp:  [98.4 F (36.9 C)-98.8 F (37.1 C)] 98.4 F (36.9 C) (09/08 0501) Pulse Rate:  [70-96] 70 (09/08 0501) Resp:  [16-18] 16 (09/08 0501) BP: (118-149)/(51-73) 118/68 (09/08 0501) SpO2:  [95 %-97 %] 95 % (09/08 0501) Last BM Date: 02/09/19 670 p.o. 1850 urine 100 drain BM x1 Afebrile vital signs are stable Potassium is 3.3 Glucose is 139 WBC 9.3 No films  Intake/Output from previous day: 09/07 0701 - 09/08 0700 In: 670 [P.O.:670] Out: 1950 [Urine:1850; Drains:100] Intake/Output this shift: No intake/output data recorded.  General appearance: alert, cooperative and no distress Resp: clear to auscultation bilaterally GI: Soft positive bowel sounds midline incision looks good.  Drain was removed.  Lab Results:  Recent Labs    02/09/19 0512 02/11/19 0459  WBC 12.1* 9.3  HGB 10.7* 10.9*  HCT 34.0* 33.4*  PLT 237 266    BMET Recent Labs    02/09/19 0512 02/11/19 0459  NA 136 136  K 3.5 3.3*  CL 100 103  CO2 23 24  GLUCOSE 80 139*  BUN 8 8  CREATININE 0.68 0.55  CALCIUM 7.8* 7.6*   PT/INR No results for input(s): LABPROT, INR in the last 72 hours.  Recent Labs  Lab 02/09/19 0512 02/11/19 0459  AST 18 20  ALT 16 18  ALKPHOS 76 82  BILITOT 2.6* 1.2  PROT 5.4* 5.3*  ALBUMIN 1.9* 1.8*     Lipase     Component Value Date/Time   LIPASE 22 02/02/2019 1810     Medications: . enoxaparin (LOVENOX) injection  0.5 mg/kg Subcutaneous Daily  . famotidine  20 mg Oral BID  . fluticasone  1 spray Each Nare Daily  . mouth rinse  15 mL Mouth Rinse BID  . mometasone-formoterol  2 puff Inhalation BID  . nystatin   Topical TID  . triamcinolone ointment   Topical BID   .  diphenhydrAMINE      Assessment/Plan Morbid obesity - BMI 70 Hypertension Hyperlipidemia PreDiabetes Obstructive sleep apnea   Super morbid obesity incarcerated ventral hernia with small bowel obstruction, Exploratory laparotomy, small bowel resection, primary repair of ventral hernia 02/03/19, DR. Donnie Mesa  FEN: Soft diet DVT: Lovenox Follow-up: Dr. Georgette Dover    Plan: Drain removed.  Dry dressing applied.  Midline incision is clean patient is instructed in care of her wound.  We will set her up to come back and get her staples out next week.  Follow-up with Dr. Georgette Dover in 2 to 3 weeks.  From a surgical standpoint she can go home when medically stable.  Follow-up appointments for staple removal and to see Dr. Georgette Dover are the AVS.  Discharge instructions are in the AVS.  Prescriptions for Tylenol and oxycodone are in the AVS.    LOS: 9 days    Kelsey Bullock 02/11/2019 [phone]

## 2019-02-11 NOTE — Discharge Instructions (Signed)
HERNIA REPAIR: POST OP INSTRUCTIONS Clean the staple line and the drain site with plain soap and water 2-3 times a day.  Keep sites clean and dry. ######################################################################  EAT Gradually transition to a high fiber diet with a fiber supplement over the next few weeks after discharge.  Start with a pureed / full liquid diet (see below)  WALK Walk an hour a day.  Control your pain to do that.    CONTROL PAIN Control pain so that you can walk, sleep, tolerate sneezing/coughing, and go up/down stairs.  HAVE A BOWEL MOVEMENT DAILY Keep your bowels regular to avoid problems.  OK to try a laxative to override constipation.  OK to use an antidairrheal to slow down diarrhea.  Call if not better after 2 tries  CALL IF YOU HAVE PROBLEMS/CONCERNS Call if you are still struggling despite following these instructions. Call if you have concerns not answered by these instructions  ######################################################################    1. DIET: Follow a light bland diet & liquids the first 24 hours after arrival home, such as soup, liquids, starches, etc.  Be sure to drink plenty of fluids.  Quickly advance to a usual solid diet within a few days.  Avoid fast food or heavy meals as your are more likely to get nauseated or have irregular bowels.  A low-fat, high-fiber diet for the rest of your life is ideal.   2. Take your usually prescribed home medications unless otherwise directed.  3. PAIN CONTROL: a. Pain is best controlled by a usual combination of three different methods TOGETHER: i. Ice/Heat ii. Over the counter pain medication iii. Prescription pain medication b. Most patients will experience some swelling and bruising around the hernia(s) such as the bellybutton, groins, or old incisions.  Ice packs or heating pads (30-60 minutes up to 6 times a day) will help. Use ice for the first few days to help decrease swelling and  bruising, then switch to heat to help relax tight/sore spots and speed recovery.  Some people prefer to use ice alone, heat alone, alternating between ice & heat.  Experiment to what works for you.  Swelling and bruising can take several weeks to resolve.   c. It is helpful to take an over-the-counter pain medication regularly for the first few weeks.  Choose one of the following that works best for you: i. Naproxen (Aleve, etc)  Two 220mg  tabs twice a day ii. Ibuprofen (Advil, etc) Three 200mg  tabs four times a day (every meal & bedtime) iii. Acetaminophen (Tylenol, etc) 325-650mg  four times a day (every meal & bedtime) d. A  prescription for pain medication should be given to you upon discharge.  Take your pain medication as prescribed.  i. If you are having problems/concerns with the prescription medicine (does not control pain, nausea, vomiting, rash, itching, etc), please call us [phone] to see if we need to switch you to a different pain medicine that will work better for you and/or control your side effect better. ii. If you need a refill on your pain medication, please contact your pharmacy.  They will contact our office to request authorization. Prescriptions will not be filled after 5 pm or on week-ends.  4. Avoid getting constipated.  Between the surgery and the pain medications, it is common to experience some constipation.  Increasing fluid intake and taking a fiber supplement (such as Metamucil, Citrucel, FiberCon, MiraLax, etc) 1-2 times a day regularly will usually help prevent this problem from occurring.  A mild laxative (  prune juice, Milk of Magnesia, MiraLax, etc) should be taken according to package directions if there are no bowel movements after 48 hours.    5. Wash / shower every day.  You may shower over the dressings as they are waterproof.    6. Remove your waterproof bandages, skin tapes, and other bandages 5 days after surgery. You may replace a dressing/Band-Aid to  cover the incision for comfort if you wish. You may leave the incisions open to air.  You may replace a dressing/Band-Aid to cover an incision for comfort if you wish.  Continue to shower over incision(s) after the dressing is off.  7. ACTIVITIES as tolerated:   a. You may resume regular (light) daily activities beginning the next day--such as daily self-care, walking, climbing stairs--gradually increasing activities as tolerated.  Control your pain so that you can walk an hour a day.  If you can walk 30 minutes without difficulty, it is safe to try more intense activity such as jogging, treadmill, bicycling, low-impact aerobics, swimming, etc. b. Save the most intensive and strenuous activity for last such as sit-ups, heavy lifting, contact sports, etc  Refrain from any heavy lifting or straining until you are off narcotics for pain control.   c. DO NOT PUSH THROUGH PAIN.  Let pain be your guide: If it hurts to do something, don't do it.  Pain is your body warning you to avoid that activity for another week until the pain goes down. d. You may drive when you are no longer taking prescription pain medication, you can comfortably wear a seatbelt, and you can safely maneuver your car and apply brakes. e. Dennis Bast may have sexual intercourse when it is comfortable.   8. FOLLOW UP in our office a. Please call [facility] at [phone] to set up an appointment to see your surgeon in the office for a follow-up appointment approximately 2-3 weeks after your surgery. b. Make sure that you call for this appointment the day you arrive home to insure a convenient appointment time.  9.  If you have disability of FMLA / Family leave forms, please bring the forms to the office for processing.  (do not give to your surgeon).  WHEN TO CALL us [phone]: 1. Poor pain control 2. Reactions / problems with new medications (rash/itching, nausea, etc)  3. Fever over 101.5 F (38.5 C) 4. Inability to urinate 5. Nausea  and/or vomiting 6. Worsening swelling or bruising 7. Continued bleeding from incision. 8. Increased pain, redness, or drainage from the incision   The clinic staff is available to answer your questions during regular business hours (8:30am-5pm).  Please dont hesitate to call and ask to speak to one of our nurses for clinical concerns.   If you have a medical emergency, go to the nearest emergency room or call 911.  A surgeon from [facility] is always on call at the hospitals in [facility], [place], [place], [place], [place]  [address] ?  [address] [address], [place], [place]   [address] MAIN: [phone] ? TOLL FREE: [phone] ? FAX: [phone] [email or web address]

## 2019-02-11 NOTE — TOC Transition Note (Signed)
Transition of Care [facility]) - CM/SW Discharge Note   Patient Details  Name: Kelsey Bullock MRN: [id number] Date of Birth: 07/19/1960  Transition of Care [facility]) CM/SW Contact:  Alexander Mt, [place] Phone Number: 02/11/2019, 10:23 AM   Clinical Narrative:    Ordered tub bench and bari rollator through [place] with [employer].  Joen Laura with [facility] has accepted HHPT referral.    Final next level of care: [place]     Patient Goals and CMS Choice Patient states their goals for this hospitalization and ongoing recovery are:: to go home CMS Medicare.gov Compare Post Acute Care list provided to:: Patient Choice offered to / list presented to : Patient  Discharge Placement Patient to be transferred to facility by: pt family Name of family member notified: pt A&O x4 will inform family    Discharge Plan and Services Discharge Planning Services: CM Consult Post Acute Care Choice: Home Health, Durable Medical Equipment          DME Arranged: Tub bench DME Agency: [employer] Date DME Agency Contacted: 02/05/19 Time DME Agency Contacted: [phone] Representative spoke with at DME Agency: [place] ([place]) Interventions     Readmission Risk Interventions No flowsheet data found.

## 2019-02-19 ENCOUNTER — Inpatient Hospital Stay: Payer: Medicaid Other | Admitting: Family Medicine

## 2019-02-19 ENCOUNTER — Ambulatory Visit: Payer: Self-pay

## 2019-02-20 DIAGNOSIS — Z48815 Encounter for surgical aftercare following surgery on the digestive system: Secondary | ICD-10-CM | POA: Diagnosis not present

## 2019-02-21 ENCOUNTER — Ambulatory Visit: Payer: Self-pay

## 2019-02-21 ENCOUNTER — Telehealth: Payer: Self-pay | Admitting: *Deleted

## 2019-02-21 NOTE — Telephone Encounter (Signed)
Verbal order given as requested. 

## 2019-02-21 NOTE — Telephone Encounter (Signed)
Flor from [employer] for PT verbal orders as follows:  1 time(s) weekly for 3 week(s)  You can leave verbal orders on confidential voicemail.  Christen Bame, CMA

## 2019-02-23 DIAGNOSIS — Z48815 Encounter for surgical aftercare following surgery on the digestive system: Secondary | ICD-10-CM | POA: Diagnosis not present

## 2019-02-24 ENCOUNTER — Telehealth: Payer: Self-pay | Admitting: *Deleted

## 2019-02-24 ENCOUNTER — Ambulatory Visit: Payer: Self-pay

## 2019-02-24 ENCOUNTER — Other Ambulatory Visit: Payer: Self-pay

## 2019-02-24 DIAGNOSIS — J4540 Moderate persistent asthma, uncomplicated: Secondary | ICD-10-CM | POA: Diagnosis not present

## 2019-02-24 NOTE — Telephone Encounter (Signed)
Verbal orders given as requested.  Patient was definitely a diabetic (never on meds).  She has lost considerable wt.  Latest A1C= 6.1 so now prediabetic

## 2019-02-24 NOTE — Telephone Encounter (Signed)
Tonya from [facility] calling for Kelsey Bullock orders as follows:  * 1 time(s) weekly for 8 week(s)  * Wet to dry bandages to abdomen every other day, placed by sister  * They also need to know if pt is diabetic or prediabetic.  Pt states that she is pre-diabetic.  You can leave verbal orders on confidential voicemail.  Christen Bame, CMA

## 2019-02-27 ENCOUNTER — Encounter: Payer: Self-pay | Admitting: Family Medicine

## 2019-02-27 ENCOUNTER — Other Ambulatory Visit: Payer: Self-pay

## 2019-02-27 ENCOUNTER — Ambulatory Visit (INDEPENDENT_AMBULATORY_CARE_PROVIDER_SITE_OTHER): Payer: Medicaid Other | Admitting: Family Medicine

## 2019-02-27 DIAGNOSIS — I10 Essential (primary) hypertension: Secondary | ICD-10-CM

## 2019-02-27 DIAGNOSIS — E8809 Other disorders of plasma-protein metabolism, not elsewhere classified: Secondary | ICD-10-CM | POA: Insufficient documentation

## 2019-02-27 DIAGNOSIS — Z48815 Encounter for surgical aftercare following surgery on the digestive system: Secondary | ICD-10-CM | POA: Diagnosis not present

## 2019-02-27 DIAGNOSIS — E46 Unspecified protein-calorie malnutrition: Secondary | ICD-10-CM | POA: Insufficient documentation

## 2019-02-27 DIAGNOSIS — T8131XD Disruption of external operation (surgical) wound, not elsewhere classified, subsequent encounter: Secondary | ICD-10-CM

## 2019-02-27 DIAGNOSIS — I5030 Unspecified diastolic (congestive) heart failure: Secondary | ICD-10-CM

## 2019-02-27 DIAGNOSIS — Z23 Encounter for immunization: Secondary | ICD-10-CM | POA: Diagnosis not present

## 2019-02-27 MED ORDER — FUROSEMIDE 40 MG PO TABS: 40.0000 mg | ORAL_TABLET | ORAL | 3 refills | Status: DC

## 2019-02-27 NOTE — Patient Instructions (Addendum)
I am pleased with your recovery. Remember the graph I drew for you.  Make it happen that your life getting better. I will call with blood test results. See me a week or two after the surgeon. Cut down your lasix to every other day.

## 2019-02-28 ENCOUNTER — Encounter: Payer: Self-pay | Admitting: Family Medicine

## 2019-02-28 DIAGNOSIS — T8131XD Disruption of external operation (surgical) wound, not elsewhere classified, subsequent encounter: Secondary | ICD-10-CM | POA: Insufficient documentation

## 2019-02-28 LAB — CMP14+EGFR
ALT: 18 IU/L (ref 0–32)
AST: 17 IU/L (ref 0–40)
Albumin/Globulin Ratio: 1.3 (ref 1.2–2.2)
Albumin: 3.9 g/dL (ref 3.8–4.9)
Alkaline Phosphatase: 112 IU/L (ref 39–117)
BUN/Creatinine Ratio: 14 (ref 9–23)
BUN: 10 mg/dL (ref 6–24)
Bilirubin Total: 0.8 mg/dL (ref 0.0–1.2)
CO2: 23 mmol/L (ref 20–29)
Calcium: 9 mg/dL (ref 8.7–10.2)
Chloride: 101 mmol/L (ref 96–106)
Creatinine, Ser: 0.72 mg/dL (ref 0.57–1.00)
GFR calc Af Amer: 108 mL/min/{1.73_m2} (ref >59)
GFR calc non Af Amer: 93 mL/min/{1.73_m2} (ref >59)
Globulin, Total: 2.9 g/dL (ref 1.5–4.5)
Glucose: 109 mg/dL — ABNORMAL HIGH (ref 65–99)
Potassium: 3.6 mmol/L (ref 3.5–5.2)
Sodium: 142 mmol/L (ref 134–144)
Total Protein: 6.8 g/dL (ref 6.0–8.5)

## 2019-02-28 LAB — CBC
Hematocrit: 38.9 % (ref 34.0–46.6)
Hemoglobin: 12.8 g/dL (ref 11.1–15.9)
MCH: 28.6 pg (ref 26.6–33.0)
MCHC: 32.9 g/dL (ref 31.5–35.7)
MCV: 87 fL (ref 79–97)
Platelets: 299 10*3/uL (ref 150–450)
RBC: 4.48 x10E6/uL (ref 3.77–5.28)
RDW: 13.4 % (ref 11.7–15.4)
WBC: 6.7 10*3/uL (ref 3.4–10.8)

## 2019-02-28 NOTE — Assessment & Plan Note (Signed)
Seems well controled and I am concerned about overdiuresis.  Will decrease lasix to qod.

## 2019-02-28 NOTE — Assessment & Plan Note (Signed)
Healing well.  Continue current treatment.

## 2019-02-28 NOTE — Assessment & Plan Note (Signed)
76 lb wt loss in 6 months.  Encourage calories for now for post op healing.

## 2019-02-28 NOTE — Assessment & Plan Note (Signed)
Despite morbid obesity, I am most concerned about acute malnutrition.  Encourage calories for now.  After wound healing, she can resume her prior diet which was resulting in good wt loss.

## 2019-02-28 NOTE — Progress Notes (Signed)
Established Patient Office Visit  Subjective:  Patient ID: Kelsey Bullock, female    DOB: 08/07/1960  Age: 58 y.o. MRN: [id number]  CC:  Chief Complaint  Patient presents with  . Hospitalization Follow-up    HPI Kelsey Bullock presents for multiple issues: 1. Wound.  With the help of her sister, she is doing twice daily dressing changes on the wound.  Has been seen by surgeon who is pleased with the progress.  Sister shows me a picture of the wound which shows clean margins and good granulation tissue.  The would is deep and will take weeks to heal.   2. Lightheadedness.  Feels weak and lightheaded on standing BP noted.  Note currently on any BP meds - except lasix, which she has cut back on her own to daily..  Wt loss continues. (see problem 3).  She is becoming progressively more active.  3. Protein calorie malnutrition.  Kelsey Bullock had drastically changed her diet months prior to surgery.  Weeks prior to surgery, she had problems with decreased appetite and early satiety.  Then she had a post op ileus post bowel resection.  Bottom line, had significant malnutrition marked by decrease albumin by the end of her hospital stay.  She is currently living with her sister, who is cooking nutrition meals.  Kelsey Bullock has continued wt loss but seems to be eating adaquate calories currently 4. Diastolic dysfunction CHF.  No DOE - although this is difficult to sort out because she is weak on exertion.   Past Medical History:  Diagnosis Date  . Allergic rhinitis   . Asthma   . Complication of anesthesia   . Congestive heart failure (CHF) ([place])   . Depression   . Gall stone   . GERD (gastroesophageal reflux disease)   . PONV (postoperative nausea and vomiting)     Past Surgical History:  Procedure Laterality Date  . ADENOIDECTOMY    . BIOPSY  05/27/2018   Procedure: BIOPSY;  Surgeon: Mauri Pole, MD;  Location: [facility];  Service: Endoscopy;;  . CESAREAN SECTION    . COLONOSCOPY  WITH PROPOFOL N/A 05/27/2018   Procedure: COLONOSCOPY WITH PROPOFOL;  Surgeon: Mauri Pole, MD;  Location: [facility];  Service: Endoscopy;  Laterality: N/A;  . CYST EXCISION    . EXPLORATORY LAPAROTOMY  02/03/2019  . INGUINAL HERNIA REPAIR N/A 02/03/2019   Procedure: EXPLORATORY LAPAROTOMY WITH SMALL BOWEL RESECTION AND PRIMARY CLOSURE OF VENTRAL HERNIA;  Surgeon: Donnie Mesa, MD;  Location: [facility];  Service: General;  Laterality: N/A;  . OVARIAN CYST REMOVAL    . POLYPECTOMY  05/27/2018   Procedure: POLYPECTOMY;  Surgeon: Mauri Pole, MD;  Location: [facility];  Service: Endoscopy;;  . removal of gallstones    . TONSILLECTOMY    . TYMPANOSTOMY TUBE PLACEMENT      Family History  Problem Relation Age of Onset  . Diabetes Mother   . Diabetes Paternal Uncle     Social History   Socioeconomic History  . Marital status: Divorced    Spouse name: Not on file  . Number of children: Not on file  . Years of education: Not on file  . Highest education level: Not on file  Occupational History  . Not on file  Social Needs  . Financial resource strain: Not on file  . Food insecurity    Worry: Not on file    Inability: Not on file  . Transportation needs    Medical: Not  on file    Non-medical: Not on file  Tobacco Use  . Smoking status: Never Smoker  . Smokeless tobacco: Never Used  Substance and Sexual Activity  . Alcohol use: No  . Drug use: No  . Sexual activity: Not Currently  Lifestyle  . Physical activity    Days per week: Not on file    Minutes per session: Not on file  . Stress: Not on file  Relationships  . Social Herbalist on phone: Not on file    Gets together: Not on file    Attends religious service: Not on file    Active member of club or organization: Not on file    Attends meetings of clubs or organizations: Not on file    Relationship status: Not on file  . Intimate partner violence    Fear of current or ex partner: Not on  file    Emotionally abused: Not on file    Physically abused: Not on file    Forced sexual activity: Not on file  Other Topics Concern  . Not on file  Social History Narrative  . Not on file    Outpatient Medications Prior to Visit  Medication Sig Dispense Refill  . acetaminophen (TYLENOL) 325 MG tablet Take 2 tablets (650 mg total) by mouth every 6 (six) hours as needed for mild pain, fever or headache.    . albuterol (PROVENTIL HFA;VENTOLIN HFA) 108 (90 Base) MCG/ACT inhaler INHALE 2 PUFFS INTO THE LUNGS EVERY 4 HOURS AS NEEDED FOR WHEEZING (Patient taking differently: Inhale 2 puffs into the lungs every 4 (four) hours as needed for wheezing or shortness of breath. ) 8.5 g 1  . albuterol (PROVENTIL) (2.5 MG/3ML) 0.083% nebulizer solution USE 1 VIAL VIA NEBULIZER EVERY 4 TO 6 HOURS AS NEEDED FOR COUGH OR WHEEZING (Patient taking differently: Take 2.5 mg by nebulization every 4 (four) hours as needed for wheezing or shortness of breath. ) 225 vial 1  . atorvastatin (LIPITOR) 40 MG tablet Take 1 tablet (40 mg total) by mouth at bedtime. 90 tablet 3  . beclomethasone (QVAR) 80 MCG/ACT inhaler INHALE 2 PUFFS BY MOUTH INTO THE LUNGS TWICE DAILY (Patient taking differently: Inhale 2 puffs into the lungs 2 (two) times daily as needed (sick). ) 8.7 g 3  . calcium carbonate (TUMS - DOSED IN MG ELEMENTAL CALCIUM) 500 MG chewable tablet Chew 2-3 tablets by mouth daily as needed for indigestion or heartburn.    Marland Kitchen CINNAMON PO Take 1 tablet by mouth daily as needed (for high bp).    . EPINEPHrine (EPIPEN 2-PAK) 0.3 mg/0.3 mL IJ SOAJ injection Inject 0.3 mLs (0.3 mg total) into the muscle once for 1 dose. (Patient taking differently: Inject 0.3 mg into the muscle once as needed for anaphylaxis. ) 2 Device 3  . famotidine (PEPCID) 20 MG tablet Take 1 tablet (20 mg total) by mouth 2 (two) times daily. 90 tablet 3  . fluticasone (FLONASE) 50 MCG/ACT nasal spray USE 2 SPRAYS IN EACH NOSTRIL ONCE DAILY FOR STUFFY  NOSE OR DRAINAGE (Patient taking differently: Place 2 sprays into both nostrils daily as needed for allergies or rhinitis. ) 16 g 2  . lisinopril (PRINIVIL,ZESTRIL) 40 MG tablet TAKE 1 TABLET BY MOUTH EVERY DAY (Patient taking differently: Take 40 mg by mouth daily. ) 90 tablet 3  . montelukast (SINGULAIR) 10 MG tablet TAKE 1 TABLET BY MOUTH AT BEDTIME (Patient taking differently: Take 10 mg by mouth at bedtime. )  30 tablet 5  . nystatin (MYCOSTATIN/NYSTOP) powder Apply topically 3 (three) times daily. 15 g 0  . Olopatadine HCl (PAZEO) 0.7 % SOLN Place 1 drop into both eyes daily as needed (allergies).    Marland Kitchen omeprazole (PRILOSEC) 40 MG capsule Take 1 capsule (40 mg total) by mouth daily. (Patient taking differently: Take 40 mg by mouth daily as needed (acid reflux). ) 30 capsule 5  . oxyCODONE (OXY IR/ROXICODONE) 5 MG immediate release tablet Take 1 tablet (5 mg total) by mouth every 6 (six) hours as needed for moderate pain. 20 tablet 0  . XOLAIR 150 MG injection INJECT 300 MG UNDER THE SKIN EVERY 28 DAYS. (Patient taking differently: Inject 300 mg into the skin every 28 (twenty-eight) days. ) 2 vial 11  . furosemide (LASIX) 40 MG tablet TAKE 1 TABLET(40 MG) BY MOUTH TWICE DAILY (Patient taking differently: Take 40 mg by mouth 2 (two) times daily. ) 180 tablet 3   Facility-Administered Medications Prior to Visit  Medication Dose Route Frequency Provider Last Rate Last Dose  . omalizumab [name]) injection 300 mg  300 mg Subcutaneous Q28 days Jiles Prows, MD   300 mg at 02/24/19 3875    Allergies  Allergen Reactions  . Etodolac Shortness Of Breath and Anaphylaxis    REACTION: lips swelled  . Penicillins Anaphylaxis and Swelling    DID THE REACTION INVOLVE: Swelling of the face/tongue/throat, SOB, or low BP? Yes Sudden or severe rash/hives, skin peeling, or the inside of the mouth or nose? No Did it require medical treatment? No When did it last happen?20 years ago If all above answers  are "NO", may proceed with cephalosporin use.   Marland Kitchen Amoxicillin     REACTION: unspecified  . Ampicillin Swelling  . Augmentin [Amoxicillin-Pot Clavulanate]   . Diclofenac Sodium     REACTION: unspecified  . Iodine     REACTION: unspecified  . Pantoprazole Sodium     REACTION: unspecified  . Adhesive [Tape] Rash    ROS Review of Systems    Objective:    Physical Exam  BP (!) 152/82   Pulse 81   Wt (!) 338 lb (153.3 kg)   SpO2 97%   BMI 58.02 kg/m  Wt Readings from Last 3 Encounters:  02/27/19 (!) 338 lb (153.3 kg)  02/03/19 (!) 362 lb 14 oz (164.6 kg)  08/16/18 (!) 414 lb (187.8 kg)   Wt noted.  She has had a 76 lb wt loss since March (6 months) Lungs clear Cardiac RRR without m or g Abd benign Ext 1+ edema  Health Maintenance Due  Topic Date Due  . FOOT EXAM  12/14/2017  . OPHTHALMOLOGY EXAM  04/23/2018    There are no preventive care reminders to display for this patient.  Lab Results  Component Value Date   TSH 0.854 10/16/2016   Lab Results  Component Value Date   WBC 9.3 02/11/2019   HGB 10.9 (L) 02/11/2019   HCT 33.4 (L) 02/11/2019   MCV 89.3 02/11/2019   PLT 266 02/11/2019   Lab Results  Component Value Date   NA 136 02/11/2019   K 3.3 (L) 02/11/2019   CO2 24 02/11/2019   GLUCOSE 139 (H) 02/11/2019   BUN 8 02/11/2019   CREATININE 0.55 02/11/2019   BILITOT 1.2 02/11/2019   ALKPHOS 82 02/11/2019   AST 20 02/11/2019   ALT 18 02/11/2019   PROT 5.3 (L) 02/11/2019   ALBUMIN 1.8 (L) 02/11/2019   CALCIUM 7.6 (L)  02/11/2019   ANIONGAP 9 02/11/2019   Lab Results  Component Value Date   CHOL 170 08/26/2015   Lab Results  Component Value Date   HDL 51 08/26/2015   Lab Results  Component Value Date   LDLCALC 93 08/26/2015   Lab Results  Component Value Date   TRIG 130 08/26/2015   Lab Results  Component Value Date   CHOLHDL 3.3 08/26/2015   Lab Results  Component Value Date   HGBA1C 6.1 (H) 02/03/2019      Assessment &  Plan:   Problem List Items Addressed This Visit    Diastolic CHF with preserved left ventricular function, NYHA class 2 (HCC)   Relevant Medications   furosemide (LASIX) 40 MG tablet   Hypoalbuminemia due to protein-calorie malnutrition ([place])   Relevant Orders   CBC   CMP14+EGFR    Other Visit Diagnoses    Need for immunization against influenza       Relevant Orders   Flu Vaccine QUAD 36+ mos IM (Completed)      Meds ordered this encounter  Medications  . furosemide (LASIX) 40 MG tablet    Sig: Take 1 tablet (40 mg total) by mouth every other day. TAKE 1 TABLET(40 MG) BY MOUTH TWICE DAILY    Dispense:  180 tablet    Refill:  3    Follow-up: No follow-ups on file.    [name], MD

## 2019-02-28 NOTE — Assessment & Plan Note (Addendum)
With her lightheadedness, I will not change treatment of mild systolic hypertension at this point.

## 2019-03-05 DIAGNOSIS — Z48815 Encounter for surgical aftercare following surgery on the digestive system: Secondary | ICD-10-CM | POA: Diagnosis not present

## 2019-03-06 DIAGNOSIS — Z48815 Encounter for surgical aftercare following surgery on the digestive system: Secondary | ICD-10-CM | POA: Diagnosis not present

## 2019-03-07 ENCOUNTER — Other Ambulatory Visit: Payer: Self-pay | Admitting: Surgery

## 2019-03-07 ENCOUNTER — Telehealth: Payer: Self-pay

## 2019-03-07 DIAGNOSIS — R1084 Generalized abdominal pain: Secondary | ICD-10-CM

## 2019-03-07 DIAGNOSIS — Z9889 Other specified postprocedural states: Secondary | ICD-10-CM | POA: Diagnosis not present

## 2019-03-07 DIAGNOSIS — T8149XA Infection following a procedure, other surgical site, initial encounter: Secondary | ICD-10-CM | POA: Diagnosis not present

## 2019-03-07 NOTE — Telephone Encounter (Signed)
Patient calls nurse line stating it has been brought to her attention  she has an Iodine allergy, according to epic. Patient states she has never had a reaction "to my knowledge." Patient has a CT scheduled and they will not perform "with contrast" unless the allergy is removed. Patient stated she had at CT with contrast in August and no reaction occurred. I did verify this in epic. Per [place], ok to remove Iodine allergy.

## 2019-03-12 DIAGNOSIS — Z48815 Encounter for surgical aftercare following surgery on the digestive system: Secondary | ICD-10-CM | POA: Diagnosis not present

## 2019-03-13 ENCOUNTER — Other Ambulatory Visit: Payer: Self-pay

## 2019-03-13 ENCOUNTER — Ambulatory Visit (HOSPITAL_COMMUNITY)
Admission: RE | Admit: 2019-03-13 | Discharge: 2019-03-13 | Disposition: A | Discharge status: Home / Self Care | Payer: Medicaid Other | Source: Ambulatory Visit | Attending: Surgery | Admitting: Surgery

## 2019-03-13 DIAGNOSIS — R1084 Generalized abdominal pain: Secondary | ICD-10-CM

## 2019-03-13 DIAGNOSIS — R109 Unspecified abdominal pain: Secondary | ICD-10-CM | POA: Diagnosis not present

## 2019-03-13 MED ORDER — IOHEXOL 300 MG/ML  SOLN
100.0000 mL | Freq: Once | INTRAMUSCULAR | Status: AC | PRN
  Administered 2019-03-13: 100 mL via INTRAVENOUS

## 2019-03-14 ENCOUNTER — Other Ambulatory Visit: Payer: Self-pay

## 2019-03-14 ENCOUNTER — Other Ambulatory Visit (HOSPITAL_COMMUNITY)
Admission: RE | Admit: 2019-03-14 | Discharge: 2019-03-14 | Disposition: A | Discharge status: Home / Self Care | Payer: Medicaid Other | Source: Ambulatory Visit | Attending: Surgery | Admitting: Surgery

## 2019-03-14 ENCOUNTER — Ambulatory Visit (HOSPITAL_COMMUNITY): Payer: Medicaid Other

## 2019-03-14 ENCOUNTER — Encounter (HOSPITAL_COMMUNITY): Payer: Self-pay | Admitting: *Deleted

## 2019-03-14 ENCOUNTER — Ambulatory Visit: Payer: Self-pay | Admitting: Surgery

## 2019-03-14 ENCOUNTER — Telehealth: Payer: Self-pay | Admitting: *Deleted

## 2019-03-14 DIAGNOSIS — Z01812 Encounter for preprocedural laboratory examination: Secondary | ICD-10-CM | POA: Insufficient documentation

## 2019-03-14 DIAGNOSIS — Z20828 Contact with and (suspected) exposure to other viral communicable diseases: Secondary | ICD-10-CM | POA: Diagnosis not present

## 2019-03-14 NOTE — H&P (Signed)
History of Present Illness Kelsey Bullock. Kelsey Andalon MD; 03/14/2019 1:01 PM) The patient is a 58 year old female presenting for a post-operative visit. The patient is a 58 year old female who presents for wound check. This is a 58 year old female with a BMI of 55 who presents with an incarcerated infraumbilical ventral hernia with small bowel obstruction. Apparently the patient had some type of laparoscopic procedure performed by GYN about 20 years ago. This was converted to an open procedure. She did not realize that she had a hernia until she came to the ER yesterday. She has had a couple of days of distention, nausea, vomiting, and no bowel movements. CT scan showed a large incarcerated umbilical hernia with small bowel obstruction. The fascial defect appears relatively small.   She was admitted to the hospital on 02/02/19. The next morning, she underwent exploratory laparotomy, small bowel resection, and primary repair of the ventral hernia. We resected a large amount of omentum from the hernia sac. We also resected approximately 20 cm of ischemic small bowel. The hernia defect measured about 4 cm across and was closed with #1Novafil sutures. A drain was placed in the large dead space in the subcutaneous tissue. She progressed slowly and was finally discharged on 02/11/19. The drain was removed prior to discharge.  She was seen on 02/18/19. We opened her wound and evacuated large subcutaneous wound infection. She has been performing twice a day dressing changes. She still has some drainage. She remains afebrile. She has been having daily bowel movements.  She was reevaluated on 02/24/19. The wound was clean her but remains quite deep, it extends about 10 cm deep and superiorly to the fascia. I cannot visualize the fascia but probing with a cotton swab shows that the fascia seems to be intact. She continues to have persistent intermittent pain in this area. Recent CBC showed a normal white blood  cell count and normal hemoglobin. Electrolytes were also within normal limits. A CT scan was obtained.  CLINICAL DATA: Abdominal pain. Incarcerated ventral hernia with small bowel obstruction. Status post  Exploratory laparotomy, small bowel resection, primary repair of ventral hernia on 02/03/2019.  EXAM: CT ABDOMEN AND PELVIS WITH CONTRAST  TECHNIQUE: Multidetector CT imaging of the abdomen and pelvis was performed using the standard protocol following bolus administration of intravenous contrast.  CONTRAST: 144mL OMNIPAQUE IOHEXOL 300 MG/ML SOLN  COMPARISON: 02/02/2019  FINDINGS: Lower chest: Unremarkable.  Hepatobiliary: No suspicious focal abnormality within the liver parenchyma. Liver measures 20.3 cm craniocaudal length, enlarged. Tiny layering gallstones evident (axial image 35/series 3). No intrahepatic or extrahepatic biliary dilation.  Pancreas: No focal mass lesion. No dilatation of the main duct. No intraparenchymal cyst. No peripancreatic edema.  Spleen: No splenomegaly. No focal mass lesion.  Adrenals/Urinary Tract: No adrenal nodule or mass. 12 mm low-density lesion upper pole right kidney is stable and likely a cyst. Left kidney unremarkable. No evidence for hydroureter. The urinary bladder appears normal for the degree of distention.  Stomach/Bowel: Stomach is unremarkable. No gastric wall thickening. No evidence of outlet obstruction. Duodenum is normally positioned as is the ligament of Treitz. No small bowel wall thickening. No small bowel dilatation. The terminal ileum is normal. The appendix is normal. No gross colonic mass. No colonic wall thickening.  Vascular/Lymphatic: There is abdominal aortic atherosclerosis without aneurysm. There is no gastrohepatic or hepatoduodenal ligament lymphadenopathy. No intraperitoneal or retroperitoneal lymphadenopathy. No pelvic sidewall lymphadenopathy.  Reproductive: The uterus is unremarkable. There  is no adnexal mass.  Other:  No intraperitoneal free fluid.  Musculoskeletal: 6.1 x 3.9 x 4.4 cm collection of gas and debris is identified in the deep subcutaneous fat along the midline and right paramidline rectus fascia, just caudal to the umbilicus. This is compatible with the patient's known wound and is at the site of the ventral hernia seen on the prior study. The rectus sheath is ill-defined in this region compatible with the recent surgery and open wound. There is extension of focal fat density deep to the rectus sheath that has a encasing rim of ill-defined soft tissue attenuation measuring 4.7 x 3.9 x 3.8 cm. There are some gas bubbles in this apparently loculated fat deep to the hernia repair site. Given that the patient is more than a month out from surgery, residual gas from the operation would not be expected. This process may be in the preperitoneal fat superficial to the peritoneum, but extension through the peritoneal lining in the omentum cannot be completely excluded. There is no associated fluid collection to suggest drainable abscess.  IMPRESSION: 1. 6 x 4 x 4 cm collection of gas and debris identified in the deep subcutaneous fat of the lower anterior abdominal wall at the site of hernia repair. This is compatible with the patient's known open wound. Just deep to this wound and deep to the rectus compartment, there is a focus of loculated or contained fat density with a rim of ill-defined soft tissue attenuation and several gas locules. This process tracks cranially behind the rectus sheath and is probably in the preperitoneal space, but extension through the peritoneum into the anterior peritoneal cavity/omentum cannot be excluded by CT. There is no associated fluid collection to suggest drainable abscess. Gas would be unexpected given the patient is more than 1 month out from surgery and communication to the superficial wound would be a primary consideration.  Superinfection of this region could also generate gas. 2. Hepatomegaly 3. Cholelithiasis.   Electronically Signed By: Kelsey Bullock M.D. On: 03/14/2019 07:51     Problem List/Past Medical Kelsey Key K. 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 Lakhia Gengler, MD; 03/14/2019 1:02 PM) Colonoscopy within last year Mammogram within last year  Allergies (Kelsey Bullock, CMA; 03/14/2019 9:05 AM) Penicillins Lodine *ANALGESICS - ANTI-INFLAMMATORY* Adhesive Tape *MEDICAL DEVICES AND SUPPLIES*  Medication History (Kelsey Bullock, CMA; 03/14/2019 9:05 AM) Famotidine (20MG  Tablet, Oral) Active. Albuterol Sulfate HFA (108 (90 Base)MCG/ACT Aerosol Soln, Inhalation) Active. Lisinopril (40MG  Tablet, Oral) Active. Fluticasone Propionate (50MCG/ACT Suspension, Nasal) Active. Montelukast Sodium (10MG  Tablet, Oral) Active. Atorvastatin Calcium (40MG  Tablet, Oral) Active. Furosemide (40MG  Tablet, Oral) Active. Medications Reconciled  Social History Kelsey Bullock. Kelsey Cavallaro, MD; 03/14/2019 1:02 PM) No alcohol use Tobacco use Never smoker.  Family History Kelsey Bullock. Kelsey Larusso, MD; 03/14/2019 1:02 PM) Heart Disease Father, Mother. Hypertension Mother, Sister. Thyroid problems Sister.  Pregnancy / Birth History Kelsey Bullock. Kelsey Rettig, MD; 03/14/2019 1:02 PM) Age at menarche 60 years. Gravida 3 Irregular periods Length (months) of breastfeeding 12-24 Maternal age 32-35  Other Problems Kelsey Bullock. Kelsey Dillehay, MD; 03/14/2019 1:02 PM) Gastroesophageal Reflux Disease Hypercholesterolemia    Vitals (Kelsey Bullock CMA; 03/14/2019 9:06 AM) 03/14/2019 9:05 AM Weight: 338.5 lb Height: 64in Body Surface Area: 2.45 m Body Mass Index: 58.1 kg/m  Temp.: 97.32F(Oral)  Pulse: 83  (Regular)  BP: 118/64 (Supine, Right Arm, Standard)  Physical Exam Kelsey Key K. Orlando Devereux MD; 03/14/2019 1:03 PM)  The physical exam findings are as follows: Note:Obese female - mildly uncomfortable Eyes: Pupils equal, round; sclera anicteric HENT: Oral mucosa moist; good dentition Neck: No masses palpated, no thyromegaly Lungs: CTA bilaterally; normal respiratory effort CV: Regular rate and rhythm; no murmurs; extremities well-perfused with no edema Abd: +bowel sounds, soft, non-tender, open midline wound with clean granulation tissue. The central portion of the wound tracks superiorly and deep for a distance of at least 10 cm. When I probe with Kelsey Bullock back in palpate what appears to be the fascia. However I cannot visualize this area. Skin: Warm, dry; no sign of jaundice Psychiatric - alert and oriented x 4; calm mood and affect    Assessment & Plan Kelsey Key K. 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 The surgical procedure has been discussed with the patient. Potential risks, benefits, alternative treatments, and expected outcomes have been explained. All of the patient's questions at this time have been answered. The likelihood of reaching the patient's treatment goal is good. The patient understand the proposed surgical procedure and wishes to proceed. Note:The patient seems to have an area of infection deep within the wound at the level of the fascia. There is a possibility that this tracks behind the rectus sheath and possibly in the preperitoneal space. Because of these findings and her continued symptoms, I recommend a wound exploration with possible exploratory laparotomy to debridement the fascia and to reclose primarily. She may  require a VAC dressing. We will attempt to perform this early next week.  Kelsey Bullock. Georgette Dover, MD, [facility] [phone]  03/14/2019 1:07 PM

## 2019-03-14 NOTE — Progress Notes (Signed)
Spoke with pt for pre-op call. Pt has hx of CHF, denies any CAD.   Pt had Covid test done today, states she is in quarantine and will continue to be until surgery.  Pt informed of visitation policy and voiced understanding.

## 2019-03-14 NOTE — H&P (View-Only) (Signed)
History of Present Illness Kelsey Bullock. Kelsey Christian MD; 03/14/2019 1:01 PM) The patient is a 58 year old female presenting for a post-operative visit. The patient is a 58 year old female who presents for wound check. This is a 58 year old female with a BMI of 52 who presents with an incarcerated infraumbilical ventral hernia with small bowel obstruction. Apparently the patient had some type of laparoscopic procedure performed by GYN about 20 years ago. This was converted to an open procedure. She did not realize that she had a hernia until she came to the ER yesterday. She has had a couple of days of distention, nausea, vomiting, and no bowel movements. CT scan showed a large incarcerated umbilical hernia with small bowel obstruction. The fascial defect appears relatively small.   She was admitted to the hospital on 02/02/19. The next morning, she underwent exploratory laparotomy, small bowel resection, and primary repair of the ventral hernia. We resected a large amount of omentum from the hernia sac. We also resected approximately 20 cm of ischemic small bowel. The hernia defect measured about 4 cm across and was closed with #1Novafil sutures. A drain was placed in the large dead space in the subcutaneous tissue. She progressed slowly and was finally discharged on 02/11/19. The drain was removed prior to discharge.  She was seen on 02/18/19. We opened her wound and evacuated large subcutaneous wound infection. She has been performing twice a day dressing changes. She still has some drainage. She remains afebrile. She has been having daily bowel movements.  She was reevaluated on 02/24/19. The wound was clean her but remains quite deep, it extends about 10 cm deep and superiorly to the fascia. I cannot visualize the fascia but probing with a cotton swab shows that the fascia seems to be intact. She continues to have persistent intermittent pain in this area. Recent CBC showed a normal white blood  cell count and normal hemoglobin. Electrolytes were also within normal limits. A CT scan was obtained.  CLINICAL DATA: Abdominal pain. Incarcerated ventral hernia with small bowel obstruction. Status post  Exploratory laparotomy, small bowel resection, primary repair of ventral hernia on 02/03/2019.  EXAM: CT ABDOMEN AND PELVIS WITH CONTRAST  TECHNIQUE: Multidetector CT imaging of the abdomen and pelvis was performed using the standard protocol following bolus administration of intravenous contrast.  CONTRAST: 148mL OMNIPAQUE IOHEXOL 300 MG/ML SOLN  COMPARISON: 02/02/2019  FINDINGS: Lower chest: Unremarkable.  Hepatobiliary: No suspicious focal abnormality within the liver parenchyma. Liver measures 20.3 cm craniocaudal length, enlarged. Tiny layering gallstones evident (axial image 35/series 3). No intrahepatic or extrahepatic biliary dilation.  Pancreas: No focal mass lesion. No dilatation of the main duct. No intraparenchymal cyst. No peripancreatic edema.  Spleen: No splenomegaly. No focal mass lesion.  Adrenals/Urinary Tract: No adrenal nodule or mass. 12 mm low-density lesion upper pole right kidney is stable and likely a cyst. Left kidney unremarkable. No evidence for hydroureter. The urinary bladder appears normal for the degree of distention.  Stomach/Bowel: Stomach is unremarkable. No gastric wall thickening. No evidence of outlet obstruction. Duodenum is normally positioned as is the ligament of Treitz. No small bowel wall thickening. No small bowel dilatation. The terminal ileum is normal. The appendix is normal. No gross colonic mass. No colonic wall thickening.  Vascular/Lymphatic: There is abdominal aortic atherosclerosis without aneurysm. There is no gastrohepatic or hepatoduodenal ligament lymphadenopathy. No intraperitoneal or retroperitoneal lymphadenopathy. No pelvic sidewall lymphadenopathy.  Reproductive: The uterus is unremarkable. There  is no adnexal mass.  Other:  No intraperitoneal free fluid.  Musculoskeletal: 6.1 x 3.9 x 4.4 cm collection of gas and debris is identified in the deep subcutaneous fat along the midline and right paramidline rectus fascia, just caudal to the umbilicus. This is compatible with the patient's known wound and is at the site of the ventral hernia seen on the prior study. The rectus sheath is ill-defined in this region compatible with the recent surgery and open wound. There is extension of focal fat density deep to the rectus sheath that has a encasing rim of ill-defined soft tissue attenuation measuring 4.7 x 3.9 x 3.8 cm. There are some gas bubbles in this apparently loculated fat deep to the hernia repair site. Given that the patient is more than a month out from surgery, residual gas from the operation would not be expected. This process may be in the preperitoneal fat superficial to the peritoneum, but extension through the peritoneal lining in the omentum cannot be completely excluded. There is no associated fluid collection to suggest drainable abscess.  IMPRESSION: 1. 6 x 4 x 4 cm collection of gas and debris identified in the deep subcutaneous fat of the lower anterior abdominal wall at the site of hernia repair. This is compatible with the patient's known open wound. Just deep to this wound and deep to the rectus compartment, there is a focus of loculated or contained fat density with a rim of ill-defined soft tissue attenuation and several gas locules. This process tracks cranially behind the rectus sheath and is probably in the preperitoneal space, but extension through the peritoneum into the anterior peritoneal cavity/omentum cannot be excluded by CT. There is no associated fluid collection to suggest drainable abscess. Gas would be unexpected given the patient is more than 1 month out from surgery and communication to the superficial wound would be a primary consideration.  Superinfection of this region could also generate gas. 2. Hepatomegaly 3. Cholelithiasis.   Electronically Signed By: Kelsey Bullock M.D. On: 03/14/2019 07:51     Problem List/Past Medical Kelsey Key K. 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 Nyara Capell, MD; 03/14/2019 1:02 PM) Colonoscopy within last year Mammogram within last year  Allergies (April Staton, CMA; 03/14/2019 9:05 AM) Penicillins Lodine *ANALGESICS - ANTI-INFLAMMATORY* Adhesive Tape *MEDICAL DEVICES AND SUPPLIES*  Medication History (April Staton, CMA; 03/14/2019 9:05 AM) Famotidine (20MG  Tablet, Oral) Active. Albuterol Sulfate HFA (108 (90 Base)MCG/ACT Aerosol Soln, Inhalation) Active. Lisinopril (40MG  Tablet, Oral) Active. Fluticasone Propionate (50MCG/ACT Suspension, Nasal) Active. Montelukast Sodium (10MG  Tablet, Oral) Active. Atorvastatin Calcium (40MG  Tablet, Oral) Active. Furosemide (40MG  Tablet, Oral) Active. Medications Reconciled  Social History Kelsey Bullock. Rozelle Caudle, MD; 03/14/2019 1:02 PM) No alcohol use Tobacco use Never smoker.  Family History Kelsey Bullock. Brennyn Haisley, MD; 03/14/2019 1:02 PM) Heart Disease Father, Mother. Hypertension Mother, Sister. Thyroid problems Sister.  Pregnancy / Birth History Kelsey Bullock. Ordean Fouts, MD; 03/14/2019 1:02 PM) Age at menarche 27 years. Gravida 3 Irregular periods Length (months) of breastfeeding 12-24 Maternal age 64-35  Other Problems Kelsey Bullock. Yidel Teuscher, MD; 03/14/2019 1:02 PM) Gastroesophageal Reflux Disease Hypercholesterolemia    Vitals (April Staton CMA; 03/14/2019 9:06 AM) 03/14/2019 9:05 AM Weight: 338.5 lb Height: 64in Body Surface Area: 2.45 m Body Mass Index: 58.1 kg/m  Temp.: 97.58F(Oral)  Pulse: 83  (Regular)  BP: 118/64 (Supine, Right Arm, Standard)  Physical Exam Kelsey Key K. Caiden Monsivais MD; 03/14/2019 1:03 PM)  The physical exam findings are as follows: Note:Obese female - mildly uncomfortable Eyes: Pupils equal, round; sclera anicteric HENT: Oral mucosa moist; good dentition Neck: No masses palpated, no thyromegaly Lungs: CTA bilaterally; normal respiratory effort CV: Regular rate and rhythm; no murmurs; extremities well-perfused with no edema Abd: +bowel sounds, soft, non-tender, open midline wound with clean granulation tissue. The central portion of the wound tracks superiorly and deep for a distance of at least 10 cm. When I probe with [place] back in palpate what appears to be the fascia. However I cannot visualize this area. Skin: Warm, dry; no sign of jaundice Psychiatric - alert and oriented x 4; calm mood and affect    Assessment & Plan Kelsey Key K. 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 The surgical procedure has been discussed with the patient. Potential risks, benefits, alternative treatments, and expected outcomes have been explained. All of the patient's questions at this time have been answered. The likelihood of reaching the patient's treatment goal is good. The patient understand the proposed surgical procedure and wishes to proceed. Note:The patient seems to have an area of infection deep within the wound at the level of the fascia. There is a possibility that this tracks behind the rectus sheath and possibly in the preperitoneal space. Because of these findings and her continued symptoms, I recommend a wound exploration with possible exploratory laparotomy to debridement the fascia and to reclose primarily. She may  require a VAC dressing. We will attempt to perform this early next week.  Kelsey Bullock. Georgette Dover, MD, [facility] [phone]  03/14/2019 1:07 PM

## 2019-03-14 NOTE — Telephone Encounter (Signed)
Pt wanted to let PCP know that she needs to go back for surgery on Tuesday.  She is "not healing well and has an infection in her muscle:. Christen Bame, CMA

## 2019-03-17 ENCOUNTER — Telehealth: Payer: Self-pay | Admitting: *Deleted

## 2019-03-17 ENCOUNTER — Telehealth: Payer: Self-pay

## 2019-03-17 LAB — NOVEL CORONAVIRUS, NAA (HOSP ORDER, SEND-OUT TO REF LAB; TAT 18-24 HRS): SARS-CoV-2, NAA: NOT DETECTED

## 2019-03-17 MED ORDER — VANCOMYCIN HCL 10 G IV SOLR
1500.0000 mg | INTRAVENOUS | Status: AC
  Administered 2019-03-18: 1500 mg via INTRAVENOUS
  Filled 2019-03-17: qty 1500

## 2019-03-17 NOTE — Telephone Encounter (Signed)
I am aware.  I was copied on a note from the surgeon.

## 2019-03-17 NOTE — Progress Notes (Signed)
Pt called and left message for me to call her back. She wanted to make sure that we knew she was a hard IV stick and usually needed the IV team and ultrasound. I told her that if our nurses couldn't get the IV, the nurse anesthetist would more than likely be able to get it. She also wanted to know if " I passed the Covid test", I told her that it was negative.

## 2019-03-17 NOTE — Telephone Encounter (Signed)
-----   Message from Jiles Prows, MD sent at 03/17/2019  4:30 PM EDT ----- Lynelle Smoke, can you address this issue? Please let her know.  Patient is on 300 mg of Xolair and stated she is having surgery on her infection she has been dealing with for several weeks. Patient is not sure when she will be healed since she will be in the hospital for awhile and will be having a wound vac for her infection. Patient was wanting to know if the hospital could give her her Xolair injections. I advise to patient that we are to give injections here in the office and don't have authority from the insurance company to do so. She is schedule for next week 03/24/2019 to get her Xolair injections but will call on Friday 03/21/2019 to see if she will be able to make it. Patient is due for an OV with Dr. Neldon Mc for reaproval of [place].

## 2019-03-17 NOTE — Telephone Encounter (Signed)
Patient is on 300 mg of Xolair and stated she is having surgery on her infection she has been dealing with for several weeks. Patient is not sure when she will be healed since she will be in the hospital for awhile and will be having a wound vac for her infection. Patient was wanting to know if the hospital could give her her Xolair injections. I advise to patient that we are to give injections here in the office and don't have authority from the insurance company to do so. She is schedule for next week 03/24/2019 to get her Xolair injections but will call on Friday 03/21/2019 to see if she will be able to make it. Patient is due for an OV with Dr. Neldon Mc for reaproval of [place].

## 2019-03-17 NOTE — Telephone Encounter (Signed)
Advised patient that she can in as soon as she feels she can after procedure

## 2019-03-17 NOTE — Telephone Encounter (Signed)
L/M for patient to contact me.  Her medication should be coming in on the 15th and she could come in early. Unfortunately, her medication comes to our office from specialty pharmacy and could not be administered in hospital since it is a physician office adminisitered medication

## 2019-03-18 ENCOUNTER — Encounter (HOSPITAL_COMMUNITY): Payer: Self-pay | Admitting: *Deleted

## 2019-03-18 ENCOUNTER — Inpatient Hospital Stay (HOSPITAL_COMMUNITY): Payer: Medicaid Other | Admitting: Certified Registered"

## 2019-03-18 ENCOUNTER — Encounter (HOSPITAL_COMMUNITY)
Admission: RE | Disposition: A | Discharge status: Home Health | Payer: Self-pay | Source: Home / Self Care | Attending: Family Medicine

## 2019-03-18 ENCOUNTER — Other Ambulatory Visit: Payer: Self-pay

## 2019-03-18 ENCOUNTER — Inpatient Hospital Stay (HOSPITAL_COMMUNITY)
Admission: RE | Admit: 2019-03-18 | Discharge: 2019-04-04 | DRG: 856 | Disposition: A | Discharge status: Home Health | Payer: Medicaid Other | Attending: Internal Medicine | Admitting: Internal Medicine

## 2019-03-18 DIAGNOSIS — N938 Other specified abnormal uterine and vaginal bleeding: Secondary | ICD-10-CM | POA: Diagnosis present

## 2019-03-18 DIAGNOSIS — J1289 Other viral pneumonia: Secondary | ICD-10-CM | POA: Diagnosis not present

## 2019-03-18 DIAGNOSIS — Z6841 Body Mass Index (BMI) 40.0 and over, adult: Secondary | ICD-10-CM | POA: Diagnosis not present

## 2019-03-18 DIAGNOSIS — Z8249 Family history of ischemic heart disease and other diseases of the circulatory system: Secondary | ICD-10-CM | POA: Diagnosis not present

## 2019-03-18 DIAGNOSIS — M7989 Other specified soft tissue disorders: Secondary | ICD-10-CM | POA: Diagnosis not present

## 2019-03-18 DIAGNOSIS — E7800 Pure hypercholesterolemia, unspecified: Secondary | ICD-10-CM | POA: Diagnosis not present

## 2019-03-18 DIAGNOSIS — J45909 Unspecified asthma, uncomplicated: Secondary | ICD-10-CM | POA: Diagnosis not present

## 2019-03-18 DIAGNOSIS — Z881 Allergy status to other antibiotic agents status: Secondary | ICD-10-CM | POA: Diagnosis not present

## 2019-03-18 DIAGNOSIS — Z888 Allergy status to other drugs, medicaments and biological substances status: Secondary | ICD-10-CM | POA: Diagnosis not present

## 2019-03-18 DIAGNOSIS — T8130XA Disruption of wound, unspecified, initial encounter: Secondary | ICD-10-CM | POA: Diagnosis not present

## 2019-03-18 DIAGNOSIS — R509 Fever, unspecified: Secondary | ICD-10-CM

## 2019-03-18 DIAGNOSIS — I5032 Chronic diastolic (congestive) heart failure: Secondary | ICD-10-CM | POA: Diagnosis present

## 2019-03-18 DIAGNOSIS — Z880 Allergy status to penicillin: Secondary | ICD-10-CM

## 2019-03-18 DIAGNOSIS — K649 Unspecified hemorrhoids: Secondary | ICD-10-CM | POA: Diagnosis present

## 2019-03-18 DIAGNOSIS — R7303 Prediabetes: Secondary | ICD-10-CM | POA: Diagnosis present

## 2019-03-18 DIAGNOSIS — E669 Obesity, unspecified: Secondary | ICD-10-CM | POA: Diagnosis present

## 2019-03-18 DIAGNOSIS — I517 Cardiomegaly: Secondary | ICD-10-CM | POA: Diagnosis not present

## 2019-03-18 DIAGNOSIS — B999 Unspecified infectious disease: Secondary | ICD-10-CM | POA: Diagnosis present

## 2019-03-18 DIAGNOSIS — T8141XA Infection following a procedure, superficial incisional surgical site, initial encounter: Secondary | ICD-10-CM | POA: Diagnosis present

## 2019-03-18 DIAGNOSIS — I10 Essential (primary) hypertension: Secondary | ICD-10-CM | POA: Diagnosis present

## 2019-03-18 DIAGNOSIS — L089 Local infection of the skin and subcutaneous tissue, unspecified: Secondary | ICD-10-CM | POA: Diagnosis not present

## 2019-03-18 DIAGNOSIS — Z91048 Other nonmedicinal substance allergy status: Secondary | ICD-10-CM

## 2019-03-18 DIAGNOSIS — T8143XA Infection following a procedure, organ and space surgical site, initial encounter: Secondary | ICD-10-CM | POA: Diagnosis not present

## 2019-03-18 DIAGNOSIS — E1140 Type 2 diabetes mellitus with diabetic neuropathy, unspecified: Secondary | ICD-10-CM | POA: Diagnosis not present

## 2019-03-18 DIAGNOSIS — U071 COVID-19: Secondary | ICD-10-CM | POA: Diagnosis not present

## 2019-03-18 DIAGNOSIS — R918 Other nonspecific abnormal finding of lung field: Secondary | ICD-10-CM | POA: Diagnosis not present

## 2019-03-18 DIAGNOSIS — R110 Nausea: Secondary | ICD-10-CM | POA: Diagnosis not present

## 2019-03-18 DIAGNOSIS — R0902 Hypoxemia: Secondary | ICD-10-CM

## 2019-03-18 DIAGNOSIS — E785 Hyperlipidemia, unspecified: Secondary | ICD-10-CM | POA: Diagnosis not present

## 2019-03-18 DIAGNOSIS — F411 Generalized anxiety disorder: Secondary | ICD-10-CM | POA: Diagnosis present

## 2019-03-18 DIAGNOSIS — J9601 Acute respiratory failure with hypoxia: Secondary | ICD-10-CM | POA: Diagnosis not present

## 2019-03-18 DIAGNOSIS — I5030 Unspecified diastolic (congestive) heart failure: Secondary | ICD-10-CM | POA: Diagnosis not present

## 2019-03-18 DIAGNOSIS — K219 Gastro-esophageal reflux disease without esophagitis: Secondary | ICD-10-CM | POA: Diagnosis not present

## 2019-03-18 DIAGNOSIS — Y838 Other surgical procedures as the cause of abnormal reaction of the patient, or of later complication, without mention of misadventure at the time of the procedure: Secondary | ICD-10-CM | POA: Diagnosis present

## 2019-03-18 DIAGNOSIS — K5900 Constipation, unspecified: Secondary | ICD-10-CM | POA: Diagnosis not present

## 2019-03-18 DIAGNOSIS — I110 Hypertensive heart disease with heart failure: Secondary | ICD-10-CM | POA: Diagnosis present

## 2019-03-18 DIAGNOSIS — G4733 Obstructive sleep apnea (adult) (pediatric): Secondary | ICD-10-CM | POA: Diagnosis present

## 2019-03-18 DIAGNOSIS — M1991 Primary osteoarthritis, unspecified site: Secondary | ICD-10-CM | POA: Diagnosis present

## 2019-03-18 DIAGNOSIS — M79609 Pain in unspecified limb: Secondary | ICD-10-CM | POA: Diagnosis not present

## 2019-03-18 DIAGNOSIS — T8140XA Infection following a procedure, unspecified, initial encounter: Secondary | ICD-10-CM | POA: Diagnosis not present

## 2019-03-18 DIAGNOSIS — S31109D Unspecified open wound of abdominal wall, unspecified quadrant without penetration into peritoneal cavity, subsequent encounter: Secondary | ICD-10-CM | POA: Diagnosis not present

## 2019-03-18 HISTORY — DX: Essential (primary) hypertension: I10

## 2019-03-18 HISTORY — DX: Thyrotoxicosis, unspecified without thyrotoxic crisis or storm: E05.90

## 2019-03-18 HISTORY — PX: APPLICATION OF WOUND VAC: SHX5189

## 2019-03-18 HISTORY — DX: Anxiety disorder, unspecified: F41.9

## 2019-03-18 HISTORY — DX: Anemia, unspecified: D64.9

## 2019-03-18 HISTORY — PX: WOUND EXPLORATION: SHX2669

## 2019-03-18 HISTORY — PX: WOUND EXPLORATION: SHX6188

## 2019-03-18 HISTORY — DX: Unspecified osteoarthritis, unspecified site: M19.90

## 2019-03-18 HISTORY — DX: Pneumonia, unspecified organism: J18.9

## 2019-03-18 HISTORY — DX: Sleep apnea, unspecified: G47.30

## 2019-03-18 HISTORY — DX: Headache, unspecified: R51.9

## 2019-03-18 HISTORY — DX: Personal history of urinary calculi: Z87.442

## 2019-03-18 LAB — CBC
HCT: 35.8 % — ABNORMAL LOW (ref 36.0–46.0)
Hemoglobin: 11.5 g/dL — ABNORMAL LOW (ref 12.0–15.0)
MCH: 28.7 pg (ref 26.0–34.0)
MCHC: 32.1 g/dL (ref 30.0–36.0)
MCV: 89.3 fL (ref 80.0–100.0)
Platelets: 221 10*3/uL (ref 150–400)
RBC: 4.01 MIL/uL (ref 3.87–5.11)
RDW: 15 % (ref 11.5–15.5)
WBC: 4.1 10*3/uL (ref 4.0–10.5)
nRBC: 0 % (ref 0.0–0.2)

## 2019-03-18 LAB — BASIC METABOLIC PANEL
Anion gap: 11 (ref 5–15)
BUN: 8 mg/dL (ref 6–20)
CO2: 23 mmol/L (ref 22–32)
Calcium: 8.7 mg/dL — ABNORMAL LOW (ref 8.9–10.3)
Chloride: 104 mmol/L (ref 98–111)
Creatinine, Ser: 0.73 mg/dL (ref 0.44–1.00)
GFR calc Af Amer: >60 mL/min (ref >60)
GFR calc non Af Amer: >60 mL/min (ref >60)
Glucose, Bld: 100 mg/dL — ABNORMAL HIGH (ref 70–99)
Potassium: 3.5 mmol/L (ref 3.5–5.1)
Sodium: 138 mmol/L (ref 135–145)

## 2019-03-18 LAB — POCT PREGNANCY, URINE: Preg Test, Ur: NEGATIVE

## 2019-03-18 SURGERY — WOUND EXPLORATION
Anesthesia: General | Site: Abdomen

## 2019-03-18 MED ORDER — 0.9 % SODIUM CHLORIDE (POUR BTL) OPTIME
TOPICAL | Status: DC | PRN
  Administered 2019-03-18 (×2): 1000 mL

## 2019-03-18 MED ORDER — FENTANYL CITRATE (PF) 250 MCG/5ML IJ SOLN
INTRAMUSCULAR | Status: DC | PRN
  Administered 2019-03-18: 100 ug via INTRAVENOUS

## 2019-03-18 MED ORDER — OXYMETAZOLINE HCL 0.05 % NA SOLN
1.0000 | Freq: Two times a day (BID) | NASAL | Status: DC | PRN
  Administered 2019-04-01 – 2019-04-02 (×2): 1 via NASAL
  Filled 2019-03-18: qty 30
  Filled 2019-03-18: qty 15

## 2019-03-18 MED ORDER — LACTATED RINGERS IV SOLN
INTRAVENOUS | Status: DC
  Administered 2019-03-18: 09:00:00 via INTRAVENOUS

## 2019-03-18 MED ORDER — ROCURONIUM BROMIDE 10 MG/ML (PF) SYRINGE
PREFILLED_SYRINGE | INTRAVENOUS | Status: AC
  Filled 2019-03-18: qty 10

## 2019-03-18 MED ORDER — DIPHENHYDRAMINE HCL 50 MG/ML IJ SOLN: 25.0000 mg | Freq: Four times a day (QID) | INTRAMUSCULAR | Status: DC | PRN

## 2019-03-18 MED ORDER — ACETAMINOPHEN 10 MG/ML IV SOLN
INTRAVENOUS | Status: AC
  Filled 2019-03-18: qty 100

## 2019-03-18 MED ORDER — FLUCONAZOLE 100 MG PO TABS: 100.0000 mg | ORAL_TABLET | Freq: Every day | ORAL | Status: DC

## 2019-03-18 MED ORDER — DEXAMETHASONE SODIUM PHOSPHATE 10 MG/ML IJ SOLN
INTRAMUSCULAR | Status: AC
  Filled 2019-03-18: qty 1

## 2019-03-18 MED ORDER — ATORVASTATIN CALCIUM 40 MG PO TABS
40.0000 mg | ORAL_TABLET | Freq: Every day | ORAL | Status: DC
  Administered 2019-03-18 – 2019-04-03 (×16): 40 mg via ORAL
  Filled 2019-03-18 (×17): qty 1

## 2019-03-18 MED ORDER — NORETHINDRONE ACETATE 5 MG PO TABS
2.5000 mg | ORAL_TABLET | Freq: Every day | ORAL | Status: DC
  Administered 2019-03-18 – 2019-04-03 (×16): 2.5 mg via ORAL
  Filled 2019-03-18 (×19): qty 1

## 2019-03-18 MED ORDER — MOMETASONE FURO-FORMOTEROL FUM 200-5 MCG/ACT IN AERO
2.0000 | INHALATION_SPRAY | Freq: Two times a day (BID) | RESPIRATORY_TRACT | Status: DC
  Administered 2019-03-18 – 2019-04-04 (×33): 2 via RESPIRATORY_TRACT
  Filled 2019-03-18: qty 8.8

## 2019-03-18 MED ORDER — PHENYLEPHRINE 40 MCG/ML (10ML) SYRINGE FOR IV PUSH (FOR BLOOD PRESSURE SUPPORT)
PREFILLED_SYRINGE | INTRAVENOUS | Status: DC | PRN
  Administered 2019-03-18: 120 ug via INTRAVENOUS
  Administered 2019-03-18: 80 ug via INTRAVENOUS
  Administered 2019-03-18: 120 ug via INTRAVENOUS

## 2019-03-18 MED ORDER — CLONAZEPAM 1 MG PO TABS
1.0000 mg | ORAL_TABLET | Freq: Two times a day (BID) | ORAL | Status: DC | PRN
  Administered 2019-03-20 – 2019-04-03 (×6): 1 mg via ORAL
  Filled 2019-03-18 (×6): qty 1

## 2019-03-18 MED ORDER — PROMETHAZINE HCL 25 MG/ML IJ SOLN: 6.2500 mg | INTRAMUSCULAR | Status: DC | PRN

## 2019-03-18 MED ORDER — OLOPATADINE HCL 0.1 % OP SOLN
1.0000 [drp] | Freq: Every day | OPHTHALMIC | Status: DC
  Administered 2019-03-18 – 2019-04-03 (×14): 1 [drp] via OPHTHALMIC
  Filled 2019-03-18 (×2): qty 5

## 2019-03-18 MED ORDER — ACETAMINOPHEN 650 MG RE SUPP: 650.0000 mg | Freq: Four times a day (QID) | RECTAL | Status: DC | PRN

## 2019-03-18 MED ORDER — ENOXAPARIN SODIUM 40 MG/0.4ML ~~LOC~~ SOLN
40.0000 mg | SUBCUTANEOUS | Status: DC
  Administered 2019-03-19 – 2019-03-22 (×4): 40 mg via SUBCUTANEOUS
  Filled 2019-03-18 (×4): qty 0.4

## 2019-03-18 MED ORDER — FLUTICASONE PROPIONATE 50 MCG/ACT NA SUSP
2.0000 | Freq: Every day | NASAL | Status: DC | PRN
  Filled 2019-03-18: qty 16

## 2019-03-18 MED ORDER — DOCUSATE SODIUM 100 MG PO CAPS
100.0000 mg | ORAL_CAPSULE | Freq: Two times a day (BID) | ORAL | Status: DC
  Administered 2019-03-18 – 2019-03-26 (×15): 100 mg via ORAL
  Filled 2019-03-18 (×15): qty 1

## 2019-03-18 MED ORDER — LISINOPRIL 20 MG PO TABS
40.0000 mg | ORAL_TABLET | Freq: Every day | ORAL | Status: DC
  Administered 2019-03-19 – 2019-04-04 (×16): 40 mg via ORAL
  Filled 2019-03-18 (×2): qty 2
  Filled 2019-03-18: qty 1
  Filled 2019-03-18 (×7): qty 2
  Filled 2019-03-18: qty 1
  Filled 2019-03-18 (×2): qty 2
  Filled 2019-03-18: qty 4
  Filled 2019-03-18: qty 1
  Filled 2019-03-18 (×2): qty 2

## 2019-03-18 MED ORDER — ENSURE ENLIVE PO LIQD
237.0000 mL | Freq: Two times a day (BID) | ORAL | Status: DC
  Administered 2019-03-19 (×2): 237 mL via ORAL

## 2019-03-18 MED ORDER — CHLORHEXIDINE GLUCONATE CLOTH 2 % EX PADS: 6.0000 | MEDICATED_PAD | Freq: Once | CUTANEOUS | Status: DC

## 2019-03-18 MED ORDER — ALBUTEROL SULFATE (2.5 MG/3ML) 0.083% IN NEBU
3.0000 mL | INHALATION_SOLUTION | RESPIRATORY_TRACT | Status: DC | PRN
  Filled 2019-03-18: qty 3

## 2019-03-18 MED ORDER — ONDANSETRON HCL 4 MG/2ML IJ SOLN
INTRAMUSCULAR | Status: DC | PRN
  Administered 2019-03-18: 4 mg via INTRAVENOUS

## 2019-03-18 MED ORDER — OXYCODONE HCL 5 MG PO TABS
5.0000 mg | ORAL_TABLET | ORAL | Status: DC | PRN
  Administered 2019-03-18 – 2019-03-20 (×4): 10 mg via ORAL
  Administered 2019-03-21: 5 mg via ORAL
  Administered 2019-03-22 (×2): 10 mg via ORAL
  Filled 2019-03-18 (×2): qty 2
  Filled 2019-03-18: qty 1
  Filled 2019-03-18 (×4): qty 2

## 2019-03-18 MED ORDER — BUDESONIDE 0.5 MG/2ML IN SUSP: 0.5000 mg | Freq: Two times a day (BID) | RESPIRATORY_TRACT | Status: DC

## 2019-03-18 MED ORDER — ONDANSETRON 4 MG PO TBDP
4.0000 mg | ORAL_TABLET | Freq: Four times a day (QID) | ORAL | Status: DC | PRN
  Administered 2019-03-20 – 2019-03-22 (×5): 4 mg via ORAL
  Filled 2019-03-18 (×5): qty 1

## 2019-03-18 MED ORDER — FUROSEMIDE 20 MG PO TABS
20.0000 mg | ORAL_TABLET | Freq: Every day | ORAL | Status: DC
  Administered 2019-03-19 – 2019-04-04 (×17): 20 mg via ORAL
  Filled 2019-03-18 (×17): qty 1

## 2019-03-18 MED ORDER — DIPHENHYDRAMINE HCL 25 MG PO CAPS: 25.0000 mg | ORAL_CAPSULE | Freq: Four times a day (QID) | ORAL | Status: DC | PRN

## 2019-03-18 MED ORDER — CYCLOBENZAPRINE HCL 10 MG PO TABS
10.0000 mg | ORAL_TABLET | Freq: Every evening | ORAL | Status: DC | PRN
  Administered 2019-03-24: 10 mg via ORAL
  Filled 2019-03-18: qty 1

## 2019-03-18 MED ORDER — HYDROCODONE-ACETAMINOPHEN 7.5-325 MG PO TABS: 1.0000 | ORAL_TABLET | Freq: Once | ORAL | Status: DC | PRN

## 2019-03-18 MED ORDER — LIDOCAINE 2% (20 MG/ML) 5 ML SYRINGE
INTRAMUSCULAR | Status: DC | PRN
  Administered 2019-03-18: 100 mg via INTRAVENOUS

## 2019-03-18 MED ORDER — BUDESONIDE 0.5 MG/2ML IN SUSP
0.5000 mg | Freq: Two times a day (BID) | RESPIRATORY_TRACT | Status: DC
  Administered 2019-03-18 – 2019-03-19 (×2): 0.5 mg via RESPIRATORY_TRACT
  Filled 2019-03-18 (×2): qty 2

## 2019-03-18 MED ORDER — PROPOFOL 10 MG/ML IV BOLUS
INTRAVENOUS | Status: DC | PRN
  Administered 2019-03-18: 170 mg via INTRAVENOUS

## 2019-03-18 MED ORDER — SUCCINYLCHOLINE CHLORIDE 200 MG/10ML IV SOSY
PREFILLED_SYRINGE | INTRAVENOUS | Status: DC | PRN
  Administered 2019-03-18: 120 mg via INTRAVENOUS

## 2019-03-18 MED ORDER — MIDAZOLAM HCL 2 MG/2ML IJ SOLN
INTRAMUSCULAR | Status: AC
  Filled 2019-03-18: qty 2

## 2019-03-18 MED ORDER — HYDROMORPHONE HCL 1 MG/ML IJ SOLN
0.2500 mg | INTRAMUSCULAR | Status: DC | PRN
  Administered 2019-03-18: 0.25 mg via INTRAVENOUS

## 2019-03-18 MED ORDER — POTASSIUM CHLORIDE IN NACL 20-0.9 MEQ/L-% IV SOLN
INTRAVENOUS | Status: DC
  Administered 2019-03-18 – 2019-03-21 (×5): via INTRAVENOUS
  Filled 2019-03-18 (×8): qty 1000

## 2019-03-18 MED ORDER — HYDROMORPHONE HCL 1 MG/ML IJ SOLN
INTRAMUSCULAR | Status: AC
  Filled 2019-03-18: qty 1

## 2019-03-18 MED ORDER — NON FORMULARY
Status: DC | PRN
  Administered 2019-03-18: 450 mL via SURGICAL_CAVITY

## 2019-03-18 MED ORDER — ACETAMINOPHEN 325 MG PO TABS
650.0000 mg | ORAL_TABLET | Freq: Four times a day (QID) | ORAL | Status: DC | PRN
  Administered 2019-03-21 – 2019-04-03 (×10): 650 mg via ORAL
  Filled 2019-03-18 (×10): qty 2

## 2019-03-18 MED ORDER — SUGAMMADEX SODIUM 200 MG/2ML IV SOLN
INTRAVENOUS | Status: DC | PRN
  Administered 2019-03-18: 500 mg via INTRAVENOUS

## 2019-03-18 MED ORDER — LIDOCAINE 2% (20 MG/ML) 5 ML SYRINGE
INTRAMUSCULAR | Status: AC
  Filled 2019-03-18: qty 5

## 2019-03-18 MED ORDER — MOMETASONE FURO-FORMOTEROL FUM 200-5 MCG/ACT IN AERO: 2.0000 | INHALATION_SPRAY | Freq: Two times a day (BID) | RESPIRATORY_TRACT | Status: DC

## 2019-03-18 MED ORDER — PROPOFOL 10 MG/ML IV BOLUS
INTRAVENOUS | Status: AC
  Filled 2019-03-18: qty 20

## 2019-03-18 MED ORDER — MONTELUKAST SODIUM 10 MG PO TABS
10.0000 mg | ORAL_TABLET | Freq: Every day | ORAL | Status: DC
  Administered 2019-03-18 – 2019-04-03 (×16): 10 mg via ORAL
  Filled 2019-03-18 (×17): qty 1

## 2019-03-18 MED ORDER — MIDAZOLAM HCL 5 MG/5ML IJ SOLN
INTRAMUSCULAR | Status: DC | PRN
  Administered 2019-03-18: 2 mg via INTRAVENOUS

## 2019-03-18 MED ORDER — ONDANSETRON HCL 4 MG/2ML IJ SOLN
4.0000 mg | Freq: Four times a day (QID) | INTRAMUSCULAR | Status: DC | PRN
  Administered 2019-03-18 – 2019-04-02 (×9): 4 mg via INTRAVENOUS
  Filled 2019-03-18 (×8): qty 2

## 2019-03-18 MED ORDER — ERYTHROMYCIN 5 MG/GM OP OINT
1.0000 "application " | TOPICAL_OINTMENT | Freq: Every day | OPHTHALMIC | Status: DC
  Administered 2019-03-18 – 2019-04-03 (×15): 1 via OPHTHALMIC
  Filled 2019-03-18 (×2): qty 3.5

## 2019-03-18 MED ORDER — FENTANYL CITRATE (PF) 250 MCG/5ML IJ SOLN
INTRAMUSCULAR | Status: AC
  Filled 2019-03-18: qty 5

## 2019-03-18 MED ORDER — TRAMADOL HCL 50 MG PO TABS
50.0000 mg | ORAL_TABLET | Freq: Four times a day (QID) | ORAL | Status: DC | PRN
  Administered 2019-03-19 – 2019-04-02 (×12): 50 mg via ORAL
  Filled 2019-03-18 (×12): qty 1

## 2019-03-18 MED ORDER — ACETAMINOPHEN 10 MG/ML IV SOLN
1000.0000 mg | Freq: Once | INTRAVENOUS | Status: DC | PRN
  Administered 2019-03-18: 1000 mg via INTRAVENOUS

## 2019-03-18 MED ORDER — NYSTATIN 100000 UNIT/GM EX POWD
1.0000 g | Freq: Three times a day (TID) | CUTANEOUS | Status: DC | PRN
  Filled 2019-03-18: qty 15

## 2019-03-18 MED ORDER — FAMOTIDINE 20 MG PO TABS
20.0000 mg | ORAL_TABLET | Freq: Two times a day (BID) | ORAL | Status: DC
  Administered 2019-03-18 – 2019-04-04 (×33): 20 mg via ORAL
  Filled 2019-03-18 (×34): qty 1

## 2019-03-18 MED ORDER — ROCURONIUM BROMIDE 10 MG/ML (PF) SYRINGE
PREFILLED_SYRINGE | INTRAVENOUS | Status: DC | PRN
  Administered 2019-03-18: 60 mg via INTRAVENOUS

## 2019-03-18 MED ORDER — MORPHINE SULFATE (PF) 2 MG/ML IV SOLN
2.0000 mg | INTRAVENOUS | Status: DC | PRN
  Administered 2019-03-18: 4 mg via INTRAVENOUS
  Administered 2019-03-19: 2 mg via INTRAVENOUS
  Administered 2019-03-19 (×2): 4 mg via INTRAVENOUS
  Administered 2019-03-22 – 2019-04-04 (×2): 2 mg via INTRAVENOUS
  Filled 2019-03-18: qty 2
  Filled 2019-03-18: qty 1
  Filled 2019-03-18 (×2): qty 2
  Filled 2019-03-18 (×2): qty 1
  Filled 2019-03-18: qty 2

## 2019-03-18 MED ORDER — MEPERIDINE HCL 25 MG/ML IJ SOLN: 6.2500 mg | INTRAMUSCULAR | Status: DC | PRN

## 2019-03-18 MED ORDER — DEXAMETHASONE SODIUM PHOSPHATE 10 MG/ML IJ SOLN
INTRAMUSCULAR | Status: DC | PRN
  Administered 2019-03-18: 5 mg via INTRAVENOUS

## 2019-03-18 SURGICAL SUPPLY — 42 items
APL PRP STRL LF DISP 70% ISPRP (MISCELLANEOUS)
BLADE CLIPPER SURG (BLADE) IMPLANT
CANISTER SUCT 3000ML PPV (MISCELLANEOUS) ×4 IMPLANT
CANISTER WOUNDNEG PRESSURE 500 (CANNISTER) ×2 IMPLANT
CHLORAPREP W/TINT 26 (MISCELLANEOUS) ×2 IMPLANT
COVER SURGICAL LIGHT HANDLE (MISCELLANEOUS) ×4 IMPLANT
COVER WAND RF STERILE (DRAPES) ×2 IMPLANT
DRAPE LAPAROSCOPIC ABDOMINAL (DRAPES) ×4 IMPLANT
DRAPE WARM FLUID 44X44 (DRAPES) ×2 IMPLANT
DRSG OPSITE POSTOP 4X10 (GAUZE/BANDAGES/DRESSINGS) IMPLANT
DRSG OPSITE POSTOP 4X8 (GAUZE/BANDAGES/DRESSINGS) IMPLANT
DRSG VAC ATS SM SENSATRAC (GAUZE/BANDAGES/DRESSINGS) ×2 IMPLANT
ELECT BLADE 6.5 EXT (BLADE) ×2 IMPLANT
ELECT CAUTERY BLADE 6.4 (BLADE) ×8 IMPLANT
ELECT REM PT RETURN 9FT ADLT (ELECTROSURGICAL) ×4
ELECTRODE REM PT RTRN 9FT ADLT (ELECTROSURGICAL) ×2 IMPLANT
GLOVE BIO SURGEON STRL SZ7 (GLOVE) ×4 IMPLANT
GLOVE BIOGEL PI IND STRL 7.5 (GLOVE) ×2 IMPLANT
GLOVE BIOGEL PI INDICATOR 7.5 (GLOVE) ×2
GOWN STRL REUS W/ TWL LRG LVL3 (GOWN DISPOSABLE) ×4 IMPLANT
GOWN STRL REUS W/TWL LRG LVL3 (GOWN DISPOSABLE) ×8
HANDLE SUCTION POOLE (INSTRUMENTS) ×2 IMPLANT
KIT BASIN OR (CUSTOM PROCEDURE TRAY) ×4 IMPLANT
KIT TURNOVER KIT B (KITS) ×4 IMPLANT
LIGASURE IMPACT 36 18CM CVD LR (INSTRUMENTS) IMPLANT
NS IRRIG 1000ML POUR BTL (IV SOLUTION) ×8 IMPLANT
PACK GENERAL/GYN (CUSTOM PROCEDURE TRAY) ×4 IMPLANT
PAD ARMBOARD 7.5X6 YLW CONV (MISCELLANEOUS) ×4 IMPLANT
PENCIL SMOKE EVACUATOR (MISCELLANEOUS) ×4 IMPLANT
SPECIMEN JAR LARGE (MISCELLANEOUS) IMPLANT
SPONGE LAP 18X18 RF (DISPOSABLE) IMPLANT
STAPLER VISISTAT 35W (STAPLE) ×4 IMPLANT
SUCTION POOLE HANDLE (INSTRUMENTS)
SUT PDS AB 1 TP1 96 (SUTURE) ×8 IMPLANT
SUT SILK 2 0 SH CR/8 (SUTURE) ×4 IMPLANT
SUT SILK 2 0 TIES 10X30 (SUTURE) ×4 IMPLANT
SUT SILK 3 0 SH CR/8 (SUTURE) ×4 IMPLANT
SUT SILK 3 0 TIES 10X30 (SUTURE) ×4 IMPLANT
SUT VIC AB 3-0 SH 18 (SUTURE) ×2 IMPLANT
TOWEL GREEN STERILE (TOWEL DISPOSABLE) ×4 IMPLANT
TRAY FOLEY MTR SLVR 16FR STAT (SET/KITS/TRAYS/PACK) IMPLANT
YANKAUER SUCT BULB TIP NO VENT (SUCTIONS) IMPLANT

## 2019-03-18 NOTE — Anesthesia Procedure Notes (Signed)
Procedure Name: Intubation Date/Time: 03/18/2019 9:20 AM Performed by: Myna Bright, CRNA Pre-anesthesia Checklist: Patient identified, Emergency Drugs available, Suction available and Patient being monitored Patient Re-evaluated:Patient Re-evaluated prior to induction Oxygen Delivery Method: Circle system utilized Preoxygenation: Pre-oxygenation with 100% oxygen Induction Type: IV induction Ventilation: Mask ventilation without difficulty Laryngoscope Size: Mac and 3 Grade View: Grade I Tube type: Oral Tube size: 7.0 mm Number of attempts: 1 Airway Equipment and Method: Stylet Placement Confirmation: ETT inserted through vocal cords under direct vision,  positive ETCO2 and breath sounds checked- equal and bilateral Secured at: 21 cm Tube secured with: Tape Dental Injury: Teeth and Oropharynx as per pre-operative assessment

## 2019-03-18 NOTE — Interval H&P Note (Signed)
History and Physical Interval Note:  03/18/2019 7:29 AM  Kelsey Bullock  has presented today for surgery, with the diagnosis of ABDOMINAL WOUND INFECTION.  The various methods of treatment have been discussed with the patient and family. After consideration of risks, benefits and other options for treatment, the patient has consented to  Procedure(s): EXPLORATORY LAPAROTOMY (N/A) ABDOMINAL WOUND EXPLORATION (N/A) as a surgical intervention.  The patient's history has been reviewed, patient examined, no change in status, stable for surgery.  I have reviewed the patient's chart and labs.  Questions were answered to the patient's satisfaction.     Maia Petties

## 2019-03-18 NOTE — Op Note (Signed)
Preop diagnosis: Postoperative wound infection down to the fascia of the abdominal wall Postop diagnosis: Same Procedure performed: Abdominal wound exploration, placement of wound VAC 3 x 6 x 12 cm deep Surgeon:Shanterica Biehler K Halima Fogal Anesthesia: General Indications:This is a 58 year old female with a BMI of 68 who presents with an incarcerated infraumbilical ventral hernia with small bowel obstruction. Apparently the patient had some type of laparoscopic procedure performed by GYN about 20 years ago. This was converted to an open procedure. She did not realize that she had a hernia until she came to the ER yesterday. She has had a couple of days of distention, nausea, vomiting, and no bowel movements. CT scan showed a large incarcerated umbilical hernia with small bowel obstruction. The fascial defect appears relatively small.   She was admitted to the hospital on 02/02/19. The next morning, she underwent exploratory laparotomy, small bowel resection, and primary repair of the ventral hernia. We resected a large amount of omentum from the hernia sac. We also resected approximately 20 cm of ischemic small bowel. The hernia defect measured about 4 cm across and was closed with #1Novafil sutures. A drain was placed in the large dead space in the subcutaneous tissue. She progressed slowly and was finally discharged on 02/11/19. The drain was removed prior to discharge.  She was seen on 02/18/19. We opened her wound and evacuated large subcutaneous wound infection. She has been performing twice a day dressing changes. She still has some drainage. She remains afebrile.  She has been having daily bowel movements.  She was reevaluated on 02/24/19. The wound was clean her but remains quite deep, it extends about 10 cm deep and superiorly to the fascia. I cannot visualize the fascia but probing with a cotton swab shows that the fascia seems to be intact. She continues to have persistent intermittent pain  in this area. Recent CBC showed a normal white blood cell count and normal hemoglobin. Electrolytes were also within normal limits. A CT scan was obtained.  "1. 6 x 4 x 4 cm collection of gas and debris identified in the deep subcutaneous fat of the lower anterior abdominal wall at the site of hernia repair. This is compatible with the patient's known open wound. Just deep to this wound and deep to the rectus compartment, there is a focus of loculated or contained fat density with a rim of ill-defined soft tissue attenuation and several gas locules. This process tracks cranially behind the rectus sheath and is probably in the preperitoneal space, but extension through the peritoneum into the anterior peritoneal cavity/omentum cannot be excluded by CT. There is no associated fluid collection to suggest drainable abscess. Gas would be unexpected given the patient is more than 1 month out from surgery and communication to the superficial wound would be a primary consideration. Superinfection of this region could also generate gas."  Description of procedure: The patient was brought to the operating room and placed in the supine position on the operating room table.  After an adequate level of general anesthesia was obtained, her entire abdomen was prepped with Betadine and draped in sterile fashion.  A timeout was taken to ensure the proper patient and proper procedure.  The patient has a 3 x 6 cm granulated wound below the umbilicus.  There is a central portion that tracks very deep for a distance of at least 12 cm.  I extended our incision superiorly and divided the granulation tissue down to the fascia.  2 of the fascial sutures  are visualized.  There are no longer in the fascia.  I remove these.  There seems to be some small gaps in the fascial closure.  There is some necrotic fascia that I debrided sharply with cautery.  There is no purulence.  There is no succus entericus or stool noted in the  wound.  I irrigated the wound thoroughly with Irricept solution.  Hemostasis was good.  No further necrotic tissue was identified.  I cut a VAC sponge with a extension extending down all the way to the base of the wound.  This was placed in the wound and sealed with an occlusive drape.  This was placed to 125 mmHg suction.  The patient was then extubated and brought to the recovery room in stable condition.  All sponge, instrument, and needle counts are correct.  Imogene Burn. Georgette Dover, MD, [facility] Surgery  General/ Trauma Surgery Beeper [phone]  03/18/2019 10:08 AM

## 2019-03-18 NOTE — Transfer of Care (Signed)
Immediate Anesthesia Transfer of Care Note  Patient: Kelsey Bullock  Procedure(s) Performed: ABDOMINAL WOUND EXPLORATION (N/A ) Application Of Wound Vac (N/A Abdomen)  Patient Location: PACU  Anesthesia Type:General  Level of Consciousness: awake, alert , oriented and patient cooperative  Airway & Oxygen Therapy: Patient Spontanous Breathing and Patient connected to face mask oxygen  Post-op Assessment: Report given to RN, Post -op Vital signs reviewed and stable and Patient moving all extremities  Post vital signs: Reviewed and stable  Last Vitals:  Vitals Value Taken Time  BP 116/61 03/18/19 1003  Temp 36.3 C 03/18/19 1003  Pulse 81 03/18/19 1008  Resp 17 03/18/19 1008  SpO2 99 % 03/18/19 1008  Vitals shown include unvalidated device data.  Last Pain:  Vitals:   03/18/19 1003  PainSc: 7       Patients Stated Pain Goal: 8 ([id number])  Complications: No apparent anesthesia complications

## 2019-03-18 NOTE — Anesthesia Postprocedure Evaluation (Signed)
Anesthesia Post Note  Patient: Kelsey Bullock  Procedure(s) Performed: ABDOMINAL WOUND EXPLORATION (N/A ) Application Of Wound Vac (N/A Abdomen)     Patient location during evaluation: PACU Anesthesia Type: General Level of consciousness: awake and alert Pain management: pain level controlled Vital Signs Assessment: post-procedure vital signs reviewed and stable Respiratory status: spontaneous breathing, nonlabored ventilation, respiratory function stable and patient connected to nasal cannula oxygen Cardiovascular status: blood pressure returned to baseline and stable Postop Assessment: no apparent nausea or vomiting Anesthetic complications: no    Last Vitals:  Vitals:   03/18/19 1103 03/18/19 1133  BP: 116/66 108/62  Pulse: 77 70  Resp: 16 16  Temp:    SpO2: 97% 95%    Last Pain:  Vitals:   03/18/19 1133  PainSc: [place] A Izaiah Tabb

## 2019-03-18 NOTE — Progress Notes (Signed)
Patient asked this RN to notify case management for her sister regarding her need for a "350-400lb limit lift chair" and meals on wheels; Informed patient that MD would consult case management to assess if these needs can be met. Patient verbalized understanding. Will mention this request in progression.

## 2019-03-18 NOTE — Anesthesia Preprocedure Evaluation (Addendum)
Anesthesia Evaluation  Patient identified by MRN, date of birth, ID band Patient awake    Reviewed: Allergy & Precautions, NPO status , Patient's Chart, lab work & pertinent test results  History of Anesthesia Complications (+) PONV  Airway Mallampati: II  TM Distance: >3 FB Neck ROM: Full    Dental no notable dental hx. (+) Teeth Intact, Dental Advisory Given   Pulmonary asthma , sleep apnea ,    Pulmonary exam normal breath sounds clear to auscultation       Cardiovascular Exercise Tolerance: Good hypertension, Pt. on medications negative cardio ROS Normal cardiovascular exam Rhythm:Regular Rate:Normal     Neuro/Psych  Headaches, PSYCHIATRIC DISORDERS Anxiety Depression  Neuromuscular disease    GI/Hepatic Neg liver ROS, GERD  ,  Endo/Other  diabetesHyperthyroidism   Renal/GU negative Renal ROS     Musculoskeletal negative musculoskeletal ROS (+) Arthritis ,   Abdominal   Peds  Hematology Hgb 11.5 Plt 221   Anesthesia Other Findings   Reproductive/Obstetrics                                                             Anesthesia Evaluation  Patient identified by MRN, date of birth, ID band Patient awake    Reviewed: Allergy & Precautions, NPO status , Patient's Chart, lab work & pertinent test results  History of Anesthesia Complications (+) PONVNegative for: history of anesthetic complications  Airway Mallampati: II  TM Distance: >3 FB Neck ROM: Full    Dental  (+) Teeth Intact   Pulmonary asthma , sleep apnea ,    Pulmonary exam normal        Cardiovascular hypertension, Pt. on medications +CHF (diastolic)  Normal cardiovascular exam     Neuro/Psych PSYCHIATRIC DISORDERS Anxiety Depression negative neurological ROS     GI/Hepatic Neg liver ROS, GERD  ,  Endo/Other  diabetes, Type 2Morbid obesity  Renal/GU negative Renal ROS  negative  genitourinary   Musculoskeletal  (+) Fibromyalgia -  Abdominal (+) + obese,   Peds  Hematology negative hematology ROS (+)   Anesthesia Other Findings   Reproductive/Obstetrics                           Anesthesia Physical Anesthesia Plan  ASA: III  Anesthesia Plan: General   Post-op Pain Management:    Induction: Intravenous, Rapid sequence and Cricoid pressure planned  PONV Risk Score and Plan: 4 or greater and Ondansetron, Dexamethasone, Treatment may vary due to age or medical condition and Midazolam  Airway Management Planned: Oral ETT  Additional Equipment: None  Intra-op Plan:   Post-operative Plan: Extubation in OR  Informed Consent: I have reviewed the patients History and Physical, chart, labs and discussed the procedure including the risks, benefits and alternatives for the proposed anesthesia with the patient or authorized representative who has indicated his/her understanding and acceptance.     Dental advisory given  Plan Discussed with:   Anesthesia Plan Comments:        Anesthesia Quick Evaluation  Anesthesia Physical Anesthesia Plan  ASA: III  Anesthesia Plan: General   Post-op Pain Management:    Induction: Intravenous, Cricoid pressure planned and Rapid sequence  PONV Risk Score and Plan: 4 or greater and Treatment may vary due to  age or medical condition, Ondansetron and Dexamethasone  Airway Management Planned: Oral ETT  Additional Equipment:   Intra-op Plan:   Post-operative Plan: Extubation in OR  Informed Consent: I have reviewed the patients History and Physical, chart, labs and discussed the procedure including the risks, benefits and alternatives for the proposed anesthesia with the patient or authorized representative who has indicated his/her understanding and acceptance.     Dental advisory given  Plan Discussed with: CRNA  Anesthesia Plan Comments:         Anesthesia Quick  Evaluation

## 2019-03-19 ENCOUNTER — Encounter (HOSPITAL_COMMUNITY): Payer: Self-pay | Admitting: Surgery

## 2019-03-19 LAB — CBC
HCT: 36.1 % (ref 36.0–46.0)
Hemoglobin: 11.3 g/dL — ABNORMAL LOW (ref 12.0–15.0)
MCH: 28 pg (ref 26.0–34.0)
MCHC: 31.3 g/dL (ref 30.0–36.0)
MCV: 89.4 fL (ref 80.0–100.0)
Platelets: 216 10*3/uL (ref 150–400)
RBC: 4.04 MIL/uL (ref 3.87–5.11)
RDW: 14.6 % (ref 11.5–15.5)
WBC: 3.7 10*3/uL — ABNORMAL LOW (ref 4.0–10.5)
nRBC: 0 % (ref 0.0–0.2)

## 2019-03-19 LAB — BASIC METABOLIC PANEL
Anion gap: 10 (ref 5–15)
BUN: 7 mg/dL (ref 6–20)
CO2: 21 mmol/L — ABNORMAL LOW (ref 22–32)
Calcium: 8.5 mg/dL — ABNORMAL LOW (ref 8.9–10.3)
Chloride: 108 mmol/L (ref 98–111)
Creatinine, Ser: 0.55 mg/dL (ref 0.44–1.00)
GFR calc Af Amer: >60 mL/min (ref >60)
GFR calc non Af Amer: >60 mL/min (ref >60)
Glucose, Bld: 134 mg/dL — ABNORMAL HIGH (ref 70–99)
Potassium: 3.9 mmol/L (ref 3.5–5.1)
Sodium: 139 mmol/L (ref 135–145)

## 2019-03-19 LAB — SARS CORONAVIRUS 2 BY RT PCR (HOSPITAL ORDER, PERFORMED IN ~~LOC~~ HOSPITAL LAB): SARS Coronavirus 2: POSITIVE — AB

## 2019-03-19 MED ORDER — ENSURE ENLIVE PO LIQD
237.0000 mL | Freq: Three times a day (TID) | ORAL | Status: DC
  Administered 2019-03-20 – 2019-04-04 (×37): 237 mL via ORAL

## 2019-03-19 MED ORDER — ADULT MULTIVITAMIN W/MINERALS CH
1.0000 | ORAL_TABLET | Freq: Every day | ORAL | Status: DC
  Administered 2019-03-20 – 2019-04-04 (×16): 1 via ORAL
  Filled 2019-03-19 (×16): qty 1

## 2019-03-19 NOTE — TOC Initial Note (Addendum)
Transition of Care [facility]) - Initial/Assessment Note    Patient Details  Name: Kelsey Bullock MRN: [id number] Date of Birth: 1960-11-29  Transition of Care [facility]) CM/SW Contact:    Marilu Favre, RN Phone Number: 03/19/2019, 10:48 AM  Clinical Narrative:     Confirmed face sheet information with patient. NCM knows patient from last admission.   Faxed completed  paperwork to  K patient would have to agree to stay for 30 days and may [facility]CI for home VAC. Explained if insurance approves [facility] will deliver home [facility] and supplies to patient hospital room, if patient is reasonable for any cost [facility] will explain on delivery. Bedside nurse will change patient from hospital VAC to home [facility] on day of discharge. Home health RN will change home VAC three times a week.  Patient has been staying with her sister Wynetta Fines : [address] , [place].   Patient requested NCM to call Collie Siad. NCM called Collie Siad and with her permission placed her on speaker phone in patient's room.   Collie Siad has recently tested positive for covid. Collie Siad asking NCM to arrange 24 / 7 care for [name] at [place] home at discharge.   Explained NCM is unable to do so. Patient can private pay or apply for personal care services through her medicaid / PCP , however there is a wait list .   NCM offered [facility] , however with Medicaid  She would have to agree to stay for 30 days , and may lose her monthly cheque.     Patient does have an apartment that her son lives in [place] address) , however, Eimile and Collie Siad state her son is 86 doesn't cook and does not Advertising account planner. Lynsee's daughter lives in [place]. Collie Siad has called the daughter , however she is unable to come to assist.   After discussion patient and Teneeshia decided patient will discharge to [place] home at discharge. NCM called Tiffany with [facility] will check with her office to confirm they can make home health visits. ( she believes they can). Awaiting call back.  Collie Siad asking  NCM to arrange a lift reclincer for patient. Explained to Arleigh and Collie Siad NCM will enter an order in Epic and ask MD to sign. Once signed NCM will print order and give to patient. Due to so many different options for lift chair they will have to go to a DME store or order on line and then submit MD order to Medicaid.   Patient asking for assistance for washer and dryer and cell phone. Patient gave NCM permission to enter in [place].   [place] and Collie Siad voiced understanding to all of above.     Tiffany with [facility] aware patient is Covid positive and can still accept.   Once NCM knows patient's new room number will update [facility]  Update Tiffany with [facility] called , they are discontinuing services. Tiffany will call patient in morning to notify her.   Expected Discharge Plan: [place] Barriers to Discharge: Continued Medical Work up   Patient Goals and CMS Choice Patient states their goals for this hospitalization and ongoing recovery are:: to go home CMS Medicare.gov Compare Post Acute Care list provided to:: Patient Choice offered to / list presented to : Patient  Expected Discharge Plan and Services Expected Discharge Plan: [place] Choice: [place] arrangements for the past  2 months: Single Family Home                 DME Arranged: N/A         HH Arranged: RN, PT [place] Agency: [facility] (now [facility]) Date [place]: 03/19/19 Time [place]: 65 Representative spoke with at [place]: [place] Arrangements/Services Living arrangements for the past 2 months: [place] Lives with:: Siblings Patient language and need for interpreter reviewed:: Yes Do you feel safe going back to the place where you live?: Yes      Need for Family Participation in Patient Care: Yes (Comment) Care giver support system in place?: Yes (comment) Current home  services: DME, Home PT Criminal Activity/Legal Involvement Pertinent to Current Situation/Hospitalization: No - Comment as needed  Activities of Daily Living Home Assistive Devices/Equipment: Cane (specify quad or straight), Walker (specify type) ADL Screening (condition at time of admission) Patient's cognitive ability adequate to safely complete daily activities?: Yes Is the patient deaf or have difficulty hearing?: No Does the patient have difficulty seeing, even when wearing glasses/contacts?: No Does the patient have difficulty concentrating, remembering, or making decisions?: Yes Patient able to express need for assistance with ADLs?: Yes Does the patient have difficulty dressing or bathing?: No Independently performs ADLs?: Yes (appropriate for developmental age) Does the patient have difficulty walking or climbing stairs?: Yes Weakness of Legs: Both Weakness of Arms/Hands: None  Permission Sought/Granted   Permission granted to share information with : Yes, Verbal Permission Granted  Share Information with NAME: Collie Siad [id number]           Emotional Assessment Appearance:: Appears stated age Attitude/Demeanor/Rapport: Crying Affect (typically observed): Accepting Orientation: : Oriented to Situation, Oriented to  Time, Oriented to Place, Oriented to Self Alcohol / Substance Use: Not Applicable Psych Involvement: No (comment)  Admission diagnosis:  ABDOMINAL WOUND INFECTION Patient Active Problem List   Diagnosis Date Noted  . Chronic abdominal wound infection 03/18/2019  . Wound disruption, post-op, skin, subsequent encounter 02/28/2019  . Hypoalbuminemia due to protein-calorie malnutrition ([place]) 02/27/2019  . Moderate persistent asthma without complication   . Paronychia of great toe of left foot 08/16/2018  . Hx of adenomatous colonic polyps   . Chronic diarrhea 03/28/2018  . Pilonidal cyst 02/22/2018  . Diastolic CHF with preserved left ventricular  function, NYHA class 2 ([place]) 12/14/2016  . Costochondritis 03/30/2016  . Allergic rhinitis 02/06/2015  . GERD (gastroesophageal reflux disease) 02/06/2015  . Severe persistent asthma 02/06/2015  . Intertrigo 10/22/2014  . Depression, major, recurrent ([place]) 02/19/2014  . Controlled type 2 diabetes mellitus with neuropathy ([place]) 01/25/2011  . SLEEP APNEA 03/10/2009  . Seborrheic eczema 07/17/2007  . LOW BACK PAIN SYNDROME 11/26/2006  . HYPERCHOLESTEROLEMIA 08/02/2006  . Morbid obesity ([place]) 08/02/2006  . ANXIETY 08/02/2006  . PSEUDOTUMOR CEREBRI 08/02/2006  . HYPERTENSION, BENIGN SYSTEMIC 08/02/2006  . OSTEOARTHRITIS, LOWER LEG 08/02/2006  . FIBROMYALGIA, FIBROMYOSITIS 08/02/2006   PCP:  [name], MD Pharmacy:   [facility] [place] [address] Phone: [phone] Fax: [facility] [place], [place] [address] Phone: [phone] Fax: [phone]     Social Determinants of Health (SDOH) Interventions    Readmission Risk Interventions No flowsheet data found.

## 2019-03-19 NOTE — Plan of Care (Signed)
  Problem: Education: Goal: Knowledge of General Education information will improve Description: Including pain rating scale, medication(s)/side effects and non-pharmacologic comfort measures Outcome: Progressing   Problem: Pain Managment: Goal: General experience of comfort will improve Outcome: Progressing   

## 2019-03-19 NOTE — Progress Notes (Signed)
Attempt to call report to 2W made.

## 2019-03-19 NOTE — Progress Notes (Signed)
Patient stated sister's boyfriend came to visit from out of town. Stated her sister and the boyfriend were doing yard work when the sister became symptomatic. Sister went to get test for Covid. Patient was made aware of sister's positive results today 03/19/2019. Upon my assessment of the patient she was asymptomatic. Denied sore throat, cough or sneezing. Microbiology called to confirm positive test results for the patient. Janett Billow, [place], Charge Nurse Drue Dun and Blanch Media, [place] made aware of those results. Will continue to monitor the patient.

## 2019-03-19 NOTE — Progress Notes (Signed)
Initial Nutrition Assessment  RD working remotely.  DOCUMENTATION CODES:   Morbid obesity  INTERVENTION:   -Increase Ensure Enlive po TID, each supplement provides 350 kcal and 20 grams of protein -MVI with minerals daily  NUTRITION DIAGNOSIS:   Increased nutrient needs related to wound healing as evidenced by estimated needs.  GOAL:   Patient will meet greater than or equal to 90% of their needs  MONITOR:   PO intake, Supplement acceptance, Labs, Weight trends, Skin, I & O's  REASON FOR ASSESSMENT:   Malnutrition Screening Tool    ASSESSMENT:   58 year old female with a BMI of 76 who presents with an incarcerated infraumbilical ventral hernia with small bowel obstruction.  Apparently the patient had some type of laparoscopic procedure performed by GYN about 20 years ago.  This was converted to an open procedure.  She did not realize that she had a hernia until she came to the ER yesterday.  She has had a couple of days of distention, nausea, vomiting, and no bowel movements.  CT scan showed a large incarcerated umbilical hernia with small bowel obstruction.  The fascial defect appears relatively small.  Pt admitted with wound infection s/p surgery.   10/13- s/p Procedure performed: Abdominal wound exploration, placement of wound VAC 3 x 6 x 12 cm deep  Reviewed I/O's: +2.6 L x 24 hours  Pt placed on airborne and contact precautions due to pt's sister (whom she lives with) testing positive for COVID today. In an effort to preserve PPE, RD did not enter room. RD attempted to speak with pt via phone call, however, no answer. RD unable to obtain further nutrition-related history at this time.   Reviewed meal completion records- meal completion 0%. Per MAR, pt is accepting Ensure supplements.   Per chart review, outpatient RD Iver Nestle) met with pt in 2012 for weight management. Reviewed wt hx; noted pt has experienced a 18% wt loss over the past 6 months, which is  significant for time frame. Slow, moderate weight loss is favorable given pt's morbid obesity.   Medications reviewed and include 0.9% NaCl with KCL 20 mEq/L infusion @ 75 ml/hr.   Per MD notes, plan to d/c home soon with wound vac.   Labs reviewed.   Diet Order:   Diet Order            Diet Heart Room service appropriate? Yes; Fluid consistency: Thin  Diet effective now              EDUCATION NEEDS:   No education needs have been identified at this time  Skin:  Skin Assessment: Skin Integrity Issues: Skin Integrity Issues:: Wound VAC Wound Vac: abdomen  Last BM:  03/17/19  Height:   Ht Readings from Last 1 Encounters:  02/03/19 '5\' 4"'  (1.626 m)    Weight:   Wt Readings from Last 1 Encounters:  02/27/19 (!) 153.3 kg    Ideal Body Weight:  54.5 kg  BMI:  Estimated body mass index is 58.02 kg/m as calculated from the following:   Height as of 02/03/19: '5\' 4"'  (1.626 m).   Weight as of 02/27/19: 153.3 kg.  Estimated Nutritional Needs:   Kcal:  1700-1900  Protein:  120-135 grams  Fluid:  > 1.7 L    Ronte Parker A. Jimmye Norman, RD, LDN, [place] Registered Dietitian II Certified Diabetes Care and Education Specialist Pager: [phone] After hours Pager: [phone]

## 2019-03-19 NOTE — Progress Notes (Addendum)
[facility]/Trauma Progress Note  1 Day Post-Op   Assessment/Plan  OSA - refusing CPAP, doesn't wear one at home HTN - home meds Asthma - nebs PRN, home meds Anxiety - home klonopin CHF - lasix daily S/P ex lap, SBR and primary repair of ventral hernia 02/03/2019 Open midline wound 02/18/19  Wound infection after surgery - S/P Abdominal wound exploration, placement of wound VAC 3 x 6 x 12 cm deep, Dr. Georgette Dover, 10/13 - will change vac tomorrow and evaluate wound  FEN: HH VTE: SCD's, lovenox ID: Vanc & Diflucan once Foley: none Follow up: TBD  DISPO: change vac tomorrow if looks ok and HH is set up she may be okay for discharge tomorrow  Addendum: nurse paged me to inform me that the sister that the patient lives with just tested positive for COVID today. I ordered a rapid COVID test and placed the pt on contact precautions.   LOS: 1 day    Subjective: CC: pain at wound site  No issues overnight. No vomiting, fever or chills. Slept ok.  Objective: Vital signs in last 24 hours: Temp:  [97.3 F (36.3 C)-98.4 F (36.9 C)] 97.7 F (36.5 C) (10/14 0551) Pulse Rate:  [61-84] 75 (10/14 0551) Resp:  [11-24] 18 (10/14 0551) BP: (98-127)/(57-80) 127/80 (10/14 0551) SpO2:  [91 %-100 %] 96 % (10/14 0740) Last BM Date: 03/17/19  Intake/Output from previous day: 10/13 0701 - 10/14 0700 In: 1661.4 [P.O.:130; I.V.:1531.4] Out: 20 [Blood:20] Intake/Output this shift: No intake/output data recorded.  PE: Gen:  Alert, NAD, pleasant, cooperative Card:  RRR, no M/G/R heard Pulm:  CTA, no W/R/R, effort normal Abd: Soft, obese, ND, +BS, wound vac of midline wound. TTP around this vac otherwise nontender abdomen Skin: no rashes noted, warm and dry   Anti-infectives: Anti-infectives (From admission, onward)   Start     Dose/Rate Route Frequency Ordered Stop   03/18/19 1215  fluconazole (DIFLUCAN) tablet 100 mg  Status:  Discontinued     100 mg Oral Daily 03/18/19  1202 03/18/19 1241   03/18/19 0400  vancomycin (VANCOCIN) 1,500 mg in sodium chloride 0.9 % 500 mL IVPB     1,500 mg 250 mL/hr over 120 Minutes Intravenous On call to O.R. 03/17/19 0703 03/18/19 1054      Lab Results:  Recent Labs    03/18/19 0842 03/19/19 0235  WBC 4.1 3.7*  HGB 11.5* 11.3*  HCT 35.8* 36.1  PLT 221 216   BMET Recent Labs    03/18/19 0842 03/19/19 0235  NA 138 139  K 3.5 3.9  CL 104 108  CO2 23 21*  GLUCOSE 100* 134*  BUN 8 7  CREATININE 0.73 0.55  CALCIUM 8.7* 8.5*   PT/INR No results for input(s): LABPROT, INR in the last 72 hours. CMP     Component Value Date/Time   NA 139 03/19/2019 0235   NA 142 02/27/2019 1314   K 3.9 03/19/2019 0235   CL 108 03/19/2019 0235   CO2 21 (L) 03/19/2019 0235   GLUCOSE 134 (H) 03/19/2019 0235   BUN 7 03/19/2019 0235   BUN 10 02/27/2019 1314   CREATININE 0.55 03/19/2019 0235   CREATININE 0.64 08/26/2015 0900   CALCIUM 8.5 (L) 03/19/2019 0235   PROT 6.8 02/27/2019 1314   ALBUMIN 3.9 02/27/2019 1314   AST 17 02/27/2019 1314   ALT 18 02/27/2019 1314   ALKPHOS 112 02/27/2019 1314   BILITOT 0.8 02/27/2019 1314   GFRNONAA >60 03/19/2019 0235  GFRNONAA >89 08/26/2015 0900   GFRAA >60 03/19/2019 0235   GFRAA >89 08/26/2015 0900   Lipase     Component Value Date/Time   LIPASE 22 02/02/2019 1810    Studies/Results: No results found.   Kalman Drape, [facility] Surgery Pager [phone] Cristine Polio, & Friday 7:00am - 4:30pm Thursdays 7:00am -11:30am

## 2019-03-19 NOTE — Plan of Care (Signed)
  Problem: Health Behavior/Discharge Planning: Goal: Ability to manage health-related needs will improve Outcome: Progressing   Problem: Clinical Measurements: Goal: Ability to maintain clinical measurements within normal limits will improve Outcome: Progressing   

## 2019-03-20 MED ORDER — ALBUTEROL SULFATE HFA 108 (90 BASE) MCG/ACT IN AERS
1.0000 | INHALATION_SPRAY | RESPIRATORY_TRACT | Status: DC | PRN
  Administered 2019-03-20: 2 via RESPIRATORY_TRACT
  Administered 2019-03-22: 1 via RESPIRATORY_TRACT
  Administered 2019-03-24 – 2019-03-31 (×5): 2 via RESPIRATORY_TRACT
  Filled 2019-03-20: qty 6.7

## 2019-03-20 MED ORDER — BUDESONIDE 180 MCG/ACT IN AEPB
1.0000 | INHALATION_SPRAY | Freq: Two times a day (BID) | RESPIRATORY_TRACT | Status: DC
  Administered 2019-03-20 – 2019-04-04 (×30): 1 via RESPIRATORY_TRACT
  Filled 2019-03-20: qty 1

## 2019-03-20 MED ORDER — ALBUTEROL SULFATE HFA 108 (90 BASE) MCG/ACT IN AERS: 1.0000 | INHALATION_SPRAY | RESPIRATORY_TRACT | Status: DC | PRN

## 2019-03-20 NOTE — Plan of Care (Signed)
  Problem: Health Behavior/Discharge Planning: Goal: Ability to manage health-related needs will improve 03/20/2019 0004 by Fredirick Maudlin, RN Outcome: Progressing 03/19/2019 2125 by Fredirick Maudlin, RN Outcome: Progressing   Problem: Clinical Measurements: Goal: Ability to maintain clinical measurements within normal limits will improve Outcome: Progressing Goal: Respiratory complications will improve Outcome: Progressing   Problem: Activity: Goal: Risk for activity intolerance will decrease Outcome: Progressing

## 2019-03-20 NOTE — Progress Notes (Signed)
[facility] Progress Note  2 Days Post-Op  Subjective: CC: pain at wound Pain at wound. Seen with WOC, tolerated dressing change well.   Objective: Vital signs in last 24 hours: Temp:  [98.7 F (37.1 C)-98.8 F (37.1 C)] 98.8 F (37.1 C) (10/15 0822) Pulse Rate:  [66-70] 70 (10/15 0822) Resp:  [18] 18 (10/14 2100) BP: (108-126)/(50-69) 126/69 (10/15 0822) SpO2:  [95 %-96 %] 96 % (10/15 0822) Weight:  [153.6 kg] 153.6 kg (10/14 2100) Last BM Date: 03/17/19  Intake/Output from previous day: No intake/output data recorded. Intake/Output this shift: No intake/output data recorded.  PE: Gen:  Alert, NAD, pleasant Card:  Regular rate and rhythm Pulm:  Normal effor Abd: Soft, obese, ND, wound with granulation tissue, no purulence noted, unable to visualize wound base but extends to depth of 15 cm Skin: warm and dry, no rashes  Psych: A&Ox3   Lab Results:  Recent Labs    03/18/19 0842 03/19/19 0235  WBC 4.1 3.7*  HGB 11.5* 11.3*  HCT 35.8* 36.1  PLT 221 216   BMET Recent Labs    03/18/19 0842 03/19/19 0235  NA 138 139  K 3.5 3.9  CL 104 108  CO2 23 21*  GLUCOSE 100* 134*  BUN 8 7  CREATININE 0.73 0.55  CALCIUM 8.7* 8.5*   PT/INR No results for input(s): LABPROT, INR in the last 72 hours. CMP     Component Value Date/Time   NA 139 03/19/2019 0235   NA 142 02/27/2019 1314   K 3.9 03/19/2019 0235   CL 108 03/19/2019 0235   CO2 21 (L) 03/19/2019 0235   GLUCOSE 134 (H) 03/19/2019 0235   BUN 7 03/19/2019 0235   BUN 10 02/27/2019 1314   CREATININE 0.55 03/19/2019 0235   CREATININE 0.64 08/26/2015 0900   CALCIUM 8.5 (L) 03/19/2019 0235   PROT 6.8 02/27/2019 1314   ALBUMIN 3.9 02/27/2019 1314   AST 17 02/27/2019 1314   ALT 18 02/27/2019 1314   ALKPHOS 112 02/27/2019 1314   BILITOT 0.8 02/27/2019 1314   GFRNONAA >60 03/19/2019 0235   GFRNONAA >89 08/26/2015 0900   GFRAA >60 03/19/2019 0235   GFRAA >89 08/26/2015 0900   Lipase      Component Value Date/Time   LIPASE 22 02/02/2019 1810       Studies/Results: No results found.  Anti-infectives: Anti-infectives (From admission, onward)   Start     Dose/Rate Route Frequency Ordered Stop   03/18/19 1215  fluconazole (DIFLUCAN) tablet 100 mg  Status:  Discontinued     100 mg Oral Daily 03/18/19 1202 03/18/19 1241   03/18/19 0400  vancomycin (VANCOCIN) 1,500 mg in sodium chloride 0.9 % 500 mL IVPB     1,500 mg 250 mL/hr over 120 Minutes Intravenous On call to O.R. 03/17/19 0703 03/18/19 1054       Assessment/Plan OSA - refusing CPAP, doesn't wear one at home HTN - home meds Asthma - nebs PRN, home meds Anxiety - home klonopin CHF - lasix daily S/P ex lap, SBR and primary repair of ventral hernia 02/03/2019 Open midline wound 02/18/19 COVID-19 infection - asymptomatic, precautions  Wound infection after surgery - S/P Abdominal wound exploration, placement of wound VAC 3 x 6 x 12 cm deep, Dr. Georgette Dover, 10/13 - VAC changed today, white foam in base, wound clean, I think can likely wait until Monday to change again but will confirm with MD  FEN: HH VTE: SCD's, lovenox ID: Vanc & Diflucan once  Foley: none Follow up: TBD  DISPO: Continue VAC, will need to figure out dispo planning with case management   LOS: 2 days    Brigid Re , [facility] Surgery 03/20/2019, 8:58 AM Pager: [phone] Consults: [phone] 7:00 AM - 4:30 PM M, W-F 7:00 AM - 11:30 AM Tues, Sat, Sun

## 2019-03-20 NOTE — Consult Note (Signed)
[place] Nurse wound follow up  Next NPWT (VAC) dressing change due 03/24/19.  Notified [facility] [place] that we would not provide care at the bedside due to positive Covid status.  Surgery team will be able to manage ongoing  wound care. Will not follow at this time.  Please re-consult if needed.  Domenic Moras MSN, RN, FNP-BC CWON Wound, Ostomy, Continence Nurse Pager [phone]

## 2019-03-20 NOTE — TOC Progression Note (Addendum)
Transition of Care [facility]) - Progression Note    Patient Details  Name: Kelsey Bullock MRN: [id number] Date of Birth: March 25, 1961  Transition of [facility]) CM/SW Contact  Maryclare Labrador, RN Phone Number: 03/20/2019, 10:41 AM  Clinical Narrative:  Pt transferred from [facility] [address] overnight.   Pt has a new wound vac - CM confirmed with [facility] that pt has been approved and will be delivered once discharge home is finalized.  Barrier for discharge;  [facility] informed CM yesterday evening that they will no longer be able to resume /increase HH at discharge as pt will now require increased visits secondary to wound vac [facility] previously accepted referral earlier yesterday).  Per previous CM - surgical team aware of barrier.  CM reached out to [facility] this am and asked for escalation to leadership regarding accepting pt back as it is not likely that  another agency will accept pt (barrier is medicaid and complexity of pt).  CMA with [facility] department is actively contacting [facility] agencies on the medicare.gov HH list to request referral consideration for pt  CM also escalated barrier with [facility] supervisor and requested assistance.  CM also reached out surgery to see if wound vac changes can be performed in office due to barrier with [facility] - per surgery this is not an option.  CMA exhausted medicare.gov HH list - no agency will accept pt.   Expected Discharge Plan: [place] Services Barriers to Discharge: Other (comment)([facility] backed out of resuming HH for pt - unable to find [facility] that will take pt)  Expected Discharge Plan and Services Expected Discharge Plan: [place] Choice: [place] arrangements for the past 2 months: Single Family Home                 DME Arranged: N/A         HH Arranged: RN, PT [place] Agency: [place] (now [facility]) Date [place]: 03/19/19 Time [place]: [place] Representative spoke with at [place]:  [place] ([place]) Interventions    Readmission Risk Interventions No flowsheet data found.

## 2019-03-20 NOTE — Consult Note (Signed)
[place] Nurse wound consult note Reason for Consult:Nonhealing surgical wound to midline abdomen.  NPWT (VAC) dressing change.  Will implement white foam to wound bed and top with black foam. Secure with drape. Patient premedicated for pain prior to procedure.  Wound type:infectious/surgical Pressure Injury POA:  NA Measurement: 6 cm (head to toe) 3 cm wide and 15 cm depth (tunnels back at 1 o'clock) Wound [id number] to visualize deepest part of wound Drainage (amount, consistency, odor) moderate serosanguinous  No odor.  Periwound:scarring from previous hernia repair. Dressing procedure/placement/frequency: Cleanse wound with NS.  Apply white foam to wound bed.  Cover with black foam.  Secure with drape.  Change three times weekly.  Will not follow at this time.  Please re-consult if needed.  Domenic Moras MSN, RN, FNP-BC CWON Wound, Ostomy, Continence Nurse Pager [phone]

## 2019-03-20 NOTE — Progress Notes (Signed)
Transfer form ^M, alert and well related. Patient VSS wo complaint of pain, wound vac to abdominal wound site clean and intact @125mmhg  of pressure per order-SCD in use per order, patent noted to have reddened are ininner thigh states has known cyst-non painful.patient made comfortable will continue to monitor.

## 2019-03-21 ENCOUNTER — Inpatient Hospital Stay (HOSPITAL_COMMUNITY): Payer: Medicaid Other

## 2019-03-21 LAB — CBC WITH DIFFERENTIAL/PLATELET
Abs Immature Granulocytes: 0.02 10*3/uL (ref 0.00–0.07)
Basophils Absolute: 0 10*3/uL (ref 0.0–0.1)
Basophils Relative: 0 %
Eosinophils Absolute: 0 10*3/uL (ref 0.0–0.5)
Eosinophils Relative: 0 %
HCT: 36 % (ref 36.0–46.0)
Hemoglobin: 11.1 g/dL — ABNORMAL LOW (ref 12.0–15.0)
Immature Granulocytes: 0 %
Lymphocytes Relative: 28 %
Lymphs Abs: 1.4 10*3/uL (ref 0.7–4.0)
MCH: 27.8 pg (ref 26.0–34.0)
MCHC: 30.8 g/dL (ref 30.0–36.0)
MCV: 90 fL (ref 80.0–100.0)
Monocytes Absolute: 0.5 10*3/uL (ref 0.1–1.0)
Monocytes Relative: 9 %
Neutro Abs: 3.1 10*3/uL (ref 1.7–7.7)
Neutrophils Relative %: 63 %
Platelets: 172 10*3/uL (ref 150–400)
RBC: 4 MIL/uL (ref 3.87–5.11)
RDW: 14.8 % (ref 11.5–15.5)
WBC: 5 10*3/uL (ref 4.0–10.5)
nRBC: 0 % (ref 0.0–0.2)

## 2019-03-21 LAB — D-DIMER, QUANTITATIVE: D-Dimer, Quant: 1.19 ug/mL-FEU — ABNORMAL HIGH (ref 0.00–0.50)

## 2019-03-21 NOTE — Care Management (Addendum)
[facility] liaison informed CM that branch is upholding decision to not resume services with pt.  Liaison communicated barriers secondary to pt having COVID and [facility] staff needing to also go into ALF.  CM contacted TOC supervisor BC and requested additional excalation with Midwife.  CM spoke with pt via phone and explained barrier.  CM also mentioned SNF,  pt again stated she can not release her check and therefore SNF is not an option.  CM contacted pt's sister per request.  Pts sister Collie Siad confirms that pt will discharge to her home in [place].  CM explained HH barrier to sister.  CM asked sister if she would be willing to be taught how to perform  wound vac change - sister is hesitant but informed CM that she is a retired Marine scientist and will do it "if its the only way".  CM will reach out to surgery to see if this is an option  Update:  CM spoke with [facility] from surgery.  Surgery not in agreement with sister performing vac changes as sister was not able to adequately perform wet to dry PTA.  Surgery plans to transfer to pt [facility] as pt is now showing COVID symptoms.  CM expressed that pt would still have same barriers to discharge from [facility] regarding [place].

## 2019-03-21 NOTE — Progress Notes (Signed)
[facility] Progress Note  3 Days Post-Op  Subjective: CC: pain Abdominal pain stable. Patient also with mild headache this AM.   Objective: Vital signs in last 24 hours: Temp:  [98.6 F (37 C)-100.7 F (38.2 C)] 100.7 F (38.2 C) (10/16 0839) Pulse Rate:  [71-97] 97 (10/16 0839) Resp:  [15] 15 (10/15 1624) BP: (100-120)/(54-63) 103/56 (10/16 0839) SpO2:  [93 %-97 %] 93 % (10/16 0839) Last BM Date: 03/17/19  Intake/Output from previous day: 10/15 0701 - 10/16 0700 In: -  Out: 200 [Drains:200] Intake/Output this shift: No intake/output data recorded.  PE: Gen:  Alert, NAD, pleasant, cooperative Card:  RRR, no M/G/R heard Pulm:  CTA, no W/R/R, effort normal Abd: Soft, obese, ND, +BS, wound vac of midline wound. TTP around this vac otherwise nontender abdomen Skin: no rashes noted, warm and dry  Lab Results:  Recent Labs    03/19/19 0235  WBC 3.7*  HGB 11.3*  HCT 36.1  PLT 216   BMET Recent Labs    03/19/19 0235  NA 139  K 3.9  CL 108  CO2 21*  GLUCOSE 134*  BUN 7  CREATININE 0.55  CALCIUM 8.5*   PT/INR No results for input(s): LABPROT, INR in the last 72 hours. CMP     Component Value Date/Time   NA 139 03/19/2019 0235   NA 142 02/27/2019 1314   K 3.9 03/19/2019 0235   CL 108 03/19/2019 0235   CO2 21 (L) 03/19/2019 0235   GLUCOSE 134 (H) 03/19/2019 0235   BUN 7 03/19/2019 0235   BUN 10 02/27/2019 1314   CREATININE 0.55 03/19/2019 0235   CREATININE 0.64 08/26/2015 0900   CALCIUM 8.5 (L) 03/19/2019 0235   PROT 6.8 02/27/2019 1314   ALBUMIN 3.9 02/27/2019 1314   AST 17 02/27/2019 1314   ALT 18 02/27/2019 1314   ALKPHOS 112 02/27/2019 1314   BILITOT 0.8 02/27/2019 1314   GFRNONAA >60 03/19/2019 0235   GFRNONAA >89 08/26/2015 0900   GFRAA >60 03/19/2019 0235   GFRAA >89 08/26/2015 0900   Lipase     Component Value Date/Time   LIPASE 22 02/02/2019 1810       Studies/Results: No results  found.  Anti-infectives: Anti-infectives (From admission, onward)   Start     Dose/Rate Route Frequency Ordered Stop   03/18/19 1215  fluconazole (DIFLUCAN) tablet 100 mg  Status:  Discontinued     100 mg Oral Daily 03/18/19 1202 03/18/19 1241   03/18/19 0400  vancomycin (VANCOCIN) 1,500 mg in sodium chloride 0.9 % 500 mL IVPB     1,500 mg 250 mL/hr over 120 Minutes Intravenous On call to O.R. 03/17/19 0703 03/18/19 1054       Assessment/Plan OSA - refusing CPAP, doesn't wear one at home HTN - home meds Asthma - nebs PRN, home meds Anxiety - home klonopin CHF - lasix daily S/P ex lap, SBR and primary repair of ventral hernia 02/03/2019 Open midline wound 02/18/19 COVID-19 infection - low grade fever this AM, precautions  Wound infection after surgery - S/PAbdominal wound exploration, placement of wound VAC 3 x 6 x 12 cm deep, Dr. Georgette Dover, 10/13 - VAC changed 10/15, white foam in wound base, next change Monday 10/19  [place] VTE: SCD's, lovenox [id number] & Diflucan once Foley:none Follow up:TBD  DISPO:Continue VAC, possible transfer to [facility] later today.    LOS: 3 days    Brigid Re , [facility] Surgery 03/21/2019, 9:54 AM Please see Amion for  pager number during day hours 7:00am-4:30pm

## 2019-03-21 NOTE — Progress Notes (Signed)
I spoke with Dr. Bonner Puna that is willing to accept pt at [facility]. We will follow and do vac changes if WOC is unable to go to [place]. We appreciate medicine's assistance.   Jackson Latino, [facility] Surgery Pager [phone]

## 2019-03-22 ENCOUNTER — Encounter (HOSPITAL_COMMUNITY): Payer: Self-pay | Admitting: Family Medicine

## 2019-03-22 DIAGNOSIS — S31109D Unspecified open wound of abdominal wall, unspecified quadrant without penetration into peritoneal cavity, subsequent encounter: Secondary | ICD-10-CM

## 2019-03-22 DIAGNOSIS — K5900 Constipation, unspecified: Secondary | ICD-10-CM | POA: Diagnosis not present

## 2019-03-22 DIAGNOSIS — U071 COVID-19: Secondary | ICD-10-CM | POA: Diagnosis not present

## 2019-03-22 DIAGNOSIS — L089 Local infection of the skin and subcutaneous tissue, unspecified: Secondary | ICD-10-CM

## 2019-03-22 LAB — COMPREHENSIVE METABOLIC PANEL
ALT: 18 U/L (ref 0–44)
AST: 17 U/L (ref 15–41)
Albumin: 3 g/dL — ABNORMAL LOW (ref 3.5–5.0)
Alkaline Phosphatase: 54 U/L (ref 38–126)
Anion gap: 9 (ref 5–15)
BUN: 10 mg/dL (ref 6–20)
CO2: 26 mmol/L (ref 22–32)
Calcium: 8.3 mg/dL — ABNORMAL LOW (ref 8.9–10.3)
Chloride: 99 mmol/L (ref 98–111)
Creatinine, Ser: 0.61 mg/dL (ref 0.44–1.00)
GFR calc Af Amer: >60 mL/min (ref >60)
GFR calc non Af Amer: >60 mL/min (ref >60)
Glucose, Bld: 108 mg/dL — ABNORMAL HIGH (ref 70–99)
Potassium: 4.5 mmol/L (ref 3.5–5.1)
Sodium: 134 mmol/L — ABNORMAL LOW (ref 135–145)
Total Bilirubin: 0.6 mg/dL (ref 0.3–1.2)
Total Protein: 6.2 g/dL — ABNORMAL LOW (ref 6.5–8.1)

## 2019-03-22 LAB — C-REACTIVE PROTEIN: CRP: 5.2 mg/dL — ABNORMAL HIGH (ref <1.0)

## 2019-03-22 LAB — GLUCOSE, CAPILLARY
Glucose-Capillary: 110 mg/dL — ABNORMAL HIGH (ref 70–99)
Glucose-Capillary: 109 mg/dL — ABNORMAL HIGH (ref 70–99)

## 2019-03-22 LAB — FERRITIN: Ferritin: 381 ng/mL — ABNORMAL HIGH (ref 11–307)

## 2019-03-22 MED ORDER — INSULIN ASPART 100 UNIT/ML ~~LOC~~ SOLN: 0.0000 [IU] | Freq: Every day | SUBCUTANEOUS | Status: DC

## 2019-03-22 MED ORDER — FLUTICASONE PROPIONATE 50 MCG/ACT NA SUSP
2.0000 | Freq: Every day | NASAL | Status: DC
  Administered 2019-03-22 – 2019-04-04 (×14): 2 via NASAL
  Filled 2019-03-22: qty 16

## 2019-03-22 MED ORDER — BISACODYL 10 MG RE SUPP: 10.0000 mg | Freq: Every day | RECTAL | Status: DC | PRN

## 2019-03-22 MED ORDER — ZINC SULFATE 220 (50 ZN) MG PO CAPS
220.0000 mg | ORAL_CAPSULE | Freq: Every day | ORAL | Status: DC
  Administered 2019-03-22 – 2019-04-04 (×14): 220 mg via ORAL
  Filled 2019-03-22 (×14): qty 1

## 2019-03-22 MED ORDER — ENOXAPARIN SODIUM 80 MG/0.8ML ~~LOC~~ SOLN
0.5000 mg/kg | SUBCUTANEOUS | Status: DC
  Administered 2019-03-23 – 2019-04-04 (×13): 80 mg via SUBCUTANEOUS
  Filled 2019-03-22 (×13): qty 0.8

## 2019-03-22 MED ORDER — SENNA 8.6 MG PO TABS
2.0000 | ORAL_TABLET | Freq: Every day | ORAL | Status: DC
  Administered 2019-03-22 – 2019-03-26 (×5): 17.2 mg via ORAL
  Filled 2019-03-22 (×5): qty 2

## 2019-03-22 MED ORDER — OXYCODONE HCL 5 MG PO TABS
15.0000 mg | ORAL_TABLET | ORAL | Status: DC | PRN
  Administered 2019-03-22 – 2019-04-02 (×5): 15 mg via ORAL
  Filled 2019-03-22 (×6): qty 3

## 2019-03-22 MED ORDER — POLYETHYLENE GLYCOL 3350 17 G PO PACK
17.0000 g | PACK | Freq: Every day | ORAL | Status: DC
  Administered 2019-03-22 – 2019-03-26 (×5): 17 g via ORAL
  Filled 2019-03-22 (×5): qty 1

## 2019-03-22 MED ORDER — VITAMIN C 500 MG PO TABS
1000.0000 mg | ORAL_TABLET | Freq: Every day | ORAL | Status: DC
  Administered 2019-03-22 – 2019-04-04 (×14): 1000 mg via ORAL
  Filled 2019-03-22 (×14): qty 2

## 2019-03-22 MED ORDER — INSULIN ASPART 100 UNIT/ML ~~LOC~~ SOLN
0.0000 [IU] | Freq: Three times a day (TID) | SUBCUTANEOUS | Status: DC
  Administered 2019-03-23: 1 [IU] via SUBCUTANEOUS
  Administered 2019-03-23: 2 [IU] via SUBCUTANEOUS

## 2019-03-22 NOTE — Progress Notes (Signed)
Updating and speaking with Charge Nurse and [facility] there seems to be no wound nurse on call available this weekend within [facility] so I am unable to consult wound care nursing at present time.   Calling wound vac product help line [phone] and speaking with technical support, VAC ULTA therapy system support feels nursing has done exhausted all troubleshooting issues, with exception of changing out the dressing itself.  Technical support reported it is likely that at wound dsg and tubing junction there may be coagulated material that is partially blocking negative air therapy impeding flow.   At present negative air pressure reading 38mmHg on wound vac with intermittent "blockage alarms" continuing.  Per previous conversation with General surgeon, Dr. Lucia Gaskins, as long as partial negative air pressure remains and drainage continues, continue to reset alarm, troubleshoot, and await dressing change from surgery team on Monday.

## 2019-03-22 NOTE — Progress Notes (Addendum)
PROGRESS NOTE  Kelsey Bullock  [id number] DOB: 11-Aug-1960 [name]: 03/18/2019 PCP: [name], MD   Brief Narrative: Kelsey Bullock is a 58 y.o. female with a history of morbid obesity who was admitted following surgery for a poorly healing infraumbilical surgical wound. She had initially had an ex lap with small bowel resection for incarcerated ventral hernia on 02/02/2019. Slow wound healing, though discharged with drain removed on 9/8, having her sister perform wet-to-dry dressing changes. Due to continued pain at the site and failure of the wound to close, CT was performed 10/9 and showed 6x4x4cm collection of gas and debris in the subcutaneous fat at the site with deeper collection felt to be an area of infection at the level of the fascia, possibly tracking behind the rectus sheath. She was scheduled for surgery. General surgery, Dr. Georgette Dover, performed wound exploration with wound vac placement (3 x 6 x 12cm deep) on 10/13. Small gaps in the fascial closure were noted and necrotic fascia was sharply debrided prior to vac placement. Though pre-surgical covid testing was negative, this was repeated when her sister was diagnosed with covid-19. SARS-CoV-2 PCR collected 10/14 was positive on 10/16, albeit with no CXR infiltrates or respiratory symptoms. This made discharge with wound vac impossible as home health was reportedly unable to accommodate covid-positive patients and the patient was expected to require at least another week of vac care, so she was transferred to [facility] 10/16.   Assessment & Plan: Principal Problem:   Chronic abdominal wound infection Active Problems:   HYPERCHOLESTEROLEMIA   Morbid obesity ([place])   Anxiety state   HYPERTENSION, BENIGN SYSTEMIC   Asthma   Controlled type 2 diabetes mellitus with neuropathy (HCC)   GERD (gastroesophageal reflux disease)   Diastolic CHF with preserved left ventricular function, NYHA class 2 ([place])   COVID-19 virus infection  Constipation  Chronic abdominal wound infection: Initially had ex lap with small bowel resection for incarcerated ventral hernia on 02/02/2019, now s/p wound exploration with wound vac placement (3 x 6 x 12cm deep) 10/13 by Dr. Georgette Dover.  - Will require ongoing wound vac changes/care prior to discharge. General surgery to follow and, along with [facility] team, care for wound.  - Will augment oxycodone dosing, otherwise continue analgesia per surgery's orders  Covid-19 infection:  - Trend inflammatory markers. CRP 5.2, ferritin 381, d-dimer 1.19.  - Since she's asymptomatic, will not initiate therapy. If develops hypoxia, respiratory distress, and/or CXR infiltrates, would start remdesivir 200mg  IV x1, 100mg  IV q24h x4 doses thereafter (ALT wnl). Would strongly prefer to avoid steroids given problems with wound healing. - Avoid NSAIDs if able - Incentive spirometry encouraged, OOB as able - Continue airborne-contact precautions per protocol.  - Due to covid-related hypercoagulability, will administer weight-based lovenox prophylaxis (0.5mg /kg q24h) - Vitamin C and zinc may be helpful, ordered.  T2DM: Last HbA1c shows this is well controlled without medications.  - Monitor glucose and give SSI if needed to keep CBG 180mg /dl.  Constipation: Has not had BM since surgery, +flatus. Has hx post-op ileus.  - On colace. Will add senna, miralax, dulcolax prn  Obesity: BMI 59.  - Weight loss recommended, though not at the expense of protein.   Chronic HFpEF: Clinical volume status is difficult, but roughly appears euvolemic and able to take po. Enlarged cardiac silhouette noted on CXR 10/16 on AP projection.  - DC IVF - Continue daily lasix, lisinopril - Follow I/O   DUB: Chronic.  - Continue norethindrone qHS.  Hyperlipidemia:  - Continue statin  Anxiety:  - Continue home clonazepam  Asthma: No wheezing.  - Continue singulair, dulera, prn albuterol. Also on omalizumab per MAR.  - Avoid nebulized  therapy while at high risk of transmission  GERD:  - Continue pepcid  DVT prophylaxis: Lovenox 80mg   q24h Code Status: Full Family Communication: None at bedside Disposition Plan: Home once wound vac able to be discontinued  Consultants:  General surgery  Procedures: Abdominal wound exploration, placement of wound VAC 3 x 6 x 12 cm deep Surgeon: Maia Petties  Antimicrobials:  Vancomycin x1 10/13   Subjective: Abdominal pain at site of surgery is severe, constant, improved incompletely by morphine, oxycodone, and tramadol. No shortness of breath or chest pain. +flatus, no BM since surgery. Denies abdominal pain other than surgical site.  Objective: Vitals:   03/22/19 0840 03/22/19 0950 03/22/19 1122 03/22/19 1545  BP: (!) 123/55  (!) 100/48 (!) 92/47  Pulse: 81 78 78 81  Resp: 18 (!) 23 15 20   Temp: 99.5 F (37.5 C)  98.7 F (37.1 C) 98.3 F (36.8 C)  TempSrc: Axillary  Oral Oral  SpO2: 94% 93% 90% 93%  Weight:        Intake/Output Summary (Last 24 hours) at 03/22/2019 1656 Last data filed at 03/22/2019 1600 Gross per 24 hour  Intake 1683.52 ml  Output 2500 ml  Net -816.48 ml   Filed Weights   03/19/19 2100 03/21/19 2206  Weight: (!) 153.6 kg (!) 156.3 kg    Gen: 58 y.o. female in no distress Pulm: Non-labored breathing room air. Distant, but clear to auscultation bilaterally.  CV: Regular rate and rhythm. No murmur, rub, or gallop. Unable to determine JVP, no pitting pedal edema. GI: Abdomen soft, tender only around wound without surrounding erythema, non-distended, with present bowel sounds. No organomegaly or masses felt. Ext: Warm, no deformities Skin: Wound vac in place over infraumbilical longitudinally oriented wound with pink wound edges visible, serous drainage noted.  Neuro: Alert and oriented. No focal neurological deficits. Psych: Judgement and insight appear normal. Mood & affect appropriate.   Data Reviewed: I have personally reviewed  following labs and imaging studies  CBC: Recent Labs  Lab 03/18/19 0842 03/19/19 0235 03/21/19 2303  WBC 4.1 3.7* 5.0  NEUTROABS  --   --  3.1  HGB 11.5* 11.3* 11.1*  HCT 35.8* 36.1 36.0  MCV 89.3 89.4 90.0  PLT 221 216 [id number]   Basic Metabolic Panel: Recent Labs  Lab 03/18/19 0842 03/19/19 0235 03/21/19 2303  NA 138 139 134*  K 3.5 3.9 4.5  CL 104 108 99  CO2 23 21* 26  GLUCOSE 100* 134* 108*  BUN 8 7 10   CREATININE 0.73 0.55 0.61  CALCIUM 8.7* 8.5* 8.3*   GFR: Estimated Creatinine Clearance: 116.7 mL/min (by C-G formula based on SCr of 0.61 mg/dL). Liver Function Tests: Recent Labs  Lab 03/21/19 2303  AST 17  ALT 18  ALKPHOS 54  BILITOT 0.6  PROT 6.2*  ALBUMIN 3.0*   No results for input(s): LIPASE, AMYLASE in the last 168 hours. No results for input(s): AMMONIA in the last 168 hours. Coagulation Profile: No results for input(s): INR, PROTIME in the last 168 hours. Cardiac Enzymes: No results for input(s): CKTOTAL, CKMB, CKMBINDEX, TROPONINI in the last 168 hours. BNP (last 3 results) No results for input(s): PROBNP in the last 8760 hours. HbA1C: No results for input(s): HGBA1C in the last 72 hours. CBG: No results for input(s):  GLUCAP in the last 168 hours. Lipid Profile: No results for input(s): CHOL, HDL, LDLCALC, TRIG, CHOLHDL, LDLDIRECT in the last 72 hours. Thyroid Function Tests: No results for input(s): TSH, T4TOTAL, FREET4, T3FREE, THYROIDAB in the last 72 hours. Anemia Panel: Recent Labs    03/21/19 2303  FERRITIN 381*   Urine analysis:    Component Value Date/Time   COLORURINE AMBER (A) 02/06/2019 1550   APPEARANCEUR HAZY (A) 02/06/2019 1550   LABSPEC 1.025 02/06/2019 1550   PHURINE 7.0 02/06/2019 1550   GLUCOSEU NEGATIVE 02/06/2019 1550   HGBUR NEGATIVE 02/06/2019 1550   HGBUR trace-intact 10/12/2008 1058   BILIRUBINUR SMALL (A) 02/06/2019 1550   BILIRUBINUR negative 09/21/2016 0911   KETONESUR 20 (A) 02/06/2019 1550   PROTEINUR  30 (A) 02/06/2019 1550   UROBILINOGEN 0.2 09/21/2016 0911   UROBILINOGEN 0.2 10/12/2008 1058   NITRITE POSITIVE (A) 02/06/2019 1550   LEUKOCYTESUR NEGATIVE 02/06/2019 1550   Recent Results (from the past 240 hour(s))  Novel Coronavirus, NAA (Hosp order, Send-out to Ref Lab; TAT 18-24 hrs     Status: None   Collection Time: 03/14/19  1:15 PM   Specimen: Nasopharyngeal Swab; Respiratory  Result Value Ref Range Status   SARS-CoV-2, NAA NOT DETECTED NOT DETECTED Final    Comment: (NOTE) This nucleic acid amplification test was developed and its performance characteristics determined by [employer]. Nucleic acid amplification tests include PCR and TMA. This test has not been FDA cleared or approved. This test has been authorized by FDA under an Emergency Use Authorization (EUA). This test is only authorized for the duration of time the declaration that circumstances exist justifying the authorization of the emergency use of in vitro diagnostic tests for detection of SARS-CoV-2 virus and/or diagnosis of COVID-19 infection under section 564(b)(1) of the Act, 21 U.S.C. [id number]) (1), unless the authorization is terminated or revoked sooner. When diagnostic testing is negative, the possibility of a false negative result should be considered in the context of a patient's recent exposures and the presence of clinical signs and symptoms consistent with COVID-19. An individual without symptoms of COVID- 19 and who is not shedding SARS-CoV-2 vi rus would expect to have a negative (not detected) result in this assay. Performed At: [facility] [address] [place], [place] [id number] Rush Farmer MD [id number]    [place]  Final    Comment: Performed at [facility], [place] [address] [place], [place] [address]  SARS Coronavirus 2 by RT PCR (hospital order, performed in [facility] hospital lab) Nasopharyngeal Nasopharyngeal Swab      Status: Abnormal   Collection Time: 03/19/19 12:21 PM   Specimen: Nasopharyngeal Swab  Result Value Ref Range Status   SARS Coronavirus 2 POSITIVE (A) NEGATIVE Final    Comment: RESULT CALLED TO, READ BACK BY AND VERIFIED WITH: RN T HARVEY [id number] AT [id number] BY CM (NOTE) If result is NEGATIVE SARS-CoV-2 target nucleic acids are NOT DETECTED. The SARS-CoV-2 RNA is generally detectable in upper and lower  respiratory specimens during the acute phase of infection. The lowest  concentration of SARS-CoV-2 viral copies this assay can detect is 250  copies / mL. A negative result does not preclude SARS-CoV-2 infection  and should not be used as the sole basis for treatment or other  patient management decisions.  A negative result may occur with  improper specimen collection / handling, submission of specimen other  than nasopharyngeal swab, presence of viral mutation(s) within the  areas targeted  by this assay, and inadequate number of viral copies  (<250 copies / mL). A negative result must be combined with clinical  observations, patient history, and epidemiological information. If result is POSITIVE SARS-CoV-2 target nucleic acids are DETECTED. The  SARS-CoV-2 RNA is generally detectable in upper and lower  respiratory specimens during the acute phase of infection.  Positive  results are indicative of active infection with SARS-CoV-2.  Clinical  correlation with patient history and other diagnostic information is  necessary to determine patient infection status.  Positive results do  not rule out bacterial infection or co-infection with other viruses. If result is PRESUMPTIVE POSTIVE SARS-CoV-2 nucleic acids MAY BE PRESENT.   A presumptive positive result was obtained on the submitted specimen  and confirmed on repeat testing.  While 2019 novel coronavirus  (SARS-CoV-2) nucleic acids may be present in the submitted sample  additional confirmatory testing may be necessary for  epidemiological  and / or clinical management purposes  to differentiate between  SARS-CoV-2 and other Sarbecovirus currently known to infect humans.  If clinically indicated additional testing with an alternate test  methodology [phone]) is ad vised. The SARS-CoV-2 RNA is generally  detectable in upper and lower respiratory specimens during the acute  phase of infection. The expected result is Negative. Fact Sheet for Patients:  StrictlyIdeas.no Fact Sheet for Healthcare Providers: BankingDealers.co.za This test is not yet approved or cleared by the [place] FDA and has been authorized for detection and/or diagnosis of SARS-CoV-2 by FDA under an Emergency Use Authorization (EUA).  This EUA will remain in effect (meaning this test can be used) for the duration of the COVID-19 declaration under Section 564(b)(1) of the Act, 21 U.S.C. section 360bbb-3(b)(1), unless the authorization is terminated or revoked sooner. Performed at [facility], [place] [address] [place], [place] [address]       Radiology Studies: Dg Chest Port 1 View  Result Date: 03/21/2019 CLINICAL DATA:  Febrile EXAM: PORTABLE CHEST 1 VIEW COMPARISON:  02/06/2019 FINDINGS: Cardiomegaly. Both lungs are clear. The visualized skeletal structures are unremarkable. IMPRESSION: Cardiomegaly without acute abnormality of the lungs in AP portable projection. Electronically Signed   By: Eddie Candle M.D.   On: 03/21/2019 14:51    Scheduled Meds: . atorvastatin  40 mg Oral QHS  . budesonide  1 puff Inhalation BID  . docusate sodium  100 mg Oral BID  . [START ON 03/23/2019] enoxaparin (LOVENOX) injection  0.5 mg/kg Subcutaneous Q24H  . erythromycin  1 application Both Eyes QHS  . famotidine  20 mg Oral BID  . feeding supplement (ENSURE ENLIVE)  237 mL Oral TID BM  . fluticasone  2 spray Each Nare Daily  . furosemide  20 mg Oral Daily  . insulin aspart  0-5 Units  Subcutaneous QHS  . insulin aspart  0-9 Units Subcutaneous TID WC  . lisinopril  40 mg Oral Daily  . mometasone-formoterol  2 puff Inhalation BID  . montelukast  10 mg Oral QHS  . multivitamin with minerals  1 tablet Oral Daily  . norethindrone  2.5 mg Oral QHS  . olopatadine  1 drop Both Eyes QHS  . polyethylene glycol  17 g Oral Daily  . senna  2 tablet Oral Daily  . vitamin C  1,000 mg Oral Daily  . zinc sulfate  220 mg Oral Daily   Continuous Infusions:   LOS: 4 days   Time spent: 25 minutes.  Patrecia Pour, MD [facility] [email or web address] Password  [email or web address] 03/22/2019, 4:56 PM

## 2019-03-22 NOTE — Progress Notes (Signed)
Notified Dr. Bonner Puna and on call general surgeon, Dr. Alphonsa Overall, regarding wound vac "blockage alarm" ...  "potential negative air pressure blockage detected."  Primary and multiple fellow nurses have attempted to troubleshoot error alarms without success.  Primary RN has ensured no leak from wound vac, that all tubing clamps are open, tubing is not kinked, or blocked, and that wound vac cannister is below wound.  Nursing has even changed out wound vac machine, repeating all steps above.  Cannister was changed overnight per report.  Despite efforts, wound vac continues with "blockage alarm" this afternoon and fluctuates between 25-50-113mmHg of negative suction.  Interestingly, drainage appears to be moving forward despite alarms.  At present wound vac canister measures 84ml serosanguineous drainage.   Dr. Lucia Gaskins on-call recommended contacting [facility] nursing and possibly [employer] manufacturing help line.  MD thanked nursing for all troubleshooting efforts and would like to keep wound vac in place, with plan for patient's surgery team to change wound vac dressing on Monday 03/24/2019.  Since wound vac dsg appears intact, no leaks, but with fluctuating pressures, MD feels appropriate to leave in place, avoiding wet-to-dry dressing due to size and complexity of tracking abdominal wound.

## 2019-03-23 ENCOUNTER — Inpatient Hospital Stay (HOSPITAL_COMMUNITY): Payer: Medicaid Other

## 2019-03-23 DIAGNOSIS — F411 Generalized anxiety disorder: Secondary | ICD-10-CM

## 2019-03-23 DIAGNOSIS — E7800 Pure hypercholesterolemia, unspecified: Secondary | ICD-10-CM

## 2019-03-23 DIAGNOSIS — U071 COVID-19: Secondary | ICD-10-CM

## 2019-03-23 DIAGNOSIS — I10 Essential (primary) hypertension: Secondary | ICD-10-CM

## 2019-03-23 DIAGNOSIS — E1140 Type 2 diabetes mellitus with diabetic neuropathy, unspecified: Secondary | ICD-10-CM

## 2019-03-23 DIAGNOSIS — K219 Gastro-esophageal reflux disease without esophagitis: Secondary | ICD-10-CM

## 2019-03-23 DIAGNOSIS — I5030 Unspecified diastolic (congestive) heart failure: Secondary | ICD-10-CM

## 2019-03-23 DIAGNOSIS — K5900 Constipation, unspecified: Secondary | ICD-10-CM

## 2019-03-23 LAB — CBC WITH DIFFERENTIAL/PLATELET
Abs Immature Granulocytes: 0.02 10*3/uL (ref 0.00–0.07)
Basophils Absolute: 0 10*3/uL (ref 0.0–0.1)
Basophils Relative: 0 %
Eosinophils Absolute: 0 10*3/uL (ref 0.0–0.5)
Eosinophils Relative: 0 %
HCT: 35.9 % — ABNORMAL LOW (ref 36.0–46.0)
Hemoglobin: 11.2 g/dL — ABNORMAL LOW (ref 12.0–15.0)
Immature Granulocytes: 0 %
Lymphocytes Relative: 35 %
Lymphs Abs: 1.6 10*3/uL (ref 0.7–4.0)
MCH: 28.1 pg (ref 26.0–34.0)
MCHC: 31.2 g/dL (ref 30.0–36.0)
MCV: 90 fL (ref 80.0–100.0)
Monocytes Absolute: 0.3 10*3/uL (ref 0.1–1.0)
Monocytes Relative: 7 %
Neutro Abs: 2.5 10*3/uL (ref 1.7–7.7)
Neutrophils Relative %: 58 %
Platelets: 159 10*3/uL (ref 150–400)
RBC: 3.99 MIL/uL (ref 3.87–5.11)
RDW: 14.8 % (ref 11.5–15.5)
WBC: 4.5 10*3/uL (ref 4.0–10.5)
nRBC: 0 % (ref 0.0–0.2)

## 2019-03-23 LAB — COMPREHENSIVE METABOLIC PANEL
ALT: 18 U/L (ref 0–44)
AST: 18 U/L (ref 15–41)
Albumin: 3.1 g/dL — ABNORMAL LOW (ref 3.5–5.0)
Alkaline Phosphatase: 54 U/L (ref 38–126)
Anion gap: 11 (ref 5–15)
BUN: 12 mg/dL (ref 6–20)
CO2: 27 mmol/L (ref 22–32)
Calcium: 8.5 mg/dL — ABNORMAL LOW (ref 8.9–10.3)
Chloride: 96 mmol/L — ABNORMAL LOW (ref 98–111)
Creatinine, Ser: 0.64 mg/dL (ref 0.44–1.00)
GFR calc Af Amer: >60 mL/min (ref >60)
GFR calc non Af Amer: >60 mL/min (ref >60)
Glucose, Bld: 124 mg/dL — ABNORMAL HIGH (ref 70–99)
Potassium: 4.3 mmol/L (ref 3.5–5.1)
Sodium: 134 mmol/L — ABNORMAL LOW (ref 135–145)
Total Bilirubin: 0.7 mg/dL (ref 0.3–1.2)
Total Protein: 6.5 g/dL (ref 6.5–8.1)

## 2019-03-23 LAB — GLUCOSE, CAPILLARY
Glucose-Capillary: 113 mg/dL — ABNORMAL HIGH (ref 70–99)
Glucose-Capillary: 129 mg/dL — ABNORMAL HIGH (ref 70–99)
Glucose-Capillary: 156 mg/dL — ABNORMAL HIGH (ref 70–99)
Glucose-Capillary: 128 mg/dL — ABNORMAL HIGH (ref 70–99)

## 2019-03-23 LAB — D-DIMER, QUANTITATIVE: D-Dimer, Quant: 0.99 ug/mL-FEU — ABNORMAL HIGH (ref 0.00–0.50)

## 2019-03-23 LAB — FERRITIN: Ferritin: 405 ng/mL — ABNORMAL HIGH (ref 11–307)

## 2019-03-23 LAB — MAGNESIUM: Magnesium: 2 mg/dL (ref 1.7–2.4)

## 2019-03-23 LAB — C-REACTIVE PROTEIN: CRP: 5.7 mg/dL — ABNORMAL HIGH (ref <1.0)

## 2019-03-23 LAB — PHOSPHORUS: Phosphorus: 4.8 mg/dL — ABNORMAL HIGH (ref 2.5–4.6)

## 2019-03-23 MED ORDER — SODIUM CHLORIDE 0.9 % IV SOLN
100.0000 mg | INTRAVENOUS | Status: AC
  Administered 2019-03-24 – 2019-03-27 (×4): 100 mg via INTRAVENOUS
  Filled 2019-03-23 (×4): qty 20

## 2019-03-23 MED ORDER — SODIUM CHLORIDE 0.9 % IV SOLN
200.0000 mg | Freq: Once | INTRAVENOUS | Status: AC
  Administered 2019-03-23: 200 mg via INTRAVENOUS
  Filled 2019-03-23: qty 40

## 2019-03-23 MED ORDER — BISACODYL 10 MG RE SUPP
10.0000 mg | Freq: Once | RECTAL | Status: AC
  Administered 2019-03-23: 10 mg via RECTAL
  Filled 2019-03-23: qty 1

## 2019-03-23 NOTE — Plan of Care (Signed)
  Problem: Education: Goal: Knowledge of General Education information will improve Description: Including pain rating scale, medication(s)/side effects and non-pharmacologic comfort measures Outcome: Progressing   Problem: Health Behavior/Discharge Planning: Goal: Ability to manage health-related needs will improve Outcome: Progressing   Problem: Clinical Measurements: Goal: Ability to maintain clinical measurements within normal limits will improve Outcome: Progressing Goal: Will remain free from infection Outcome: Progressing Goal: Diagnostic test results will improve Outcome: Progressing Goal: Respiratory complications will improve Outcome: Progressing Goal: Cardiovascular complication will be avoided Outcome: Progressing   Problem: Activity: Goal: Risk for activity intolerance will decrease Outcome: Progressing   Problem: Nutrition: Goal: Adequate nutrition will be maintained Outcome: Progressing   Problem: Coping: Goal: Level of anxiety will decrease Outcome: Progressing   Problem: Elimination: Goal: Will not experience complications related to bowel motility Outcome: Progressing Goal: Will not experience complications related to urinary retention Outcome: Progressing   Problem: Pain Managment: Goal: General experience of comfort will improve Outcome: Progressing   Problem: Safety: Goal: Ability to remain free from injury will improve Outcome: Progressing   Problem: Skin Integrity: Goal: Risk for impaired skin integrity will decrease Outcome: Progressing   Problem: Coping: Goal: Psychosocial and spiritual needs will be supported Outcome: Progressing   Problem: Education: Goal: Knowledge of risk factors and measures for prevention of condition will improve Outcome: Progressing   Problem: Skin Integrity: Goal: Risk for impaired skin integrity will decrease Outcome: Progressing   Problem: Respiratory: Goal: Will maintain a patent airway Outcome:  Progressing Goal: Complications related to the disease process, condition or treatment will be avoided or minimized Outcome: Progressing

## 2019-03-23 NOTE — Progress Notes (Signed)
Educated pt on proper positioning with a pillow. Room air. Wound vac continues to alert.

## 2019-03-23 NOTE — Progress Notes (Signed)
PROGRESS NOTE  [name]  [id number] DOB: 08-06-1960 [name]: 03/18/2019 PCP: Kelsey Resides, MD   Brief Narrative: Kelsey Bullock is a 58 y.o. female with a history of morbid obesity who was admitted following surgery for a poorly healing infraumbilical surgical wound. She had initially had an ex lap with small bowel resection for incarcerated ventral hernia on 02/02/2019. Slow wound healing, though discharged with drain removed on 9/8, having her sister perform wet-to-dry dressing changes. Due to continued pain at the site and failure of the wound to close, CT was performed 10/9 and showed 6x4x4cm collection of gas and debris in the subcutaneous fat at the site with deeper collection felt to be an area of infection at the level of the fascia, possibly tracking behind the rectus sheath. She was scheduled for surgery. General surgery, Dr. Georgette Dover, performed wound exploration with wound vac placement (3 x 6 x 12cm deep) on 10/13. Small gaps in the fascial closure were noted and necrotic fascia was sharply debrided prior to vac placement. Though pre-surgical covid testing was negative, this was repeated when her sister was diagnosed with covid-19. SARS-CoV-2 PCR collected 10/14 was positive on 10/16, albeit with no CXR infiltrates or respiratory symptoms. This made discharge with wound vac impossible as home health was reportedly unable to accommodate covid-positive patients and the patient was expected to require at least another week of vac care, so she was transferred to [facility] 10/16.   Assessment & Plan: Principal Problem:   Chronic abdominal wound infection Active Problems:   HYPERCHOLESTEROLEMIA   Morbid obesity ([place])   Anxiety state   HYPERTENSION, BENIGN SYSTEMIC   Asthma   Controlled type 2 diabetes mellitus with neuropathy (HCC)   GERD (gastroesophageal reflux disease)   Diastolic CHF with preserved left ventricular function, NYHA class 2 ([place])   COVID-19 virus infection  Constipation  Chronic abdominal wound infection: Initially had ex lap with small bowel resection for incarcerated ventral hernia on 02/02/2019, now s/p wound exploration with wound vac placement (3 x 6 x 12cm deep) 10/13 by Dr. Georgette Dover.  - Will require ongoing wound vac changes/care prior to discharge. General surgery to follow and, along with [place] team, care for wound.  - Continue analgesia per surgery's orders  Covid-19 infection: Inflammatory marker trended upward, placed on oxygen and hypoxic this AM.  - Start remdesivir 10/18 - 10/22 - Recheck CXR. - Will attempt to avoid steroids given problems with wound healing. - Avoid NSAIDs if able - Incentive spirometry encouraged, OOB as able - Continue airborne-contact precautions per protocol.  - Due to covid-related hypercoagulability, will administer weight-based lovenox prophylaxis (0.5mg /kg q24h) - Vitamin C and zinc may be helpful, ordered.  T2DM: Last HbA1c shows this is well controlled without medications.  - Monitor glucose and give SSI if needed to keep CBG 180mg /dl.  Constipation: Has hx post-op ileus. Abd benign, +flatus. - Continue senna, colace, miralax, and give suppository. LBM is prior to surgery (>5 days ago).    Obesity: BMI 59.  - Weight loss recommended, though not at the expense of protein.   Chronic HFpEF: Clinical volume status is difficult, but roughly appears euvolemic and able to take po. Enlarged cardiac silhouette noted on CXR 10/16 on AP projection.  - Continue daily lasix, lisinopril. Metabolic panel stable. - Follow I/O   DUB: Chronic.  - Continue norethindrone qHS.  Hyperlipidemia:  - Continue statin  Anxiety:  - Continue home clonazepam  Asthma: No wheezing.  - Continue singulair, dulera, prn  albuterol. Also on omalizumab per MAR.  - Avoid nebulized therapy while at high risk of transmission  GERD:  - Continue pepcid  DVT prophylaxis: Lovenox 80mg  [place] q24h Code Status: Full Family  Communication: None at bedside Disposition Plan: Home once wound vac able to be discontinued and remdesivir completed.  Consultants:  General surgery  Procedures: Abdominal wound exploration, placement of wound VAC 3 x 6 x 12 cm deep Surgeon: Imogene Burn Tsuei  Antimicrobials:  Vancomycin x1 10/13   Remdesivir 10/18 - 10/22  Subjective: Feels somewhat short of breath, no chest pain, abdominal pain improved. Still no BM, +flatus. +cough.  Objective: Vitals:   03/23/19 0000 03/23/19 0326 03/23/19 0400 03/23/19 0727  BP: (!) 99/54 (!) 108/49 (!) 106/59 92/66  Pulse: 71 75 72 84  Resp: 16 16 18 19   Temp: 98.4 F (36.9 C) 99.1 F (37.3 C) 98.3 F (36.8 C) 98.7 F (37.1 C)  TempSrc: Oral Oral Oral Oral  SpO2: 91% 99% 99% 98%  Weight:        Intake/Output Summary (Last 24 hours) at 03/23/2019 [id number] Last data filed at 03/23/2019 0000 Gross per 24 hour  Intake 843.57 ml  Output 1850 ml  Net -1006.43 ml   Filed Weights   03/19/19 2100 03/21/19 2206  Weight: (!) 153.6 kg (!) 156.3 kg   Gen: Pleasant, obese female in no distress Pulm: Nonlabored but tachypneic. Distant without definite crackles. With good waveform, SpO2 ranging 84-95% with supplemental oxygen. CV: Regular rate and rhythm. No murmur, rub, or gallop. Unable to determine JVP, no dependent edema. GI: Abdomen soft, modestly tender around incision site, non-distended, with present bowel sounds.  Ext: Warm, no deformities Skin: No new rashes, lesions or ulcers on visualized skin. Infraumbilical surgical site w/wound vac dressing c/d/i. Neuro: Alert and oriented. No focal neurological deficits. Psych: Judgement and insight appear fair. Mood euthymic & affect congruent. Behavior is appropriate.    Data Reviewed: I have personally reviewed following labs and imaging studies  CBC: Recent Labs  Lab 03/18/19 0842 03/19/19 0235 03/21/19 2303 03/23/19 0021  WBC 4.1 3.7* 5.0 4.5  NEUTROABS  --   --  3.1 2.5  HGB  11.5* 11.3* 11.1* 11.2*  HCT 35.8* 36.1 36.0 35.9*  MCV 89.3 89.4 90.0 90.0  PLT 221 216 172 [id number]   Basic Metabolic Panel: Recent Labs  Lab 03/18/19 0842 03/19/19 0235 03/21/19 2303 03/23/19 0021  NA 138 139 134* 134*  K 3.5 3.9 4.5 4.3  CL 104 108 99 96*  CO2 23 21* 26 27  GLUCOSE 100* 134* 108* 124*  BUN 8 7 10 12   CREATININE 0.73 0.55 0.61 0.64  CALCIUM 8.7* 8.5* 8.3* 8.5*  MG  --   --   --  2.0  PHOS  --   --   --  4.8*   GFR: Estimated Creatinine Clearance: 116.7 mL/min (by C-G formula based on SCr of 0.64 mg/dL). Liver Function Tests: Recent Labs  Lab 03/21/19 2303 03/23/19 0021  AST 17 18  ALT 18 18  ALKPHOS 54 54  BILITOT 0.6 0.7  PROT 6.2* 6.5  ALBUMIN 3.0* 3.1*   No results for input(s): LIPASE, AMYLASE in the last 168 hours. No results for input(s): AMMONIA in the last 168 hours. Coagulation Profile: No results for input(s): INR, PROTIME in the last 168 hours. Cardiac Enzymes: No results for input(s): CKTOTAL, CKMB, CKMBINDEX, TROPONINI in the last 168 hours. BNP (last 3 results) No results for input(s): PROBNP in the  last 8760 hours. HbA1C: No results for input(s): HGBA1C in the last 72 hours. CBG: Recent Labs  Lab 03/22/19 1706 03/22/19 2011 03/23/19 0726  GLUCAP 110* 109* 113*   Lipid Profile: No results for input(s): CHOL, HDL, LDLCALC, TRIG, CHOLHDL, LDLDIRECT in the last 72 hours. Thyroid Function Tests: No results for input(s): TSH, T4TOTAL, FREET4, T3FREE, THYROIDAB in the last 72 hours. Anemia Panel: Recent Labs    03/21/19 2303 03/23/19 0021  FERRITIN 381* 405*   Urine analysis:    Component Value Date/Time   COLORURINE AMBER (A) 02/06/2019 1550   APPEARANCEUR HAZY (A) 02/06/2019 1550   LABSPEC 1.025 02/06/2019 1550   PHURINE 7.0 02/06/2019 1550   GLUCOSEU NEGATIVE 02/06/2019 1550   HGBUR NEGATIVE 02/06/2019 1550   HGBUR trace-intact 10/12/2008 1058   BILIRUBINUR SMALL (A) 02/06/2019 1550   BILIRUBINUR negative  09/21/2016 0911   KETONESUR 20 (A) 02/06/2019 1550   PROTEINUR 30 (A) 02/06/2019 1550   UROBILINOGEN 0.2 09/21/2016 0911   UROBILINOGEN 0.2 10/12/2008 1058   NITRITE POSITIVE (A) 02/06/2019 1550   LEUKOCYTESUR NEGATIVE 02/06/2019 1550   Recent Results (from the past 240 hour(s))  Novel Coronavirus, NAA (Hosp order, Send-out to Ref Lab; TAT 18-24 hrs     Status: None   Collection Time: 03/14/19  1:15 PM   Specimen: Nasopharyngeal Swab; Respiratory  Result Value Ref Range Status   SARS-CoV-2, NAA NOT DETECTED NOT DETECTED Final    Comment: (NOTE) This nucleic acid amplification test was developed and its performance characteristics determined by [employer]. Nucleic acid amplification tests include PCR and TMA. This test has not been FDA cleared or approved. This test has been authorized by FDA under an Emergency Use Authorization (EUA). This test is only authorized for the duration of time the declaration that circumstances exist justifying the authorization of the emergency use of in vitro diagnostic tests for detection of SARS-CoV-2 virus and/or diagnosis of COVID-19 infection under section 564(b)(1) of the Act, 21 U.S.C. [id number]) (1), unless the authorization is terminated or revoked sooner. When diagnostic testing is negative, the possibility of a false negative result should be considered in the context of a patient's recent exposures and the presence of clinical signs and symptoms consistent with COVID-19. An individual without symptoms of COVID- 19 and who is not shedding SARS-CoV-2 vi rus would expect to have a negative (not detected) result in this assay. Performed At: [facility] [address] [place], [place] [id number] Rush Farmer MD [id number]    [place]  Final    Comment: Performed at [facility], [place] [address], [place], [place] [address]  SARS Coronavirus 2 by RT PCR (hospital order, performed in [facility] hospital lab) Nasopharyngeal Nasopharyngeal Swab     Status: Abnormal   Collection Time: 03/19/19 12:21 PM   Specimen: Nasopharyngeal Swab  Result Value Ref Range Status   SARS Coronavirus 2 POSITIVE (A) NEGATIVE Final    Comment: RESULT CALLED TO, READ BACK BY AND VERIFIED WITH: RN T HARVEY [id number] AT [id number] BY CM (NOTE) If result is NEGATIVE SARS-CoV-2 target nucleic acids are NOT DETECTED. The SARS-CoV-2 RNA is generally detectable in upper and lower  respiratory specimens during the acute phase of infection. The lowest  concentration of SARS-CoV-2 viral copies this assay can detect is 250  copies / mL. A negative result does not preclude SARS-CoV-2 infection  and should not be used as the sole basis for treatment or other  patient management decisions.  A negative result may occur with  improper specimen collection / handling, submission of specimen other  than nasopharyngeal swab, presence of viral mutation(s) within the  areas targeted by this assay, and inadequate number of viral copies  (<250 copies / mL). A negative result must be combined with clinical  observations, patient history, and epidemiological information. If result is POSITIVE SARS-CoV-2 target nucleic acids are DETECTED. The  SARS-CoV-2 RNA is generally detectable in upper and lower  respiratory specimens during the acute phase of infection.  Positive  results are indicative of active infection with SARS-CoV-2.  Clinical  correlation with patient history and other diagnostic information is  necessary to determine patient infection status.  Positive results do  not rule out bacterial infection or co-infection with other viruses. If result is PRESUMPTIVE POSTIVE SARS-CoV-2 nucleic acids MAY BE PRESENT.   A presumptive positive result was obtained on the submitted specimen  and confirmed on repeat testing.  While 2019 novel coronavirus  (SARS-CoV-2) nucleic acids may be present in the submitted sample    additional confirmatory testing may be necessary for epidemiological  and / or clinical management purposes  to differentiate between  SARS-CoV-2 and other Sarbecovirus currently known to infect humans.  If clinically indicated additional testing with an alternate test  methodology [phone]) is ad vised. The SARS-CoV-2 RNA is generally  detectable in upper and lower respiratory specimens during the acute  phase of infection. The expected result is Negative. Fact Sheet for Patients:  StrictlyIdeas.no Fact Sheet for Healthcare Providers: BankingDealers.co.za This test is not yet approved or cleared by the [place] FDA and has been authorized for detection and/or diagnosis of SARS-CoV-2 by FDA under an Emergency Use Authorization (EUA).  This EUA will remain in effect (meaning this test can be used) for the duration of the COVID-19 declaration under Section 564(b)(1) of the Act, 21 U.S.C. section 360bbb-3(b)(1), unless the authorization is terminated or revoked sooner. Performed at [facility], [place] [address] [place], [place] [address]       Radiology Studies: Dg Chest Port 1 View  Result Date: 03/21/2019 CLINICAL DATA:  Febrile EXAM: PORTABLE CHEST 1 VIEW COMPARISON:  02/06/2019 FINDINGS: Cardiomegaly. Both lungs are clear. The visualized skeletal structures are unremarkable. IMPRESSION: Cardiomegaly without acute abnormality of the lungs in AP portable projection. Electronically Signed   By: Eddie Candle M.D.   On: 03/21/2019 14:51    Scheduled Meds: . atorvastatin  40 mg Oral QHS  . bisacodyl  10 mg Rectal Once  . budesonide  1 puff Inhalation BID  . docusate sodium  100 mg Oral BID  . enoxaparin (LOVENOX) injection  0.5 mg/kg Subcutaneous Q24H  . erythromycin  1 application Both Eyes QHS  . famotidine  20 mg Oral BID  . feeding supplement (ENSURE ENLIVE)  237 mL Oral TID BM  . fluticasone  2 spray Each Nare Daily   . furosemide  20 mg Oral Daily  . insulin aspart  0-5 Units Subcutaneous QHS  . insulin aspart  0-9 Units Subcutaneous TID WC  . lisinopril  40 mg Oral Daily  . mometasone-formoterol  2 puff Inhalation BID  . montelukast  10 mg Oral QHS  . multivitamin with minerals  1 tablet Oral Daily  . norethindrone  2.5 mg Oral QHS  . olopatadine  1 drop Both Eyes QHS  . polyethylene glycol  17 g Oral Daily  . senna  2 tablet Oral Daily  . vitamin C  1,000 mg  Oral Daily  . zinc sulfate  220 mg Oral Daily   Continuous Infusions:   LOS: 5 days   Time spent: 35 minutes.  Patrecia Pour, MD [facility] [email or web address] Password [facility] 03/23/2019, 8:33 AM

## 2019-03-24 ENCOUNTER — Ambulatory Visit: Payer: Self-pay

## 2019-03-24 LAB — GLUCOSE, CAPILLARY
Glucose-Capillary: 92 mg/dL (ref 70–99)
Glucose-Capillary: 120 mg/dL — ABNORMAL HIGH (ref 70–99)
Glucose-Capillary: 106 mg/dL — ABNORMAL HIGH (ref 70–99)
Glucose-Capillary: 103 mg/dL — ABNORMAL HIGH (ref 70–99)

## 2019-03-24 LAB — C-REACTIVE PROTEIN: CRP: 6 mg/dL — ABNORMAL HIGH (ref <1.0)

## 2019-03-24 LAB — D-DIMER, QUANTITATIVE: D-Dimer, Quant: 1.37 ug/mL-FEU — ABNORMAL HIGH (ref 0.00–0.50)

## 2019-03-24 MED ORDER — WITCH HAZEL-GLYCERIN EX PADS
MEDICATED_PAD | CUTANEOUS | Status: DC | PRN
  Administered 2019-03-24 – 2019-03-26 (×3): via TOPICAL
  Filled 2019-03-24: qty 100

## 2019-03-24 MED ORDER — PHENYLEPH-SHARK LIV OIL-MO-PET 0.25-3-14-71.9 % RE OINT: TOPICAL_OINTMENT | Freq: Two times a day (BID) | RECTAL | Status: DC | PRN

## 2019-03-24 MED ORDER — FLEET ENEMA 7-19 GM/118ML RE ENEM
1.0000 | ENEMA | Freq: Once | RECTAL | Status: AC
  Administered 2019-03-24: 1 via RECTAL
  Filled 2019-03-24: qty 1

## 2019-03-24 MED ORDER — HYDROCORTISONE (PERIANAL) 2.5 % EX CREA
TOPICAL_CREAM | Freq: Two times a day (BID) | CUTANEOUS | Status: DC | PRN
  Administered 2019-03-24 – 2019-03-25 (×2): via RECTAL
  Filled 2019-03-24: qty 28.35

## 2019-03-24 NOTE — Plan of Care (Signed)
Will continue with plan of care. 

## 2019-03-24 NOTE — Progress Notes (Signed)
[facility] Progress Note  6 Days Post-Op  Subjective: CC-  Abdominal wound pain improving. Denies n/v. BM this morning. Tolerating diet but not eating much at all.   Objective: Vital signs in last 24 hours: Temp:  [97.5 F (36.4 C)-99.8 F (37.7 C)] 98.4 F (36.9 C) (10/19 0951) Pulse Rate:  [73-77] 77 (10/19 0951) Resp:  [15-21] 20 (10/19 0951) BP: (86-111)/(51-61) 111/57 (10/19 0951) SpO2:  [95 %] 95 % (10/19 0728) Last BM Date: 03/21/19  Intake/Output from previous day: 10/18 0701 - 10/19 0700 In: -  Out: 200 [Urine:200] Intake/Output this shift: No intake/output data recorded.  PE: Gen: alert, NAD Pulm: rate and effort normal on RA Skin: warm and dry Abd: soft, obese, mild tenderness over wound otherwise nontender. Wound measure 7x3x4cm with 6.5cm tunneling proximally     Lab Results:  Recent Labs    03/21/19 2303 03/23/19 0021  WBC 5.0 4.5  HGB 11.1* 11.2*  HCT 36.0 35.9*  PLT 172 159   BMET Recent Labs    03/21/19 2303 03/23/19 0021  NA 134* 134*  K 4.5 4.3  CL 99 96*  CO2 26 27  GLUCOSE 108* 124*  BUN 10 12  CREATININE 0.61 0.64  CALCIUM 8.3* 8.5*   PT/INR No results for input(s): LABPROT, INR in the last 72 hours. CMP     Component Value Date/Time   NA 134 (L) 03/23/2019 0021   NA 142 02/27/2019 1314   K 4.3 03/23/2019 0021   CL 96 (L) 03/23/2019 0021   CO2 27 03/23/2019 0021   GLUCOSE 124 (H) 03/23/2019 0021   BUN 12 03/23/2019 0021   BUN 10 02/27/2019 1314   CREATININE 0.64 03/23/2019 0021   CREATININE 0.64 08/26/2015 0900   CALCIUM 8.5 (L) 03/23/2019 0021   PROT 6.5 03/23/2019 0021   PROT 6.8 02/27/2019 1314   ALBUMIN 3.1 (L) 03/23/2019 0021   ALBUMIN 3.9 02/27/2019 1314   AST 18 03/23/2019 0021   ALT 18 03/23/2019 0021   ALKPHOS 54 03/23/2019 0021   BILITOT 0.7 03/23/2019 0021   BILITOT 0.8 02/27/2019 1314   GFRNONAA >60 03/23/2019 0021   GFRNONAA >89 08/26/2015 0900   GFRAA >60 03/23/2019 0021   GFRAA  >89 08/26/2015 0900   Lipase     Component Value Date/Time   LIPASE 22 02/02/2019 1810       Studies/Results: Dg Chest Port 1 View  Result Date: 03/23/2019 CLINICAL DATA:  Hypoxia, COVID positive EXAM: PORTABLE CHEST 1 VIEW COMPARISON:  03/21/2019 FINDINGS: Mild linear/patchy opacities in the right upper lobe, left upper lobe/lingula, and left lower lobe. No pleural effusion or pneumothorax. Cardiomegaly. IMPRESSION: Mild scattered patchy opacities, compatible with pneumonia in this patient with known COVID. Electronically Signed   By: Julian Hy M.D.   On: 03/23/2019 09:34    Anti-infectives: Anti-infectives (From admission, onward)   Start     Dose/Rate Route Frequency Ordered Stop   03/24/19 1000  remdesivir 100 mg in sodium chloride 0.9 % 250 mL IVPB     100 mg 500 mL/hr over 30 Minutes Intravenous Every 24 hours 03/23/19 0916 03/28/19 0959   03/23/19 1100  remdesivir 200 mg in sodium chloride 0.9 % 250 mL IVPB     200 mg 500 mL/hr over 30 Minutes Intravenous Once 03/23/19 0916 03/23/19 1310   03/18/19 1215  fluconazole (DIFLUCAN) tablet 100 mg  Status:  Discontinued     100 mg Oral Daily 03/18/19 1202 03/18/19 1241   03/18/19  0400  vancomycin (VANCOCIN) 1,500 mg in sodium chloride 0.9 % 500 mL IVPB     1,500 mg 250 mL/hr over 120 Minutes Intravenous On call to O.R. 03/17/19 0703 03/18/19 1054       Assessment/Plan OSA  HTN Asthma Anxiety  CHF  COVID-19 infection  S/P ex lap, SBR and primary repair of ventral hernia 02/03/2019 Wound infection after surgery  - S/PAbdominal wound exploration, placement of wound VAC 3 x 6 x 12 cm deep, Dr. Georgette Dover, 10/13 - POD#6 - Continue wound vac changes MWF. Wound is still deep and tunnels proximally, continue white foam at base and top with small piece of black foam. Available to help with next vac change on Wednesday if needed, otherwise will plan to see wound again on Friday.  [place] diet, dietician consult  VTE:  SCD's, lovenox [id number] & Diflucan once, remdesivir 10/19>> Foley:none Follow up:TBD   LOS: 6 days    [place], [facility] 03/24/2019, 12:21 PM Please see Amion for pager number during day hours 7:00am-4:30pm

## 2019-03-24 NOTE — Progress Notes (Addendum)
PROGRESS NOTE  Kelsey Bullock  [id number] DOB: [birth year] [name]: 03/18/2019 PCP: [name], MD   Brief Narrative: Kelsey Bullock is a 58 y.o. female with a history of morbid obesity who was admitted following surgery for a poorly healing infraumbilical surgical wound. She had initially had an ex lap with small bowel resection for incarcerated ventral hernia on 02/02/2019. Slow wound healing, though discharged with drain removed on 9/8, having her sister perform wet-to-dry dressing changes. Due to continued pain at the site and failure of the wound to close, CT was performed 10/9 and showed 6x4x4cm collection of gas and debris in the subcutaneous fat at the site with deeper collection felt to be an area of infection at the level of the fascia, possibly tracking behind the rectus sheath. She was scheduled for surgery. General surgery, Dr. Georgette Dover, performed wound exploration with wound vac placement (3 x 6 x 12cm deep) on 10/13. Small gaps in the fascial closure were noted and necrotic fascia was sharply debrided prior to vac placement. Though pre-surgical covid testing was negative, this was repeated when her sister was diagnosed with covid-19. SARS-CoV-2 PCR collected 10/14 was positive on 10/16, albeit with no CXR infiltrates or respiratory symptoms. This made discharge with wound vac impossible as home health was reportedly unable to accommodate covid-positive patients and the patient was expected to require at least another week of vac care, so she was transferred to [facility] 10/16.   Assessment & Plan: Principal Problem:   Chronic abdominal wound infection Active Problems:   HYPERCHOLESTEROLEMIA   Morbid obesity ([place])   Anxiety state   HYPERTENSION, BENIGN SYSTEMIC   Asthma   Controlled type 2 diabetes mellitus with neuropathy (H[facility])   GERD (gastroesophageal reflux disease)   Diastolic CHF with preserved left ventricular function, NYHA class 2 ([place])   COVID-19 virus infection  Constipation  Chronic abdominal wound infection: Initially had ex lap with small bowel resection for incarcerated ventral hernia on 02/02/2019, now s/p wound exploration with wound vac placement (3 x 6 x 12cm deep) 10/13 by Dr. Georgette Dover.  - Will require ongoing wound vac changes/care prior to discharge. General surgery to follow and, along with [place] team, care for wound. Wound vac change scheduled MWF per surgery. - Continue analgesia per surgery's orders - Dietitian consulted  Acute hypoxic respiratory failure due to covid-19 pneumonia: Inflammatory marker trending upward, placed on oxygen due to hypoxia. CXR 10/18 personally reviewed showed patchy infiltrates progressing from prior. - Continue remdesivir 10/18 - 10/22 - Continue supplemental oxygen prn to maintain SpO2 >90%. - Will attempt to avoid steroids given problems with wound healing. - Avoid NSAIDs if able - Incentive spirometry encouraged, OOB as able. Stressed need for mobilizing and IS to reduce hypoventilation and atelectasis.  - Continue to trend LFTs, inflammatory markers. D-dimer up slightly today in concordance with CRP. If rising despite decreasing CRP or rising precipitously, would need to r/o DVT/PE. - Continue airborne-contact precautions per protocol.  - Due to covid-related hypercoagulability, giving weight-based lovenox prophylaxis (0.5mg /kg q24h) - Continue vitamin C and zinc  T2DM: Last HbA1c (6.1%) shows this is well controlled without medications.  - Monitor glucose and give SSI if needed to keep CBG 180mg /dl. If no hyperglycemia over next 24 hours, stop CBG checks.  Constipation: Has hx post-op ileus. Abd benign, +flatus. - Continue senna, colace, miralax, and give suppository. LBM is prior to surgery (>5 days ago). Ordered soap suds enema, not yet given. Pt has had success with Fleet in  the past, CrCl wnl, will order this today and escalate as needed.   Obesity: BMI 59.  - Weight loss recommended, though not at the  expense of protein.   Chronic HFpEF: Clinical volume status is difficult, but roughly appears euvolemic and able to take po. Enlarged cardiac silhouette noted on CXR 10/16 on AP projection.  - Continue daily lasix, lisinopril. Metabolic panel stable. - Follow I/O   DUB: Chronic.  - Continue norethindrone qHS.  Hyperlipidemia:  - Continue statin  Anxiety:  - Continue home clonazepam  Asthma: No wheezing.  - Continue singulair, dulera, prn albuterol. Also on omalizumab per MAR.  - Avoid nebulized therapy while at high risk of transmission.  - Without wheezing, do not feel steroids would be helpful for this indication.  GERD:  - Continue pepcid  DVT prophylaxis: Lovenox 80mg  [place] q24h Code Status: Full Family Communication: None at bedside Disposition Plan: Home once wound vac able to be discontinued and remdesivir completed.  Consultants:  General surgery  Procedures: Abdominal wound exploration, placement of wound VAC 3 x 6 x 12 cm deep Surgeon: Imogene Burn Tsuei  Antimicrobials:  Vancomycin x1 10/13   Remdesivir 10/18 - 10/22  Subjective: No chest pain or dyspnea, minimal cough, starting to feel achy all over though. Abdominal wound pain is stable, no other complaints.   Objective: Vitals:   03/23/19 1612 03/23/19 2000 03/24/19 0436 03/24/19 0728  BP: (!) 86/51  106/61 (!) 111/57  Pulse: 73   77  Resp: 15   (!) 21  Temp: (!) 97.5 F (36.4 C) 99.8 F (37.7 C) 98.1 F (36.7 C) 98.4 F (36.9 C)  TempSrc: Oral Oral Oral Axillary  SpO2: 95%   95%  Weight:        Intake/Output Summary (Last 24 hours) at 03/24/2019 [id number] Last data filed at 03/23/2019 1615 Gross per 24 hour  Intake --  Output 200 ml  Net -200 ml   Filed Weights   03/19/19 2100 03/21/19 2206  Weight: (!) 153.6 kg (!) 156.3 kg   Gen: Pleasant, tired-appearing female in no distress Pulm: Nonlabored tachypnea with no definite crackles or wheezes.  CV: Regular rate and rhythm. No murmur, rub, or  gallop. No definite JVD, no pitting dependent edema. GI: Abdomen soft, obese, non-tender, non-distended, with hypoactive bowel sounds.  Ext: Warm, no deformities Skin: Infraumbilical surgical wound with granulation tissue evident around vacuum foam dressing, no surrounding erythema or purulence.  Neuro: Alert and oriented. No focal neurological deficits. Psych: Judgement and insight appear fair. Mood euthymic & affect congruent. Behavior is appropriate.    Data Reviewed: I have personally reviewed following labs and imaging studies  CBC: Recent Labs  Lab 03/18/19 0842 03/19/19 0235 03/21/19 2303 03/23/19 0021  WBC 4.1 3.7* 5.0 4.5  NEUTROABS  --   --  3.1 2.5  HGB 11.5* 11.3* 11.1* 11.2*  HCT 35.8* 36.1 36.0 35.9*  MCV 89.3 89.4 90.0 90.0  PLT 221 216 172 [id number]   Basic Metabolic Panel: Recent Labs  Lab 03/18/19 0842 03/19/19 0235 03/21/19 2303 03/23/19 0021  NA 138 139 134* 134*  K 3.5 3.9 4.5 4.3  CL 104 108 99 96*  CO2 23 21* 26 27  GLUCOSE 100* 134* 108* 124*  BUN 8 7 10 12   CREATININE 0.73 0.55 0.61 0.64  CALCIUM 8.7* 8.5* 8.3* 8.5*  MG  --   --   --  2.0  PHOS  --   --   --  4.8*  GFR: Estimated Creatinine Clearance: 116.7 mL/min (by C-G formula based on SCr of 0.64 mg/dL). Liver Function Tests: Recent Labs  Lab 03/21/19 2303 03/23/19 0021  AST 17 18  ALT 18 18  ALKPHOS 54 54  BILITOT 0.6 0.7  PROT 6.2* 6.5  ALBUMIN 3.0* 3.1*   No results for input(s): LIPASE, AMYLASE in the last 168 hours. No results for input(s): AMMONIA in the last 168 hours. Coagulation Profile: No results for input(s): INR, PROTIME in the last 168 hours. Cardiac Enzymes: No results for input(s): CKTOTAL, CKMB, CKMBINDEX, TROPONINI in the last 168 hours. BNP (last 3 results) No results for input(s): PROBNP in the last 8760 hours. HbA1C: No results for input(s): HGBA1C in the last 72 hours. CBG: Recent Labs  Lab 03/23/19 0726 03/23/19 1159 03/23/19 1651 03/23/19 2259  03/24/19 0725  GLUCAP 113* 129* 156* 128* 92   Lipid Profile: No results for input(s): CHOL, HDL, LDLCALC, TRIG, CHOLHDL, LDLDIRECT in the last 72 hours. Thyroid Function Tests: No results for input(s): TSH, T4TOTAL, FREET4, T3FREE, THYROIDAB in the last 72 hours. Anemia Panel: Recent Labs    03/21/19 2303 03/23/19 0021  FERRITIN 381* 405*   Urine analysis:    Component Value Date/Time   COLORURINE AMBER (A) 02/06/2019 1550   APPEARANCEUR HAZY (A) 02/06/2019 1550   LABSPEC 1.025 02/06/2019 1550   PHURINE 7.0 02/06/2019 1550   GLUCOSEU NEGATIVE 02/06/2019 1550   HGBUR NEGATIVE 02/06/2019 1550   HGBUR trace-intact 10/12/2008 1058   BILIRUBINUR SMALL (A) 02/06/2019 1550   BILIRUBINUR negative 09/21/2016 0911   KETONESUR 20 (A) 02/06/2019 1550   PROTEINUR 30 (A) 02/06/2019 1550   UROBILINOGEN 0.2 09/21/2016 0911   UROBILINOGEN 0.2 10/12/2008 1058   NITRITE POSITIVE (A) 02/06/2019 1550   LEUKOCYTESUR NEGATIVE 02/06/2019 1550   Recent Results (from the past 240 hour(s))  Novel Coronavirus, NAA (Hosp order, Send-out to Ref Lab; TAT 18-24 hrs     Status: None   Collection Time: 03/14/19  1:15 PM   Specimen: Nasopharyngeal Swab; Respiratory  Result Value Ref Range Status   SARS-CoV-2, NAA NOT DETECTED NOT DETECTED Final    Comment: (NOTE) This nucleic acid amplification test was developed and its performance characteristics determined by [employer]. Nucleic acid amplification tests include PCR and TMA. This test has not been FDA cleared or approved. This test has been authorized by FDA under an Emergency Use Authorization (EUA). This test is only authorized for the duration of time the declaration that circumstances exist justifying the authorization of the emergency use of in vitro diagnostic tests for detection of SARS-CoV-2 virus and/or diagnosis of COVID-19 infection under section 564(b)(1) of the Act, 21 U.S.C. [id number]) (1), unless the authorization is  terminated or revoked sooner. When diagnostic testing is negative, the possibility of a false negative result should be considered in the context of a patient's recent exposures and the presence of clinical signs and symptoms consistent with COVID-19. An individual without symptoms of COVID- 19 and who is not shedding SARS-CoV-2 vi rus would expect to have a negative (not detected) result in this assay. Performed At: [facility] [address] [place], [place] [id number] Rush Farmer MD [id number]    [place]  Final    Comment: Performed at [facility], [place] [address] [place], [place] [address]  SARS Coronavirus 2 by RT PCR (hospital order, performed in [facility] hospital lab) Nasopharyngeal Nasopharyngeal Swab     Status: Abnormal   Collection Time: 03/19/19 12:21  PM   Specimen: Nasopharyngeal Swab  Result Value Ref Range Status   SARS Coronavirus 2 POSITIVE (A) NEGATIVE Final    Comment: RESULT CALLED TO, READ BACK BY AND VERIFIED WITH: RN T HARVEY [id number] AT [id number] BY CM (NOTE) If result is NEGATIVE SARS-CoV-2 target nucleic acids are NOT DETECTED. The SARS-CoV-2 RNA is generally detectable in upper and lower  respiratory specimens during the acute phase of infection. The lowest  concentration of SARS-CoV-2 viral copies this assay can detect is 250  copies / mL. A negative result does not preclude SARS-CoV-2 infection  and should not be used as the sole basis for treatment or other  patient management decisions.  A negative result may occur with  improper specimen collection / handling, submission of specimen other  than nasopharyngeal swab, presence of viral mutation(s) within the  areas targeted by this assay, and inadequate number of viral copies  (<250 copies / mL). A negative result must be combined with clinical  observations, patient history, and epidemiological information. If result is POSITIVE SARS-CoV-2 target nucleic  acids are DETECTED. The  SARS-CoV-2 RNA is generally detectable in upper and lower  respiratory specimens during the acute phase of infection.  Positive  results are indicative of active infection with SARS-CoV-2.  Clinical  correlation with patient history and other diagnostic information is  necessary to determine patient infection status.  Positive results do  not rule out bacterial infection or co-infection with other viruses. If result is PRESUMPTIVE POSTIVE SARS-CoV-2 nucleic acids MAY BE PRESENT.   A presumptive positive result was obtained on the submitted specimen  and confirmed on repeat testing.  While 2019 novel coronavirus  (SARS-CoV-2) nucleic acids may be present in the submitted sample  additional confirmatory testing may be necessary for epidemiological  and / or clinical management purposes  to differentiate between  SARS-CoV-2 and other Sarbecovirus currently known to infect humans.  If clinically indicated additional testing with an alternate test  methodology [phone]) is ad vised. The SARS-CoV-2 RNA is generally  detectable in upper and lower respiratory specimens during the acute  phase of infection. The expected result is Negative. Fact Sheet for Patients:  StrictlyIdeas.no Fact Sheet for Healthcare Providers: BankingDealers.co.za This test is not yet approved or cleared by the [place] FDA and has been authorized for detection and/or diagnosis of SARS-CoV-2 by FDA under an Emergency Use Authorization (EUA).  This EUA will remain in effect (meaning this test can be used) for the duration of the COVID-19 declaration under Section 564(b)(1) of the Act, 21 U.S.C. section 360bbb-3(b)(1), unless the authorization is terminated or revoked sooner. Performed at [facility], [place] [address] [place], [place] [address]       Radiology Studies: Dg Chest Port 1 View  Result Date: 03/23/2019 CLINICAL DATA:   Hypoxia, COVID positive EXAM: PORTABLE CHEST 1 VIEW COMPARISON:  03/21/2019 FINDINGS: Mild linear/patchy opacities in the right upper lobe, left upper lobe/lingula, and left lower lobe. No pleural effusion or pneumothorax. Cardiomegaly. IMPRESSION: Mild scattered patchy opacities, compatible with pneumonia in this patient with known COVID. Electronically Signed   By: Julian Hy M.D.   On: 03/23/2019 09:34    Scheduled Meds: . atorvastatin  40 mg Oral QHS  . budesonide  1 puff Inhalation BID  . docusate sodium  100 mg Oral BID  . enoxaparin (LOVENOX) injection  0.5 mg/kg Subcutaneous Q24H  . erythromycin  1 application Both Eyes QHS  . famotidine  20 mg Oral BID  .  feeding supplement (ENSURE ENLIVE)  237 mL Oral TID BM  . fluticasone  2 spray Each Nare Daily  . furosemide  20 mg Oral Daily  . insulin aspart  0-5 Units Subcutaneous QHS  . insulin aspart  0-9 Units Subcutaneous TID WC  . lisinopril  40 mg Oral Daily  . mometasone-formoterol  2 puff Inhalation BID  . montelukast  10 mg Oral QHS  . multivitamin with minerals  1 tablet Oral Daily  . norethindrone  2.5 mg Oral QHS  . olopatadine  1 drop Both Eyes QHS  . polyethylene glycol  17 g Oral Daily  . senna  2 tablet Oral Daily  . vitamin C  1,000 mg Oral Daily  . zinc sulfate  220 mg Oral Daily   Continuous Infusions: . remdesivir 100 mg in NS 250 mL 100 mg (03/24/19 0846)     LOS: 6 days   Time spent: 35 minutes.  Patrecia Pour, MD [facility] [email or web address] Password [email or web address] 03/24/2019, 9:39 AM

## 2019-03-25 LAB — COMPREHENSIVE METABOLIC PANEL
ALT: 19 U/L (ref 0–44)
AST: 24 U/L (ref 15–41)
Albumin: 3.1 g/dL — ABNORMAL LOW (ref 3.5–5.0)
Alkaline Phosphatase: 56 U/L (ref 38–126)
Anion gap: 11 (ref 5–15)
BUN: 14 mg/dL (ref 6–20)
CO2: 27 mmol/L (ref 22–32)
Calcium: 8.4 mg/dL — ABNORMAL LOW (ref 8.9–10.3)
Chloride: 98 mmol/L (ref 98–111)
Creatinine, Ser: 0.59 mg/dL (ref 0.44–1.00)
GFR calc Af Amer: >60 mL/min (ref >60)
GFR calc non Af Amer: >60 mL/min (ref >60)
Glucose, Bld: 107 mg/dL — ABNORMAL HIGH (ref 70–99)
Potassium: 4 mmol/L (ref 3.5–5.1)
Sodium: 136 mmol/L (ref 135–145)
Total Bilirubin: 0.9 mg/dL (ref 0.3–1.2)
Total Protein: 6.4 g/dL — ABNORMAL LOW (ref 6.5–8.1)

## 2019-03-25 LAB — CBC WITH DIFFERENTIAL/PLATELET
Abs Immature Granulocytes: 0.01 10*3/uL (ref 0.00–0.07)
Basophils Absolute: 0 10*3/uL (ref 0.0–0.1)
Basophils Relative: 0 %
Eosinophils Absolute: 0 10*3/uL (ref 0.0–0.5)
Eosinophils Relative: 0 %
HCT: 36.1 % (ref 36.0–46.0)
Hemoglobin: 11.4 g/dL — ABNORMAL LOW (ref 12.0–15.0)
Immature Granulocytes: 0 %
Lymphocytes Relative: 42 %
Lymphs Abs: 1.6 10*3/uL (ref 0.7–4.0)
MCH: 28.1 pg (ref 26.0–34.0)
MCHC: 31.6 g/dL (ref 30.0–36.0)
MCV: 88.9 fL (ref 80.0–100.0)
Monocytes Absolute: 0.4 10*3/uL (ref 0.1–1.0)
Monocytes Relative: 11 %
Neutro Abs: 1.7 10*3/uL (ref 1.7–7.7)
Neutrophils Relative %: 47 %
Platelets: 148 10*3/uL — ABNORMAL LOW (ref 150–400)
RBC: 4.06 MIL/uL (ref 3.87–5.11)
RDW: 14.8 % (ref 11.5–15.5)
WBC: 3.7 10*3/uL — ABNORMAL LOW (ref 4.0–10.5)
nRBC: 0 % (ref 0.0–0.2)

## 2019-03-25 LAB — GLUCOSE, CAPILLARY
Glucose-Capillary: 112 mg/dL — ABNORMAL HIGH (ref 70–99)
Glucose-Capillary: 121 mg/dL — ABNORMAL HIGH (ref 70–99)
Glucose-Capillary: 137 mg/dL — ABNORMAL HIGH (ref 70–99)
Glucose-Capillary: 127 mg/dL — ABNORMAL HIGH (ref 70–99)

## 2019-03-25 LAB — D-DIMER, QUANTITATIVE: D-Dimer, Quant: 1.4 ug/mL-FEU — ABNORMAL HIGH (ref 0.00–0.50)

## 2019-03-25 LAB — C-REACTIVE PROTEIN: CRP: 4 mg/dL — ABNORMAL HIGH (ref <1.0)

## 2019-03-25 NOTE — Plan of Care (Signed)
Will continue with plan of care. 

## 2019-03-25 NOTE — Progress Notes (Signed)
PROGRESS NOTE  Kelsey Bullock  [id number] DOB: 1960/08/01 [name]: 03/18/2019 PCP: [name], MD   Brief Narrative: Kelsey Bullock is a 58 y.o. female with a history of morbid obesity who was admitted following surgery for a poorly healing infraumbilical surgical wound. She had initially had an ex lap with small bowel resection for incarcerated ventral hernia on 02/02/2019. Slow wound healing, though discharged with drain removed on 9/8, having her sister perform wet-to-dry dressing changes. Due to continued pain at the site and failure of the wound to close, CT was performed 10/9 and showed 6x4x4cm collection of gas and debris in the subcutaneous fat at the site with deeper collection felt to be an area of infection at the level of the fascia, possibly tracking behind the rectus sheath. She was scheduled for surgery. General surgery, Dr. Georgette Dover, performed wound exploration with wound vac placement (3 x 6 x 12cm deep) on 10/13. Small gaps in the fascial closure were noted and necrotic fascia was sharply debrided prior to vac placement. Though pre-surgical covid testing was negative, this was repeated when her sister was diagnosed with covid-19. SARS-CoV-2 PCR collected 10/14 was positive on 10/16, albeit with no CXR infiltrates or respiratory symptoms. This made discharge with wound vac impossible as home health was reportedly unable to accommodate covid-positive patients and the patient was expected to require at least another week of vac care, so she was transferred to [facility] 10/16.   Assessment & Plan: Principal Problem:   Chronic abdominal wound infection Active Problems:   HYPERCHOLESTEROLEMIA   Morbid obesity ([place])   Anxiety state   HYPERTENSION, BENIGN SYSTEMIC   Asthma   Controlled type 2 diabetes mellitus with neuropathy (HCC)   GERD (gastroesophageal reflux disease)   Diastolic CHF with preserved left ventricular function, NYHA class 2 ([place])   COVID-19 virus infection  Constipation  Chronic abdominal wound infection: Initially had ex lap with small bowel resection for incarcerated ventral hernia on 02/02/2019, now s/p wound exploration with wound vac placement (3 x 6 x 12cm deep) 10/13 by Dr. Georgette Dover.  - Will require ongoing wound vac changes/care prior to discharge. General surgery to follow and, along with [facility] team, care for wound. Wound vac change scheduled MWF per surgery. See pictures of undressed wound in note 10/19. Next reevaluation is 10/21 if needed, otherwise, 10/23.  - Continue analgesia per surgery's orders - Dietitian consulted  Acute hypoxic respiratory failure due to covid-19 pneumonia: Inflammatory marker trending upward, placed on oxygen due to hypoxia. CXR 10/18 personally reviewed showed patchy infiltrates progressing from prior. - Continue remdesivir 10/18 - 10/22 - Continue supplemental oxygen prn to maintain SpO2 >90%. - Will attempt to avoid steroids given problems with wound healing. - Avoid NSAIDs if able - Incentive spirometry encouraged, OOB as able. Stressed need for mobilizing and IS to reduce hypoventilation and atelectasis.  - Continue to trend LFTs, inflammatory markers. D-dimer up slightly today in concordance with CRP. If rising despite decreasing CRP or rising precipitously, would need to r/o DVT/PE. - Continue airborne-contact precautions per protocol.  - Due to covid-related hypercoagulability, giving weight-based lovenox prophylaxis (0.5mg /kg q24h) - Continue vitamin C and zinc  T2DM: Last HbA1c (6.1%) shows this is well controlled without medications. CBGs well controlled, not on steroids. No significant insulin requirement, can stop checking.   Constipation: Has hx post-op ileus. - Continue senna, colace, miralax, and give suppository. LBM is 10/19, required enema. Continue preventive regimen, OOB as able.   Obesity: BMI 59.  -  Weight loss recommended, though not at the expense of protein.   Chronic HFpEF: Clinical  volume status is difficult, but roughly appears euvolemic and able to take po. Enlarged cardiac silhouette noted on CXR 10/16 on AP projection.  - Continue daily lasix, lisinopril. Metabolic panel stable. - Follow I/O   DUB: Chronic, quiescent.  - Continue norethindrone qHS.  Hyperlipidemia:  - Continue statin  Anxiety:  - Continue home clonazepam  Asthma: No wheezing.  - Continue singulair, dulera, prn albuterol. Also on omalizumab per MAR.  - Avoid nebulized therapy while at high risk of transmission.  - Without wheezing, do not feel steroids would be helpful for this indication.  GERD:  - Continue pepcid  Hemorrhoids:  - Topical therapies.  DVT prophylaxis: Lovenox 80mg  [place] q24h Code Status: Full Family Communication: None at bedside Disposition Plan: Home once wound vac able to be discontinued and remdesivir completed.  Consultants:  General surgery  Procedures: Abdominal wound exploration, placement of wound VAC 3 x 6 x 12 cm deep Surgeon: Imogene Burn Tsuei  Antimicrobials:  Vancomycin x1 10/13   Remdesivir 10/18 - 10/22  Subjective: No shortness of breath but was placed back on oxygen overnight. No chest pain, feels generally uncomfortable in bed. Hemorrhoids began bothering her yesterday, improved with tucks pads. Had BM yesterday, eating ok.  Objective: Vitals:   03/24/19 2109 03/24/19 2359 03/25/19 0325 03/25/19 0753  BP: (!) 105/58 (!) 106/55 (!) 110/56 (!) 100/51  Pulse: 72 74 76 72  Resp: 15 (!) 25 (!) 23 20  Temp: 98.5 F (36.9 C) 98.9 F (37.2 C) 98.2 F (36.8 C) 98 F (36.7 C)  TempSrc: Oral Oral Oral Oral  SpO2: 90% 93% 92% 91%  Weight:      Height:       No intake or output data in the 24 hours ending 03/25/19 0941 Filed Weights   03/19/19 2100 03/21/19 2206  Weight: (!) 153.6 kg (!) 156.3 kg   Gen: 58 y.o. female in no distress Pulm: Nonlabored breathing 1L O2. Clear, distant. CV: Regular rate and rhythm. No murmur, rub, or gallop. No  dependent edema. GI: Abdomen soft, obese, mildly tender around wound, otherwise non-tender, non-distended, with normoactive bowel sounds.  Ext: Warm, no deformities Skin: No new rashes, lesions or ulcers on visualized skin. Infraumbilical wound with black foam dressing, vac in place. No surrounding erythema or cellulitis Neuro: Alert and oriented. No focal neurological deficits. Psych: Judgement and insight appear fair. Mood euthymic & affect congruent. Behavior is appropriate.    Data Reviewed: I have personally reviewed following labs and imaging studies  CBC: Recent Labs  Lab 03/19/19 0235 03/21/19 2303 03/23/19 0021 03/25/19 0430  WBC 3.7* 5.0 4.5 3.7*  NEUTROABS  --  3.1 2.5 1.7  HGB 11.3* 11.1* 11.2* 11.4*  HCT 36.1 36.0 35.9* 36.1  MCV 89.4 90.0 90.0 88.9  PLT 216 172 159 [id number]*   Basic Metabolic Panel: Recent Labs  Lab 03/19/19 0235 03/21/19 2303 03/23/19 0021 03/25/19 0430  NA 139 134* 134* 136  K 3.9 4.5 4.3 4.0  CL 108 99 96* 98  CO2 21* 26 27 27   GLUCOSE 134* 108* 124* 107*  BUN 7 10 12 14   CREATININE 0.55 0.61 0.64 0.59  CALCIUM 8.5* 8.3* 8.5* 8.4*  MG  --   --  2.0  --   PHOS  --   --  4.8*  --    GFR: Estimated Creatinine Clearance: 121.9 mL/min (by C-G formula based on  SCr of 0.59 mg/dL). Liver Function Tests: Recent Labs  Lab 03/21/19 2303 03/23/19 0021 03/25/19 0430  AST 17 18 24   ALT 18 18 19   ALKPHOS 54 54 56  BILITOT 0.6 0.7 0.9  PROT 6.2* 6.5 6.4*  ALBUMIN 3.0* 3.1* 3.1*   No results for input(s): LIPASE, AMYLASE in the last 168 hours. No results for input(s): AMMONIA in the last 168 hours. Coagulation Profile: No results for input(s): INR, PROTIME in the last 168 hours. Cardiac Enzymes: No results for input(s): CKTOTAL, CKMB, CKMBINDEX, TROPONINI in the last 168 hours. BNP (last 3 results) No results for input(s): PROBNP in the last 8760 hours. HbA1C: No results for input(s): HGBA1C in the last 72 hours. CBG: Recent Labs  Lab  03/24/19 0725 03/24/19 1128 03/24/19 1658 03/24/19 2057 03/25/19 0724  GLUCAP 92 120* 106* 103* 112*   Lipid Profile: No results for input(s): CHOL, HDL, LDLCALC, TRIG, CHOLHDL, LDLDIRECT in the last 72 hours. Thyroid Function Tests: No results for input(s): TSH, T4TOTAL, FREET4, T3FREE, THYROIDAB in the last 72 hours. Anemia Panel: Recent Labs    03/23/19 0021  FERRITIN 405*   Urine analysis:    Component Value Date/Time   COLORURINE AMBER (A) 02/06/2019 1550   APPEARANCEUR HAZY (A) 02/06/2019 1550   LABSPEC 1.025 02/06/2019 1550   PHURINE 7.0 02/06/2019 1550   GLUCOSEU NEGATIVE 02/06/2019 1550   HGBUR NEGATIVE 02/06/2019 1550   HGBUR trace-intact 10/12/2008 1058   BILIRUBINUR SMALL (A) 02/06/2019 1550   BILIRUBINUR negative 09/21/2016 0911   KETONESUR 20 (A) 02/06/2019 1550   PROTEINUR 30 (A) 02/06/2019 1550   UROBILINOGEN 0.2 09/21/2016 0911   UROBILINOGEN 0.2 10/12/2008 1058   NITRITE POSITIVE (A) 02/06/2019 1550   LEUKOCYTESUR NEGATIVE 02/06/2019 1550   Recent Results (from the past 240 hour(s))  SARS Coronavirus 2 by RT PCR (hospital order, performed in [facility] hospital lab) Nasopharyngeal Nasopharyngeal Swab     Status: Abnormal   Collection Time: 03/19/19 12:21 PM   Specimen: Nasopharyngeal Swab  Result Value Ref Range Status   SARS Coronavirus 2 POSITIVE (A) NEGATIVE Final    Comment: RESULT CALLED TO, READ BACK BY AND VERIFIED WITH: RN T HARVEY [id number] AT 1509 BY CM (NOTE) If result is NEGATIVE SARS-CoV-2 target nucleic acids are NOT DETECTED. The SARS-CoV-2 RNA is generally detectable in upper and lower  respiratory specimens during the acute phase of infection. The lowest  concentration of SARS-CoV-2 viral copies this assay can detect is 250  copies / mL. A negative result does not preclude SARS-CoV-2 infection  and should not be used as the sole basis for treatment or other  patient management decisions.  A negative result may occur with  improper  specimen collection / handling, submission of specimen other  than nasopharyngeal swab, presence of viral mutation(s) within the  areas targeted by this assay, and inadequate number of viral copies  (<250 copies / mL). A negative result must be combined with clinical  observations, patient history, and epidemiological information. If result is POSITIVE SARS-CoV-2 target nucleic acids are DETECTED. The  SARS-CoV-2 RNA is generally detectable in upper and lower  respiratory specimens during the acute phase of infection.  Positive  results are indicative of active infection with SARS-CoV-2.  Clinical  correlation with patient history and other diagnostic information is  necessary to determine patient infection status.  Positive results do  not rule out bacterial infection or co-infection with other viruses. If result is PRESUMPTIVE POSTIVE SARS-CoV-2 nucleic acids MAY  BE PRESENT.   A presumptive positive result was obtained on the submitted specimen  and confirmed on repeat testing.  While 2019 novel coronavirus  (SARS-CoV-2) nucleic acids may be present in the submitted sample  additional confirmatory testing may be necessary for epidemiological  and / or clinical management purposes  to differentiate between  SARS-CoV-2 and other Sarbecovirus currently known to infect humans.  If clinically indicated additional testing with an alternate test  methodology [phone]) is ad vised. The SARS-CoV-2 RNA is generally  detectable in upper and lower respiratory specimens during the acute  phase of infection. The expected result is Negative. Fact Sheet for Patients:  StrictlyIdeas.no Fact Sheet for Healthcare Providers: BankingDealers.co.za This test is not yet approved or cleared by the [place] FDA and has been authorized for detection and/or diagnosis of SARS-CoV-2 by FDA under an Emergency Use Authorization (EUA).  This EUA will remain in  effect (meaning this test can be used) for the duration of the COVID-19 declaration under Section 564(b)(1) of the Act, 21 U.S.C. section 360bbb-3(b)(1), unless the authorization is terminated or revoked sooner. Performed at [facility], [place] [address], [place],  [address]       Radiology Studies: No results found.  Scheduled Meds: . atorvastatin  40 mg Oral QHS  . budesonide  1 puff Inhalation BID  . docusate sodium  100 mg Oral BID  . enoxaparin (LOVENOX) injection  0.5 mg/kg Subcutaneous Q24H  . erythromycin  1 application Both Eyes QHS  . famotidine  20 mg Oral BID  . feeding supplement (ENSURE ENLIVE)  237 mL Oral TID BM  . fluticasone  2 spray Each Nare Daily  . furosemide  20 mg Oral Daily  . insulin aspart  0-5 Units Subcutaneous QHS  . insulin aspart  0-9 Units Subcutaneous TID WC  . lisinopril  40 mg Oral Daily  . mometasone-formoterol  2 puff Inhalation BID  . montelukast  10 mg Oral QHS  . multivitamin with minerals  1 tablet Oral Daily  . norethindrone  2.5 mg Oral QHS  . olopatadine  1 drop Both Eyes QHS  . polyethylene glycol  17 g Oral Daily  . senna  2 tablet Oral Daily  . vitamin C  1,000 mg Oral Daily  . zinc sulfate  220 mg Oral Daily   Continuous Infusions: . remdesivir 100 mg in NS 250 mL Stopped (03/24/19 0920)     LOS: 7 days   Time spent: 25 minutes.  Patrecia Pour, MD [facility] [email or web address] Password [email or web address] 03/25/2019, 9:41 AM

## 2019-03-25 NOTE — Progress Notes (Signed)
Nutrition Follow-up / Consult  RD working remotely.  DOCUMENTATION CODES:   Morbid obesity  INTERVENTION:    Continue Ensure Enlive po TID, each supplement provides 350 kcal and 20 grams of protein  Continue MVI with minerals daily  Continue Hormel Shake daily with Breakfast which provides 520 kcals and 22 g of protein and Magic cup BID with lunch and dinner, each supplement provides 290 kcal and 9 grams of protein, automatically on meal trays to optimize nutritional intake.    NUTRITION DIAGNOSIS:   Increased nutrient needs related to wound healing as evidenced by estimated needs.  GOAL:   Patient will meet greater than or equal to 90% of their needs  MONITOR:   PO intake, Supplement acceptance, Labs, Weight trends, Skin, I & O's  REASON FOR ASSESSMENT:   Malnutrition Screening Tool    ASSESSMENT:   58 yo female admitted after surgery for a poorly healing infraumbilical surgical wound (ex lap with SBR on 02/02/19). Tested positive for COVID on 10/14; transferred to [facility] on 10/16. PMH includes morbid obesity, HTN, asthma, GERD, CHF.  10/13: Abdominal wound exploration, placement of wound VAC 10/14: COVID test; returned positive on 10/16.   Reviewed meal completion records- meal completion 0-25%. Per MAR, pt is accepting Ensure supplements 2-3 times per day.   Medications reviewed and include Colace, lasix, MVI, Miralax, Senokot, vitamin C, zinc sulfate.  Labs reviewed. Corrected calcium WNL, Hgb 11.4 (L) CBG's: 106-103-113-121 CBG testing being stopped today, given well controlled glucose readings.   Diet Order:   Diet Order            Diet Heart Room service appropriate? No; Fluid consistency: Thin  Diet effective now              EDUCATION NEEDS:   No education needs have been identified at this time  Skin:  Skin Assessment: Skin Integrity Issues: Skin Integrity Issues:: Other (Comment) Wound Vac: abdomen Other: pylondial cyst to mid upper  buttocks  Last BM:  10/19  Height:   Ht Readings from Last 1 Encounters:  03/24/19 5\' 7"  (1.702 m)    Weight:   Wt Readings from Last 1 Encounters:  03/21/19 (!) 156.3 kg    Ideal Body Weight:  54.5 kg  Body mass index is 53.97 kg/m.  Estimated Nutritional Needs:   Kcal:  2200-2500  Protein:  120-135 grams  Fluid:  >/= 2 L    Molli Barrows, RD, LDN, [place] Pager [phone] After Hours Pager [phone]

## 2019-03-26 LAB — CBC WITH DIFFERENTIAL/PLATELET
Abs Immature Granulocytes: 0.02 10*3/uL (ref 0.00–0.07)
Basophils Absolute: 0 10*3/uL (ref 0.0–0.1)
Basophils Relative: 0 %
Eosinophils Absolute: 0 10*3/uL (ref 0.0–0.5)
Eosinophils Relative: 0 %
HCT: 36 % (ref 36.0–46.0)
Hemoglobin: 11.3 g/dL — ABNORMAL LOW (ref 12.0–15.0)
Immature Granulocytes: 0 %
Lymphocytes Relative: 28 %
Lymphs Abs: 1.3 10*3/uL (ref 0.7–4.0)
MCH: 27.8 pg (ref 26.0–34.0)
MCHC: 31.4 g/dL (ref 30.0–36.0)
MCV: 88.5 fL (ref 80.0–100.0)
Monocytes Absolute: 0.4 10*3/uL (ref 0.1–1.0)
Monocytes Relative: 8 %
Neutro Abs: 2.9 10*3/uL (ref 1.7–7.7)
Neutrophils Relative %: 64 %
Platelets: 127 10*3/uL — ABNORMAL LOW (ref 150–400)
RBC: 4.07 MIL/uL (ref 3.87–5.11)
RDW: 14.8 % (ref 11.5–15.5)
WBC: 4.6 10*3/uL (ref 4.0–10.5)
nRBC: 0 % (ref 0.0–0.2)

## 2019-03-26 LAB — TYPE AND SCREEN
ABO/RH(D): O POS
Antibody Screen: NEGATIVE

## 2019-03-26 LAB — COMPREHENSIVE METABOLIC PANEL
ALT: 24 U/L (ref 0–44)
AST: 29 U/L (ref 15–41)
Albumin: 3.1 g/dL — ABNORMAL LOW (ref 3.5–5.0)
Alkaline Phosphatase: 63 U/L (ref 38–126)
Anion gap: 10 (ref 5–15)
BUN: 12 mg/dL (ref 6–20)
CO2: 25 mmol/L (ref 22–32)
Calcium: 8.2 mg/dL — ABNORMAL LOW (ref 8.9–10.3)
Chloride: 100 mmol/L (ref 98–111)
Creatinine, Ser: 0.51 mg/dL (ref 0.44–1.00)
GFR calc Af Amer: >60 mL/min (ref >60)
GFR calc non Af Amer: >60 mL/min (ref >60)
Glucose, Bld: 142 mg/dL — ABNORMAL HIGH (ref 70–99)
Potassium: 4 mmol/L (ref 3.5–5.1)
Sodium: 135 mmol/L (ref 135–145)
Total Bilirubin: 0.7 mg/dL (ref 0.3–1.2)
Total Protein: 6.7 g/dL (ref 6.5–8.1)

## 2019-03-26 LAB — D-DIMER, QUANTITATIVE: D-Dimer, Quant: 0.95 ug/mL-FEU — ABNORMAL HIGH (ref 0.00–0.50)

## 2019-03-26 LAB — C-REACTIVE PROTEIN: CRP: 2.5 mg/dL — ABNORMAL HIGH (ref <1.0)

## 2019-03-26 LAB — GLUCOSE, CAPILLARY
Glucose-Capillary: 91 mg/dL (ref 70–99)
Glucose-Capillary: 135 mg/dL — ABNORMAL HIGH (ref 70–99)
Glucose-Capillary: 91 mg/dL (ref 70–99)

## 2019-03-26 LAB — ABO/RH: ABO/RH(D): O POS

## 2019-03-26 MED ORDER — POLYETHYLENE GLYCOL 3350 17 G PO PACK
17.0000 g | PACK | Freq: Two times a day (BID) | ORAL | Status: DC
  Administered 2019-03-26 – 2019-04-04 (×16): 17 g via ORAL
  Filled 2019-03-26 (×17): qty 1

## 2019-03-26 MED ORDER — SENNA 8.6 MG PO TABS
2.0000 | ORAL_TABLET | Freq: Two times a day (BID) | ORAL | Status: DC
  Administered 2019-03-26 – 2019-04-04 (×17): 17.2 mg via ORAL
  Filled 2019-03-26 (×17): qty 2

## 2019-03-26 MED ORDER — DOCUSATE SODIUM 100 MG PO CAPS
200.0000 mg | ORAL_CAPSULE | Freq: Two times a day (BID) | ORAL | Status: DC
  Administered 2019-03-26 – 2019-04-04 (×16): 200 mg via ORAL
  Filled 2019-03-26 (×16): qty 2

## 2019-03-26 NOTE — Progress Notes (Signed)
PROGRESS NOTE  Kelsey Bullock  [id number] DOB: 1960/09/14 [name]: 03/18/2019 PCP: [name], MD   Brief Narrative: Kelsey Bullock is a 58 y.o. female with a history of morbid obesity who was admitted following surgery for a poorly healing infraumbilical surgical wound. She had initially had an ex lap with small bowel resection for incarcerated ventral hernia on 02/02/2019. Slow wound healing, though discharged with drain removed on 9/8, having her sister perform wet-to-dry dressing changes. Due to continued pain at the site and failure of the wound to close, CT was performed 10/9 and showed 6x4x4cm collection of gas and debris in the subcutaneous fat at the site with deeper collection felt to be an area of infection at the level of the fascia, possibly tracking behind the rectus sheath. She was scheduled for surgery. General surgery, Dr. Georgette Dover, performed wound exploration with wound vac placement (3 x 6 x 12cm deep) on 10/13. Small gaps in the fascial closure were noted and necrotic fascia was sharply debrided prior to vac placement. Though pre-surgical covid testing was negative, this was repeated when her sister was diagnosed with covid-19. SARS-CoV-2 PCR collected 10/14 was positive on 10/16, albeit with no CXR infiltrates or respiratory symptoms. This made discharge with wound vac impossible as home health was reportedly unable to accommodate covid-positive patients and the patient was expected to require at least another week of vac care, so she was transferred to [facility] 10/16.   Assessment & Plan: Principal Problem:   Chronic abdominal wound infection Active Problems:   HYPERCHOLESTEROLEMIA   Morbid obesity ([place])   Anxiety state   HYPERTENSION, BENIGN SYSTEMIC   Asthma   Controlled type 2 diabetes mellitus with neuropathy (HCC)   GERD (gastroesophageal reflux disease)   Diastolic CHF with preserved left ventricular function, NYHA class 2 ([place])   COVID-19 virus infection  Constipation  Chronic abdominal wound infection: Initially had ex lap with small bowel resection for incarcerated ventral hernia on 02/02/2019, now s/p wound exploration with wound vac placement (3 x 6 x 12cm deep) 10/13 by Dr. Georgette Dover.  - Will require ongoing wound vac changes/care prior to discharge. General surgery to follow and, along with [facility] team, care for wound. Wound vac change scheduled MWF per surgery, later today. See pictures of wound in note 10/19. Next reevaluation is 10/21 if needed, otherwise, 10/23.  - Continue analgesia per surgery's orders - Dietitian consulted  Acute hypoxic respiratory failure due to covid-19 pneumonia: Inflammatory marker trending upward, placed on oxygen due to hypoxia. CXR 10/18 personally reviewed showed patchy infiltrates progressing from prior. - Continue remdesivir 10/18 - 10/22 - Continue supplemental oxygen prn to maintain SpO2 >90%. - Having some hypoxia, recommended CCP today, though pt is reluctant. We will recheck labs today instead of tomorrow and include T&S in the event she consents in next 72hrs.  - Will attempt to avoid steroids given problems with wound healing. - Avoid NSAIDs if able - Incentive spirometry encouraged, OOB as able. Stressed need for mobilizing and IS to reduce hypoventilation and atelectasis.  - Continue to trend LFTs, inflammatory markers. D-dimer up, continuing ppx AC, weight-based lovenox prophylaxis (0.5mg /kg [id number]). Check again. If rising despite decreasing CRP or rising precipitously, would need to r/o DVT/PE. - Continue airborne-contact precautions per protocol.  - Continue vitamin C and zinc  T2DM: Last HbA1c (6.1%) shows this is well controlled without medications. CBGs well controlled, not on steroids. No significant insulin requirement, can stop checking.   Constipation: Has hx post-op ileus. -  Continue senna, colace, miralax. Augment each to BID and increase dose. - Continue prn suppository.   Obesity: BMI 59.    - Weight loss recommended, though not at the expense of protein.   Chronic HFpEF: Clinical volume status is difficult, but roughly appears euvolemic and able to take po. Enlarged cardiac silhouette noted on CXR 10/16 on AP projection.  - Continue daily lasix, lisinopril. Metabolic panel stable. - Follow I/O   DUB: Chronic, quiescent.  - Continue norethindrone qHS.  Hyperlipidemia:  - Continue statin  Anxiety:  - Continue home clonazepam  Asthma: No wheezing.  - Continue singulair, dulera, prn albuterol. Also on omalizumab per [facility] not administering while inaptient.  - Avoid nebulized therapy while at high risk of transmission.  - Without wheezing, do not feel steroids would be helpful for this indication.  GERD:  - Continue pepcid  Hemorrhoids:  - Topical therapies. - Sitz baths prn  DVT prophylaxis: Lovenox 80mg  [place] q24h Code Status: Full Family Communication: None at bedside Disposition Plan: Home once wound vac able to be discontinued and remdesivir completed.  Consultants:  General surgery  Procedures: Abdominal wound exploration, placement of wound VAC 3 x 6 x 12 cm deep Surgeon: Maia Petties  Antimicrobials:  Vancomycin x1 10/13   Remdesivir 10/18 - 10/22  Subjective: chart showsd up to 6L yesterday and 3L overnight, though pt doesn't believe this is true, has not had severe shortness of breath but was desaturating overnight and currently on 1L O2. No current dyspnea, no chest pain. Abd pain stable. Bottom is very sore and though having BMs they are not soft/normal. Eating some, not a lot.  Objective: Vitals:   03/25/19 0753 03/25/19 1500 03/25/19 2000 03/26/19 0827  BP: (!) 100/51  (!) 109/56 118/60  Pulse: 72  74 76  Resp: 20 19 (!) 22 18  Temp: 98 F (36.7 C) 98 F (36.7 C) 98.1 F (36.7 C) 98.9 F (37.2 C)  TempSrc: Oral Oral Oral Oral  SpO2: 91%  91% 91%  Weight:      Height:        Intake/Output Summary (Last 24 hours) at 03/26/2019  1056 Last data filed at 03/26/2019 0600 Gross per 24 hour  Intake 600 ml  Output 303 ml  Net 297 ml   Filed Weights   03/19/19 2100 03/21/19 2206  Weight: (!) 153.6 kg (!) 156.3 kg   Gen: 58 y.o. female in no distress Pulm: Nonlabored breathing 1L O2. Distant with some crackles. CV: Regular rate and rhythm. No murmur, rub, or gallop. No pitting dependent edema. GI: Abdomen soft, non-tender aside from mild tenderness around wound, non-distended, with normoactive bowel sounds.  Ext: Warm, no deformities Skin: No new rashes, lesions or ulcers on visualized skin. Infraumbilical wound unchanged Neuro: Alert and oriented. No focal neurological deficits. Psych: Judgement and insight appear normal. Mood euthymic & affect congruent. Behavior is appropriate, pleasant     Data Reviewed: I have personally reviewed following labs and imaging studies  CBC: Recent Labs  Lab 03/21/19 2303 03/23/19 0021 03/25/19 0430  WBC 5.0 4.5 3.7*  NEUTROABS 3.1 2.5 1.7  HGB 11.1* 11.2* 11.4*  HCT 36.0 35.9* 36.1  MCV 90.0 90.0 88.9  PLT 172 159 [id number]*   Basic Metabolic Panel: Recent Labs  Lab 03/21/19 2303 03/23/19 0021 03/25/19 0430  NA 134* 134* 136  K 4.5 4.3 4.0  CL 99 96* 98  CO2 26 27 27   GLUCOSE 108* 124* 107*  BUN 10 12  14  CREATININE 0.61 0.64 0.59  CALCIUM 8.3* 8.5* 8.4*  MG  --  2.0  --   PHOS  --  4.8*  --    GFR: Estimated Creatinine Clearance: 121.9 mL/min (by C-G formula based on SCr of 0.59 mg/dL). Liver Function Tests: Recent Labs  Lab 03/21/19 2303 03/23/19 0021 03/25/19 0430  AST 17 18 24   ALT 18 18 19   ALKPHOS 54 54 56  BILITOT 0.6 0.7 0.9  PROT 6.2* 6.5 6.4*  ALBUMIN 3.0* 3.1* 3.1*   No results for input(s): LIPASE, AMYLASE in the last 168 hours. No results for input(s): AMMONIA in the last 168 hours. Coagulation Profile: No results for input(s): INR, PROTIME in the last 168 hours. Cardiac Enzymes: No results for input(s): CKTOTAL, CKMB, CKMBINDEX,  TROPONINI in the last 168 hours. BNP (last 3 results) No results for input(s): PROBNP in the last 8760 hours. HbA1C: No results for input(s): HGBA1C in the last 72 hours. CBG: Recent Labs  Lab 03/25/19 0724 03/25/19 1131 03/25/19 1611 03/25/19 2235 03/26/19 0825  GLUCAP 112* 121* 137* 127* 91   Lipid Profile: No results for input(s): CHOL, HDL, LDLCALC, TRIG, CHOLHDL, LDLDIRECT in the last 72 hours. Thyroid Function Tests: No results for input(s): TSH, T4TOTAL, FREET4, T3FREE, THYROIDAB in the last 72 hours. Anemia Panel: No results for input(s): VITAMINB12, FOLATE, FERRITIN, TIBC, IRON, RETICCTPCT in the last 72 hours. Urine analysis:    Component Value Date/Time   COLORURINE AMBER (A) 02/06/2019 1550   APPEARANCEUR HAZY (A) 02/06/2019 1550   LABSPEC 1.025 02/06/2019 1550   PHURINE 7.0 02/06/2019 1550   GLUCOSEU NEGATIVE 02/06/2019 1550   HGBUR NEGATIVE 02/06/2019 1550   HGBUR trace-intact 10/12/2008 1058   BILIRUBINUR SMALL (A) 02/06/2019 1550   BILIRUBINUR negative 09/21/2016 0911   KETONESUR 20 (A) 02/06/2019 1550   PROTEINUR 30 (A) 02/06/2019 1550   UROBILINOGEN 0.2 09/21/2016 0911   UROBILINOGEN 0.2 10/12/2008 1058   NITRITE POSITIVE (A) 02/06/2019 1550   LEUKOCYTESUR NEGATIVE 02/06/2019 1550   Recent Results (from the past 240 hour(s))  SARS Coronavirus 2 by RT PCR (hospital order, performed in [place] hospital lab) Nasopharyngeal Nasopharyngeal Swab     Status: Abnormal   Collection Time: 03/19/19 12:21 PM   Specimen: Nasopharyngeal Swab  Result Value Ref Range Status   SARS Coronavirus 2 POSITIVE (A) NEGATIVE Final    Comment: RESULT CALLED TO, READ BACK BY AND VERIFIED WITH: RN T HARVEY [id number] AT 1509 BY CM (NOTE) If result is NEGATIVE SARS-CoV-2 target nucleic acids are NOT DETECTED. The SARS-CoV-2 RNA is generally detectable in upper and lower  respiratory specimens during the acute phase of infection. The lowest  concentration of SARS-CoV-2 viral  copies this assay can detect is 250  copies / mL. A negative result does not preclude SARS-CoV-2 infection  and should not be used as the sole basis for treatment or other  patient management decisions.  A negative result may occur with  improper specimen collection / handling, submission of specimen other  than nasopharyngeal swab, presence of viral mutation(s) within the  areas targeted by this assay, and inadequate number of viral copies  (<250 copies / mL). A negative result must be combined with clinical  observations, patient history, and epidemiological information. If result is POSITIVE SARS-CoV-2 target nucleic acids are DETECTED. The  SARS-CoV-2 RNA is generally detectable in upper and lower  respiratory specimens during the acute phase of infection.  Positive  results are indicative of active infection  with SARS-CoV-2.  Clinical  correlation with patient history and other diagnostic information is  necessary to determine patient infection status.  Positive results do  not rule out bacterial infection or co-infection with other viruses. If result is PRESUMPTIVE POSTIVE SARS-CoV-2 nucleic acids MAY BE PRESENT.   A presumptive positive result was obtained on the submitted specimen  and confirmed on repeat testing.  While 2019 novel coronavirus  (SARS-CoV-2) nucleic acids may be present in the submitted sample  additional confirmatory testing may be necessary for epidemiological  and / or clinical management purposes  to differentiate between  SARS-CoV-2 and other Sarbecovirus currently known to infect humans.  If clinically indicated additional testing with an alternate test  methodology [phone]) is ad vised. The SARS-CoV-2 RNA is generally  detectable in upper and lower respiratory specimens during the acute  phase of infection. The expected result is Negative. Fact Sheet for Patients:  StrictlyIdeas.no Fact Sheet for Healthcare  Providers: BankingDealers.co.za This test is not yet approved or cleared by the [place] FDA and has been authorized for detection and/or diagnosis of SARS-CoV-2 by FDA under an Emergency Use Authorization (EUA).  This EUA will remain in effect (meaning this test can be used) for the duration of the COVID-19 declaration under Section 564(b)(1) of the Act, 21 U.S.C. section 360bbb-3(b)(1), unless the authorization is terminated or revoked sooner. Performed at [facility], [place] [address], [place], [place] [address]       Radiology Studies: No results found.  Scheduled Meds: . atorvastatin  40 mg Oral QHS  . budesonide  1 puff Inhalation BID  . docusate sodium  100 mg Oral BID  . enoxaparin (LOVENOX) injection  0.5 mg/kg Subcutaneous Q24H  . erythromycin  1 application Both Eyes QHS  . famotidine  20 mg Oral BID  . feeding supplement (ENSURE ENLIVE)  237 mL Oral TID BM  . fluticasone  2 spray Each Nare Daily  . furosemide  20 mg Oral Daily  . lisinopril  40 mg Oral Daily  . mometasone-formoterol  2 puff Inhalation BID  . montelukast  10 mg Oral QHS  . multivitamin with minerals  1 tablet Oral Daily  . norethindrone  2.5 mg Oral QHS  . olopatadine  1 drop Both Eyes QHS  . polyethylene glycol  17 g Oral Daily  . senna  2 tablet Oral Daily  . vitamin C  1,000 mg Oral Daily  . zinc sulfate  220 mg Oral Daily   Continuous Infusions: . remdesivir 100 mg in NS 250 mL 100 mg (03/26/19 0926)     LOS: 8 days   Time spent: 25 minutes.  Patrecia Pour, MD [facility] [email or web address] Password [email or web address] 03/26/2019, 10:56 AM

## 2019-03-26 NOTE — Plan of Care (Signed)
Will continue with plan of care. 

## 2019-03-26 NOTE — Progress Notes (Signed)
Wound care done and wound vac changed at this time. Wound look great beefy red. Minimal drainage noted. Patient tolerated well. No acute distress noted.

## 2019-03-27 LAB — GLUCOSE, CAPILLARY: Glucose-Capillary: 98 mg/dL (ref 70–99)

## 2019-03-27 NOTE — Progress Notes (Signed)
PROGRESS NOTE  Kelsey Bullock  [id number] DOB: 08/01/1960 [name]: 03/18/2019 PCP: [name], MD   Brief Narrative: Kelsey Bullock is a 58 y.o. female with a history of morbid obesity who was admitted following surgery for a poorly healing infraumbilical surgical wound. She had initially had an ex lap with small bowel resection for incarcerated ventral hernia on 02/02/2019. Slow wound healing, though discharged with drain removed on 9/8, having her sister perform wet-to-dry dressing changes. Due to continued pain at the site and failure of the wound to close, CT was performed 10/9 and showed 6x4x4cm collection of gas and debris in the subcutaneous fat at the site with deeper collection felt to be an area of infection at the level of the fascia, possibly tracking behind the rectus sheath. She was scheduled for surgery. General surgery, Dr. Georgette Dover, performed wound exploration with wound vac placement (3 x 6 x 12cm deep) on 10/13. Small gaps in the fascial closure were noted and necrotic fascia was sharply debrided prior to vac placement. Though pre-surgical covid testing was negative, this was repeated when her sister was diagnosed with covid-19. SARS-CoV-2 PCR collected 10/14 was positive on 10/16, albeit with no CXR infiltrates or respiratory symptoms. This made discharge with wound vac impossible as home health was reportedly unable to accommodate covid-positive patients and the patient was expected to require at least another week of vac care, so she was transferred to [place] 10/16.   Assessment & Plan: Principal Problem:   Chronic abdominal wound infection Active Problems:   HYPERCHOLESTEROLEMIA   Morbid obesity ([place])   Anxiety state   HYPERTENSION, BENIGN SYSTEMIC   Asthma   Controlled type 2 diabetes mellitus with neuropathy (H[facility])   GERD (gastroesophageal reflux disease)   Diastolic CHF with preserved left ventricular function, NYHA class 2 ([place])   COVID-19 virus infection  Constipation  Chronic abdominal wound infection: Initially had ex lap with small bowel resection for incarcerated ventral hernia on 02/02/2019, now s/p wound exploration with wound vac placement (3 x 6 x 12cm deep) 10/13 by Dr. Georgette Dover.  - Will require ongoing wound vac changes/care prior to discharge. General surgery to follow and, along with [place] team, care for wound. Wound vac change scheduled MWF per surgery, last performed 10/21. See pictures of wound in note 10/19. Next reevaluation is 10/21 if needed, otherwise, 10/23.  - Continue analgesia per surgery's orders - Dietitian consulted  Acute hypoxic respiratory failure due to covid-19 pneumonia: Inflammatory marker trending upward, placed on oxygen due to hypoxia. CXR 10/18 personally reviewed showed patchy infiltrates progressing from prior. Labs 10/21 show decreasing inflammatory markers and hypoxia stable at 1L O2 with normal effort.  - Continue remdesivir 10/18 - 10/22 - Continue supplemental oxygen prn to maintain SpO2 >90%. - Considered CCP 10/21 though labs improved and clinically has stabilized. Will defer this for now.  - Will attempt to avoid steroids given problems with wound healing. - Avoid NSAIDs if able - Incentive spirometry encouraged, OOB as able. Stressed need for mobilizing and IS to reduce hypoventilation and atelectasis.  - Continue to trend LFTs while on remdesivir, tomorrow is last check. - Continue airborne-contact precautions per protocol.  - Continue vitamin C and zinc  T2DM: Last HbA1c (6.1%) shows this is well controlled without medications. CBGs well controlled, not on steroids.  - No significant insulin requirement, can stop checking.   Constipation: Has hx post-op ileus. - Continue senna, colace, miralax. Augment each to BID and increase dose. - Continue prn  suppository.   Obesity: BMI 59.  - Weight loss recommended, though not at the expense of protein.   Chronic HFpEF: Clinical volume status is  difficult, but roughly appears euvolemic and able to take po. Enlarged cardiac silhouette noted on CXR 10/16 on AP projection.  - Continue daily lasix, lisinopril. Metabolic panel stable. - Follow I/O   DUB: Chronic, quiescent.  - Continue norethindrone qHS with lovenox at as below..  Hyperlipidemia:  - Continue statin  Anxiety:  - Continue home clonazepam  Asthma: No wheezing.  - Continue singulair, dulera, prn albuterol. Also on omalizumab per [facility] not administering while inaptient.  - Avoid nebulized therapy while at high risk of transmission.  - Without wheezing, do not feel steroids would be helpful for this indication.  GERD:  - Continue pepcid  Hemorrhoids:  - Topical therapies. - Sitz baths prn  DVT prophylaxis: Lovenox 80mg  [place] q24h Code Status: Full Family Communication: None at bedside Disposition Plan: Home once wound vac able to be discontinued. Remdesivir to be completed today.  Consultants:  General surgery  Procedures: Abdominal wound exploration, placement of wound VAC 3 x 6 x 12 cm deep Surgeon: Imogene Burn Tsuei  Antimicrobials:  Vancomycin x1 10/13   Remdesivir 10/18 - 10/22  Subjective: Some "rattling" in the chest today but no shortness of breath. No chest pain. Pain around site is stable. Worried about her son who is having chest discomfort. I urged her to have him seek medical attention if he's having that with shortness of breath, which she states he is. He's 18. Wound dressing change went ok yesterday. No fevers.   Objective: Vitals:   03/26/19 0827 03/26/19 1600 03/26/19 2130 03/27/19 0732  BP: 118/60 123/67 113/65 127/73  Pulse: 76  75 78  Resp: 18 18 (!) 22 20  Temp: 98.9 F (37.2 C) 97.9 F (36.6 C) 98.6 F (37 C) 98.7 F (37.1 C)  TempSrc: Oral Oral Oral Oral  SpO2: 91%  92% 93%  Weight:      Height:        Intake/Output Summary (Last 24 hours) at 03/27/2019 0840 Last data filed at 03/26/2019 2130 Gross per 24 hour  Intake  740 ml  Output 50 ml  Net 690 ml   Filed Weights   03/19/19 2100 03/21/19 2206  Weight: (!) 153.6 kg (!) 156.3 kg   Gen: 58 y.o. female in no distress Pulm: Nonlabored breathing 1L, normal rate, distant but clear bilaterally. CV: Regular rate and rhythm. No murmur, rub, or gallop. UTD JVD, no pitting dependent edema. GI: Abdomen soft, non-tender, non-distended, with normoactive bowel sounds.  Ext: Warm, no deformities Skin: No new rashes, lesions or ulcers on visualized skin. Infraumbilical wound w/wound vac dressing in place without extending erythema, appropriately tender. Neuro: Alert and oriented. No focal neurological deficits. Psych: Judgement and insight appear fair. Mood euthymic & affect congruent. Behavior is appropriate.    Data Reviewed: I have personally reviewed following labs and imaging studies  CBC: Recent Labs  Lab 03/21/19 2303 03/23/19 0021 03/25/19 0430 03/26/19 1125  WBC 5.0 4.5 3.7* 4.6  NEUTROABS 3.1 2.5 1.7 2.9  HGB 11.1* 11.2* 11.4* 11.3*  HCT 36.0 35.9* 36.1 36.0  MCV 90.0 90.0 88.9 88.5  PLT 172 159 148* [id number]   Basic Metabolic Panel: Recent Labs  Lab 03/21/19 2303 03/23/19 0021 03/25/19 0430 03/26/19 1125  NA 134* 134* 136 135  K 4.5 4.3 4.0 4.0  CL 99 96* 98 100  CO2 26 27  27 25  GLUCOSE 108* 124* 107* 142*  BUN 10 12 14 12   CREATININE 0.61 0.64 0.59 0.51  CALCIUM 8.3* 8.5* 8.4* 8.2*  MG  --  2.0  --   --   PHOS  --  4.8*  --   --    GFR: Estimated Creatinine Clearance: 121.9 mL/min (by C-G formula based on SCr of 0.51 mg/dL). Liver Function Tests: Recent Labs  Lab 03/21/19 2303 03/23/19 0021 03/25/19 0430 03/26/19 1125  AST 17 18 24 29   ALT 18 18 19 24   ALKPHOS 54 54 56 63  BILITOT 0.6 0.7 0.9 0.7  PROT 6.2* 6.5 6.4* 6.7  ALBUMIN 3.0* 3.1* 3.1* 3.1*   No results for input(s): LIPASE, AMYLASE in the last 168 hours. No results for input(s): AMMONIA in the last 168 hours. Coagulation Profile: No results for input(s):  INR, PROTIME in the last 168 hours. Cardiac Enzymes: No results for input(s): CKTOTAL, CKMB, CKMBINDEX, TROPONINI in the last 168 hours. BNP (last 3 results) No results for input(s): PROBNP in the last 8760 hours. HbA1C: No results for input(s): HGBA1C in the last 72 hours. CBG: Recent Labs  Lab 03/25/19 2235 03/26/19 0825 03/26/19 1147 03/26/19 1638 03/26/19 2353  GLUCAP 127* 91 135* 91 98   Lipid Profile: No results for input(s): CHOL, HDL, LDLCALC, TRIG, CHOLHDL, LDLDIRECT in the last 72 hours. Thyroid Function Tests: No results for input(s): TSH, T4TOTAL, FREET4, T3FREE, THYROIDAB in the last 72 hours. Anemia Panel: No results for input(s): VITAMINB12, FOLATE, FERRITIN, TIBC, IRON, RETICCTPCT in the last 72 hours. Urine analysis:    Component Value Date/Time   COLORURINE AMBER (A) 02/06/2019 1550   APPEARANCEUR HAZY (A) 02/06/2019 1550   LABSPEC 1.025 02/06/2019 1550   PHURINE 7.0 02/06/2019 1550   GLUCOSEU NEGATIVE 02/06/2019 1550   HGBUR NEGATIVE 02/06/2019 1550   HGBUR trace-intact 10/12/2008 1058   BILIRUBINUR SMALL (A) 02/06/2019 1550   BILIRUBINUR negative 09/21/2016 0911   KETONESUR 20 (A) 02/06/2019 1550   PROTEINUR 30 (A) 02/06/2019 1550   UROBILINOGEN 0.2 09/21/2016 0911   UROBILINOGEN 0.2 10/12/2008 1058   NITRITE POSITIVE (A) 02/06/2019 1550   LEUKOCYTESUR NEGATIVE 02/06/2019 1550   Recent Results (from the past 240 hour(s))  SARS Coronavirus 2 by RT PCR (hospital order, performed in [facility] hospital lab) Nasopharyngeal Nasopharyngeal Swab     Status: Abnormal   Collection Time: 03/19/19 12:21 PM   Specimen: Nasopharyngeal Swab  Result Value Ref Range Status   SARS Coronavirus 2 POSITIVE (A) NEGATIVE Final    Comment: RESULT CALLED TO, READ BACK BY AND VERIFIED WITH: RN T HARVEY [id number] AT 1509 BY CM (NOTE) If result is NEGATIVE SARS-CoV-2 target nucleic acids are NOT DETECTED. The SARS-CoV-2 RNA is generally detectable in upper and lower    respiratory specimens during the acute phase of infection. The lowest  concentration of SARS-CoV-2 viral copies this assay can detect is 250  copies / mL. A negative result does not preclude SARS-CoV-2 infection  and should not be used as the sole basis for treatment or other  patient management decisions.  A negative result may occur with  improper specimen collection / handling, submission of specimen other  than nasopharyngeal swab, presence of viral mutation(s) within the  areas targeted by this assay, and inadequate number of viral copies  (<250 copies / mL). A negative result must be combined with clinical  observations, patient history, and epidemiological information. If result is POSITIVE SARS-CoV-2 target nucleic acids are  DETECTED. The  SARS-CoV-2 RNA is generally detectable in upper and lower  respiratory specimens during the acute phase of infection.  Positive  results are indicative of active infection with SARS-CoV-2.  Clinical  correlation with patient history and other diagnostic information is  necessary to determine patient infection status.  Positive results do  not rule out bacterial infection or co-infection with other viruses. If result is PRESUMPTIVE POSTIVE SARS-CoV-2 nucleic acids MAY BE PRESENT.   A presumptive positive result was obtained on the submitted specimen  and confirmed on repeat testing.  While 2019 novel coronavirus  (SARS-CoV-2) nucleic acids may be present in the submitted sample  additional confirmatory testing may be necessary for epidemiological  and / or clinical management purposes  to differentiate between  SARS-CoV-2 and other Sarbecovirus currently known to infect humans.  If clinically indicated additional testing with an alternate test  methodology [phone]) is ad vised. The SARS-CoV-2 RNA is generally  detectable in upper and lower respiratory specimens during the acute  phase of infection. The expected result is Negative. Fact  Sheet for Patients:  StrictlyIdeas.no Fact Sheet for Healthcare Providers: BankingDealers.co.za This test is not yet approved or cleared by the [place] FDA and has been authorized for detection and/or diagnosis of SARS-CoV-2 by FDA under an Emergency Use Authorization (EUA).  This EUA will remain in effect (meaning this test can be used) for the duration of the COVID-19 declaration under Section 564(b)(1) of the Act, 21 U.S.C. section 360bbb-3(b)(1), unless the authorization is terminated or revoked sooner. Performed at [facility], [place] [address] [place], [place] [address]       Radiology Studies: No results found.  Scheduled Meds: . atorvastatin  40 mg Oral QHS  . budesonide  1 puff Inhalation BID  . docusate sodium  200 mg Oral BID  . enoxaparin (LOVENOX) injection  0.5 mg/kg Subcutaneous Q24H  . erythromycin  1 application Both Eyes QHS  . famotidine  20 mg Oral BID  . feeding supplement (ENSURE ENLIVE)  237 mL Oral TID BM  . fluticasone  2 spray Each Nare Daily  . furosemide  20 mg Oral Daily  . lisinopril  40 mg Oral Daily  . mometasone-formoterol  2 puff Inhalation BID  . montelukast  10 mg Oral QHS  . multivitamin with minerals  1 tablet Oral Daily  . norethindrone  2.5 mg Oral QHS  . olopatadine  1 drop Both Eyes QHS  . polyethylene glycol  17 g Oral BID  . senna  2 tablet Oral BID  . vitamin C  1,000 mg Oral Daily  . zinc sulfate  220 mg Oral Daily   Continuous Infusions: . remdesivir 100 mg in NS 250 mL 100 mg (03/27/19 0834)     LOS: 9 days   Time spent: 25 minutes.  Patrecia Pour, MD [facility] [email or web address] Password [email or web address] 03/27/2019, 8:40 AM

## 2019-03-28 LAB — COMPREHENSIVE METABOLIC PANEL
ALT: 30 U/L (ref 0–44)
AST: 30 U/L (ref 15–41)
Albumin: 3.3 g/dL — ABNORMAL LOW (ref 3.5–5.0)
Alkaline Phosphatase: 65 U/L (ref 38–126)
Anion gap: 9 (ref 5–15)
BUN: 13 mg/dL (ref 6–20)
CO2: 24 mmol/L (ref 22–32)
Calcium: 8.5 mg/dL — ABNORMAL LOW (ref 8.9–10.3)
Chloride: 103 mmol/L (ref 98–111)
Creatinine, Ser: 0.54 mg/dL (ref 0.44–1.00)
GFR calc Af Amer: >60 mL/min (ref >60)
GFR calc non Af Amer: >60 mL/min (ref >60)
Glucose, Bld: 106 mg/dL — ABNORMAL HIGH (ref 70–99)
Potassium: 3.8 mmol/L (ref 3.5–5.1)
Sodium: 136 mmol/L (ref 135–145)
Total Bilirubin: 1 mg/dL (ref 0.3–1.2)
Total Protein: 6.9 g/dL (ref 6.5–8.1)

## 2019-03-28 LAB — CBC WITH DIFFERENTIAL/PLATELET
Abs Immature Granulocytes: 0.01 10*3/uL (ref 0.00–0.07)
Basophils Absolute: 0 10*3/uL (ref 0.0–0.1)
Basophils Relative: 0 %
Eosinophils Absolute: 0.1 10*3/uL (ref 0.0–0.5)
Eosinophils Relative: 1 %
HCT: 36.2 % (ref 36.0–46.0)
Hemoglobin: 11.3 g/dL — ABNORMAL LOW (ref 12.0–15.0)
Immature Granulocytes: 0 %
Lymphocytes Relative: 37 %
Lymphs Abs: 2.1 10*3/uL (ref 0.7–4.0)
MCH: 27.6 pg (ref 26.0–34.0)
MCHC: 31.2 g/dL (ref 30.0–36.0)
MCV: 88.3 fL (ref 80.0–100.0)
Monocytes Absolute: 0.5 10*3/uL (ref 0.1–1.0)
Monocytes Relative: 8 %
Neutro Abs: 2.9 10*3/uL (ref 1.7–7.7)
Neutrophils Relative %: 54 %
Platelets: 184 10*3/uL (ref 150–400)
RBC: 4.1 MIL/uL (ref 3.87–5.11)
RDW: 14.7 % (ref 11.5–15.5)
WBC: 5.5 10*3/uL (ref 4.0–10.5)
nRBC: 0 % (ref 0.0–0.2)

## 2019-03-28 LAB — C-REACTIVE PROTEIN: CRP: 1.7 mg/dL — ABNORMAL HIGH (ref <1.0)

## 2019-03-28 NOTE — Progress Notes (Signed)
PROGRESS NOTE  Kelsey Bullock  [id number] DOB: 1961-03-13 [name]: 03/18/2019 PCP: [name], MD   Brief Narrative: Kelsey Bullock is a 58 y.o. female with a history of morbid obesity who was admitted following surgery for a poorly healing infraumbilical surgical wound. She had initially had an ex lap with small bowel resection for incarcerated ventral hernia on 02/02/2019. Slow wound healing, though discharged with drain removed on 9/8, having her sister perform wet-to-dry dressing changes. Due to continued pain at the site and failure of the wound to close, CT was performed 10/9 and showed 6x4x4cm collection of gas and debris in the subcutaneous fat at the site with deeper collection felt to be an area of infection at the level of the fascia, possibly tracking behind the rectus sheath. She was scheduled for surgery. General surgery, Dr. Georgette Dover, performed wound exploration with wound vac placement (3 x 6 x 12cm deep) on 10/13. Small gaps in the fascial closure were noted and necrotic fascia was sharply debrided prior to vac placement. Though pre-surgical covid testing was negative, this was repeated when her sister was diagnosed with covid-19. SARS-CoV-2 PCR collected 10/14 was positive on 10/16, albeit with no CXR infiltrates or respiratory symptoms. This made discharge with wound vac impossible as home health was reportedly unable to accommodate covid-positive patients and the patient was expected to require at least another week of vac care, so she was [facility] to [facility] 10/16.   Assessment & Plan: Principal Problem:   Chronic abdominal wound infection Active Problems:   HYPERCHOLESTEROLEMIA   Morbid obesity ([place])   Anxiety state   HYPERTENSION, BENIGN SYSTEMIC   Asthma   Controlled type 2 diabetes mellitus with neuropathy (HCC)   GERD (gastroesophageal reflux disease)   Diastolic CHF with preserved left ventricular function, NYHA class 2 ([place])   COVID-19 virus infection  Constipation  Chronic abdominal wound infection: Initially had ex lap with small bowel resection for incarcerated ventral hernia on 02/02/2019, now s/p wound exploration with wound vac placement (3 x 6 x 12cm deep) 10/13 by Dr. Georgette Dover.  - Will require ongoing wound vac changes/care prior to discharge. Unable to DC to SNF due to pt unable to give up check. General surgery to follow and, along with [place] team, care for wound. Wound vac change scheduled MWF per surgery, last performed 10/21. See pictures of wound in note 10/19. Next reevaluation is 10/23 if needed, otherwise, 10/23.  - Continue analgesia per surgery's orders - Dietitian consulted  Acute hypoxic respiratory failure due to covid-19 pneumonia: Inflammatory marker trending upward, placed on oxygen due to hypoxia. CXR 10/18 personally reviewed showed patchy infiltrates progressing from prior. Labs 10/21 show decreasing inflammatory markers and hypoxia stable at 1L O2 with normal effort.  - Completed remdesivir 10/18 - 10/22 - Continue supplemental oxygen prn to maintain SpO2 >90%. - Considered CCP though pt was reluctant, labs improved and clinically has stabilized. Will defer this for now.  - Will attempt to avoid steroids given problems with wound healing. - Avoid NSAIDs if able - Incentive spirometry encouraged, OOB as able. Stressed need for mobilizing and IS to reduce hypoventilation and atelectasis.  - Continue airborne-contact precautions per protocol.  - Continue vitamin C and zinc  T2DM: Last HbA1c (6.1%) shows this is well controlled without medications. CBGs well controlled, not on steroids.  - No significant insulin requirement, can stop checking.   Constipation: Has hx post-op ileus. - Continue senna, colace, miralax at high doses. - Continue prn suppository.  Obesity: BMI 59.  - Weight loss recommended, though not at the expense of protein.   Chronic HFpEF: Clinical volume status is difficult, but roughly appears  euvolemic and able to take po. Enlarged cardiac silhouette noted on CXR 10/16 on AP projection.  - Continue daily lasix, lisinopril. Metabolic panel stable. - Follow I/O   DUB: Chronic, quiescent.  - Continue norethindrone qHS with lovenox at as below..  Hyperlipidemia:  - Continue statin  Anxiety:  - Continue home clonazepam  Asthma: No wheezing.  - Continue singulair, dulera, prn albuterol. Also on omalizumab per [facility] not administering while inaptient.  - Avoid nebulized therapy while at high risk of transmission.  - Without wheezing, do not feel steroids would be helpful for this indication.  GERD:  - Continue pepcid  Hemorrhoids:  - Topical therapies. - Sitz baths prn  DVT prophylaxis: Lovenox 80mg  [place] q24h Code Status: Full Family Communication: None at bedside Disposition Plan: Home once wound vac able to be discontinued. May still require supplemental oxygen at this time.  Consultants:  General surgery  Procedures: Abdominal wound exploration, placement of wound VAC 3 x 6 x 12 cm deep Surgeon: Imogene Burn Tsuei  Antimicrobials:  Vancomycin x1 10/13   Remdesivir 10/18 - 10/22  Subjective: Feels breathing is better today, was taken off oxygen this morning and has no dyspnea at rest. No chest pain or other new complaints. Eating ok, having BMs.   Objective: Vitals:   03/27/19 1916 03/28/19 0400 03/28/19 0442 03/28/19 0732  BP: 102/61  114/70 110/64  Pulse: 82 71 73 70  Resp: 20 17 18 16   Temp: 98.1 F (36.7 C)  98.1 F (36.7 C) 98.6 F (37 C)  TempSrc: Oral  Oral Oral  SpO2: 90% 93% 94% 96%  Weight:      Height:        Intake/Output Summary (Last 24 hours) at 03/28/2019 0855 Last data filed at 03/28/2019 0446 Gross per 24 hour  Intake 920 ml  Output --  Net 920 ml   Filed Weights   03/19/19 2100 03/21/19 2206  Weight: (!) 153.6 kg (!) 156.3 kg   Gen: Pleasant, obese female in no distress Pulm: Nonlabored breathing room air, SpO2 88-91%. Clear  anteriorly. CV: Regular rate and rhythm. No murmur, rub, or gallop. UTD JVP, no pitting dependent edema. GI: Abdomen soft, obese, periwound tenderness otherwise non-tender, non-distended, with normoactive bowel sounds.  Ext: Warm, no deformities Skin: No new rashes, lesions or ulcers on visualized skin. Infraumbilical wound without surrounding erythema, vac without leak. Neuro: Alert and oriented. No focal neurological deficits. Psych: Judgement and insight appear fair. Mood euthymic & affect congruent. Behavior is appropriate.     Data Reviewed: I have personally reviewed following labs and imaging studies  CBC: Recent Labs  Lab 03/21/19 2303 03/23/19 0021 03/25/19 0430 03/26/19 1125 03/28/19 0037  WBC 5.0 4.5 3.7* 4.6 5.5  NEUTROABS 3.1 2.5 1.7 2.9 2.9  HGB 11.1* 11.2* 11.4* 11.3* 11.3*  HCT 36.0 35.9* 36.1 36.0 36.2  MCV 90.0 90.0 88.9 88.5 88.3  PLT 172 159 148* 127* [id number]   Basic Metabolic Panel: Recent Labs  Lab 03/21/19 2303 03/23/19 0021 03/25/19 0430 03/26/19 1125 03/28/19 0037  NA 134* 134* 136 135 136  K 4.5 4.3 4.0 4.0 3.8  CL 99 96* 98 100 103  CO2 26 27 27 25 24   GLUCOSE 108* 124* 107* 142* 106*  BUN 10 12 14 12 13   CREATININE 0.61 0.64 0.59 0.51 0.54  CALCIUM 8.3* 8.5* 8.4* 8.2* 8.5*  MG  --  2.0  --   --   --   PHOS  --  4.8*  --   --   --    GFR: Estimated Creatinine Clearance: 121.9 mL/min (by C-G formula based on SCr of 0.54 mg/dL). Liver Function Tests: Recent Labs  Lab 03/21/19 2303 03/23/19 0021 03/25/19 0430 03/26/19 1125 03/28/19 0037  AST 17 18 24 29 30   ALT 18 18 19 24 30   ALKPHOS 54 54 56 63 65  BILITOT 0.6 0.7 0.9 0.7 1.0  PROT 6.2* 6.5 6.4* 6.7 6.9  ALBUMIN 3.0* 3.1* 3.1* 3.1* 3.3*   Urine analysis:    Component Value Date/Time   COLORURINE AMBER (A) 02/06/2019 1550   APPEARANCEUR HAZY (A) 02/06/2019 1550   LABSPEC 1.025 02/06/2019 1550   PHURINE 7.0 02/06/2019 1550   GLUCOSEU NEGATIVE 02/06/2019 1550   HGBUR NEGATIVE  02/06/2019 1550   HGBUR trace-intact 10/12/2008 1058   BILIRUBINUR SMALL (A) 02/06/2019 1550   BILIRUBINUR negative 09/21/2016 0911   KETONESUR 20 (A) 02/06/2019 1550   PROTEINUR 30 (A) 02/06/2019 1550   UROBILINOGEN 0.2 09/21/2016 0911   UROBILINOGEN 0.2 10/12/2008 1058   NITRITE POSITIVE (A) 02/06/2019 1550   LEUKOCYTESUR NEGATIVE 02/06/2019 1550   Recent Results (from the past 240 hour(s))  SARS Coronavirus 2 by RT PCR (hospital order, performed in [place] hospital lab) Nasopharyngeal Nasopharyngeal Swab     Status: Abnormal   Collection Time: 03/19/19 12:21 PM   Specimen: Nasopharyngeal Swab  Result Value Ref Range Status   SARS Coronavirus 2 POSITIVE (A) NEGATIVE Final    Comment: RESULT CALLED TO, READ BACK BY AND VERIFIED WITH: RN T HARVEY [id number] AT 1509 BY CM (NOTE) If result is NEGATIVE SARS-CoV-2 target nucleic acids are NOT DETECTED. The SARS-CoV-2 RNA is generally detectable in upper and lower  respiratory specimens during the acute phase of infection. The lowest  concentration of SARS-CoV-2 viral copies this assay can detect is 250  copies / mL. A negative result does not preclude SARS-CoV-2 infection  and should not be used as the sole basis for treatment or other  patient management decisions.  A negative result may occur with  improper specimen collection / handling, submission of specimen other  than nasopharyngeal swab, presence of viral mutation(s) within the  areas targeted by this assay, and inadequate number of viral copies  (<250 copies / mL). A negative result must be combined with clinical  observations, patient history, and epidemiological information. If result is POSITIVE SARS-CoV-2 target nucleic acids are DETECTED. The  SARS-CoV-2 RNA is generally detectable in upper and lower  respiratory specimens during the acute phase of infection.  Positive  results are indicative of active infection with SARS-CoV-2.  Clinical  correlation with patient  history and other diagnostic information is  necessary to determine patient infection status.  Positive results do  not rule out bacterial infection or co-infection with other viruses. If result is PRESUMPTIVE POSTIVE SARS-CoV-2 nucleic acids MAY BE PRESENT.   A presumptive positive result was obtained on the submitted specimen  and confirmed on repeat testing.  While 2019 novel coronavirus  (SARS-CoV-2) nucleic acids may be present in the submitted sample  additional confirmatory testing may be necessary for epidemiological  and / or clinical management purposes  to differentiate between  SARS-CoV-2 and other Sarbecovirus currently known to infect humans.  If clinically indicated additional testing with an alternate test  methodology [phone]) is ad  vised. The SARS-CoV-2 RNA is generally  detectable in upper and lower respiratory specimens during the acute  phase of infection. The expected result is Negative. Fact Sheet for Patients:  StrictlyIdeas.no Fact Sheet for Healthcare Providers: BankingDealers.co.za This test is not yet approved or cleared by the [place] FDA and has been authorized for detection and/or diagnosis of SARS-CoV-2 by FDA under an Emergency Use Authorization (EUA).  This EUA will remain in effect (meaning this test can be used) for the duration of the COVID-19 declaration under Section 564(b)(1) of the Act, 21 U.S.C. section 360bbb-3(b)(1), unless the authorization is terminated or revoked sooner. Performed at [facility], [place] [address], [place], [place] [address]       Radiology Studies: No results found.  Scheduled Meds: . atorvastatin  40 mg Oral QHS  . budesonide  1 puff Inhalation BID  . docusate sodium  200 mg Oral BID  . enoxaparin (LOVENOX) injection  0.5 mg/kg Subcutaneous Q24H  . erythromycin  1 application Both Eyes QHS  . famotidine  20 mg Oral BID  . feeding supplement (ENSURE  ENLIVE)  237 mL Oral TID BM  . fluticasone  2 spray Each Nare Daily  . furosemide  20 mg Oral Daily  . lisinopril  40 mg Oral Daily  . mometasone-formoterol  2 puff Inhalation BID  . montelukast  10 mg Oral QHS  . multivitamin with minerals  1 tablet Oral Daily  . norethindrone  2.5 mg Oral QHS  . olopatadine  1 drop Both Eyes QHS  . polyethylene glycol  17 g Oral BID  . senna  2 tablet Oral BID  . vitamin C  1,000 mg Oral Daily  . zinc sulfate  220 mg Oral Daily   Continuous Infusions:    LOS: 10 days   Time spent: 25 minutes.  Patrecia Pour, MD [facility] [email or web address] Password [email or web address] 03/28/2019, 8:55 AM

## 2019-03-28 NOTE — Plan of Care (Signed)
Pt slept well during the night. No complaints of pain verbalized. Alert and oriented. Vitals stable on 1L O2 [place]. Mild dyspnea exertion and productive cough noted. Standby assist with  ADLs and ambulation to the bathrrom. Skin assessed, wounds vac worked well overnight, maintained at -125 suction. Interdry on pannus and breasts folds intact.IV SL. Unable to tolerate  prone positioning overnight, IS exercises continued. No other issues, will monitor.   Problem: Education: Goal: Knowledge of General Education information will improve Description: Including pain rating scale, medication(s)/side effects and non-pharmacologic comfort measures Outcome: Progressing   Problem: Health Behavior/Discharge Planning: Goal: Ability to manage health-related needs will improve Outcome: Progressing   Problem: Clinical Measurements: Goal: Ability to maintain clinical measurements within normal limits will improve Outcome: Progressing Goal: Will remain free from infection Outcome: Progressing Goal: Diagnostic test results will improve Outcome: Progressing Goal: Respiratory complications will improve Outcome: Progressing Goal: Cardiovascular complication will be avoided Outcome: Progressing   Problem: Activity: Goal: Risk for activity intolerance will decrease Outcome: Progressing   Problem: Nutrition: Goal: Adequate nutrition will be maintained Outcome: Progressing   Problem: Coping: Goal: Level of anxiety will decrease Outcome: Progressing   Problem: Elimination: Goal: Will not experience complications related to bowel motility Outcome: Progressing Goal: Will not experience complications related to urinary retention Outcome: Progressing   Problem: Pain Managment: Goal: General experience of comfort will improve Outcome: Progressing   Problem: Safety: Goal: Ability to remain free from injury will improve Outcome: Progressing   Problem: Skin Integrity: Goal: Risk for impaired skin  integrity will decrease Outcome: Progressing   Problem: Education: Goal: Knowledge of risk factors and measures for prevention of condition will improve Outcome: Progressing   Problem: Coping: Goal: Psychosocial and spiritual needs will be supported Outcome: Progressing   Problem: Respiratory: Goal: Will maintain a patent airway Outcome: Progressing Goal: Complications related to the disease process, condition or treatment will be avoided or minimized Outcome: Progressing

## 2019-03-28 NOTE — Progress Notes (Signed)
[facility] Progress Note  10 Days Post-Op  Subjective: CC-  Overall doing well. States that abdominal wound is a little less painful. Tolerating vac changes well.  Objective: Vital signs in last 24 hours: Temp:  [98.1 F (36.7 C)-100.4 F (38 C)] 98.6 F (37 C) (10/23 0732) Pulse Rate:  [70-82] 70 (10/23 0732) Resp:  [16-20] 16 (10/23 0732) BP: (102-114)/(57-70) 110/64 (10/23 0732) SpO2:  [90 %-96 %] 96 % (10/23 0732) Last BM Date: 03/27/19  Intake/Output from previous day: 10/22 0701 - 10/23 0700 In: 920 [P.O.:920] Out: -  Intake/Output this shift: No intake/output data recorded.  PE: Gen: alert, NAD Pulm: rate and effort normal on RA Skin: warm and dry Abd: soft, obese, mild tenderness over wound otherwise nontender. Wound beefy red without erythema or drainage, measures 6x3x3.5cm with 6cm tunneling proximally  Lab Results:  Recent Labs    03/26/19 1125 03/28/19 0037  WBC 4.6 5.5  HGB 11.3* 11.3*  HCT 36.0 36.2  PLT 127* 184   BMET Recent Labs    03/26/19 1125 03/28/19 0037  NA 135 136  K 4.0 3.8  CL 100 103  CO2 25 24  GLUCOSE 142* 106*  BUN 12 13  CREATININE 0.51 0.54  CALCIUM 8.2* 8.5*   PT/INR No results for input(s): LABPROT, INR in the last 72 hours. CMP     Component Value Date/Time   NA 136 03/28/2019 0037   NA 142 02/27/2019 1314   K 3.8 03/28/2019 0037   CL 103 03/28/2019 0037   CO2 24 03/28/2019 0037   GLUCOSE 106 (H) 03/28/2019 0037   BUN 13 03/28/2019 0037   BUN 10 02/27/2019 1314   CREATININE 0.54 03/28/2019 0037   CREATININE 0.64 08/26/2015 0900   CALCIUM 8.5 (L) 03/28/2019 0037   PROT 6.9 03/28/2019 0037   PROT 6.8 02/27/2019 1314   ALBUMIN 3.3 (L) 03/28/2019 0037   ALBUMIN 3.9 02/27/2019 1314   AST 30 03/28/2019 0037   ALT 30 03/28/2019 0037   ALKPHOS 65 03/28/2019 0037   BILITOT 1.0 03/28/2019 0037   BILITOT 0.8 02/27/2019 1314   GFRNONAA >60 03/28/2019 0037   GFRNONAA >89 08/26/2015 0900   GFRAA >60  03/28/2019 0037   GFRAA >89 08/26/2015 0900   Lipase     Component Value Date/Time   LIPASE 22 02/02/2019 1810       Studies/Results: No results found.  Anti-infectives: Anti-infectives (From admission, onward)   Start     Dose/Rate Route Frequency Ordered Stop   03/24/19 1000  remdesivir 100 mg in sodium chloride 0.9 % 250 mL IVPB     100 mg 500 mL/hr over 30 Minutes Intravenous Every 24 hours 03/23/19 0916 03/27/19 1446   03/23/19 1100  remdesivir 200 mg in sodium chloride 0.9 % 250 mL IVPB     200 mg 500 mL/hr over 30 Minutes Intravenous Once 03/23/19 0916 03/23/19 1310   03/18/19 1215  fluconazole (DIFLUCAN) tablet 100 mg  Status:  Discontinued     100 mg Oral Daily 03/18/19 1202 03/18/19 1241   03/18/19 0400  vancomycin (VANCOCIN) 1,500 mg in sodium chloride 0.9 % 500 mL IVPB     1,500 mg 250 mL/hr over 120 Minutes Intravenous On call to O.R. 03/17/19 0703 03/18/19 1054       Assessment/Plan OSA  HTN Asthma Anxiety  CHF  COVID-19 infection  S/P ex lap, SBR and primary repair of ventral hernia 02/03/2019 Wound infection after surgery  - S/PAbdominal wound exploration,  placement of wound VAC 3 x 6 x 12 cm deep, Dr. Georgette Dover, 10/13 - POD#10 - Wound healing well and gradually getting smaller, although still with some significant tunneling proximally. Recommend continuing wound vac changes MWF. Hopefully will be able to transition to wet to dry dressing changes next week.   [place] diet VTE: SCD's, lovenox [id number] & Diflucan once, remdesivir 10/19>>10/22 Foley:none Follow up:TBD   LOS: 10 days    Wellington Hampshire, [facility] 03/28/2019, 12:52 PM Please see Amion for pager number during day hours 7:00am-4:30pm

## 2019-03-29 NOTE — Progress Notes (Signed)
PROGRESS NOTE  Kelsey Bullock [id number] DOB: [birth year] [name]: 03/18/2019 PCP: [name], MD   LOS: 11 days   Brief Narrative / Interim history: Kelsey Bullock is a 58 y.o. female with a history of morbid obesity who was admitted following surgery for a poorly healing infraumbilical surgical wound. She had initially had an ex lap with small bowel resection for incarcerated ventral hernia on 02/02/2019. Slow wound healing, though discharged with drain removed on 9/8, having her sister perform wet-to-dry dressing changes. Due to continued pain at the site and failure of the wound to close, CT was performed 10/9 and showed 6x4x4cm collection of gas and debris in the subcutaneous fat at the site with deeper collection felt to be an area of infection at the level of the fascia, possibly tracking behind the rectus sheath. She was scheduled for surgery. General surgery, Dr. Georgette Dover, performed wound exploration with wound vac placement (3 x 6 x 12cm deep) on 10/13. Small gaps in the fascial closure were noted and necrotic fascia was sharply debrided prior to vac placement. Though pre-surgical covid testing was negative, this was repeated when her sister was diagnosed with covid-19. SARS-CoV-2 PCR collected 10/14 was positive on 10/16, albeit with no CXR infiltrates or respiratory symptoms. This made discharge with wound vac impossible as home health was reportedly unable to accommodate covid-positive patients and the patient was expected to require at least another week of vac care, so she was transferred to [facility] 10/16.  General surgery following, potential plans for wound VAC removal early next week  Subjective / 24h Interval events: -no complaints this morning, no abdominal pain, no nausea or vomiting.  No shortness of breath.  Assessment & Plan: Principal Problem:   Chronic abdominal wound infection Active Problems:   HYPERCHOLESTEROLEMIA   Morbid obesity ([place])   Anxiety state  HYPERTENSION, BENIGN SYSTEMIC   Asthma   Controlled type 2 diabetes mellitus with neuropathy (HCC)   GERD (gastroesophageal reflux disease)   Diastolic CHF with preserved left ventricular function, NYHA class 2 ([place])   COVID-19 virus infection   Constipation   Principal Problem Chronic abdominal wound infection  -Initially had ex lap with small bowel resection for incarcerated ventral hernia on 02/02/2019, now s/p wound exploration with wound vac placement (3 x 6 x 12cm deep) 10/13 by Dr. Georgette Dover.  -Will require ongoing wound vac changes/care prior to discharge. Unable to DC to SNF due to pt unable to give up check. General surgery to follow and, along with [place] team, care for wound. Wound vac change scheduled MWF per surgery, last performed 10/21. See pictures of wound in note 10/19. Next reevaluation is 10/26 -Continue analgesia per surgery's orders  Active Problems Acute hypoxic respiratory failure due to covid-19 pneumonia  -decreasing inflammatory markers and hypoxia stable at 1L O2 with normal effort.  -Completed remdesivir 10/18 - 10/22 -Continue supplemental oxygen prn to maintain SpO2 >90%. -Will attempt to avoid steroids given problems with wound healing. -Avoid NSAIDs if able -Incentive spirometry encouraged, OOB as able. Stressed need for mobilizing and IS to reduce hypoventilation and atelectasis.  COVID-19 Labs  Recent Labs    03/26/19 1125 03/28/19 0037  DDIMER 0.95*  --   CRP 2.5* 1.7*    Lab Results  Component Value Date   SARSCOV2NAA POSITIVE (A) 03/19/2019   SARSCOV2NAA NOT DETECTED 03/14/2019   [place] NEGATIVE 02/02/2019   T2DM -Last HbA1c (6.1%) shows this is well controlled without medications. CBGs well controlled, not on steroids.  -  No significant insulin requirement, can stop checking.   Constipation  -bas hx post-op ileus. Bowel regimen  Obesity  -BMI 59  Chronic HFpEF  -Clinical volume status is difficult, but roughly appears euvolemic  and able to take po. Enlarged cardiac silhouette noted on CXR 10/16 on AP projection.  -Continue daily lasix, lisinopril. Metabolic panel stable. -Follow I/O   DUB -Chronic, quiescent.  -Continue norethindrone qHS with lovenox at as below..  Hyperlipidemia  -Continue statin  Anxiety -Continue home clonazepam  Asthma -Continue singulair, dulera, prn albuterol. Also on omalizumab per [facility] not administering while inaptient.  -Avoid nebulized therapy while at high risk of transmission.  -Without wheezing, do not feel steroids would be helpful for this indication.  GERD -Continue pepcid  Hemorrhoids -Topical therapies. -Sitz baths prn  Scheduled Meds: . atorvastatin  40 mg Oral QHS  . budesonide  1 puff Inhalation BID  . docusate sodium  200 mg Oral BID  . enoxaparin (LOVENOX) injection  0.5 mg/kg Subcutaneous Q24H  . erythromycin  1 application Both Eyes QHS  . famotidine  20 mg Oral BID  . feeding supplement (ENSURE ENLIVE)  237 mL Oral TID BM  . fluticasone  2 spray Each Nare Daily  . furosemide  20 mg Oral Daily  . lisinopril  40 mg Oral Daily  . mometasone-formoterol  2 puff Inhalation BID  . montelukast  10 mg Oral QHS  . multivitamin with minerals  1 tablet Oral Daily  . norethindrone  2.5 mg Oral QHS  . olopatadine  1 drop Both Eyes QHS  . polyethylene glycol  17 g Oral BID  . senna  2 tablet Oral BID  . vitamin C  1,000 mg Oral Daily  . zinc sulfate  220 mg Oral Daily   Continuous Infusions: PRN Meds:.acetaminophen **OR** acetaminophen, albuterol, bisacodyl, clonazePAM, cyclobenzaprine, diphenhydrAMINE **OR** diphenhydrAMINE, hydrocortisone, morphine injection, nystatin, ondansetron **OR** ondansetron (ZOFRAN) IV, oxyCODONE, oxymetazoline, traMADol, witch hazel-glycerin  DVT prophylaxis: Lovenox Code Status: Full code Family Communication: d/w patient  Disposition Plan: home when ready   Consultants:  General surgery   Procedures:  Abdominal wound  exploration, placement of wound VAC 3 x 6 x 12 cm deep Surgeon: Maia Petties  Microbiology  None  Antimicrobials: Vanc x 1 10/13 Remdesivir 10/18 >> 10/22    Objective: Vitals:   03/28/19 1615 03/28/19 1945 03/29/19 0300 03/29/19 0830  BP: 115/72 111/63 127/71 113/67  Pulse: 74   77  Resp:  18 16 16   Temp: 98.6 F (37 C) 99.1 F (37.3 C) 98.3 F (36.8 C) 98.1 F (36.7 C)  TempSrc: Oral Oral Oral Oral  SpO2: 94%  91% 91%  Weight:      Height:        Intake/Output Summary (Last 24 hours) at 03/29/2019 1024 Last data filed at 03/28/2019 1842 Gross per 24 hour  Intake 360 ml  Output --  Net 360 ml   Filed Weights   03/19/19 2100 03/21/19 2206  Weight: (!) 153.6 kg (!) 156.3 kg    Examination:  Constitutional: NAD Respiratory: clear to auscultation bilaterally, no wheezing, no crackles. Normal respiratory effort.  Cardiovascular: Regular rate and rhythm, no murmurs / rubs / gallops. No LE edema Abdomen: no tenderness. Bowel sounds positive.   Data Reviewed: I have independently reviewed following labs and imaging studies   CBC: Recent Labs  Lab 03/23/19 0021 03/25/19 0430 03/26/19 1125 03/28/19 0037  WBC 4.5 3.7* 4.6 5.5  NEUTROABS 2.5 1.7 2.9 2.9  HGB 11.2* 11.4* 11.3* 11.3*  HCT 35.9* 36.1 36.0 36.2  MCV 90.0 88.9 88.5 88.3  PLT 159 148* 127* [id number]   Basic Metabolic Panel: Recent Labs  Lab 03/23/19 0021 03/25/19 0430 03/26/19 1125 03/28/19 0037  NA 134* 136 135 136  K 4.3 4.0 4.0 3.8  CL 96* 98 100 103  CO2 27 27 25 24   GLUCOSE 124* 107* 142* 106*  BUN 12 14 12 13   CREATININE 0.64 0.59 0.51 0.54  CALCIUM 8.5* 8.4* 8.2* 8.5*  MG 2.0  --   --   --   PHOS 4.8*  --   --   --    GFR: Estimated Creatinine Clearance: 121.9 mL/min (by C-G formula based on SCr of 0.54 mg/dL). Liver Function Tests: Recent Labs  Lab 03/23/19 0021 03/25/19 0430 03/26/19 1125 03/28/19 0037  AST 18 24 29 30   ALT 18 19 24 30   ALKPHOS 54 56 63 65  BILITOT  0.7 0.9 0.7 1.0  PROT 6.5 6.4* 6.7 6.9  ALBUMIN 3.1* 3.1* 3.1* 3.3*   No results for input(s): LIPASE, AMYLASE in the last 168 hours. No results for input(s): AMMONIA in the last 168 hours. Coagulation Profile: No results for input(s): INR, PROTIME in the last 168 hours. Cardiac Enzymes: No results for input(s): CKTOTAL, CKMB, CKMBINDEX, TROPONINI in the last 168 hours. BNP (last 3 results) No results for input(s): PROBNP in the last 8760 hours. HbA1C: No results for input(s): HGBA1C in the last 72 hours. CBG: Recent Labs  Lab 03/25/19 2235 03/26/19 0825 03/26/19 1147 03/26/19 1638 03/26/19 2353  GLUCAP 127* 91 135* 91 98   Lipid Profile: No results for input(s): CHOL, HDL, LDLCALC, TRIG, CHOLHDL, LDLDIRECT in the last 72 hours. Thyroid Function Tests: No results for input(s): TSH, T4TOTAL, FREET4, T3FREE, THYROIDAB in the last 72 hours. Anemia Panel: No results for input(s): VITAMINB12, FOLATE, FERRITIN, TIBC, IRON, RETICCTPCT in the last 72 hours. Urine analysis:    Component Value Date/Time   COLORURINE AMBER (A) 02/06/2019 1550   APPEARANCEUR HAZY (A) 02/06/2019 1550   LABSPEC 1.025 02/06/2019 1550   PHURINE 7.0 02/06/2019 1550   GLUCOSEU NEGATIVE 02/06/2019 1550   HGBUR NEGATIVE 02/06/2019 1550   HGBUR trace-intact 10/12/2008 1058   BILIRUBINUR SMALL (A) 02/06/2019 1550   BILIRUBINUR negative 09/21/2016 0911   KETONESUR 20 (A) 02/06/2019 1550   PROTEINUR 30 (A) 02/06/2019 1550   UROBILINOGEN 0.2 09/21/2016 0911   UROBILINOGEN 0.2 10/12/2008 1058   NITRITE POSITIVE (A) 02/06/2019 1550   LEUKOCYTESUR NEGATIVE 02/06/2019 1550   Sepsis Labs: Invalid input(s): PROCALCITONIN, LACTICIDVEN  Recent Results (from the past 240 hour(s))  SARS Coronavirus 2 by RT PCR (hospital order, performed in [facility] hospital lab) Nasopharyngeal Nasopharyngeal Swab     Status: Abnormal   Collection Time: 03/19/19 12:21 PM   Specimen: Nasopharyngeal Swab  Result Value Ref Range  Status   SARS Coronavirus 2 POSITIVE (A) NEGATIVE Final    Comment: RESULT CALLED TO, READ BACK BY AND VERIFIED WITH: RN T HARVEY [id number] AT [id number] BY CM (NOTE) If result is NEGATIVE SARS-CoV-2 target nucleic acids are NOT DETECTED. The SARS-CoV-2 RNA is generally detectable in upper and lower  respiratory specimens during the acute phase of infection. The lowest  concentration of SARS-CoV-2 viral copies this assay can detect is 250  copies / mL. A negative result does not preclude SARS-CoV-2 infection  and should not be used as the sole basis for treatment or other  patient management decisions.  A negative result may occur with  improper specimen collection / handling, submission of specimen other  than nasopharyngeal swab, presence of viral mutation(s) within the  areas targeted by this assay, and inadequate number of viral copies  (<250 copies / mL). A negative result must be combined with clinical  observations, patient history, and epidemiological information. If result is POSITIVE SARS-CoV-2 target nucleic acids are DETECTED. The  SARS-CoV-2 RNA is generally detectable in upper and lower  respiratory specimens during the acute phase of infection.  Positive  results are indicative of active infection with SARS-CoV-2.  Clinical  correlation with patient history and other diagnostic information is  necessary to determine patient infection status.  Positive results do  not rule out bacterial infection or co-infection with other viruses. If result is PRESUMPTIVE POSTIVE SARS-CoV-2 nucleic acids MAY BE PRESENT.   A presumptive positive result was obtained on the submitted specimen  and confirmed on repeat testing.  While 2019 novel coronavirus  (SARS-CoV-2) nucleic acids may be present in the submitted sample  additional confirmatory testing may be necessary for epidemiological  and / or clinical management purposes  to differentiate between  SARS-CoV-2 and other Sarbecovirus  currently known to infect humans.  If clinically indicated additional testing with an alternate test  methodology [phone]) is ad vised. The SARS-CoV-2 RNA is generally  detectable in upper and lower respiratory specimens during the acute  phase of infection. The expected result is Negative. Fact Sheet for Patients:  StrictlyIdeas.no Fact Sheet for Healthcare Providers: BankingDealers.co.za This test is not yet approved or cleared by the [place] FDA and has been authorized for detection and/or diagnosis of SARS-CoV-2 by FDA under an Emergency Use Authorization (EUA).  This EUA will remain in effect (meaning this test can be used) for the duration of the COVID-19 declaration under Section 564(b)(1) of the Act, 21 U.S.C. section 360bbb-3(b)(1), unless the authorization is terminated or revoked sooner. Performed at [facility], [place] [address], [place], [place] [address]       Radiology Studies: No results found.  Marzetta Board, MD, PhD [facility]  Contact via  [email or web address]  [place] P: [phone] F: [phone]

## 2019-03-30 LAB — COMPREHENSIVE METABOLIC PANEL
ALT: 43 U/L (ref 0–44)
AST: 41 U/L (ref 15–41)
Albumin: 3.4 g/dL — ABNORMAL LOW (ref 3.5–5.0)
Alkaline Phosphatase: 70 U/L (ref 38–126)
Anion gap: 9 (ref 5–15)
BUN: 14 mg/dL (ref 6–20)
CO2: 24 mmol/L (ref 22–32)
Calcium: 8.7 mg/dL — ABNORMAL LOW (ref 8.9–10.3)
Chloride: 104 mmol/L (ref 98–111)
Creatinine, Ser: 0.64 mg/dL (ref 0.44–1.00)
GFR calc Af Amer: >60 mL/min (ref >60)
GFR calc non Af Amer: >60 mL/min (ref >60)
Glucose, Bld: 138 mg/dL — ABNORMAL HIGH (ref 70–99)
Potassium: 4.4 mmol/L (ref 3.5–5.1)
Sodium: 137 mmol/L (ref 135–145)
Total Bilirubin: 0.8 mg/dL (ref 0.3–1.2)
Total Protein: 7 g/dL (ref 6.5–8.1)

## 2019-03-30 LAB — CBC
HCT: 37.5 % (ref 36.0–46.0)
Hemoglobin: 11.7 g/dL — ABNORMAL LOW (ref 12.0–15.0)
MCH: 27.7 pg (ref 26.0–34.0)
MCHC: 31.2 g/dL (ref 30.0–36.0)
MCV: 88.9 fL (ref 80.0–100.0)
Platelets: 257 10*3/uL (ref 150–400)
RBC: 4.22 MIL/uL (ref 3.87–5.11)
RDW: 14.8 % (ref 11.5–15.5)
WBC: 7.2 10*3/uL (ref 4.0–10.5)
nRBC: 0 % (ref 0.0–0.2)

## 2019-03-30 NOTE — Progress Notes (Signed)
PROGRESS NOTE  HA REINKING [id number] DOB: Jun 12, 1960 [name]: 03/18/2019 PCP: [name], MD  HPI/Recap of past 24 hours: Kelsey Bullock a 58 y.o.femalewith a history of morbid obesity who was admitted following surgery for a poorly healing infraumbilical surgical wound. She had initially had an ex lap with small bowel resection for incarcerated ventral hernia on 02/02/2019. Slow wound healing, though discharged with drain removed on 9/8, having her sister perform wet-to-dry dressing changes. Due to continued pain at the site and failure of the wound to close, CT was performed 10/9 and showed 6x4x4cm collection of gas and debris in the subcutaneous fat at the site with deeper collection felt to be an area of infection at the level of the fascia, possibly tracking behind the rectus sheath. General surgeon, Dr. Georgette Dover, performed wound exploration with wound vac placement (3 x 6 x 12cm deep) on 10/13. Small gaps in the fascial closure were noted and necrotic fascia was sharply debrided prior to vac placement.   Though pre-surgical covid testing was negative, this was repeated when her sister was diagnosed with covid-19. SARS-CoV-2 PCR collected 10/14 was positive on 10/16, albeit with no CXR infiltrates or respiratory symptoms. This made discharge with wound vac impossible as home health was reportedly unable to accommodate covid-positive patients and the patient was expected to require at least another week of vac care, so she was transferred to [facility] 10/16.  General surgery following, who last saw patient on 10/23.  At that time, patient was postop day 10 and wound is noted to be healing well and gradually getting smaller although she is felt to continue to need wound VAC.  Possible transition off of wound VAC this week, and at that point, patient could go home  Patient feels rough, some soreness at site of wound VAC.  Also complains of headache when she is not  sleeping.  Assessment/Plan: Principal Problem:   Chronic abdominal wound infection: Status post ex lap 8/30 followed by wound exploration with wound VAC placement 10/13.  Ongoing wound VAC changes, hopeful transition to wet-to-dry dressings soon.  Analgesia as per surgery orders. Active Problems:   HYPERCHOLESTEROLEMIA: Continue statin.    Morbid obesity [facility]): Patient meets criteria BMI greater than 50.    Anxiety state: Continue as needed home clonazepam.    HYPERTENSION, BENIGN SYSTEMIC: Blood pressure stable.    Asthma   Controlled type 2 diabetes mellitus with neuropathy ([place]): Last A1c at 6.1.  Not on steroids, CBGs well controlled.    GERD (gastroesophageal reflux disease)   Diastolic CHF with preserved left ventricular function, NYHA class 2 ([place])   COVID-19 virus pneumonia with secondary acute hypoxic respiratory failure: Patient's oxygenation slowly continues to improve.  Down to 1 L.  Inflammatory markers minimal.  Completed 5-day course of Remdisivir on 10/22.  Avoiding steroids given problems of wound healing.  Continue to encourage incentive spirometer use.    Constipation   Code Status: Full code  Family Communication: Left message with daughter  Disposition Plan: Home once patient transitions off of wound vac (as per surgery), hopefully this week.    Consultants:  General Surgery   Procedures:  Status post wound exploration and placement of wound VAC done 10/13  Antimicrobials:  Vancomycin x1 dose 10/13  Remdisivir 10/18-10/22  DVT prophylaxis: Lovenox   Objective: Vitals:   03/30/19 0337 03/30/19 0717  BP: 105/64 121/64  Pulse: 72 79  Resp:  18  Temp: 97.9 F (36.6 C) 98 F (36.7 C)  SpO2: 92% 93%    Intake/Output Summary (Last 24 hours) at 03/30/2019 1222 Last data filed at 03/30/2019 0900 Gross per 24 hour  Intake 358 ml  Output --  Net 358 ml   Filed Weights   03/19/19 2100 03/21/19 2206  Weight: (!) 153.6 kg (!) 156.3 kg    Body mass index is 53.97 kg/m.  Exam:   General: Alert and oriented x3, fatigued  Cardiovascular: Regular rate and rhythm, S1-S2  Respiratory: Clear to auscultation bilaterally  Abdomen: Soft, obese, tender, wound VAC in place  Musculoskeletal: No clubbing or cyanosis, trace pitting edema  Psychiatry: Appropriate, no evidence of psychoses   Data Reviewed: CBC: Recent Labs  Lab 03/25/19 0430 03/26/19 1125 03/28/19 0037 03/30/19 0047  WBC 3.7* 4.6 5.5 7.2  NEUTROABS 1.7 2.9 2.9  --   HGB 11.4* 11.3* 11.3* 11.7*  HCT 36.1 36.0 36.2 37.5  MCV 88.9 88.5 88.3 88.9  PLT 148* 127* 184 [id number]   Basic Metabolic Panel: Recent Labs  Lab 03/25/19 0430 03/26/19 1125 03/28/19 0037 03/30/19 0047  NA 136 135 136 137  K 4.0 4.0 3.8 4.4  CL 98 100 103 104  CO2 27 25 24 24   GLUCOSE 107* 142* 106* 138*  BUN 14 12 13 14   CREATININE 0.59 0.51 0.54 0.64  CALCIUM 8.4* 8.2* 8.5* 8.7*   GFR: Estimated Creatinine Clearance: 121.9 mL/min (by C-G formula based on SCr of 0.64 mg/dL). Liver Function Tests: Recent Labs  Lab 03/25/19 0430 03/26/19 1125 03/28/19 0037 03/30/19 0047  AST 24 29 30  41  ALT 19 24 30  43  ALKPHOS 56 63 65 70  BILITOT 0.9 0.7 1.0 0.8  PROT 6.4* 6.7 6.9 7.0  ALBUMIN 3.1* 3.1* 3.3* 3.4*   No results for input(s): LIPASE, AMYLASE in the last 168 hours. No results for input(s): AMMONIA in the last 168 hours. Coagulation Profile: No results for input(s): INR, PROTIME in the last 168 hours. Cardiac Enzymes: No results for input(s): CKTOTAL, CKMB, CKMBINDEX, TROPONINI in the last 168 hours. BNP (last 3 results) No results for input(s): PROBNP in the last 8760 hours. HbA1C: No results for input(s): HGBA1C in the last 72 hours. CBG: Recent Labs  Lab 03/25/19 2235 03/26/19 0825 03/26/19 1147 03/26/19 1638 03/26/19 2353  GLUCAP 127* 91 135* 91 98   Lipid Profile: No results for input(s): CHOL, HDL, LDLCALC, TRIG, CHOLHDL, LDLDIRECT in the last 72  hours. Thyroid Function Tests: No results for input(s): TSH, T4TOTAL, FREET4, T3FREE, THYROIDAB in the last 72 hours. Anemia Panel: No results for input(s): VITAMINB12, FOLATE, FERRITIN, TIBC, IRON, RETICCTPCT in the last 72 hours. Urine analysis:    Component Value Date/Time   COLORURINE AMBER (A) 02/06/2019 1550   APPEARANCEUR HAZY (A) 02/06/2019 1550   LABSPEC 1.025 02/06/2019 1550   PHURINE 7.0 02/06/2019 1550   GLUCOSEU NEGATIVE 02/06/2019 1550   HGBUR NEGATIVE 02/06/2019 1550   HGBUR trace-intact 10/12/2008 1058   BILIRUBINUR SMALL (A) 02/06/2019 1550   BILIRUBINUR negative 09/21/2016 0911   KETONESUR 20 (A) 02/06/2019 1550   PROTEINUR 30 (A) 02/06/2019 1550   UROBILINOGEN 0.2 09/21/2016 0911   UROBILINOGEN 0.2 10/12/2008 1058   NITRITE POSITIVE (A) 02/06/2019 1550   LEUKOCYTESUR NEGATIVE 02/06/2019 1550   Sepsis Labs: @LABRCNTIP (procalcitonin:4,lacticidven:4)  )No results found for this or any previous visit (from the past 240 hour(s)).    Studies: No results found.  Scheduled Meds: . atorvastatin  40 mg Oral QHS  . budesonide  1 puff Inhalation BID  .  docusate sodium  200 mg Oral BID  . enoxaparin (LOVENOX) injection  0.5 mg/kg Subcutaneous Q24H  . erythromycin  1 application Both Eyes QHS  . famotidine  20 mg Oral BID  . feeding supplement (ENSURE ENLIVE)  237 mL Oral TID BM  . fluticasone  2 spray Each Nare Daily  . furosemide  20 mg Oral Daily  . lisinopril  40 mg Oral Daily  . mometasone-formoterol  2 puff Inhalation BID  . montelukast  10 mg Oral QHS  . multivitamin with minerals  1 tablet Oral Daily  . norethindrone  2.5 mg Oral QHS  . olopatadine  1 drop Both Eyes QHS  . polyethylene glycol  17 g Oral BID  . senna  2 tablet Oral BID  . vitamin C  1,000 mg Oral Daily  . zinc sulfate  220 mg Oral Daily    Continuous Infusions:   LOS: 12 days     Annita Brod, MD [facility]  To reach me or the doctor on call, go  to: [email or web address] Password [email or web address]  03/30/2019, 12:22 PM

## 2019-03-31 ENCOUNTER — Telehealth: Payer: Self-pay | Admitting: *Deleted

## 2019-03-31 DIAGNOSIS — J9601 Acute respiratory failure with hypoxia: Secondary | ICD-10-CM | POA: Diagnosis present

## 2019-03-31 NOTE — Telephone Encounter (Signed)
L/m patient needs MD appt for reapproval  Of Xolair. Same expires 11/27

## 2019-03-31 NOTE — Progress Notes (Signed)
[facility] Progress Note  13 Days Post-Op  Subjective: CC-  Doing well. Abdominal pain well controlled. Denies n/v. Tolerating diet.  Objective: Vital signs in last 24 hours: Temp:  [97.8 F (36.6 C)-98.1 F (36.7 C)] 98.1 F (36.7 C) (10/26 0715) Pulse Rate:  [69-77] 77 (10/26 0715) Resp:  [18-20] 18 (10/26 0715) BP: (106-118)/(58-70) 108/70 (10/26 0829) SpO2:  [92 %-96 %] 96 % (10/26 0715) Last BM Date: 03/29/19  Intake/Output from previous day: 10/25 0701 - 10/26 0700 In: 960 [P.O.:960] Out: -  Intake/Output this shift: No intake/output data recorded.  PE: Gen: alert, NAD Pulm: rate and effort normal on RA Skin: warm and dry Abd: soft, obese, mild tenderness over wound otherwise nontender. Wound beefy red without erythema or drainage, measures 6x3x3.5cm with 6cm tunneling proximally   Lab Results:  Recent Labs    03/30/19 0047  WBC 7.2  HGB 11.7*  HCT 37.5  PLT 257   BMET Recent Labs    03/30/19 0047  NA 137  K 4.4  CL 104  CO2 24  GLUCOSE 138*  BUN 14  CREATININE 0.64  CALCIUM 8.7*   PT/INR No results for input(s): LABPROT, INR in the last 72 hours. CMP     Component Value Date/Time   NA 137 03/30/2019 0047   NA 142 02/27/2019 1314   K 4.4 03/30/2019 0047   CL 104 03/30/2019 0047   CO2 24 03/30/2019 0047   GLUCOSE 138 (H) 03/30/2019 0047   BUN 14 03/30/2019 0047   BUN 10 02/27/2019 1314   CREATININE 0.64 03/30/2019 0047   CREATININE 0.64 08/26/2015 0900   CALCIUM 8.7 (L) 03/30/2019 0047   PROT 7.0 03/30/2019 0047   PROT 6.8 02/27/2019 1314   ALBUMIN 3.4 (L) 03/30/2019 0047   ALBUMIN 3.9 02/27/2019 1314   AST 41 03/30/2019 0047   ALT 43 03/30/2019 0047   ALKPHOS 70 03/30/2019 0047   BILITOT 0.8 03/30/2019 0047   BILITOT 0.8 02/27/2019 1314   GFRNONAA >60 03/30/2019 0047   GFRNONAA >89 08/26/2015 0900   GFRAA >60 03/30/2019 0047   GFRAA >89 08/26/2015 0900   Lipase     Component Value Date/Time   LIPASE 22  02/02/2019 1810       Studies/Results: No results found.  Anti-infectives: Anti-infectives (From admission, onward)   Start     Dose/Rate Route Frequency Ordered Stop   03/24/19 1000  remdesivir 100 mg in sodium chloride 0.9 % 250 mL IVPB     100 mg 500 mL/hr over 30 Minutes Intravenous Every 24 hours 03/23/19 0916 03/27/19 1446   03/23/19 1100  remdesivir 200 mg in sodium chloride 0.9 % 250 mL IVPB     200 mg 500 mL/hr over 30 Minutes Intravenous Once 03/23/19 0916 03/23/19 1310   03/18/19 1215  fluconazole (DIFLUCAN) tablet 100 mg  Status:  Discontinued     100 mg Oral Daily 03/18/19 1202 03/18/19 1241   03/18/19 0400  vancomycin (VANCOCIN) 1,500 mg in sodium chloride 0.9 % 500 mL IVPB     1,500 mg 250 mL/hr over 120 Minutes Intravenous On call to O.R. 03/17/19 0703 03/18/19 1054       Assessment/Plan OSA  HTN Asthma Anxiety  CHF  COVID-19 infection  S/P ex lap, SBR and primary repair of ventral hernia 02/03/2019 Wound infection after surgery -S/PAbdominal wound exploration, placement of wound VAC 3 x 6 x 12 cm deep, Dr. Georgette Dover, 10/13 - POD#13 - Wound is healing well but still  with significant tunneling proximally. Would benefit from continuing wound vac (white foam at base and top with small piece of black foam) to improve healing until base of wound is more visible. Bedside nurse to change wound vac Wednesday, I will see patient again on Friday.   [id number] VTE: SCD's, lovenox [id number] & Diflucan once, remdesivir 10/19>>10/22 Foley:none Follow up:TBD   LOS: 13 days    Wellington Hampshire, [facility] 03/31/2019, 2:03 PM Please see Amion for pager number during day hours 7:00am-4:30pm

## 2019-03-31 NOTE — TOC Progression Note (Signed)
Transition of Care [facility]) - Progression Note    Patient Details  Name: Kelsey Bullock MRN: [id number] Date of Birth: 1960-12-02  Transition of Care [facility]) CM/SW Contact  Loletha Grayer Beverely Pace, RN Phone Number: 03/31/2019, 1:32 PM Chauncey Reading Pendletonis a 58 y.o.femalewith a history of morbid obesity who was admitted following surgery for a poorly healing infraumbilical surgical wound. She had initially had an ex lap with small bowel resection for incarcerated ventral hernia on 02/02/2019. Slow wound healing, though discharged with drain removed on 9/8, having her sister perform wet-to-dry dressing changes. Due to continued pain at the site and failure of the wound to close, CT was performed 10/9 and showed 6x4x4cm collection of gas and debris in the subcutaneous fat at the site with deeper collection felt to be an area of infection at the level of the fascia, possibly tracking behind the rectus sheath. General surgeon, Dr. Georgette Dover, performed wound exploration with wound vac placement (3 x 6 x 12cm deep) on 10/13. Small gaps in the fascial closure were noted and necrotic fascia was sharply debrided prior to vac placement.  Clinical Narrative:   COVID-19 virus pneumonia with secondary acute hypoxic respiratory failure.     Expected Discharge Plan: [place] Services Barriers to Discharge: Other (comment)([facility] backed out of resuming HH for pt - unable to find [facility] that will take pt)  Expected Discharge Plan and Services Expected Discharge Plan: [place] Choice: [place] arrangements for the past 2 months: Single Family Home                 DME Arranged: N/A         HH Arranged: RN, PT [place] Agency: [place] (now [facility]) Date [place]: 03/19/19 Time [place]: [place] Representative spoke with at [place]: [facility]) Interventions    Readmission Risk  Interventions No flowsheet data found.

## 2019-03-31 NOTE — Progress Notes (Signed)
PROGRESS NOTE  Kelsey Bullock [id number] DOB: 21-May-1961 [name]: 03/18/2019 PCP: [name], MD  HPI/Recap of past 24 hours: Kelsey Demoulin Pendletonis a 58 y.o.femalewith a history of morbid obesity who was admitted following surgery for a poorly healing infraumbilical surgical wound. She had initially had an ex lap with small bowel resection for incarcerated ventral hernia on 02/02/2019. Slow wound healing, though discharged with drain removed on 9/8, having her sister perform wet-to-dry dressing changes. Due to continued pain at the site and failure of the wound to close, CT was performed 10/9 and showed 6x4x4cm collection of gas and debris in the subcutaneous fat at the site with deeper collection felt to be an area of infection at the level of the fascia, possibly tracking behind the rectus sheath. General surgeon, Dr. Georgette Dover, performed wound exploration with wound vac placement (3 x 6 x 12cm deep) on 10/13. Small gaps in the fascial closure were noted and necrotic fascia was sharply debrided prior to vac placement.   Though pre-surgical covid testing was negative, this was repeated when her sister was diagnosed with covid-19. SARS-CoV-2 PCR collected 10/14 was positive on 10/16, albeit with no CXR infiltrates or respiratory symptoms. This made discharge with wound vac impossible as home health was reportedly unable to accommodate covid-positive patients and the patient was expected to require at least another week of vac care, so she was transferred to [facility] 10/16.  General surgery following, who last saw patient on 10/23.  At that time, patient was postop day 10 and wound is noted to be healing well and gradually getting smaller although she is felt to continue to need wound VAC.  Possible transition off of wound VAC this week, and at that point, patient could go home  Patient doing okay.  Still continued headache although minimal abdominal pain.  No other complaints.  Assessment/Plan:  Principal Problem:   Chronic abdominal wound infection: Status post ex lap 8/30 followed by wound exploration with wound VAC placement 10/13.  Ongoing wound VAC changes, hopeful transition to wet-to-dry dressings soon.  Analgesia as per surgery orders. Active Problems:   HYPERCHOLESTEROLEMIA: Continue statin.    Morbid obesity [facility]): Patient meets criteria BMI greater than 50.    Anxiety state: Continue as needed home clonazepam.    HYPERTENSION, BENIGN SYSTEMIC: Blood pressure stable.    Asthma   Controlled type 2 diabetes mellitus with neuropathy ([place]): Last A1c at 6.1.  Not on steroids, CBGs well controlled.    GERD (gastroesophageal reflux disease)   Diastolic CHF with preserved left ventricular function, NYHA class 2 ([place]): Stable, monitoring volumes.     COVID-19 virus pneumonia with secondary acute hypoxic respiratory failure: Patient's oxygenation slowly continues to improve.  Down to 1 L.  Inflammatory markers minimal.  Completed 5-day course of Remdisivir on 10/22.  Avoiding steroids given problems of wound healing.  Continue to encourage incentive spirometer use.    Constipation: Treated.  Headache: Felt to be secondary to COVID-19.   Code Status: Full code  Family Communication: Left message with daughter  Disposition Plan: Home once patient transitions off of wound vac (as per surgery), hopefully this week.    Consultants:  General Surgery   Procedures:  Status post wound exploration and placement of wound VAC done 10/13  Antimicrobials:  Vancomycin x1 dose 10/13  Remdisivir 10/18-10/22  DVT prophylaxis: Lovenox   Objective: Vitals:   03/31/19 0715 03/31/19 0829  BP: 116/63 108/70  Pulse: 77   Resp: 18  Temp: 98.1 F (36.7 C)   SpO2: 96%     Intake/Output Summary (Last 24 hours) at 03/31/2019 1322 Last data filed at 03/30/2019 1912 Gross per 24 hour  Intake 360 ml  Output -  Net 360 ml   Filed Weights   03/19/19 2100 03/21/19 2206   Weight: (!) 153.6 kg (!) 156.3 kg   Body mass index is 53.97 kg/m.  Exam:   General: Alert and oriented x3, fatigued  Cardiovascular: Regular rate and rhythm, S1-S2  Respiratory: Clear to auscultation bilaterally  Abdomen: Soft, obese, tender, wound VAC in place  Musculoskeletal: No clubbing or cyanosis, trace pitting edema  Psychiatry: Appropriate, no evidence of psychoses   Data Reviewed: CBC: Recent Labs  Lab 03/25/19 0430 03/26/19 1125 03/28/19 0037 03/30/19 0047  WBC 3.7* 4.6 5.5 7.2  NEUTROABS 1.7 2.9 2.9  --   HGB 11.4* 11.3* 11.3* 11.7*  HCT 36.1 36.0 36.2 37.5  MCV 88.9 88.5 88.3 88.9  PLT 148* 127* 184 [id number]   Basic Metabolic Panel: Recent Labs  Lab 03/25/19 0430 03/26/19 1125 03/28/19 0037 03/30/19 0047  NA 136 135 136 137  K 4.0 4.0 3.8 4.4  CL 98 100 103 104  CO2 27 25 24 24   GLUCOSE 107* 142* 106* 138*  BUN 14 12 13 14   CREATININE 0.59 0.51 0.54 0.64  CALCIUM 8.4* 8.2* 8.5* 8.7*   GFR: Estimated Creatinine Clearance: 121.9 mL/min (by C-G formula based on SCr of 0.64 mg/dL). Liver Function Tests: Recent Labs  Lab 03/25/19 0430 03/26/19 1125 03/28/19 0037 03/30/19 0047  AST 24 29 30  41  ALT 19 24 30  43  ALKPHOS 56 63 65 70  BILITOT 0.9 0.7 1.0 0.8  PROT 6.4* 6.7 6.9 7.0  ALBUMIN 3.1* 3.1* 3.3* 3.4*   No results for input(s): LIPASE, AMYLASE in the last 168 hours. No results for input(s): AMMONIA in the last 168 hours. Coagulation Profile: No results for input(s): INR, PROTIME in the last 168 hours. Cardiac Enzymes: No results for input(s): CKTOTAL, CKMB, CKMBINDEX, TROPONINI in the last 168 hours. BNP (last 3 results) No results for input(s): PROBNP in the last 8760 hours. HbA1C: No results for input(s): HGBA1C in the last 72 hours. CBG: Recent Labs  Lab 03/25/19 2235 03/26/19 0825 03/26/19 1147 03/26/19 1638 03/26/19 2353  GLUCAP 127* 91 135* 91 98   Lipid Profile: No results for input(s): CHOL, HDL, LDLCALC, TRIG,  CHOLHDL, LDLDIRECT in the last 72 hours. Thyroid Function Tests: No results for input(s): TSH, T4TOTAL, FREET4, T3FREE, THYROIDAB in the last 72 hours. Anemia Panel: No results for input(s): VITAMINB12, FOLATE, FERRITIN, TIBC, IRON, RETICCTPCT in the last 72 hours. Urine analysis:    Component Value Date/Time   COLORURINE AMBER (A) 02/06/2019 1550   APPEARANCEUR HAZY (A) 02/06/2019 1550   LABSPEC 1.025 02/06/2019 1550   PHURINE 7.0 02/06/2019 1550   GLUCOSEU NEGATIVE 02/06/2019 1550   HGBUR NEGATIVE 02/06/2019 1550   HGBUR trace-intact 10/12/2008 1058   BILIRUBINUR SMALL (A) 02/06/2019 1550   BILIRUBINUR negative 09/21/2016 0911   KETONESUR 20 (A) 02/06/2019 1550   PROTEINUR 30 (A) 02/06/2019 1550   UROBILINOGEN 0.2 09/21/2016 0911   UROBILINOGEN 0.2 10/12/2008 1058   NITRITE POSITIVE (A) 02/06/2019 1550   LEUKOCYTESUR NEGATIVE 02/06/2019 1550   Sepsis Labs: @LABRCNTIP (procalcitonin:4,lacticidven:4)  )No results found for this or any previous visit (from the past 240 hour(s)).    Studies: No results found.  Scheduled Meds: . atorvastatin  40 mg Oral QHS  .  budesonide  1 puff Inhalation BID  . docusate sodium  200 mg Oral BID  . enoxaparin (LOVENOX) injection  0.5 mg/kg Subcutaneous Q24H  . erythromycin  1 application Both Eyes QHS  . famotidine  20 mg Oral BID  . feeding supplement (ENSURE ENLIVE)  237 mL Oral TID BM  . fluticasone  2 spray Each Nare Daily  . furosemide  20 mg Oral Daily  . lisinopril  40 mg Oral Daily  . mometasone-formoterol  2 puff Inhalation BID  . montelukast  10 mg Oral QHS  . multivitamin with minerals  1 tablet Oral Daily  . norethindrone  2.5 mg Oral QHS  . olopatadine  1 drop Both Eyes QHS  . polyethylene glycol  17 g Oral BID  . senna  2 tablet Oral BID  . vitamin C  1,000 mg Oral Daily  . zinc sulfate  220 mg Oral Daily    Continuous Infusions:   LOS: 13 days     Annita Brod, MD [facility]  To reach me or the  doctor on call, go to: [email or web address] Password [email or web address]  03/31/2019, 1:22 PM

## 2019-04-01 NOTE — Progress Notes (Signed)
PROGRESS NOTE  Kelsey Bullock [id number] DOB: 06-24-1960 [name]: 03/18/2019 PCP: [name], MD  HPI/Recap of past 24 hours: Kelsey Nicole Pendletonis a 58 y.o.femalewith a history of morbid obesity who was admitted following surgery for a poorly healing infraumbilical surgical wound. She had initially had an ex lap with small bowel resection for incarcerated ventral hernia on 02/02/2019. Slow wound healing, though discharged with drain removed on 9/8, having her sister perform wet-to-dry dressing changes. Due to continued pain at the site and failure of the wound to close, CT was performed 10/9 and showed 6x4x4cm collection of gas and debris in the subcutaneous fat at the site with deeper collection felt to be an area of infection at the level of the fascia, possibly tracking behind the rectus sheath. General surgeon, Dr. Georgette Dover, performed wound exploration with wound vac placement (3 x 6 x 12cm deep) on 10/13. Small gaps in the fascial closure were noted and necrotic fascia was sharply debrided prior to vac placement.   Though pre-surgical covid testing was negative, this was repeated when her sister was diagnosed with covid-19. SARS-CoV-2 PCR collected 10/14 was positive on 10/16, albeit with no CXR infiltrates or respiratory symptoms. This made discharge with wound vac impossible as home health was reportedly unable to accommodate covid-positive patients and the patient was expected to require at least another week of vac care, so she was transferred to [facility] 10/16.  General surgery following, who last saw patient on 10/23.  At that time, patient was postop day 10 and wound is noted to be healing well and gradually getting smaller although she is felt to continue to need wound VAC.  Possible transition off of wound VAC this week, and at that point, patient could go home  Patient doing okay.  Still continued headache although minimal abdominal pain.  No other  complaints.  Assessment/Plan: Principal Problem:   Chronic abdominal wound infection: Status post ex lap 8/30 followed by wound exploration with wound VAC placement 10/13.  Ongoing wound VAC changes, hopeful transition to wet-to-dry dressings soon.  Analgesia as per surgery orders.  Discussed with case management.  Patient will need to manage her dressing changes on days 1 home health is not fair thus need for discontinuation of wound VAC prior to returning home. Active Problems:   HYPERCHOLESTEROLEMIA: Continue statin.    Morbid obesity [facility]): Patient meets criteria BMI greater than 50.    Anxiety state: Continue as needed home clonazepam.    HYPERTENSION, BENIGN SYSTEMIC: Blood pressure stable.    Asthma   Controlled type 2 diabetes mellitus with neuropathy ([place]): Last A1c at 6.1.  Not on steroids, CBGs well controlled.    GERD (gastroesophageal reflux disease)   Diastolic CHF with preserved left ventricular function, NYHA class 2 ([place]): Stable, monitoring volumes.     COVID-19 virus pneumonia with secondary acute hypoxic respiratory failure: Patient's oxygenation slowly continues to improve.  Down to 1 L.  Inflammatory markers minimal.  Completed 5-day course of Remdisivir on 10/22.  Avoiding steroids given problems of wound healing.  Continue to encourage incentive spirometer use.    Constipation: Treated.  Headache: Felt to be secondary to COVID-19.   Code Status: Full code  Family Communication: Left message with daughter  Disposition Plan: Home once patient transitions off of wound vac (as per surgery), hopefully this week.    Consultants:  General Surgery   Procedures:  Status post wound exploration and placement of wound VAC done 10/13  Antimicrobials:  Vancomycin  x1 dose 10/13  Remdisivir 10/18-10/22  DVT prophylaxis: Lovenox   Objective: Vitals:   04/01/19 0700 04/01/19 0757  BP: 115/66   Pulse:    Resp: 18   Temp: 98.2 F (36.8 C)   SpO2:  92%     Intake/Output Summary (Last 24 hours) at 04/01/2019 1521 Last data filed at 04/01/2019 1459 Gross per 24 hour  Intake 240 ml  Output --  Net 240 ml   Filed Weights   03/19/19 2100 03/21/19 2206  Weight: (!) 153.6 kg (!) 156.3 kg   Body mass index is 53.97 kg/m.  Exam:   General: Alert and oriented x3, fatigued  Cardiovascular: Regular rate and rhythm, S1-S2  Respiratory: Clear to auscultation bilaterally  Abdomen: Soft, obese, tender, wound VAC in place  Musculoskeletal: No clubbing or cyanosis, trace pitting edema  Psychiatry: Appropriate, no evidence of psychoses   Data Reviewed: CBC: Recent Labs  Lab 03/26/19 1125 03/28/19 0037 03/30/19 0047  WBC 4.6 5.5 7.2  NEUTROABS 2.9 2.9  --   HGB 11.3* 11.3* 11.7*  HCT 36.0 36.2 37.5  MCV 88.5 88.3 88.9  PLT 127* 184 [id number]   Basic Metabolic Panel: Recent Labs  Lab 03/26/19 1125 03/28/19 0037 03/30/19 0047  NA 135 136 137  K 4.0 3.8 4.4  CL 100 103 104  CO2 25 24 24   GLUCOSE 142* 106* 138*  BUN 12 13 14   CREATININE 0.51 0.54 0.64  CALCIUM 8.2* 8.5* 8.7*   GFR: Estimated Creatinine Clearance: 121.9 mL/min (by C-G formula based on SCr of 0.64 mg/dL). Liver Function Tests: Recent Labs  Lab 03/26/19 1125 03/28/19 0037 03/30/19 0047  AST 29 30 41  ALT 24 30 43  ALKPHOS 63 65 70  BILITOT 0.7 1.0 0.8  PROT 6.7 6.9 7.0  ALBUMIN 3.1* 3.3* 3.4*   No results for input(s): LIPASE, AMYLASE in the last 168 hours. No results for input(s): AMMONIA in the last 168 hours. Coagulation Profile: No results for input(s): INR, PROTIME in the last 168 hours. Cardiac Enzymes: No results for input(s): CKTOTAL, CKMB, CKMBINDEX, TROPONINI in the last 168 hours. BNP (last 3 results) No results for input(s): PROBNP in the last 8760 hours. HbA1C: No results for input(s): HGBA1C in the last 72 hours. CBG: Recent Labs  Lab 03/25/19 2235 03/26/19 0825 03/26/19 1147 03/26/19 1638 03/26/19 2353  GLUCAP 127* 91 135*  91 98   Lipid Profile: No results for input(s): CHOL, HDL, LDLCALC, TRIG, CHOLHDL, LDLDIRECT in the last 72 hours. Thyroid Function Tests: No results for input(s): TSH, T4TOTAL, FREET4, T3FREE, THYROIDAB in the last 72 hours. Anemia Panel: No results for input(s): VITAMINB12, FOLATE, FERRITIN, TIBC, IRON, RETICCTPCT in the last 72 hours. Urine analysis:    Component Value Date/Time   COLORURINE AMBER (A) 02/06/2019 1550   APPEARANCEUR HAZY (A) 02/06/2019 1550   LABSPEC 1.025 02/06/2019 1550   PHURINE 7.0 02/06/2019 1550   GLUCOSEU NEGATIVE 02/06/2019 1550   HGBUR NEGATIVE 02/06/2019 1550   HGBUR trace-intact 10/12/2008 1058   BILIRUBINUR SMALL (A) 02/06/2019 1550   BILIRUBINUR negative 09/21/2016 0911   KETONESUR 20 (A) 02/06/2019 1550   PROTEINUR 30 (A) 02/06/2019 1550   UROBILINOGEN 0.2 09/21/2016 0911   UROBILINOGEN 0.2 10/12/2008 1058   NITRITE POSITIVE (A) 02/06/2019 1550   LEUKOCYTESUR NEGATIVE 02/06/2019 1550   Sepsis Labs: @LABRCNTIP (procalcitonin:4,lacticidven:4)  )No results found for this or any previous visit (from the past 240 hour(s)).    Studies: No results found.  Scheduled Meds: . atorvastatin  40 mg Oral QHS  . budesonide  1 puff Inhalation BID  . docusate sodium  200 mg Oral BID  . enoxaparin (LOVENOX) injection  0.5 mg/kg Subcutaneous Q24H  . erythromycin  1 application Both Eyes QHS  . famotidine  20 mg Oral BID  . feeding supplement (ENSURE ENLIVE)  237 mL Oral TID BM  . fluticasone  2 spray Each Nare Daily  . furosemide  20 mg Oral Daily  . lisinopril  40 mg Oral Daily  . mometasone-formoterol  2 puff Inhalation BID  . montelukast  10 mg Oral QHS  . multivitamin with minerals  1 tablet Oral Daily  . norethindrone  2.5 mg Oral QHS  . olopatadine  1 drop Both Eyes QHS  . polyethylene glycol  17 g Oral BID  . senna  2 tablet Oral BID  . vitamin C  1,000 mg Oral Daily  . zinc sulfate  220 mg Oral Daily    Continuous Infusions:   LOS: 14  days     Annita Brod, MD [facility]  To reach me or the doctor on call, go to: [email or web address] Password [email or web address]  04/01/2019, 3:21 PM

## 2019-04-01 NOTE — Progress Notes (Signed)
Nutrition Follow-up   RD working remotely.  DOCUMENTATION CODES:   Morbid obesity  INTERVENTION:    Continue Ensure Enlive po TID, each supplement provides 350 kcal and 20 grams of protein  Continue MVI with minerals daily  Continue Hormel Shake daily with Breakfast which provides 520 kcals and 22 g of protein and Magic cup BID with lunch and dinner, each supplement provides 290 kcal and 9 grams of protein, automatically on meal trays to optimize nutritional intake.    NUTRITION DIAGNOSIS:   Increased nutrient needs related to wound healing as evidenced by estimated needs.  Ongoing   GOAL:   Patient will meet greater than or equal to 90% of their needs   Met with intake of meals and supplements  MONITOR:   PO intake, Supplement acceptance, Labs, Weight trends, Skin, I & O's  ASSESSMENT:   58 yo female admitted after surgery for a poorly healing infraumbilical surgical wound (ex lap with SBR on 02/02/19). Tested positive for COVID on 10/14; transferred to [facility] on 10/16. PMH includes morbid obesity, HTN, asthma, GERD, CHF.  10/13: Abdominal wound exploration, placement of wound VAC. 10/14: COVID test; returned positive on 10/16.   Reviewed meal completion records - meal completion 25-100% of meals, mostly >/= 75%. Per MAR, pt is accepting Ensure supplements 1-3 times per day.   Medications reviewed and include Colace, lasix, MVI, Miralax, Senokot, vitamin C, zinc sulfate.  Labs reviewed.   Wound is healing well, but has significant tunneling. VAC remains in place.  Plans for d/c home when able to remove VAC.  Diet Order:   Diet Order            Diet Carb Modified Fluid consistency: Thin; Room service appropriate? 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  Estimated Nutritional Needs:   Kcal:  2200-2500  Protein:  120-135 grams  Fluid:  >/= 2 L    Molli Barrows, RD, LDN, [place] Pager [phone] After Hours Pager [phone]

## 2019-04-02 ENCOUNTER — Inpatient Hospital Stay (HOSPITAL_COMMUNITY): Payer: Medicaid Other

## 2019-04-02 DIAGNOSIS — M7989 Other specified soft tissue disorders: Secondary | ICD-10-CM

## 2019-04-02 DIAGNOSIS — U071 COVID-19: Secondary | ICD-10-CM

## 2019-04-02 DIAGNOSIS — M79609 Pain in unspecified limb: Secondary | ICD-10-CM | POA: Diagnosis not present

## 2019-04-02 MED ORDER — PROCHLORPERAZINE EDISYLATE 10 MG/2ML IJ SOLN
10.0000 mg | INTRAMUSCULAR | Status: DC | PRN
  Administered 2019-04-03: 10 mg via INTRAVENOUS
  Filled 2019-04-02: qty 2

## 2019-04-02 NOTE — Progress Notes (Addendum)
On call provider call r/t patient voiced concern of persistent nausea. Last dose of anitemetic given at 1958 this evening.   Holding of Patient Meds 04/02/19 @ 2217- at request of patient, scheduled PO medications held second to complaints after persistent nausea after previous dose of Zofran adminstered IV at [place].

## 2019-04-02 NOTE — Progress Notes (Signed)
Venous duplex lower ext  has been completed. Refer to [facility] under chart review to view preliminary results.   04/02/2019  1:55 PM Crisanto Nied, Bonnye Fava

## 2019-04-02 NOTE — Progress Notes (Signed)
PROGRESS NOTE  Kelsey Bullock [id number] DOB: 02-19-1961 [name]: 03/18/2019 PCP: Kelsey Resides, MD   LOS: 15 days   Brief Narrative / Interim history: Kelsey Bullock is a 58 y.o. female with a history of morbid obesity who was admitted following surgery for a poorly healing infraumbilical surgical wound. She had initially had an ex lap with small bowel resection for incarcerated ventral hernia on 02/02/2019. Slow wound healing, though discharged with drain removed on 9/8, having her sister perform wet-to-dry dressing changes. Due to continued pain at the site and failure of the wound to close, CT was performed 10/9 and showed 6x4x4cm collection of gas and debris in the subcutaneous fat at the site with deeper collection felt to be an area of infection at the level of the fascia, possibly tracking behind the rectus sheath. She was scheduled for surgery. General surgery, Dr. Georgette Dover, performed wound exploration with wound vac placement (3 x 6 x 12cm deep) on 10/13. Small gaps in the fascial closure were noted and necrotic fascia was sharply debrided prior to vac placement. Though pre-surgical covid testing was negative, this was repeated when her sister was diagnosed with covid-19. SARS-CoV-2 PCR collected 10/14 was positive on 10/16, albeit with no CXR infiltrates or respiratory symptoms. This made discharge with wound vac impossible as home health was reportedly unable to accommodate covid-positive patients and the patient was expected to require at least another week of vac care, so she was transferred to [facility] 10/16.  General surgery following, potential plans for wound VAC removal early next week  Subjective / 24h Interval events: -feels a bit depressed from being here. Wants to be home. Denies SOB, no chest pain, no abdominal pain, nausea/vomiting.  Assessment & Plan: Principal Problem:   Chronic abdominal wound infection Active Problems:   HYPERCHOLESTEROLEMIA   Morbid obesity ([place])  Anxiety state   HYPERTENSION, BENIGN SYSTEMIC   Asthma   Controlled type 2 diabetes mellitus with neuropathy (HCC)   GERD (gastroesophageal reflux disease)   Diastolic CHF with preserved left ventricular function, NYHA class 2 ([place])   COVID-19 virus infection   Constipation   Acute respiratory failure with hypoxia (HCC)   Principal Problem Chronic abdominal wound infection  -Initially had ex lap with small bowel resection for incarcerated ventral hernia on 02/02/2019, now s/p wound exploration with wound vac placement (3 x 6 x 12cm deep) 10/13 by Dr. Georgette Dover.  -Will require ongoing wound vac changes/care prior to discharge. Unable to DC to SNF due to pt unable to give up check. General surgery to follow and, along with [place] team, care for wound. Wound vac change scheduled MWF per surgery. Next reevaluation is 10/30  Active Problems Acute hypoxic respiratory failure due to covid-19 pneumonia  -decreasing inflammatory markers and hypoxia resolved, on room air currently -Completed remdesivir 10/18 - 10/22 -Will attempt to avoid steroids given problems with wound healing. -Avoid NSAIDs if able -Incentive spirometry encouraged, OOB as able. Stressed need for mobilizing and IS to reduce hypoventilation and atelectasis.   COVID-19 Labs  No results for input(s): DDIMER, FERRITIN, LDH, CRP in the last 72 hours.  Lab Results  Component Value Date   [place] (A) 03/19/2019   [place] NOT DETECTED 03/14/2019   [place] NEGATIVE 02/02/2019   T2DM -Last HbA1c (6.1%) shows this is well controlled without medications. CBGs well controlled, not on steroids.  -No significant insulin requirement, can stop checking.   Constipation  -bas hx post-op ileus. Bowel regimen  Obesity  -BMI  59  Chronic HFpEF  -Clinical volume status is difficult, but roughly appears euvolemic and able to take po. Enlarged cardiac silhouette noted on CXR 10/16 on AP projection.  -Continue daily lasix,  lisinopril. Metabolic panel stable. -Follow I/O   DUB -Chronic, quiescent.  -Continue norethindrone qHS with lovenox at as below.  Hyperlipidemia  -Continue statin  Anxiety -Continue home clonazepam  Asthma -Continue singulair, dulera, prn albuterol. Also on omalizumab per [facility] not administering while inaptient.  -Avoid nebulized therapy while at high risk of transmission.  -Without wheezing, do not feel steroids would be helpful for this indication.  GERD -Continue pepcid  Hemorrhoids -Topical therapies. -Sitz baths prn  Scheduled Meds: . atorvastatin  40 mg Oral QHS  . budesonide  1 puff Inhalation BID  . docusate sodium  200 mg Oral BID  . enoxaparin (LOVENOX) injection  0.5 mg/kg Subcutaneous Q24H  . erythromycin  1 application Both Eyes QHS  . famotidine  20 mg Oral BID  . feeding supplement (ENSURE ENLIVE)  237 mL Oral TID BM  . fluticasone  2 spray Each Nare Daily  . furosemide  20 mg Oral Daily  . lisinopril  40 mg Oral Daily  . mometasone-formoterol  2 puff Inhalation BID  . montelukast  10 mg Oral QHS  . multivitamin with minerals  1 tablet Oral Daily  . norethindrone  2.5 mg Oral QHS  . olopatadine  1 drop Both Eyes QHS  . polyethylene glycol  17 g Oral BID  . senna  2 tablet Oral BID  . vitamin C  1,000 mg Oral Daily  . zinc sulfate  220 mg Oral Daily   Continuous Infusions: PRN Meds:.acetaminophen **OR** acetaminophen, albuterol, bisacodyl, clonazePAM, cyclobenzaprine, diphenhydrAMINE **OR** diphenhydrAMINE, hydrocortisone, morphine injection, nystatin, ondansetron **OR** ondansetron (ZOFRAN) IV, oxyCODONE, oxymetazoline, traMADol, witch hazel-glycerin  DVT prophylaxis: Lovenox Code Status: Full code Family Communication: d/w patient  Disposition Plan: home when no longer needs wound vac per surgery   Consultants:  General surgery   Procedures:  Abdominal wound exploration, placement of wound VAC 3 x 6 x 12 cm deep Surgeon: Maia Petties  Microbiology  None  Antimicrobials: Vanc x 1 10/13 Remdesivir 10/18 >> 10/22  Objective: Vitals:   04/01/19 1604 04/01/19 1930 04/02/19 0430 04/02/19 0748  BP: (!) 118/57  110/60 115/68  Pulse:  67 64 82  Resp: 18  20 18   Temp: 98.4 F (36.9 C)  98 F (36.7 C) 98.4 F (36.9 C)  TempSrc: Oral  Oral Oral  SpO2:  92% 95% 96%  Weight:      Height:        Intake/Output Summary (Last 24 hours) at 04/02/2019 0944 Last data filed at 04/01/2019 1819 Gross per 24 hour  Intake 240 ml  Output --  Net 240 ml   Filed Weights   03/19/19 2100 03/21/19 2206  Weight: (!) 153.6 kg (!) 156.3 kg    Examination:  Constitutional: NAD Respiratory: CTA biL Cardiovascular: RRR Abdomen: soft, NT, BS+, wound vac in place  Data Reviewed: I have independently reviewed following labs and imaging studies   CBC: Recent Labs  Lab 03/26/19 1125 03/28/19 0037 03/30/19 0047  WBC 4.6 5.5 7.2  NEUTROABS 2.9 2.9  --   HGB 11.3* 11.3* 11.7*  HCT 36.0 36.2 37.5  MCV 88.5 88.3 88.9  PLT 127* 184 [id number]   Basic Metabolic Panel: Recent Labs  Lab 03/26/19 1125 03/28/19 0037 03/30/19 0047  NA 135 136 137  K 4.0  3.8 4.4  CL 100 103 104  CO2 25 24 24   GLUCOSE 142* 106* 138*  BUN 12 13 14   CREATININE 0.51 0.54 0.64  CALCIUM 8.2* 8.5* 8.7*   GFR: Estimated Creatinine Clearance: 121.9 mL/min (by C-G formula based on SCr of 0.64 mg/dL). Liver Function Tests: Recent Labs  Lab 03/26/19 1125 03/28/19 0037 03/30/19 0047  AST 29 30 41  ALT 24 30 43  ALKPHOS 63 65 70  BILITOT 0.7 1.0 0.8  PROT 6.7 6.9 7.0  ALBUMIN 3.1* 3.3* 3.4*   No results for input(s): LIPASE, AMYLASE in the last 168 hours. No results for input(s): AMMONIA in the last 168 hours. Coagulation Profile: No results for input(s): INR, PROTIME in the last 168 hours. Cardiac Enzymes: No results for input(s): CKTOTAL, CKMB, CKMBINDEX, TROPONINI in the last 168 hours. BNP (last 3 results) No results for input(s):  PROBNP in the last 8760 hours. HbA1C: No results for input(s): HGBA1C in the last 72 hours. CBG: Recent Labs  Lab 03/26/19 1147 03/26/19 1638 03/26/19 2353  GLUCAP 135* 91 98   Lipid Profile: No results for input(s): CHOL, HDL, LDLCALC, TRIG, CHOLHDL, LDLDIRECT in the last 72 hours. Thyroid Function Tests: No results for input(s): TSH, T4TOTAL, FREET4, T3FREE, THYROIDAB in the last 72 hours. Anemia Panel: No results for input(s): VITAMINB12, FOLATE, FERRITIN, TIBC, IRON, RETICCTPCT in the last 72 hours. Urine analysis:    Component Value Date/Time   COLORURINE AMBER (A) 02/06/2019 1550   APPEARANCEUR HAZY (A) 02/06/2019 1550   LABSPEC 1.025 02/06/2019 1550   PHURINE 7.0 02/06/2019 1550   GLUCOSEU NEGATIVE 02/06/2019 1550   HGBUR NEGATIVE 02/06/2019 1550   HGBUR trace-intact 10/12/2008 1058   BILIRUBINUR SMALL (A) 02/06/2019 1550   BILIRUBINUR negative 09/21/2016 0911   KETONESUR 20 (A) 02/06/2019 1550   PROTEINUR 30 (A) 02/06/2019 1550   UROBILINOGEN 0.2 09/21/2016 0911   UROBILINOGEN 0.2 10/12/2008 1058   NITRITE POSITIVE (A) 02/06/2019 1550   LEUKOCYTESUR NEGATIVE 02/06/2019 1550   Sepsis Labs: Invalid input(s): PROCALCITONIN, LACTICIDVEN  No results found for this or any previous visit (from the past 240 hour(s)).    Radiology Studies: No results found.  Marzetta Board, MD, PhD [facility]  Contact via  [email or web address]  [place] P: [phone] F: [phone]

## 2019-04-03 DIAGNOSIS — R110 Nausea: Secondary | ICD-10-CM

## 2019-04-03 DIAGNOSIS — J9601 Acute respiratory failure with hypoxia: Secondary | ICD-10-CM

## 2019-04-03 LAB — BASIC METABOLIC PANEL
Anion gap: 9 (ref 5–15)
BUN: 13 mg/dL (ref 6–20)
CO2: 22 mmol/L (ref 22–32)
Calcium: 9 mg/dL (ref 8.9–10.3)
Chloride: 102 mmol/L (ref 98–111)
Creatinine, Ser: 0.57 mg/dL (ref 0.44–1.00)
GFR calc Af Amer: >60 mL/min (ref >60)
GFR calc non Af Amer: >60 mL/min (ref >60)
Glucose, Bld: 107 mg/dL — ABNORMAL HIGH (ref 70–99)
Potassium: 4.2 mmol/L (ref 3.5–5.1)
Sodium: 133 mmol/L — ABNORMAL LOW (ref 135–145)

## 2019-04-03 LAB — CBC
HCT: 38.8 % (ref 36.0–46.0)
Hemoglobin: 12.2 g/dL (ref 12.0–15.0)
MCH: 28 pg (ref 26.0–34.0)
MCHC: 31.4 g/dL (ref 30.0–36.0)
MCV: 89 fL (ref 80.0–100.0)
Platelets: 327 10*3/uL (ref 150–400)
RBC: 4.36 MIL/uL (ref 3.87–5.11)
RDW: 14.9 % (ref 11.5–15.5)
WBC: 8.7 10*3/uL (ref 4.0–10.5)
nRBC: 0 % (ref 0.0–0.2)

## 2019-04-03 NOTE — Progress Notes (Signed)
PROGRESS NOTE  Kelsey Bullock [id number] DOB: 02-Nov-1960 [name]: 03/18/2019 PCP: [name], MD   LOS: 16 days   Brief Narrative / Interim history: Kelsey Bullock is a 58 y.o. female with a history of morbid obesity who was admitted following surgery for a poorly healing infraumbilical surgical wound. She had initially had an ex lap with small bowel resection for incarcerated ventral hernia on 02/02/2019. Slow wound healing, though discharged with drain removed on 9/8, having her sister perform wet-to-dry dressing changes. Due to continued pain at the site and failure of the wound to close, CT was performed 10/9 and showed 6x4x4cm collection of gas and debris in the subcutaneous fat at the site with deeper collection felt to be an area of infection at the level of the fascia, possibly tracking behind the rectus sheath. She was scheduled for surgery. General surgery, Dr. Georgette Dover, performed wound exploration with wound vac placement (3 x 6 x 12cm deep) on 10/13. Small gaps in the fascial closure were noted and necrotic fascia was sharply debrided prior to vac placement. Though pre-surgical covid testing was negative, this was repeated when her sister was diagnosed with covid-19. SARS-CoV-2 PCR collected 10/14 was positive on 10/16, albeit with no CXR infiltrates or respiratory symptoms. This made discharge with wound vac impossible as home health was reportedly unable to accommodate covid-positive patients and the patient was expected to require at least another week of vac care, so she was transferred to [facility] 10/16.  General surgery following, potential plans for wound VAC removal early next week  Subjective / 24h Interval events: -complains of nausea since last night. This is mainly related to food quality per patient. She denies nausea/vomiting with supplements and crackers but mainly with the cooked hospital food  Assessment & Plan: Principal Problem:   Chronic abdominal wound  infection Active Problems:   HYPERCHOLESTEROLEMIA   Morbid obesity ([place])   Anxiety state   HYPERTENSION, BENIGN SYSTEMIC   Asthma   Controlled type 2 diabetes mellitus with neuropathy (HCC)   GERD (gastroesophageal reflux disease)   Diastolic CHF with preserved left ventricular function, NYHA class 2 ([place])   COVID-19 virus infection   Constipation   Acute respiratory failure with hypoxia (HCC)   Principal Problem Chronic abdominal wound infection  -Initially had ex lap with small bowel resection for incarcerated ventral hernia on 02/02/2019, now s/p wound exploration with wound vac placement (3 x 6 x 12cm deep) 10/13 by Dr. Georgette Dover.  -Will require ongoing wound vac changes/care prior to discharge. Unable to DC to SNF due to pt unable to give up check. General surgery to follow and, along with [place] team, care for wound. Wound vac change scheduled MWF per surgery. -to be re-evaluated by surgery tomorrow 10/30  Active Problems Acute hypoxic respiratory failure due to covid-19 pneumonia  -decreasing inflammatory markers and hypoxia resolved, on room air currently -Completed remdesivir 10/18 - 10/22 -Will attempt to avoid steroids given problems with wound healing. -Avoid NSAIDs if able -Incentive spirometry encouraged, OOB as able.   COVID-19 Labs  No results for input(s): DDIMER, FERRITIN, LDH, CRP in the last 72 hours.  Lab Results  Component Value Date   [place] (A) 03/19/2019   [place] NOT DETECTED 03/14/2019   [place] NEGATIVE 02/02/2019   Bilateral LE pain -appreciates pain in her calves and swelling, right > left -obtained doppler US, this is negative for DVT  Nausea -reported since yesterday but patient actually tells me that is has been throughout  her entire stay and it is related to the food quality -has been having BMs -no abdominal pain -clinically doubt ileus as she is able to eat crackers, liquid supplements  T2DM -Last HbA1c (6.1%) shows  this is well controlled without medications. CBGs controlled  Constipation  -bas hx post-op ileus. Bowel regimen -has been having BMs  Obesity  -BMI 59  Chronic HFpEF  -Clinical volume status is difficult, but roughly appears euvolemic and able to take po. Enlarged cardiac silhouette noted on CXR 10/16 on AP projection.  -Continue daily lasix, lisinopril. Metabolic panel stable. -Follow I/O   DUB -Chronic, quiescent.  -Continue norethindrone qHS with lovenox at as below.  Hyperlipidemia  -Continue statin  Anxiety -Continue home clonazepam  Asthma -Continue singulair, dulera, prn albuterol. Also on omalizumab per [facility] not administering while inaptient.  -Avoid nebulized therapy while at high risk of transmission.  -Without wheezing, do not feel steroids would be helpful for this indication.  GERD -Continue pepcid  Hemorrhoids -Topical therapies. -Sitz baths prn  Scheduled Meds: . atorvastatin  40 mg Oral QHS  . budesonide  1 puff Inhalation BID  . docusate sodium  200 mg Oral BID  . enoxaparin (LOVENOX) injection  0.5 mg/kg Subcutaneous Q24H  . erythromycin  1 application Both Eyes QHS  . famotidine  20 mg Oral BID  . feeding supplement (ENSURE ENLIVE)  237 mL Oral TID BM  . fluticasone  2 spray Each Nare Daily  . furosemide  20 mg Oral Daily  . lisinopril  40 mg Oral Daily  . mometasone-formoterol  2 puff Inhalation BID  . montelukast  10 mg Oral QHS  . multivitamin with minerals  1 tablet Oral Daily  . norethindrone  2.5 mg Oral QHS  . olopatadine  1 drop Both Eyes QHS  . polyethylene glycol  17 g Oral BID  . senna  2 tablet Oral BID  . vitamin C  1,000 mg Oral Daily  . zinc sulfate  220 mg Oral Daily   Continuous Infusions: PRN Meds:.acetaminophen **OR** acetaminophen, albuterol, bisacodyl, clonazePAM, cyclobenzaprine, diphenhydrAMINE **OR** diphenhydrAMINE, hydrocortisone, morphine injection, nystatin, oxyCODONE, oxymetazoline, prochlorperazine,  traMADol, witch hazel-glycerin  DVT prophylaxis: Lovenox Code Status: Full code Family Communication: d/w patient  Disposition Plan: home when no longer needs wound vac per surgery   Consultants:  General surgery   Procedures:  Abdominal wound exploration, placement of wound VAC 3 x 6 x 12 cm deep Surgeon: Kelsey Bullock  Microbiology  None  Antimicrobials: Vanc x 1 10/13 Remdesivir 10/18 >> 10/22  Objective: Vitals:   04/02/19 1931 04/02/19 2343 04/03/19 0343 04/03/19 0724  BP: 121/64 110/62 117/65 123/67  Pulse: 61 67 63 78  Resp: 16  11 13   Temp: (!) 97.5 F (36.4 C) 97.6 F (36.4 C) 98.1 F (36.7 C) 98.1 F (36.7 C)  TempSrc: Oral Oral Oral Oral  SpO2:  96%  98%  Weight:      Height:        Intake/Output Summary (Last 24 hours) at 04/03/2019 0920 Last data filed at 04/03/2019 0800 Gross per 24 hour  Intake 1060 ml  Output 500 ml  Net 560 ml   Filed Weights   03/19/19 2100 03/21/19 2206  Weight: (!) 153.6 kg (!) 156.3 kg    Examination:  Constitutional: NAD, calm, comfortable Eyes: lids and conjunctivae normal ENMT: Mucous membranes are moist.  Neck: normal, supple Respiratory: clear to auscultation bilaterally, no wheezing, no crackles. Normal respiratory effort.  Cardiovascular: Regular rate  and rhythm, no murmurs / rubs / gallops. No LE edema. 2+ pedal pulses.  Abdomen: no tenderness. Bowel sounds positive. Wound vac in place Skin: no rashes  Data Reviewed: I have independently reviewed following labs and imaging studies   CBC: Recent Labs  Lab 03/28/19 0037 03/30/19 0047 04/03/19 0005  WBC 5.5 7.2 8.7  NEUTROABS 2.9  --   --   HGB 11.3* 11.7* 12.2  HCT 36.2 37.5 38.8  MCV 88.3 88.9 89.0  PLT 184 257 [id number]   Basic Metabolic Panel: Recent Labs  Lab 03/28/19 0037 03/30/19 0047 04/03/19 0005  NA 136 137 133*  K 3.8 4.4 4.2  CL 103 104 102  CO2 24 24 22   GLUCOSE 106* 138* 107*  BUN 13 14 13   CREATININE 0.54 0.64 0.57  CALCIUM  8.5* 8.7* 9.0   GFR: Estimated Creatinine Clearance: 121.9 mL/min (by C-G formula based on SCr of 0.57 mg/dL). Liver Function Tests: Recent Labs  Lab 03/28/19 0037 03/30/19 0047  AST 30 41  ALT 30 43  ALKPHOS 65 70  BILITOT 1.0 0.8  PROT 6.9 7.0  ALBUMIN 3.3* 3.4*   No results for input(s): LIPASE, AMYLASE in the last 168 hours. No results for input(s): AMMONIA in the last 168 hours. Coagulation Profile: No results for input(s): INR, PROTIME in the last 168 hours. Cardiac Enzymes: No results for input(s): CKTOTAL, CKMB, CKMBINDEX, TROPONINI in the last 168 hours. BNP (last 3 results) No results for input(s): PROBNP in the last 8760 hours. HbA1C: No results for input(s): HGBA1C in the last 72 hours. CBG: No results for input(s): GLUCAP in the last 168 hours. Lipid Profile: No results for input(s): CHOL, HDL, LDLCALC, TRIG, CHOLHDL, LDLDIRECT in the last 72 hours. Thyroid Function Tests: No results for input(s): TSH, T4TOTAL, FREET4, T3FREE, THYROIDAB in the last 72 hours. Anemia Panel: No results for input(s): VITAMINB12, FOLATE, FERRITIN, TIBC, IRON, RETICCTPCT in the last 72 hours. Urine analysis:    Component Value Date/Time   COLORURINE AMBER (A) 02/06/2019 1550   APPEARANCEUR HAZY (A) 02/06/2019 1550   LABSPEC 1.025 02/06/2019 1550   PHURINE 7.0 02/06/2019 1550   GLUCOSEU NEGATIVE 02/06/2019 1550   HGBUR NEGATIVE 02/06/2019 1550   HGBUR trace-intact 10/12/2008 1058   BILIRUBINUR SMALL (A) 02/06/2019 1550   BILIRUBINUR negative 09/21/2016 0911   KETONESUR 20 (A) 02/06/2019 1550   PROTEINUR 30 (A) 02/06/2019 1550   UROBILINOGEN 0.2 09/21/2016 0911   UROBILINOGEN 0.2 10/12/2008 1058   NITRITE POSITIVE (A) 02/06/2019 1550   LEUKOCYTESUR NEGATIVE 02/06/2019 1550   Sepsis Labs: Invalid input(s): PROCALCITONIN, LACTICIDVEN  No results found for this or any previous visit (from the past 240 hour(s)).    Radiology Studies: Vas [place] Lower Extremity Venous  (dvt)  Result Date: 04/02/2019  Lower Venous Study Indications: Pain and swelling in left calf. Covid Positive.  Limitations: Body habitus. Comparison Study: No priors. Performing Technologist: Kelsey Bullock RDMS, RVT  Examination Guidelines: A complete evaluation includes B-mode imaging, spectral Doppler, color Doppler, and power Doppler as needed of all accessible portions of each vessel. Bilateral testing is considered an integral part of a complete examination. Limited examinations for reoccurring indications may be performed as noted.  +---------+---------------+---------+-----------+----------+--------------+ RIGHT    CompressibilityPhasicitySpontaneityPropertiesThrombus Aging +---------+---------------+---------+-----------+----------+--------------+ CFV      Full           Yes      Yes                                 +---------+---------------+---------+-----------+----------+--------------+  SFJ      Full                                                        +---------+---------------+---------+-----------+----------+--------------+ FV Prox  Full                                                        +---------+---------------+---------+-----------+----------+--------------+ FV Mid   Full                                                        +---------+---------------+---------+-----------+----------+--------------+ FV DistalFull                                                        +---------+---------------+---------+-----------+----------+--------------+ PFV      Full                                                        +---------+---------------+---------+-----------+----------+--------------+ POP      Full           Yes      Yes                                 +---------+---------------+---------+-----------+----------+--------------+ PTV      Full                                                         +---------+---------------+---------+-----------+----------+--------------+ PERO     Full                                                        +---------+---------------+---------+-----------+----------+--------------+   +---------+---------------+---------+-----------+----------+--------------+ LEFT     CompressibilityPhasicitySpontaneityPropertiesThrombus Aging +---------+---------------+---------+-----------+----------+--------------+ CFV      Full           Yes      Yes                                 +---------+---------------+---------+-----------+----------+--------------+ SFJ      Full                                                        +---------+---------------+---------+-----------+----------+--------------+  FV Prox  Full                                                        +---------+---------------+---------+-----------+----------+--------------+ FV Mid   Full                                                        +---------+---------------+---------+-----------+----------+--------------+ FV DistalFull                                                        +---------+---------------+---------+-----------+----------+--------------+ PFV      Full                                                        +---------+---------------+---------+-----------+----------+--------------+ POP      Full           Yes      Yes                                 +---------+---------------+---------+-----------+----------+--------------+ PTV      Full                                                        +---------+---------------+---------+-----------+----------+--------------+ PERO     Full                                                        +---------+---------------+---------+-----------+----------+--------------+     Summary: Right: There is no evidence of deep vein thrombosis in the lower extremity. No cystic structure found in the  popliteal fossa. Left: There is no evidence of deep vein thrombosis in the lower extremity. No cystic structure found in the popliteal fossa.  *See table(s) above for measurements and observations.    Preliminary     Marzetta Board, MD, PhD [facility]  Contact via  [email or web address]  [place] P: [phone] F: [phone]

## 2019-04-03 NOTE — Progress Notes (Signed)
Called pt's daughter Jenetta Downer, no answer

## 2019-04-04 NOTE — Discharge Summary (Addendum)
Physician Discharge Summary  Kelsey Bullock [id number] DOB: 01-Feb-1961 [name]: 03/18/2019  PCP: [name], MD  Admit date: 03/18/2019 Discharge date: 04/04/2019  Admitted From: home Disposition:  home  Recommendations for Outpatient Follow-up:  1. Follow up with PCP in 1-2 weeks 2. Follow-up with general surgery as an outpatient as scheduled by surgical team  Home Health: none Equipment/Devices: none  Discharge Condition: stable CODE STATUS: Full code Diet recommendation: regular  HPI: Per admitting MD, Kelsey Bullock is a 58 y.o. female with a history of morbid obesity who was admitted following surgery for a poorly healing infraumbilical surgical wound. She had initially had an ex lap with small bowel resection for incarcerated ventral hernia on 02/02/2019. Slow wound healing, though discharged with drain removed on 9/8, having her sister perform wet-to-dry dressing changes. Due to continued pain at the site and failure of the wound to close, CT was performed 10/9 and showed 6x4x4cm collection of gas and debris in the subcutaneous fat at the site with deeper collection felt to be an area of infection at the level of the fascia, possibly tracking behind the rectus sheath. She was scheduled for surgery. General surgery, Dr. Georgette Dover, performed wound exploration with wound vac placement (3 x 6 x 12cm deep) on 10/13. Small gaps in the fascial closure were noted and necrotic fascia was sharply debrided prior to vac placement. Though pre-surgical covid testing was negative, this was repeated when her sister was diagnosed with covid-19. SARS-CoV-2 PCR collected 10/14 was positive on 10/16. This made discharge with wound vac impossible as home health was reportedly unable to accommodate covid-positive patients and the patient was expected to require at least another week of vac care, so she was transferred to [facility] 10/16.   Hospital Course:  Principal Problem Chronic abdominal wound  infection -Initially had ex lap with small bowel resection for incarcerated ventral hernia on 02/02/2019, now s/p wound exploration with wound vac placement (3 x 6 x 12cm deep) 10/13 by Dr. Georgette Dover.  She was managed by general surgery team while hospitalized, eventually wound VAC was discontinued on 04/04/2019.  At this point patient was cleared by surgical team to be discharged home with dressing changes twice daily and she will have follow-up as an outpatient in their office scheduled in a month  Active Problems Acute hypoxic respiratory failure due to covid-19 pneumonia -patient also tested positive for COVID-19, she had pneumonia on chest x-ray, hypoxia, and was treated with remdesivir 10/18-10/22.  Steroids were avoided to delay wound healing.  She has improved, she was able to be weaned off to room air and will be discharged home in stable condition Bilateral LE pain -appreciates pain in her calves and swelling, right > left, [place] negative for DVT.  Symptomatic management Nausea -reported throughout hospital stays, mainly related to food.  No significant vomiting, moving bowels well, no clinical concerns for ileus or small bowel obstruction T2DM -Last HbA1c (6.1%) shows this is well controlled without medications. CBGs controlled Constipation -resolved Obesity -BMI 59 Chronic HFpEF  -Clinical volume status is difficult, but roughly appears euvolemic and able to take po.  Continue home medications DUB -Chronic, quiescent. Continue norethindrone qHS with lovenox at as below. Hyperlipidemia  -Continue statin Anxiety -Continue home medications Asthma -Continue home medications  Discharge Diagnoses:  Principal Problem:   Chronic abdominal wound infection Active Problems:   HYPERCHOLESTEROLEMIA   Morbid obesity ([place])   Anxiety state   HYPERTENSION, BENIGN SYSTEMIC   Asthma   Controlled  type 2 diabetes mellitus with neuropathy (HCC)   GERD (gastroesophageal reflux disease)   Diastolic CHF with  preserved left ventricular function, NYHA class 2 ([place])   COVID-19 virus infection   Constipation   Acute respiratory failure with hypoxia [facility])  Discharge Instructions  Allergies as of 04/04/2019      Reactions   Amoxicillin Anaphylaxis, Swelling   Etodolac Anaphylaxis, Shortness Of Breath    lips swelled   Penicillins Anaphylaxis, Swelling   DID THE REACTION INVOLVE: Swelling of the face/tongue/throat, SOB, or low BP? Yes Sudden or severe rash/hives, skin peeling, or the inside of the mouth or nose? No Did it require medical treatment? No When did it last happen?20 years ago If all above answers are "NO", may proceed with cephalosporin use.   Ampicillin Swelling   Augmentin [amoxicillin-pot Clavulanate] Swelling   Diclofenac Sodium    Unknown reaction    Pantoprazole Sodium    unspecified   Adhesive [tape] Rash      Medication List    STOP taking these medications   fluconazole 100 MG tablet Commonly known as: DIFLUCAN     TAKE these medications   acetaminophen 500 MG tablet Commonly known as: TYLENOL Take 1,000 mg by mouth 2 (two) times daily as needed for moderate pain or headache.   acetaminophen 325 MG tablet Commonly known as: TYLENOL Take 2 tablets (650 mg total) by mouth every 6 (six) hours as needed for mild pain, fever or headache.   albuterol (2.5 MG/3ML) 0.083% nebulizer solution Commonly known as: PROVENTIL USE 1 VIAL VIA NEBULIZER EVERY 4 TO 6 HOURS AS NEEDED FOR COUGH OR WHEEZING What changed:   how much to take  how to take this  when to take this  reasons to take this  additional instructions   albuterol 108 (90 Base) MCG/ACT inhaler Commonly known as: VENTOLIN HFA INHALE 2 PUFFS INTO THE LUNGS EVERY 4 HOURS AS NEEDED FOR WHEEZING What changed: See the new instructions.   atorvastatin 40 MG tablet Commonly known as: LIPITOR Take 1 tablet (40 mg total) by mouth at bedtime.   beclomethasone 80 MCG/ACT inhaler Commonly known as:  Qvar INHALE 2 PUFFS BY MOUTH INTO THE LUNGS TWICE DAILY What changed:   how much to take  how to take this  when to take this  reasons to take this  additional instructions   calcium carbonate 500 MG chewable tablet Commonly known as: TUMS - dosed in mg elemental calcium Chew 2-3 tablets by mouth daily as needed for indigestion or heartburn.   CINNAMON PO Take 1 tablet by mouth daily as needed (for high bp).   clonazePAM 1 MG tablet Commonly known as: KLONOPIN Take 1 mg by mouth 2 (two) times daily as needed for anxiety.   Cranberry 1000 MG Caps Take 1,000 mg by mouth daily.   cyclobenzaprine 10 MG tablet Commonly known as: FLEXERIL Take 10 mg by mouth at bedtime as needed for muscle spasms.   Dulera 200-5 MCG/ACT Aero Generic drug: mometasone-formoterol Inhale 2 puffs into the lungs 2 (two) times daily.   EPINEPHrine 0.3 mg/0.3 mL Soaj injection Commonly known as: EpiPen 2-Pak Inject 0.3 mLs (0.3 mg total) into the muscle once for 1 dose. What changed:   when to take this  reasons to take this   erythromycin ophthalmic ointment Place 1 application into both eyes at bedtime.   famotidine 20 MG tablet Commonly known as: PEPCID Take 1 tablet (20 mg total) by mouth 2 (two) times  daily.   fluticasone 50 MCG/ACT nasal spray Commonly known as: FLONASE USE 2 SPRAYS IN EACH NOSTRIL ONCE DAILY FOR STUFFY NOSE OR DRAINAGE What changed:   how much to take  how to take this  when to take this  reasons to take this  additional instructions   furosemide 40 MG tablet Commonly known as: LASIX Take 1 tablet (40 mg total) by mouth every other day. TAKE 1 TABLET(40 MG) BY MOUTH TWICE DAILY What changed: additional instructions   lisinopril 40 MG tablet Commonly known as: ZESTRIL TAKE 1 TABLET BY MOUTH EVERY DAY   montelukast 10 MG tablet Commonly known as: SINGULAIR TAKE 1 TABLET BY MOUTH AT BEDTIME What changed:   how much to take  how to take  this  when to take this  additional instructions   norethindrone 5 MG tablet Commonly known as: AYGESTIN Take 2.5 mg by mouth at bedtime.   nystatin powder Commonly known as: MYCOSTATIN/NYSTOP Apply topically 3 (three) times daily. What changed:   how much to take  when to take this  reasons to take this   oxyCODONE 5 MG immediate release tablet Commonly known as: Oxy IR/ROXICODONE Take 1 tablet (5 mg total) by mouth every 6 (six) hours as needed for moderate pain.   oxymetazoline 0.05 % nasal spray Commonly known as: AFRIN Place 1 spray into both nostrils 2 (two) times daily as needed for congestion.   Pazeo 0.7 % Soln Generic drug: Olopatadine HCl Place 1 drop into both eyes at bedtime.   vitamin C 1000 MG tablet Take 1,000 mg by mouth daily.   Xolair 150 MG injection Generic drug: omalizumab INJECT 300 MG UNDER THE SKIN EVERY 28 DAYS. What changed: See the new instructions.   Zinc Lozg Take 1 lozenge by mouth daily.            Durable Medical Equipment  (From admission, onward)         Start     Ordered   03/19/19 1041  For home use only DME Other see comment  Once    Comments: Recliner lift chair  Question:  Length of Need  Answer:  Lifetime   03/19/19 1042         Follow-up Information    Donnie Mesa, MD. Go on 05/05/2019.   Specialty: General Surgery Why: Your appointment is 11/30 at 10:30am Please arrive 15 minutes prior to your appointment to check in.  Be sure that you are asymptomatic, afebrile, and wear a mask to come to your appointment. Contact information: [place]  [place] [address] [phone]        [name], MD. Schedule an appointment as soon as possible for a visit in 2 week(s).   Specialty: Family Medicine Contact information: [place] [address] [phone]           Consultations:  General surgery   Procedures/Studies:  Ct Abdomen Pelvis W  Contrast  Result Date: 03/14/2019 CLINICAL DATA:  Abdominal pain. Incarcerated ventral hernia with small bowel obstruction. Status post Exploratory laparotomy, small bowel resection, primary repair of ventral hernia on 02/03/2019. EXAM: CT ABDOMEN AND PELVIS WITH CONTRAST TECHNIQUE: Multidetector CT imaging of the abdomen and pelvis was performed using the standard protocol following bolus administration of intravenous contrast. CONTRAST:  144mL OMNIPAQUE IOHEXOL 300 MG/ML  SOLN COMPARISON:  02/02/2019 FINDINGS: Lower chest: Unremarkable. Hepatobiliary: No suspicious focal abnormality within the liver parenchyma. Liver measures 20.3 cm craniocaudal length,  enlarged. Tiny layering gallstones evident (axial image 35/series 3). No intrahepatic or extrahepatic biliary dilation. Pancreas: No focal mass lesion. No dilatation of the main duct. No intraparenchymal cyst. No peripancreatic edema. Spleen: No splenomegaly. No focal mass lesion. Adrenals/Urinary Tract: No adrenal nodule or mass. 12 mm low-density lesion upper pole right kidney is stable and likely a cyst. Left kidney unremarkable. No evidence for hydroureter. The urinary bladder appears normal for the degree of distention. Stomach/Bowel: Stomach is unremarkable. No gastric wall thickening. No evidence of outlet obstruction. Duodenum is normally positioned as is the ligament of Treitz. No small bowel wall thickening. No small bowel dilatation. The terminal ileum is normal. The appendix is normal. No gross colonic mass. No colonic wall thickening. Vascular/Lymphatic: There is abdominal aortic atherosclerosis without aneurysm. There is no gastrohepatic or hepatoduodenal ligament lymphadenopathy. No intraperitoneal or retroperitoneal lymphadenopathy. No pelvic sidewall lymphadenopathy. Reproductive: The uterus is unremarkable.  There is no adnexal mass. Other: No intraperitoneal free fluid. Musculoskeletal: 6.1 x 3.9 x 4.4 cm collection of gas and debris is  identified in the deep subcutaneous fat along the midline and right paramidline rectus fascia, just caudal to the umbilicus. This is compatible with the patient's known wound and is at the site of the ventral hernia seen on the prior study. The rectus sheath is ill-defined in this region compatible with the recent surgery and open wound. There is extension of focal fat density deep to the rectus sheath that has a encasing rim of ill-defined soft tissue attenuation measuring 4.7 x 3.9 x 3.8 cm. There are some gas bubbles in this apparently loculated fat deep to the hernia repair site. Given that the patient is more than a month out from surgery, residual gas from the operation would not be expected. This process may be in the preperitoneal fat superficial to the peritoneum, but extension through the peritoneal lining in the omentum cannot be completely excluded. There is no associated fluid collection to suggest drainable abscess. IMPRESSION: 1. 6 x 4 x 4 cm collection of gas and debris identified in the deep subcutaneous fat of the lower anterior abdominal wall at the site of hernia repair. This is compatible with the patient's known open wound. Just deep to this wound and deep to the rectus compartment, there is a focus of loculated or contained fat density with a rim of ill-defined soft tissue attenuation and several gas locules. This process tracks cranially behind the rectus sheath and is probably in the preperitoneal space, but extension through the peritoneum into the anterior peritoneal cavity/omentum cannot be excluded by CT. There is no associated fluid collection to suggest drainable abscess. Gas would be unexpected given the patient is more than 1 month out from surgery and communication to the superficial wound would be a primary consideration. Superinfection of this region could also generate gas. 2. Hepatomegaly 3. Cholelithiasis. Electronically Signed   By: Misty Stanley M.D.   On: 03/14/2019 07:51    Dg Chest Port 1 View  Result Date: 03/23/2019 CLINICAL DATA:  Hypoxia, COVID positive EXAM: PORTABLE CHEST 1 VIEW COMPARISON:  03/21/2019 FINDINGS: Mild linear/patchy opacities in the right upper lobe, left upper lobe/lingula, and left lower lobe. No pleural effusion or pneumothorax. Cardiomegaly. IMPRESSION: Mild scattered patchy opacities, compatible with pneumonia in this patient with known COVID. Electronically Signed   By: Julian Hy M.D.   On: 03/23/2019 09:34   Dg Chest Port 1 View  Result Date: 03/21/2019 CLINICAL DATA:  Febrile EXAM: PORTABLE CHEST 1 VIEW  COMPARISON:  02/06/2019 FINDINGS: Cardiomegaly. Both lungs are clear. The visualized skeletal structures are unremarkable. IMPRESSION: Cardiomegaly without acute abnormality of the lungs in AP portable projection. Electronically Signed   By: Eddie Candle M.D.   On: 03/21/2019 14:51   Vas [place] Lower Extremity Venous (dvt)  Result Date: 04/03/2019  Lower Venous Study Indications: Pain and swelling in left calf. Covid Positive.  Limitations: Body habitus. Comparison Study: No priors. Performing Technologist: Oda Cogan RDMS, RVT  Examination Guidelines: A complete evaluation includes B-mode imaging, spectral Doppler, color Doppler, and power Doppler as needed of all accessible portions of each vessel. Bilateral testing is considered an integral part of a complete examination. Limited examinations for reoccurring indications may be performed as noted.  +---------+---------------+---------+-----------+----------+--------------+ RIGHT    CompressibilityPhasicitySpontaneityPropertiesThrombus Aging +---------+---------------+---------+-----------+----------+--------------+ CFV      Full           Yes      Yes                                 +---------+---------------+---------+-----------+----------+--------------+ SFJ      Full                                                         +---------+---------------+---------+-----------+----------+--------------+ FV Prox  Full                                                        +---------+---------------+---------+-----------+----------+--------------+ FV Mid   Full                                                        +---------+---------------+---------+-----------+----------+--------------+ FV DistalFull                                                        +---------+---------------+---------+-----------+----------+--------------+ PFV      Full                                                        +---------+---------------+---------+-----------+----------+--------------+ POP      Full           Yes      Yes                                 +---------+---------------+---------+-----------+----------+--------------+ PTV      Full                                                        +---------+---------------+---------+-----------+----------+--------------+  PERO     Full                                                        +---------+---------------+---------+-----------+----------+--------------+   +---------+---------------+---------+-----------+----------+--------------+ LEFT     CompressibilityPhasicitySpontaneityPropertiesThrombus Aging +---------+---------------+---------+-----------+----------+--------------+ CFV      Full           Yes      Yes                                 +---------+---------------+---------+-----------+----------+--------------+ SFJ      Full                                                        +---------+---------------+---------+-----------+----------+--------------+ FV Prox  Full                                                        +---------+---------------+---------+-----------+----------+--------------+ FV Mid   Full                                                         +---------+---------------+---------+-----------+----------+--------------+ FV DistalFull                                                        +---------+---------------+---------+-----------+----------+--------------+ PFV      Full                                                        +---------+---------------+---------+-----------+----------+--------------+ POP      Full           Yes      Yes                                 +---------+---------------+---------+-----------+----------+--------------+ PTV      Full                                                        +---------+---------------+---------+-----------+----------+--------------+ PERO     Full                                                        +---------+---------------+---------+-----------+----------+--------------+  Summary: Right: There is no evidence of deep vein thrombosis in the lower extremity. No cystic structure found in the popliteal fossa. Left: There is no evidence of deep vein thrombosis in the lower extremity. No cystic structure found in the popliteal fossa.  *See table(s) above for measurements and observations. Electronically signed by Servando Snare MD on 04/03/2019 at 4:29:07 PM.    Final      Subjective: - no chest pain, shortness of breath, no abdominal pain, nausea or vomiting.   Discharge Exam: BP 114/69   Pulse 76   Temp 99 F (37.2 C) (Oral)   Resp 15   Ht 5\' 7"  (1.702 m)   Wt (!) 156.3 kg   LMP 02/03/2019   SpO2 96%   BMI 53.97 kg/m   General: Pt is alert, awake, not in acute distress Cardiovascular: RRR, S1/S2 +, no rubs, no gallops Respiratory: CTA bilaterally, no wheezing, no rhonchi Abdominal: Soft, NT, ND, bowel sounds + Extremities: no edema, no cyanosis    The results of significant diagnostics from this hospitalization (including imaging, microbiology, ancillary and laboratory) are listed below for reference.     Microbiology: No results found  for this or any previous visit (from the past 240 hour(s)).   Labs: BNP (last 3 results) Recent Labs    02/06/19 0354  BNP [id number]   Basic Metabolic Panel: Recent Labs  Lab 03/30/19 0047 04/03/19 0005  NA 137 133*  K 4.4 4.2  CL 104 102  CO2 24 22  GLUCOSE 138* 107*  BUN 14 13  CREATININE 0.64 0.57  CALCIUM 8.7* 9.0   Liver Function Tests: Recent Labs  Lab 03/30/19 0047  AST 41  ALT 43  ALKPHOS 70  BILITOT 0.8  PROT 7.0  ALBUMIN 3.4*   No results for input(s): LIPASE, AMYLASE in the last 168 hours. No results for input(s): AMMONIA in the last 168 hours. CBC: Recent Labs  Lab 03/30/19 0047 04/03/19 0005  WBC 7.2 8.7  HGB 11.7* 12.2  HCT 37.5 38.8  MCV 88.9 89.0  PLT 257 327   Cardiac Enzymes: No results for input(s): CKTOTAL, CKMB, CKMBINDEX, TROPONINI in the last 168 hours. BNP: Invalid input(s): POCBNP CBG: No results for input(s): GLUCAP in the last 168 hours. D-Dimer No results for input(s): DDIMER in the last 72 hours. Hgb A1c No results for input(s): HGBA1C in the last 72 hours. Lipid Profile No results for input(s): CHOL, HDL, LDLCALC, TRIG, CHOLHDL, LDLDIRECT in the last 72 hours. Thyroid function studies No results for input(s): TSH, T4TOTAL, T3FREE, THYROIDAB in the last 72 hours.  Invalid input(s): FREET3 Anemia work up No results for input(s): VITAMINB12, FOLATE, FERRITIN, TIBC, IRON, RETICCTPCT in the last 72 hours. Urinalysis    Component Value Date/Time   COLORURINE AMBER (A) 02/06/2019 1550   APPEARANCEUR HAZY (A) 02/06/2019 1550   LABSPEC 1.025 02/06/2019 1550   PHURINE 7.0 02/06/2019 1550   GLUCOSEU NEGATIVE 02/06/2019 1550   HGBUR NEGATIVE 02/06/2019 1550   HGBUR trace-intact 10/12/2008 1058   BILIRUBINUR SMALL (A) 02/06/2019 1550   BILIRUBINUR negative 09/21/2016 0911   KETONESUR 20 (A) 02/06/2019 1550   PROTEINUR 30 (A) 02/06/2019 1550   UROBILINOGEN 0.2 09/21/2016 0911   UROBILINOGEN 0.2 10/12/2008 1058   NITRITE  POSITIVE (A) 02/06/2019 1550   LEUKOCYTESUR NEGATIVE 02/06/2019 1550   Sepsis Labs Invalid input(s): PROCALCITONIN,  WBC,  LACTICIDVEN  FURTHER DISCHARGE INSTRUCTIONS:   Get Medicines reviewed and adjusted: Please take all your medications with you for  your next visit with your Primary MD   Laboratory/radiological data: Please request your Primary MD to go over all hospital tests and procedure/radiological results at the follow up, please ask your Primary MD to get all Hospital records sent to his/her office.   In some cases, they will be blood work, cultures and biopsy results pending at the time of your discharge. Please request that your primary care M.D. goes through all the records of your hospital data and follows up on these results.   Also Note the following: If you experience worsening of your admission symptoms, develop shortness of breath, life threatening emergency, suicidal or homicidal thoughts you must seek medical attention immediately by calling 911 or calling your MD immediately  if symptoms less severe.   You must read complete instructions/literature along with all the possible adverse reactions/side effects for all the Medicines you take and that have been prescribed to you. Take any new Medicines after you have completely understood and accpet all the possible adverse reactions/side effects.    Do not drive when taking Pain medications or sleeping medications (Benzodaizepines)   Do not take more than prescribed Pain, Sleep and Anxiety Medications. It is not advisable to combine anxiety,sleep and pain medications without talking with your primary care practitioner   Special Instructions: If you have smoked or chewed Tobacco  in the last 2 yrs please stop smoking, stop any regular Alcohol  and or any Recreational drug use.   Wear Seat belts while driving.   Please note: You were cared for by a hospitalist during your hospital stay. Once you are discharged, your  primary care physician will handle any further medical issues. Please note that NO REFILLS for any discharge medications will be authorized once you are discharged, as it is imperative that you return to your primary care physician (or establish a relationship with a primary care physician if you do not have one) for your post hospital discharge needs so that they can reassess your need for medications and monitor your lab values.  Time coordinating discharge: 35 minutes  SIGNED:  Marzetta Board, MD, PhD 04/04/2019, 11:30 AM

## 2019-04-04 NOTE — TOC Transition Note (Addendum)
Transition of Care [facility] - CM/SW Discharge Note   Patient Details  Name: Kelsey Bullock MRN: [id number] Date of Birth: 12-30-1960  Transition of Care [facility]) CM/SW Contact:  Ninfa Meeker, RN Phone Number: [phone] (working remotely) 04/04/2019, 1:08 PM   Clinical Narrative:  Patient has completed wound vac therapy and is ready for discharge. Case manager contacted Joen Laura, [facility] Liaison to confirm that they would be able to resume patient's [facility] services. Patient is being taught how to do her dressings as well.   1:32pm  Tiffany called to confirm that [facility] will resume patient' care. Case manager also received request to arrange a lift chair for patient. CM explained that we dont have access to lift chairs but advised that patient's family can take order to medical supply store to rent one, or since she wants it for long term they should consider purchasing one.     Final next level of care: [place] Barriers to Discharge: Barriers Resolved   Patient Goals and CMS Choice Patient states their goals for this hospitalization and ongoing recovery are:: to go home CMS Medicare.gov Compare Post Acute Care list provided to:: Patient Choice offered to / list presented to : Patient  Discharge Placement                       Discharge Plan and Services     Post Acute Care Choice: Home Health          DME Arranged: N/A         HH Arranged: RN, PT [place] Agency: [facility] (formerly [employer]) Date [place]: 04/04/19 Time [place]: 1307 Representative spoke with at [place]: [place] ([place]) Interventions     Readmission Risk Interventions No flowsheet data found.

## 2019-04-04 NOTE — Progress Notes (Signed)
Pt d/c with wound supplies, d/c instructions and lift chair prescription. D/c to home with home health. Sister picked pt up

## 2019-04-04 NOTE — Discharge Instructions (Addendum)
MIDLINE WOUND CARE: - midline dressing to be changed twice daily - supplies: sterile saline, kerlex, scissors, gauze, tape  - remove dressing and all packing carefully, moistening with sterile saline as needed to avoid packing/internal dressing sticking to the wound. - clean edges of skin around the wound with water/gauze, making sure there is no tape debris or leakage left on skin that could cause skin irritation or breakdown. - dampen and clean kerlex with sterile saline and pack wound from wound base to skin level, making sure to take note of any possible areas of wound tracking, tunneling and packing appropriately. Wound can be packed loosely. Trim kerlex to size if a whole kerlix is not required. - cover wound with a dry gauze and secure with tape.  - write the date/time on the dry dressing/tape to better track when the last dressing change occurred. - apply any skin protectant/powder recommended by clinician to protect skin/skin folds. - change dressing as needed if leakage occurs, wound gets contaminated, or patient requests to shower. - patient may shower daily with wound open and following the shower the wound should be dried and a clean dressing placed.    People who have recovered from the coronavirus, can help a person still fighting the virus and potentially help them recover by giving them convalescent plasma.  What is COVID-19 convalescent plasma? Convalescent plasma (CPP) is plasma collected from people who have recovered from the coronavirus. People who recover from coronavirus infection have developed antibodies to the virus that remain in the plasma portion of their blood. Transfusing the plasma that contains the antibodies into a person still fighting the virus can provide a boost to the patients immune system and potentially help them recover. The experimental treatment is approved by the FDA to be used on an emergency basis and is called COVID-19 convalescent plasma."  Critically ill patients who meet the FDA criteria to receive this therapy can be treated for life-threatening COVID-19.  Can I donate convalescent plasma or blood after being diagnosed with COVID-19? Donors must meet all the required screening criteria for blood donation and the additional FDA criteria, as follows:   Prior diagnosis of COVID-19 documented by an FDA approved laboratory test   A positive diagnostic test at the time of illness OR   A positive serological test for SARS-CoV-2 antibodies after recovery  Complete resolution of symptoms at least 14 days prior to donation and a documented negative COVID-19 FDA approved test OR   Complete resolution of symptoms at least 28 days prior to donation As a part of your pre-donation process you will be required to provide your COVID-19 test result(s).  Where can I donate COVID-19 convalescent plasma? You can complete the pre-donation form at [email or web address] and a donor specialist will be in contact within 24-48 hours.  What is the process for donating COVID-19 Convalescent Plasma?  1. The first step in the donation process is to obtain a copy of your test results or a letter from the testing facility notifying you of your positive result and the date it was taken 2. Complete the pre-donation form at [email or web address] 3. A representative will be in contact within 24-48 hours to provide additional insight into the process 4. Once the process begins, [facility] will ensure you   Meet the required screening criteria for blood donation   Have the required test results   Meet the criteria for resolution of your symptoms   Complete resolution of symptoms at least 14 days prior to donation  and a documented negative COVID-19 FDA approved test OR   Complete resolution of symptoms at least 28 days prior to donation 5. [facility] will then schedule a collection date and time to collect your COVID-19 convalescent plasma  Is the COVID-19  convalescent plasma donation safe? It is safe to donate COVID-19 convalescent plasma and done in the same way that thousands of blood donors donate blood products every day.  My family member or friend is being treated for COVID-19. Can I donate to help them if I am eligible? A directed donation will need to be arranged in conjunction with your family members care provider.  The care provider will determine whether COVID-19 convalescent plasma is the best course of treatment   A directed donation must be arranged through the Clinician Referral page that can be found at [email or web address]   The clinician will need to provide [facility] with information about both you and the patient for which the donation is directed   Please note there are additional qualifications that are required such as your blood type and the patients blood type We encourage you to consider beginning the pre-registration process even if your loved one will not be receiving COVID-19 convalescent plasma as a part of their medical treatment. The need for COVID-19 convalescent plasma increases daily and your donated COVID-19 convalescent plasma could be life-saving for another COVID-19 patient.  Please go to the website [email or web address] if you would like to consider volunteer for plasma donation.       Person Under Monitoring Name: Kelsey Bullock  Location: [id number] [address] [place] [address]   Infection Prevention Recommendations for Individuals Confirmed to have, or Being Evaluated for, 2019 Novel Coronavirus (COVID-19) Infection Who Receive Care at Home  Individuals who are confirmed to have, or are being evaluated for, COVID-19 should follow the prevention steps below until a healthcare provider or local or state health department says they can return to normal activities.  Stay home except to get medical care You should restrict activities outside your home, except for getting medical  care. Do not go to work, school, or public areas, and do not use public transportation or taxis.  Call ahead before visiting your doctor Before your medical appointment, call the healthcare provider and tell them that you have, or are being evaluated for, COVID-19 infection. This will help the healthcare providers office take steps to keep other people from getting infected. Ask your healthcare provider to call the local or state health department.  Monitor your symptoms Seek prompt medical attention if your illness is worsening (e.g., difficulty breathing). Before going to your medical appointment, call the healthcare provider and tell them that you have, or are being evaluated for, COVID-19 infection. Ask your healthcare provider to call the local or state health department.  Wear a facemask You should wear a facemask that covers your nose and mouth when you are in the same room with other people and when you visit a healthcare provider. People who live with or visit you should also wear a facemask while they are in the same room with you.  Separate yourself from other people in your home As much as possible, you should stay in a different room from other people in your home. Also, you should use a separate bathroom, if available.  Avoid sharing household items You should not share dishes, drinking glasses, cups, eating utensils, towels, bedding, or other items with other people in your home. After using these items,  you should wash them thoroughly with soap and water.  Cover your coughs and sneezes Cover your mouth and nose with a tissue when you cough or sneeze, or you can cough or sneeze into your sleeve. Throw used tissues in a lined trash can, and immediately wash your hands with soap and water for at least 20 seconds or use an alcohol-based hand rub.  Wash your [facility] your hands often and thoroughly with soap and water for at least 20 seconds. You can use an alcohol-based  hand sanitizer if soap and water are not available and if your hands are not visibly dirty. Avoid touching your eyes, nose, and mouth with unwashed hands.   Prevention Steps for Caregivers and Household Members of Individuals Confirmed to have, or Being Evaluated for, COVID-19 Infection Being Cared for in the Home  If you live with, or provide care at home for, a person confirmed to have, or being evaluated for, COVID-19 infection please follow these guidelines to prevent infection:  Follow healthcare providers instructions Make sure that you understand and can help the patient follow any healthcare provider instructions for all care.  Provide for the patients basic needs You should help the patient with basic needs in the home and provide support for getting groceries, prescriptions, and other personal needs.  Monitor the patients symptoms If they are getting sicker, call his or her medical provider and tell them that the patient has, or is being evaluated for, COVID-19 infection. This will help the healthcare providers office take steps to keep other people from getting infected. Ask the healthcare provider to call the local or state health department.  Limit the number of people who have contact with the patient  If possible, have only one caregiver for the patient.  Other household members should stay in another home or place of residence. If this is not possible, they should stay  in another room, or be separated from the patient as much as possible. Use a separate bathroom, if available.  Restrict visitors who do not have an essential need to be in the home.  Keep older adults, very young children, and other sick people away from the patient Keep older adults, very young children, and those who have compromised immune systems or chronic health conditions away from the patient. This includes people with chronic heart, lung, or kidney conditions, diabetes, and  cancer.  Ensure good ventilation Make sure that shared spaces in the home have good air flow, such as from an air conditioner or an opened window, weather permitting.  Wash your hands often  Wash your hands often and thoroughly with soap and water for at least 20 seconds. You can use an alcohol based hand sanitizer if soap and water are not available and if your hands are not visibly dirty.  Avoid touching your eyes, nose, and mouth with unwashed hands.  Use disposable paper towels to dry your hands. If not available, use dedicated cloth towels and replace them when they become wet.  Wear a facemask and gloves  Wear a disposable facemask at all times in the room and gloves when you touch or have contact with the patients blood, body fluids, and/or secretions or excretions, such as sweat, saliva, sputum, nasal mucus, vomit, urine, or feces.  Ensure the mask fits over your nose and mouth tightly, and do not touch it during use.  Throw out disposable facemasks and gloves after using them. Do not reuse.  Wash your hands immediately after removing  your facemask and gloves.  If your personal clothing becomes contaminated, carefully remove clothing and launder. Wash your hands after handling contaminated clothing.  Place all used disposable facemasks, gloves, and other waste in a lined container before disposing them with other household waste.  Remove gloves and wash your hands immediately after handling these items.  Do not share dishes, glasses, or other household items with the patient  Avoid sharing household items. You should not share dishes, drinking glasses, cups, eating utensils, towels, bedding, or other items with a patient who is confirmed to have, or being evaluated for, COVID-19 infection.  After the person uses these items, you should wash them thoroughly with soap and water.  Wash laundry thoroughly  Immediately remove and wash clothes or bedding that have blood, body  fluids, and/or secretions or excretions, such as sweat, saliva, sputum, nasal mucus, vomit, urine, or feces, on them.  Wear gloves when handling laundry from the patient.  Read and follow directions on labels of laundry or clothing items and detergent. In general, wash and dry with the warmest temperatures recommended on the label.  Clean all areas the individual has used often  Clean all touchable surfaces, such as counters, tabletops, doorknobs, bathroom fixtures, toilets, phones, keyboards, tablets, and bedside tables, every day. Also, clean any surfaces that may have blood, body fluids, and/or secretions or excretions on them.  Wear gloves when cleaning surfaces the patient has come in contact with.  Use a diluted bleach solution (e.g., dilute bleach with 1 part bleach and 10 parts water) or a household disinfectant with a label that says EPA-registered for coronaviruses. To make a bleach solution at home, add 1 tablespoon of bleach to 1 quart (4 cups) of water. For a larger supply, add  cup of bleach to 1 gallon (16 cups) of water.  Read labels of cleaning products and follow recommendations provided on product labels. Labels contain instructions for safe and effective use of the cleaning product including precautions you should take when applying the product, such as wearing gloves or eye protection and making sure you have good ventilation during use of the product.  Remove gloves and wash hands immediately after cleaning.  Monitor yourself for signs and symptoms of illness Caregivers and household members are considered close contacts, should monitor their health, and will be asked to limit movement outside of the home to the extent possible. Follow the monitoring steps for close contacts listed on the symptom monitoring form.   ? If you have additional questions, contact your local health department or call the epidemiologist on call at [phone] (available 24/7). ? This  guidance is subject to change. For the most up-to-date guidance from [facility], please refer to their website: [email or web address]

## 2019-04-04 NOTE — Progress Notes (Signed)
[facility] Progress Note  17 Days Post-Op  Subjective: CC-  No new complaints. Doing well with vac changes.  Objective: Vital signs in last 24 hours: Temp:  [97.8 F (36.6 C)-99 F (37.2 C)] 99 F (37.2 C) (10/30 0808) Pulse Rate:  [59-76] 76 (10/30 0808) Resp:  [13-20] 15 (10/30 0808) BP: (106-125)/(56-69) 114/69 (10/30 1027) SpO2:  [95 %-97 %] 96 % (10/30 0832) Last BM Date: 04/04/19  Intake/Output from previous day: 10/29 0701 - 10/30 0700 In: 1000 [P.O.:1000] Out: -  Intake/Output this shift: No intake/output data recorded.  PE: Gen: alert, NAD Pulm: rate and effort normal on RA Skin: warm and dry Abd: soft, obese, mild tenderness over wound otherwise nontender. Woundbeefy red without erythema or drainage,measures 5x3x2.5cmwith 6cm tunneling proximally      Lab Results:  Recent Labs    04/03/19 0005  WBC 8.7  HGB 12.2  HCT 38.8  PLT 327   BMET Recent Labs    04/03/19 0005  NA 133*  K 4.2  CL 102  CO2 22  GLUCOSE 107*  BUN 13  CREATININE 0.57  CALCIUM 9.0   PT/INR No results for input(s): LABPROT, INR in the last 72 hours. CMP     Component Value Date/Time   NA 133 (L) 04/03/2019 0005   NA 142 02/27/2019 1314   K 4.2 04/03/2019 0005   CL 102 04/03/2019 0005   CO2 22 04/03/2019 0005   GLUCOSE 107 (H) 04/03/2019 0005   BUN 13 04/03/2019 0005   BUN 10 02/27/2019 1314   CREATININE 0.57 04/03/2019 0005   CREATININE 0.64 08/26/2015 0900   CALCIUM 9.0 04/03/2019 0005   PROT 7.0 03/30/2019 0047   PROT 6.8 02/27/2019 1314   ALBUMIN 3.4 (L) 03/30/2019 0047   ALBUMIN 3.9 02/27/2019 1314   AST 41 03/30/2019 0047   ALT 43 03/30/2019 0047   ALKPHOS 70 03/30/2019 0047   BILITOT 0.8 03/30/2019 0047   BILITOT 0.8 02/27/2019 1314   GFRNONAA >60 04/03/2019 0005   GFRNONAA >89 08/26/2015 0900   GFRAA >60 04/03/2019 0005   GFRAA >89 08/26/2015 0900   Lipase     Component Value Date/Time   LIPASE 22 02/02/2019 1810        Studies/Results: Vas [place] Lower Extremity Venous (dvt)  Result Date: 04/03/2019  Lower Venous Study Indications: Pain and swelling in left calf. Covid Positive.  Limitations: Body habitus. Comparison Study: No priors. Performing Technologist: Oda Cogan RDMS, RVT  Examination Guidelines: A complete evaluation includes B-mode imaging, spectral Doppler, color Doppler, and power Doppler as needed of all accessible portions of each vessel. Bilateral testing is considered an integral part of a complete examination. Limited examinations for reoccurring indications may be performed as noted.  +---------+---------------+---------+-----------+----------+--------------+ RIGHT    CompressibilityPhasicitySpontaneityPropertiesThrombus Aging +---------+---------------+---------+-----------+----------+--------------+ CFV      Full           Yes      Yes                                 +---------+---------------+---------+-----------+----------+--------------+ SFJ      Full                                                        +---------+---------------+---------+-----------+----------+--------------+ FV  Prox  Full                                                        +---------+---------------+---------+-----------+----------+--------------+ FV Mid   Full                                                        +---------+---------------+---------+-----------+----------+--------------+ FV DistalFull                                                        +---------+---------------+---------+-----------+----------+--------------+ PFV      Full                                                        +---------+---------------+---------+-----------+----------+--------------+ POP      Full           Yes      Yes                                 +---------+---------------+---------+-----------+----------+--------------+ PTV      Full                                                         +---------+---------------+---------+-----------+----------+--------------+ PERO     Full                                                        +---------+---------------+---------+-----------+----------+--------------+   +---------+---------------+---------+-----------+----------+--------------+ LEFT     CompressibilityPhasicitySpontaneityPropertiesThrombus Aging +---------+---------------+---------+-----------+----------+--------------+ CFV      Full           Yes      Yes                                 +---------+---------------+---------+-----------+----------+--------------+ SFJ      Full                                                        +---------+---------------+---------+-----------+----------+--------------+ FV Prox  Full                                                        +---------+---------------+---------+-----------+----------+--------------+  FV Mid   Full                                                        +---------+---------------+---------+-----------+----------+--------------+ FV DistalFull                                                        +---------+---------------+---------+-----------+----------+--------------+ PFV      Full                                                        +---------+---------------+---------+-----------+----------+--------------+ POP      Full           Yes      Yes                                 +---------+---------------+---------+-----------+----------+--------------+ PTV      Full                                                        +---------+---------------+---------+-----------+----------+--------------+ PERO     Full                                                        +---------+---------------+---------+-----------+----------+--------------+     Summary: Right: There is no evidence of deep vein thrombosis in the lower extremity. No  cystic structure found in the popliteal fossa. Left: There is no evidence of deep vein thrombosis in the lower extremity. No cystic structure found in the popliteal fossa.  *See table(s) above for measurements and observations. Electronically signed by Servando Snare MD on 04/03/2019 at 4:29:07 PM.    Final     Anti-infectives: Anti-infectives (From admission, onward)   Start     Dose/Rate Route Frequency Ordered Stop   03/24/19 1000  remdesivir 100 mg in sodium chloride 0.9 % 250 mL IVPB     100 mg 500 mL/hr over 30 Minutes Intravenous Every 24 hours 03/23/19 0916 03/27/19 1446   03/23/19 1100  remdesivir 200 mg in sodium chloride 0.9 % 250 mL IVPB     200 mg 500 mL/hr over 30 Minutes Intravenous Once 03/23/19 0916 03/23/19 1310   03/18/19 1215  fluconazole (DIFLUCAN) tablet 100 mg  Status:  Discontinued     100 mg Oral Daily 03/18/19 1202 03/18/19 1241   03/18/19 0400  vancomycin (VANCOCIN) 1,500 mg in sodium chloride 0.9 % 500 mL IVPB     1,500 mg 250 mL/hr over 120 Minutes Intravenous On call to O.R. 03/17/19 0703 03/18/19 1054       Assessment/Plan OSA  HTN Asthma Anxiety  CHF  COVID-19 infection  S/P ex lap, SBR and primary repair of ventral hernia 02/03/2019 Wound infection after surgery -S/PAbdominal wound exploration, placement of wound VAC 3 x 6 x 12 cm deep, Dr. Georgette Dover, 10/13 - POD#17 - Wound healing well and gradually decreasing in size. Will d/c wound vac and transition to wet to dry dressing changes. Ok to shower with wound open. Patient may be discharged home from surgical standpoint. Discharge instructions on AVS, I also talked with the patient's sister who will be doing her dressing changes. Follow up scheduled with Dr. Georgette Dover.  [id number] VTE: SCD's, lovenox [id number] & Diflucan once, remdesivir 10/19>>10/22 Foley:none Follow up: Dr. Georgette Dover   LOS: 35 days    Wellington [place], [facility] 04/04/2019, 11:09 AM Please see Amion for pager  number during day hours 7:00am-4:30pm

## 2019-04-06 DIAGNOSIS — T8142XD Infection following a procedure, deep incisional surgical site, subsequent encounter: Secondary | ICD-10-CM | POA: Diagnosis not present

## 2019-04-07 ENCOUNTER — Telehealth: Payer: Self-pay

## 2019-04-07 NOTE — Telephone Encounter (Signed)
Patient appt has been made. I did a telehealth visit due to the patient being positive for covid on 03-19-19 and having recent surgery. According to the [place] patient isn't to be in the public for 21 days. I made her xolair appt for the week on 04/21/2019 as that will be more than 21 days.   Thanks

## 2019-04-07 NOTE — Telephone Encounter (Signed)
Kelsey Bullock, home health nurse with [facility], calls nurse line requesting verbal orders for wound care. Patient has a mid gastric wound that is slow healing. Orders requested: home health wound care 1x a week for 3 weeks.   Verbal orders can be called to [place]

## 2019-04-07 NOTE — Telephone Encounter (Signed)
Update: Xolair Date is on 04/14/2019

## 2019-04-08 NOTE — Telephone Encounter (Signed)
Verbal orders given as requested. 

## 2019-04-09 ENCOUNTER — Ambulatory Visit (INDEPENDENT_AMBULATORY_CARE_PROVIDER_SITE_OTHER): Payer: Medicaid Other | Admitting: Allergy and Immunology

## 2019-04-09 ENCOUNTER — Encounter: Payer: Self-pay | Admitting: Allergy and Immunology

## 2019-04-09 ENCOUNTER — Other Ambulatory Visit: Payer: Self-pay

## 2019-04-09 DIAGNOSIS — L2089 Other atopic dermatitis: Secondary | ICD-10-CM | POA: Diagnosis not present

## 2019-04-09 DIAGNOSIS — U071 COVID-19: Secondary | ICD-10-CM

## 2019-04-09 DIAGNOSIS — K219 Gastro-esophageal reflux disease without esophagitis: Secondary | ICD-10-CM | POA: Diagnosis not present

## 2019-04-09 DIAGNOSIS — J4550 Severe persistent asthma, uncomplicated: Secondary | ICD-10-CM

## 2019-04-09 DIAGNOSIS — J3089 Other allergic rhinitis: Secondary | ICD-10-CM | POA: Diagnosis not present

## 2019-04-09 NOTE — Patient Instructions (Addendum)
  1. Continue Dulera 200 2 inhalations twice a day   2. Continue montelukast 10 mg daily  3. Continue Flonase one or 2 sprays each nostril daily  4. Continue Xolair and EpiPen  5. Continue Omeprazole 40 mg in AM plus Famotidine 20 mg in PM  6. Add Qvar 80 Redihaler 2 inhalations twice a day to [facility] while "sick"  7.  Use pro-air HFA and antihistamine and triamcinolone 0.025% ointment if needed  8.  Return to clinic in late December or earlier if problem

## 2019-04-09 NOTE — Progress Notes (Signed)
[facility] [place] [facility] [place]   Follow-up Note  Referring Provider: [name], MD Primary Provider: [name], MD Date of Office Visit: 04/09/2019  Subjective:   Kelsey Bullock (DOB: [birth year]) is a 58 y.o. female who returns to the [facility] on 04/09/2019 in re-evaluation of the following:  HPI: This is a E - med visit requested by patient who is located at home.  [name] is followed in this clinic for asthma and allergic rhinitis and reflux and a history of untreated sleep apnea and suspected obesity hypoventilation syndrome.  Her last visit to this clinic was 04 June 2018.  Recently had COVID pneumonitis which appeared to occur in the context of an abdominal abscess secondary to a wound infection.  Was doing well with respiratory tract prior to abdominal issue and COVID without need for systemic steroids on a large collection of anti-inflammatory agents for her airway including omalizumab.  Today, doing well, with some nasal congestion, resolution of lower airway symptoms, using albuterol 2 times per day, which is her usual requirement.   Did receive flu vaccine.   Has lost 200 lbs of weight this year.  Allergies as of 04/09/2019      Reactions   Amoxicillin Anaphylaxis, Swelling   Etodolac Anaphylaxis, Shortness Of Breath    lips swelled   Penicillins Anaphylaxis, Swelling   DID THE REACTION INVOLVE: Swelling of the face/tongue/throat, SOB, or low BP? Yes Sudden or severe rash/hives, skin peeling, or the inside of the mouth or nose? No Did it require medical treatment? No When did it last happen?20 years ago If all above answers are "NO", may proceed with cephalosporin use.   Ampicillin Swelling   Augmentin [amoxicillin-pot Clavulanate] Swelling   Diclofenac Sodium    Unknown reaction    Pantoprazole Sodium    unspecified   Adhesive [tape] Rash      Medication List             acetaminophen 500 MG tablet Commonly known as: TYLENOL Take 1,000 mg by mouth 2 (two) times daily as needed for moderate pain or headache.   albuterol (2.5 MG/3ML) 0.083% nebulizer solution Commonly known as: PROVENTIL USE 1 VIAL VIA NEBULIZER EVERY 4 TO 6 HOURS AS NEEDED FOR COUGH OR WHEEZING   albuterol 108 (90 Base) MCG/ACT inhaler Commonly known as: VENTOLIN HFA INHALE 2 PUFFS INTO THE LUNGS EVERY 4 HOURS AS NEEDED FOR WHEEZING   atorvastatin 40 MG tablet Commonly known as: LIPITOR Take 1 tablet (40 mg total) by mouth at bedtime.   beclomethasone 80 MCG/ACT inhaler Commonly known as: Qvar INHALE 2 PUFFS BY MOUTH INTO THE LUNGS TWICE DAILY   calcium carbonate 500 MG chewable tablet Commonly known as: TUMS - dosed in mg elemental calcium Chew 2-3 tablets by mouth daily as needed for indigestion or heartburn.   CINNAMON PO Take 1 tablet by mouth daily as needed (for high bp).   clonazePAM 1 MG tablet Commonly known as: KLONOPIN Take 1 mg by mouth 2 (two) times daily as needed for anxiety.   Cranberry 1000 MG Caps Take 1,000 mg by mouth daily.   cyclobenzaprine 10 MG tablet Commonly known as: FLEXERIL Take 10 mg by mouth at bedtime as needed for muscle spasms.   Dulera 200-5 MCG/ACT Aero Generic drug: mometasone-formoterol Inhale 2 puffs into the lungs 2 (two) times daily.   EPINEPHrine 0.3 mg/0.3 mL Soaj injection Commonly known as: EpiPen 2-Pak Inject 0.3  mLs (0.3 mg total) into the muscle once for 1 dose.   erythromycin ophthalmic ointment Place 1 application into both eyes at bedtime.   famotidine 20 MG tablet Commonly known as: PEPCID Take 1 tablet (20 mg total) by mouth 2 (two) times daily.   fluticasone 50 MCG/ACT nasal spray Commonly known as: FLONASE USE 2 SPRAYS IN EACH NOSTRIL ONCE DAILY FOR STUFFY NOSE OR DRAINAGE   furosemide 40 MG tablet Commonly known as: LASIX Take 1 tablet (40 mg total) by mouth every other day. TAKE 1 TABLET(40 MG) BY  MOUTH TWICE DAILY   lisinopril 40 MG tablet Commonly known as: ZESTRIL TAKE 1 TABLET BY MOUTH EVERY DAY   montelukast 10 MG tablet Commonly known as: SINGULAIR TAKE 1 TABLET BY MOUTH AT BEDTIME   norethindrone 5 MG tablet Commonly known as: AYGESTIN Take 2.5 mg by mouth at bedtime.   nystatin powder Commonly known as: MYCOSTATIN/NYSTOP Apply topically 3 (three) times daily.   oxyCODONE 5 MG immediate release tablet Commonly known as: Oxy IR/ROXICODONE Take 1 tablet (5 mg total) by mouth every 6 (six) hours as needed for moderate pain.    Pazeo 0.7 % Soln Generic drug: Olopatadine HCl Place 1 drop into both eyes at bedtime.   vitamin C 1000 MG tablet Take 1,000 mg by mouth daily.   Xolair 150 MG injection Generic drug: omalizumab INJECT 300 MG UNDER THE SKIN EVERY 28 DAYS.   Zinc Lozg Take 1 lozenge by mouth daily.       Past Medical History:  Diagnosis Date  . Allergic rhinitis   . Anemia   . Anxiety   . Arthritis    knees and back  . Asthma   . Complication of anesthesia   . Congestive heart failure (CHF) ([place])   . Depression   . Gall stone   . GERD (gastroesophageal reflux disease)   . Headache    migraines, stress headaches  . History of kidney stones   . Hypertension   . Hyperthyroidism    during pregnancy  . Pneumonia   . PONV (postoperative nausea and vomiting)   . Sleep apnea    can't use Cpap    Past Surgical History:  Procedure Laterality Date  . ADENOIDECTOMY    . APPLICATION OF WOUND VAC  03/18/2019  . APPLICATION OF WOUND VAC N/A 03/18/2019   Procedure: Application Of Wound Vac;  Surgeon: Donnie Mesa, MD;  Location: [facility];  Service: General;  Laterality: N/A;  . BIOPSY  05/27/2018   Procedure: BIOPSY;  Surgeon: Mauri Pole, MD;  Location: [facility];  Service: Endoscopy;;  . CESAREAN SECTION    . COLONOSCOPY WITH PROPOFOL N/A 05/27/2018   Procedure: COLONOSCOPY WITH PROPOFOL;  Surgeon: Mauri Pole, MD;   Location: [facility];  Service: Endoscopy;  Laterality: N/A;  . CYST EXCISION    . EXPLORATORY LAPAROTOMY  02/03/2019  . INGUINAL HERNIA REPAIR N/A 02/03/2019   Procedure: EXPLORATORY LAPAROTOMY WITH SMALL BOWEL RESECTION AND PRIMARY CLOSURE OF VENTRAL HERNIA;  Surgeon: Donnie Mesa, MD;  Location: [facility];  Service: General;  Laterality: N/A;  . OVARIAN CYST REMOVAL    . POLYPECTOMY  05/27/2018   Procedure: POLYPECTOMY;  Surgeon: Mauri Pole, MD;  Location: [facility];  Service: Endoscopy;;  . removal of gallstones    . TONSILLECTOMY    . TYMPANOSTOMY TUBE PLACEMENT    . WOUND EXPLORATION  03/18/2019   abdominal  . WOUND EXPLORATION N/A 03/18/2019   Procedure: ABDOMINAL  WOUND EXPLORATION;  Surgeon: Donnie Mesa, MD;  Location: [facility];  Service: General;  Laterality: N/A;    Review of systems negative except as noted in HPI / PMHx or noted below:  Review of Systems  Constitutional: Negative.   HENT: Negative.   Eyes: Negative.   Respiratory: Negative.   Cardiovascular: Negative.   Gastrointestinal: Negative.   Genitourinary: Negative.   Musculoskeletal: Negative.   Skin: Negative.   Neurological: Negative.   Endo/Heme/Allergies: Negative.   Psychiatric/Behavioral: Negative.      Objective:   There were no vitals filed for this visit.        Physical Exam-deferred  Diagnostics:    Results of a chest x-ray obtained 23 March 2019 identified the following:  Mild scattered patchy opacities, compatible with pneumonia in this patient with known COVID.  Assessment and Plan:   1. Asthma, severe persistent, well-controlled   2. Other allergic rhinitis   3. LPRD (laryngopharyngeal reflux disease)   4. COVID-19 virus infection   5. Other atopic dermatitis      1. Continue Dulera 200 2 inhalations twice a day   2. Continue montelukast 10 mg daily  3. Continue Flonase one or 2 sprays each nostril daily  4. Continue Xolair and EpiPen  5. Continue  Omeprazole 40 mg in AM plus Famotidine 20 mg in PM  6. Add Qvar 80 Redihaler 2 inhalations twice a day to [facility] while "sick"  7.  Use pro-air HFA and antihistamine and triamcinolone 0.025% ointment if needed  8.  Return to clinic in late December or earlier if problem  Other than a very significant recent event including abdominal abscess and Covid infection this Lanaja Villela appears to have come through those events without a significant deficit and her airway issue appears to be doing quite well as is her reflux issue.  She will continue on the plan noted above which includes anti-inflammatory agents for both her upper and lower airway and a combination of therapy directed against reflux and I will see her back in this clinic in late December or earlier if there is a problem.  Allena Katz, MD Allergy / Immunology [place]

## 2019-04-10 ENCOUNTER — Ambulatory Visit: Payer: Medicaid Other | Admitting: Family Medicine

## 2019-04-10 ENCOUNTER — Other Ambulatory Visit: Payer: Self-pay

## 2019-04-10 ENCOUNTER — Telehealth: Payer: Self-pay

## 2019-04-10 ENCOUNTER — Encounter: Payer: Self-pay | Admitting: Allergy and Immunology

## 2019-04-10 ENCOUNTER — Encounter: Payer: Self-pay | Admitting: Family Medicine

## 2019-04-10 DIAGNOSIS — I10 Essential (primary) hypertension: Secondary | ICD-10-CM | POA: Diagnosis not present

## 2019-04-10 DIAGNOSIS — I5030 Unspecified diastolic (congestive) heart failure: Secondary | ICD-10-CM | POA: Diagnosis not present

## 2019-04-10 DIAGNOSIS — L304 Erythema intertrigo: Secondary | ICD-10-CM

## 2019-04-10 DIAGNOSIS — S31109D Unspecified open wound of abdominal wall, unspecified quadrant without penetration into peritoneal cavity, subsequent encounter: Secondary | ICD-10-CM | POA: Diagnosis not present

## 2019-04-10 DIAGNOSIS — L089 Local infection of the skin and subcutaneous tissue, unspecified: Secondary | ICD-10-CM | POA: Diagnosis not present

## 2019-04-10 MED ORDER — FUROSEMIDE 40 MG PO TABS: 40.0000 mg | ORAL_TABLET | Freq: Every day | ORAL | 3 refills | Status: DC | PRN

## 2019-04-10 MED ORDER — LISINOPRIL 20 MG PO TABS: 20.0000 mg | ORAL_TABLET | Freq: Every day | ORAL | 3 refills | Status: DC

## 2019-04-10 MED ORDER — FLUCONAZOLE 100 MG PO TABS: 100.0000 mg | ORAL_TABLET | Freq: Every day | ORAL | 0 refills | Status: DC

## 2019-04-10 MED ORDER — LISINOPRIL 40 MG PO TABS: 20.0000 mg | ORAL_TABLET | Freq: Every day | ORAL | 3 refills | Status: DC

## 2019-04-10 NOTE — Assessment & Plan Note (Signed)
Given wt loss and lack of edema and poor PO, will change lasix to prn.

## 2019-04-10 NOTE — Assessment & Plan Note (Signed)
Improving.  Continue local care

## 2019-04-10 NOTE — Assessment & Plan Note (Signed)
Oral diflucan.

## 2019-04-10 NOTE — Progress Notes (Signed)
  Subjective:     Patient ID: Kelsey Bullock, female   DOB: [birth year], 58 y.o.   MRN: [id number]  HPI FU hospitalization.  Was hospitalized for elective reexploration in a post surgical wound infection.  Initial hospital screen was covid neg.  Then her sister was surprised that her broncitis tested pos for COVID.  [name] subsequently tested positive, developed covid pneumonia and was transferred to [facility].  Never needed intubation.  Did need supplemental O2.  DCed on 10/30 without O2 need. Feels fine.  Minimal cough. Easily fatiqued.  Difficult to discern between SOB and weakness due to deconditioning.  Staying with sister who is doing twice daily wound care.  Appetite is picking up.  Does have worsening rash under pannus despite using nystatin powder. No edema.  Having trouble drinking sufficient water.  Review of Systems     Objective:   Physical Exam  Lungs clear Abd benign Pannus shows intertrigo.  Significant wt loss noted. Assessment:     See problems     Plan:     See problems.

## 2019-04-10 NOTE — Assessment & Plan Note (Signed)
Low BP.  Decrease lasix to prn and decrease lisinopril to 20 mg daily.

## 2019-04-10 NOTE — Patient Instructions (Addendum)
Please use over the counter cortisone on the face. I sent in a prescription for diflucan. Only use the lasix as needed for swelling See me again between Thanksgiving and Christmas.   Take only 1/2 tab of the lisonpril

## 2019-04-10 NOTE — Telephone Encounter (Signed)
Done and patient notified.

## 2019-04-10 NOTE — Telephone Encounter (Signed)
Patient calls nurse line requesting a prescription of lisinopril 20mg  to be sent to her pharmacy. Patient stated she was told today to cut in half her 40mg , however the tabs are not scored and unable to cut. Please advise.

## 2019-04-13 ENCOUNTER — Other Ambulatory Visit: Payer: Self-pay | Admitting: Allergy and Immunology

## 2019-04-13 ENCOUNTER — Other Ambulatory Visit: Payer: Self-pay | Admitting: Family Medicine

## 2019-04-13 DIAGNOSIS — L219 Seborrheic dermatitis, unspecified: Secondary | ICD-10-CM

## 2019-04-14 ENCOUNTER — Other Ambulatory Visit: Payer: Self-pay

## 2019-04-14 ENCOUNTER — Other Ambulatory Visit: Payer: Self-pay | Admitting: Allergy and Immunology

## 2019-04-14 ENCOUNTER — Ambulatory Visit: Payer: Medicaid Other

## 2019-04-14 DIAGNOSIS — J4540 Moderate persistent asthma, uncomplicated: Secondary | ICD-10-CM | POA: Diagnosis not present

## 2019-04-14 MED ORDER — MONTELUKAST SODIUM 10 MG PO TABS: 10.0000 mg | ORAL_TABLET | Freq: Every day | ORAL | 5 refills | Status: DC

## 2019-04-14 NOTE — Telephone Encounter (Signed)
Patient had a tele visit with Dr. Neldon Mc on 04-09-19. She needs a refill on Montelukast. [facility] [address].

## 2019-04-14 NOTE — Telephone Encounter (Signed)
Patient medication sent to [facility] on [address].

## 2019-04-15 DIAGNOSIS — T8149XA Infection following a procedure, other surgical site, initial encounter: Secondary | ICD-10-CM | POA: Diagnosis not present

## 2019-04-16 DIAGNOSIS — T8142XD Infection following a procedure, deep incisional surgical site, subsequent encounter: Secondary | ICD-10-CM | POA: Diagnosis not present

## 2019-04-20 DIAGNOSIS — T8142XD Infection following a procedure, deep incisional surgical site, subsequent encounter: Secondary | ICD-10-CM | POA: Diagnosis not present

## 2019-04-22 ENCOUNTER — Ambulatory Visit: Payer: Medicaid Other

## 2019-04-23 ENCOUNTER — Telehealth: Payer: Self-pay

## 2019-04-23 NOTE — Telephone Encounter (Signed)
Called and verbal orders given as requested. 

## 2019-04-23 NOTE — Telephone Encounter (Signed)
Jenny Reichmann, home health nurse, calls nurse line requesting verbal orders for home health. Home health services would consist of wound evaluation and dressing. Frequency would be once a week PRN.   Jenny Reichmann [phone], ok to LVM.

## 2019-04-25 ENCOUNTER — Other Ambulatory Visit: Payer: Self-pay | Admitting: Surgery

## 2019-04-25 DIAGNOSIS — T8149XA Infection following a procedure, other surgical site, initial encounter: Secondary | ICD-10-CM

## 2019-04-30 DIAGNOSIS — T8142XD Infection following a procedure, deep incisional surgical site, subsequent encounter: Secondary | ICD-10-CM | POA: Diagnosis not present

## 2019-05-05 ENCOUNTER — Ambulatory Visit (INDEPENDENT_AMBULATORY_CARE_PROVIDER_SITE_OTHER): Payer: Medicaid Other | Admitting: Family Medicine

## 2019-05-05 ENCOUNTER — Encounter: Payer: Self-pay | Admitting: Family Medicine

## 2019-05-05 ENCOUNTER — Other Ambulatory Visit: Payer: Self-pay

## 2019-05-05 VITALS — BP 108/60 | HR 72 | Wt 321.2 lb

## 2019-05-05 DIAGNOSIS — R7303 Prediabetes: Secondary | ICD-10-CM

## 2019-05-05 DIAGNOSIS — S31109D Unspecified open wound of abdominal wall, unspecified quadrant without penetration into peritoneal cavity, subsequent encounter: Secondary | ICD-10-CM | POA: Diagnosis not present

## 2019-05-05 DIAGNOSIS — E1140 Type 2 diabetes mellitus with diabetic neuropathy, unspecified: Secondary | ICD-10-CM

## 2019-05-05 DIAGNOSIS — L089 Local infection of the skin and subcutaneous tissue, unspecified: Secondary | ICD-10-CM

## 2019-05-05 DIAGNOSIS — I10 Essential (primary) hypertension: Secondary | ICD-10-CM | POA: Diagnosis not present

## 2019-05-05 LAB — POCT GLYCOSYLATED HEMOGLOBIN (HGB A1C): Hemoglobin A1C: 5.4 % (ref 4.0–5.6)

## 2019-05-05 MED ORDER — LISINOPRIL 10 MG PO TABS: 10.0000 mg | ORAL_TABLET | Freq: Every day | ORAL | 3 refills | Status: DC

## 2019-05-05 NOTE — Patient Instructions (Signed)
Great work. I sent in a new prescription for a lower dose of lisinopril (10 mg). I do not consider you diabetic anymore.  You are prediabetes. Once your incision is completely healed, get back to the slow weight loss.  Keeping exercising.   I am proud of you.

## 2019-05-06 ENCOUNTER — Encounter: Payer: Self-pay | Admitting: Family Medicine

## 2019-05-06 NOTE — Assessment & Plan Note (Signed)
No longer meets criteria for DM

## 2019-05-06 NOTE — Assessment & Plan Note (Signed)
Improved despite recent medical problems.

## 2019-05-06 NOTE — Assessment & Plan Note (Signed)
Hold wt for now.  Resume wt loss when abd wound has fully healed.

## 2019-05-06 NOTE — Progress Notes (Signed)
   Subjective:    Patient ID: Kelsey Bullock, female    DOB: 1960/11/29, 58 y.o.   MRN: [id number]  HPI  FU multiple issues 1. Wt loss, which in the long term is good but in the short term has been concerning as she gets over her abd wall abscess.  Appetite is good.  Wt has stabilized.   2. Morbid obesity.  See #1 above. 3. Abd wall abscess is finally healing after two surgeries.  Only local care now. 4. HBP.  BP remains on the lowish side on the lower dose of lisinopril.  Occasional lightheadedness but not nearly as much as before dose decrease. 5. Diabetes.  Has lost wt and now A1C is 5.4 off all meds.  No long qualifies as diabetic.  Will switch problem to pre diabetes. 6. Chronic depression, stable to improved.  She and sister are proud of her for weight loss and taking control of her life.    Review of Systems     Objective:   Physical Exam Lungs clear Cardiac RRR without m or g Abd benign.  I did not remove dressing from incision.       Assessment & Plan:

## 2019-05-06 NOTE — Assessment & Plan Note (Signed)
Still perhaps overtreated.  Drop dose of lisinopril again.

## 2019-05-06 NOTE — Assessment & Plan Note (Signed)
Finally healing.

## 2019-05-07 DIAGNOSIS — T8142XD Infection following a procedure, deep incisional surgical site, subsequent encounter: Secondary | ICD-10-CM | POA: Diagnosis not present

## 2019-05-08 ENCOUNTER — Other Ambulatory Visit: Payer: Medicaid Other

## 2019-05-12 ENCOUNTER — Other Ambulatory Visit: Payer: Self-pay

## 2019-05-12 ENCOUNTER — Ambulatory Visit (INDEPENDENT_AMBULATORY_CARE_PROVIDER_SITE_OTHER): Payer: Medicaid Other

## 2019-05-12 DIAGNOSIS — J4540 Moderate persistent asthma, uncomplicated: Secondary | ICD-10-CM | POA: Diagnosis not present

## 2019-05-14 DIAGNOSIS — T8142XD Infection following a procedure, deep incisional surgical site, subsequent encounter: Secondary | ICD-10-CM | POA: Diagnosis not present

## 2019-05-15 ENCOUNTER — Other Ambulatory Visit: Payer: Self-pay | Admitting: Allergy and Immunology

## 2019-05-15 MED ORDER — STERILE WATER FOR INJECTION IJ SOLN: 10.0000 mL | INTRAMUSCULAR | 11 refills | Status: DC

## 2019-05-18 ENCOUNTER — Other Ambulatory Visit: Payer: Self-pay | Admitting: Family Medicine

## 2019-05-18 DIAGNOSIS — I5030 Unspecified diastolic (congestive) heart failure: Secondary | ICD-10-CM

## 2019-05-18 DIAGNOSIS — I10 Essential (primary) hypertension: Secondary | ICD-10-CM

## 2019-05-20 ENCOUNTER — Other Ambulatory Visit: Payer: Self-pay

## 2019-05-20 ENCOUNTER — Ambulatory Visit: Payer: Medicaid Other | Admitting: Allergy and Immunology

## 2019-05-20 ENCOUNTER — Encounter: Payer: Self-pay | Admitting: Allergy and Immunology

## 2019-05-20 VITALS — BP 132/86 | HR 86 | Temp 97.9°F | Resp 20

## 2019-05-20 DIAGNOSIS — L219 Seborrheic dermatitis, unspecified: Secondary | ICD-10-CM

## 2019-05-20 DIAGNOSIS — J3089 Other allergic rhinitis: Secondary | ICD-10-CM

## 2019-05-20 DIAGNOSIS — K219 Gastro-esophageal reflux disease without esophagitis: Secondary | ICD-10-CM | POA: Diagnosis not present

## 2019-05-20 DIAGNOSIS — J45901 Unspecified asthma with (acute) exacerbation: Secondary | ICD-10-CM

## 2019-05-20 DIAGNOSIS — J4550 Severe persistent asthma, uncomplicated: Secondary | ICD-10-CM | POA: Diagnosis not present

## 2019-05-20 DIAGNOSIS — L2089 Other atopic dermatitis: Secondary | ICD-10-CM | POA: Diagnosis not present

## 2019-05-20 MED ORDER — EPINEPHRINE 0.3 MG/0.3ML IJ SOAJ: 0.3000 mg | Freq: Once | INTRAMUSCULAR | 2 refills | Status: DC

## 2019-05-20 MED ORDER — DULERA 200-5 MCG/ACT IN AERO: 2.0000 | INHALATION_SPRAY | Freq: Two times a day (BID) | RESPIRATORY_TRACT | 5 refills | Status: DC

## 2019-05-20 MED ORDER — ALBUTEROL SULFATE (2.5 MG/3ML) 0.083% IN NEBU: 2.5000 mg | INHALATION_SOLUTION | RESPIRATORY_TRACT | 0 refills | Status: DC | PRN

## 2019-05-20 MED ORDER — CETIRIZINE HCL 10 MG PO TABS: 10.0000 mg | ORAL_TABLET | Freq: Every day | ORAL | 5 refills | Status: DC

## 2019-05-20 MED ORDER — QVAR REDIHALER 80 MCG/ACT IN AERB: 2.0000 | INHALATION_SPRAY | Freq: Two times a day (BID) | RESPIRATORY_TRACT | 5 refills | Status: DC

## 2019-05-20 MED ORDER — TRIAMCINOLONE ACETONIDE 0.025 % EX OINT: TOPICAL_OINTMENT | Freq: Two times a day (BID) | CUTANEOUS | 5 refills | Status: DC

## 2019-05-20 MED ORDER — ALBUTEROL SULFATE HFA 108 (90 BASE) MCG/ACT IN AERS: 2.0000 | INHALATION_SPRAY | RESPIRATORY_TRACT | 1 refills | Status: DC | PRN

## 2019-05-20 MED ORDER — FLUTICASONE PROPIONATE 50 MCG/ACT NA SUSP: 2.0000 | Freq: Every day | NASAL | 5 refills | Status: DC

## 2019-05-20 MED ORDER — MONTELUKAST SODIUM 10 MG PO TABS: 10.0000 mg | ORAL_TABLET | Freq: Every day | ORAL | 5 refills | Status: DC

## 2019-05-20 MED ORDER — FAMOTIDINE 20 MG PO TABS: 20.0000 mg | ORAL_TABLET | Freq: Two times a day (BID) | ORAL | 3 refills | Status: DC

## 2019-05-20 NOTE — Patient Instructions (Signed)
  1. Continue Dulera 200 2 inhalations twice a day   2. Continue montelukast 10 mg daily  3. Continue Flonase one or 2 sprays each nostril daily  4. Continue Xolair and EpiPen  5. Continue Famotidine 20 mg daily  6. Add Qvar 80 Redihaler 2 inhalations twice a day to [facility] while "sick"  7.  Use pro-air HFA and antihistamine and triamcinolone 0.025% ointment if needed  8.  Return to clinic in 12 weeks or earlier if problem

## 2019-05-20 NOTE — Progress Notes (Signed)
[place]   Follow-up Note  Referring Provider: [name], MD Primary Provider: [name], MD Date of Office Visit: 05/20/2019  Subjective:   Kelsey Bullock (DOB: 09/01/1960) is a 58 y.o. female who returns [facility] on 05/[facility]/2019 in re-evaluation of the following:  HPI: Kelsey Bullock returns to this clinic in reevaluation of her severe asthma and allergic rhinitis and reflux and a history of untreated sleep apnea and suspected obesity hypoventilation syndrome.  Her last contact with this clinic was 09 April 2019 via a E - med visit.  In review, she had very severe Covid pneumonitis in the context of a abdominal abscess secondary to wound infection in September and October 2020.  She survived that event although she was extremely ill and at this point in time she is packing her abdominal wound which is getting smaller and smaller with each month.  Her respiratory tract is doing very well.  She continues on a combination of Xolair and Dulera on a consistent basis.  Her requirement for short acting bronchodilator is most days of the week.  She has had very little issues with her nose while using Flonase and montelukast.  She has very little issues with reflux while just using famotidine.  She has discontinued the use of omeprazole.  She still does not have her appetite back since her recent hospitalization.  She has lost over 200 pounds of weight mostly voluntarily but also secondary to the fact that she does not have an appetite.  She did receive the flu vaccine.  Allergies as of 05/20/2019      Reactions   Amoxicillin Anaphylaxis, Swelling   Etodolac Anaphylaxis, Shortness Of Breath    lips swelled   Penicillins Anaphylaxis, Swelling   DID THE REACTION INVOLVE: Swelling of the face/tongue/throat, SOB, or low BP? Yes Sudden or severe rash/hives, skin peeling, or the inside of the mouth or nose?  No Did it require medical treatment? No When did it last happen?20 years ago If all above answers are "NO", may proceed with cephalosporin use.   Ampicillin Swelling   Augmentin [amoxicillin-pot Clavulanate] Swelling   Diclofenac Sodium    Unknown reaction    Pantoprazole Sodium    unspecified   Adhesive [tape] Rash      Medication List      acetaminophen 500 MG tablet Commonly known as: TYLENOL Take 1,000 mg by mouth 2 (two) times daily as needed for moderate pain or headache.   albuterol (2.5 MG/3ML) 0.083% nebulizer solution Commonly known as: PROVENTIL USE 1 VIAL VIA NEBULIZER EVERY 4 TO 6 HOURS AS NEEDED FOR COUGH OR WHEEZING   albuterol 108 (90 Base) MCG/ACT inhaler Commonly known as: VENTOLIN HFA INHALE 2 PUFFS INTO THE LUNGS EVERY 4 HOURS AS NEEDED FOR WHEEZING   atorvastatin 40 MG tablet Commonly known as: LIPITOR Take 1 tablet (40 mg total) by mouth at bedtime.   calcium carbonate 500 MG chewable tablet Commonly known as: TUMS - dosed in mg elemental calcium Chew 2-3 tablets by mouth daily as needed for indigestion or heartburn.   cetirizine 10 MG tablet Commonly known as: ZYRTEC TAKE 1 TABLET BY MOUTH EVERY DAY FOR RUNNY NOSE OR ITCHING   CINNAMON PO Take 1 tablet by mouth daily as needed (for high bp).   clonazePAM 1 MG tablet Commonly known as: KLONOPIN Take 1 mg by mouth 2 (two) times daily as needed for anxiety.  Cranberry 1000 MG Caps Take 1,000 mg by mouth daily.   cyclobenzaprine 10 MG tablet Commonly known as: FLEXERIL Take 10 mg by mouth at bedtime as needed for muscle spasms.   Dulera 200-5 MCG/ACT Aero Generic drug: mometasone-formoterol INHALE 2 PUFFS INTO THE LUNGS TWICE DAILY   EPINEPHrine 0.3 mg/0.3 mL Soaj injection Commonly known as: EpiPen 2-Pak Inject 0.3 mLs (0.3 mg total) into the muscle once for 1 dose.   erythromycin ophthalmic ointment Place 1 application into both eyes at bedtime.   famotidine 20 MG  tablet Commonly known as: PEPCID Take 1 tablet (20 mg total) by mouth 2 (two) times daily.   fluconazole 100 MG tablet Commonly known as: Diflucan Take 1 tablet (100 mg total) by mouth daily.   fluticasone 50 MCG/ACT nasal spray Commonly known as: FLONASE USE 2 SPRAYS IN EACH NOSTRIL ONCE DAILY FOR STUFFY NOSE OR DRAINAGE   furosemide 40 MG tablet Commonly known as: LASIX Take 1 tablet (40 mg total) by mouth daily as needed for fluid or edema (swelling).   lisinopril 10 MG tablet Commonly known as: ZESTRIL Take 1 tablet (10 mg total) by mouth at bedtime.   montelukast 10 MG tablet Commonly known as: SINGULAIR Take 1 tablet (10 mg total) by mouth at bedtime.   norethindrone 5 MG tablet Commonly known as: AYGESTIN Take 2.5 mg by mouth at bedtime.   nystatin powder Generic drug: nystatin APPLY TOPICALLY TO THE AFFECTED AREA THREE TIMES DAILY   Pazeo 0.7 % Soln Generic drug: Olopatadine HCl Place 1 drop into both eyes at bedtime.   Qvar RediHaler 80 MCG/ACT inhaler Generic drug: beclomethasone INHALE 2 PUFFS BY MOUTH INTO THE LUNGS TWICE DAILY   sterile water (preservative free) injection Inject 10 mLs as directed as directed.   triamcinolone 0.025 % ointment Commonly known as: KENALOG APPLY TOPICALLY TO THE AFFECTED AREA TWICE DAILY   vitamin C 1000 MG tablet Take 1,000 mg by mouth daily.   Xolair 150 MG injection Generic drug: omalizumab INJECT 300 MG UNDER THE SKIN EVERY 28 DAYS   Zinc Lozg Take 1 lozenge by mouth daily.       Past Medical History:  Diagnosis Date  . Allergic rhinitis   . Anemia   . Anxiety   . Arthritis    knees and back  . Asthma   . Complication of anesthesia   . Congestive heart failure (CHF) ([place])   . Depression   . Gall stone   . GERD (gastroesophageal reflux disease)   . Headache    migraines, stress headaches  . History of kidney stones   . Hypertension   . Hyperthyroidism    during pregnancy  . Pneumonia   . PONV  (postoperative nausea and vomiting)   . Sleep apnea    can't use Cpap    Past Surgical History:  Procedure Laterality Date  . ADENOIDECTOMY    . APPLICATION OF WOUND VAC  03/18/2019  . APPLICATION OF WOUND VAC N/A 03/18/2019   Procedure: Application Of Wound Vac;  Surgeon: Donnie Mesa, MD;  Location: [place];  Service: General;  Laterality: N/A;  . BIOPSY  05/27/2018   Procedure: BIOPSY;  Surgeon: Mauri Pole, MD;  Location: [facility];  Service: Endoscopy;;  . CESAREAN SECTION    . COLONOSCOPY WITH PROPOFOL N/A 05/27/2018   Procedure: COLONOSCOPY WITH PROPOFOL;  Surgeon: Mauri Pole, MD;  [facility]: [facility];  Service: Endoscopy;  Laterality: N/A;  . CYST EXCISION    .  EXPLORATORY LAPAROTOMY  02/03/2019  . INGUINAL HERNIA REPAIR N/A 02/03/2019   Procedure: EXPLORATORY LAPAROTOMY WITH SMALL BOWEL RESECTION AND PRIMARY CLOSURE OF VENTRAL HERNIA;  Surgeon: Donnie Mesa, MD;  Location: [place];  Service: General;  Laterality: N/A;  . OVARIAN CYST REMOVAL    . POLYPECTOMY  05/27/2018   Procedure: POLYPECTOMY;  Surgeon: Mauri Pole, MD;  [facility];  Service: Endoscopy;;  . removal of gallstones    . TONSILLECTOMY    . TYMPANOSTOMY TUBE PLACEMENT    . WOUND EXPLORATION  03/18/2019   abdominal  . WOUND EXPLORATION N/A 03/18/2019   Procedure: ABDOMINAL WOUND EXPLORATION;  Surgeon: Donnie Mesa, MD;  Location: [place];  Service: General;  Laterality: N/A;    Review of systems negative except as noted in HPI / PMHx or noted below:  Review of Systems  Constitutional: Negative.   HENT: Negative.   Eyes: Negative.   Respiratory: Negative.   Cardiovascular: Negative.   Gastrointestinal: Negative.   Genitourinary: Negative.   Musculoskeletal: Negative.   Skin: Negative.   Neurological: Negative.   Endo/Heme/Allergies: Negative.   Psychiatric/Behavioral: Negative.      Objective:   Vitals:   05/20/19 1130  BP: 132/86  Pulse: 86  Resp:  20  Temp: 97.9 F (36.6 C)  SpO2: 96%          Physical Exam Constitutional:      Appearance: She is not diaphoretic.  HENT:     Head: Normocephalic.     Right Ear: Tympanic membrane, ear canal and external ear normal.     Left Ear: Tympanic membrane, ear canal and external ear normal.     Nose: Nose normal. No mucosal edema or rhinorrhea.     Mouth/Throat:     Pharynx: Uvula midline. No oropharyngeal exudate.  Eyes:     Conjunctiva/sclera: Conjunctivae normal.  Neck:     Thyroid: No thyromegaly.     Trachea: Trachea normal. No tracheal tenderness or tracheal deviation.  Cardiovascular:     Rate and Rhythm: Normal rate and regular rhythm.     Heart sounds: Normal heart sounds, S1 normal and S2 normal. No murmur.  Pulmonary:     Effort: No respiratory distress.     Breath sounds: Normal breath sounds. No stridor. No wheezing or rales.  Lymphadenopathy:     Head:     Right side of head: No tonsillar adenopathy.     Left side of head: No tonsillar adenopathy.     Cervical: No cervical adenopathy.  Skin:    Findings: No erythema or rash.     Nails: There is no clubbing.  Neurological:     Mental Status: She is alert.     Diagnostics:    Spirometry was performed and demonstrated an FEV1 of 1.99 at 75 % of predicted.  The patient had an Asthma Control Test with the following results: ACT Total Score: 16.    Assessment and Plan:   1. Asthma, severe persistent, well-controlled   2. Other allergic rhinitis   3. LPRD (laryngopharyngeal reflux disease)   4. Other atopic dermatitis   5. Asthma with acute exacerbation, unspecified asthma severity, unspecified whether persistent   6. Seborrheic eczema      1. Continue Dulera 200 2 inhalations twice a day   2. Continue montelukast 10 mg daily  3. Continue Flonase one or 2 sprays each nostril daily  4. Continue Xolair and EpiPen  5. Continue Famotidine 20 mg daily  6. Add  Qvar 80 Redihaler 2 inhalations twice a day  to [facility] while "sick"  7.  Use pro-air HFA and antihistamine and triamcinolone 0.025% ointment if needed  8.  Return to clinic in 12 weeks or earlier if problem  Kelsey Bullock appears to be doing very well at this point in time regarding her respiratory tract and she will continue on a collection of anti-inflammatory agents including use of omalizumab for her respiratory tract.  Her reflux appears to be under very good control as well while just using famotidine.  I will see her back in this clinic in 12 weeks or earlier if there is a problem.  Allena Katz, MD Allergy / Immunology [place]

## 2019-05-21 ENCOUNTER — Encounter: Payer: Self-pay | Admitting: Allergy and Immunology

## 2019-05-21 DIAGNOSIS — T8142XD Infection following a procedure, deep incisional surgical site, subsequent encounter: Secondary | ICD-10-CM | POA: Diagnosis not present

## 2019-05-22 ENCOUNTER — Other Ambulatory Visit: Payer: Self-pay

## 2019-05-22 MED ORDER — FLOVENT HFA 110 MCG/ACT IN AERO: INHALATION_SPRAY | RESPIRATORY_TRACT | 5 refills | Status: DC

## 2019-05-27 DIAGNOSIS — T8142XD Infection following a procedure, deep incisional surgical site, subsequent encounter: Secondary | ICD-10-CM | POA: Diagnosis not present

## 2019-06-04 DIAGNOSIS — T8142XD Infection following a procedure, deep incisional surgical site, subsequent encounter: Secondary | ICD-10-CM | POA: Diagnosis not present

## 2019-06-09 ENCOUNTER — Other Ambulatory Visit: Payer: Self-pay

## 2019-06-09 ENCOUNTER — Ambulatory Visit: Payer: Self-pay

## 2019-06-09 DIAGNOSIS — J4550 Severe persistent asthma, uncomplicated: Secondary | ICD-10-CM | POA: Diagnosis not present

## 2019-06-12 DIAGNOSIS — T8142XD Infection following a procedure, deep incisional surgical site, subsequent encounter: Secondary | ICD-10-CM | POA: Diagnosis not present

## 2019-06-16 DIAGNOSIS — T8142XD Infection following a procedure, deep incisional surgical site, subsequent encounter: Secondary | ICD-10-CM | POA: Diagnosis not present

## 2019-06-16 DIAGNOSIS — F4310 Post-traumatic stress disorder, unspecified: Secondary | ICD-10-CM | POA: Diagnosis not present

## 2019-06-16 DIAGNOSIS — F329 Major depressive disorder, single episode, unspecified: Secondary | ICD-10-CM | POA: Diagnosis not present

## 2019-06-16 DIAGNOSIS — F429 Obsessive-compulsive disorder, unspecified: Secondary | ICD-10-CM | POA: Diagnosis not present

## 2019-06-20 ENCOUNTER — Other Ambulatory Visit: Payer: Self-pay | Admitting: Family Medicine

## 2019-06-24 ENCOUNTER — Other Ambulatory Visit: Payer: Self-pay

## 2019-06-24 ENCOUNTER — Encounter: Payer: Self-pay | Admitting: Family Medicine

## 2019-06-24 ENCOUNTER — Encounter: Payer: Self-pay | Admitting: Allergy and Immunology

## 2019-06-24 ENCOUNTER — Ambulatory Visit: Payer: Medicaid Other | Admitting: Allergy and Immunology

## 2019-06-24 VITALS — BP 132/80 | HR 83 | Temp 97.6°F | Resp 16 | Ht 63.5 in | Wt 298.2 lb

## 2019-06-24 DIAGNOSIS — H0102A Squamous blepharitis right eye, upper and lower eyelids: Secondary | ICD-10-CM | POA: Diagnosis not present

## 2019-06-24 DIAGNOSIS — G4733 Obstructive sleep apnea (adult) (pediatric): Secondary | ICD-10-CM | POA: Diagnosis not present

## 2019-06-24 DIAGNOSIS — K219 Gastro-esophageal reflux disease without esophagitis: Secondary | ICD-10-CM

## 2019-06-24 DIAGNOSIS — J4550 Severe persistent asthma, uncomplicated: Secondary | ICD-10-CM | POA: Diagnosis not present

## 2019-06-24 DIAGNOSIS — H40023 Open angle with borderline findings, high risk, bilateral: Secondary | ICD-10-CM | POA: Diagnosis not present

## 2019-06-24 DIAGNOSIS — H40033 Anatomical narrow angle, bilateral: Secondary | ICD-10-CM | POA: Diagnosis not present

## 2019-06-24 DIAGNOSIS — H0102B Squamous blepharitis left eye, upper and lower eyelids: Secondary | ICD-10-CM | POA: Diagnosis not present

## 2019-06-24 DIAGNOSIS — J3089 Other allergic rhinitis: Secondary | ICD-10-CM

## 2019-06-24 DIAGNOSIS — H47323 Drusen of optic disc, bilateral: Secondary | ICD-10-CM | POA: Diagnosis not present

## 2019-06-24 DIAGNOSIS — L719 Rosacea, unspecified: Secondary | ICD-10-CM | POA: Diagnosis not present

## 2019-06-24 LAB — HM DIABETES EYE EXAM

## 2019-06-24 MED ORDER — IPRATROPIUM BROMIDE 0.06 % NA SOLN: NASAL | 5 refills | Status: DC

## 2019-06-24 NOTE — Patient Instructions (Addendum)
  1. Continue Dulera 200 2 inhalations twice a day   2. Continue montelukast 10 mg daily  3. Continue Flonase one or 2 sprays each nostril daily  4. Continue Xolair and EpiPen  5. Continue Famotidine 20 mg daily  6. Add Qvar 80 Redihaler 2 inhalations twice a day to [facility] while "sick"  7.  Use pro-air HFA and antihistamine and triamcinolone 0.025% ointment if needed  8. Use nasal ipratropium 0.06% - 2 sprays each nostril every 6 hours if needed to dry up nose  9.  Return to clinic in 12 weeks or earlier if problem

## 2019-06-24 NOTE — Progress Notes (Signed)
Kelsey Bullock   Follow-up Note  Referring Provider: [name], MD Primary Provider: [name], MD Date of Office Visit: 06/24/2019  Subjective:   Kelsey Bullock (DOB: 03-21-1961) is a 59 y.o. female who returns to the [facility] on 06/24/2019 in re-evaluation of the following:  HPI: Deniese returns to this clinic in reevaluation of asthma and allergic rhinitis and reflux and a history of untreated sleep apnea and suspected obesity hypoventilation syndrome.  Her last contact with this clinic was 20 May 2019.  She has continued to do very well with her airway issue after sustaining a very severe Covid pneumonitis event earlier in the winter.  At this point she does not need to use a short acting bronchodilator greater than 2 times per week.  She continues to use her Dulera and her montelukast on a consistent basis and continues to receive omalizumab injections.  Her nose has really been doing quite well while using a nasal steroid.  She does have some rhinorrhea on occasion and she would like to use her nasal ipratropium once again.  She believes that her reflux is under very good control while using famotidine.  She continues to lose weight voluntarily.  Allergies as of 06/24/2019      Reactions   Amoxicillin Anaphylaxis, Swelling   Etodolac Anaphylaxis, Shortness Of Breath    lips swelled   Penicillins Anaphylaxis, Swelling   DID THE REACTION INVOLVE: Swelling of the face/tongue/throat, SOB, or low BP? Yes Sudden or severe rash/hives, skin peeling, or the inside of the mouth or nose? No Did it require medical treatment? No When did it last happen?20 years ago If all above answers are "NO", may proceed with cephalosporin use.   Ampicillin Swelling   Augmentin [amoxicillin-pot Clavulanate] Swelling   Diclofenac Sodium    Unknown reaction    Pantoprazole Sodium    unspecified   Adhesive [tape]  Rash      Medication List    acetaminophen 500 MG tablet Commonly known as: TYLENOL Take 1,000 mg by mouth 2 (two) times daily as needed for moderate pain or headache.   albuterol 108 (90 Base) MCG/ACT inhaler Commonly known as: VENTOLIN HFA Inhale 2 puffs into the lungs every 4 (four) hours as needed.   albuterol (2.5 MG/3ML) 0.083% nebulizer solution Commonly known as: PROVENTIL Take 3 mLs (2.5 mg total) by nebulization every 4 (four) hours as needed. USE 1 VIAL VIA NEBULIZER EVERY 4 TO 6 HOURS AS NEEDED FOR COUGH OR WHEEZING   atorvastatin 40 MG tablet Commonly known as: LIPITOR Take 1 tablet (40 mg total) by mouth at bedtime.   B12 FOLATE PO Take by mouth.   calcium carbonate 500 MG chewable tablet Commonly known as: TUMS - dosed in mg elemental calcium Chew 2-3 tablets by mouth daily as needed for indigestion or heartburn.   cetirizine 10 MG tablet Commonly known as: ZYRTEC Take 1 tablet (10 mg total) by mouth daily.   CINNAMON PO Take 1 tablet by mouth daily as needed (for high bp).   clonazePAM 1 MG tablet Commonly known as: KLONOPIN Take 1 mg by mouth 2 (two) times daily as needed for anxiety.   COLLAGEN PO Take by mouth.   Cranberry 1000 MG Caps Take 1,000 mg by mouth daily.   cyclobenzaprine 10 MG tablet Commonly known as: FLEXERIL TAKE 1 TABLET BY MOUTH AT BEDTIME AS NEEDED FOR BACK OR NECK PAIN OR  SPASMS   Dulera 200-5 MCG/ACT Aero Generic drug: mometasone-formoterol Inhale 2 puffs into the lungs 2 (two) times daily.   EPINEPHrine 0.3 mg/0.3 mL Soaj injection Commonly known as: EpiPen 2-Pak Inject 0.3 mLs (0.3 mg total) into the muscle once for 1 dose.   erythromycin ophthalmic ointment Place 1 application into both eyes at bedtime.   famotidine 20 MG tablet Commonly known as: PEPCID Take 1 tablet (20 mg total) by mouth 2 (two) times daily.   fluticasone 50 MCG/ACT nasal spray Commonly known as: FLONASE Place 2 sprays into both nostrils  daily.   furosemide 40 MG tablet Commonly known as: LASIX Take 1 tablet (40 mg total) by mouth daily as needed for fluid or edema (swelling).   lisinopril 10 MG tablet Commonly known as: ZESTRIL Take 1 tablet (10 mg total) by mouth at bedtime.   mirtazapine 45 MG tablet Commonly known as: REMERON Take 1 at night   montelukast 10 MG tablet Commonly known as: SINGULAIR Take 1 tablet (10 mg total) by mouth at bedtime.   norethindrone 5 MG tablet Commonly known as: AYGESTIN Take 2.5 mg by mouth at bedtime.   nystatin powder Generic drug: nystatin APPLY TOPICALLY TO THE AFFECTED AREA THREE TIMES DAILY   Pazeo 0.7 % Soln Generic drug: Olopatadine HCl Place 1 drop into both eyes at bedtime.   Qvar RediHaler 80 MCG/ACT inhaler Generic drug: beclomethasone Inhale 2 puffs into the lungs 2 (two) times daily.   sterile water (preservative free) injection Inject 10 mLs as directed as directed.   triamcinolone 0.025 % ointment Commonly known as: KENALOG Apply topically 2 (two) times daily.   vitamin C 1000 MG tablet Take 1,000 mg by mouth daily.   Xolair 150 MG injection Generic drug: omalizumab INJECT 300 MG UNDER THE SKIN EVERY 28 DAYS   ZINC ACETATE PO Take by mouth.   Zinc Lozg Take 1 lozenge by mouth daily.       Past Medical History:  Diagnosis Date  . Allergic rhinitis   . Anemia   . Anxiety   . Arthritis    knees and back  . Asthma   . Complication of anesthesia   . Congestive heart failure (CHF) ([place])   . Depression   . Gall stone   . GERD (gastroesophageal reflux disease)   . Headache    migraines, stress headaches  . History of kidney stones   . Hypertension   . Hyperthyroidism    during pregnancy  . Pneumonia   . PONV (postoperative nausea and vomiting)   . Sleep apnea    can't use Cpap    Past Surgical History:  Procedure Laterality Date  . ADENOIDECTOMY    . APPLICATION OF WOUND VAC  03/18/2019  . APPLICATION OF WOUND VAC N/A  03/18/2019   Procedure: Application Of Wound Vac;  Surgeon: Donnie Mesa, MD;  Location: [place];  Service: General;  Laterality: N/A;  . BIOPSY  05/27/2018   Procedure: BIOPSY;  Surgeon: Mauri Pole, MD;  [facility];  Service: Endoscopy;;  . CESAREAN SECTION    . COLONOSCOPY WITH PROPOFOL N/A 05/27/2018   Procedure: COLONOSCOPY WITH PROPOFOL;  Surgeon: Mauri Pole, MD;  [facility];  Service: Endoscopy;  Laterality: N/A;  . CYST EXCISION    . EXPLORATORY LAPAROTOMY  02/03/2019  . INGUINAL HERNIA REPAIR N/A 02/03/2019   Procedure: EXPLORATORY LAPAROTOMY WITH SMALL BOWEL RESECTION AND PRIMARY CLOSURE OF VENTRAL HERNIA;  Surgeon: Donnie Mesa, MD;  Location:  [facility];  Service: General;  Laterality: N/A;  . OVARIAN CYST REMOVAL    . POLYPECTOMY  05/27/2018   Procedure: POLYPECTOMY;  Surgeon: Mauri Pole, MD;  [facility];  Service: Endoscopy;;  . removal of gallstones    . TONSILLECTOMY    . TYMPANOSTOMY TUBE PLACEMENT    . WOUND EXPLORATION  03/18/2019   abdominal  . WOUND EXPLORATION N/A 03/18/2019   Procedure: ABDOMINAL WOUND EXPLORATION;  Surgeon: Donnie Mesa, MD;  Location: [place];  Service: General;  Laterality: N/A;    Review of systems negative except as noted in HPI / PMHx or noted below:  Review of Systems  Constitutional: Negative.   HENT: Negative.   Eyes: Negative.   Respiratory: Negative.   Cardiovascular: Negative.   Gastrointestinal: Negative.   Genitourinary: Negative.   Musculoskeletal: Negative.   Skin: Negative.   Neurological: Negative.   Endo/Heme/Allergies: Negative.   Psychiatric/Behavioral: Negative.      Objective:   Vitals:   06/24/19 1351  BP: 132/80  Pulse: 83  Resp: 16  Temp: 97.6 F (36.4 C)  SpO2: 98%   Height: 5' 3.5" (161.3 cm)  Weight: 298 lb 3.2 oz (135.3 kg)   Physical Exam Constitutional:      Appearance: She is not diaphoretic.  HENT:     Head: Normocephalic.      Right Ear: Tympanic membrane, ear canal and external ear normal.     Left Ear: Tympanic membrane, ear canal and external ear normal.     Nose: Nose normal. No mucosal edema or rhinorrhea.     Mouth/Throat:     Pharynx: Uvula midline. No oropharyngeal exudate.  Eyes:     Conjunctiva/sclera: Conjunctivae normal.  Neck:     Thyroid: No thyromegaly.     Trachea: Trachea normal. No tracheal tenderness or tracheal deviation.  Cardiovascular:     Rate and Rhythm: Normal rate and regular rhythm.     Heart sounds: Normal heart sounds, S1 normal and S2 normal. No murmur.  Pulmonary:     Effort: No respiratory distress.     Breath sounds: Normal breath sounds. No stridor. No wheezing or rales.  Lymphadenopathy:     Head:     Right side of head: No tonsillar adenopathy.     Left side of head: No tonsillar adenopathy.     Cervical: No cervical adenopathy.  Skin:    Findings: No erythema or rash.     Nails: There is no clubbing.  Neurological:     Mental Status: She is alert.     Diagnostics:    Spirometry was performed and demonstrated an FEV1 of 2.03 at 77 % of predicted.  Assessment and Plan:   1. Asthma, severe persistent, well-controlled   2. Other allergic rhinitis   3. LPRD (laryngopharyngeal reflux disease)   4. Obstructive sleep apnea syndrome      1. Continue Dulera 200 2 inhalations twice a day   2. Continue montelukast 10 mg daily  3. Continue Flonase one or 2 sprays each nostril daily  4. Continue Xolair and EpiPen  5. Continue Famotidine 20 mg daily  6. Add Qvar 80 Redihaler 2 inhalations twice a day to [facility] while "sick"  7.  Use pro-air HFA and antihistamine and triamcinolone 0.025% ointment if needed  8. Use nasal ipratropium 0.06% - 2 sprays each nostril every 6 hours if needed to dry up nose  9.  Return to clinic in 12 weeks or earlier if problem  Mame continues to do very well on her current medical plan.  She will remain on anti-inflammatory agents  for her airway including the use of omalizumab and continue to use a H2 receptor blocker for her reflux as noted above.  I given her some nasal ipratropium to deal with her rhinorrhea.  Assuming she continues to do well with this plan I will see her back in this clinic in 12 weeks or earlier if there is a problem.  Allena Katz, MD Allergy / Immunology [place]

## 2019-06-25 ENCOUNTER — Encounter: Payer: Self-pay | Admitting: Allergy and Immunology

## 2019-06-27 DIAGNOSIS — T8142XD Infection following a procedure, deep incisional surgical site, subsequent encounter: Secondary | ICD-10-CM | POA: Diagnosis not present

## 2019-06-30 DIAGNOSIS — T8142XD Infection following a procedure, deep incisional surgical site, subsequent encounter: Secondary | ICD-10-CM | POA: Diagnosis not present

## 2019-07-07 ENCOUNTER — Other Ambulatory Visit: Payer: Self-pay

## 2019-07-07 ENCOUNTER — Ambulatory Visit: Payer: Self-pay | Admitting: *Deleted

## 2019-07-07 DIAGNOSIS — J4550 Severe persistent asthma, uncomplicated: Secondary | ICD-10-CM | POA: Diagnosis not present

## 2019-07-07 DIAGNOSIS — T8142XD Infection following a procedure, deep incisional surgical site, subsequent encounter: Secondary | ICD-10-CM | POA: Diagnosis not present

## 2019-07-11 ENCOUNTER — Other Ambulatory Visit: Payer: Self-pay | Admitting: Family Medicine

## 2019-07-15 DIAGNOSIS — T8142XD Infection following a procedure, deep incisional surgical site, subsequent encounter: Secondary | ICD-10-CM | POA: Diagnosis not present

## 2019-07-16 ENCOUNTER — Ambulatory Visit (INDEPENDENT_AMBULATORY_CARE_PROVIDER_SITE_OTHER): Payer: Medicaid Other | Admitting: Family Medicine

## 2019-07-16 ENCOUNTER — Encounter: Payer: Self-pay | Admitting: Family Medicine

## 2019-07-16 ENCOUNTER — Other Ambulatory Visit: Payer: Self-pay

## 2019-07-16 DIAGNOSIS — L659 Nonscarring hair loss, unspecified: Secondary | ICD-10-CM | POA: Diagnosis not present

## 2019-07-16 DIAGNOSIS — G4730 Sleep apnea, unspecified: Secondary | ICD-10-CM | POA: Diagnosis not present

## 2019-07-16 DIAGNOSIS — I5030 Unspecified diastolic (congestive) heart failure: Secondary | ICD-10-CM

## 2019-07-16 DIAGNOSIS — F3340 Major depressive disorder, recurrent, in remission, unspecified: Secondary | ICD-10-CM | POA: Diagnosis not present

## 2019-07-16 DIAGNOSIS — L089 Local infection of the skin and subcutaneous tissue, unspecified: Secondary | ICD-10-CM

## 2019-07-16 DIAGNOSIS — I10 Essential (primary) hypertension: Secondary | ICD-10-CM

## 2019-07-16 NOTE — Progress Notes (Signed)
   CHIEF COMPLAINT / HPI:  Multiple issues: 1. Patient had a long slow healing of a post op abd wound infection.  Finally healed.  2. Fully recovered from [place] last September 3. Continues on very healthy raw vegetable and lean chicken diet.  With her illnesses and diet, she has lost ~200 lbs in one year.  Last blood work did not show and resulting imbalances. 4. Chronic depression has improved. She is proud of herself. 5.  She has gone from diabetic to prediabetic to non diabetic.  Clearly a testament to the power of weight loss. 6. Also carries the diagnoses of hypertension and diastolic CHF.  Was on prn lasix based on ankle edema.  Was on lisinopril for both DM renal protection and hypertension.     OBJECTIVE: BP (!) 104/58   Pulse 82   Wt 291 lb 12.8 oz (132.4 kg)   LMP 02/03/2019   SpO2 97%   BMI 50.88 kg/m   Low BP noted Lungs clear Cardiac RRR without m or g Abd scar is well healed. Skin, lots of redundant skin. Ext no pitting edema of ankles.  ASSESSMENT / PLAN:  Sleep apnea Never able to tolerate mask.  Likely has resolved this diagnosis with the weight loss.  Snores much less.  Chronic abdominal wound infection Finally healed.  Depression, major, recurrent ([place]) Improved despite her recent illnesses.  She is proud of her weight loss accomplishments.  Hair loss Likely due to recent illnesses.  Check TSH.  HYPERTENSION, BENIGN SYSTEMIC Seemingly resolved with weight loss.  DC lasix and lisinopril  Diastolic CHF with preserved left ventricular function, NYHA class 2 ([place]) I am not sure if this has fully resolved with weight loss.  No longer seems to need prn lasix.  Morbid obesity ([facility]) BMI still 50 - and she has lost ~200 lbs.  She intends to remain on her current diet.  Lots of redundant skin from large weight loss.     [name], MD [place]

## 2019-07-16 NOTE — Assessment & Plan Note (Signed)
Likely due to recent illnesses.  Check TSH.

## 2019-07-16 NOTE — Patient Instructions (Addendum)
Stop the fluid pill (furosemide) and the blood pressure pill (lisinopril.) You no longer are prediabetic.   I will call with the blood test results.

## 2019-07-16 NOTE — Assessment & Plan Note (Signed)
I am not sure if this has fully resolved with weight loss.  No longer seems to need prn lasix.

## 2019-07-16 NOTE — Assessment & Plan Note (Signed)
Finally healed.

## 2019-07-16 NOTE — Assessment & Plan Note (Signed)
Seemingly resolved with weight loss.  DC lasix and lisinopril

## 2019-07-16 NOTE — Assessment & Plan Note (Signed)
Improved despite her recent illnesses.  She is proud of her weight loss accomplishments.

## 2019-07-16 NOTE — Assessment & Plan Note (Addendum)
Never able to tolerate mask.  Likely has resolved this diagnosis with the weight loss.  Snores much less.

## 2019-07-16 NOTE — Assessment & Plan Note (Signed)
BMI still 50 - and she has lost ~200 lbs.  She intends to remain on her current diet.  Lots of redundant skin from large weight loss.

## 2019-07-17 LAB — TSH: TSH: 0.899 u[IU]/mL (ref 0.450–4.500)

## 2019-08-04 ENCOUNTER — Ambulatory Visit: Payer: Self-pay

## 2019-08-04 ENCOUNTER — Other Ambulatory Visit: Payer: Self-pay

## 2019-08-04 DIAGNOSIS — J4550 Severe persistent asthma, uncomplicated: Secondary | ICD-10-CM | POA: Diagnosis not present

## 2019-09-01 ENCOUNTER — Encounter: Payer: Self-pay | Admitting: Family Medicine

## 2019-09-01 ENCOUNTER — Ambulatory Visit (INDEPENDENT_AMBULATORY_CARE_PROVIDER_SITE_OTHER): Payer: Self-pay

## 2019-09-01 ENCOUNTER — Ambulatory Visit: Payer: Medicaid Other | Admitting: Family Medicine

## 2019-09-01 ENCOUNTER — Other Ambulatory Visit: Payer: Self-pay

## 2019-09-01 DIAGNOSIS — I5030 Unspecified diastolic (congestive) heart failure: Secondary | ICD-10-CM

## 2019-09-01 DIAGNOSIS — R7303 Prediabetes: Secondary | ICD-10-CM | POA: Diagnosis not present

## 2019-09-01 DIAGNOSIS — K5900 Constipation, unspecified: Secondary | ICD-10-CM

## 2019-09-01 DIAGNOSIS — J4550 Severe persistent asthma, uncomplicated: Secondary | ICD-10-CM

## 2019-09-01 DIAGNOSIS — F3340 Major depressive disorder, recurrent, in remission, unspecified: Secondary | ICD-10-CM

## 2019-09-01 LAB — POCT GLYCOSYLATED HEMOGLOBIN (HGB A1C): HbA1c, POC (controlled diabetic range): 6 % (ref 0.0–7.0)

## 2019-09-01 MED ORDER — POLYETHYLENE GLYCOL 3350 17 GM/SCOOP PO POWD: 17.0000 g | Freq: Two times a day (BID) | ORAL | 5 refills | Status: DC | PRN

## 2019-09-01 NOTE — Patient Instructions (Addendum)
I sent in a prescription for the miralax.  I hope Medicaid covers. Great job on the continued weight loss.   Keep working to build your strength back. You may no longer be a diabetic.  I will discuss when I call you with the blood test results. I will fill out the paperwork and send to [facility].   The nurse will get you to sign two release of information forms.

## 2019-09-01 NOTE — Assessment & Plan Note (Addendum)
A1C in prediabetic range.  Hopefully, she will drop this as her impressive weight loss continues.

## 2019-09-03 ENCOUNTER — Encounter: Payer: Self-pay | Admitting: Family Medicine

## 2019-09-03 NOTE — Assessment & Plan Note (Signed)
Rx for miralax

## 2019-09-03 NOTE — Assessment & Plan Note (Signed)
Stable on current meds 

## 2019-09-03 NOTE — Assessment & Plan Note (Signed)
Stable off antihypertensives and diuretics.

## 2019-09-03 NOTE — Progress Notes (Signed)
    SUBJECTIVE:   CHIEF COMPLAINT / HPI:   Multiple issues 1. I believe she has fully recovered from her surgery for incarcerated hernia, then COVID, then wound dehiscence.  She is slowly regaining her strength.  She does as that I fill out some [employer] forms so that she can get [employer] transportation.  No longer has a functional car.  Cannot stand long at the bus stop. 2. Morbid obesity.  Her impressive slow wt loss continues.  She is down another 9 pounds.  Mostly done through diet.  She is gradually increasing her activiity. 3. Chronic constipation.  Asks for Rx for miralax, which she takes for her constipation.   4. Hypertensive in the past.  Not on any meds.  BP great.  I will drop from problem list. 5. Asthma.  Spring is hisptorically a bad time for her.  Fortunately, no wheezing or dyspnea.  States compliant with her meds. 6. Was diabetic.  No longer on meds with great wt loss. 7.  Hx of diastolic failure.  Chronic leg swelling and DOE are at her baseline.    OBJECTIVE:   BP 110/62   Pulse 88   Ht 5\' 4"  (1.626 m)   Wt 282 lb 8 oz (128.1 kg)   LMP 02/03/2019   SpO2 98%   BMI 48.49 kg/m   Lungs clear, no wheeze. VS noted.   Abd benign.  Wound healed.    ASSESSMENT/PLAN:   Prediabetes A1C in prediabetic range.  Hopefully, she will drop this as her impressive weight loss continues.  Constipation Rx for miralax.  Diastolic CHF with preserved left ventricular function, NYHA class 2 (HCC) Stable off antihypertensives and diuretics.    Morbid obesity ([place]) Continued great improvement.  Has lost at least 150 lbs.    Severe persistent asthma Stable on current meds.     Kelsey Resides, MD [place]

## 2019-09-03 NOTE — Assessment & Plan Note (Signed)
Continued great improvement.  Has lost at least 150 lbs.

## 2019-09-16 ENCOUNTER — Ambulatory Visit: Payer: Medicaid Other | Admitting: Allergy and Immunology

## 2019-09-16 ENCOUNTER — Other Ambulatory Visit: Payer: Self-pay

## 2019-09-16 ENCOUNTER — Encounter: Payer: Self-pay | Admitting: Allergy and Immunology

## 2019-09-16 VITALS — BP 126/82 | HR 80 | Temp 98.1°F | Resp 16 | Ht 64.0 in | Wt 274.2 lb

## 2019-09-16 DIAGNOSIS — K219 Gastro-esophageal reflux disease without esophagitis: Secondary | ICD-10-CM | POA: Diagnosis not present

## 2019-09-16 DIAGNOSIS — J4550 Severe persistent asthma, uncomplicated: Secondary | ICD-10-CM

## 2019-09-16 DIAGNOSIS — J3089 Other allergic rhinitis: Secondary | ICD-10-CM | POA: Diagnosis not present

## 2019-09-16 NOTE — Progress Notes (Signed)
[place]   Follow-up Note  Referring Provider: [name], MD Primary Provider: [name], MD Date of Office Visit: 09/16/2019  Subjective:   Kelsey Bullock (DOB: 27-Jan-1961) is a 59 y.o. female who returns to [facility] on 09/16/2019 in re-evaluation of the following:  HPI: Kelsey Bullock returns to this clinic in evaluation of asthma and allergic rhinitis and reflux and a history of suspected obesity hypoventilation syndrome.  Her last visit to this clinic was 24 June 2019.  She has continued to lose weight and she is now down to 274 pounds.  Her airway is doing wonderful.  She has very little issues with her nose and very little issues with her chest.  Her requirement for short acting bronchodilator is twice a week.  She continues to use [place] and Flonase twice a day.  Her reflux is under very good control while using famotidine at this point in time.  She has received her first [employer] vaccine.  She did experience a very severe native Covid pneumonitis earlier in the winter.  Allergies as of 09/16/2019      Reactions   Amoxicillin Anaphylaxis, Swelling   Etodolac Anaphylaxis, Shortness Of Breath    lips swelled   Penicillins Anaphylaxis, Swelling   DID THE REACTION INVOLVE: Swelling of the face/tongue/throat, SOB, or low BP? Yes Sudden or severe rash/hives, skin peeling, or the inside of the mouth or nose? No Did it require medical treatment? No When did it last happen?20 years ago If all above answers are "NO", may proceed with cephalosporin use.   Ampicillin Swelling   Augmentin [amoxicillin-pot Clavulanate] Swelling   Diclofenac Sodium    Unknown reaction    Pantoprazole Sodium    unspecified   Adhesive [tape] Rash      Medication List      acetaminophen 500 MG tablet Commonly known as: TYLENOL Take 1,000 mg by mouth 2 (two) times daily as needed for moderate pain or  headache.   albuterol 108 (90 Base) MCG/ACT inhaler Commonly known as: VENTOLIN HFA Inhale 2 puffs into the lungs every 4 (four) hours as needed.   albuterol (2.5 MG/3ML) 0.083% nebulizer solution Commonly known as: PROVENTIL Take 3 mLs (2.5 mg total) by nebulization every 4 (four) hours as needed. USE 1 VIAL VIA NEBULIZER EVERY 4 TO 6 HOURS AS NEEDED FOR COUGH OR WHEEZING   atorvastatin 40 MG tablet Commonly known as: LIPITOR TAKE 1 TABLET(40 MG) BY MOUTH AT BEDTIME   B12 FOLATE PO Take by mouth.   calcium carbonate 500 MG chewable tablet Commonly known as: TUMS - dosed in mg elemental calcium Chew 2-3 tablets by mouth daily as needed for indigestion or heartburn.   cetirizine 10 MG tablet Commonly known as: ZYRTEC Take 1 tablet (10 mg total) by mouth daily.   CINNAMON PO Take 1 tablet by mouth daily as needed (for high bp).   COLLAGEN PO Take by mouth.   Cranberry 1000 MG Caps Take 1,000 mg by mouth daily.   cyclobenzaprine 10 MG tablet Commonly known as: FLEXERIL TAKE 1 TABLET BY MOUTH AT BEDTIME AS NEEDED FOR BACK OR NECK PAIN OR SPASMS   Dulera 200-5 MCG/ACT Aero Generic drug: mometasone-formoterol Inhale 2 puffs into the lungs 2 (two) times daily.   EPINEPHrine 0.3 mg/0.3 mL Soaj injection Commonly known as: EpiPen 2-Pak Inject 0.3 mLs (0.3 mg total) into the muscle once for 1 dose.  famotidine 20 MG tablet Commonly known as: PEPCID Take 1 tablet (20 mg total) by mouth 2 (two) times daily.   fluticasone 50 MCG/ACT nasal spray Commonly known as: FLONASE Place 2 sprays into both nostrils daily.   ipratropium 0.06 % nasal spray Commonly known as: ATROVENT Use 2 sprays in each nostril every 6 hours if needed to dry up nose   mirtazapine 45 MG tablet Commonly known as: REMERON Take 1 at night   montelukast 10 MG tablet Commonly known as: SINGULAIR Take 1 tablet (10 mg total) by mouth at bedtime.   norethindrone 5 MG tablet Commonly known as:  AYGESTIN Take 2.5 mg by mouth at bedtime.   nystatin powder Generic drug: nystatin APPLY TOPICALLY TO THE AFFECTED AREA THREE TIMES DAILY   Pazeo 0.7 % Soln Generic drug: Olopatadine HCl Place 1 drop into both eyes at bedtime.   polyethylene glycol powder 17 GM/SCOOP powder Commonly known as: GLYCOLAX/MIRALAX Take 17 g by mouth 2 (two) times daily as needed.   Qvar RediHaler 80 MCG/ACT inhaler Generic drug: beclomethasone Inhale 2 puffs into the lungs 2 (two) times daily.   triamcinolone 0.025 % ointment Commonly known as: KENALOG Apply topically 2 (two) times daily.   vitamin C 1000 MG tablet Take 1,000 mg by mouth daily.   Xolair 150 MG injection Generic drug: omalizumab INJECT 300 MG UNDER THE SKIN EVERY 28 DAYS   ZINC ACETATE PO Take by mouth.   Zinc Lozg Take 1 lozenge by mouth daily.       Past Medical History:  Diagnosis Date  . Allergic rhinitis   . Anemia   . Anxiety   . Arthritis    knees and back  . Asthma   . Complication of anesthesia   . Congestive heart failure (CHF) ([place])   . Depression   . Gall stone   . GERD (gastroesophageal reflux disease)   . Headache    migraines, stress headaches  . History of kidney stones   . Hypertension   . Hyperthyroidism    during pregnancy  . Pneumonia   . PONV (postoperative nausea and vomiting)   . Sleep apnea    can't use Cpap    Past Surgical History:  Procedure Laterality Date  . ADENOIDECTOMY    . APPLICATION OF WOUND VAC  03/18/2019  . APPLICATION OF WOUND VAC N/A 03/18/2019   Procedure: Application Of Wound Vac;  Surgeon: Donnie Mesa, MD;  Location: [facility];  Service: General;  Laterality: N/A;  . BIOPSY  05/27/2018   Procedure: BIOPSY;  Surgeon: Mauri Pole, MD;  Location: [facility];  Service: Endoscopy;;  . CESAREAN SECTION    . COLONOSCOPY WITH PROPOFOL N/A 05/27/2018   Procedure: COLONOSCOPY WITH PROPOFOL;  Surgeon: Mauri Pole, MD;  Location: [facility];   Service: Endoscopy;  Laterality: N/A;  . CYST EXCISION    . EXPLORATORY LAPAROTOMY  02/03/2019  . INGUINAL HERNIA REPAIR N/A 02/03/2019   Procedure: EXPLORATORY LAPAROTOMY WITH SMALL BOWEL RESECTION AND PRIMARY CLOSURE OF VENTRAL HERNIA;  Surgeon: Donnie Mesa, MD;  Location: [facility];  Service: General;  Laterality: N/A;  . OVARIAN CYST REMOVAL    . POLYPECTOMY  05/27/2018   Procedure: POLYPECTOMY;  Surgeon: Mauri Pole, MD;  Location: [facility];  Service: Endoscopy;;  . removal of gallstones    . TONSILLECTOMY    . TYMPANOSTOMY TUBE PLACEMENT    . WOUND EXPLORATION  03/18/2019   abdominal  . WOUND EXPLORATION N/A 03/18/2019  Procedure: ABDOMINAL WOUND EXPLORATION;  Surgeon: Donnie Mesa, MD;  Location: [facility];  Service: General;  Laterality: N/A;    Review of systems negative except as noted in HPI / PMHx or noted below:  Review of Systems  Constitutional: Negative.   HENT: Negative.   Eyes: Negative.   Respiratory: Negative.   Cardiovascular: Negative.   Gastrointestinal: Negative.   Genitourinary: Negative.   Musculoskeletal: Negative.   Skin: Negative.   Neurological: Negative.   Endo/Heme/Allergies: Negative.   Psychiatric/Behavioral: Negative.      Objective:   Vitals:   09/16/19 1028  BP: 126/82  Pulse: 80  Resp: 16  Temp: 98.1 F (36.7 C)  SpO2: 96%   Height: 5\' 4"  (162.6 cm)  Weight: 274 lb 3.2 oz (124.4 kg)   Physical Exam Constitutional:      Appearance: She is not diaphoretic.  HENT:     Head: Normocephalic.     Right Ear: Tympanic membrane, ear canal and external ear normal.     Left Ear: Tympanic membrane, ear canal and external ear normal.     Nose: Nose normal. No mucosal edema or rhinorrhea.     Mouth/Throat:     Pharynx: Uvula midline. No oropharyngeal exudate.  Eyes:     Conjunctiva/sclera: Conjunctivae normal.  Neck:     Thyroid: No thyromegaly.     Trachea: Trachea normal. No tracheal tenderness or tracheal deviation.    Cardiovascular:     Rate and Rhythm: Normal rate and regular rhythm.     Heart sounds: Normal heart sounds, S1 normal and S2 normal. No murmur.  Pulmonary:     Effort: No respiratory distress.     Breath sounds: Normal breath sounds. No stridor. No wheezing or rales.  Lymphadenopathy:     Head:     Right side of head: No tonsillar adenopathy.     Left side of head: No tonsillar adenopathy.     Cervical: No cervical adenopathy.  Skin:    Findings: No erythema or rash.     Nails: There is no clubbing.  Neurological:     Mental Status: She is alert.     Diagnostics:    Spirometry was performed and demonstrated an FEV1 of 2.23 at 88 % of predicted.  The patient had an Asthma Control Test with the following results: ACT Total Score: 21.    Assessment and Plan:   1. Asthma, severe persistent, well-controlled   2. Other allergic rhinitis   3. LPRD (laryngopharyngeal reflux disease)      1. Continue Dulera 200 2 inhalations 1-2 times per day depending on asthma activity  2. Continue Flonase 1 spray each nostril 1-2 times per day depending on upper airway symptom activity  3. Continue montelukast 10 mg daily  4. Continue Xolair and EpiPen  5. Continue Famotidine 20 mg daily  6. Add Qvar 80 Redihaler 2 inhalations twice a day to [facility] while "sick"  7.  Use pro-air HFA and antihistamine and triamcinolone 0.025% ointment if needed  8. Use nasal ipratropium 0.06% - 2 sprays each nostril every 6 hours if needed to dry up nose  9.  Return to clinic in 6 months or earlier if problem  Yeiri is really doing quite well and we will see if we can further consolidate her medical treatment by decreasing her Dulera and Flonase as noted above.  She can always increase to twice a day utilization of both of these agents should she have problems in the face of  this tapering.  She will remain on omalizumab and remain on a leukotriene modifier as her other anti-inflammatory agents for her  airway.  She will continue on famotidine to treat her reflux.  Assuming she does well I will see her back in this clinic in 6 months or earlier if there is a problem.  Allena Katz, MD Allergy / Immunology [place]

## 2019-09-16 NOTE — Patient Instructions (Signed)
  1. Continue Dulera 200 2 inhalations 1-2 times per day depending on asthma activity  2. Continue Flonase 1 spray each nostril 1-2 times per day depending on upper airway symptom activity  3. Continue montelukast 10 mg daily  4. Continue Xolair and EpiPen  5. Continue Famotidine 20 mg daily  6. Add Qvar 80 Redihaler 2 inhalations twice a day to [facility] while "sick"  7.  Use pro-air HFA and antihistamine and triamcinolone 0.025% ointment if needed  8. Use nasal ipratropium 0.06% - 2 sprays each nostril every 6 hours if needed to dry up nose  9.  Return to clinic in 6 months or earlier if problem

## 2019-09-17 ENCOUNTER — Encounter: Payer: Self-pay | Admitting: Allergy and Immunology

## 2019-09-28 ENCOUNTER — Other Ambulatory Visit: Payer: Self-pay | Admitting: Allergy and Immunology

## 2019-09-29 ENCOUNTER — Other Ambulatory Visit: Payer: Self-pay

## 2019-09-29 ENCOUNTER — Ambulatory Visit: Payer: Self-pay

## 2019-09-29 MED ORDER — IPRATROPIUM BROMIDE 0.06 % NA SOLN: NASAL | 5 refills | Status: DC

## 2019-09-29 MED ORDER — DULERA 200-5 MCG/ACT IN AERO: 2.0000 | INHALATION_SPRAY | Freq: Two times a day (BID) | RESPIRATORY_TRACT | 5 refills | Status: DC

## 2019-09-29 MED ORDER — MONTELUKAST SODIUM 10 MG PO TABS: 10.0000 mg | ORAL_TABLET | Freq: Every day | ORAL | 5 refills | Status: DC

## 2019-09-29 MED ORDER — QVAR REDIHALER 80 MCG/ACT IN AERB: 2.0000 | INHALATION_SPRAY | Freq: Two times a day (BID) | RESPIRATORY_TRACT | 5 refills | Status: DC

## 2019-09-30 ENCOUNTER — Telehealth: Payer: Self-pay | Admitting: *Deleted

## 2019-09-30 NOTE — Telephone Encounter (Signed)
PA has been submitted through [facility] [employer] for QVAR and 80 and has been approved. PA approval form has been faxed to patient's pharmacy, labeled, and placed in bulk scanning.

## 2019-10-01 ENCOUNTER — Other Ambulatory Visit: Payer: Self-pay

## 2019-10-01 ENCOUNTER — Ambulatory Visit: Payer: Self-pay

## 2019-10-01 DIAGNOSIS — J4550 Severe persistent asthma, uncomplicated: Secondary | ICD-10-CM

## 2019-10-28 ENCOUNTER — Ambulatory Visit: Payer: Self-pay

## 2019-10-28 ENCOUNTER — Other Ambulatory Visit: Payer: Self-pay

## 2019-10-28 DIAGNOSIS — J4550 Severe persistent asthma, uncomplicated: Secondary | ICD-10-CM | POA: Diagnosis not present

## 2019-10-29 ENCOUNTER — Ambulatory Visit: Payer: Self-pay

## 2019-11-10 DIAGNOSIS — H00025 Hordeolum internum left lower eyelid: Secondary | ICD-10-CM | POA: Diagnosis not present

## 2019-11-25 ENCOUNTER — Ambulatory Visit: Payer: Self-pay

## 2019-11-25 ENCOUNTER — Other Ambulatory Visit: Payer: Self-pay

## 2019-11-25 DIAGNOSIS — J4550 Severe persistent asthma, uncomplicated: Secondary | ICD-10-CM

## 2019-12-01 DIAGNOSIS — H0102A Squamous blepharitis right eye, upper and lower eyelids: Secondary | ICD-10-CM | POA: Diagnosis not present

## 2019-12-01 DIAGNOSIS — H0102B Squamous blepharitis left eye, upper and lower eyelids: Secondary | ICD-10-CM | POA: Diagnosis not present

## 2019-12-01 DIAGNOSIS — H00025 Hordeolum internum left lower eyelid: Secondary | ICD-10-CM | POA: Diagnosis not present

## 2019-12-04 DIAGNOSIS — F4310 Post-traumatic stress disorder, unspecified: Secondary | ICD-10-CM | POA: Diagnosis not present

## 2019-12-04 DIAGNOSIS — F429 Obsessive-compulsive disorder, unspecified: Secondary | ICD-10-CM | POA: Diagnosis not present

## 2019-12-04 DIAGNOSIS — F329 Major depressive disorder, single episode, unspecified: Secondary | ICD-10-CM | POA: Diagnosis not present

## 2019-12-22 DIAGNOSIS — L719 Rosacea, unspecified: Secondary | ICD-10-CM | POA: Diagnosis not present

## 2019-12-22 DIAGNOSIS — H0102A Squamous blepharitis right eye, upper and lower eyelids: Secondary | ICD-10-CM | POA: Diagnosis not present

## 2019-12-22 DIAGNOSIS — H47323 Drusen of optic disc, bilateral: Secondary | ICD-10-CM | POA: Diagnosis not present

## 2019-12-22 DIAGNOSIS — H00025 Hordeolum internum left lower eyelid: Secondary | ICD-10-CM | POA: Diagnosis not present

## 2019-12-22 DIAGNOSIS — H0102B Squamous blepharitis left eye, upper and lower eyelids: Secondary | ICD-10-CM | POA: Diagnosis not present

## 2019-12-22 DIAGNOSIS — H40033 Anatomical narrow angle, bilateral: Secondary | ICD-10-CM | POA: Diagnosis not present

## 2019-12-22 DIAGNOSIS — H40023 Open angle with borderline findings, high risk, bilateral: Secondary | ICD-10-CM | POA: Diagnosis not present

## 2019-12-23 ENCOUNTER — Other Ambulatory Visit: Payer: Self-pay

## 2019-12-23 ENCOUNTER — Ambulatory Visit: Payer: Self-pay

## 2019-12-23 DIAGNOSIS — J4550 Severe persistent asthma, uncomplicated: Secondary | ICD-10-CM

## 2020-01-05 ENCOUNTER — Other Ambulatory Visit: Payer: Self-pay | Admitting: Family Medicine

## 2020-01-05 DIAGNOSIS — Z1231 Encounter for screening mammogram for malignant neoplasm of breast: Secondary | ICD-10-CM

## 2020-01-08 DIAGNOSIS — J45909 Unspecified asthma, uncomplicated: Secondary | ICD-10-CM | POA: Diagnosis not present

## 2020-01-08 DIAGNOSIS — H00025 Hordeolum internum left lower eyelid: Secondary | ICD-10-CM | POA: Diagnosis not present

## 2020-01-09 DIAGNOSIS — Z9889 Other specified postprocedural states: Secondary | ICD-10-CM | POA: Diagnosis not present

## 2020-01-09 DIAGNOSIS — Z8719 Personal history of other diseases of the digestive system: Secondary | ICD-10-CM | POA: Diagnosis not present

## 2020-01-20 ENCOUNTER — Other Ambulatory Visit: Payer: Self-pay

## 2020-01-20 ENCOUNTER — Ambulatory Visit: Payer: Self-pay

## 2020-01-20 DIAGNOSIS — J4550 Severe persistent asthma, uncomplicated: Secondary | ICD-10-CM

## 2020-01-28 ENCOUNTER — Other Ambulatory Visit: Payer: Self-pay

## 2020-01-28 ENCOUNTER — Encounter: Payer: Self-pay | Admitting: Family Medicine

## 2020-01-28 ENCOUNTER — Ambulatory Visit (INDEPENDENT_AMBULATORY_CARE_PROVIDER_SITE_OTHER): Payer: Medicaid Other | Admitting: Family Medicine

## 2020-01-28 VITALS — BP 124/72 | HR 69 | Ht 64.0 in | Wt 256.2 lb

## 2020-01-28 DIAGNOSIS — I5030 Unspecified diastolic (congestive) heart failure: Secondary | ICD-10-CM

## 2020-01-28 DIAGNOSIS — G4730 Sleep apnea, unspecified: Secondary | ICD-10-CM

## 2020-01-28 DIAGNOSIS — J4550 Severe persistent asthma, uncomplicated: Secondary | ICD-10-CM

## 2020-01-28 DIAGNOSIS — F3342 Major depressive disorder, recurrent, in full remission: Secondary | ICD-10-CM

## 2020-01-28 DIAGNOSIS — E1140 Type 2 diabetes mellitus with diabetic neuropathy, unspecified: Secondary | ICD-10-CM | POA: Diagnosis not present

## 2020-01-28 DIAGNOSIS — E7800 Pure hypercholesterolemia, unspecified: Secondary | ICD-10-CM

## 2020-01-28 LAB — POCT GLYCOSYLATED HEMOGLOBIN (HGB A1C): HbA1c, POC (controlled diabetic range): 5.4 % (ref 0.0–7.0)

## 2020-01-28 NOTE — Patient Instructions (Addendum)
I am wonderfully excited about what you have done with yourself.   You have cured yourself of high blood pressure and diabetes.   Please ask your gyn doctor to send me a copy of your last pap smear.  I need it for my records.   I am glad you got your COVID vaccines.   I will call with blood test results.

## 2020-01-29 ENCOUNTER — Encounter: Payer: Self-pay | Admitting: Family Medicine

## 2020-01-29 LAB — BASIC METABOLIC PANEL
BUN/Creatinine Ratio: 20 (ref 9–23)
BUN: 14 mg/dL (ref 6–24)
CO2: 24 mmol/L (ref 20–29)
Calcium: 9.1 mg/dL (ref 8.7–10.2)
Chloride: 103 mmol/L (ref 96–106)
Creatinine, Ser: 0.7 mg/dL (ref 0.57–1.00)
GFR calc Af Amer: 111 mL/min/{1.73_m2} (ref >59)
GFR calc non Af Amer: 96 mL/min/{1.73_m2} (ref >59)
Glucose: 91 mg/dL (ref 65–99)
Potassium: 4.5 mmol/L (ref 3.5–5.2)
Sodium: 140 mmol/L (ref 134–144)

## 2020-01-29 LAB — LIPID PANEL
Chol/HDL Ratio: 3.1 ratio (ref 0.0–4.4)
Cholesterol, Total: 108 mg/dL (ref 100–199)
HDL: 35 mg/dL — ABNORMAL LOW (ref >39)
LDL Chol Calc (NIH): 53 mg/dL (ref 0–99)
Triglycerides: 106 mg/dL (ref 0–149)
VLDL Cholesterol Cal: 20 mg/dL (ref 5–40)

## 2020-01-29 MED ORDER — ATORVASTATIN CALCIUM 40 MG PO TABS: 20.0000 mg | ORAL_TABLET | Freq: Every day | ORAL | 3 refills | Status: DC

## 2020-01-29 NOTE — Progress Notes (Signed)
    SUBJECTIVE:   CHIEF COMPLAINT / HPI:   Kelsey Bullock is for follow up of chronic medical problems. Morbid obesity.  She continues with great wt loss. She has gone from her max of 490 lbs to 256 today.  She is justifiably proud of herself. Asthma: Contorled on current meds.  Denies wheezing.  She is aware that wt loss dose not improve this problem HBP: history of.  She is now off all meds with normal BP DM history of: She is now off all diabetic meds and A!C is in non diabetic range HPDP she is fully COVID vaccinated. Depression: not a problem.  She feels and looks the best she has in the 20 years I have known her.  She feels like she has regained control of her life. High cholesterol.  Is on statin, no longer DM  No sx    OBJECTIVE:   BP 124/72   Pulse 69   Ht 5\' 4"  (1.626 m)   Wt 256 lb 3.2 oz (116.2 kg)   LMP 02/03/2019   SpO2 96%   BMI 43.98 kg/m   Lungs clear, no wheeze. Cardiac RRR without m or g Abd benign Affect bright and cheery  ASSESSMENT/PLAN:   No problem-specific Assessment & Plan notes found for this encounter.     [name], MD [place]

## 2020-01-29 NOTE — Assessment & Plan Note (Signed)
Given very good LDL and no longer DM, will decrease atorvastatin and recheck direct LDL in 3 months.

## 2020-01-29 NOTE — Assessment & Plan Note (Signed)
Markedly improved with wt loss and healthy diet

## 2020-01-29 NOTE — Assessment & Plan Note (Signed)
Improving with diet and exercise.  Keep up the good work.

## 2020-01-29 NOTE — Assessment & Plan Note (Signed)
Markedly improved with weight loss and the sense that she has regained control of her life.

## 2020-01-29 NOTE — Assessment & Plan Note (Signed)
Stable on current meds 

## 2020-01-30 ENCOUNTER — Ambulatory Visit
Admission: RE | Admit: 2020-01-30 | Discharge: 2020-01-30 | Disposition: A | Discharge status: Home / Self Care | Payer: Medicaid Other | Source: Ambulatory Visit | Attending: Family Medicine | Admitting: Family Medicine

## 2020-01-30 ENCOUNTER — Other Ambulatory Visit: Payer: Self-pay

## 2020-01-30 DIAGNOSIS — Z1231 Encounter for screening mammogram for malignant neoplasm of breast: Secondary | ICD-10-CM | POA: Diagnosis not present

## 2020-02-04 DIAGNOSIS — Z0000 Encounter for general adult medical examination without abnormal findings: Secondary | ICD-10-CM | POA: Diagnosis not present

## 2020-02-04 DIAGNOSIS — Z01419 Encounter for gynecological examination (general) (routine) without abnormal findings: Secondary | ICD-10-CM | POA: Diagnosis not present

## 2020-02-04 LAB — HM PAP SMEAR

## 2020-02-04 LAB — RESULTS CONSOLE HPV: CHL HPV: NEGATIVE

## 2020-02-09 ENCOUNTER — Ambulatory Visit (HOSPITAL_COMMUNITY)
Admission: EM | Admit: 2020-02-09 | Discharge: 2020-02-09 | Disposition: A | Discharge status: Home / Self Care | Payer: Medicaid Other | Attending: Family Medicine | Admitting: Family Medicine

## 2020-02-09 ENCOUNTER — Encounter (HOSPITAL_COMMUNITY): Payer: Self-pay | Admitting: Emergency Medicine

## 2020-02-09 ENCOUNTER — Other Ambulatory Visit: Payer: Self-pay

## 2020-02-09 DIAGNOSIS — L0291 Cutaneous abscess, unspecified: Secondary | ICD-10-CM | POA: Diagnosis not present

## 2020-02-09 MED ORDER — DOXYCYCLINE HYCLATE 100 MG PO CAPS: 100.0000 mg | ORAL_CAPSULE | Freq: Two times a day (BID) | ORAL | 0 refills | Status: DC

## 2020-02-09 NOTE — Discharge Instructions (Addendum)
Warm compresses to the area.  Doxycycline for antibiotic coverage.  Tylenol for pain as needed Follow up as needed for continued or worsening symptoms

## 2020-02-09 NOTE — ED Triage Notes (Signed)
Patient presents to [facility] for assessment of abscess to left breast starting after she had a mammogram 1.5 weeks ago.  Patient denies fevers, denies n/v.

## 2020-02-11 ENCOUNTER — Ambulatory Visit (HOSPITAL_COMMUNITY)
Admission: EM | Admit: 2020-02-11 | Discharge: 2020-02-11 | Disposition: A | Discharge status: Home / Self Care | Payer: Medicaid Other | Attending: Family Medicine | Admitting: Family Medicine

## 2020-02-11 ENCOUNTER — Other Ambulatory Visit: Payer: Self-pay

## 2020-02-11 ENCOUNTER — Encounter (HOSPITAL_COMMUNITY): Payer: Self-pay | Admitting: Emergency Medicine

## 2020-02-11 DIAGNOSIS — N611 Abscess of the breast and nipple: Secondary | ICD-10-CM | POA: Diagnosis not present

## 2020-02-11 MED ORDER — TRAMADOL HCL 50 MG PO TABS: 50.0000 mg | ORAL_TABLET | Freq: Three times a day (TID) | ORAL | 0 refills | Status: DC | PRN

## 2020-02-11 NOTE — ED Triage Notes (Signed)
Pt presents with abscess on left breast. Was seen on Monday for same reason. States the area is feverish, red, and very painful. Unable to move without pain. States she has taken 2 doses of antibiotic.

## 2020-02-11 NOTE — ED Provider Notes (Signed)
[place]    CSN: [id number] Arrival date & time: 02/11/20  5427      History   Chief Complaint Chief Complaint  Patient presents with  . Abscess    HPI Kelsey Bullock is a 59 y.o. female.   Here today for follow up from visit 2 days ago for worsening right breast abscess. Was given doxycycline at this visit but has only been able to take 2 doses, one last night and one this morning. Area is quite swollen, hot to the touch, and red. No active drainage yet per patient. Denies fever, chills, sweats, body aches. Not putting anything topically on area.      Past Medical History:  Diagnosis Date  . Allergic rhinitis   . Anemia   . Anxiety   . Arthritis    knees and back  . Asthma   . Complication of anesthesia   . Congestive heart failure (CHF) ([place])   . Depression   . Gall stone   . GERD (gastroesophageal reflux disease)   . Headache    migraines, stress headaches  . History of kidney stones   . Hypertension   . Hyperthyroidism    during pregnancy  . Pneumonia   . PONV (postoperative nausea and vomiting)   . Sleep apnea    can't use Cpap    Patient Active Problem List   Diagnosis Date Noted  . Constipation 03/22/2019  . Hx of adenomatous colonic polyps   . Pilonidal cyst 02/22/2018  . Diastolic CHF with preserved left ventricular function, NYHA class 2 ([place]) 12/14/2016  . GERD (gastroesophageal reflux disease) 02/06/2015  . Severe persistent asthma 02/06/2015  . Depression, major, recurrent ([place]) 02/19/2014  . Sleep apnea 03/10/2009  . Seborrheic eczema 07/17/2007  . LOW BACK PAIN SYNDROME 11/26/2006  . HYPERCHOLESTEROLEMIA 08/02/2006  . Morbid obesity ([place]) 08/02/2006  . Anxiety state 08/02/2006  . PSEUDOTUMOR CEREBRI 08/02/2006  . OSTEOARTHRITIS, LOWER LEG 08/02/2006  . FIBROMYALGIA, FIBROMYOSITIS 08/02/2006    Past Surgical History:  Procedure Laterality Date  . ADENOIDECTOMY    . APPLICATION OF WOUND VAC  03/18/2019  .  APPLICATION OF WOUND VAC N/A 03/18/2019   Procedure: Application Of Wound Vac;  Surgeon: Donnie Mesa, MD;  Location: [facility];  Service: General;  Laterality: N/A;  . BIOPSY  05/27/2018   Procedure: BIOPSY;  Surgeon: Mauri Pole, MD;  Location[facility];  Service: Endoscopy;;  . CESAREAN SECTION    . COLONOSCOPY WITH PROPOFOL N/A 05/27/2018   Procedure: COLONOSCOPY WITH PROPOFOL;  Surgeon: Mauri Pole, MD;  [facility];  Service: Endoscopy;  Laterality: N/A;  . CYST EXCISION    . EXPLORATORY LAPAROTOMY  02/03/2019  . INGUINAL HERNIA REPAIR N/A 02/03/2019   Procedure: EXPLORATORY LAPAROTOMY WITH SMALL BOWEL RESECTION AND PRIMARY CLOSURE OF VENTRAL HERNIA;  Surgeon: Donnie Mesa, MD;  Location: [facility];  Service: General;  Laterality: N/A;  . OVARIAN CYST REMOVAL    . POLYPECTOMY  05/27/2018   Procedure: POLYPECTOMY;  Surgeon: Mauri Pole, MD;  [facility];  Service: Endoscopy;;  . removal of gallstones    . TONSILLECTOMY    . TYMPANOSTOMY TUBE PLACEMENT    . WOUND EXPLORATION  03/18/2019   abdominal  . WOUND EXPLORATION N/A 03/18/2019   Procedure: ABDOMINAL WOUND EXPLORATION;  Surgeon: Donnie Mesa, MD;  Location: [place];  Service: General;  Laterality: N/A;    OB History   No obstetric history on file.  Home Medications    Prior to Admission medications   Medication Sig Start Date End Date Taking? Authorizing Provider  acetaminophen (TYLENOL) 500 MG tablet Take 1,000 mg by mouth 2 (two) times daily as needed for moderate pain or headache.    [provider]  albuterol (PROVENTIL) (2.5 MG/3ML) 0.083% nebulizer solution Take 3 mLs (2.5 mg total) by nebulization every 4 (four) hours as needed. USE 1 VIAL VIA NEBULIZER EVERY 4 TO 6 HOURS AS NEEDED FOR COUGH OR WHEEZING 05/20/19   Kozlow, Donnamarie Poag, MD  albuterol (VENTOLIN HFA) 108 (90 Base) MCG/ACT inhaler Inhale 2 puffs into the lungs every 4 (four) hours as needed. 05/20/19    Kozlow, Donnamarie Poag, MD  Ascorbic Acid (VITAMIN C) 1000 MG tablet Take 1,000 mg by mouth daily.    [provider]  atorvastatin (LIPITOR) 40 MG tablet Take 0.5 tablets (20 mg total) by mouth daily. 01/29/20   [name], MD  calcium carbonate (TUMS - DOSED IN MG ELEMENTAL CALCIUM) 500 MG chewable tablet Chew 2-3 tablets by mouth daily as needed for indigestion or heartburn.    [provider]  cetirizine (ZYRTEC) 10 MG tablet Take 1 tablet (10 mg total) by mouth daily. 05/20/19   Kozlow, Donnamarie Poag, MD  CINNAMON PO Take 1 tablet by mouth daily as needed (for high bp).    [provider]  Cobalamin Combinations (B12 FOLATE PO) Take by mouth.    [provider]  COLLAGEN PO Take by mouth.    [provider]  Cranberry 1000 MG CAPS Take 1,000 mg by mouth daily.    [provider]  cyclobenzaprine (FLEXERIL) 10 MG tablet TAKE 1 TABLET BY MOUTH AT BEDTIME AS NEEDED FOR BACK OR NECK PAIN OR SPASMS 06/20/19   Hensel, [name], MD  doxycycline (VIBRAMYCIN) 100 MG capsule Take 1 capsule (100 mg total) by mouth 2 (two) times daily. 02/09/20   Bast, Tressia Miners A, NP  EPINEPHrine (EPIPEN 2-PAK) 0.3 mg/0.3 mL IJ SOAJ injection Inject 0.3 mLs (0.3 mg total) into the muscle once for 1 dose. 05/20/19 06/24/27  Kozlow, Donnamarie Poag, MD  famotidine (PEPCID) 20 MG tablet Take 1 tablet (20 mg total) by mouth 2 (two) times daily. 05/20/19   Kozlow, Donnamarie Poag, MD  fluticasone (FLONASE) 50 MCG/ACT nasal spray SHAKE LIQUID AND USE 2 SPRAYS IN EACH NOSTRIL DAILY 09/29/19   Kozlow, Donnamarie Poag, MD  ipratropium (ATROVENT) 0.06 % nasal spray Use 2 sprays in each nostril every 6 hours if needed to dry up nose 09/29/19   Kozlow, Donnamarie Poag, MD  mometasone-formoterol (DULERA) 200-5 MCG/ACT AERO Inhale 2 puffs into the lungs 2 (two) times daily. 09/29/19   Kozlow, Donnamarie Poag, MD  montelukast (SINGULAIR) 10 MG tablet Take 1 tablet (10 mg total) by mouth at bedtime. 09/29/19   Kozlow, Donnamarie Poag, MD  Multiple  Vitamins-Minerals (ZINC) LOZG Take 1 lozenge by mouth daily.    [provider]  norethindrone (AYGESTIN) 5 MG tablet Take 2.5 mg by mouth at bedtime. 02/11/19   [provider]  NYSTATIN powder APPLY TOPICALLY TO THE AFFECTED AREA THREE TIMES DAILY 04/14/19   Hensel, [name], MD  Olopatadine HCl (PAZEO) 0.7 % SOLN Place 1 drop into both eyes at bedtime.     [provider]  polyethylene glycol powder (GLYCOLAX/MIRALAX) 17 GM/SCOOP powder Take 17 g by mouth 2 (two) times daily as needed. 09/01/19   [name], MD  traMADol (ULTRAM) 50 MG tablet  Take 1 tablet (50 mg total) by mouth 3 (three) times daily as needed. 02/11/20   Volney American, PA-C  triamcinolone (KENALOG) 0.025 % ointment Apply topically 2 (two) times daily. 05/20/19   Kozlow, Donnamarie Poag, MD  XOLAIR 150 MG injection INJECT 300 MG UNDER THE SKIN EVERY 28 DAYS 05/15/19   Kozlow, Donnamarie Poag, MD  Zinc Acetate, Oral, (ZINC ACETATE PO) Take by mouth.    [provider]    Family History Family History  Problem Relation Age of Onset  . Diabetes Mother   . Diabetes Paternal Uncle   . Allergic rhinitis Neg Hx   . Angioedema Neg Hx   . Asthma Neg Hx   . Atopy Neg Hx   . Eczema Neg Hx   . Immunodeficiency Neg Hx   . Urticaria Neg Hx     Social History Social History   Tobacco Use  . Smoking status: Never Smoker  . Smokeless tobacco: Never Used  Vaping Use  . Vaping Use: Never used  Substance Use Topics  . Alcohol use: No  . Drug use: No     Allergies   Amoxicillin, Etodolac, Penicillins, Ampicillin, Augmentin [amoxicillin-pot clavulanate], Diclofenac sodium, Pantoprazole sodium, and Adhesive [tape]   Review of Systems Review of Systems PER HPI   Physical Exam Triage Vital Signs ED Triage Vitals  Enc Vitals Group     BP 02/11/20 1049 108/70     Pulse Rate 02/11/20 1049 74     Resp 02/11/20 1049 20     Temp 02/11/20 1049 99.1 F (37.3 C)     Temp Source 02/11/20 1049  Oral     SpO2 02/11/20 1049 98 %     Weight --      Height --      Head Circumference --      Peak Flow --      Pain Score 02/11/20 1048 9     Pain Loc --      Pain Edu? --      Excl. in [place]? --    No data found.  Updated Vital Signs BP 108/70 (BP Location: Left Wrist)   Pulse 74   Temp 99.1 F (37.3 C) (Oral)   Resp 20   LMP 02/03/2019 Comment: Tubal Ligation  SpO2 98%   Visual Acuity Right Eye Distance:   Left Eye Distance:   Bilateral Distance:    Right Eye Near:   Left Eye Near:    Bilateral Near:     Physical Exam Vitals and nursing note reviewed.  Constitutional:      Appearance: Normal appearance. She is not ill-appearing.  HENT:     Head: Atraumatic.  Eyes:     Extraocular Movements: Extraocular movements intact.     Conjunctiva/sclera: Conjunctivae normal.  Cardiovascular:     Rate and Rhythm: Normal rate and regular rhythm.     Heart sounds: Normal heart sounds.  Pulmonary:     Effort: Pulmonary effort is normal.     Breath sounds: Normal breath sounds.  Chest:    Abdominal:     General: Bowel sounds are normal. There is no distension.     Palpations: Abdomen is soft.     Tenderness: There is no abdominal tenderness. There is no guarding.  Musculoskeletal:        General: Normal range of motion.     Cervical back: Normal range of motion and neck supple.  Lymphadenopathy:     Upper Body:  Right upper body: No axillary adenopathy.     Left upper body: No axillary adenopathy.  Skin:    General: Skin is warm and dry.  Neurological:     Mental Status: She is alert and oriented to person, place, and time.  Psychiatric:        Mood and Affect: Mood normal.        Thought Content: Thought content normal.        Judgment: Judgment normal.     UC Treatments / Results  Labs (all labs ordered are listed, but only abnormal results are displayed) Labs Reviewed - No data to display  EKG   Radiology No results  found.  Procedures Procedures (including critical care time)  Medications Ordered in UC Medications - No data to display  Initial Impression / Assessment and Plan / UC Course  I have reviewed the triage vital signs and the nursing notes.  Pertinent labs & imaging results that were available during my care of the patient were reviewed by me and considered in my medical decision making (see chart for details).     Abscess began draining spontaneously during palpation on exam, so gauze pads used to express multiple locules of thin purulent drainage without issue. Continue the doxycycline as prescribed, use warm compresses and neosporin topically. Small supply of tramadol sent for severe pain, can use OTC pain relievers as mainstay pain relief. F/u with primary care for recheck in 5 days or here if worsening at any point.   Final Clinical Impressions(s) / UC Diagnoses   Final diagnoses:  Breast abscess   Discharge Instructions   None    ED Prescriptions    Medication Sig Dispense Auth. Provider   traMADol (ULTRAM) 50 MG tablet Take 1 tablet (50 mg total) by mouth 3 (three) times daily as needed. 6 tablet Volney American, [place]     I have reviewed the PDMP during this encounter.   Volney American, [place] 02/11/20 1202

## 2020-02-11 NOTE — ED Provider Notes (Signed)
[place]    CSN: [id number] Arrival date & time: 02/09/20  1102      History   Chief Complaint Chief Complaint  Patient presents with   Abscess    HPI Kelsey Bullock is a 59 y.o. female.   Patient is a 59 year old female presents today with abscess to left breast.  This started approximately a week and a half ago.  Denies any drainage from the area.  Denies any fever, chills.  Has not done anything to treat this abscess.     Past Medical History:  Diagnosis Date   Allergic rhinitis    Anemia    Anxiety    Arthritis    knees and back   Asthma    Complication of anesthesia    Congestive heart failure (CHF) ([place])    Depression    Gall stone    GERD (gastroesophageal reflux disease)    Headache    migraines, stress headaches   History of kidney stones    Hypertension    Hyperthyroidism    during pregnancy   Pneumonia    PONV (postoperative nausea and vomiting)    Sleep apnea    can't use Cpap    Patient Active Problem List   Diagnosis Date Noted   Constipation 03/22/2019   Hx of adenomatous colonic polyps    Pilonidal cyst 70/06/7492   Diastolic CHF with preserved left ventricular function, NYHA class 2 ([place]) 12/14/2016   GERD (gastroesophageal reflux disease) 02/06/2015   Severe persistent asthma 02/06/2015   Depression, major, recurrent ([place]) 02/19/2014   Sleep apnea 03/10/2009   Seborrheic eczema 07/17/2007   LOW BACK PAIN SYNDROME 11/26/2006   HYPERCHOLESTEROLEMIA 08/02/2006   Morbid obesity ([place]) 08/02/2006   Anxiety state 08/02/2006   PSEUDOTUMOR CEREBRI 08/02/2006   OSTEOARTHRITIS, LOWER LEG 08/02/2006   FIBROMYALGIA, FIBROMYOSITIS 08/02/2006    Past Surgical History:  Procedure Laterality Date   ADENOIDECTOMY     APPLICATION OF WOUND VAC  49/67/5916   APPLICATION OF WOUND VAC N/A 03/18/2019   Procedure: Application Of Wound Vac;  Surgeon: Donnie Mesa, MD;  Location: [facility];  Service:  General;  Laterality: N/A;   BIOPSY  05/27/2018   Procedure: BIOPSY;  Surgeon: Mauri Pole, MD;  [facility];  Service: Endoscopy;;   CESAREAN SECTION     COLONOSCOPY WITH PROPOFOL N/A 05/27/2018   Procedure: COLONOSCOPY WITH PROPOFOL;  Surgeon: Mauri Pole, MD;  [facility];  Service: Endoscopy;  Laterality: N/A;   CYST EXCISION     EXPLORATORY LAPAROTOMY  02/03/2019   INGUINAL HERNIA REPAIR N/A 02/03/2019   Procedure: EXPLORATORY LAPAROTOMY WITH SMALL BOWEL RESECTION AND PRIMARY CLOSURE OF VENTRAL HERNIA;  Surgeon: Donnie Mesa, MD;  Location: [facility];  Service: General;  Laterality: N/A;   OVARIAN CYST REMOVAL     POLYPECTOMY  05/27/2018   Procedure: POLYPECTOMY;  Surgeon: Mauri Pole, MD;  [facility];  Service: Endoscopy;;   removal of gallstones     TONSILLECTOMY     TYMPANOSTOMY TUBE PLACEMENT     WOUND EXPLORATION  03/18/2019   abdominal   WOUND EXPLORATION N/A 03/18/2019   Procedure: ABDOMINAL WOUND EXPLORATION;  Surgeon: Donnie Mesa, MD;  Location: [facility];  Service: General;  Laterality: N/A;    OB History   No obstetric history on file.      Home Medications    Prior to Admission medications   Medication Sig Start Date End Date Taking? Authorizing Provider  acetaminophen (TYLENOL) 500 MG tablet Take 1,000 mg by mouth 2 (two) times daily as needed for moderate pain or headache.    [provider]  albuterol (PROVENTIL) (2.5 MG/3ML) 0.083% nebulizer solution Take 3 mLs (2.5 mg total) by nebulization every 4 (four) hours as needed. USE 1 VIAL VIA NEBULIZER EVERY 4 TO 6 HOURS AS NEEDED FOR COUGH OR WHEEZING 05/20/19   Kozlow, Donnamarie Poag, MD  albuterol (VENTOLIN HFA) 108 (90 Base) MCG/ACT inhaler Inhale 2 puffs into the lungs every 4 (four) hours as needed. 05/20/19   Kozlow, Donnamarie Poag, MD  Ascorbic Acid (VITAMIN C) 1000 MG tablet Take 1,000 mg by mouth daily.    [provider]  atorvastatin  (LIPITOR) 40 MG tablet Take 0.5 tablets (20 mg total) by mouth daily. 01/29/20   [name], MD  calcium carbonate (TUMS - DOSED IN MG ELEMENTAL CALCIUM) 500 MG chewable tablet Chew 2-3 tablets by mouth daily as needed for indigestion or heartburn.    [provider]  cetirizine (ZYRTEC) 10 MG tablet Take 1 tablet (10 mg total) by mouth daily. 05/20/19   Kozlow, Donnamarie Poag, MD  CINNAMON PO Take 1 tablet by mouth daily as needed (for high bp).    [provider]  Cobalamin Combinations (B12 FOLATE PO) Take by mouth.    [provider]  COLLAGEN PO Take by mouth.    [provider]  Cranberry 1000 MG CAPS Take 1,000 mg by mouth daily.    [provider]  cyclobenzaprine (FLEXERIL) 10 MG tablet TAKE 1 TABLET BY MOUTH AT BEDTIME AS NEEDED FOR BACK OR NECK PAIN OR SPASMS 06/20/19   Hensel, [name], MD  doxycycline (VIBRAMYCIN) 100 MG capsule Take 1 capsule (100 mg total) by mouth 2 (two) times daily. 02/09/20   Kanai Berrios, Tressia Miners A, NP  EPINEPHrine (EPIPEN 2-PAK) 0.3 mg/0.3 mL IJ SOAJ injection Inject 0.3 mLs (0.3 mg total) into the muscle once for 1 dose. 05/20/19 06/24/27  Kozlow, Donnamarie Poag, MD  famotidine (PEPCID) 20 MG tablet Take 1 tablet (20 mg total) by mouth 2 (two) times daily. 05/20/19   Kozlow, Donnamarie Poag, MD  fluticasone (FLONASE) 50 MCG/ACT nasal spray SHAKE LIQUID AND USE 2 SPRAYS IN EACH NOSTRIL DAILY 09/29/19   Kozlow, Donnamarie Poag, MD  ipratropium (ATROVENT) 0.06 % nasal spray Use 2 sprays in each nostril every 6 hours if needed to dry up nose 09/29/19   Kozlow, Donnamarie Poag, MD  mometasone-formoterol (DULERA) 200-5 MCG/ACT AERO Inhale 2 puffs into the lungs 2 (two) times daily. 09/29/19   Kozlow, Donnamarie Poag, MD  montelukast (SINGULAIR) 10 MG tablet Take 1 tablet (10 mg total) by mouth at bedtime. 09/29/19   Kozlow, Donnamarie Poag, MD  Multiple Vitamins-Minerals (ZINC) LOZG Take 1 lozenge by mouth daily.    [provider]  norethindrone (AYGESTIN) 5 MG tablet Take 2.5 mg by  mouth at bedtime. 02/11/19   [provider]  NYSTATIN powder APPLY TOPICALLY TO THE AFFECTED AREA THREE TIMES DAILY 04/14/19   Hensel, [name], MD  Olopatadine HCl (PAZEO) 0.7 % SOLN Place 1 drop into both eyes at bedtime.     [provider]  polyethylene glycol powder (GLYCOLAX/MIRALAX) 17 GM/SCOOP powder Take 17 g by mouth 2 (two) times daily as needed. 09/01/19   [name], MD  traMADol (ULTRAM) 50 MG tablet Take 1 tablet (50 mg total) by mouth 3 (three) times daily as needed. 02/11/20   Volney American, PA-C  triamcinolone (KENALOG) 0.025 % ointment Apply topically 2 (two) times daily. 05/20/19   Kozlow, Donnamarie Poag, MD  XOLAIR 150 MG injection INJECT 300 MG UNDER THE SKIN EVERY 28 DAYS 05/15/19   Kozlow, Donnamarie Poag, MD  Zinc Acetate, Oral, (ZINC ACETATE PO) Take by mouth.    [provider]    Family History Family History  Problem Relation Age of Onset   Diabetes Mother    Diabetes Paternal Uncle    Allergic rhinitis Neg Hx    Angioedema Neg Hx    Asthma Neg Hx    Atopy Neg Hx    Eczema Neg Hx    Immunodeficiency Neg Hx    Urticaria Neg Hx     Social History Social History   Tobacco Use   Smoking status: Never Smoker   Smokeless tobacco: Never Used  Scientific laboratory technician Use: Never used  Substance Use Topics   Alcohol use: No   Drug use: No     Allergies   Amoxicillin, Etodolac, Penicillins, Ampicillin, Augmentin [amoxicillin-pot clavulanate], Diclofenac sodium, Pantoprazole sodium, and Adhesive [tape]   Review of Systems Review of Systems   Physical Exam Triage Vital Signs ED Triage Vitals  Enc Vitals Group     BP 02/09/20 1233 (!) 155/84     Pulse Rate 02/09/20 1233 70     Resp 02/09/20 1233 18     Temp 02/09/20 1233 98.8 F (37.1 C)     Temp Source 02/09/20 1233 Oral     SpO2 02/09/20 1233 99 %     Weight --      Height --      Head Circumference --      Peak Flow --      Pain Score 02/09/20 1234 8      Pain Loc --      Pain Edu? --      Excl. in [place]? --    No data found.  Updated Vital Signs BP (!) 155/84 (BP Location: Right Wrist)    Pulse 70    Temp 98.8 F (37.1 C) (Oral)    Resp 18    LMP 02/03/2019 Comment: Tubal Ligation   SpO2 99%   Visual Acuity Right Eye Distance:   Left Eye Distance:   Bilateral Distance:    Right Eye Near:   Left Eye Near:    Bilateral Near:     Physical Exam Vitals and nursing note reviewed.  Constitutional:      General: She is not in acute distress.    Appearance: Normal appearance. She is not ill-appearing, toxic-appearing or diaphoretic.  HENT:     Head: Normocephalic.     Nose: Nose normal.  Eyes:     Conjunctiva/sclera: Conjunctivae normal.  Pulmonary:     Effort: Pulmonary effort is normal.  Chest:     Breasts:        Left: Swelling, skin change and tenderness present. No bleeding, inverted nipple, mass or nipple discharge.     Comments: Approximated 1-1/2 2 similar abscess to left underneath of breast area with surrounding erythema.  Small central fluctuance No drainage Musculoskeletal:        General: Normal range of motion.     Cervical back: Normal range of motion.  Skin:    General: Skin is warm and dry.     Findings: No rash.  Neurological:     Mental Status: She is alert.  Psychiatric:        Mood and  Affect: Mood normal.      UC Treatments / Results  Labs (all labs ordered are listed, but only abnormal results are displayed) Labs Reviewed - No data to display  EKG   Radiology No results found.  Procedures Procedures (including critical care time)  Medications Ordered in UC Medications - No data to display  Initial Impression / Assessment and Plan / UC Course  I have reviewed the triage vital signs and the nursing notes.  Pertinent labs & imaging results that were available during my care of the patient were reviewed by me and considered in my medical decision making (see chart for details).      Abscess Warm compresses Doxycycline to treat infection. Tylenol for pain as needed.  Follow up as needed for continued or worsening symptoms  Final Clinical Impressions(s) / UC Diagnoses   Final diagnoses:  Abscess     Discharge Instructions     Warm compresses to the area.  Doxycycline for antibiotic coverage.  Tylenol for pain as needed Follow up as needed for continued or worsening symptoms     ED Prescriptions    Medication Sig Dispense Auth. Provider   doxycycline (VIBRAMYCIN) 100 MG capsule Take 1 capsule (100 mg total) by mouth 2 (two) times daily. 20 capsule Loura Halt A, NP     PDMP not reviewed this encounter.   Loura Halt A, NP 02/11/20 1144

## 2020-02-13 ENCOUNTER — Ambulatory Visit: Payer: Medicaid Other

## 2020-02-17 ENCOUNTER — Other Ambulatory Visit: Payer: Self-pay

## 2020-02-17 ENCOUNTER — Ambulatory Visit (INDEPENDENT_AMBULATORY_CARE_PROVIDER_SITE_OTHER): Payer: Medicaid Other | Admitting: *Deleted

## 2020-02-17 DIAGNOSIS — J4550 Severe persistent asthma, uncomplicated: Secondary | ICD-10-CM

## 2020-02-24 ENCOUNTER — Other Ambulatory Visit: Payer: Self-pay

## 2020-02-24 ENCOUNTER — Ambulatory Visit (INDEPENDENT_AMBULATORY_CARE_PROVIDER_SITE_OTHER): Payer: Medicaid Other

## 2020-02-24 DIAGNOSIS — Z23 Encounter for immunization: Secondary | ICD-10-CM

## 2020-02-24 NOTE — Progress Notes (Signed)
Patient presents in nurse clinic for Flu Vaccine.   Vaccine administered LD without complication.   See admin for details.

## 2020-02-25 ENCOUNTER — Encounter: Payer: Self-pay | Admitting: Family Medicine

## 2020-02-25 DIAGNOSIS — Z124 Encounter for screening for malignant neoplasm of cervix: Secondary | ICD-10-CM | POA: Insufficient documentation

## 2020-03-16 ENCOUNTER — Ambulatory Visit: Payer: Self-pay

## 2020-03-16 ENCOUNTER — Ambulatory Visit: Payer: Medicaid Other | Admitting: Allergy and Immunology

## 2020-03-16 ENCOUNTER — Encounter: Payer: Self-pay | Admitting: Allergy and Immunology

## 2020-03-16 ENCOUNTER — Other Ambulatory Visit: Payer: Self-pay

## 2020-03-16 VITALS — BP 128/68 | HR 64 | Resp 16

## 2020-03-16 DIAGNOSIS — K219 Gastro-esophageal reflux disease without esophagitis: Secondary | ICD-10-CM | POA: Diagnosis not present

## 2020-03-16 DIAGNOSIS — J4550 Severe persistent asthma, uncomplicated: Secondary | ICD-10-CM | POA: Diagnosis not present

## 2020-03-16 DIAGNOSIS — J3089 Other allergic rhinitis: Secondary | ICD-10-CM | POA: Diagnosis not present

## 2020-03-16 DIAGNOSIS — J4540 Moderate persistent asthma, uncomplicated: Secondary | ICD-10-CM | POA: Diagnosis not present

## 2020-03-16 NOTE — Progress Notes (Signed)
[facility]   Follow-up Note  Referring Provider: [name], MD Primary Provider: [name], MD Date of Office Visit: 03/16/2020  Subjective:   Kelsey Bullock (DOB: 09-12-1960) is a 59 y.o. female who returns to the [facility] on 03/16/2020 in re-evaluation of the following:  HPI: Kelsey Bullock returns to this clinic in evaluation of asthma and allergic rhinitis and a history of suspected obesity hypoventilation syndrome and reflux.  Her last visit to this clinic was 16 September 2019.  She continues to do excellent with her asthma not requiring a systemic steroid for an exacerbation and no need to activate her action plan and rare use of a short acting bronchodilator and good ability to exert herself without any problem while she continues on Dulera twice a day and omalizumab injections.  Likewise, her nose has really been doing quite well while using montelukast daily and occasionally some nasal steroid.  She also occasionally uses nasal ipratropium.  She has had very good control of her reflux and at this point in time using a PPI and does not require famotidine on a regular basis.  She has had 2 Pfizer Covid vaccinations and has also had native Covid pneumonitis in the winter 2020.  She has already received her flu vaccine.  Allergies as of 03/16/2020      Reactions   Amoxicillin Anaphylaxis, Swelling   Etodolac Anaphylaxis, Shortness Of Breath    lips swelled   Penicillins Anaphylaxis, Swelling   DID THE REACTION INVOLVE: Swelling of the face/tongue/throat, SOB, or low BP? Yes Sudden or severe rash/hives, skin peeling, or the inside of the mouth or nose? No Did it require medical treatment? No When did it last happen?20 years ago If all above answers are "NO", may proceed with cephalosporin use.   Ampicillin Swelling   Augmentin [amoxicillin-pot Clavulanate] Swelling   Diclofenac Sodium    Unknown  reaction    Pantoprazole Sodium    unspecified   Adhesive [tape] Rash      Medication List      acetaminophen 500 MG tablet Commonly known as: TYLENOL Take 1,000 mg by mouth 2 (two) times daily as needed for moderate pain or headache.   albuterol 108 (90 Base) MCG/ACT inhaler Commonly known as: VENTOLIN HFA Inhale 2 puffs into the lungs every 4 (four) hours as needed.   albuterol (2.5 MG/3ML) 0.083% nebulizer solution Commonly known as: PROVENTIL Take 3 mLs (2.5 mg total) by nebulization every 4 (four) hours as needed. USE 1 VIAL VIA NEBULIZER EVERY 4 TO 6 HOURS AS NEEDED FOR COUGH OR WHEEZING   atorvastatin 40 MG tablet Commonly known as: LIPITOR Take 0.5 tablets (20 mg total) by mouth daily.   B12 FOLATE PO Take by mouth.   calcium carbonate 500 MG chewable tablet Commonly known as: TUMS - dosed in mg elemental calcium Chew 2-3 tablets by mouth daily as needed for indigestion or heartburn.   cetirizine 10 MG tablet Commonly known as: ZYRTEC Take 1 tablet (10 mg total) by mouth daily.   CINNAMON PO Take 1 tablet by mouth daily as needed (for high bp).   COLLAGEN PO Take by mouth.   Cranberry 1000 MG Caps Take 1,000 mg by mouth daily.   cyclobenzaprine 10 MG tablet Commonly known as: FLEXERIL TAKE 1 TABLET BY MOUTH AT BEDTIME AS NEEDED FOR BACK OR NECK PAIN OR SPASMS   doxycycline 100 MG capsule Commonly known as:  VIBRAMYCIN Take 1 capsule (100 mg total) by mouth 2 (two) times daily.   Dulera 200-5 MCG/ACT Aero Generic drug: mometasone-formoterol Inhale 2 puffs into the lungs 2 (two) times daily.   EPINEPHrine 0.3 mg/0.3 mL Soaj injection Commonly known as: EpiPen 2-Pak Inject 0.3 mLs (0.3 mg total) into the muscle once for 1 dose.   famotidine 20 MG tablet Commonly known as: PEPCID Take 1 tablet (20 mg total) by mouth 2 (two) times daily.   fluticasone 50 MCG/ACT nasal spray Commonly known as: FLONASE SHAKE LIQUID AND USE 2 SPRAYS IN EACH NOSTRIL  DAILY   ipratropium 0.06 % nasal spray Commonly known as: ATROVENT Use 2 sprays in each nostril every 6 hours if needed to dry up nose   montelukast 10 MG tablet Commonly known as: SINGULAIR Take 1 tablet (10 mg total) by mouth at bedtime.   norethindrone 5 MG tablet Commonly known as: AYGESTIN Take 2.5 mg by mouth at bedtime.   nystatin powder Generic drug: nystatin APPLY TOPICALLY TO THE AFFECTED AREA THREE TIMES DAILY   Pazeo 0.7 % Soln Generic drug: Olopatadine HCl Place 1 drop into both eyes at bedtime.   polyethylene glycol powder 17 GM/SCOOP powder Commonly known as: GLYCOLAX/MIRALAX Take 17 g by mouth 2 (two) times daily as needed.   traMADol 50 MG tablet Commonly known as: ULTRAM Take 1 tablet (50 mg total) by mouth 3 (three) times daily as needed.   triamcinolone 0.025 % ointment Commonly known as: KENALOG Apply topically 2 (two) times daily.   vitamin C 1000 MG tablet Take 1,000 mg by mouth daily.   Xolair 150 MG injection Generic drug: omalizumab INJECT 300 MG UNDER THE SKIN EVERY 28 DAYS   ZINC ACETATE PO Take by mouth.   Zinc Lozg Take 1 lozenge by mouth daily.       Past Medical History:  Diagnosis Date  . Allergic rhinitis   . Anemia   . Anxiety   . Arthritis    knees and back  . Asthma   . Complication of anesthesia   . Congestive heart failure (CHF) ([place])   . Depression   . Gall stone   . GERD (gastroesophageal reflux disease)   . Headache    migraines, stress headaches  . History of kidney stones   . Hypertension   . Hyperthyroidism    during pregnancy  . Pneumonia   . PONV (postoperative nausea and vomiting)   . Sleep apnea    can't use Cpap    Past Surgical History:  Procedure Laterality Date  . ADENOIDECTOMY    . APPLICATION OF WOUND VAC  03/18/2019  . APPLICATION OF WOUND VAC N/A 03/18/2019   Procedure: Application Of Wound Vac;  Surgeon: Donnie Mesa, MD;  Location: [facility];  Service: General;  Laterality: N/A;    . BIOPSY  05/27/2018   Procedure: BIOPSY;  Surgeon: Mauri Pole, MD;  Location: [facility];  Service: Endoscopy;;  . CESAREAN SECTION    . COLONOSCOPY WITH PROPOFOL N/A 05/27/2018   Procedure: COLONOSCOPY WITH PROPOFOL;  Surgeon: Mauri Pole, MD;  Location: [facility];  Service: Endoscopy;  Laterality: N/A;  . CYST EXCISION    . EXPLORATORY LAPAROTOMY  02/03/2019  . INGUINAL HERNIA REPAIR N/A 02/03/2019   Procedure: EXPLORATORY LAPAROTOMY WITH SMALL BOWEL RESECTION AND PRIMARY CLOSURE OF VENTRAL HERNIA;  Surgeon: Donnie Mesa, MD;  Location: [place];  Service: General;  Laterality: N/A;  . OVARIAN CYST REMOVAL    . POLYPECTOMY  05/27/2018   Procedure: POLYPECTOMY;  Surgeon: Mauri Pole, MD;  Location: [facility];  Service: Endoscopy;;  . removal of gallstones    . TONSILLECTOMY    . TYMPANOSTOMY TUBE PLACEMENT    . WOUND EXPLORATION  03/18/2019   abdominal  . WOUND EXPLORATION N/A 03/18/2019   Procedure: ABDOMINAL WOUND EXPLORATION;  Surgeon: Donnie Mesa, MD;  Location: [facility];  Service: General;  Laterality: N/A;    Review of systems negative except as noted in HPI / PMHx or noted below:  Review of Systems  Constitutional: Negative.   HENT: Negative.   Eyes: Negative.   Respiratory: Negative.   Cardiovascular: Negative.   Gastrointestinal: Negative.   Genitourinary: Negative.   Musculoskeletal: Negative.   Skin: Negative.   Neurological: Negative.   Endo/Heme/Allergies: Negative.   Psychiatric/Behavioral: Negative.      Objective:   Vitals:   03/16/20 1141  BP: 128/68  Pulse: 64  Resp: 16  SpO2: 98%          Physical Exam Constitutional:      Appearance: She is not diaphoretic.  HENT:     Head: Normocephalic.     Right Ear: Tympanic membrane, ear canal and external ear normal.     Left Ear: Tympanic membrane, ear canal and external ear normal.     Nose: Nose normal. No mucosal edema or rhinorrhea.     Mouth/Throat:      Pharynx: Uvula midline. No oropharyngeal exudate.  Eyes:     Conjunctiva/sclera: Conjunctivae normal.  Neck:     Thyroid: No thyromegaly.     Trachea: Trachea normal. No tracheal tenderness or tracheal deviation.  Cardiovascular:     Rate and Rhythm: Normal rate and regular rhythm.     Heart sounds: Normal heart sounds, S1 normal and S2 normal. No murmur heard.   Pulmonary:     Effort: No respiratory distress.     Breath sounds: Normal breath sounds. No stridor. No wheezing or rales.  Lymphadenopathy:     Head:     Right side of head: No tonsillar adenopathy.     Left side of head: No tonsillar adenopathy.     Cervical: No cervical adenopathy.  Skin:    Findings: No erythema or rash.     Nails: There is no clubbing.  Neurological:     Mental Status: She is alert.     Diagnostics:    Spirometry was performed and demonstrated an FEV1 of 2.09 at 79 % of predicted.   Assessment and Plan:   1. Asthma, severe persistent, well-controlled   2. Other allergic rhinitis   3. LPRD (laryngopharyngeal reflux disease)      1. Continue Dulera 200 2 inhalations 1-2 times per day depending on asthma activity  2. Continue Flonase 1 spray each nostril 1-2 times per day depending on upper airway symptom activity  3. Continue montelukast 10 mg daily  4. Continue Xolair (Epi-Pen)  5. Add Qvar 80 Redihaler 2 inhalations twice a day to [facility] while "sick"  6. If needed:   A. Pro-air HFA  B. Antihistamine   C. Triamcinolone 0.025% ointment   D. Famotidine 20 mg daily  E. Nasal ipratropium 0.06% - 2 sprays each nostril every 6 hours  7. Return to clinic in 6 months or earlier if problem  Shaniquia is doing very well on her current plan and she will continue to use a collection of anti-inflammatory agents for her airway and she has several symptomatic medications that she  can use as needed as noted above.  Assuming she does well with this plan I will see her back in this clinic in 6 months or  earlier if there is a problem.  Allena Katz, MD Allergy / Immunology [place]

## 2020-03-16 NOTE — Patient Instructions (Addendum)
  1. Continue Dulera 200 2 inhalations 1-2 times per day depending on asthma activity  2. Continue Flonase 1 spray each nostril 1-2 times per day depending on upper airway symptom activity  3. Continue montelukast 10 mg daily  4. Continue Xolair (Epi-Pen)  5. Add Qvar 80 Redihaler 2 inhalations twice a day to [facility] while "sick"  6. If needed:   A. Pro-air HFA  B. Antihistamine   C. Triamcinolone 0.025% ointment   D. Famotidine 20 mg daily  E. Nasal ipratropium 0.06% - 2 sprays each nostril every 6 hours  7. Return to clinic in 6 months or earlier if problem

## 2020-03-22 ENCOUNTER — Encounter: Payer: Self-pay | Admitting: Allergy and Immunology

## 2020-04-07 ENCOUNTER — Other Ambulatory Visit: Payer: Self-pay

## 2020-04-07 ENCOUNTER — Encounter: Payer: Self-pay | Admitting: Family Medicine

## 2020-04-07 ENCOUNTER — Telehealth: Payer: Self-pay | Admitting: *Deleted

## 2020-04-07 ENCOUNTER — Ambulatory Visit (INDEPENDENT_AMBULATORY_CARE_PROVIDER_SITE_OTHER): Payer: Medicaid Other | Admitting: Family Medicine

## 2020-04-07 VITALS — BP 134/68 | HR 80 | Ht 64.0 in | Wt 252.0 lb

## 2020-04-07 DIAGNOSIS — K5900 Constipation, unspecified: Secondary | ICD-10-CM | POA: Diagnosis not present

## 2020-04-07 DIAGNOSIS — Z23 Encounter for immunization: Secondary | ICD-10-CM | POA: Diagnosis not present

## 2020-04-07 DIAGNOSIS — L0591 Pilonidal cyst without abscess: Secondary | ICD-10-CM

## 2020-04-07 DIAGNOSIS — E7800 Pure hypercholesterolemia, unspecified: Secondary | ICD-10-CM | POA: Diagnosis not present

## 2020-04-07 MED ORDER — ZINC OXIDE 16 % EX OINT: 1.0000 g | TOPICAL_OINTMENT | Freq: Two times a day (BID) | CUTANEOUS | 3 refills | Status: DC

## 2020-04-07 NOTE — Patient Instructions (Signed)
covid booster today See me in three months. I sent in some ointment for your pilonidal cyst

## 2020-04-07 NOTE — Telephone Encounter (Signed)
Pt states that the medicine PCP sent in is not covered by medicaid and will cost her over $20.  She is requesting something to be called in that is covered by medicaid. Christen Bame, CMA

## 2020-04-08 LAB — LDL CHOLESTEROL, DIRECT: LDL Direct: 77 mg/dL (ref 0–99)

## 2020-04-08 MED ORDER — NYSTATIN-TRIAMCINOLONE 100000-0.1 UNIT/GM-% EX OINT: 1.0000 "application " | TOPICAL_OINTMENT | Freq: Two times a day (BID) | CUTANEOUS | 1 refills | Status: DC

## 2020-04-08 NOTE — Telephone Encounter (Signed)
Called with lab results.  No change in meds.  Also meant to prescribe zinc oxide with nystatin.  Will change.

## 2020-04-09 ENCOUNTER — Encounter: Payer: Self-pay | Admitting: Family Medicine

## 2020-04-09 NOTE — Assessment & Plan Note (Signed)
Still at goal on lower dose statin

## 2020-04-09 NOTE — Progress Notes (Signed)
    SUBJECTIVE:   CHIEF COMPLAINT / HPI:   FU multiple problems. 1. Has constipation.  She is taking an OTC stool softener.  She knows that she is not drinking enough water.  Water "tastes like metal) 2. See in urgent care for a left breast mass.  It is now healing well.   3. Morbid obesity.  Continues to do great.  Weight loss has slowed but continues.  She is not exercising much. 4. Due for a COVID booster. 5. She has had a recent pap, which I can see in care everywhere but still showing due on our health Maint tab.   6. I cut her statin dose.  Will recheck statin.  Her goal is LDL<100.   7. Previously diabetic.  Not on meds.  With her excellent wt loss, A1C= 5.4.  8. Known pilonidal cyst.  She wants to put off removal.  Irritation around cyst opening.      OBJECTIVE:   BP 134/68   Pulse 80   Ht 5\' 4"  (1.626 m)   Wt 252 lb (114.3 kg)   LMP 02/03/2019 Comment: Tubal Ligation  SpO2 98%   BMI 43.26 kg/m   Lungs clear Cardiac RRR without m or g Breasts skin infection and abscess has cleared.  Red and irritated around pilonidal cyst opening.   ASSESSMENT/PLAN:   Pilonidal cyst Topical to treat skin irritation.  HYPERCHOLESTEROLEMIA Still at goal on lower dose statin  Morbid obesity ([place]) Continues to improve.  Congratulated on her hard work.  Discussed phasing in more exercise.  Constipation Encouraged more PO fluid intake.       [name], MD [place]

## 2020-04-09 NOTE — Assessment & Plan Note (Addendum)
Continues to improve.  Congratulated on her hard work.  Discussed phasing in more exercise.

## 2020-04-09 NOTE — Assessment & Plan Note (Signed)
Topical to treat skin irritation.

## 2020-04-09 NOTE — Assessment & Plan Note (Signed)
Encouraged more PO fluid intake.

## 2020-04-12 ENCOUNTER — Telehealth: Payer: Self-pay | Admitting: *Deleted

## 2020-04-12 NOTE — Telephone Encounter (Signed)
Called and LM to use vasoline around openning of the pilonidal cyst.

## 2020-04-12 NOTE — Telephone Encounter (Signed)
Received fax saying that insurance may cover if you send 2 different prescriptions for the nystatin/triamcinolone.

## 2020-04-13 ENCOUNTER — Other Ambulatory Visit: Payer: Self-pay | Admitting: *Deleted

## 2020-04-13 ENCOUNTER — Ambulatory Visit (INDEPENDENT_AMBULATORY_CARE_PROVIDER_SITE_OTHER): Payer: Medicaid Other | Admitting: *Deleted

## 2020-04-13 ENCOUNTER — Other Ambulatory Visit: Payer: Self-pay

## 2020-04-13 DIAGNOSIS — J4550 Severe persistent asthma, uncomplicated: Secondary | ICD-10-CM

## 2020-04-13 MED ORDER — XOLAIR 150 MG ~~LOC~~ SOLR: SUBCUTANEOUS | 11 refills | Status: DC

## 2020-04-19 ENCOUNTER — Other Ambulatory Visit: Payer: Self-pay

## 2020-04-19 ENCOUNTER — Ambulatory Visit (INDEPENDENT_AMBULATORY_CARE_PROVIDER_SITE_OTHER): Payer: Medicaid Other | Admitting: Family Medicine

## 2020-04-19 ENCOUNTER — Encounter: Payer: Self-pay | Admitting: Family Medicine

## 2020-04-19 DIAGNOSIS — L219 Seborrheic dermatitis, unspecified: Secondary | ICD-10-CM | POA: Diagnosis not present

## 2020-04-19 DIAGNOSIS — F411 Generalized anxiety disorder: Secondary | ICD-10-CM

## 2020-04-19 DIAGNOSIS — I5030 Unspecified diastolic (congestive) heart failure: Secondary | ICD-10-CM | POA: Diagnosis not present

## 2020-04-19 DIAGNOSIS — E860 Dehydration: Secondary | ICD-10-CM | POA: Diagnosis not present

## 2020-04-19 DIAGNOSIS — L0100 Impetigo, unspecified: Secondary | ICD-10-CM | POA: Diagnosis not present

## 2020-04-19 MED ORDER — CLONAZEPAM 0.5 MG PO TABS: 0.5000 mg | ORAL_TABLET | Freq: Every day | ORAL | 5 refills | Status: DC

## 2020-04-19 MED ORDER — MUPIROCIN 2 % EX OINT: 1.0000 "application " | TOPICAL_OINTMENT | Freq: Two times a day (BID) | CUTANEOUS | 0 refills | Status: DC

## 2020-04-19 NOTE — Assessment & Plan Note (Signed)
With her lightheadedness, I will decrease her clonazepam from 1 to 0.5 mg.

## 2020-04-19 NOTE — Assessment & Plan Note (Signed)
May have a mild bacterial infection of ear but may be all seborrhea.  Removed earings and Rx with bactroban.

## 2020-04-19 NOTE — Assessment & Plan Note (Signed)
I believe this has largely resolved with her weight loss.  Will DC lasix.

## 2020-04-19 NOTE — Patient Instructions (Signed)
Use the oint to treat the ear. Stop the lasix altogether.  In fact, I want you to drink more. I sent in a new prescription for a lower dose of clonazapam that you should only take at night.   Keep up the great work on the weight loss.

## 2020-04-19 NOTE — Assessment & Plan Note (Signed)
I am calling ear mild infection impetigo.

## 2020-04-19 NOTE — Assessment & Plan Note (Signed)
I believe she has mild chronic dehydration from poor PO fluid intake.  I problem solved with her about increasing her PO intake.

## 2020-04-19 NOTE — Progress Notes (Signed)
    SUBJECTIVE:   CHIEF COMPLAINT / HPI:    Right ear redness and lightheadedness 1. Right earlobe redness where she has a double ear piercing.  No fever or pain.   2. Lightheadedness, feels like she might pass out.  She has had considerable weight loss, which continues.  She has trouble drinking enough fluids.  Rarely takes lasix, prescribed for chronic venous insufficiency of legs.  Also on long acting benzo, which may increase fall risk.  Clonazepam 1 mg prescribed bid but only taking at night.     OBJECTIVE:   BP 108/60   Pulse 76   Wt 250 lb (113.4 kg)   LMP 02/03/2019 Comment: Tubal Ligation  SpO2 99%   BMI 42.91 kg/m   Right ear lobe (away from cart) mildly red.  No purulence or fluctuance.  Has considerable seborrhea Lungs clear Cardiac RRR without m or g   ASSESSMENT/PLAN:   Seborrheic eczema May have a mild bacterial infection of ear but may be all seborrhea.  Removed earings and Rx with bactroban.  Impetigo I am calling ear mild infection impetigo.  Diastolic CHF with preserved left ventricular function, NYHA class 2 ([place]) I believe this has largely resolved with her weight loss.  Will DC lasix.  Dehydration I believe she has mild chronic dehydration from poor PO fluid intake.  I problem solved with her about increasing her PO intake.    Anxiety state With her lightheadedness, I will decrease her clonazepam from 1 to 0.5 mg.       Kelsey Resides, MD [place]

## 2020-04-20 ENCOUNTER — Telehealth: Payer: Self-pay

## 2020-04-20 DIAGNOSIS — F411 Generalized anxiety disorder: Secondary | ICD-10-CM

## 2020-04-20 DIAGNOSIS — L0100 Impetigo, unspecified: Secondary | ICD-10-CM

## 2020-04-20 MED ORDER — MUPIROCIN 2 % EX OINT: 1.0000 "application " | TOPICAL_OINTMENT | Freq: Two times a day (BID) | CUTANEOUS | 0 refills | Status: DC

## 2020-04-20 MED ORDER — CLONAZEPAM 0.5 MG PO TABS: 0.5000 mg | ORAL_TABLET | Freq: Every day | ORAL | 5 refills | Status: DC

## 2020-04-20 NOTE — Telephone Encounter (Signed)
Done

## 2020-04-20 NOTE — Telephone Encounter (Signed)
Patient calls nurse line regarding issues with picking up prescriptions from office visit on 04/19/20.   Patient states that [facility] on [address] is not operating. Attempted multiple times to reach pharmacy, unable to do so. Please advise if prescriptions can be resent to [facility] on [address]. Verified with [facility] on [address] that patient has not picked up prescriptions that were prescribed on 11/15.  To PCP  Talbot Grumbling, RN

## 2020-05-11 ENCOUNTER — Ambulatory Visit (INDEPENDENT_AMBULATORY_CARE_PROVIDER_SITE_OTHER): Payer: Medicaid Other

## 2020-05-11 ENCOUNTER — Other Ambulatory Visit: Payer: Self-pay

## 2020-05-11 DIAGNOSIS — J4550 Severe persistent asthma, uncomplicated: Secondary | ICD-10-CM

## 2020-05-19 ENCOUNTER — Other Ambulatory Visit: Payer: Self-pay | Admitting: Allergy and Immunology

## 2020-05-31 DIAGNOSIS — F4310 Post-traumatic stress disorder, unspecified: Secondary | ICD-10-CM | POA: Diagnosis not present

## 2020-05-31 DIAGNOSIS — F429 Obsessive-compulsive disorder, unspecified: Secondary | ICD-10-CM | POA: Diagnosis not present

## 2020-05-31 DIAGNOSIS — F329 Major depressive disorder, single episode, unspecified: Secondary | ICD-10-CM | POA: Diagnosis not present

## 2020-06-08 ENCOUNTER — Ambulatory Visit (INDEPENDENT_AMBULATORY_CARE_PROVIDER_SITE_OTHER): Payer: Medicaid Other

## 2020-06-08 ENCOUNTER — Other Ambulatory Visit: Payer: Self-pay

## 2020-06-08 VITALS — Wt 238.8 lb

## 2020-06-08 DIAGNOSIS — J4550 Severe persistent asthma, uncomplicated: Secondary | ICD-10-CM

## 2020-06-18 ENCOUNTER — Other Ambulatory Visit: Payer: Self-pay | Admitting: Family Medicine

## 2020-06-18 DIAGNOSIS — E7800 Pure hypercholesterolemia, unspecified: Secondary | ICD-10-CM

## 2020-07-06 ENCOUNTER — Ambulatory Visit: Payer: Self-pay

## 2020-07-06 ENCOUNTER — Other Ambulatory Visit: Payer: Self-pay

## 2020-07-06 DIAGNOSIS — J4550 Severe persistent asthma, uncomplicated: Secondary | ICD-10-CM

## 2020-07-21 DIAGNOSIS — H40033 Anatomical narrow angle, bilateral: Secondary | ICD-10-CM | POA: Diagnosis not present

## 2020-07-21 DIAGNOSIS — H0102B Squamous blepharitis left eye, upper and lower eyelids: Secondary | ICD-10-CM | POA: Diagnosis not present

## 2020-07-21 DIAGNOSIS — H0102A Squamous blepharitis right eye, upper and lower eyelids: Secondary | ICD-10-CM | POA: Diagnosis not present

## 2020-07-21 DIAGNOSIS — H47323 Drusen of optic disc, bilateral: Secondary | ICD-10-CM | POA: Diagnosis not present

## 2020-07-21 DIAGNOSIS — H40023 Open angle with borderline findings, high risk, bilateral: Secondary | ICD-10-CM | POA: Diagnosis not present

## 2020-08-03 ENCOUNTER — Ambulatory Visit: Payer: Self-pay

## 2020-08-03 ENCOUNTER — Other Ambulatory Visit: Payer: Self-pay

## 2020-08-03 DIAGNOSIS — J4550 Severe persistent asthma, uncomplicated: Secondary | ICD-10-CM

## 2020-08-23 IMAGING — CR ABDOMEN - 1 VIEW
1 series · 1 of 1 positions shown · non-contrast
Comparison: None.

CLINICAL DATA: NG tube placement.

EXAM:
ABDOMEN - 1 VIEW

[abdomen kub]
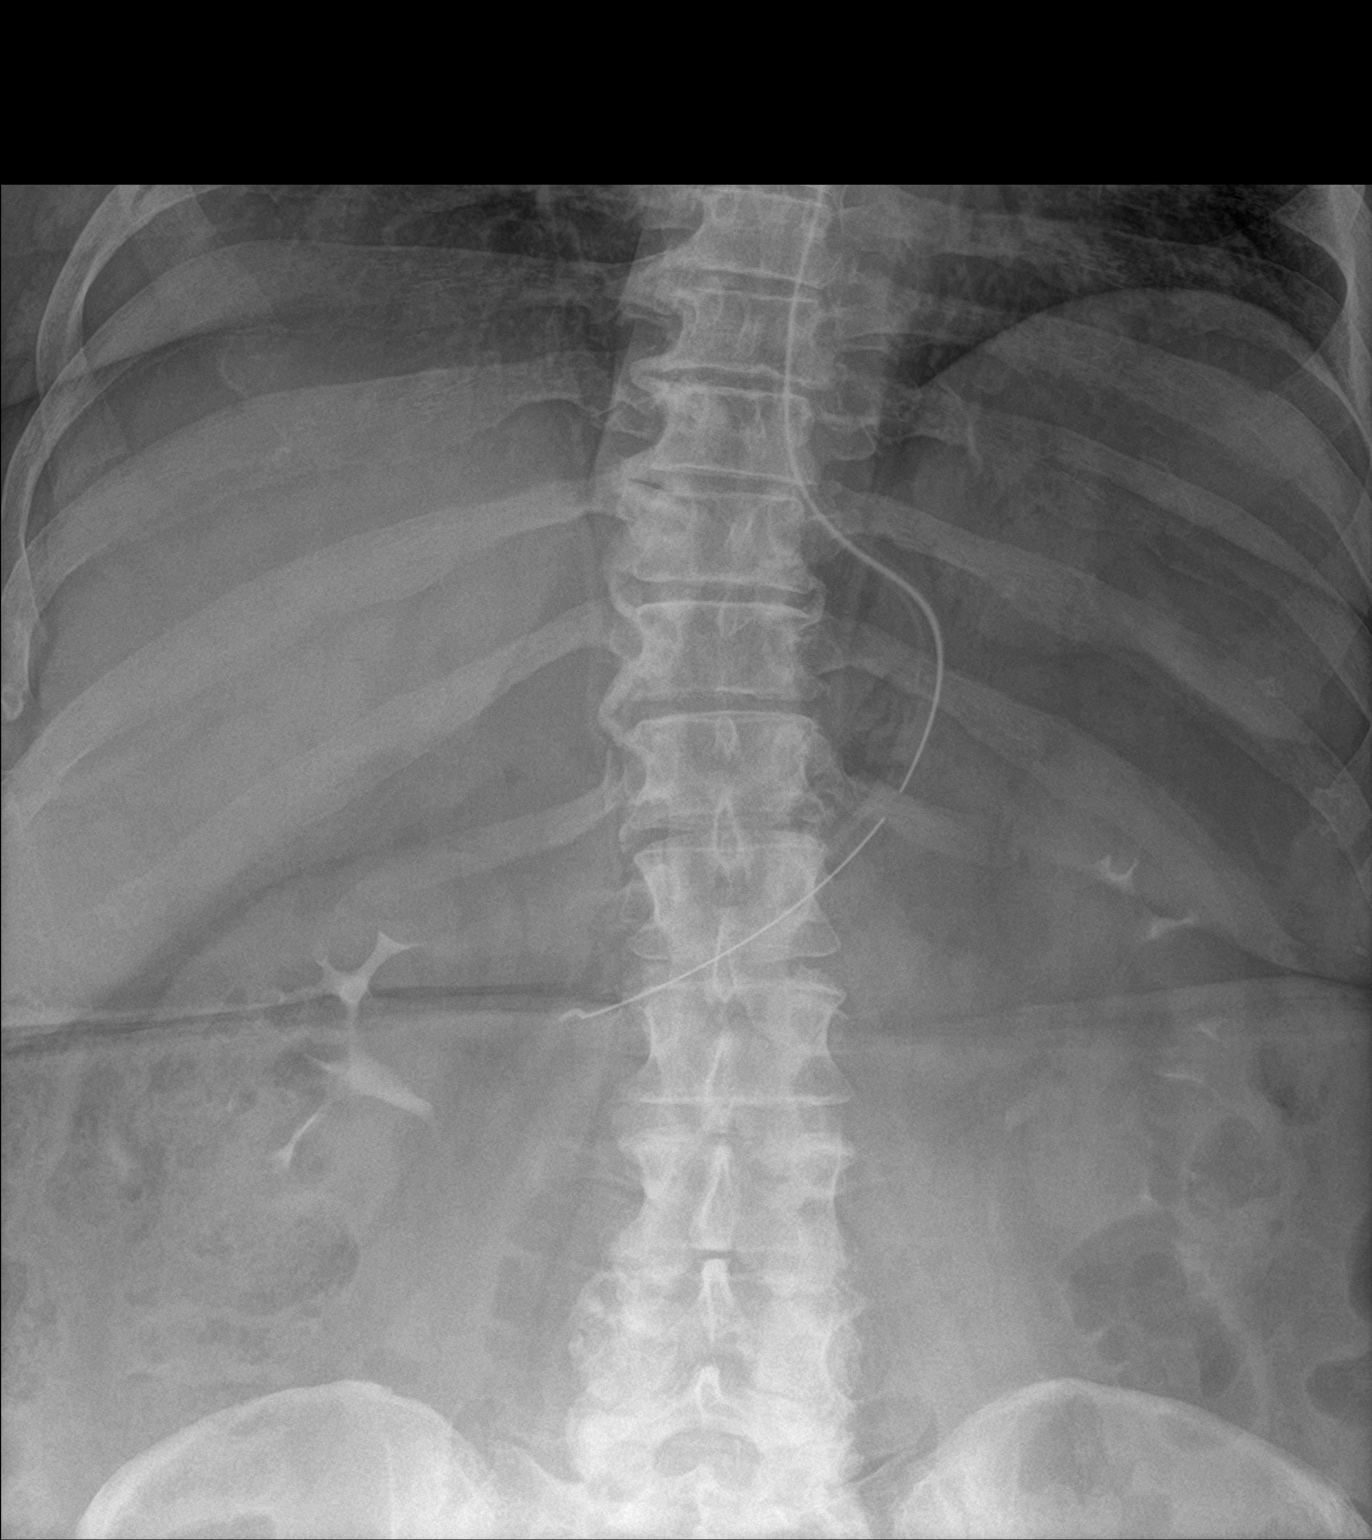

[1 of 1 positions shown; findings below may reference images not displayed]

FINDINGS: An NG tube is identified with tip overlying the distal stomach.
IMPRESSION: NG tube tip overlying the distal stomach.

## 2020-08-28 IMAGING — DX DG ABD PORTABLE 1V
1 series · 3 of 3 positions shown · non-contrast
Comparison: 02/02/2019

CLINICAL DATA: Abdominal pain Sohail/Lstr Gasm and ventral hernia repair.
''Very large body habitus-with lots of extra on right abdomen''

EXAM:
PORTABLE ABDOMEN - 1 VIEW

[Series 1: abdomen · 0.14mm/px · 3 of 3 slices shown]
[im 1/3]
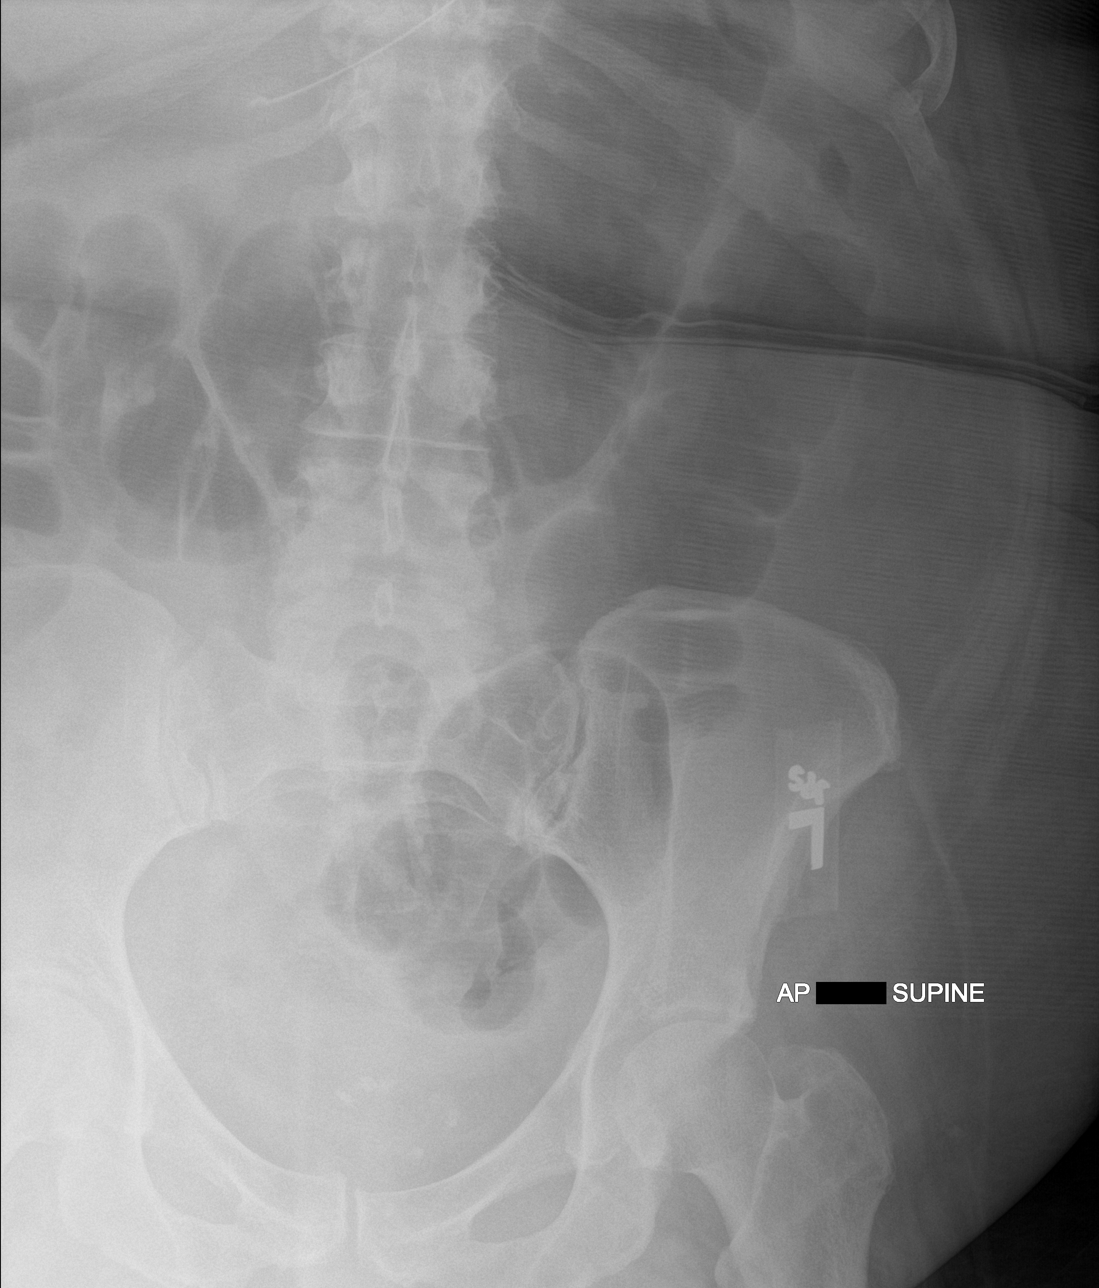
[im 2/3]
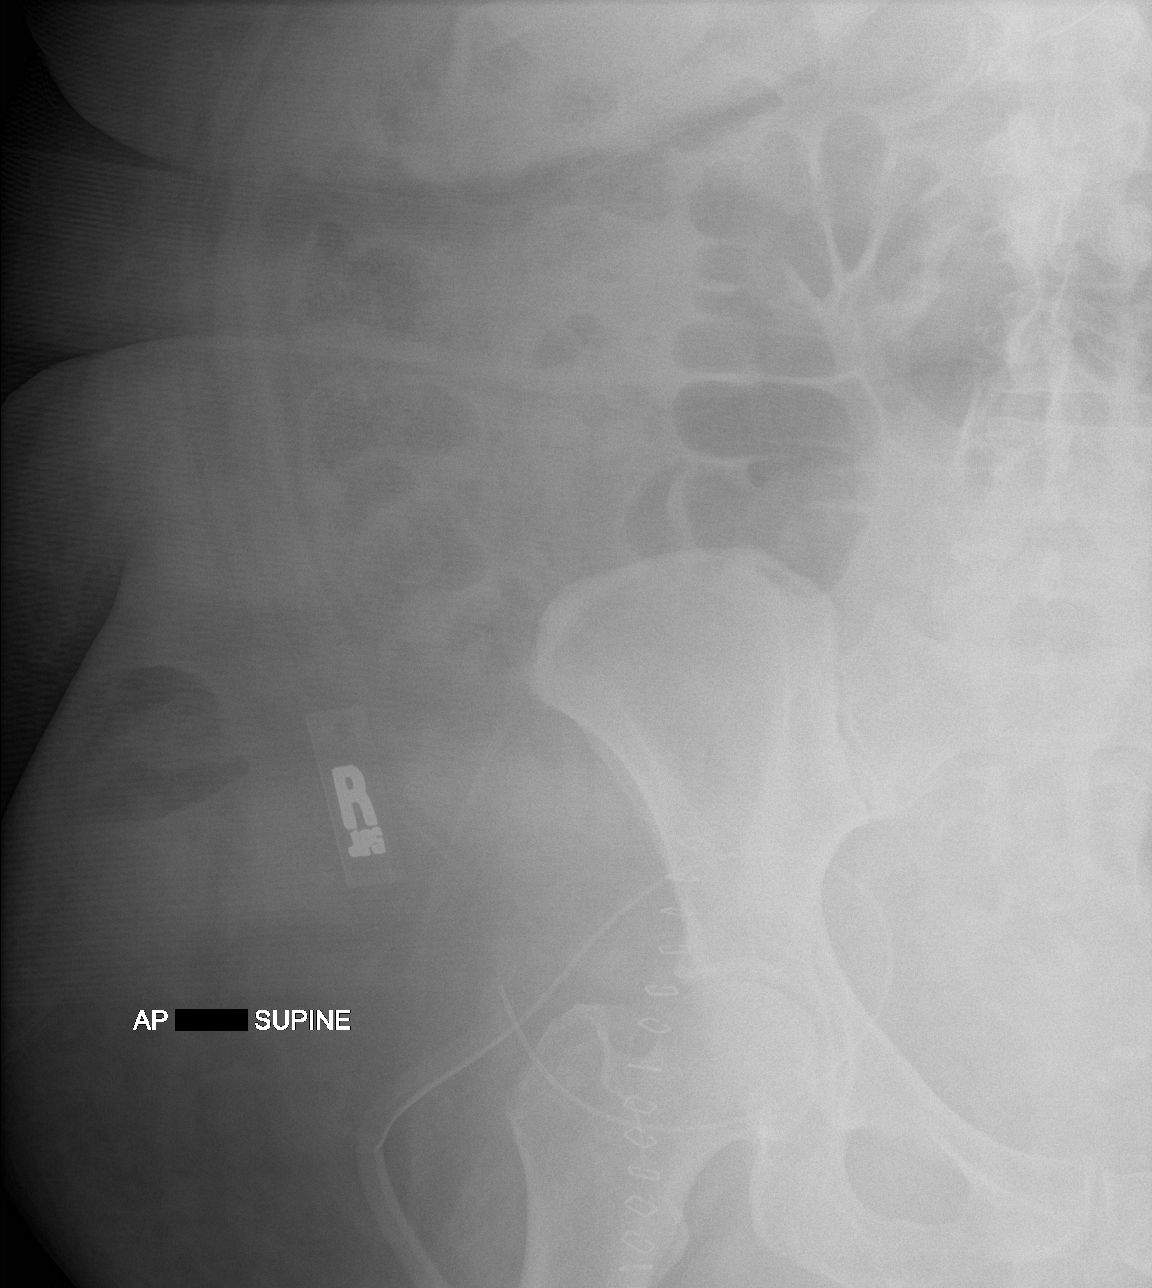
[im 3/3]
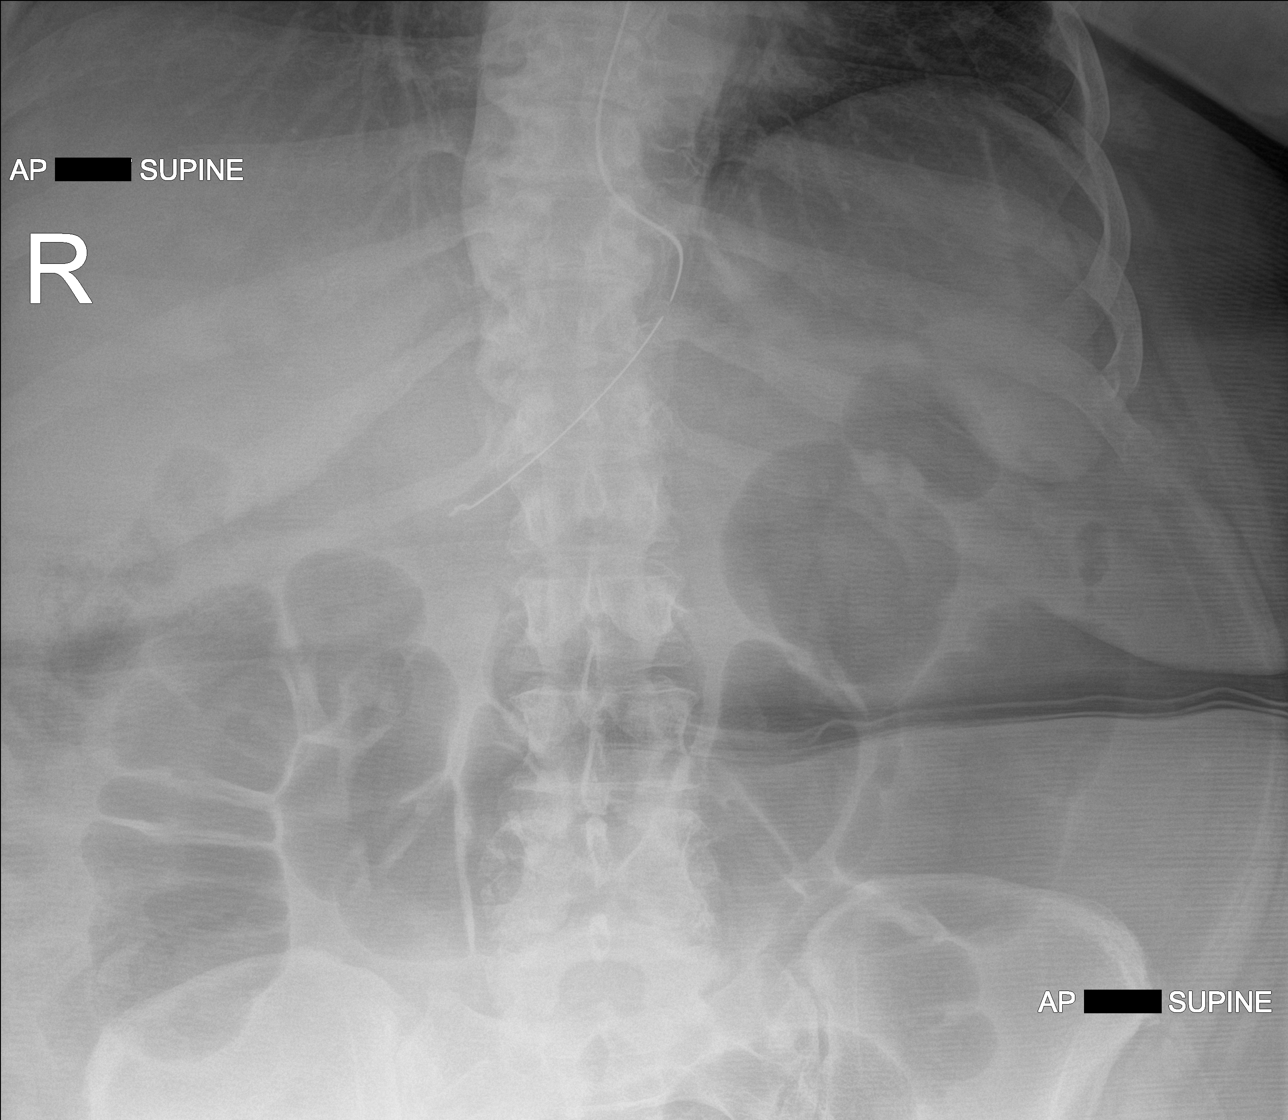

[3 of 3 positions shown; findings below may reference images not displayed]

FINDINGS: Nasogastric tube is in place. There is mild gaseous distension of
colonic loops, without significant dilatation. No evidence for small
bowel dilatation. Surgical clips and a surgical drain overlies the
RIGHT hip. No evidence for free intraperitoneal air.
IMPRESSION: 1. Nasogastric tube in place.
2. Nonobstructive bowel gas pattern.

## 2020-08-31 ENCOUNTER — Other Ambulatory Visit: Payer: Self-pay

## 2020-08-31 ENCOUNTER — Ambulatory Visit (INDEPENDENT_AMBULATORY_CARE_PROVIDER_SITE_OTHER): Payer: Medicaid Other | Admitting: *Deleted

## 2020-08-31 DIAGNOSIS — J4550 Severe persistent asthma, uncomplicated: Secondary | ICD-10-CM

## 2020-09-12 ENCOUNTER — Encounter (HOSPITAL_COMMUNITY): Payer: Self-pay

## 2020-09-12 ENCOUNTER — Emergency Department (HOSPITAL_COMMUNITY)
Admission: EM | Admit: 2020-09-12 | Discharge: 2020-09-12 | Disposition: A | Discharge status: Home / Self Care | Payer: Medicaid Other | Attending: Emergency Medicine | Admitting: Emergency Medicine

## 2020-09-12 ENCOUNTER — Other Ambulatory Visit: Payer: Self-pay

## 2020-09-12 DIAGNOSIS — J45909 Unspecified asthma, uncomplicated: Secondary | ICD-10-CM | POA: Diagnosis not present

## 2020-09-12 DIAGNOSIS — I5030 Unspecified diastolic (congestive) heart failure: Secondary | ICD-10-CM | POA: Diagnosis not present

## 2020-09-12 DIAGNOSIS — K5900 Constipation, unspecified: Secondary | ICD-10-CM | POA: Diagnosis not present

## 2020-09-12 DIAGNOSIS — I110 Hypertensive heart disease with heart failure: Secondary | ICD-10-CM | POA: Diagnosis not present

## 2020-09-12 MED ORDER — SORBITOL 70 % SOLN
960.0000 mL | TOPICAL_OIL | Freq: Once | ORAL | Status: AC
  Administered 2020-09-12: 960 mL via RECTAL
  Filled 2020-09-12: qty 473

## 2020-09-12 NOTE — Discharge Instructions (Addendum)
Stay on MiraLAX 3 times a day until having 3 bowel movements a day, then decrease to 2 times a day, until having 2 bowel movements today, then decrease to once a day, for 1 month.  Try to increase the amount of water in your diet, at least a half a gallon every day.  Also try to eat foods which contain lots of fiber.  See the attached instructions.  See your primary care doctor for checkup next week.

## 2020-09-12 NOTE — ED Notes (Signed)
Patient is resting comfortably. 

## 2020-09-12 NOTE — ED Provider Notes (Signed)
[facility] EMERGENCY DEPARTMENT Provider Note   CSN: [id number] Arrival date & time: 09/12/20  1107     History Chief Complaint  Patient presents with  . Abdominal Pain    Kelsey Bullock is a 60 y.o. female.  HPI Kelsey Bullock is a 60 y.o. female presents for evaluation of recurrent constipation, and concern for fecal impaction.  She states yesterday she attempted to have a bowel movement by removing stool from her rectum but was not able to pass a stool after that.  Today she was eating and had to stop eating because it caused abdominal pain.  She has intermittent similar symptoms with both trouble eating and constipation, which occur a couple of times a month.  She uses laxatives and MiraLAX, intermittently.  She has had problems similar to this since colon surgery for incarcerated hernia and subsequent surgical complication, 2 years ago.  She denies current fever, chills, persistent abdominal pain, nausea, vomiting, diarrhea, change in urinary habits or symptoms.  There are no other known modifying factors.     Past Medical History:  Diagnosis Date  . Allergic rhinitis   . Anemia   . Anxiety   . Arthritis    knees and back  . Asthma   . Complication of anesthesia   . Congestive heart failure (CHF) ([place])   . Depression   . Gall stone   . GERD (gastroesophageal reflux disease)   . Headache    migraines, stress headaches  . History of kidney stones   . Hypertension   . Hyperthyroidism    during pregnancy  . Pneumonia   . PONV (postoperative nausea and vomiting)   . Sleep apnea    can't use Cpap    Patient Active Problem List   Diagnosis Date Noted  . Impetigo 04/19/2020  . Dehydration 04/19/2020  . Pap smear for cervical cancer screening 02/25/2020  . Constipation 03/22/2019  . Hx of adenomatous colonic polyps   . Pilonidal cyst 02/22/2018  . Diastolic CHF with preserved left ventricular function, NYHA class 2 ([place]) 12/14/2016  . GERD  (gastroesophageal reflux disease) 02/06/2015  . Severe persistent asthma 02/06/2015  . Depression, major, recurrent ([place]) 02/19/2014  . Sleep apnea 03/10/2009  . Seborrheic eczema 07/17/2007  . LOW BACK PAIN SYNDROME 11/26/2006  . HYPERCHOLESTEROLEMIA 08/02/2006  . Morbid obesity ([place]) 08/02/2006  . Anxiety state 08/02/2006  . PSEUDOTUMOR CEREBRI 08/02/2006  . OSTEOARTHRITIS, LOWER LEG 08/02/2006  . FIBROMYALGIA, FIBROMYOSITIS 08/02/2006    Past Surgical History:  Procedure Laterality Date  . ADENOIDECTOMY    . APPLICATION OF WOUND VAC  03/18/2019  . APPLICATION OF WOUND VAC N/A 03/18/2019   Procedure: Application Of Wound Vac;  Surgeon: Donnie Mesa, MD;  Location: [facility];  Service: General;  Laterality: N/A;  . BIOPSY  05/27/2018   Procedure: BIOPSY;  Surgeon: Mauri Pole, MD;  Location: [facility];  Service: Endoscopy;;  . CESAREAN SECTION    . COLONOSCOPY WITH PROPOFOL N/A 05/27/2018   Procedure: COLONOSCOPY WITH PROPOFOL;  Surgeon: Mauri Pole, MD;  Location: [facility];  Service: Endoscopy;  Laterality: N/A;  . CYST EXCISION    . EXPLORATORY LAPAROTOMY  02/03/2019  . INGUINAL HERNIA REPAIR N/A 02/03/2019   Procedure: EXPLORATORY LAPAROTOMY WITH SMALL BOWEL RESECTION AND PRIMARY CLOSURE OF VENTRAL HERNIA;  Surgeon: Donnie Mesa, MD;  Location: [facility];  Service: General;  Laterality: N/A;  . OVARIAN CYST REMOVAL    . POLYPECTOMY  05/27/2018  Procedure: POLYPECTOMY;  Surgeon: Mauri Pole, MD;  Location: [facility];  Service: Endoscopy;;  . removal of gallstones    . TONSILLECTOMY    . TYMPANOSTOMY TUBE PLACEMENT    . WOUND EXPLORATION  03/18/2019   abdominal  . WOUND EXPLORATION N/A 03/18/2019   Procedure: ABDOMINAL WOUND EXPLORATION;  Surgeon: Donnie Mesa, MD;  Location: [facility];  Service: General;  Laterality: N/A;     OB History   No obstetric history on file.     Family History  Problem Relation Age of Onset  . Diabetes Mother    . Diabetes Paternal Uncle   . Allergic rhinitis Neg Hx   . Angioedema Neg Hx   . Asthma Neg Hx   . Atopy Neg Hx   . Eczema Neg Hx   . Immunodeficiency Neg Hx   . Urticaria Neg Hx     Social History   Tobacco Use  . Smoking status: Never Smoker  . Smokeless tobacco: Never Used  Vaping Use  . Vaping Use: Never used  Substance Use Topics  . Alcohol use: No  . Drug use: No    Home Medications Prior to Admission medications   Medication Sig Start Date End Date Taking? Authorizing Provider  acetaminophen (TYLENOL) 500 MG tablet Take 1,000 mg by mouth 2 (two) times daily as needed for moderate pain or headache.    [provider]  albuterol (PROVENTIL) (2.5 MG/3ML) 0.083% nebulizer solution Take 3 mLs (2.5 mg total) by nebulization every 4 (four) hours as needed. USE 1 VIAL VIA NEBULIZER EVERY 4 TO 6 HOURS AS NEEDED FOR COUGH OR WHEEZING 05/20/19   Kozlow, Donnamarie Poag, MD  albuterol (VENTOLIN HFA) 108 (90 Base) MCG/ACT inhaler Inhale 2 puffs into the lungs every 4 (four) hours as needed. 05/20/19   Kozlow, Donnamarie Poag, MD  Ascorbic Acid (VITAMIN C) 1000 MG tablet Take 1,000 mg by mouth daily.    [provider]  atorvastatin (LIPITOR) 40 MG tablet TAKE 1 TABLET(40 MG) BY MOUTH AT BEDTIME 06/18/20   [name], MD  Bepotastine Besilate 1.5 % SOLN INSTILL 1 DROP IN BOTH EYES TWICE DAILY 11/11/19   [provider]  calcium carbonate (TUMS - DOSED IN MG ELEMENTAL CALCIUM) 500 MG chewable tablet Chew 2-3 tablets by mouth daily as needed for indigestion or heartburn.    [provider]  cetirizine (ZYRTEC) 10 MG tablet TAKE 1 TABLET(10 MG) BY MOUTH DAILY 05/19/20   Kozlow, Donnamarie Poag, MD  Cholecalciferol (D3 VITAMIN PO) Take by mouth.    [provider]  CINNAMON PO Take 1 tablet by mouth daily as needed (for high bp).    [provider]  clonazePAM (KLONOPIN) 0.5 MG tablet Take 1 tablet (0.5 mg total) by mouth at bedtime. 04/20/20   [name], MD  Cobalamin Combinations (B12 FOLATE PO) Take by mouth.    [provider]  COLLAGEN PO Take by mouth.    [provider]  Cranberry 1000 MG CAPS Take 1,000 mg by mouth daily.    [provider]  Cyanocobalamin (B-12 PO) Take by mouth.    [provider]  cyclobenzaprine (FLEXERIL) 10 MG tablet TAKE 1 TABLET BY MOUTH AT BEDTIME AS NEEDED FOR BACK OR NECK PAIN OR SPASMS 06/20/19   Hensel, [name], MD  EPINEPHrine (EPIPEN 2-PAK) 0.3 mg/0.3 mL IJ SOAJ injection Inject 0.3 mLs (0.3 mg total) into the muscle once for 1 dose. 05/20/19 06/24/27  Allena Katz  J, MD  famotidine (PEPCID) 20 MG tablet Take 1 tablet (20 mg total) by mouth 2 (two) times daily. 05/20/19   Kozlow, Donnamarie Poag, MD  fluticasone (FLONASE) 50 MCG/ACT nasal spray SHAKE LIQUID AND USE 2 SPRAYS IN EACH NOSTRIL DAILY 09/29/19   Kozlow, Donnamarie Poag, MD  ipratropium (ATROVENT) 0.06 % nasal spray Use 2 sprays in each nostril every 6 hours if needed to dry up nose 09/29/19   Kozlow, Donnamarie Poag, MD  mometasone-formoterol (DULERA) 200-5 MCG/ACT AERO Inhale 2 puffs into the lungs 2 (two) times daily. 09/29/19   Kozlow, Donnamarie Poag, MD  montelukast (SINGULAIR) 10 MG tablet TAKE 1 TABLET(10 MG) BY MOUTH AT BEDTIME 05/19/20   Kozlow, Donnamarie Poag, MD  mupirocin ointment (BACTROBAN) 2 % Apply 1 application topically 2 (two) times daily. Right ear 04/20/20   [name], MD  norethindrone (AYGESTIN) 5 MG tablet Take 2.5 mg by mouth at bedtime. 02/11/19   [provider]  NYSTATIN powder APPLY TOPICALLY TO THE AFFECTED AREA THREE TIMES DAILY 04/14/19   Hensel, [name], MD  Olopatadine HCl (PAZEO) 0.7 % SOLN Place 1 drop into both eyes at bedtime.     [provider]  polyethylene glycol powder (GLYCOLAX/MIRALAX) 17 GM/SCOOP powder Take 17 g by mouth 2 (two) times daily as needed. 09/01/19   [name], MD  Probiotic Product (PROBIOTIC DAILY PO) Take by mouth.    [provider]  Pyridoxine HCl (B-6  PO) Take by mouth.    [provider]  triamcinolone (KENALOG) 0.025 % ointment Apply topically 2 (two) times daily. 05/20/19   Kozlow, Donnamarie Poag, MD  VITAMIN E PO Take by mouth.    [provider]  [name] 150 MG injection INJECT 300 MG UNDER THE SKIN EVERY 28 DAYS 04/13/20   Kozlow, Donnamarie Poag, MD    Allergies    Amoxicillin, Etodolac, Penicillins, Ampicillin, Augmentin [amoxicillin-pot clavulanate], Diclofenac sodium, Pantoprazole sodium, and Adhesive [tape]  Review of Systems   Review of Systems  All other systems reviewed and are negative.   Physical Exam Updated Vital Signs BP 136/66 (BP Location: Right Arm)   Pulse 64   Temp 98.2 F (36.8 C) (Oral)   Resp 18   Ht 5\' 9"  (1.753 m)   Wt 108 kg   LMP 02/03/2019 Comment: Tubal Ligation  SpO2 100%   BMI 35.16 kg/m   Physical Exam Vitals and nursing note reviewed.  Constitutional:      General: She is not in acute distress.    Appearance: She is well-developed. She is not ill-appearing, toxic-appearing or diaphoretic.  HENT:     Head: Normocephalic and atraumatic.     Right Ear: External ear normal.     Left Ear: External ear normal.  Eyes:     Conjunctiva/sclera: Conjunctivae normal.     Pupils: Pupils are equal, round, and reactive to light.  Neck:     Trachea: Phonation normal.  Cardiovascular:     Rate and Rhythm: Normal rate.  Pulmonary:     Effort: Pulmonary effort is normal.  Abdominal:     General: There is no distension.     Palpations: Abdomen is soft.     Tenderness: There is no abdominal tenderness.  Genitourinary:    Comments: Normal anus.  Rectal vault without stool or fecal impaction.  No blood on examining finger. Musculoskeletal:        General: Normal range of motion.     Cervical back: Normal range  of motion and neck supple.  Skin:    General: Skin is warm and dry.  Neurological:     Mental Status: She is alert and oriented to person, place, and time.     Cranial Nerves: No  cranial nerve deficit.     Sensory: No sensory deficit.     Motor: No abnormal muscle tone.     Coordination: Coordination normal.  Psychiatric:        Mood and Affect: Mood normal.        Behavior: Behavior normal.        Thought Content: Thought content normal.        Judgment: Judgment normal.     ED Results / Procedures / Treatments   Labs (all labs ordered are listed, but only abnormal results are displayed) Labs Reviewed - No data to display  EKG None  Radiology No results found.  Procedures Procedures   Medications Ordered in ED Medications  sorbitol, milk of mag, mineral oil, glycerin (SMOG) enema (960 mLs Rectal Given 09/12/20 1404)    ED Course  I have reviewed the triage vital signs and the nursing notes.  Pertinent labs & imaging results that were available during my care of the patient were reviewed by me and considered in my medical decision making (see chart for details).    MDM Rules/Calculators/A&P                           Patient Vitals for the past 24 hrs:  BP Temp Temp src Pulse Resp SpO2 Height Weight  09/12/20 1428 136/66 98.2 F (36.8 C) Oral 64 18 100 % -- --  09/12/20 1215 130/64 98.6 F (37 C) -- 65 18 100 % -- --  09/12/20 1200 (!) 120/59 -- -- (!) 52 -- 96 % -- --  09/12/20 1124 -- -- -- -- -- -- 5\' 9"  (1.753 m) 108 kg  09/12/20 1122 113/87 98.7 F (37.1 C) Oral 65 19 99 % -- --    3:11 PM Reevaluation with update and discussion. After initial assessment and treatment, an updated evaluation reveals she had large volume stool after the enema.  Findings discussed and questions answered. Daleen Bo   Medical Decision Making:  This patient is presenting for evaluation of constipation and difficulty eating, which does require a range of treatment options, and is a complaint that involves a moderate risk of morbidity and mortality. The differential diagnoses include constipation, fecal impaction, malaise. I decided to review old  records, and in summary obese female intermittent constipation, and use of laxatives frequently.  I did not require additional historical information from anyone.    Critical Interventions-clinical evaluation, enema  After These Interventions, the Patient was reevaluated and was found to be able to stool after enema and felt more comfortable.  Findings discussed with the patient and all questions were answered  CRITICAL CARE-no Performed by: Daleen Bo  Nursing Notes Reviewed/ Care Coordinated Applicable Imaging Reviewed Interpretation of Laboratory Data incorporated into ED treatment  The patient appears reasonably screened and/or stabilized for discharge and I doubt any other medical condition or other [facility] requiring further screening, evaluation, or treatment in the ED at this time prior to discharge.  Plan: Home Medications-continue usual, use MiraLAX 3 times daily and gradually decrease, until stooling normally; Home Treatments-high-fiber diet; return here if the recommended treatment, does not improve the symptoms; Recommended follow up-PCP, as needed     Final Clinical Impression(s) /  ED Diagnoses Final diagnoses:  Constipation, unspecified constipation type    Rx / DC Orders ED Discharge Orders    None       Daleen Bo, MD 09/13/20 1257

## 2020-09-12 NOTE — ED Triage Notes (Signed)
PT from home, HX of hernia and colon resection. Here today with abdominal pain, constipated and tried to give herself an enema. Mild relief. Stool that was passed was hard.

## 2020-09-12 NOTE — ED Notes (Signed)
Vital signs stable. 

## 2020-09-12 NOTE — ED Notes (Signed)
Patient given enema one large bowel movement filling bed pan. Dr Vira Agar notified.

## 2020-09-13 ENCOUNTER — Telehealth: Payer: Self-pay | Admitting: *Deleted

## 2020-09-13 NOTE — Telephone Encounter (Signed)
Transition Care Management Follow-up Telephone Call  Date of discharge and from where: 09/12/2020 - [name]  How have you been since you were released from the hospital? "Much better"  Any questions or concerns? No  Items Reviewed:  Did the pt receive and understand the discharge instructions provided? Yes   Medications obtained and verified? Yes   Other? No   Any new allergies since your discharge? No   Dietary orders reviewed? No  Do you have support at home? Yes     Functional Questionnaire: (I = Independent and D = Dependent) ADLs: I  Bathing/Dressing- I  Meal Prep- I  Eating- I  Maintaining continence- I  Transferring/Ambulation- I  Managing Meds- I  Follow up appointments reviewed:   PCP Hospital f/u appt confirmed? No    Specialist Hospital f/u appt confirmed? No    Are transportation arrangements needed? No   If their condition worsens, is the pt aware to call PCP or go to the Emergency Dept.? Yes  Was the patient provided with contact information for the PCP's office or ED? Yes  Was to pt encouraged to call back with questions or concerns? Yes

## 2020-09-14 ENCOUNTER — Ambulatory Visit: Payer: Medicaid Other | Admitting: Allergy and Immunology

## 2020-09-14 ENCOUNTER — Encounter: Payer: Self-pay | Admitting: Allergy and Immunology

## 2020-09-14 ENCOUNTER — Other Ambulatory Visit: Payer: Self-pay

## 2020-09-14 VITALS — BP 118/82 | HR 70 | Temp 98.5°F | Resp 20 | Ht 64.0 in | Wt 234.0 lb

## 2020-09-14 DIAGNOSIS — J3089 Other allergic rhinitis: Secondary | ICD-10-CM

## 2020-09-14 DIAGNOSIS — J4550 Severe persistent asthma, uncomplicated: Secondary | ICD-10-CM | POA: Diagnosis not present

## 2020-09-14 DIAGNOSIS — K219 Gastro-esophageal reflux disease without esophagitis: Secondary | ICD-10-CM

## 2020-09-14 NOTE — Progress Notes (Signed)
[place]   Follow-up Note  Referring Provider: [name], MD Primary Provider: [name], MD Date of Office Visit: 09/14/2020  Subjective:   Kelsey Bullock (DOB: 1961-02-18) is a 60 y.o. female who returns to the [facility] on 09/14/2020 in re-evaluation of the following:  HPI: Bay returns to this clinic in reevaluation of asthma and allergic rhinitis and history of suspected obesity hypoventilation syndrome and history of reflux.  Her last visit to this clinic was 16 March 2020.   She has really done well with her asthma and has not required a systemic steroid to treat an exacerbation and rarely uses any short acting bronchodilator.  She has done so well that she has slowly tapered off her Dulera.  She still continues to use omalizumab and montelukast on a consistent basis.  Her improvement regarding asthma control certainly appears to correlate with her drop in body weight.  She is now 236 pounds.  Her nose has really been doing well while using nasal fluticasone on a pretty consistent basis.  Her reflux is under very good control at this point in time.  She has had 3 Pfizer COVID vaccines and has also sustained a rather significant Covid pneumonitis in winter 2020 fortunately without any long-term sequela.  Allergies as of 09/14/2020      Reactions   Amoxicillin Anaphylaxis, Swelling   Etodolac Anaphylaxis, Shortness Of Breath    lips swelled   Penicillins Anaphylaxis, Swelling   DID THE REACTION INVOLVE: Swelling of the face/tongue/throat, SOB, or low BP? Yes Sudden or severe rash/hives, skin peeling, or the inside of the mouth or nose? No Did it require medical treatment? No When did it last happen?20 years ago If all above answers are "NO", may proceed with cephalosporin use.   Ampicillin Swelling   Augmentin [amoxicillin-pot Clavulanate] Swelling   Diclofenac Sodium    Unknown  reaction    Pantoprazole Sodium    unspecified   Adhesive [tape] Rash      Medication List      acetaminophen 500 MG tablet Commonly known as: TYLENOL Take 1,000 mg by mouth 2 (two) times daily as needed for moderate pain or headache.   albuterol 108 (90 Base) MCG/ACT inhaler Commonly known as: VENTOLIN HFA Inhale 2 puffs into the lungs every 4 (four) hours as needed.   albuterol (2.5 MG/3ML) 0.083% nebulizer solution Commonly known as: PROVENTIL Take 3 mLs (2.5 mg total) by nebulization every 4 (four) hours as needed. USE 1 VIAL VIA NEBULIZER EVERY 4 TO 6 HOURS AS NEEDED FOR COUGH OR WHEEZING   atorvastatin 40 MG tablet Commonly known as: LIPITOR TAKE 1 TABLET(40 MG) BY MOUTH AT BEDTIME   B-12 PO Take by mouth.   B-6 PO Take by mouth.   B12 FOLATE PO Take by mouth.   Bepotastine Besilate 1.5 % Soln INSTILL 1 DROP IN BOTH EYES TWICE DAILY   calcium carbonate 500 MG chewable tablet Commonly known as: TUMS - dosed in mg elemental calcium Chew 2-3 tablets by mouth daily as needed for indigestion or heartburn.   cetirizine 10 MG tablet Commonly known as: ZYRTEC TAKE 1 TABLET(10 MG) BY MOUTH DAILY   CINNAMON PO Take 1 tablet by mouth daily as needed (for high bp).   clonazePAM 0.5 MG tablet Commonly known as: KLONOPIN Take 1 tablet (0.5 mg total) by mouth at bedtime.   COLLAGEN PO Take by mouth.  Cranberry 1000 MG Caps Take 1,000 mg by mouth daily.   cyclobenzaprine 10 MG tablet Commonly known as: FLEXERIL TAKE 1 TABLET BY MOUTH AT BEDTIME AS NEEDED FOR BACK OR NECK PAIN OR SPASMS   D3 VITAMIN PO Take by mouth.   Dulera 200-5 MCG/ACT Aero Generic drug: mometasone-formoterol Inhale 2 puffs into the lungs 2 (two) times daily.   EPINEPHrine 0.3 mg/0.3 mL Soaj injection Commonly known as: EpiPen 2-Pak Inject 0.3 mLs (0.3 mg total) into the muscle once for 1 dose.   famotidine 20 MG tablet Commonly known as: PEPCID Take 1 tablet (20 mg total) by mouth  2 (two) times daily.   fluticasone 50 MCG/ACT nasal spray Commonly known as: FLONASE SHAKE LIQUID AND USE 2 SPRAYS IN EACH NOSTRIL DAILY   ipratropium 0.06 % nasal spray Commonly known as: ATROVENT Use 2 sprays in each nostril every 6 hours if needed to dry up nose   montelukast 10 MG tablet Commonly known as: SINGULAIR TAKE 1 TABLET(10 MG) BY MOUTH AT BEDTIME   mupirocin ointment 2 % Commonly known as: BACTROBAN Apply 1 application topically 2 (two) times daily. Right ear   norethindrone 5 MG tablet Commonly known as: AYGESTIN Take 2.5 mg by mouth at bedtime.   nystatin powder Generic drug: nystatin APPLY TOPICALLY TO THE AFFECTED AREA THREE TIMES DAILY   Pazeo 0.7 % Soln Generic drug: Olopatadine HCl Place 1 drop into both eyes at bedtime.   polyethylene glycol powder 17 GM/SCOOP powder Commonly known as: GLYCOLAX/MIRALAX Take 17 g by mouth 2 (two) times daily as needed.   PROBIOTIC DAILY PO Take by mouth.   triamcinolone 0.025 % ointment Commonly known as: KENALOG Apply topically 2 (two) times daily.   vitamin C 1000 MG tablet Take 1,000 mg by mouth daily.   VITAMIN E PO Take by mouth.   Xolair 150 MG injection Generic drug: omalizumab INJECT 300 MG UNDER THE SKIN EVERY 28 DAYS       Past Medical History:  Diagnosis Date  . Allergic rhinitis   . Anemia   . Anxiety   . Arthritis    knees and back  . Asthma   . Complication of anesthesia   . Congestive heart failure (CHF) ([place])   . Depression   . Gall stone   . GERD (gastroesophageal reflux disease)   . Headache    migraines, stress headaches  . History of kidney stones   . Hypertension   . Hyperthyroidism    during pregnancy  . Pneumonia   . PONV (postoperative nausea and vomiting)   . Recurrent upper respiratory infection (URI)   . Sleep apnea    can't use Cpap    Past Surgical History:  Procedure Laterality Date  . ADENOIDECTOMY    . APPLICATION OF WOUND VAC  03/18/2019  .  APPLICATION OF WOUND VAC N/A 03/18/2019   Procedure: Application Of Wound Vac;  Surgeon: Donnie Mesa, MD;  Location: [place];  Service: General;  Laterality: N/A;  . BIOPSY  05/27/2018   Procedure: BIOPSY;  Surgeon: Mauri Pole, MD;  Location: [facility];  Service: Endoscopy;;  . CESAREAN SECTION    . COLONOSCOPY WITH PROPOFOL N/A 05/27/2018   Procedure: COLONOSCOPY WITH PROPOFOL;  Surgeon: Mauri Pole, MD;  Location: [facility];  Service: Endoscopy;  Laterality: N/A;  . CYST EXCISION    . EXPLORATORY LAPAROTOMY  02/03/2019  . INGUINAL HERNIA REPAIR N/A 02/03/2019   Procedure: EXPLORATORY LAPAROTOMY WITH SMALL BOWEL RESECTION AND  PRIMARY CLOSURE OF VENTRAL HERNIA;  Surgeon: Donnie Mesa, MD;  Location: [place];  Service: General;  Laterality: N/A;  . OVARIAN CYST REMOVAL    . POLYPECTOMY  05/27/2018   Procedure: POLYPECTOMY;  Surgeon: Mauri Pole, MD;  Location: [facility];  Service: Endoscopy;;  . removal of gallstones    . TONSILLECTOMY    . TYMPANOSTOMY TUBE PLACEMENT    . WOUND EXPLORATION  03/18/2019   abdominal  . WOUND EXPLORATION N/A 03/18/2019   Procedure: ABDOMINAL WOUND EXPLORATION;  Surgeon: Donnie Mesa, MD;  Location: [place];  Service: General;  Laterality: N/A;    Review of systems negative except as noted in HPI / PMHx or noted below:  Review of Systems  Constitutional: Negative.   HENT: Negative.   Eyes: Negative.   Respiratory: Negative.   Cardiovascular: Negative.   Gastrointestinal: Negative.   Genitourinary: Negative.   Musculoskeletal: Negative.   Skin: Negative.   Neurological: Negative.   Endo/Heme/Allergies: Negative.   Psychiatric/Behavioral: Negative.      Objective:   Vitals:   09/14/20 0832  BP: 118/82  Pulse: 70  Resp: 20  Temp: 98.5 F (36.9 C)  SpO2: 97%   Height: 5\' 4"  (162.6 cm)  Weight: 234 lb (106.1 kg)   Physical Exam Constitutional:      Appearance: She is not diaphoretic.  HENT:     Head:  Normocephalic.     Right Ear: Tympanic membrane, ear canal and external ear normal.     Left Ear: Tympanic membrane, ear canal and external ear normal.     Nose: Nose normal. No mucosal edema or rhinorrhea.     Mouth/Throat:     Pharynx: Uvula midline. No oropharyngeal exudate.  Eyes:     Conjunctiva/sclera: Conjunctivae normal.  Neck:     Thyroid: No thyromegaly.     Trachea: Trachea normal. No tracheal tenderness or tracheal deviation.  Cardiovascular:     Rate and Rhythm: Normal rate and regular rhythm.     Heart sounds: Normal heart sounds, S1 normal and S2 normal. No murmur heard.   Pulmonary:     Effort: No respiratory distress.     Breath sounds: Normal breath sounds. No stridor. No wheezing or rales.  Lymphadenopathy:     Head:     Right side of head: No tonsillar adenopathy.     Left side of head: No tonsillar adenopathy.     Cervical: No cervical adenopathy.  Skin:    Findings: No erythema or rash.     Nails: There is no clubbing.  Neurological:     Mental Status: She is alert.     Diagnostics:    Spirometry was performed and demonstrated an FEV1 of 2.36 at 90 % of predicted.  The patient had an Asthma Control Test with the following results: ACT Total Score: 21.    Assessment and Plan:   1. Asthma, severe persistent, well-controlled   2. Other allergic rhinitis   3. LPRD (laryngopharyngeal reflux disease)      1. Continue Flonase 1 spray each nostril 1-2 times per day depending on upper airway symptom activity  2. Continue montelukast 10 mg daily  3. Continue Xolair (Epi-Pen)  4.  Can restart Dulera 200 2 inhalations 1-2 times per day depending on asthma activity  5. Can add Qvar 80 Redihaler 2 inhalations twice a day to [facility] when "sick"  6. If needed:   A. Pro-air HFA  B. Antihistamine   C. Famotidine 20 mg daily  E. Nasal ipratropium 0.06% - 2 sprays each nostril every 6 hours  7. Return to clinic in 6 months or earlier if problem  [place]  continues to do very well on her current plan.  She will remain on omalizumab and montelukast and Flonase as anti-inflammatory agents for her airway.  She can always restart her Ruthe Mannan should she develop a problem with increased asthma activity and then she has a "action plan" to use which includes addition of Qvar to [facility] should she become sick.  She has lost a substantial amount of the weight and this has resulted in dramatic improvement regarding her health status especially her respiratory status.  She has a target weight of 150 pounds.  If she continues on her current loss she should be around 200 pounds at the end of the year and then in 2023 she should be able to obtain her target weight of 150 pounds.  Allena Katz, MD Allergy / Immunology [place]

## 2020-09-14 NOTE — Patient Instructions (Addendum)
  1. Continue Flonase 1 spray each nostril 1-2 times per day depending on upper airway symptom activity  2. Continue montelukast 10 mg daily  3. Continue Xolair (Epi-Pen)  4.  Can restart Dulera 200 2 inhalations 1-2 times per day depending on asthma activity  5. Can add Qvar 80 Redihaler 2 inhalations twice a day to [facility] when "sick"  6. If needed:   A. Pro-air HFA  B. Antihistamine   C. Famotidine 20 mg daily  E. Nasal ipratropium 0.06% - 2 sprays each nostril every 6 hours  7. Return to clinic in 6 months or earlier if problem

## 2020-09-15 ENCOUNTER — Encounter: Payer: Self-pay | Admitting: Allergy and Immunology

## 2020-09-15 MED ORDER — FLUTICASONE PROPIONATE 50 MCG/ACT NA SUSP: NASAL | 5 refills | Status: DC

## 2020-09-15 MED ORDER — PAZEO 0.7 % OP SOLN: 1.0000 [drp] | Freq: Every day | OPHTHALMIC | 2 refills | Status: DC

## 2020-09-15 MED ORDER — MONTELUKAST SODIUM 10 MG PO TABS: ORAL_TABLET | ORAL | 3 refills | Status: DC

## 2020-09-15 MED ORDER — FAMOTIDINE 20 MG PO TABS
20.0000 mg | ORAL_TABLET | Freq: Two times a day (BID) | ORAL | 3 refills | Status: DC
Start: 2020-09-15 — End: 2020-10-21

## 2020-09-15 MED ORDER — DULERA 200-5 MCG/ACT IN AERO: 2.0000 | INHALATION_SPRAY | Freq: Two times a day (BID) | RESPIRATORY_TRACT | 5 refills | Status: DC

## 2020-09-15 MED ORDER — IPRATROPIUM BROMIDE 0.06 % NA SOLN: NASAL | 5 refills | Status: DC

## 2020-09-15 MED ORDER — ALBUTEROL SULFATE HFA 108 (90 BASE) MCG/ACT IN AERS: 2.0000 | INHALATION_SPRAY | RESPIRATORY_TRACT | 1 refills | Status: DC | PRN

## 2020-09-15 MED ORDER — EPINEPHRINE 0.3 MG/0.3ML IJ SOAJ: 0.3000 mg | Freq: Once | INTRAMUSCULAR | 2 refills | Status: DC

## 2020-09-16 DIAGNOSIS — Z86010 Personal history of colonic polyps: Secondary | ICD-10-CM | POA: Diagnosis not present

## 2020-09-16 DIAGNOSIS — R109 Unspecified abdominal pain: Secondary | ICD-10-CM | POA: Diagnosis not present

## 2020-09-16 DIAGNOSIS — R634 Abnormal weight loss: Secondary | ICD-10-CM | POA: Diagnosis not present

## 2020-09-16 DIAGNOSIS — R1030 Lower abdominal pain, unspecified: Secondary | ICD-10-CM | POA: Diagnosis not present

## 2020-09-16 DIAGNOSIS — K5900 Constipation, unspecified: Secondary | ICD-10-CM | POA: Diagnosis not present

## 2020-09-28 ENCOUNTER — Ambulatory Visit: Payer: Self-pay | Admitting: *Deleted

## 2020-09-28 ENCOUNTER — Other Ambulatory Visit: Payer: Self-pay

## 2020-09-28 DIAGNOSIS — J4540 Moderate persistent asthma, uncomplicated: Secondary | ICD-10-CM | POA: Diagnosis not present

## 2020-09-29 DIAGNOSIS — H0102B Squamous blepharitis left eye, upper and lower eyelids: Secondary | ICD-10-CM | POA: Diagnosis not present

## 2020-09-29 DIAGNOSIS — D492 Neoplasm of unspecified behavior of bone, soft tissue, and skin: Secondary | ICD-10-CM | POA: Diagnosis not present

## 2020-09-29 DIAGNOSIS — H1045 Other chronic allergic conjunctivitis: Secondary | ICD-10-CM | POA: Diagnosis not present

## 2020-09-29 DIAGNOSIS — H0102A Squamous blepharitis right eye, upper and lower eyelids: Secondary | ICD-10-CM | POA: Diagnosis not present

## 2020-10-05 DIAGNOSIS — D259 Leiomyoma of uterus, unspecified: Secondary | ICD-10-CM | POA: Diagnosis not present

## 2020-10-05 DIAGNOSIS — K439 Ventral hernia without obstruction or gangrene: Secondary | ICD-10-CM | POA: Diagnosis not present

## 2020-10-06 ENCOUNTER — Telehealth: Payer: Self-pay

## 2020-10-06 DIAGNOSIS — D492 Neoplasm of unspecified behavior of bone, soft tissue, and skin: Secondary | ICD-10-CM | POA: Diagnosis not present

## 2020-10-06 DIAGNOSIS — D2239 Melanocytic nevi of other parts of face: Secondary | ICD-10-CM | POA: Diagnosis not present

## 2020-10-06 NOTE — Telephone Encounter (Signed)
Accessed results.  Multiple incidental findings, nothing worrisome.  Called patient and reassured.  We will go over in detail on visit of 5/19.

## 2020-10-06 NOTE — Telephone Encounter (Signed)
Patient calls nurse line stating she had a CT performed at [facility] and would like for PCP to take a look. Patient reports Osborne Oman was not very helpful in explaining "things." We can see the report in care everywhere. Patient scheduled with PCP on 5/19 to further discuss.

## 2020-10-09 IMAGING — DX DG CHEST 1V PORT
2 series · 2 of 2 positions shown · non-contrast
Comparison: 02/06/2019

CLINICAL DATA: Febrile

EXAM:
PORTABLE CHEST 1 VIEW

[chest ap (1 of 2)]
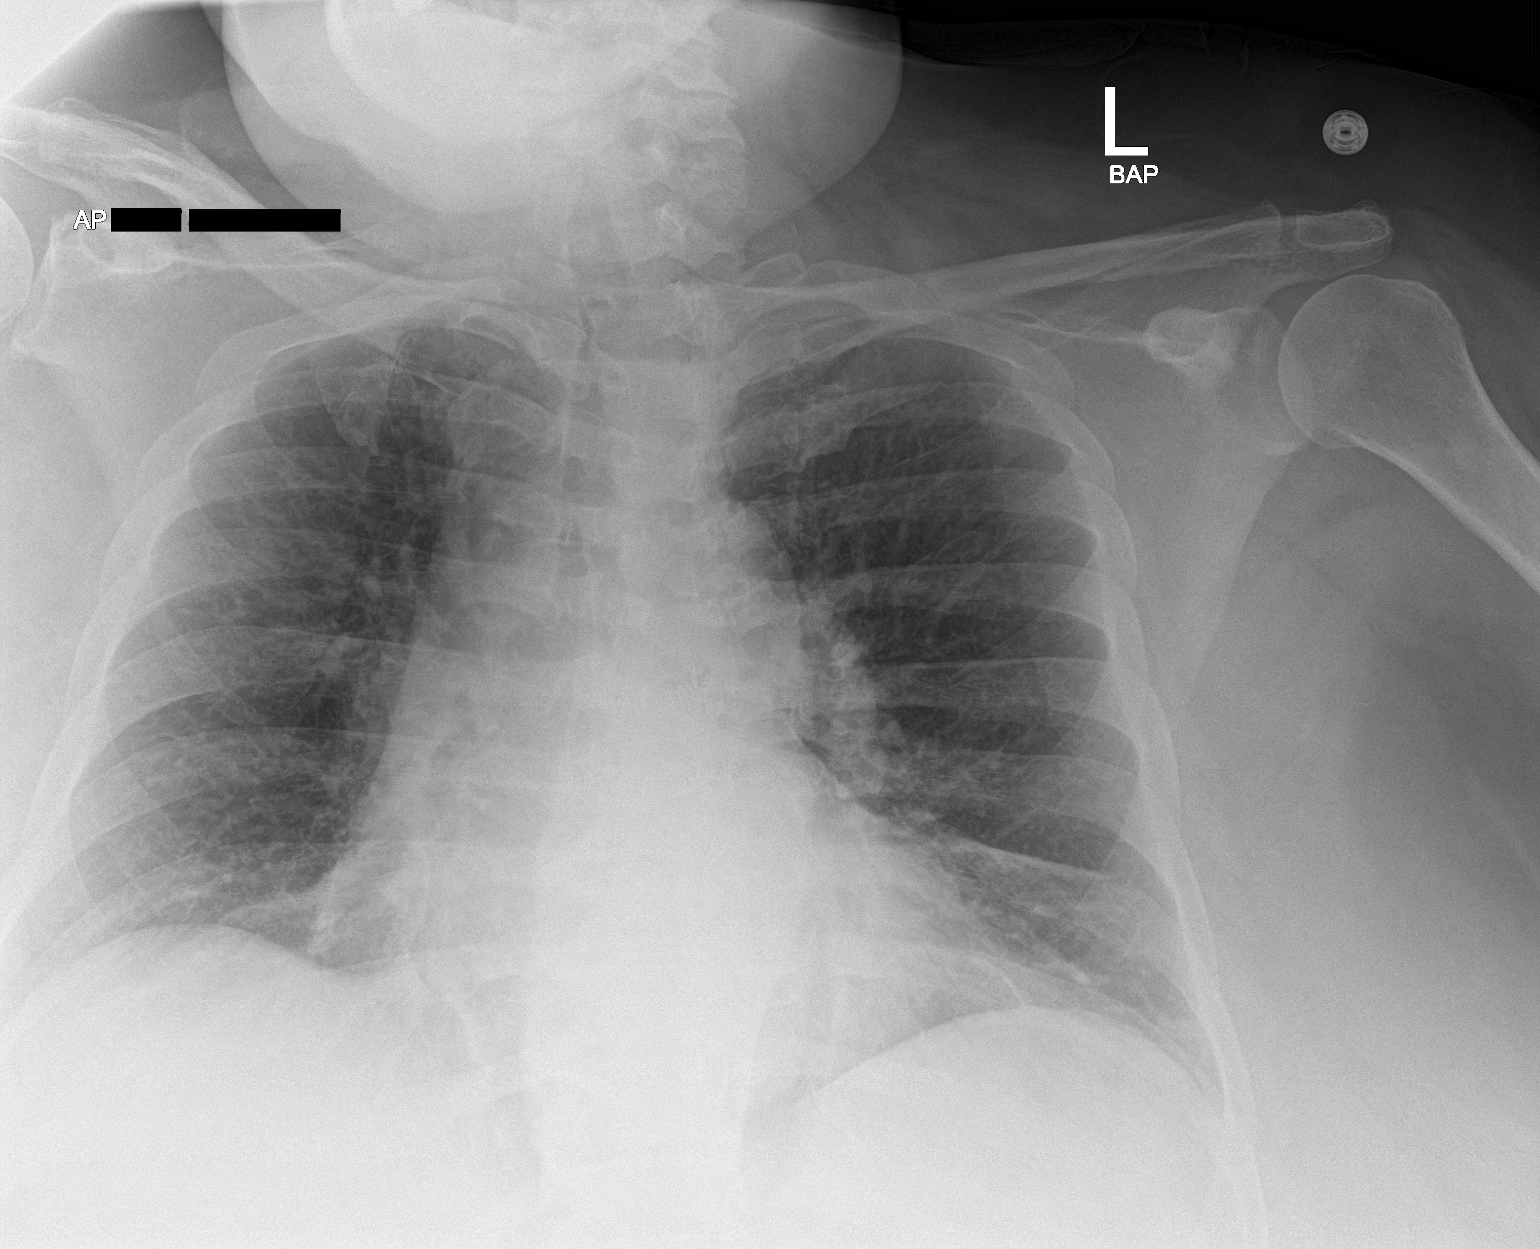

[chest ap (2 of 2)]
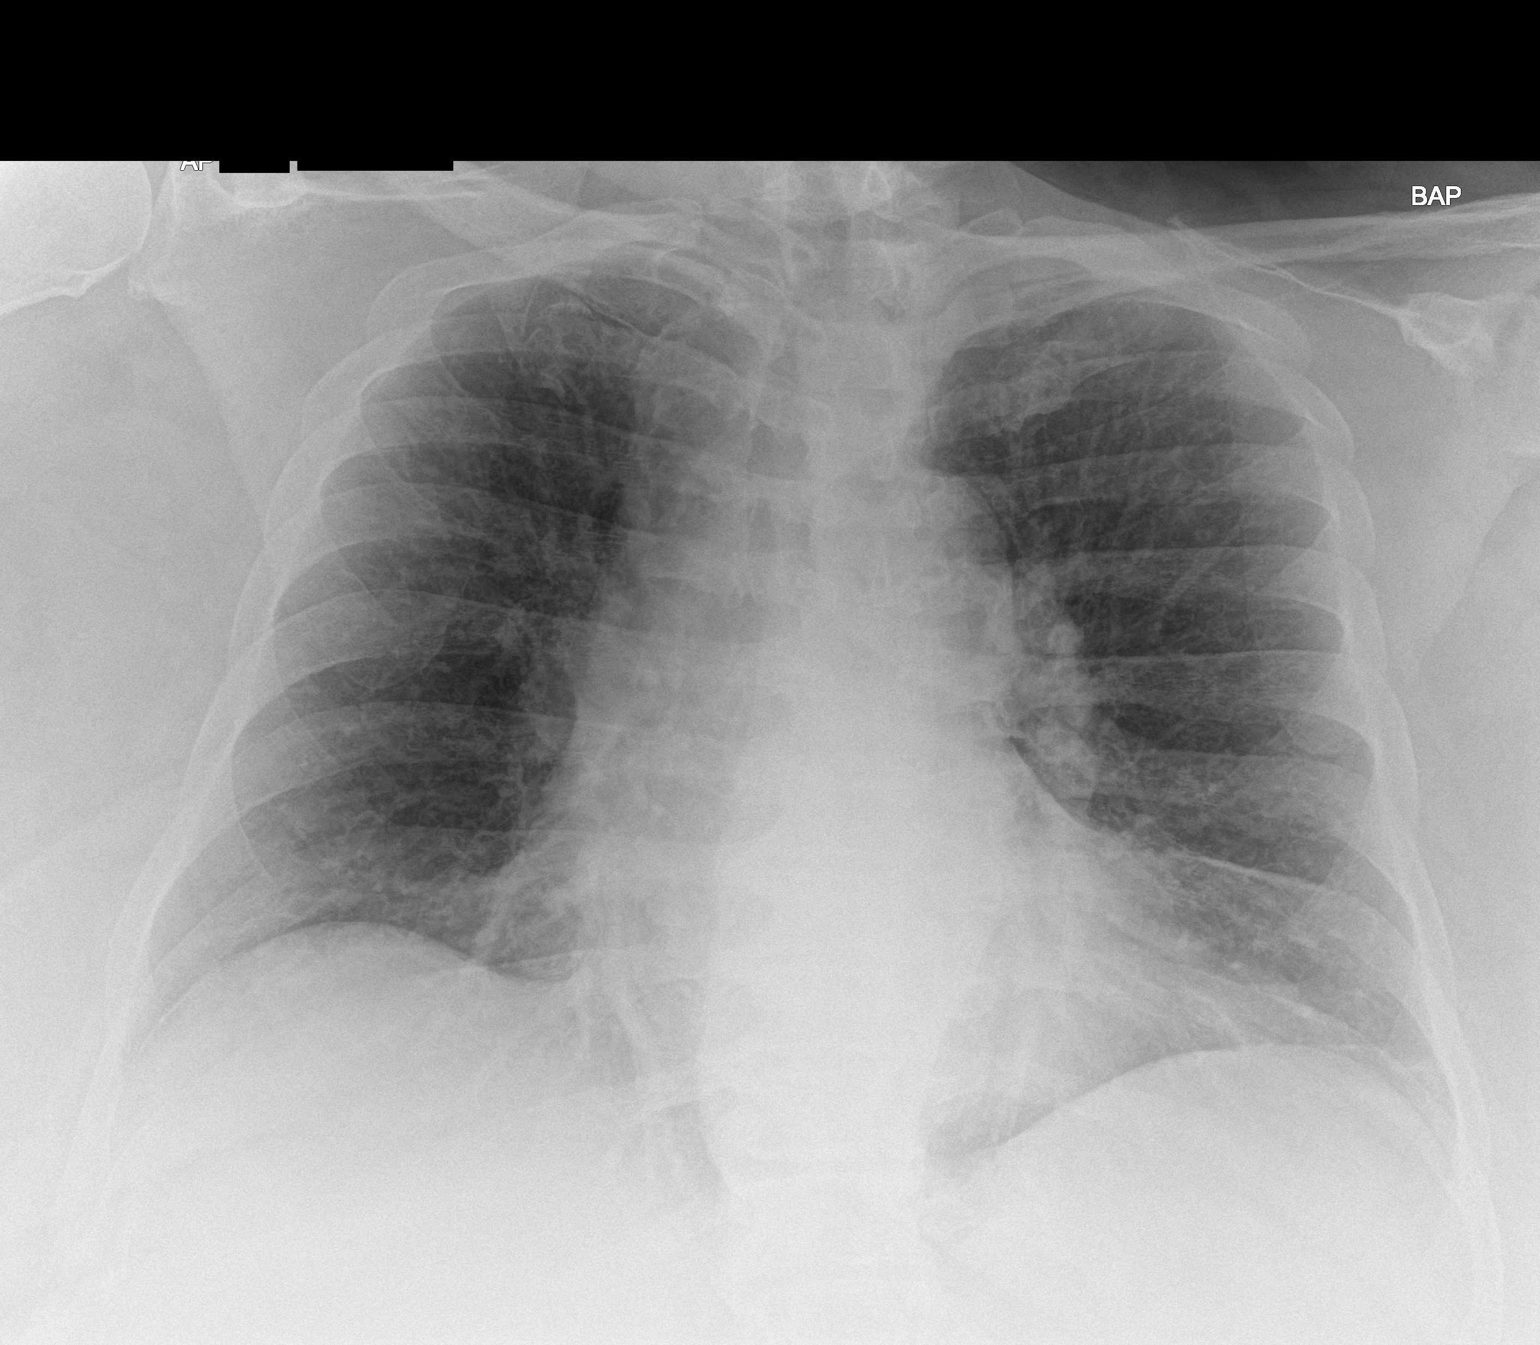

[2 of 2 positions shown; findings below may reference images not displayed]

FINDINGS: Cardiomegaly. Both lungs are clear. The visualized skeletal
structures are unremarkable.
IMPRESSION: Cardiomegaly without acute abnormality of the lungs in AP portable
projection.

## 2020-10-11 IMAGING — DX DG CHEST 1V PORT
1 series · 1 of 1 positions shown · non-contrast
Comparison: 03/21/2019

CLINICAL DATA: Hypoxia, COVID positive

EXAM:
PORTABLE CHEST 1 VIEW

[chest]
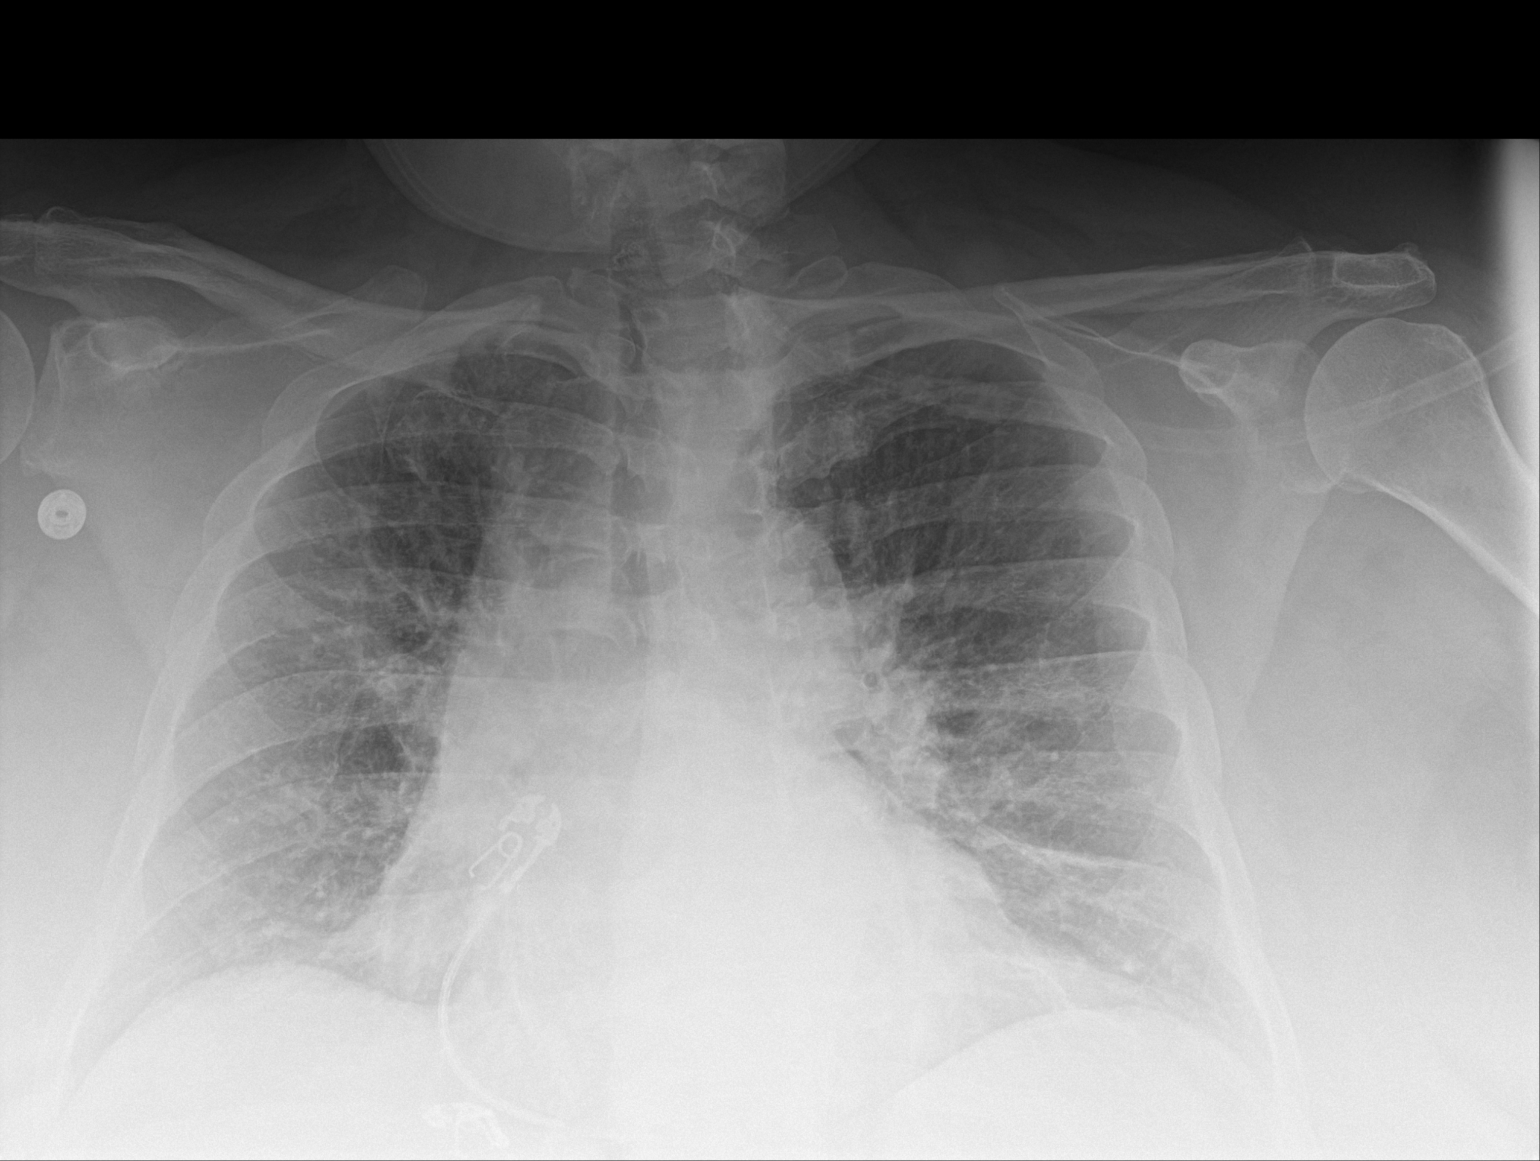

[1 of 1 positions shown; findings below may reference images not displayed]

FINDINGS: Mild linear/patchy opacities in the right upper lobe, left upper
lobe/lingula, and left lower lobe. No pleural effusion or
pneumothorax.

Cardiomegaly.
IMPRESSION: Mild scattered patchy opacities, compatible with pneumonia in this
patient with known COVID.

## 2020-10-14 DIAGNOSIS — R109 Unspecified abdominal pain: Secondary | ICD-10-CM | POA: Diagnosis not present

## 2020-10-14 DIAGNOSIS — K439 Ventral hernia without obstruction or gangrene: Secondary | ICD-10-CM | POA: Diagnosis not present

## 2020-10-15 DIAGNOSIS — Z86010 Personal history of colonic polyps: Secondary | ICD-10-CM | POA: Diagnosis not present

## 2020-10-15 DIAGNOSIS — Z8371 Family history of colonic polyps: Secondary | ICD-10-CM | POA: Diagnosis not present

## 2020-10-15 DIAGNOSIS — K219 Gastro-esophageal reflux disease without esophagitis: Secondary | ICD-10-CM | POA: Diagnosis not present

## 2020-10-15 DIAGNOSIS — R634 Abnormal weight loss: Secondary | ICD-10-CM | POA: Diagnosis not present

## 2020-10-15 DIAGNOSIS — R1032 Left lower quadrant pain: Secondary | ICD-10-CM | POA: Diagnosis not present

## 2020-10-15 DIAGNOSIS — K5900 Constipation, unspecified: Secondary | ICD-10-CM | POA: Diagnosis not present

## 2020-10-15 DIAGNOSIS — R110 Nausea: Secondary | ICD-10-CM | POA: Diagnosis not present

## 2020-10-21 ENCOUNTER — Ambulatory Visit (INDEPENDENT_AMBULATORY_CARE_PROVIDER_SITE_OTHER): Payer: Medicaid Other | Admitting: Family Medicine

## 2020-10-21 ENCOUNTER — Encounter: Payer: Self-pay | Admitting: Family Medicine

## 2020-10-21 ENCOUNTER — Other Ambulatory Visit: Payer: Self-pay

## 2020-10-21 DIAGNOSIS — K5900 Constipation, unspecified: Secondary | ICD-10-CM | POA: Diagnosis not present

## 2020-10-21 DIAGNOSIS — K439 Ventral hernia without obstruction or gangrene: Secondary | ICD-10-CM

## 2020-10-21 DIAGNOSIS — Z79899 Other long term (current) drug therapy: Secondary | ICD-10-CM | POA: Diagnosis not present

## 2020-10-21 NOTE — Assessment & Plan Note (Signed)
Encouraged continued diet and exercise.  She has done a remarkable job thus far.

## 2020-10-21 NOTE — Progress Notes (Signed)
    SUBJECTIVE:   CHIEF COMPLAINT / HPI:   Multiple issues: 1. Concern for Abd pain and abnormal CT scan.  Patient seen by GI at [facility] there included CT abd which showed: Small renal cyst.  Explained that common benign finding. Ventral hernia: She thought she had a sac full of feces.  Educated.  Has appointment with gen surg. Large colon feces: already on linzess and miralax. 2. Voluntary weight loss has plateaued.  She hopes to get more exercise this summer. Despite loss of over 100lbs, still qualifies as morbid obesity. 3. Polypharm.  Brought in meds and did extensive med review.   4. HPDP, I need copy of latest pap.  She gets elsewhere.    OBJECTIVE:   BP 118/64   Pulse 78   Ht 5\' 4"  (1.626 m)   Wt 235 lb (106.6 kg)   LMP 02/03/2019 Comment: Tubal Ligation  SpO2 97%   BMI 40.34 kg/m   Lungs clear Cardiac RRR without m or g Abd benign  ASSESSMENT/PLAN:   Constipation IBS-C.  On appropriate meds.  She will slightly increase miralax.  FU with GI.  Morbid obesity ([place]) Encouraged continued diet and exercise.  She has done a remarkable job thus far.  Ventral hernia Keep appointment with gen surg.    Polypharmacy She is spending a fortune on supplements.  I labeled her meds on her AVS as Optional, Needed and Intermediate.  She will try to slowly stop all most of her optional meds.     [name], MD [place]

## 2020-10-21 NOTE — Assessment & Plan Note (Signed)
IBS-C.  On appropriate meds.  She will slightly increase miralax.  FU with GI.

## 2020-10-21 NOTE — Assessment & Plan Note (Signed)
Keep appointment with gen surg.

## 2020-10-21 NOTE — Assessment & Plan Note (Signed)
She is spending a fortune on supplements.  I labeled her meds on her AVS as Optional, Needed and Intermediate.  She will try to slowly stop all most of her optional meds.

## 2020-10-21 NOTE — Patient Instructions (Addendum)
Please have your gynecologist send me a copy of your latest pap smear.   On this paper, I have listed medicines as N=necessary, O=optional, I= kinda both.

## 2020-10-26 ENCOUNTER — Ambulatory Visit (INDEPENDENT_AMBULATORY_CARE_PROVIDER_SITE_OTHER): Payer: Medicaid Other | Admitting: *Deleted

## 2020-10-26 ENCOUNTER — Other Ambulatory Visit: Payer: Self-pay

## 2020-10-26 DIAGNOSIS — J4540 Moderate persistent asthma, uncomplicated: Secondary | ICD-10-CM | POA: Diagnosis not present

## 2020-10-26 MED ORDER — OMALIZUMAB 150 MG ~~LOC~~ SOLR
300.0000 mg | SUBCUTANEOUS | Status: DC
  Administered 2020-10-26 – 2023-02-07 (×30): 300 mg via SUBCUTANEOUS

## 2020-11-03 ENCOUNTER — Ambulatory Visit: Payer: Self-pay | Admitting: Surgery

## 2020-11-03 DIAGNOSIS — K432 Incisional hernia without obstruction or gangrene: Secondary | ICD-10-CM | POA: Diagnosis not present

## 2020-11-03 NOTE — H&P (Signed)
History of Present Illness Kelsey Bullock. Kelsey Chermak MD; 11/03/2020 5:21 PM) The patient is a 60 year old female who presents with an incisional hernia. PCP - Dr. Domenic Schwab  This is a 60 year old female with a BMI of 59 who presented in August 2020 with an incarcerated infraumbilical ventral hernia with small bowel obstruction. Apparently the patient had some type of laparoscopic procedure performed by GYN about 20 years ago. This was converted to an open procedure. She did not realize that she had a hernia until she came to the ER yesterday. She has had a couple of days of distention, nausea, vomiting, and no bowel movements. CT scan showed a large incarcerated umbilical hernia with small bowel obstruction. The fascial defect appears relatively small.   She was admitted to the hospital on 02/02/19. The next morning, she underwent exploratory laparotomy, small bowel resection, and primary repair of the ventral hernia. We resected a large amount of omentum from the hernia sac. We also resected approximately 20 cm of ischemic small bowel. The hernia defect measured about 4 cm across and was closed with #1 Novafil sutures. A drain was placed in the large dead space in the subcutaneous tissue. She progressed slowly and was finally discharged on 02/11/19. The drain was removed prior to discharge.  She was seen on 02/18/19. We opened her wound and evacuated a large subcutaneous wound infection. She has been performing twice a day dressing changes. She still has some drainage. She remains afebrile. She has been having daily bowel movements.  She was reevaluated on 02/24/19. The wound was clean her but remains quite deep, it extends about 10 cm deep and superiorly to the fascia. I cannot visualize the fascia but probing with a cotton swab shows that the fascia seems to be intact. She continues to have persistent intermittent pain in this area. We obtained a CT scan that showed that the infection possibly  extended through the fascia. On 03/18/19, she underwent abdominal wound exploration that showed that the fascia was intact but there was some necrosis. We debrided the fascia. The wound was managed with a VAC sponge. Unfortunately the patient was diagnosed with COVID while in the hospital. She had a stay at [facility] but was finally discharged home on 04/04/19. The wound finally healed. She was released from our care in February of 2021. She to lose significant amounts of weight. She was evaluated by GI due to lower abdominal pain. A CT scan was performed at [facility] that revealed a recurrent ventral hernia. They describe this in the right lower abdominal wall but her umbilicus seems to be off midline towards the right side. There is no sign of bowel obstruction or ischemia.   Problem List/Past Medical Kelsey Key K. 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 Dulera (200-5MCG/ACT Aerosol, Inhalation) Active. Famotidine (20MG  Tablet, Oral) Active. Albuterol Sulfate HFA (108 (90 Base)MCG/ACT Aerosol Soln, Inhalation) Active. Fluticasone Propionate (50MCG/ACT Suspension, Nasal) Active. Montelukast Sodium (10MG  Tablet, Oral) Active. Atorvastatin Calcium (40MG  Tablet, Oral) Active. MiraLax (17GM/SCOOP Powder, Oral)  Active. Stool Softener (100MG  Capsule, Oral) Active. Qvar  (40MCG/ACT Aerosol Soln, Inhalation) Active. Cetirizine HCl (10MG  Tablet, Oral) Active. EPINEPHrine (0.3MG /0.3ML Soln Auto-inj, Injection) Active. Erythromycin (5MG /GM Ointment, Ophthalmic) Active. Famotidine (40MG  Tablet, Oral) Active. Linzess (145MCG Capsule, Oral) Active. Norethindrone Acetate (5MG  Tablet, Oral) Active. Ipratropium Bromide (0.06% Solution, Nasal) Active. Eysuvis (0.25% Suspension, Ophthalmic) Active.  Social History Kelsey Bullock, Bullock; 11/03/2020 1:18 PM) No alcohol use Tobacco use Never smoker.  Family History Kelsey Bullock, Bullock; 11/03/2020 1:18 PM) Heart Disease Father, Mother. Hypertension Mother, Sister. Thyroid problems Sister.  Pregnancy / Birth History Kelsey Bullock, Bullock; 11/03/2020 1:18 PM) Age at menarche 18 years. Gravida 3 Irregular periods Length (months) of breastfeeding 12-24 Maternal age 8-35  Other Problems Kelsey Bullock. Kelsey Waltermire, MD; 11/03/2020 5:21 PM) Gastroesophageal Reflux Disease Hypercholesterolemia    Vitals Kelsey Bullock; 11/03/2020 1:18 PM) 11/03/2020 1:17 PM Weight: 233.38 lb Height: 64in Body Surface Area: 2.09 m Body Mass Index: 40.06 kg/m  Pulse: 89 (Regular)  BP: 130/84(Sitting, Left Arm, Standard)        Physical Exam Kelsey Key K. Kord Monette MD; 11/03/2020 5:22 PM)  The physical exam findings are as follows: Note:Constitutional: WDWN in NAD, conversant, no obvious deformities; resting comfortably She has lost a significant amount of weight since last visit Eyes: Pupils equal, round; sclera anicteric; moist conjunctiva; no lid lag HENT: Oral mucosa moist; good dentition Neck: No masses palpated, trachea midline; no thyromegaly Lungs: CTA bilaterally; normal respiratory effort CV: Regular rate and rhythm; no murmurs; extremities well-perfused with no edema Abd: +bowel sounds, soft, non-tender, no palpable organomegaly; healed infraumbilical incision - moderate-sized ventral  hernia protruding both right and left, but reducible. The defect seems to be below the umbilicus. Musc: Normal gait; no apparent clubbing or cyanosis in extremities Lymphatic: No palpable cervical or axillary lymphadenopathy Skin: Warm, dry; no sign of jaundice Psychiatric - alert and oriented x 4; calm mood and affect    Assessment & Plan Kelsey Key K. 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 The surgical procedure has been discussed with the patient. Potential risks, benefits, alternative treatments, and expected outcomes have been explained. All of the patient's questions at this time have been answered. The likelihood of reaching the patient's treatment goal is good. The patient understand the proposed surgical procedure and wishes to proceed. Note:We will plan an open repair with mesh. I congratulated her on her ongoing weight loss, which should help her recover quicker from the surgery.  Kelsey Bullock. Georgette Dover, MD, [facility] Surgery  General/ Trauma Surgery   11/03/2020 5:24 PM

## 2020-11-03 NOTE — H&P (View-Only) (Signed)
History of Present Illness Kelsey Bullock. Kelsey Fulfer MD; 11/03/2020 5:21 PM) The patient is a 60 year old female who presents with an incisional hernia. PCP - Dr. Domenic Bullock  This is a 60 year old female with a BMI of 15 who presented in August 2020 with an incarcerated infraumbilical ventral hernia with small bowel obstruction. Apparently the patient had some type of laparoscopic procedure performed by GYN about 20 years ago. This was converted to an open procedure. She did not realize that she had a hernia until she came to the ER yesterday. She has had a couple of days of distention, nausea, vomiting, and no bowel movements. CT scan showed a large incarcerated umbilical hernia with small bowel obstruction. The fascial defect appears relatively small.   She was admitted to the hospital on 02/02/19. The next morning, she underwent exploratory laparotomy, small bowel resection, and primary repair of the ventral hernia. We resected a large amount of omentum from the hernia sac. We also resected approximately 20 cm of ischemic small bowel. The hernia defect measured about 4 cm across and was closed with #1 Novafil sutures. A drain was placed in the large dead space in the subcutaneous tissue. She progressed slowly and was finally discharged on 02/11/19. The drain was removed prior to discharge.  She was seen on 02/18/19. We opened her wound and evacuated a large subcutaneous wound infection. She has been performing twice a day dressing changes. She still has some drainage. She remains afebrile. She has been having daily bowel movements.  She was reevaluated on 02/24/19. The wound was clean her but remains quite deep, it extends about 10 cm deep and superiorly to the fascia. I cannot visualize the fascia but probing with a cotton swab shows that the fascia seems to be intact. She continues to have persistent intermittent pain in this area. We obtained a CT scan that showed that the infection possibly  extended through the fascia. On 03/18/19, she underwent abdominal wound exploration that showed that the fascia was intact but there was some necrosis. We debrided the fascia. The wound was managed with a VAC sponge. Unfortunately the patient was diagnosed with COVID while in the hospital. She had a stay at [facility] but was finally discharged home on 04/04/19. The wound finally healed. She was released from our care in February of 2021. She to lose significant amounts of weight. She was evaluated by GI due to lower abdominal pain. A CT scan was performed at [facility] that revealed a recurrent ventral hernia. They describe this in the right lower abdominal wall but her umbilicus seems to be off midline towards the right side. There is no sign of bowel obstruction or ischemia.   Problem List/Past Medical Kelsey Key K. 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 Dulera (200-5MCG/ACT Aerosol, Inhalation) Active. Famotidine (20MG  Tablet, Oral) Active. Albuterol Sulfate HFA (108 (90 Base)MCG/ACT Aerosol Soln, Inhalation) Active. Fluticasone Propionate (50MCG/ACT Suspension, Nasal) Active. Montelukast Sodium (10MG  Tablet, Oral) Active. Atorvastatin Calcium (40MG  Tablet, Oral) Active. MiraLax (17GM/SCOOP Powder, Oral)  Active. Stool Softener (100MG  Capsule, Oral) Active. Qvar  (40MCG/ACT Aerosol Soln, Inhalation) Active. Cetirizine HCl (10MG  Tablet, Oral) Active. EPINEPHrine (0.3MG /0.3ML Soln Auto-inj, Injection) Active. Erythromycin (5MG /GM Ointment, Ophthalmic) Active. Famotidine (40MG  Tablet, Oral) Active. Linzess (145MCG Capsule, Oral) Active. Norethindrone Acetate (5MG  Tablet, Oral) Active. Ipratropium Bromide (0.06% Solution, Nasal) Active. Eysuvis (0.25% Suspension, Ophthalmic) Active.  Social History Kelsey Bullock, CMA; 11/03/2020 1:18 PM) No alcohol use Tobacco use Never smoker.  Family History Kelsey Bullock, CMA; 11/03/2020 1:18 PM) Heart Disease Father, Mother. Hypertension Mother, Sister. Thyroid problems Sister.  Pregnancy / Birth History Kelsey Bullock, CMA; 11/03/2020 1:18 PM) Age at menarche 55 years. Gravida 3 Irregular periods Length (months) of breastfeeding 12-24 Maternal age 57-35  Other Problems Kelsey Bullock. Kelsey Charo, MD; 11/03/2020 5:21 PM) Gastroesophageal Reflux Disease Hypercholesterolemia    Vitals Kelsey Bullock CMA; 11/03/2020 1:18 PM) 11/03/2020 1:17 PM Weight: 233.38 lb Height: 64in Body Surface Area: 2.09 m Body Mass Index: 40.06 kg/m  Pulse: 89 (Regular)  BP: 130/84(Sitting, Left Arm, Standard)        Physical Exam Kelsey Key K. Molly Savarino MD; 11/03/2020 5:22 PM)  The physical exam findings are as follows: Note:Constitutional: WDWN in NAD, conversant, no obvious deformities; resting comfortably She has lost a significant amount of weight since last visit Eyes: Pupils equal, round; sclera anicteric; moist conjunctiva; no lid lag HENT: Oral mucosa moist; good dentition Neck: No masses palpated, trachea midline; no thyromegaly Lungs: CTA bilaterally; normal respiratory effort CV: Regular rate and rhythm; no murmurs; extremities well-perfused with no edema Abd: +bowel sounds, soft, non-tender, no palpable organomegaly; healed infraumbilical incision - moderate-sized ventral  hernia protruding both right and left, but reducible. The defect seems to be below the umbilicus. Musc: Normal gait; no apparent clubbing or cyanosis in extremities Lymphatic: No palpable cervical or axillary lymphadenopathy Skin: Warm, dry; no sign of jaundice Psychiatric - alert and oriented x 4; calm mood and affect    Assessment & Plan Kelsey Key K. 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 The surgical procedure has been discussed with the patient. Potential risks, benefits, alternative treatments, and expected outcomes have been explained. All of the patient's questions at this time have been answered. The likelihood of reaching the patient's treatment goal is good. The patient understand the proposed surgical procedure and wishes to proceed. Note:We will plan an open repair with mesh. I congratulated her on her ongoing weight loss, which should help her recover quicker from the surgery.  Kelsey Bullock. Georgette Dover, MD, [facility] Surgery  General/ Trauma Surgery   11/03/2020 5:24 PM

## 2020-11-04 ENCOUNTER — Encounter: Payer: Self-pay | Admitting: Family Medicine

## 2020-11-16 ENCOUNTER — Other Ambulatory Visit: Payer: Self-pay | Admitting: Family Medicine

## 2020-11-16 ENCOUNTER — Other Ambulatory Visit: Payer: Self-pay | Admitting: Allergy and Immunology

## 2020-11-16 DIAGNOSIS — F411 Generalized anxiety disorder: Secondary | ICD-10-CM

## 2020-11-17 NOTE — Pre-Procedure Instructions (Signed)
Surgical Instructions    Your procedure is scheduled on Thursday June 23rd.   Report to [facility] Main Entrance "A" at 12:30  P.M., then check in with the Admitting office.  Call this number if you have problems the morning of surgery:  [phone]   If you have any questions prior to your surgery date call [phone]: Open Monday-Friday 8am-4pm    Remember:  Do not eat after midnight the night before your surgery  You may drink clear liquids until 11:30 am the morning of your surgery.   Clear liquids allowed are: Water, Non-Citrus Juices (without pulp), Carbonated Beverages, Clear Tea, Black Coffee Only, and Gatorade    Take these medicines the morning of surgery with A SIP OF WATER   cetirizine (ZYRTEC)   famotidine (PEPCID)   fluticasone (FLONASE)  mometasone-formoterol (DULERA)  mupirocin ointment (BACTROBAN)  Bepotastine Besilate 1.5 % SOLN   Take the following medications if needed: acetaminophen (TYLENOL)  albuterol (PROVENTIL)  albuterol (VENTOLIN HFA)  cyclobenzaprine (FLEXERIL)       As of today, STOP taking any Aspirin (unless otherwise instructed by your surgeon) Aleve, Naproxen, Ibuprofen, Motrin, Advil, Goody's, BC's, all herbal medications, fish oil, and all vitamins.          Do not wear jewelry or makeup Do not wear lotions, powders, perfumes, or deodorant. Do not shave 48 hours prior to surgery.   Do not bring valuables to the hospital. DO Not wear nail polish, gel polish, artificial nails, or any other type of covering on natural nails including finger and toenails. If patients have artificial nails, gel coating, etc. that need to be removed by a nail salon please have this removed prior to surgery or surgery may need to be canceled/delayed if the surgeon/ anesthesia feels like the patient is unable to be adequately monitored.             [facility] is not responsible for any belongings or valuables.  Do NOT Smoke (Tobacco/Vaping) or drink Alcohol  24 hours prior to your procedure If you use a CPAP at night, you may bring all equipment for your overnight stay.   Contacts, glasses, dentures or bridgework may not be worn into surgery, please bring cases for these belongings   For patients admitted to the hospital, discharge time will be determined by your treatment team.   Patients discharged the day of surgery will not be allowed to drive home, and someone needs to stay with them for 24 hours.  ONLY 1 SUPPORT PERSON MAY BE PRESENT WHILE YOU ARE IN SURGERY. IF YOU ARE TO BE ADMITTED ONCE YOU ARE IN YOUR ROOM YOU WILL BE ALLOWED TWO (2) VISITORS.  Minor children may have two parents present. Special consideration for safety and communication needs will be reviewed on a case by case basis.  Special instructions:    Oral Hygiene is also important to reduce your risk of infection.  Remember - BRUSH YOUR TEETH THE MORNING OF SURGERY WITH YOUR REGULAR TOOTHPASTE   [facility]- Preparing For Surgery  Before surgery, you can play an important role. Because skin is not sterile, your skin needs to be as free of germs as possible. You can reduce the number of germs on your skin by washing with CHG (chlorahexidine gluconate) Soap before surgery.  CHG is an antiseptic cleaner which kills germs and bonds with the skin to continue killing germs even after washing.     Please do not use if you have an allergy  to CHG or antibacterial soaps. If your skin becomes reddened/irritated stop using the CHG.  Do not shave (including legs and underarms) for at least 48 hours prior to first CHG shower. It is OK to shave your face.  Please follow these instructions carefully.     Shower the NIGHT BEFORE SURGERY and the MORNING OF SURGERY with CHG Soap.   If you chose to wash your hair, wash your hair first as usual with your normal shampoo. After you shampoo, rinse your hair and body thoroughly to remove the shampoo.  Then [employer] and genitals (private parts)  with your normal soap and rinse thoroughly to remove soap.  After that Use CHG Soap as you would any other liquid soap. You can apply CHG directly to the skin and wash gently with a scrungie or a clean washcloth.   Apply the CHG Soap to your body ONLY FROM THE NECK DOWN.  Do not use on open wounds or open sores. Avoid contact with your eyes, ears, mouth and genitals (private parts). Wash Face and genitals (private parts)  with your normal soap.   Wash thoroughly, paying special attention to the area where your surgery will be performed.  Thoroughly rinse your body with warm water from the neck down.  DO NOT shower/wash with your normal soap after using and rinsing off the CHG Soap.  Pat yourself dry with a CLEAN TOWEL.  Wear CLEAN PAJAMAS to bed the night before surgery  Place CLEAN SHEETS on your bed the night before your surgery  DO NOT SLEEP WITH PETS.   Day of Surgery:  Take a shower with CHG soap. Wear Clean/Comfortable clothing the morning of surgery Do not apply any deodorants/lotions.   Remember to brush your teeth WITH YOUR REGULAR TOOTHPASTE.   Please read over the following fact sheets that you were given.

## 2020-11-18 ENCOUNTER — Encounter (HOSPITAL_COMMUNITY)
Admission: RE | Admit: 2020-11-18 | Discharge: 2020-11-18 | Disposition: A | Discharge status: Home / Self Care | Payer: Medicaid Other | Source: Ambulatory Visit | Attending: Surgery | Admitting: Surgery

## 2020-11-18 ENCOUNTER — Other Ambulatory Visit: Payer: Self-pay

## 2020-11-18 ENCOUNTER — Encounter (HOSPITAL_COMMUNITY): Payer: Self-pay

## 2020-11-18 DIAGNOSIS — Z01818 Encounter for other preprocedural examination: Secondary | ICD-10-CM | POA: Diagnosis not present

## 2020-11-18 LAB — BASIC METABOLIC PANEL
Anion gap: 4 — ABNORMAL LOW (ref 5–15)
BUN: 12 mg/dL (ref 6–20)
CO2: 27 mmol/L (ref 22–32)
Calcium: 8.8 mg/dL — ABNORMAL LOW (ref 8.9–10.3)
Chloride: 106 mmol/L (ref 98–111)
Creatinine, Ser: 0.63 mg/dL (ref 0.44–1.00)
GFR, Estimated: >60 mL/min (ref >60)
Glucose, Bld: 97 mg/dL (ref 70–99)
Potassium: 3.8 mmol/L (ref 3.5–5.1)
Sodium: 137 mmol/L (ref 135–145)

## 2020-11-18 LAB — CBC
HCT: 40.4 % (ref 36.0–46.0)
Hemoglobin: 13.4 g/dL (ref 12.0–15.0)
MCH: 30.8 pg (ref 26.0–34.0)
MCHC: 33.2 g/dL (ref 30.0–36.0)
MCV: 92.9 fL (ref 80.0–100.0)
Platelets: 165 10*3/uL (ref 150–400)
RBC: 4.35 MIL/uL (ref 3.87–5.11)
RDW: 13.2 % (ref 11.5–15.5)
WBC: 5 10*3/uL (ref 4.0–10.5)
nRBC: 0 % (ref 0.0–0.2)

## 2020-11-18 NOTE — Pre-Procedure Instructions (Signed)
Surgical Instructions    Your procedure is scheduled on Thursday June 23rd.   Report to [facility] Main Entrance "A" at 12:30  P.M., then check in with the Admitting office.  Call this number if you have problems the morning of surgery:  [phone]   If you have any questions prior to your surgery date call [phone]: Open Monday-Friday 8am-4pm    Remember:  Do not eat after midnight the night before your surgery  You may drink clear liquids until 11:30 am the morning of your surgery.   Clear liquids allowed are: Water, Non-Citrus Juices (without pulp), Carbonated Beverages, Clear Tea, Black Coffee Only, and Gatorade    Take these medicines the morning of surgery with A SIP OF WATER   cetirizine (ZYRTEC)   famotidine (PEPCID)   fluticasone (FLONASE) nasal spray  mometasone-formoterol (DULERA) inhaler  Bepotastine Besilate 1.5 % SOLN eye drops   Take the following medications if needed: acetaminophen (TYLENOL)  albuterol (PROVENTIL) nebulizer  albuterol (VENTOLIN HFA)  Please bring all inhalers with you the day of surgery.  cyclobenzaprine (FLEXERIL) Epinephrine (EPIPEN)   As of today, STOP taking any Aspirin (unless otherwise instructed by your surgeon) Aleve, Naproxen, Ibuprofen, Motrin, Advil, Goody's, BC's, all herbal medications, fish oil, and all vitamins.          Do not wear jewelry or makeup Do not wear lotions, powders, perfumes, or deodorant. Do not shave 48 hours prior to surgery.   Do not bring valuables to the hospital. Do Not wear nail polish, gel polish, artificial nails, or any other type of covering on natural nails including finger and toenails. If patients have artificial nails, gel coating, etc. that need to be removed by a nail salon please have this removed prior to surgery. Surgery may need to be canceled/delayed if the surgeon/ anesthesia feels like the patient is unable to be adequately monitored due to artificial nails, gel coating, etc.              [facility] is not responsible for any belongings or valuables.  Do NOT Smoke (Tobacco/Vaping) or drink Alcohol 24 hours prior to your procedure If you use a CPAP at night, you may bring all equipment for your overnight stay.   Contacts, glasses, dentures or bridgework may not be worn into surgery, please bring cases for these belongings   For patients admitted to the hospital, discharge time will be determined by your treatment team.   Patients discharged the day of surgery will not be allowed to drive home, and someone needs to stay with them for 24 hours.  ONLY 1 SUPPORT PERSON MAY BE PRESENT WHILE YOU ARE IN SURGERY. IF YOU ARE TO BE ADMITTED ONCE YOU ARE IN YOUR ROOM YOU WILL BE ALLOWED TWO (2) VISITORS.  Minor children may have two parents present. Special consideration for safety and communication needs will be reviewed on a case by case basis.  Special instructions:    Oral Hygiene is also important to reduce your risk of infection.  Remember - BRUSH YOUR TEETH THE MORNING OF SURGERY WITH YOUR REGULAR TOOTHPASTE   [facility]- Preparing For Surgery  Before surgery, you can play an important role. Because skin is not sterile, your skin needs to be as free of germs as possible. You can reduce the number of germs on your skin by washing with CHG (chlorahexidine gluconate) Soap before surgery.  CHG is an antiseptic cleaner which kills germs and bonds with the skin to continue killing  germs even after washing.     Please do not use if you have an allergy to CHG or antibacterial soaps. If your skin becomes reddened/irritated stop using the CHG.  Do not shave (including legs and underarms) for at least 48 hours prior to first CHG shower. It is OK to shave your face.  Please follow these instructions carefully.     Shower the NIGHT BEFORE SURGERY and the MORNING OF SURGERY with CHG Soap.   If you chose to wash your hair, wash your hair first as usual with your normal shampoo. After  you shampoo, rinse your hair and body thoroughly to remove the shampoo.  Then [employer] and genitals (private parts) with your normal soap and rinse thoroughly to remove soap.  After that Use CHG Soap as you would any other liquid soap. You can apply CHG directly to the skin and wash gently with a scrungie or a clean washcloth.   Apply the CHG Soap to your body ONLY FROM THE NECK DOWN.  Do not use on open wounds or open sores. Avoid contact with your eyes, ears, mouth and genitals (private parts). Wash Face and genitals (private parts)  with your normal soap.   Wash thoroughly, paying special attention to the area where your surgery will be performed.  Thoroughly rinse your body with warm water from the neck down.  DO NOT shower/wash with your normal soap after using and rinsing off the CHG Soap.  Pat yourself dry with a CLEAN TOWEL.  Wear CLEAN PAJAMAS to bed the night before surgery  Place CLEAN SHEETS on your bed the night before your surgery  DO NOT SLEEP WITH PETS.   Day of Surgery:  Take a shower with CHG soap. Wear Clean/Comfortable clothing the morning of surgery Do not apply any deodorants/lotions.   Remember to brush your teeth WITH YOUR REGULAR TOOTHPASTE.   Please read over the following fact sheets that you were given.

## 2020-11-18 NOTE — Progress Notes (Signed)
PCP: Dr. Madison Hickman Cardiologist:  Denies  EKG: Today CXR: n/a ECHO: 12-11-2016 Stress Test: denies Cardiac Cath: denies  ERAS: clear liquids morning of surgery  Covid appt: 11/23/20 --aware to quarantine  Pt will call surgeon to verify it's okay to take Xolair injection prior to surgery.  Patient denies shortness of breath, fever, cough, and chest pain at PAT appointment.  Patient verbalized understanding of instructions provided today at the PAT appointment.  Patient asked to review instructions at home and day of surgery.

## 2020-11-19 DIAGNOSIS — F32A Depression, unspecified: Secondary | ICD-10-CM | POA: Diagnosis not present

## 2020-11-23 ENCOUNTER — Other Ambulatory Visit: Payer: Self-pay

## 2020-11-23 ENCOUNTER — Other Ambulatory Visit (HOSPITAL_COMMUNITY)
Admission: RE | Admit: 2020-11-23 | Discharge: 2020-11-23 | Disposition: A | Discharge status: Home / Self Care | Payer: Medicaid Other | Source: Ambulatory Visit | Attending: Surgery | Admitting: Surgery

## 2020-11-23 ENCOUNTER — Ambulatory Visit (INDEPENDENT_AMBULATORY_CARE_PROVIDER_SITE_OTHER): Payer: Medicaid Other | Admitting: *Deleted

## 2020-11-23 DIAGNOSIS — Z20822 Contact with and (suspected) exposure to covid-19: Secondary | ICD-10-CM | POA: Insufficient documentation

## 2020-11-23 DIAGNOSIS — J4540 Moderate persistent asthma, uncomplicated: Secondary | ICD-10-CM | POA: Diagnosis not present

## 2020-11-23 DIAGNOSIS — Z01812 Encounter for preprocedural laboratory examination: Secondary | ICD-10-CM | POA: Diagnosis not present

## 2020-11-23 LAB — SARS CORONAVIRUS 2 (TAT 6-24 HRS): SARS Coronavirus 2: NEGATIVE

## 2020-11-25 ENCOUNTER — Ambulatory Visit (HOSPITAL_COMMUNITY): Payer: Medicaid Other | Admitting: Certified Registered"

## 2020-11-25 ENCOUNTER — Other Ambulatory Visit: Payer: Self-pay

## 2020-11-25 ENCOUNTER — Observation Stay (HOSPITAL_COMMUNITY)
Admission: RE | Admit: 2020-11-25 | Discharge: 2020-11-26 | Disposition: A | Discharge status: Home / Self Care | Payer: Medicaid Other | Attending: Surgery | Admitting: Surgery

## 2020-11-25 ENCOUNTER — Encounter (HOSPITAL_COMMUNITY)
Admission: RE | Disposition: A | Discharge status: Home / Self Care | Payer: Self-pay | Source: Home / Self Care | Attending: Surgery

## 2020-11-25 ENCOUNTER — Encounter (HOSPITAL_COMMUNITY): Payer: Self-pay | Admitting: Surgery

## 2020-11-25 DIAGNOSIS — L905 Scar conditions and fibrosis of skin: Secondary | ICD-10-CM | POA: Diagnosis not present

## 2020-11-25 DIAGNOSIS — Z79899 Other long term (current) drug therapy: Secondary | ICD-10-CM | POA: Insufficient documentation

## 2020-11-25 DIAGNOSIS — K219 Gastro-esophageal reflux disease without esophagitis: Secondary | ICD-10-CM | POA: Diagnosis not present

## 2020-11-25 DIAGNOSIS — E7800 Pure hypercholesterolemia, unspecified: Secondary | ICD-10-CM

## 2020-11-25 DIAGNOSIS — K439 Ventral hernia without obstruction or gangrene: Secondary | ICD-10-CM | POA: Diagnosis not present

## 2020-11-25 DIAGNOSIS — I110 Hypertensive heart disease with heart failure: Secondary | ICD-10-CM | POA: Insufficient documentation

## 2020-11-25 DIAGNOSIS — J45909 Unspecified asthma, uncomplicated: Secondary | ICD-10-CM | POA: Diagnosis not present

## 2020-11-25 DIAGNOSIS — M797 Fibromyalgia: Secondary | ICD-10-CM | POA: Diagnosis not present

## 2020-11-25 DIAGNOSIS — I509 Heart failure, unspecified: Secondary | ICD-10-CM | POA: Diagnosis not present

## 2020-11-25 DIAGNOSIS — K432 Incisional hernia without obstruction or gangrene: Secondary | ICD-10-CM | POA: Diagnosis not present

## 2020-11-25 HISTORY — PX: INSERTION OF MESH: SHX5868

## 2020-11-25 HISTORY — PX: VENTRAL HERNIA REPAIR: SHX424

## 2020-11-25 SURGERY — REPAIR, HERNIA, VENTRAL
Anesthesia: General | Site: Abdomen

## 2020-11-25 MED ORDER — ALBUTEROL SULFATE HFA 108 (90 BASE) MCG/ACT IN AERS: 2.0000 | INHALATION_SPRAY | RESPIRATORY_TRACT | Status: DC | PRN

## 2020-11-25 MED ORDER — SUGAMMADEX SODIUM 200 MG/2ML IV SOLN
INTRAVENOUS | Status: DC | PRN
  Administered 2020-11-25: 200 mg via INTRAVENOUS

## 2020-11-25 MED ORDER — CLONAZEPAM 0.5 MG PO TABS
0.5000 mg | ORAL_TABLET | Freq: Every day | ORAL | Status: DC
  Administered 2020-11-25: 0.5 mg via ORAL
  Filled 2020-11-25: qty 1

## 2020-11-25 MED ORDER — ACETAMINOPHEN 500 MG PO TABS
1000.0000 mg | ORAL_TABLET | ORAL | Status: AC
  Administered 2020-11-25: 1000 mg via ORAL
  Filled 2020-11-25: qty 2

## 2020-11-25 MED ORDER — DIPHENHYDRAMINE HCL 50 MG/ML IJ SOLN
INTRAMUSCULAR | Status: DC | PRN
  Administered 2020-11-25: 12.5 mg via INTRAVENOUS

## 2020-11-25 MED ORDER — POTASSIUM CHLORIDE IN NACL 20-0.9 MEQ/L-% IV SOLN
INTRAVENOUS | Status: DC
  Filled 2020-11-25: qty 1000

## 2020-11-25 MED ORDER — DIPHENHYDRAMINE HCL 25 MG PO CAPS: 25.0000 mg | ORAL_CAPSULE | Freq: Four times a day (QID) | ORAL | Status: DC | PRN

## 2020-11-25 MED ORDER — MONTELUKAST SODIUM 10 MG PO TABS
10.0000 mg | ORAL_TABLET | Freq: Every day | ORAL | Status: DC
  Administered 2020-11-25: 10 mg via ORAL
  Filled 2020-11-25: qty 1

## 2020-11-25 MED ORDER — ACETAMINOPHEN 650 MG RE SUPP: 650.0000 mg | Freq: Four times a day (QID) | RECTAL | Status: DC | PRN

## 2020-11-25 MED ORDER — ENOXAPARIN SODIUM 40 MG/0.4ML IJ SOSY
40.0000 mg | PREFILLED_SYRINGE | INTRAMUSCULAR | Status: DC
  Administered 2020-11-26: 40 mg via SUBCUTANEOUS
  Filled 2020-11-25: qty 0.4

## 2020-11-25 MED ORDER — ACETAMINOPHEN 10 MG/ML IV SOLN
INTRAVENOUS | Status: AC
  Filled 2020-11-25: qty 100

## 2020-11-25 MED ORDER — OXYCODONE HCL 5 MG PO TABS
5.0000 mg | ORAL_TABLET | ORAL | Status: DC | PRN
  Administered 2020-11-25 – 2020-11-26 (×2): 10 mg via ORAL
  Filled 2020-11-25 (×2): qty 2

## 2020-11-25 MED ORDER — PROPOFOL 10 MG/ML IV BOLUS
INTRAVENOUS | Status: AC
  Filled 2020-11-25: qty 20

## 2020-11-25 MED ORDER — CHLORHEXIDINE GLUCONATE CLOTH 2 % EX PADS: 6.0000 | MEDICATED_PAD | Freq: Once | CUTANEOUS | Status: DC

## 2020-11-25 MED ORDER — ONDANSETRON HCL 4 MG/2ML IJ SOLN: 4.0000 mg | Freq: Once | INTRAMUSCULAR | Status: DC | PRN

## 2020-11-25 MED ORDER — DEXAMETHASONE SODIUM PHOSPHATE 10 MG/ML IJ SOLN
INTRAMUSCULAR | Status: AC
  Filled 2020-11-25: qty 1

## 2020-11-25 MED ORDER — ONDANSETRON HCL 4 MG/2ML IJ SOLN
INTRAMUSCULAR | Status: AC
  Filled 2020-11-25: qty 2

## 2020-11-25 MED ORDER — VANCOMYCIN HCL 1500 MG/300ML IV SOLN
1500.0000 mg | INTRAVENOUS | Status: AC
  Administered 2020-11-25: 1500 mg via INTRAVENOUS
  Filled 2020-11-25: qty 300

## 2020-11-25 MED ORDER — METOCLOPRAMIDE HCL 5 MG/ML IJ SOLN
INTRAMUSCULAR | Status: DC | PRN
  Administered 2020-11-25: 10 mg via INTRAVENOUS

## 2020-11-25 MED ORDER — LACTATED RINGERS IV SOLN: INTRAVENOUS | Status: DC

## 2020-11-25 MED ORDER — DIPHENHYDRAMINE HCL 50 MG/ML IJ SOLN: 25.0000 mg | Freq: Four times a day (QID) | INTRAMUSCULAR | Status: DC | PRN

## 2020-11-25 MED ORDER — ACETAMINOPHEN 325 MG PO TABS: 650.0000 mg | ORAL_TABLET | Freq: Four times a day (QID) | ORAL | Status: DC | PRN

## 2020-11-25 MED ORDER — HYDROMORPHONE HCL 1 MG/ML IJ SOLN: 1.0000 mg | INTRAMUSCULAR | Status: DC | PRN

## 2020-11-25 MED ORDER — ONDANSETRON HCL 4 MG/2ML IJ SOLN: 4.0000 mg | Freq: Four times a day (QID) | INTRAMUSCULAR | Status: DC | PRN

## 2020-11-25 MED ORDER — FENTANYL CITRATE (PF) 250 MCG/5ML IJ SOLN
INTRAMUSCULAR | Status: AC
  Filled 2020-11-25: qty 5

## 2020-11-25 MED ORDER — ACETAMINOPHEN 10 MG/ML IV SOLN
1000.0000 mg | Freq: Once | INTRAVENOUS | Status: AC
  Administered 2020-11-25: 1000 mg via INTRAVENOUS

## 2020-11-25 MED ORDER — OXYCODONE HCL 5 MG/5ML PO SOLN: 5.0000 mg | Freq: Once | ORAL | Status: DC | PRN

## 2020-11-25 MED ORDER — LORATADINE 10 MG PO TABS
10.0000 mg | ORAL_TABLET | Freq: Every day | ORAL | Status: DC
  Administered 2020-11-25 – 2020-11-26 (×2): 10 mg via ORAL
  Filled 2020-11-25 (×2): qty 1

## 2020-11-25 MED ORDER — BUPIVACAINE-EPINEPHRINE 0.25% -1:200000 IJ SOLN
INTRAMUSCULAR | Status: DC | PRN
  Administered 2020-11-25: 10 mL

## 2020-11-25 MED ORDER — ORAL CARE MOUTH RINSE: 15.0000 mL | Freq: Once | OROMUCOSAL | Status: AC

## 2020-11-25 MED ORDER — LINACLOTIDE 72 MCG PO CAPS
144.0000 ug | ORAL_CAPSULE | Freq: Every day | ORAL | Status: DC
  Administered 2020-11-26: 144 ug via ORAL
  Filled 2020-11-25 (×3): qty 2

## 2020-11-25 MED ORDER — DOCUSATE SODIUM 100 MG PO CAPS
100.0000 mg | ORAL_CAPSULE | Freq: Two times a day (BID) | ORAL | Status: DC
  Administered 2020-11-25 – 2020-11-26 (×2): 100 mg via ORAL
  Filled 2020-11-25 (×2): qty 1

## 2020-11-25 MED ORDER — FENTANYL CITRATE (PF) 250 MCG/5ML IJ SOLN
INTRAMUSCULAR | Status: DC | PRN
  Administered 2020-11-25 (×2): 50 ug via INTRAVENOUS

## 2020-11-25 MED ORDER — FAMOTIDINE 20 MG PO TABS
40.0000 mg | ORAL_TABLET | Freq: Every day | ORAL | Status: DC
  Administered 2020-11-25 – 2020-11-26 (×2): 40 mg via ORAL
  Filled 2020-11-25 (×2): qty 2

## 2020-11-25 MED ORDER — MIDAZOLAM HCL 2 MG/2ML IJ SOLN
INTRAMUSCULAR | Status: DC | PRN
  Administered 2020-11-25: 2 mg via INTRAVENOUS

## 2020-11-25 MED ORDER — AMISULPRIDE (ANTIEMETIC) 5 MG/2ML IV SOLN: 10.0000 mg | Freq: Once | INTRAVENOUS | Status: DC | PRN

## 2020-11-25 MED ORDER — EPINEPHRINE 0.3 MG/0.3ML IJ SOAJ
0.3000 mg | Freq: Once | INTRAMUSCULAR | Status: DC | PRN
  Filled 2020-11-25: qty 0.6

## 2020-11-25 MED ORDER — ALBUTEROL SULFATE (2.5 MG/3ML) 0.083% IN NEBU: 2.5000 mg | INHALATION_SOLUTION | RESPIRATORY_TRACT | Status: DC | PRN

## 2020-11-25 MED ORDER — ATORVASTATIN CALCIUM 40 MG PO TABS
40.0000 mg | ORAL_TABLET | Freq: Every day | ORAL | Status: DC
  Administered 2020-11-25: 40 mg via ORAL
  Filled 2020-11-25: qty 1

## 2020-11-25 MED ORDER — ONDANSETRON HCL 4 MG/2ML IJ SOLN
INTRAMUSCULAR | Status: DC | PRN
  Administered 2020-11-25: 4 mg via INTRAVENOUS

## 2020-11-25 MED ORDER — DEXAMETHASONE SODIUM PHOSPHATE 10 MG/ML IJ SOLN
INTRAMUSCULAR | Status: DC | PRN
  Administered 2020-11-25: 10 mg via INTRAVENOUS

## 2020-11-25 MED ORDER — LOTEPREDNOL ETABONATE 0.25 % OP SUSP: 1.0000 [drp] | Freq: Four times a day (QID) | OPHTHALMIC | Status: DC

## 2020-11-25 MED ORDER — OXYCODONE HCL 5 MG PO TABS: 5.0000 mg | ORAL_TABLET | Freq: Once | ORAL | Status: DC | PRN

## 2020-11-25 MED ORDER — LIDOCAINE 2% (20 MG/ML) 5 ML SYRINGE
INTRAMUSCULAR | Status: DC | PRN
  Administered 2020-11-25: 100 mg via INTRAVENOUS

## 2020-11-25 MED ORDER — BUPIVACAINE-EPINEPHRINE (PF) 0.25% -1:200000 IJ SOLN
INTRAMUSCULAR | Status: AC
  Filled 2020-11-25: qty 30

## 2020-11-25 MED ORDER — FLUTICASONE PROPIONATE 50 MCG/ACT NA SUSP
2.0000 | Freq: Every day | NASAL | Status: DC
  Administered 2020-11-26: 2 via NASAL
  Filled 2020-11-25: qty 16

## 2020-11-25 MED ORDER — OLOPATADINE HCL 0.7 % OP SOLN: 1.0000 [drp] | Freq: Every day | OPHTHALMIC | Status: DC

## 2020-11-25 MED ORDER — ROCURONIUM BROMIDE 10 MG/ML (PF) SYRINGE
PREFILLED_SYRINGE | INTRAVENOUS | Status: DC | PRN
  Administered 2020-11-25: 60 mg via INTRAVENOUS

## 2020-11-25 MED ORDER — MIDAZOLAM HCL 2 MG/2ML IJ SOLN
INTRAMUSCULAR | Status: AC
  Filled 2020-11-25: qty 2

## 2020-11-25 MED ORDER — KETOROLAC TROMETHAMINE 30 MG/ML IJ SOLN
INTRAMUSCULAR | Status: AC
  Filled 2020-11-25: qty 1

## 2020-11-25 MED ORDER — CHLORHEXIDINE GLUCONATE 0.12 % MT SOLN
15.0000 mL | Freq: Once | OROMUCOSAL | Status: AC
  Administered 2020-11-25: 15 mL via OROMUCOSAL
  Filled 2020-11-25: qty 15

## 2020-11-25 MED ORDER — FENTANYL CITRATE (PF) 100 MCG/2ML IJ SOLN
INTRAMUSCULAR | Status: AC
  Filled 2020-11-25: qty 2

## 2020-11-25 MED ORDER — ROCURONIUM BROMIDE 10 MG/ML (PF) SYRINGE
PREFILLED_SYRINGE | INTRAVENOUS | Status: AC
  Filled 2020-11-25: qty 10

## 2020-11-25 MED ORDER — LIDOCAINE 2% (20 MG/ML) 5 ML SYRINGE
INTRAMUSCULAR | Status: AC
  Filled 2020-11-25: qty 5

## 2020-11-25 MED ORDER — KETOROLAC TROMETHAMINE 30 MG/ML IJ SOLN
30.0000 mg | Freq: Once | INTRAMUSCULAR | Status: AC
  Administered 2020-11-25: 30 mg via INTRAVENOUS

## 2020-11-25 MED ORDER — TRAMADOL HCL 50 MG PO TABS: 50.0000 mg | ORAL_TABLET | Freq: Four times a day (QID) | ORAL | Status: DC | PRN

## 2020-11-25 MED ORDER — METOCLOPRAMIDE HCL 5 MG/ML IJ SOLN
INTRAMUSCULAR | Status: AC
  Filled 2020-11-25: qty 2

## 2020-11-25 MED ORDER — ONDANSETRON 4 MG PO TBDP: 4.0000 mg | ORAL_TABLET | Freq: Four times a day (QID) | ORAL | Status: DC | PRN

## 2020-11-25 MED ORDER — OLOPATADINE HCL 0.1 % OP SOLN
1.0000 [drp] | Freq: Two times a day (BID) | OPHTHALMIC | Status: DC
  Administered 2020-11-25 – 2020-11-26 (×2): 1 [drp] via OPHTHALMIC
  Filled 2020-11-25: qty 5

## 2020-11-25 MED ORDER — DIPHENHYDRAMINE HCL 50 MG/ML IJ SOLN
INTRAMUSCULAR | Status: AC
  Filled 2020-11-25: qty 1

## 2020-11-25 MED ORDER — 0.9 % SODIUM CHLORIDE (POUR BTL) OPTIME
TOPICAL | Status: DC | PRN
  Administered 2020-11-25: 1000 mL

## 2020-11-25 MED ORDER — NORETHINDRONE ACETATE 5 MG PO TABS
2.5000 mg | ORAL_TABLET | Freq: Every day | ORAL | Status: DC
  Administered 2020-11-25: 2.5 mg via ORAL
  Filled 2020-11-25 (×3): qty 1

## 2020-11-25 MED ORDER — MUPIROCIN 2 % EX OINT
1.0000 "application " | TOPICAL_OINTMENT | Freq: Two times a day (BID) | CUTANEOUS | Status: DC
  Administered 2020-11-25 – 2020-11-26 (×2): 1 via TOPICAL
  Filled 2020-11-25: qty 22

## 2020-11-25 MED ORDER — PROPOFOL 10 MG/ML IV BOLUS
INTRAVENOUS | Status: DC | PRN
  Administered 2020-11-25: 200 mg via INTRAVENOUS

## 2020-11-25 MED ORDER — FENTANYL CITRATE (PF) 100 MCG/2ML IJ SOLN
25.0000 ug | INTRAMUSCULAR | Status: DC | PRN
  Administered 2020-11-25 (×3): 50 ug via INTRAVENOUS

## 2020-11-25 MED ORDER — MOMETASONE FURO-FORMOTEROL FUM 200-5 MCG/ACT IN AERO
2.0000 | INHALATION_SPRAY | Freq: Two times a day (BID) | RESPIRATORY_TRACT | Status: DC
  Administered 2020-11-25 – 2020-11-26 (×2): 2 via RESPIRATORY_TRACT
  Filled 2020-11-25: qty 8.8

## 2020-11-25 MED ORDER — ERYTHROMYCIN 5 MG/GM OP OINT
1.0000 "application " | TOPICAL_OINTMENT | Freq: Every day | OPHTHALMIC | Status: DC
  Administered 2020-11-25 – 2020-11-26 (×2): 1 via OPHTHALMIC
  Filled 2020-11-25: qty 3.5

## 2020-11-25 SURGICAL SUPPLY — 40 items
APL PRP STRL LF DISP 70% ISPRP (MISCELLANEOUS)
APL SKNCLS STERI-STRIP NONHPOA (GAUZE/BANDAGES/DRESSINGS) ×1
BENZOIN TINCTURE PRP APPL 2/3 (GAUZE/BANDAGES/DRESSINGS) ×2 IMPLANT
BINDER ABDOMINAL 10 UNV 27-48 (MISCELLANEOUS) ×1 IMPLANT
BINDER ABDOMINAL 12 ML 46-62 (SOFTGOODS) ×2 IMPLANT
BLADE CLIPPER SURG (BLADE) IMPLANT
CANISTER SUCT 3000ML PPV (MISCELLANEOUS) ×2 IMPLANT
CHLORAPREP W/TINT 26 (MISCELLANEOUS) IMPLANT
COVER SURGICAL LIGHT HANDLE (MISCELLANEOUS) ×2 IMPLANT
COVER WAND RF STERILE (DRAPES) ×2 IMPLANT
DRAPE LAPAROSCOPIC ABDOMINAL (DRAPES) ×2 IMPLANT
DRSG TEGADERM 4X4.75 (GAUZE/BANDAGES/DRESSINGS) ×2 IMPLANT
ELECT CAUTERY BLADE 6.4 (BLADE) ×2 IMPLANT
ELECT REM PT RETURN 9FT ADLT (ELECTROSURGICAL) ×2
ELECTRODE REM PT RTRN 9FT ADLT (ELECTROSURGICAL) ×1 IMPLANT
GAUZE SPONGE 4X4 12PLY STRL (GAUZE/BANDAGES/DRESSINGS) ×2 IMPLANT
GLOVE SURG ENC MOIS LTX SZ7 (GLOVE) ×2 IMPLANT
GLOVE SURG UNDER POLY LF SZ7.5 (GLOVE) ×2 IMPLANT
GOWN STRL REUS W/ TWL LRG LVL3 (GOWN DISPOSABLE) ×2 IMPLANT
GOWN STRL REUS W/TWL LRG LVL3 (GOWN DISPOSABLE) ×4
KIT BASIN OR (CUSTOM PROCEDURE TRAY) ×2 IMPLANT
KIT TURNOVER KIT B (KITS) ×2 IMPLANT
MARKER SKIN DUAL TIP RULER LAB (MISCELLANEOUS) ×1 IMPLANT
MESH VENTRALIGHT ST 4X6IN (Mesh General) ×1 IMPLANT
NDL HYPO 25GX1X1/2 BEV (NEEDLE) ×1 IMPLANT
NEEDLE HYPO 25GX1X1/2 BEV (NEEDLE) ×2 IMPLANT
NS IRRIG 1000ML POUR BTL (IV SOLUTION) ×2 IMPLANT
PACK GENERAL/GYN (CUSTOM PROCEDURE TRAY) ×2 IMPLANT
PAD ARMBOARD 7.5X6 YLW CONV (MISCELLANEOUS) ×4 IMPLANT
PENCIL SMOKE EVACUATOR (MISCELLANEOUS) ×2 IMPLANT
STRIP CLOSURE SKIN 1/2X4 (GAUZE/BANDAGES/DRESSINGS) ×2 IMPLANT
SUT MNCRL AB 4-0 PS2 18 (SUTURE) ×2 IMPLANT
SUT NOVA NAB DX-16 0-1 5-0 T12 (SUTURE) ×6 IMPLANT
SUT NOVA NAB GS-21 0 18 T12 DT (SUTURE) IMPLANT
SUT VIC AB 3-0 SH 27 (SUTURE) ×2
SUT VIC AB 3-0 SH 27XBRD (SUTURE) ×1 IMPLANT
SYR CONTROL 10ML LL (SYRINGE) ×2 IMPLANT
TOWEL GREEN STERILE (TOWEL DISPOSABLE) ×2 IMPLANT
TOWEL GREEN STERILE FF (TOWEL DISPOSABLE) ×2 IMPLANT
TRAY FOLEY MTR SLVR 14FR STAT (SET/KITS/TRAYS/PACK) IMPLANT

## 2020-11-25 NOTE — Anesthesia Procedure Notes (Signed)
Procedure Name: Intubation Date/Time: 11/25/2020 3:01 PM Performed by: Kathryne Hitch, CRNA Pre-anesthesia Checklist: Patient identified, Emergency Drugs available, Suction available and Patient being monitored Patient Re-evaluated:Patient Re-evaluated prior to induction Oxygen Delivery Method: Circle system utilized Preoxygenation: Pre-oxygenation with 100% oxygen Induction Type: IV induction Ventilation: Mask ventilation without difficulty Laryngoscope Size: Mac and 3 Grade View: Grade I Tube type: Oral Tube size: 7.0 mm Number of attempts: 1 Airway Equipment and Method: Stylet and Oral airway Placement Confirmation: ETT inserted through vocal cords under direct vision, positive ETCO2 and breath sounds checked- equal and bilateral Secured at: 21 cm Tube secured with: Tape Dental Injury: Teeth and Oropharynx as per pre-operative assessment

## 2020-11-25 NOTE — Transfer of Care (Signed)
Immediate Anesthesia Transfer of Care Note  Patient: Kelsey Bullock  Procedure(s) Performed: RECURRENT VENTRAL HERNIA REPAIR WITH MESH (Abdomen) INSERTION OF MESH (Abdomen)  Patient Location: PACU  Anesthesia Type:General  Level of Consciousness: drowsy and patient cooperative  Airway & Oxygen Therapy: Patient Spontanous Breathing and Patient connected to face mask oxygen  Post-op Assessment: Report given to RN and Post -op Vital signs reviewed and stable  Post vital signs: Reviewed and stable  Last Vitals:  Vitals Value Taken Time  BP 123/65 11/25/20 1630  Temp    Pulse 73 11/25/20 1630  Resp 26 11/25/20 1630  SpO2 100 % 11/25/20 1630  Vitals shown include unvalidated device data.  Last Pain:  Vitals:   11/25/20 1304  TempSrc:   PainSc: 5       Patients Stated Pain Goal: 3 (97/84/78 4128)  Complications: No notable events documented.

## 2020-11-25 NOTE — Progress Notes (Signed)
Made MD aware of little relief with pain medication new orders received

## 2020-11-25 NOTE — Op Note (Signed)
Ventral Hernia Repair with Mesh Procedure Note  Indications: This is a 60 year old female with a BMI of 79 who presented in August 2020 with an incarcerated infraumbilical ventral hernia with small bowel obstruction.  Apparently the patient had some type of laparoscopic procedure performed by GYN about 20 years ago.  This was converted to an open procedure.  She did not realize that she had a hernia until she came to the ER yesterday.  She has had a couple of days of distention, nausea, vomiting, and no bowel movements.  CT scan showed a large incarcerated umbilical hernia with small bowel obstruction.  The fascial defect appears relatively small.     She was admitted to the hospital on 02/02/19.  The next morning, she underwent exploratory laparotomy, small bowel resection, and primary repair of the ventral hernia.  We resected a large amount of omentum from the hernia sac.  We also resected approximately 20 cm of ischemic small bowel.  The hernia defect measured about 4 cm across and was closed with #1 Novafil sutures.  A drain was placed in the large dead space in the subcutaneous tissue.  She progressed slowly and was finally discharged on 02/11/19.  The drain was removed prior to discharge.   She was seen on 02/18/19.  We opened her wound and evacuated a large subcutaneous wound infection.  She has been performing twice a day dressing changes.  She still has some drainage.  She remains afebrile. She has been having daily bowel movements.   She was reevaluated on 02/24/19.  The wound was clean her but remains quite deep, it extends about 10 cm deep and superiorly to the fascia.  I cannot visualize the fascia but probing with a cotton swab shows that the fascia seems to be intact.  She continues to have persistent intermittent pain in this area.  We obtained a CT scan that showed that the infection possibly extended through the fascia.  On 03/18/19, she underwent abdominal wound exploration that showed that  the fascia was intact but there was some necrosis.  We debrided the fascia.  The wound was managed with a VAC sponge.  Unfortunately the patient was diagnosed with COVID while in the hospital.  She had a stay at [facility] but was finally discharged home on 04/04/19.  The wound finally healed.  She was released from our care in February of 2021.   She to lose significant amounts of weight.  She was evaluated by GI due to lower abdominal pain.  A CT scan was performed at [facility] that revealed a recurrent ventral hernia.  They describe this in the right lower abdominal wall but her umbilicus seems to be off midline towards the right side.  There is no sign of bowel obstruction or ischemia.    Pre-operative Diagnosis: Recurrent ventral hernia  Post-operative Diagnosis: Recurrent ventral hernia  Surgeon: Maia Petties   Assistants: Pryor Curia, RNFA  Anesthesia: General endotracheal anesthesia  ASA Class: 2  Procedure Details  The patient was seen in the Holding Room. The risks, benefits, complications, treatment options, and expected outcomes were discussed with the patient. The possibilities of reaction to medication, pulmonary aspiration, perforation of viscus, bleeding, recurrent infection, the need for additional procedures, failure to diagnose a condition, and creating a complication requiring transfusion or operation were discussed with the patient. The patient concurred with the proposed plan, giving informed consent.  The site of surgery properly noted/marked. The patient was taken to the operating  room, identified as YECENIA DALGLEISH and the procedure verified as ventral hernia repair. A Time Out was held and the above information confirmed.  The patient was placed supine.  After establishing general anesthesia, a Foley catheter was placed under sterile technique.  The abdomen was prepped with Chloraprep and draped in sterile fashion.  We made a vertical incision through her  old incision below the umbilicus and excised the old scar. Dissection was carried down to the fascia.  We carefully entered the peritoneal cavity.  There were some flimsy adhesions of the small bowel to the abdominal wall.  The patient has a 4 cm hernia defect to the left of midline.  We connected our fascial opening to the hernia defect, resulting in a 8 x 5 cm fascial defect.  Intact fascia was identified circumferentially around the defect.  We used a 15 x 10 cm Ventralight ST mesh and secured this to the fascia with interrupted 1 Novofil sutures.  The fascial defect was reapproximated with interrupted figure-of-8 1 Novofil sutures.  The subcutaneous tissues were irrigated.  The skin incision was closed with skin staples.  A dry dressing was applied.  She was extubated and brought to the recovery room in stable condition.  Instrument, sponge, and needle counts were correct prior to closure and at the conclusion of the case.   Findings: 4 cm defect left of midline  Estimated Blood Loss:  less than 50 mL         Drains: none                      Complications:  None; patient tolerated the procedure well.         Disposition: PACU - hemodynamically stable.         Condition: stable  Imogene Burn. Georgette Dover, MD, [facility]  General/ Trauma Surgery   11/25/2020 4:33 PM

## 2020-11-25 NOTE — Interval H&P Note (Signed)
History and Physical Interval Note:  11/25/2020 12:31 PM  Kelsey Bullock  has presented today for surgery, with the diagnosis of RECURRENT VENTRAL HERNIA.  The various methods of treatment have been discussed with the patient and family. After consideration of risks, benefits and other options for treatment, the patient has consented to  Procedure(s): [place] (N/A) as a surgical intervention.  The patient's history has been reviewed, patient examined, no change in status, stable for surgery.  I have reviewed the patient's chart and labs.  Questions were answered to the patient's satisfaction.     Maia Petties

## 2020-11-25 NOTE — Anesthesia Preprocedure Evaluation (Signed)
Anesthesia Evaluation  Patient identified by MRN, date of birth, ID band Patient awake    Reviewed: Allergy & Precautions, NPO status , Patient's Chart, lab work & pertinent test results  History of Anesthesia Complications Negative for: history of anesthetic complications  Airway Mallampati: II  TM Distance: >3 FB Neck ROM: Full    Dental  (+) Teeth Intact, Dental Advisory Given   Pulmonary asthma , sleep apnea ,    Pulmonary exam normal        Cardiovascular hypertension, +CHF  Normal cardiovascular exam     Neuro/Psych  Headaches (PSEUDOTUMOR CEREBRI), Anxiety Depression    GI/Hepatic Neg liver ROS, GERD  ,  Endo/Other  negative endocrine ROS  Renal/GU negative Renal ROS  negative genitourinary   Musculoskeletal  (+) Arthritis , Fibromyalgia -  Abdominal   Peds  Hematology negative hematology ROS (+)   Anesthesia Other Findings  Echo 2018: EF 60-65%, g1dd, normal valves  Reproductive/Obstetrics                             Anesthesia Physical Anesthesia Plan  ASA: 3  Anesthesia Plan: General   Post-op Pain Management:    Induction: Intravenous  PONV Risk Score and Plan: 3 and Ondansetron, Dexamethasone, Treatment may vary due to age or medical condition and Midazolam  Airway Management Planned: Oral ETT  Additional Equipment: None  Intra-op Plan:   Post-operative Plan: Extubation in OR  Informed Consent: I have reviewed the patients History and Physical, chart, labs and discussed the procedure including the risks, benefits and alternatives for the proposed anesthesia with the patient or authorized representative who has indicated his/her understanding and acceptance.     Dental advisory given  Plan Discussed with:   Anesthesia Plan Comments:         Anesthesia Quick Evaluation

## 2020-11-26 ENCOUNTER — Encounter (HOSPITAL_COMMUNITY): Payer: Self-pay | Admitting: Surgery

## 2020-11-26 DIAGNOSIS — K432 Incisional hernia without obstruction or gangrene: Secondary | ICD-10-CM | POA: Diagnosis not present

## 2020-11-26 LAB — BASIC METABOLIC PANEL
Anion gap: 9 (ref 5–15)
BUN: 12 mg/dL (ref 6–20)
CO2: 23 mmol/L (ref 22–32)
Calcium: 8.9 mg/dL (ref 8.9–10.3)
Chloride: 103 mmol/L (ref 98–111)
Creatinine, Ser: 0.58 mg/dL (ref 0.44–1.00)
GFR, Estimated: >60 mL/min (ref >60)
Glucose, Bld: 193 mg/dL — ABNORMAL HIGH (ref 70–99)
Potassium: 4.2 mmol/L (ref 3.5–5.1)
Sodium: 135 mmol/L (ref 135–145)

## 2020-11-26 LAB — CBC
HCT: 37.6 % (ref 36.0–46.0)
Hemoglobin: 12.5 g/dL (ref 12.0–15.0)
MCH: 30.4 pg (ref 26.0–34.0)
MCHC: 33.2 g/dL (ref 30.0–36.0)
MCV: 91.5 fL (ref 80.0–100.0)
Platelets: 171 10*3/uL (ref 150–400)
RBC: 4.11 MIL/uL (ref 3.87–5.11)
RDW: 13.1 % (ref 11.5–15.5)
WBC: 9.4 10*3/uL (ref 4.0–10.5)
nRBC: 0 % (ref 0.0–0.2)

## 2020-11-26 MED ORDER — OXYCODONE HCL 5 MG PO TABS: 5.0000 mg | ORAL_TABLET | Freq: Four times a day (QID) | ORAL | 0 refills | Status: DC | PRN

## 2020-11-26 NOTE — Anesthesia Postprocedure Evaluation (Signed)
Anesthesia Post Note  Patient: Kelsey Bullock  Procedure(s) Performed: RECURRENT VENTRAL HERNIA REPAIR WITH MESH (Abdomen) INSERTION OF MESH (Abdomen)     Patient location during evaluation: PACU Anesthesia Type: General Level of consciousness: awake and alert Pain management: pain level controlled Vital Signs Assessment: post-procedure vital signs reviewed and stable Respiratory status: spontaneous breathing, nonlabored ventilation and respiratory function stable Cardiovascular status: blood pressure returned to baseline and stable Postop Assessment: no apparent nausea or vomiting Anesthetic complications: no   No notable events documented.  Last Vitals:  Vitals:   11/26/20 0546 11/26/20 1044  BP: (!) 107/56 (!) 105/39  Pulse: 61 74  Resp: 17 19  Temp: 37.1 C 37.5 C  SpO2: 92% 96%    Last Pain:  Vitals:   11/26/20 1100  TempSrc:   PainSc: 8                  Shamieka Gullo E Nazarene Bunning

## 2020-11-26 NOTE — Progress Notes (Signed)
Patient arrived to 6N15 from PACU. Report received from [place], [place]. Patient alert and oriented x4. Dressing intact with minimal drainage marked. Patient call bell within reach. Will continue to monitor.

## 2020-11-26 NOTE — Progress Notes (Signed)
Discharge instructions given to patient. Instructions on dressing changes provided to patient and son. IV removed. Patient verbalized understanding. Patient discharged

## 2020-11-26 NOTE — Discharge Instructions (Signed)
[facility]      [facility] [phone]  OPEN ABDOMINAL SURGERY: POST OP INSTRUCTIONS  Always review your discharge instruction sheet given to you by the facility where your surgery was performed.  IF YOU HAVE DISABILITY OR FAMILY LEAVE FORMS, YOU MUST BRING THEM TO THE OFFICE FOR PROCESSING.  PLEASE DO NOT GIVE THEM TO YOUR DOCTOR.  A prescription for pain medication may be given to you upon discharge.  Take your pain medication as prescribed, if needed.  If narcotic pain medicine is not needed, then you may take acetaminophen (Tylenol) or ibuprofen (Advil) as needed. Take your usually prescribed medications unless otherwise directed. If you need a refill on your pain medication, please contact your pharmacy. They will contact our office to request authorization.  Prescriptions will not be filled after 5pm or on week-ends. You should follow a light diet the first few days after arrival home, such as soup and crackers, pudding, etc.unless your doctor has advised otherwise. A high-fiber, low fat diet can be resumed as tolerated.   Be sure to include lots of fluids daily. Most patients will experience some swelling and bruising on the chest and neck area.  Ice packs will help.  Swelling and bruising can take several days to resolve Most patients will experience some swelling and bruising in the area of the incision. Ice pack will help. Swelling and bruising can take several days to resolve..  It is common to experience some constipation if taking pain medication after surgery.  Increasing fluid intake and taking a stool softener will usually help or prevent this problem from occurring.  A mild laxative (Milk of Magnesia or Miralax) should be taken according to package directions if there are no bowel movements after 48 hours.  You may have steri-strips (small skin tapes) in place directly over the incision.  These strips should be left on the skin for 7-10 days.  If your surgeon used skin  glue on the incision, you may shower in 24 hours.  The glue will flake off over the next 2-3 weeks.  Any sutures or staples will be removed at the office during your follow-up visit. You may find that a light gauze bandage over your incision may keep your staples from being rubbed or pulled. You may shower and replace the bandage daily. ACTIVITIES:  You may resume regular (light) daily activities beginning the next day--such as daily self-care, walking, climbing stairs--gradually increasing activities as tolerated.  You may have sexual intercourse when it is comfortable.  Refrain from any heavy lifting or straining until approved by your doctor. You may drive when you no longer are taking prescription pain medication, you can comfortably wear a seatbelt, and you can safely maneuver your car and apply brakes Return to Work: ___________________________________ You should see your doctor in the office for a follow-up appointment approximately two weeks after your surgery.  Make sure that you call for this appointment within a day or two after you arrive home to insure a convenient appointment time. OTHER INSTRUCTIONS:  _____________________________________________________________ _____________________________________________________________  WHEN TO CALL YOUR DOCTOR: Fever over 101.0 Inability to urinate Nausea and/or vomiting Extreme swelling or bruising Continued bleeding from incision. Increased pain, redness, or drainage from the incision. Difficulty swallowing or breathing Muscle cramping or spasms. Numbness or tingling in hands or feet or around lips.  The clinic staff is available to answer your questions during regular business hours.  Please don't hesitate to call and ask to speak to one of   the nurses if you have concerns.  For further questions, please visit [email or web address]  

## 2020-11-26 NOTE — Discharge Summary (Signed)
Physician Discharge Summary  Patient ID: Kelsey Bullock MRN: [id number] DOB/AGE: [birth year] 60 y.o.  Admit date: 11/25/2020 Discharge date: 11/26/2020  Admission Diagnoses:  Recurrent ventral incisional hernia  Discharge Diagnoses: Same Active Problems:   Recurrent ventral incisional hernia   Discharged Condition: good  Hospital Course: The patient underwent open repair of her recurrent ventral hernia on 11/25/20.  We used a 10 x 15 cm Ventralight mesh.  Her pain was fairly well controlled overnight and she was eating a solid food diet.  Voiding well.  She feels ready for discharge.  Treatments: surgery: as noted above  Discharge Exam: Blood pressure (!) 107/56, pulse 61, temperature 98.8 F (37.1 C), temperature source Oral, resp. rate 17, height 5\' 4"  (1.626 m), weight 106.6 kg, last menstrual period 02/03/2019, SpO2 92 %. General appearance: alert, cooperative, and no distress GI: abdomen sore, no peritonitis; worse with movement Incision/Wound:  Dressing with some dried drainage Abdominal binder in place  Disposition: Discharge disposition: 01-Home or Self Care       Discharge Instructions     Call MD for:  persistant nausea and vomiting   Complete by: As directed    Call MD for:  redness, tenderness, or signs of infection (pain, swelling, redness, odor or green/yellow discharge around incision site)   Complete by: As directed    Call MD for:  severe uncontrolled pain   Complete by: As directed    Call MD for:  temperature >100.4   Complete by: As directed    Diet general   Complete by: As directed    Discharge wound care:   Complete by: As directed    Beginning 6/25, you may shower.  Change the gauze dressing over the incision after each shower.  Wear your abdominal binder whenever you are out of bed   Driving Restrictions   Complete by: As directed    Do not drive while taking pain medications   Increase activity slowly   Complete by: As directed    May  shower / Bathe   Complete by: As directed       Allergies as of 11/26/2020       Reactions   Amoxicillin Anaphylaxis, Swelling   Etodolac Anaphylaxis, Shortness Of Breath    lips swelled   Penicillins Anaphylaxis, Swelling   DID THE REACTION INVOLVE: Swelling of the face/tongue/throat, SOB, or low BP? Yes Sudden or severe rash/hives, skin peeling, or the inside of the mouth or nose? No Did it require medical treatment? No When did it last happen?      20 years ago If all above answers are "NO", may proceed with cephalosporin use.   Ampicillin Swelling   Augmentin [amoxicillin-pot Clavulanate] Swelling   Diclofenac Sodium    Unknown reaction    Pantoprazole Sodium    unspecified   Adhesive [tape] Rash        Medication List     TAKE these medications    acetaminophen 500 MG tablet Commonly known as: TYLENOL Take 1,000 mg by mouth 2 (two) times daily as needed for moderate pain or headache.   albuterol (2.5 MG/3ML) 0.083% nebulizer solution Commonly known as: PROVENTIL Take 3 mLs (2.5 mg total) by nebulization every 4 (four) hours as needed. USE 1 VIAL VIA NEBULIZER EVERY 4 TO 6 HOURS AS NEEDED FOR COUGH OR WHEEZING What changed:  reasons to take this additional instructions   albuterol 108 (90 Base) MCG/ACT inhaler Commonly known as: VENTOLIN HFA Inhale 2 puffs into the  lungs every 4 (four) hours as needed. What changed: reasons to take this   atorvastatin 40 MG tablet Commonly known as: LIPITOR TAKE 1 TABLET(40 MG) BY MOUTH AT BEDTIME What changed: See the new instructions.   B-6 50 MG Tabs Take 50 mg by mouth daily.   B12 FOLATE PO Take 1 tablet by mouth daily.   Bepotastine Besilate 1.5 % Soln Place 1 drop into both eyes 2 (two) times daily.   calcium carbonate 500 MG chewable tablet Commonly known as: TUMS - dosed in mg elemental calcium Chew 2-3 tablets by mouth daily as needed for indigestion or heartburn.   cetirizine 10 MG tablet Commonly  known as: ZYRTEC TAKE 1 TABLET(10 MG) BY MOUTH DAILY What changed: See the new instructions.   clonazePAM 0.5 MG tablet Commonly known as: KLONOPIN TAKE 1 TABLET(0.5 MG) BY MOUTH AT BEDTIME What changed: See the new instructions.   COLLAGEN PO Take 1 Scoop by mouth daily.   Cranberry 1000 MG Caps Take 1,000 mg by mouth daily.   cyclobenzaprine 10 MG tablet Commonly known as: FLEXERIL TAKE 1 TABLET BY MOUTH AT BEDTIME AS NEEDED FOR BACK OR NECK PAIN OR SPASMS What changed: See the new instructions.   D3 VITAMIN PO Take by mouth.   Dulera 200-5 MCG/ACT Aero Generic drug: mometasone-formoterol Inhale 2 puffs into the lungs 2 (two) times daily.   EPINEPHrine 0.3 mg/0.3 mL Soaj injection Commonly known as: EpiPen 2-Pak Inject 0.3 mg into the muscle once for 1 dose.   erythromycin ophthalmic ointment Place 1 application into both eyes daily.   EYE IRRITATION RELIEF OP Place 1 spray into both eyes daily.   Eysuvis 0.25 % Susp Generic drug: Loteprednol Etabonate Apply 1 drop to eye 4 (four) times daily.   famotidine 40 MG tablet Commonly known as: PEPCID Take 40 mg by mouth daily.   fluticasone 50 MCG/ACT nasal spray Commonly known as: FLONASE SHAKE LIQUID AND USE 2 SPRAYS IN EACH NOSTRIL DAILY What changed:  how much to take how to take this when to take this additional instructions   linaclotide 72 MCG capsule Commonly known as: LINZESS Take 144 mcg by mouth daily before breakfast.   montelukast 10 MG tablet Commonly known as: SINGULAIR TAKE 1 TABLET(10 MG) BY MOUTH AT BEDTIME What changed:  how much to take how to take this additional instructions   mupirocin ointment 2 % Commonly known as: BACTROBAN Apply 1 application topically 2 (two) times daily. Right ear What changed: additional instructions   norethindrone 5 MG tablet Commonly known as: AYGESTIN Take 2.5 mg by mouth at bedtime.   nystatin powder Generic drug: nystatin APPLY TOPICALLY TO  THE AFFECTED AREA THREE TIMES DAILY What changed: See the new instructions.   oxyCODONE 5 MG immediate release tablet Commonly known as: Oxy IR/ROXICODONE Take 1 tablet (5 mg total) by mouth every 6 (six) hours as needed for severe pain.   Pazeo 0.7 % Soln Generic drug: Olopatadine HCl Place 1 drop into both eyes at bedtime.   polyethylene glycol powder 17 GM/SCOOP powder Commonly known as: GLYCOLAX/MIRALAX Take 17 g by mouth 2 (two) times daily as needed. What changed: reasons to take this   PROBIOTIC DAILY PO Take 1 capsule by mouth daily.   triamcinolone 0.025 % ointment Commonly known as: KENALOG Apply topically 2 (two) times daily. What changed:  how much to take when to take this reasons to take this   vitamin C 1000 MG tablet Take 1,000 mg by  mouth daily.   Vitamin E 100 units Tabs Take 100 Units by mouth daily.   Xolair 150 MG injection Generic drug: omalizumab INJECT 300 MG UNDER THE SKIN EVERY 28 DAYS What changed:  how much to take how to take this when to take this additional instructions               Discharge Care Instructions  (From admission, onward)           Start     Ordered   11/26/20 0000  Discharge wound care:       Comments: Beginning 6/25, you may shower.  Change the gauze dressing over the incision after each shower.  Wear your abdominal binder whenever you are out of bed   11/26/20 0800            Follow-up Information     Donnie Mesa, MD Follow up in 2 week(s).   Specialty: General Surgery Contact information: [address] [place] [address] [phone]                 Signed: Maia Petties 11/26/2020, 8:02 AM

## 2020-11-27 ENCOUNTER — Encounter (HOSPITAL_COMMUNITY): Payer: Self-pay | Admitting: Emergency Medicine

## 2020-11-27 ENCOUNTER — Emergency Department (HOSPITAL_COMMUNITY)
Admission: EM | Admit: 2020-11-27 | Discharge: 2020-11-27 | Disposition: A | Discharge status: Home / Self Care | Payer: Medicaid Other | Attending: Emergency Medicine | Admitting: Emergency Medicine

## 2020-11-27 DIAGNOSIS — I110 Hypertensive heart disease with heart failure: Secondary | ICD-10-CM | POA: Diagnosis not present

## 2020-11-27 DIAGNOSIS — J45909 Unspecified asthma, uncomplicated: Secondary | ICD-10-CM | POA: Insufficient documentation

## 2020-11-27 DIAGNOSIS — Z4801 Encounter for change or removal of surgical wound dressing: Secondary | ICD-10-CM | POA: Insufficient documentation

## 2020-11-27 DIAGNOSIS — I5030 Unspecified diastolic (congestive) heart failure: Secondary | ICD-10-CM | POA: Diagnosis not present

## 2020-11-27 DIAGNOSIS — G8918 Other acute postprocedural pain: Secondary | ICD-10-CM | POA: Insufficient documentation

## 2020-11-27 DIAGNOSIS — R1030 Lower abdominal pain, unspecified: Secondary | ICD-10-CM

## 2020-11-27 DIAGNOSIS — Z9049 Acquired absence of other specified parts of digestive tract: Secondary | ICD-10-CM | POA: Insufficient documentation

## 2020-11-27 DIAGNOSIS — Z79899 Other long term (current) drug therapy: Secondary | ICD-10-CM | POA: Insufficient documentation

## 2020-11-27 DIAGNOSIS — Z5189 Encounter for other specified aftercare: Secondary | ICD-10-CM

## 2020-11-27 MED ORDER — ONDANSETRON 4 MG PO TBDP: 4.0000 mg | ORAL_TABLET | ORAL | 0 refills | Status: DC | PRN

## 2020-11-27 MED ORDER — ONDANSETRON 4 MG PO TBDP
4.0000 mg | ORAL_TABLET | Freq: Once | ORAL | Status: AC
  Administered 2020-11-27: 4 mg via ORAL
  Filled 2020-11-27: qty 1

## 2020-11-27 MED ORDER — OXYCODONE-ACETAMINOPHEN 5-325 MG PO TABS
2.0000 | ORAL_TABLET | Freq: Once | ORAL | Status: AC
  Administered 2020-11-27: 2 via ORAL
  Filled 2020-11-27: qty 2

## 2020-11-27 NOTE — ED Provider Notes (Signed)
[facility] EMERGENCY DEPARTMENT Provider Note   CSN: [id number] Arrival date & time: 11/27/20  2130     History No chief complaint on file.   Kelsey Bullock is a 60 y.o. female.  HPI Patient is postop day #3 after ventral hernia repair.  Patient reports that a small portion of the wound has opened up and the staples have released.  She reports there is some drainage.  Patient reports she also has increased general lower abdominal pain.  No vomiting or fever.  Patient reports he did not eat or drink or take her pain medication since before midnight last night in case she needed any revision surgery.  No chest pain or shortness of breath.  No fever no cough.    Past Medical History:  Diagnosis Date   Allergic rhinitis    Anemia    Anxiety    Arthritis    knees and back   Asthma    Complication of anesthesia    Congestive heart failure (CHF) ([place])    Depression    Gall stone    GERD (gastroesophageal reflux disease)    Headache    migraines, stress headaches   History of kidney stones    Hypertension    Hyperthyroidism    during pregnancy   Pneumonia    PONV (postoperative nausea and vomiting)    Recurrent upper respiratory infection (URI)    Sleep apnea    can't use Cpap    Patient Active Problem List   Diagnosis Date Noted   Recurrent ventral incisional hernia 11/25/2020   Ventral hernia 10/21/2020   Polypharmacy 10/21/2020   Dehydration 04/19/2020   Pap smear for cervical cancer screening 02/25/2020   Constipation 03/22/2019   Hx of adenomatous colonic polyps    Pilonidal cyst 86/57/8469   Diastolic CHF with preserved left ventricular function, NYHA class 2 ([place]) 12/14/2016   GERD (gastroesophageal reflux disease) 02/06/2015   Severe persistent asthma 02/06/2015   Depression, major, recurrent ([place]) 02/19/2014   Sleep apnea 03/10/2009   Seborrheic eczema 07/17/2007   LOW BACK PAIN SYNDROME 11/26/2006   HYPERCHOLESTEROLEMIA 08/02/2006    Morbid obesity ([place]) 08/02/2006   Anxiety state 08/02/2006   PSEUDOTUMOR CEREBRI 08/02/2006   OSTEOARTHRITIS, LOWER LEG 08/02/2006   FIBROMYALGIA, FIBROMYOSITIS 08/02/2006    Past Surgical History:  Procedure Laterality Date   ADENOIDECTOMY     APPLICATION OF WOUND VAC  62/95/2841   APPLICATION OF WOUND VAC N/A 03/18/2019   Procedure: Application Of Wound Vac;  Surgeon: Donnie Mesa, MD;  Location: [place];  Service: General;  Laterality: N/A;   BIOPSY  05/27/2018   Procedure: BIOPSY;  Surgeon: Mauri Pole, MD;  Location: [facility];  Service: Endoscopy;;   CESAREAN SECTION     COLONOSCOPY WITH PROPOFOL N/A 05/27/2018   Procedure: COLONOSCOPY WITH PROPOFOL;  Surgeon: Mauri Pole, MD;  Location: [facility];  Service: Endoscopy;  Laterality: N/A;   CYST EXCISION     EXPLORATORY LAPAROTOMY  02/03/2019   INGUINAL HERNIA REPAIR N/A 02/03/2019   Procedure: EXPLORATORY LAPAROTOMY WITH SMALL BOWEL RESECTION AND PRIMARY CLOSURE OF VENTRAL HERNIA;  Surgeon: Donnie Mesa, MD;  Location: [place];  Service: General;  Laterality: N/A;   INSERTION OF MESH N/A 11/25/2020   Procedure: INSERTION OF MESH;  Surgeon: Donnie Mesa, MD;  Location: [place];  Service: General;  Laterality: N/A;   OVARIAN CYST REMOVAL     POLYPECTOMY  05/27/2018   Procedure: POLYPECTOMY;  Surgeon: Silverio Decamp,  Venia Minks, MD;  Location: [facility];  Service: Endoscopy;;   removal of gallstones     TONSILLECTOMY     TYMPANOSTOMY TUBE PLACEMENT     VENTRAL HERNIA REPAIR N/A 11/25/2020   Procedure: RECURRENT VENTRAL HERNIA REPAIR WITH MESH;  Surgeon: Donnie Mesa, MD;  Location: [place];  Service: General;  Laterality: N/A;   WOUND EXPLORATION  03/18/2019   abdominal   WOUND EXPLORATION N/A 03/18/2019   Procedure: ABDOMINAL WOUND EXPLORATION;  Surgeon: Donnie Mesa, MD;  Location: [place];  Service: General;  Laterality: N/A;     OB History   No obstetric history on file.     Family History  Adopted:  Yes  Problem Relation Age of Onset   Diabetes Mother    Diabetes Paternal Uncle    Allergic rhinitis Neg Hx    Angioedema Neg Hx    Asthma Neg Hx    Atopy Neg Hx    Eczema Neg Hx    Immunodeficiency Neg Hx    Urticaria Neg Hx     Social History   Tobacco Use   Smoking status: Never   Smokeless tobacco: Never  Vaping Use   Vaping Use: Never used  Substance Use Topics   Alcohol use: No   Drug use: No    Home Medications Prior to Admission medications   Medication Sig Start Date End Date Taking? Authorizing Provider  ondansetron (ZOFRAN ODT) 4 MG disintegrating tablet Take 1 tablet (4 mg total) by mouth every 4 (four) hours as needed for nausea or vomiting. 11/27/20  Yes Charlesetta Shanks, MD  acetaminophen (TYLENOL) 500 MG tablet Take 1,000 mg by mouth 2 (two) times daily as needed for moderate pain or headache.    [provider]  albuterol (PROVENTIL) (2.5 MG/3ML) 0.083% nebulizer solution Take 3 mLs (2.5 mg total) by nebulization every 4 (four) hours as needed. USE 1 VIAL VIA NEBULIZER EVERY 4 TO 6 HOURS AS NEEDED FOR COUGH OR WHEEZING 05/20/19   Kozlow, Donnamarie Poag, MD  albuterol (VENTOLIN HFA) 108 (90 Base) MCG/ACT inhaler Inhale 2 puffs into the lungs every 4 (four) hours as needed. 09/15/20   Kozlow, Donnamarie Poag, MD  Ascorbic Acid (VITAMIN C) 1000 MG tablet Take 1,000 mg by mouth daily.    [provider]  atorvastatin (LIPITOR) 40 MG tablet TAKE 1 TABLET(40 MG) BY MOUTH AT BEDTIME 06/18/20   [name], MD  Bepotastine Besilate 1.5 % SOLN Place 1 drop into both eyes 2 (two) times daily. 11/11/19   [provider]  calcium carbonate (TUMS - DOSED IN MG ELEMENTAL CALCIUM) 500 MG chewable tablet Chew 2-3 tablets by mouth daily as needed for indigestion or heartburn.    [provider]  cetirizine (ZYRTEC) 10 MG tablet TAKE 1 TABLET(10 MG) BY MOUTH DAILY 11/16/20   Kozlow, Donnamarie Poag, MD  Cholecalciferol (D3 VITAMIN PO) Take by mouth.    [provider]  clonazePAM (KLONOPIN) 0.5 MG tablet TAKE 1 TABLET(0.5 MG) BY MOUTH AT BEDTIME 11/16/20   [name], MD  Cobalamin Combinations (B12 FOLATE PO) Take 1 tablet by mouth daily.    [provider]  COLLAGEN PO Take 1 Scoop by mouth daily.    [provider]  Cranberry 1000 MG CAPS Take 1,000 mg by mouth daily.    [provider]  cyclobenzaprine (FLEXERIL) 10 MG tablet TAKE 1 TABLET BY MOUTH AT BEDTIME AS NEEDED FOR BACK OR NECK PAIN OR SPASMS 06/20/19  [name], MD  EPINEPHrine (EPIPEN 2-PAK) 0.3 mg/0.3 mL IJ SOAJ injection Inject 0.3 mg into the muscle once for 1 dose. 09/15/20 09/15/20  Kozlow, Donnamarie Poag, MD  erythromycin ophthalmic ointment Place 1 application into both eyes daily. 10/06/20   [provider]  EYSUVIS 0.25 % SUSP Apply 1 drop to eye 4 (four) times daily. 11/17/20   [provider]  famotidine (PEPCID) 40 MG tablet Take 40 mg by mouth daily.    [provider]  fluticasone (FLONASE) 50 MCG/ACT nasal spray SHAKE LIQUID AND USE 2 SPRAYS IN EACH NOSTRIL DAILY 09/15/20   Kozlow, Donnamarie Poag, MD  linaclotide [facility]) 72 MCG capsule Take 144 mcg by mouth daily before breakfast. 10/15/20   [provider]  mometasone-formoterol (DULERA) 200-5 MCG/ACT AERO Inhale 2 puffs into the lungs 2 (two) times daily. 09/15/20   Kozlow, Donnamarie Poag, MD  montelukast (SINGULAIR) 10 MG tablet TAKE 1 TABLET(10 MG) BY MOUTH AT BEDTIME 09/15/20   Kozlow, Donnamarie Poag, MD  mupirocin ointment (BACTROBAN) 2 % Apply 1 application topically 2 (two) times daily. Right ear 04/20/20   [name], MD  norethindrone (AYGESTIN) 5 MG tablet Take 2.5 mg by mouth at bedtime. 02/11/19   [provider]  NYSTATIN powder APPLY TOPICALLY TO THE AFFECTED AREA THREE TIMES DAILY 04/14/19   Hensel, [name], MD  Olopatadine HCl (PAZEO) 0.7 % SOLN Place 1 drop into both eyes at bedtime. 09/15/20   Kozlow, Donnamarie Poag, MD  oxyCODONE (OXY IR/ROXICODONE) 5 MG  immediate release tablet Take 1 tablet (5 mg total) by mouth every 6 (six) hours as needed for severe pain. 11/26/20   Donnie Mesa, MD  polyethylene glycol powder (GLYCOLAX/MIRALAX) 17 GM/SCOOP powder Take 17 g by mouth 2 (two) times daily as needed. 09/01/19   [name], MD  Probiotic Product (PROBIOTIC DAILY PO) Take 1 capsule by mouth daily.    [provider]  Pyridoxine HCl (B-6) 50 MG TABS Take 50 mg by mouth daily.    [provider]  Tetrahydroz-Polyvinyl Al-Povid (EYE IRRITATION RELIEF OP) Place 1 spray into both eyes daily.    [provider]  triamcinolone (KENALOG) 0.025 % ointment Apply topically 2 (two) times daily. 05/20/19   Kozlow, Donnamarie Poag, MD  Vitamin E 100 units TABS Take 100 Units by mouth daily.    [provider]  [name] 150 MG injection INJECT 300 MG UNDER THE SKIN EVERY 28 DAYS 04/13/20   Kozlow, Donnamarie Poag, MD    Allergies    Amoxicillin, Etodolac, Penicillins, Ampicillin, Augmentin [amoxicillin-pot clavulanate], Diclofenac sodium, Pantoprazole sodium, and Adhesive [tape]  Review of Systems   Review of Systems 10 systems reviewed and negative except as per HPI Physical Exam Updated Vital Signs BP (!) 114/59   Pulse 74   Temp 99.2 F (37.3 C) (Oral)   Resp 19   LMP 02/03/2019 Comment: Tubal Ligation  SpO2 100%   Physical Exam Constitutional:      Comments: Alert nontoxic clear mental status.  No respiratory distress  HENT:     Head: Normocephalic and atraumatic.     Mouth/Throat:     Pharynx: Oropharynx is clear.  Cardiovascular:     Rate and Rhythm: Normal rate and regular rhythm.  Pulmonary:     Effort: Pulmonary effort is normal.     Breath sounds: Normal breath sounds.  Abdominal:     Comments: Surgical wound as seen in images.  Patient endorses some diffuse tenderness  to palpation of the lower abdomen.  No guarding.  Musculoskeletal:     Comments: Lower extremities are soft and pliable.  No edema.  Skin:     General: Skin is warm and dry.  Neurological:     General: No focal deficit present.     Coordination: Coordination normal.  Psychiatric:        Mood and Affect: Mood normal.        ED Results / Procedures / Treatments   Labs (all labs ordered are listed, but only abnormal results are displayed) Labs Reviewed - No data to display  EKG None  Radiology No results found.  Procedures Procedures   Medications Ordered in ED Medications  oxyCODONE-acetaminophen (PERCOCET/ROXICET) 5-325 MG per tablet 2 tablet (2 tablets Oral Given 11/27/20 1251)  ondansetron (ZOFRAN-ODT) disintegrating tablet 4 mg (4 mg Oral Given 11/27/20 1250)    ED Course  I have reviewed the triage vital signs and the nursing notes.  Pertinent labs & imaging results that were available during my care of the patient were reviewed by me and considered in my medical decision making (see chart for details).  Clinical Course as of 11/27/20 1317  Sat Nov 27, 2020  1140 Consult: Dr. Georgette Dover will have on-call person at [facility] evaluate the wound.  He anticipates local wound care will be the plan [MP]    Clinical Course User Index [MP] Charlesetta Shanks, MD   MDM Rules/Calculators/A&P                         Patient presents for recheck of surgical wound.  At this time there is early dehiscence.  Patient is nontoxic.  No fever.  Stable vital signs.  Patient has been seen by general surgery.  Have reviewed images with Dr. Georgette Dover.  At this time we will proceed with local wound care and close follow-up with surgery.  Final Clinical Impression(s) / ED Diagnoses Final diagnoses:  Visit for wound check  Lower abdominal pain    Rx / DC Orders ED Discharge Orders          Ordered    ondansetron (ZOFRAN ODT) 4 MG disintegrating tablet  Every 4 hours PRN        11/27/20 1307             Charlesetta Shanks, MD 11/27/20 1318

## 2020-11-27 NOTE — Discharge Instructions (Addendum)
1.  Start your dressing changes with dry gauze.  You may have a lot of drainage saturating the gauze.  Change as needed.  Once wound has decreased in amount of drainage, you may start wet-to-dry dressing as described in your wound packing instructions. 2.  Call Dr. Vonna Kotyk office Monday to review how your wound is doing and schedule your recheck.

## 2020-11-27 NOTE — ED Notes (Signed)
Pt verbalizes understanding of discharge instructions. Opportunity for questions and answers were provided. Pt discharged from the ED.   ?

## 2020-11-27 NOTE — ED Triage Notes (Signed)
Pt had hernia repair on 6/23.  States she was discharged from hospital yesterday and believes that her staples started coming out when she picked up her suitcase to leave from hospital.  Pain 10/10.

## 2020-11-27 NOTE — Progress Notes (Signed)
Few prongs of staples fell out of left side of wound.  No real cavity under the skin.  Recommend packing daily.  Still okay to shower - just take dressing off and put dressing back on after shower.  Recommend trying some food here in the ER then discharge home.  Please call office on Monday AM to update Dr. Georgette Dover on how you are doing.

## 2020-11-27 NOTE — ED Notes (Signed)
Pt states she would like to give pain medicine time to work before eating.

## 2020-11-27 NOTE — ED Provider Notes (Signed)
Emergency Medicine Provider Triage Evaluation Note  Kelsey Bullock , a 61 y.o. female  was evaluated in triage.  Pt complains of pain to incision site, had hernia surgery on Wednesday, noticing staples coming out last night, worsening today. No fevers chills, has not taken anything for pain today.  Her daughter is a Marine scientist and took a look at the wound and recommended that she call Dr. Molli Posey  who is her Psychologist, sport and exercise.  Dr. Molli Posey recommends that she come to the ER for evaluation   Review of Systems  Positive: As above  Negative: As above   Physical Exam  LMP 02/03/2019 Comment: Tubal Ligation Gen:   Awake, no distress   Resp:  Normal effort  MSK:   Moves extremities without difficulty  Other:  Linear vertical abdominal incision with a few staples coming loose, no visible dehiscence, no surrounding erythema, warmth, drainage  Medical Decision Making  Medically screening exam initiated at 10:00 AM.  Appropriate orders placed.  Kelsey Bullock was informed that the remainder of the evaluation will be completed by another provider, this initial triage assessment does not replace that evaluation, and the importance of remaining in the ED until their evaluation is complete.     Garald Balding, PA-C 11/27/20 1005    Tegeler, Gwenyth Allegra, MD 11/27/20 [phone]

## 2020-11-29 LAB — SURGICAL PATHOLOGY

## 2020-12-21 ENCOUNTER — Ambulatory Visit (INDEPENDENT_AMBULATORY_CARE_PROVIDER_SITE_OTHER): Payer: Medicaid Other

## 2020-12-21 ENCOUNTER — Other Ambulatory Visit: Payer: Self-pay

## 2020-12-21 DIAGNOSIS — J4540 Moderate persistent asthma, uncomplicated: Secondary | ICD-10-CM

## 2020-12-31 ENCOUNTER — Other Ambulatory Visit: Payer: Self-pay | Admitting: Family Medicine

## 2020-12-31 DIAGNOSIS — Z1231 Encounter for screening mammogram for malignant neoplasm of breast: Secondary | ICD-10-CM

## 2021-01-05 ENCOUNTER — Other Ambulatory Visit: Payer: Self-pay

## 2021-01-05 ENCOUNTER — Ambulatory Visit (INDEPENDENT_AMBULATORY_CARE_PROVIDER_SITE_OTHER): Payer: Medicaid Other | Admitting: Family Medicine

## 2021-01-05 ENCOUNTER — Encounter: Payer: Self-pay | Admitting: Family Medicine

## 2021-01-05 DIAGNOSIS — L304 Erythema intertrigo: Secondary | ICD-10-CM

## 2021-01-05 DIAGNOSIS — L568 Other specified acute skin changes due to ultraviolet radiation: Secondary | ICD-10-CM

## 2021-01-05 MED ORDER — NYSTATIN 100000 UNIT/GM EX POWD: CUTANEOUS | 3 refills | Status: DC

## 2021-01-05 NOTE — Patient Instructions (Addendum)
I sent in a prescription for the rash on your belly - a powder you have used before. For the rash on your arms, please get some sun screen, at least SPF 30.  I think you are right and the reaction is from the sun.   That should take care of it.   See me again in fall for a full shot.\ Get back on that diet.  Don't let your own health slip as you help take care of others.

## 2021-01-06 ENCOUNTER — Encounter: Payer: Self-pay | Admitting: Family Medicine

## 2021-01-06 DIAGNOSIS — L568 Other specified acute skin changes due to ultraviolet radiation: Secondary | ICD-10-CM | POA: Insufficient documentation

## 2021-01-06 NOTE — Assessment & Plan Note (Signed)
Sunscreen.

## 2021-01-06 NOTE — Assessment & Plan Note (Signed)
Nystatin

## 2021-01-06 NOTE — Assessment & Plan Note (Signed)
Urged to get back on diet.  BMI still 41

## 2021-01-06 NOTE — Progress Notes (Signed)
    SUBJECTIVE:   CHIEF COMPLAINT / HPI:   Two different rashes. One on left forearm.  She believes it is worsened by sun exposure.  When she drives, the window is down and her arm is out (no AC in the car.)  Present x 3 weeks.  Stable, not worsening.  No fever or systemic sx. Rash in left groin area under pannus.  Not painful.  Pruritic.  Consistent with other similar rashes she has had in groin and under breasts. Recovering from recent abd hernia repair.  Wound dehissed and is healing by secondary intent. She has recently reconnected with biologic mom and sister.  Mom is dying of cancer.  Sister is dependent and Yury is driving around. Obesity.  She has lost a huge amount of weight.  Plateaued.  She has been off her diet with taxiing her sister.   Emotionally, still upbeat.  "It has been tough."  In a role reversal, she is now the strong one in her family    OBJECTIVE:   BP 136/74   Pulse 97   Ht '5\' 4"'$  (1.626 m)   Wt 242 lb 6.4 oz (110 kg)   LMP 02/03/2019 Comment: Tubal Ligation  SpO2 99%   BMI 41.61 kg/m   Skin, left forearm, non distinct irritation with some excoriation. Left groin, intertrigo. Lungs clear   ASSESSMENT/PLAN:   Intertrigo Nystatin.  Morbid obesity ([place]) Urged to get back on diet.  BMI still 41  Photosensitivity dermatitis Sunscreen.     [name], MD [place]

## 2021-01-18 ENCOUNTER — Ambulatory Visit: Payer: Medicaid Other | Admitting: *Deleted

## 2021-01-18 ENCOUNTER — Other Ambulatory Visit: Payer: Self-pay

## 2021-01-18 DIAGNOSIS — J4540 Moderate persistent asthma, uncomplicated: Secondary | ICD-10-CM

## 2021-01-19 DIAGNOSIS — H47323 Drusen of optic disc, bilateral: Secondary | ICD-10-CM | POA: Diagnosis not present

## 2021-01-19 DIAGNOSIS — H0102A Squamous blepharitis right eye, upper and lower eyelids: Secondary | ICD-10-CM | POA: Diagnosis not present

## 2021-01-19 DIAGNOSIS — L719 Rosacea, unspecified: Secondary | ICD-10-CM | POA: Diagnosis not present

## 2021-01-19 DIAGNOSIS — H40033 Anatomical narrow angle, bilateral: Secondary | ICD-10-CM | POA: Diagnosis not present

## 2021-01-19 DIAGNOSIS — H40023 Open angle with borderline findings, high risk, bilateral: Secondary | ICD-10-CM | POA: Diagnosis not present

## 2021-01-19 DIAGNOSIS — H0102B Squamous blepharitis left eye, upper and lower eyelids: Secondary | ICD-10-CM | POA: Diagnosis not present

## 2021-01-19 DIAGNOSIS — H1045 Other chronic allergic conjunctivitis: Secondary | ICD-10-CM | POA: Diagnosis not present

## 2021-01-19 DIAGNOSIS — H0012 Chalazion right lower eyelid: Secondary | ICD-10-CM | POA: Diagnosis not present

## 2021-01-26 DIAGNOSIS — H1045 Other chronic allergic conjunctivitis: Secondary | ICD-10-CM | POA: Diagnosis not present

## 2021-01-26 DIAGNOSIS — H40033 Anatomical narrow angle, bilateral: Secondary | ICD-10-CM | POA: Diagnosis not present

## 2021-01-26 DIAGNOSIS — L719 Rosacea, unspecified: Secondary | ICD-10-CM | POA: Diagnosis not present

## 2021-01-26 DIAGNOSIS — H0102A Squamous blepharitis right eye, upper and lower eyelids: Secondary | ICD-10-CM | POA: Diagnosis not present

## 2021-01-26 DIAGNOSIS — H0012 Chalazion right lower eyelid: Secondary | ICD-10-CM | POA: Diagnosis not present

## 2021-01-26 DIAGNOSIS — H0102B Squamous blepharitis left eye, upper and lower eyelids: Secondary | ICD-10-CM | POA: Diagnosis not present

## 2021-01-26 DIAGNOSIS — H40023 Open angle with borderline findings, high risk, bilateral: Secondary | ICD-10-CM | POA: Diagnosis not present

## 2021-01-26 DIAGNOSIS — H47323 Drusen of optic disc, bilateral: Secondary | ICD-10-CM | POA: Diagnosis not present

## 2021-02-15 ENCOUNTER — Other Ambulatory Visit: Payer: Self-pay

## 2021-02-15 ENCOUNTER — Ambulatory Visit (INDEPENDENT_AMBULATORY_CARE_PROVIDER_SITE_OTHER): Payer: Medicaid Other | Admitting: *Deleted

## 2021-02-15 DIAGNOSIS — H0102A Squamous blepharitis right eye, upper and lower eyelids: Secondary | ICD-10-CM | POA: Diagnosis not present

## 2021-02-15 DIAGNOSIS — J4540 Moderate persistent asthma, uncomplicated: Secondary | ICD-10-CM | POA: Diagnosis not present

## 2021-02-15 DIAGNOSIS — H1045 Other chronic allergic conjunctivitis: Secondary | ICD-10-CM | POA: Diagnosis not present

## 2021-02-15 DIAGNOSIS — L719 Rosacea, unspecified: Secondary | ICD-10-CM | POA: Diagnosis not present

## 2021-02-15 DIAGNOSIS — H0102B Squamous blepharitis left eye, upper and lower eyelids: Secondary | ICD-10-CM | POA: Diagnosis not present

## 2021-02-15 DIAGNOSIS — H0012 Chalazion right lower eyelid: Secondary | ICD-10-CM | POA: Diagnosis not present

## 2021-02-15 DIAGNOSIS — H40033 Anatomical narrow angle, bilateral: Secondary | ICD-10-CM | POA: Diagnosis not present

## 2021-02-15 DIAGNOSIS — H47323 Drusen of optic disc, bilateral: Secondary | ICD-10-CM | POA: Diagnosis not present

## 2021-02-15 DIAGNOSIS — H40023 Open angle with borderline findings, high risk, bilateral: Secondary | ICD-10-CM | POA: Diagnosis not present

## 2021-02-17 ENCOUNTER — Ambulatory Visit: Payer: Medicaid Other

## 2021-02-18 ENCOUNTER — Ambulatory Visit (INDEPENDENT_AMBULATORY_CARE_PROVIDER_SITE_OTHER): Payer: Medicaid Other

## 2021-02-18 ENCOUNTER — Other Ambulatory Visit: Payer: Self-pay

## 2021-02-18 DIAGNOSIS — Z23 Encounter for immunization: Secondary | ICD-10-CM | POA: Diagnosis present

## 2021-02-21 ENCOUNTER — Ambulatory Visit
Admission: RE | Admit: 2021-02-21 | Discharge: 2021-02-21 | Disposition: A | Discharge status: Home / Self Care | Payer: Medicaid Other | Source: Ambulatory Visit | Attending: Family Medicine | Admitting: Family Medicine

## 2021-02-21 ENCOUNTER — Other Ambulatory Visit: Payer: Self-pay

## 2021-02-21 DIAGNOSIS — Z1231 Encounter for screening mammogram for malignant neoplasm of breast: Secondary | ICD-10-CM | POA: Diagnosis not present

## 2021-03-10 ENCOUNTER — Telehealth: Payer: Self-pay | Admitting: Allergy and Immunology

## 2021-03-10 NOTE — Telephone Encounter (Signed)
Kelsey Ames, do you know what medication?

## 2021-03-10 NOTE — Telephone Encounter (Signed)
Pharmacy waiting on status of  PA

## 2021-03-10 NOTE — Telephone Encounter (Signed)
I am not sure if she was irritated because I told her she couldn't speak with a nurse but when I told her I could take a msg she said okay and hung up.

## 2021-03-15 ENCOUNTER — Other Ambulatory Visit: Payer: Self-pay

## 2021-03-15 ENCOUNTER — Ambulatory Visit: Payer: Medicaid Other

## 2021-03-22 ENCOUNTER — Ambulatory Visit (INDEPENDENT_AMBULATORY_CARE_PROVIDER_SITE_OTHER): Payer: Medicaid Other | Admitting: Allergy and Immunology

## 2021-03-22 ENCOUNTER — Ambulatory Visit: Payer: Medicaid Other

## 2021-03-22 ENCOUNTER — Other Ambulatory Visit: Payer: Self-pay

## 2021-03-22 VITALS — BP 112/70 | HR 56 | Temp 97.9°F | Resp 16 | Ht 64.0 in | Wt 246.2 lb

## 2021-03-22 DIAGNOSIS — J45901 Unspecified asthma with (acute) exacerbation: Secondary | ICD-10-CM | POA: Diagnosis not present

## 2021-03-22 DIAGNOSIS — L219 Seborrheic dermatitis, unspecified: Secondary | ICD-10-CM | POA: Diagnosis not present

## 2021-03-22 DIAGNOSIS — J4540 Moderate persistent asthma, uncomplicated: Secondary | ICD-10-CM

## 2021-03-22 MED ORDER — PAZEO 0.7 % OP SOLN: 1.0000 [drp] | Freq: Every day | OPHTHALMIC | 2 refills | Status: DC

## 2021-03-22 MED ORDER — TRIAMCINOLONE ACETONIDE 0.025 % EX OINT: TOPICAL_OINTMENT | Freq: Two times a day (BID) | CUTANEOUS | 5 refills | Status: DC

## 2021-03-22 MED ORDER — CETIRIZINE HCL 10 MG PO TABS: 10.0000 mg | ORAL_TABLET | Freq: Two times a day (BID) | ORAL | 5 refills | Status: DC | PRN

## 2021-03-22 MED ORDER — ALBUTEROL SULFATE HFA 108 (90 BASE) MCG/ACT IN AERS: 2.0000 | INHALATION_SPRAY | RESPIRATORY_TRACT | 1 refills | Status: DC | PRN

## 2021-03-22 MED ORDER — ALBUTEROL SULFATE (2.5 MG/3ML) 0.083% IN NEBU: 2.5000 mg | INHALATION_SOLUTION | RESPIRATORY_TRACT | 0 refills | Status: DC | PRN

## 2021-03-22 MED ORDER — EPINEPHRINE 0.3 MG/0.3ML IJ SOAJ: 0.3000 mg | Freq: Once | INTRAMUSCULAR | 2 refills | Status: DC

## 2021-03-22 MED ORDER — DULERA 200-5 MCG/ACT IN AERO: 2.0000 | INHALATION_SPRAY | Freq: Two times a day (BID) | RESPIRATORY_TRACT | 5 refills | Status: DC

## 2021-03-22 MED ORDER — FLUTICASONE PROPIONATE 50 MCG/ACT NA SUSP: NASAL | 5 refills | Status: DC

## 2021-03-22 MED ORDER — FAMOTIDINE 40 MG PO TABS: 40.0000 mg | ORAL_TABLET | Freq: Every day | ORAL | 5 refills | Status: DC

## 2021-03-22 MED ORDER — MONTELUKAST SODIUM 10 MG PO TABS: ORAL_TABLET | ORAL | 3 refills | Status: DC

## 2021-03-22 NOTE — Progress Notes (Signed)
[place]   Follow-up Note  Referring Provider: [name], MD Primary Provider: [name], MD Date of Office Visit: 03/22/2021  Subjective:   Kelsey Bullock (DOB: 06/02/1961) is a 60 y.o. female who returns to the [facility] [place] [facility] on 03/22/2021 in re-evaluation of the following:  HPI: Kelsey Bullock returns to this clinic in evaluation of asthma, allergic rhinitis, suspected obesity hypoventilation syndrome, and reflux.  Her last visit to this clinic was 14 September 2020.  She has really done very well and as noted in her last visit she has basically tapered off most of her anti-inflammatory agents for her airway inflammation and continues to use Xolair on a consistent basis which has resulted in excellent control of her asthma without the need for short acting bronchodilator and she has had very little issues with her nose.  As well, her reflux is under control with intermittent use of famotidine.  However, about 3 days ago she got stuffy and had drainage in her throat and was sneezing with clear rhinorrhea.  She has never really been able to smell very well since her episode of COVID in 2020.  She has had a little bit of cough but no shortness of breath or chest tightness.  She has had a history of COVID pneumonitis 2020, 3 Saxis vaccines, and she has received this years flu vaccine.  Allergies as of 03/22/2021       Reactions   Amoxicillin Anaphylaxis, Swelling   Etodolac Anaphylaxis, Shortness Of Breath    lips swelled   Penicillins Anaphylaxis, Swelling   DID THE REACTION INVOLVE: Swelling of the face/tongue/throat, SOB, or low BP? Yes Sudden or severe rash/hives, skin peeling, or the inside of the mouth or nose? No Did it require medical treatment? No When did it last happen?      20 years ago If all above answers are "NO", may proceed with cephalosporin use.   Ampicillin Swelling   Augmentin  [amoxicillin-pot Clavulanate] Swelling   Diclofenac Sodium    Unknown reaction    Pantoprazole Sodium    unspecified   Adhesive [tape] Rash        Medication List    acetaminophen 500 MG tablet Commonly known as: TYLENOL Take 1,000 mg by mouth 2 (two) times daily as needed for moderate pain or headache.   albuterol (2.5 MG/3ML) 0.083% nebulizer solution Commonly known as: PROVENTIL Take 3 mLs (2.5 mg total) by nebulization every 4 (four) hours as needed. USE 1 VIAL VIA NEBULIZER EVERY 4 TO 6 HOURS AS NEEDED FOR COUGH OR WHEEZING   albuterol 108 (90 Base) MCG/ACT inhaler Commonly known as: VENTOLIN HFA Inhale 2 puffs into the lungs every 4 (four) hours as needed.   atorvastatin 40 MG tablet Commonly known as: LIPITOR TAKE 1 TABLET(40 MG) BY MOUTH AT BEDTIME   B-6 50 MG Tabs Take 50 mg by mouth daily.   B12 FOLATE PO Take 1 tablet by mouth daily.   Bepotastine Besilate 1.5 % Soln Place 1 drop into both eyes 2 (two) times daily.   calcium carbonate 500 MG chewable tablet Commonly known as: TUMS - dosed in mg elemental calcium Chew 2-3 tablets by mouth daily as needed for indigestion or heartburn.   cetirizine 10 MG tablet Commonly known as: ZYRTEC TAKE 1 TABLET(10 MG) BY MOUTH DAILY   clonazePAM 0.5 MG tablet Commonly known as: KLONOPIN TAKE 1 TABLET(0.5 MG) BY MOUTH AT BEDTIME  COLLAGEN PO Take 1 Scoop by mouth daily.   Cranberry 1000 MG Caps Take 1,000 mg by mouth daily.   cromolyn 4 % ophthalmic solution Commonly known as: OPTICROM 1 drop 4 (four) times daily.   cyclobenzaprine 10 MG tablet Commonly known as: FLEXERIL TAKE 1 TABLET BY MOUTH AT BEDTIME AS NEEDED FOR BACK OR NECK PAIN OR SPASMS   D3 VITAMIN PO Take by mouth.   Dulera 200-5 MCG/ACT Aero Generic drug: mometasone-formoterol Inhale 2 puffs into the lungs 2 (two) times daily.   EPINEPHrine 0.3 mg/0.3 mL Soaj injection Commonly known as: EpiPen 2-Pak Inject 0.3 mg into the muscle once  for 1 dose.   erythromycin ophthalmic ointment Place 1 application into both eyes daily.   EYE IRRITATION RELIEF OP Place 1 spray into both eyes daily.   Eysuvis 0.25 % Susp Generic drug: Loteprednol Etabonate Apply 1 drop to eye 4 (four) times daily.   famotidine 40 MG tablet Commonly known as: PEPCID Take 40 mg by mouth daily.   fluticasone 50 MCG/ACT nasal spray Commonly known as: FLONASE SHAKE LIQUID AND USE 2 SPRAYS IN EACH NOSTRIL DAILY   linaclotide 72 MCG capsule Commonly known as: LINZESS Take 144 mcg by mouth daily before breakfast.   montelukast 10 MG tablet Commonly known as: SINGULAIR TAKE 1 TABLET(10 MG) BY MOUTH AT BEDTIME   mupirocin ointment 2 % Commonly known as: BACTROBAN Apply 1 application topically 2 (two) times daily. Right ear   norethindrone 5 MG tablet Commonly known as: AYGESTIN Take 2.5 mg by mouth at bedtime.   nystatin powder Commonly known as: nystatin APPLY TOPICALLY TO THE AFFECTED AREA THREE TIMES DAILY   ondansetron 4 MG disintegrating tablet Commonly known as: Zofran ODT Take 1 tablet (4 mg total) by mouth every 4 (four) hours as needed for nausea or vomiting.   oxyCODONE 5 MG immediate release tablet Commonly known as: Oxy IR/ROXICODONE Take 1 tablet (5 mg total) by mouth every 6 (six) hours as needed for severe pain.   Pazeo 0.7 % Soln Generic drug: Olopatadine HCl Place 1 drop into both eyes at bedtime.   polyethylene glycol powder 17 GM/SCOOP powder Commonly known as: GLYCOLAX/MIRALAX Take 17 g by mouth 2 (two) times daily as needed.   PROBIOTIC DAILY PO Take 1 capsule by mouth daily.   triamcinolone 0.025 % ointment Commonly known as: KENALOG Apply topically 2 (two) times daily.   vitamin C 1000 MG tablet Take 1,000 mg by mouth daily.   Vitamin E 100 units Tabs Take 100 Units by mouth daily.   Xolair 150 MG injection Generic drug: omalizumab INJECT 300 MG UNDER THE SKIN EVERY 28 DAYS    Past Medical  History:  Diagnosis Date   Allergic rhinitis    Anemia    Anxiety    Arthritis    knees and back   Asthma    Complication of anesthesia    Congestive heart failure (CHF) (HCC)    Depression    Gall stone    GERD (gastroesophageal reflux disease)    Headache    migraines, stress headaches   History of kidney stones    Hypertension    Hyperthyroidism    during pregnancy   Pneumonia    PONV (postoperative nausea and vomiting)    Recurrent upper respiratory infection (URI)    Sleep apnea    can't use Cpap    Past Surgical History:  Procedure Laterality Date   ADENOIDECTOMY     APPLICATION OF  WOUND VAC  69/62/9528   APPLICATION OF WOUND VAC N/A 03/18/2019   Procedure: Application Of Wound Vac;  Surgeon: Donnie Mesa, MD;  Location: [facility];  Service: General;  Laterality: N/A;   BIOPSY  05/27/2018   Procedure: BIOPSY;  Surgeon: Mauri Pole, MD;  Location: [facility];  Service: Endoscopy;;   CESAREAN SECTION     COLONOSCOPY WITH PROPOFOL N/A 05/27/2018   Procedure: COLONOSCOPY WITH PROPOFOL;  Surgeon: Mauri Pole, MD;  Location: [facility];  Service: Endoscopy;  Laterality: N/A;   CYST EXCISION     EXPLORATORY LAPAROTOMY  02/03/2019   INGUINAL HERNIA REPAIR N/A 02/03/2019   Procedure: EXPLORATORY LAPAROTOMY WITH SMALL BOWEL RESECTION AND PRIMARY CLOSURE OF VENTRAL HERNIA;  Surgeon: Donnie Mesa, MD;  Location: [facility];  Service: General;  Laterality: N/A;   INSERTION OF MESH N/A 11/25/2020   Procedure: INSERTION OF MESH;  Surgeon: Donnie Mesa, MD;  Location: [facility];  Service: General;  Laterality: N/A;   OVARIAN CYST REMOVAL     POLYPECTOMY  05/27/2018   Procedure: POLYPECTOMY;  Surgeon: Mauri Pole, MD;  Location: [facility];  Service: Endoscopy;;   removal of gallstones     TONSILLECTOMY     TYMPANOSTOMY TUBE PLACEMENT     VENTRAL HERNIA REPAIR N/A 11/25/2020   Procedure: RECURRENT VENTRAL HERNIA REPAIR WITH MESH;  Surgeon: Donnie Mesa,  MD;  Location: [facility];  Service: General;  Laterality: N/A;   WOUND EXPLORATION  03/18/2019   abdominal   WOUND EXPLORATION N/A 03/18/2019   Procedure: ABDOMINAL WOUND EXPLORATION;  Surgeon: Donnie Mesa, MD;  Location: [facility];  Service: General;  Laterality: N/A;    Review of systems negative except as noted in HPI / PMHx or noted below:  Review of Systems  Constitutional: Negative.   HENT: Negative.    Eyes: Negative.   Respiratory: Negative.    Cardiovascular: Negative.   Gastrointestinal: Negative.   Genitourinary: Negative.   Musculoskeletal: Negative.   Skin: Negative.   Neurological: Negative.   Endo/Heme/Allergies: Negative.   Psychiatric/Behavioral: Negative.      Objective:   Vitals:   03/22/21 0854  BP: 112/70  Pulse: (!) 56  Resp: 16  Temp: 97.9 F (36.6 C)  SpO2: 98%   Height: 5\' 4"  (162.6 cm)  Weight: 246 lb 3.2 oz (111.7 kg)   Physical Exam Constitutional:      Appearance: She is not diaphoretic.  HENT:     Head: Normocephalic.     Right Ear: Tympanic membrane, ear canal and external ear normal.     Left Ear: Tympanic membrane, ear canal and external ear normal.     Nose: Nose normal. No mucosal edema or rhinorrhea.     Mouth/Throat:     Pharynx: Uvula midline. No oropharyngeal exudate.  Eyes:     Conjunctiva/sclera: Conjunctivae normal.  Neck:     Thyroid: No thyromegaly.     Trachea: Trachea normal. No tracheal tenderness or tracheal deviation.  Cardiovascular:     Rate and Rhythm: Normal rate and regular rhythm.     Heart sounds: Normal heart sounds, S1 normal and S2 normal. No murmur heard. Pulmonary:     Effort: No respiratory distress.     Breath sounds: Normal breath sounds. No stridor. No wheezing or rales.  Lymphadenopathy:     Head:     Right side of head: No tonsillar adenopathy.     Left side of head: No tonsillar adenopathy.     Cervical: No  cervical adenopathy.  Skin:    Findings: No erythema or rash.     Nails: There is  no clubbing.  Neurological:     Mental Status: She is alert.    Diagnostics:    Spirometry was performed and demonstrated an FEV1 of 2.10 at 81 % of predicted.  Assessment and Plan:   1. Moderate persistent asthma without complication   2. Seborrheic eczema   3. Asthma with acute exacerbation, unspecified asthma severity, unspecified whether persistent      1. Continue Flonase 1 spray each nostril 1-7 times per week depending on upper airway symptom activity  2. Continue montelukast 10 mg daily  3. Continue Xolair (Epi-Pen)  4.  If needed:   A. Pro-air HFA  B. Antihistamine   C. Famotidine 20 mg daily  E. Nasal ipratropium 0.06% - 2 sprays each nostril every 6 hours  5. For this recent event:   A. Nasal saline a few times per day  B. Restart Dulera 200 - 2 inhalations 2 times per day  C. Prednisone 10 mg - 1 tablet 1 time per day for 5 days only  6. Return to clinic in 6 months or earlier if problem  U[place]ell appears to be doing very well until she contracted a viral respiratory tract infection which appears to be relatively mild and we will treat her with the therapy noted above which includes the addition of a low-dose systemic steroid for just a few days and she can restart her Dulera to prevent her lower airway from becoming inflamed.  Assuming she does well with this plan she will continue to use Xolair and montelukast as her major controller agents to address her inflammatory disease of her respiratory tract and we will see her back in this clinic in 6 months or earlier if there is a problem.  Allena Katz, MD Allergy / Immunology [place]

## 2021-03-22 NOTE — Patient Instructions (Signed)
  1. Continue Flonase 1 spray each nostril 1-7 times per week depending on upper airway symptom activity  2. Continue montelukast 10 mg daily  3. Continue Xolair (Epi-Pen)  4.  If needed:   A. Pro-air HFA  B. Antihistamine   C. Famotidine 20 mg daily  E. Nasal ipratropium 0.06% - 2 sprays each nostril every 6 hours  5. For this recent event:   A. Nasal saline a few times per day  B. Restart Dulera 200 - 2 inhalations 2 times per day  C. Prednisone 10 mg - 1 tablet 1 time per day for 5 days only  6. Return to clinic in 6 months or earlier if problem

## 2021-03-23 ENCOUNTER — Encounter: Payer: Self-pay | Admitting: Allergy and Immunology

## 2021-03-25 ENCOUNTER — Other Ambulatory Visit: Payer: Self-pay | Admitting: *Deleted

## 2021-04-07 ENCOUNTER — Other Ambulatory Visit: Payer: Self-pay | Admitting: Allergy and Immunology

## 2021-04-08 ENCOUNTER — Other Ambulatory Visit: Payer: Self-pay | Admitting: *Deleted

## 2021-04-08 MED ORDER — EPIPEN 2-PAK 0.3 MG/0.3ML IJ SOAJ: 0.3000 mg | Freq: Once | INTRAMUSCULAR | 1 refills | Status: DC

## 2021-04-08 NOTE — Telephone Encounter (Signed)
Patient changed insurance to [facility]. She said insurance is now requiring documentation as to why she needs an EpiPen. She is unsure of what this is all about. But she states she needs an EpiPen now.

## 2021-04-08 NOTE — Telephone Encounter (Signed)
Received a fax from insurance stating that as long as it is name brand EpiPen it is covered. Faxed form over to pharmacy. Called and left a detailed voicemail advising patient per [facility] permission. Form has been labeled and placed in bulks scanning.

## 2021-04-08 NOTE — Telephone Encounter (Signed)
PA has been submitted through CoverMyMeds for [facility] and is currently pending approval/denial. ID [id number] [place], BIN [id number], PCN [id number], GRP [id number].

## 2021-04-12 ENCOUNTER — Other Ambulatory Visit: Payer: Self-pay | Admitting: Allergy and Immunology

## 2021-04-19 ENCOUNTER — Ambulatory Visit: Payer: Medicaid Other | Admitting: *Deleted

## 2021-04-19 ENCOUNTER — Other Ambulatory Visit: Payer: Self-pay

## 2021-04-19 DIAGNOSIS — J4540 Moderate persistent asthma, uncomplicated: Secondary | ICD-10-CM

## 2021-04-29 ENCOUNTER — Ambulatory Visit (INDEPENDENT_AMBULATORY_CARE_PROVIDER_SITE_OTHER): Payer: Medicaid Other

## 2021-04-29 ENCOUNTER — Ambulatory Visit (HOSPITAL_COMMUNITY)
Admission: EM | Admit: 2021-04-29 | Discharge: 2021-04-29 | Disposition: A | Discharge status: Home / Self Care | Payer: Medicaid Other | Attending: Emergency Medicine | Admitting: Emergency Medicine

## 2021-04-29 ENCOUNTER — Encounter (HOSPITAL_COMMUNITY): Payer: Self-pay | Admitting: Emergency Medicine

## 2021-04-29 ENCOUNTER — Other Ambulatory Visit: Payer: Self-pay

## 2021-04-29 DIAGNOSIS — W19XXXA Unspecified fall, initial encounter: Secondary | ICD-10-CM

## 2021-04-29 DIAGNOSIS — R0782 Intercostal pain: Secondary | ICD-10-CM | POA: Diagnosis not present

## 2021-04-29 DIAGNOSIS — M25562 Pain in left knee: Secondary | ICD-10-CM

## 2021-04-29 DIAGNOSIS — R0781 Pleurodynia: Secondary | ICD-10-CM

## 2021-04-29 DIAGNOSIS — R600 Localized edema: Secondary | ICD-10-CM | POA: Diagnosis not present

## 2021-04-29 MED ORDER — IBUPROFEN 600 MG PO TABS: 600.0000 mg | ORAL_TABLET | Freq: Four times a day (QID) | ORAL | 0 refills | Status: DC | PRN

## 2021-04-29 MED ORDER — TIZANIDINE HCL 4 MG PO TABS: 4.0000 mg | ORAL_TABLET | Freq: Three times a day (TID) | ORAL | 0 refills | Status: DC | PRN

## 2021-04-29 NOTE — Discharge Instructions (Addendum)
Fortunately, your x-rays were negative for fracture or for any other acute complications from your fall.  Try 600 mg of ibuprofen combined with 1000 mg of Tylenol together 3-4 times a day as needed for pain.  If the ibuprofen is not helping, then you can start Voltaren gel.  Do not take both ibuprofen and Voltaren gel.  Do one of the other.  The Voltaren gel will take several days for it to start working, but it works very well.  Zanaflex will help with muscle spasms.  Ice or heat, whichever feels better.  Please follow-up with your doctor if not better in a week, go to the ER if you get worse, fevers above 100.4, difficulty breathing, coughing up blood, or for other concerns.

## 2021-04-29 NOTE — ED Provider Notes (Signed)
HPI  SUBJECTIVE:  Kelsey Bullock is a 60 y.o. female who presents with constant diffuse body aches and soreness after stumbling, falling forward directly onto both of her knees, left ribs 4 days ago.  She also states that she hit her head, and had some nausea and headache, but these have resolved.  No loss of consciousness, amnesia.  Her primary areas of pain are at a ventral hernia repair in her abdomen, left knee and left chest.  She denies abdominal swelling, erythema at the scar site, fluid leaking, bleeding.  She states that it feels as if it is pulling apart.  No aggravating factors.   She has been ambulatory on her knee.  No bruising, swelling, distal numbness or tingling.  Symptoms are worse with movement and walking.  Rib pain: No cough, wheezing, shortness of breath.  Symptoms are aggravated with torso rotation, coughing and sneezing.  She tried some leftover oxycodone with improvement in her symptoms.  She has a past medical history of fibromyalgia, asthma, CHF, hypertension, hypothyroidism, osteoporosis, and is status post ventral hernia repair in June 2022.  She is not on any anticoagulants or antiplatelets.  [id number], [name], MD  Past Medical History:  Diagnosis Date   Allergic rhinitis    Anemia    Anxiety    Arthritis    knees and back   Asthma    Complication of anesthesia    Congestive heart failure (CHF) ([place])    Depression    Gall stone    GERD (gastroesophageal reflux disease)    Headache    migraines, stress headaches   History of kidney stones    Hypertension    Hyperthyroidism    during pregnancy   Pneumonia    PONV (postoperative nausea and vomiting)    Recurrent upper respiratory infection (URI)    Sleep apnea    can't use Cpap    Past Surgical History:  Procedure Laterality Date   ADENOIDECTOMY     APPLICATION OF WOUND VAC  62/13/0865   APPLICATION OF WOUND VAC N/A 03/18/2019   Procedure: Application Of Wound Vac;  Surgeon: Donnie Mesa,  MD;  Location: [place];  Service: General;  Laterality: N/A;   BIOPSY  05/27/2018   Procedure: BIOPSY;  Surgeon: Mauri Pole, MD;  Location: [facility];  Service: Endoscopy;;   CESAREAN SECTION     COLONOSCOPY WITH PROPOFOL N/A 05/27/2018   Procedure: COLONOSCOPY WITH PROPOFOL;  Surgeon: Mauri Pole, MD;  Location: [facility];  Service: Endoscopy;  Laterality: N/A;   CYST EXCISION     EXPLORATORY LAPAROTOMY  02/03/2019   INGUINAL HERNIA REPAIR N/A 02/03/2019   Procedure: EXPLORATORY LAPAROTOMY WITH SMALL BOWEL RESECTION AND PRIMARY CLOSURE OF VENTRAL HERNIA;  Surgeon: Donnie Mesa, MD;  Location: [place];  Service: General;  Laterality: N/A;   INSERTION OF MESH N/A 11/25/2020   Procedure: INSERTION OF MESH;  Surgeon: Donnie Mesa, MD;  Location: [place];  Service: General;  Laterality: N/A;   OVARIAN CYST REMOVAL     POLYPECTOMY  05/27/2018   Procedure: POLYPECTOMY;  Surgeon: Mauri Pole, MD;  Location: [facility];  Service: Endoscopy;;   removal of gallstones     TONSILLECTOMY     TYMPANOSTOMY TUBE PLACEMENT     VENTRAL HERNIA REPAIR N/A 11/25/2020   Procedure: RECURRENT VENTRAL HERNIA REPAIR WITH MESH;  Surgeon: Donnie Mesa, MD;  Location: [place];  Service: General;  Laterality: N/A;   WOUND EXPLORATION  03/18/2019   abdominal  WOUND EXPLORATION N/A 03/18/2019   Procedure: ABDOMINAL WOUND EXPLORATION;  Surgeon: Donnie Mesa, MD;  Location: [place];  Service: General;  Laterality: N/A;    Family History  Adopted: Yes  Problem Relation Age of Onset   Diabetes Mother    Diabetes Paternal Uncle    Allergic rhinitis Neg Hx    Angioedema Neg Hx    Asthma Neg Hx    Atopy Neg Hx    Eczema Neg Hx    Immunodeficiency Neg Hx    Urticaria Neg Hx     Social History   Tobacco Use   Smoking status: Never   Smokeless tobacco: Never  Vaping Use   Vaping Use: Never used  Substance Use Topics   Alcohol use: No   Drug use: No     Current  Facility-Administered Medications:    omalizumab [name]) injection 300 mg, 300 mg, Subcutaneous, Q28 days, Kozlow, Donnamarie Poag, MD, 300 mg at 04/19/21 8563  Current Outpatient Medications:    ibuprofen (ADVIL) 600 MG tablet, Take 1 tablet (600 mg total) by mouth every 6 (six) hours as needed., Disp: 30 tablet, Rfl: 0   tiZANidine (ZANAFLEX) 4 MG tablet, Take 1 tablet (4 mg total) by mouth every 8 (eight) hours as needed for muscle spasms., Disp: 30 tablet, Rfl: 0   albuterol (PROVENTIL) (2.5 MG/3ML) 0.083% nebulizer solution, Take 3 mLs (2.5 mg total) by nebulization every 4 (four) hours as needed. USE 1 VIAL VIA NEBULIZER EVERY 4 TO 6 HOURS AS NEEDED FOR COUGH OR WHEEZING, Disp: 225 mL, Rfl: 0   albuterol (VENTOLIN HFA) 108 (90 Base) MCG/ACT inhaler, Inhale 2 puffs into the lungs every 4 (four) hours as needed., Disp: 8.5 g, Rfl: 1   Ascorbic Acid (VITAMIN C) 1000 MG tablet, Take 1,000 mg by mouth daily., Disp: , Rfl:    atorvastatin (LIPITOR) 40 MG tablet, TAKE 1 TABLET(40 MG) BY MOUTH AT BEDTIME, Disp: 90 tablet, Rfl: 3   Bepotastine Besilate 1.5 % SOLN, Place 1 drop into both eyes 2 (two) times daily., Disp: , Rfl:    calcium carbonate (TUMS - DOSED IN MG ELEMENTAL CALCIUM) 500 MG chewable tablet, Chew 2-3 tablets by mouth daily as needed for indigestion or heartburn., Disp: , Rfl:    cetirizine (ZYRTEC) 10 MG tablet, Take 1 tablet (10 mg total) by mouth 2 (two) times daily as needed for allergies (Can take an extra dose for breakthrough symptoms)., Disp: 60 tablet, Rfl: 5   Cholecalciferol (D3 VITAMIN PO), Take by mouth., Disp: , Rfl:    clonazePAM (KLONOPIN) 0.5 MG tablet, TAKE 1 TABLET(0.5 MG) BY MOUTH AT BEDTIME, Disp: 30 tablet, Rfl: 5   Cobalamin Combinations (B12 FOLATE PO), Take 1 tablet by mouth daily., Disp: , Rfl:    COLLAGEN PO, Take 1 Scoop by mouth daily., Disp: , Rfl:    Cranberry 1000 MG CAPS, Take 1,000 mg by mouth daily., Disp: , Rfl:    cromolyn (OPTICROM) 4 % ophthalmic  solution, 1 drop 4 (four) times daily., Disp: , Rfl:    EPINEPHrine (EPIPEN 2-PAK) 0.3 mg/0.3 mL IJ SOAJ injection, Inject 0.3 mg into the muscle once for 1 dose., Disp: 2 each, Rfl: 2   EPIPEN 2-PAK 0.3 MG/0.3ML SOAJ injection, Inject 0.3 mg into the muscle once for 1 dose., Disp: 0.3 mL, Rfl: 1   erythromycin ophthalmic ointment, Place 1 application into both eyes daily., Disp: , Rfl:    EYSUVIS 0.25 % SUSP, Apply 1 drop to eye 4 (four)  times daily., Disp: , Rfl:    famotidine (PEPCID) 40 MG tablet, Take 1 tablet (40 mg total) by mouth daily., Disp: 30 tablet, Rfl: 5   fluticasone (FLONASE) 50 MCG/ACT nasal spray, SHAKE LIQUID AND USE 2 SPRAYS IN EACH NOSTRIL DAILY, Disp: 16 g, Rfl: 5   linaclotide (LINZESS) 72 MCG capsule, Take 144 mcg by mouth daily before breakfast., Disp: , Rfl:    mometasone-formoterol (DULERA) 200-5 MCG/ACT AERO, Inhale 2 puffs into the lungs 2 (two) times daily., Disp: 13 g, Rfl: 5   montelukast (SINGULAIR) 10 MG tablet, TAKE 1 TABLET(10 MG) BY MOUTH AT BEDTIME, Disp: 30 tablet, Rfl: 3   mupirocin ointment (BACTROBAN) 2 %, Apply 1 application topically 2 (two) times daily. Right ear, Disp: 22 g, Rfl: 0   norethindrone (AYGESTIN) 5 MG tablet, Take 2.5 mg by mouth at bedtime., Disp: , Rfl:    nystatin powder, APPLY TOPICALLY TO THE AFFECTED AREA THREE TIMES DAILY, Disp: 60 g, Rfl: 3   Olopatadine HCl (PAZEO) 0.7 % SOLN, Place 1 drop into both eyes at bedtime., Disp: 2.5 mL, Rfl: 2   ondansetron (ZOFRAN ODT) 4 MG disintegrating tablet, Take 1 tablet (4 mg total) by mouth every 4 (four) hours as needed for nausea or vomiting., Disp: 20 tablet, Rfl: 0   oxyCODONE (OXY IR/ROXICODONE) 5 MG immediate release tablet, Take 1 tablet (5 mg total) by mouth every 6 (six) hours as needed for severe pain., Disp: 30 tablet, Rfl: 0   polyethylene glycol powder (GLYCOLAX/MIRALAX) 17 GM/SCOOP powder, Take 17 g by mouth 2 (two) times daily as needed., Disp: 3350 g, Rfl: 5   Probiotic Product  (PROBIOTIC DAILY PO), Take 1 capsule by mouth daily., Disp: , Rfl:    Pyridoxine HCl (B-6) 50 MG TABS, Take 50 mg by mouth daily., Disp: , Rfl:    Tetrahydroz-Polyvinyl Al-Povid (EYE IRRITATION RELIEF OP), Place 1 spray into both eyes daily., Disp: , Rfl:    triamcinolone (KENALOG) 0.025 % ointment, Apply topically 2 (two) times daily., Disp: 454 g, Rfl: 5   Vitamin E 100 units TABS, Take 100 Units by mouth daily., Disp: , Rfl:    XOLAIR 150 MG injection, INJECT 300 MG UNDER THE SKIN EVERY 28 DAYS, Disp: 2 each, Rfl: 11  Allergies  Allergen Reactions   Amoxicillin Anaphylaxis and Swelling   Etodolac Anaphylaxis and Shortness Of Breath     lips swelled   Penicillins Anaphylaxis and Swelling    DID THE REACTION INVOLVE: Swelling of the face/tongue/throat, SOB, or low BP? Yes Sudden or severe rash/hives, skin peeling, or the inside of the mouth or nose? No Did it require medical treatment? No When did it last happen?      20 years ago If all above answers are "NO", may proceed with cephalosporin use.    Ampicillin Swelling   Augmentin [Amoxicillin-Pot Clavulanate] Swelling   Diclofenac Sodium     Unknown reaction    Pantoprazole Sodium     unspecified   Adhesive [Tape] Rash     ROS  As noted in HPI.   Physical Exam  BP 125/70 (BP Location: Right Wrist)   Pulse (!) 56   Temp 98.1 F (36.7 C) (Oral)   Resp 18   LMP 02/03/2019 Comment: Tubal Ligation  SpO2 100%   Constitutional: Well developed, well nourished, no acute distress Eyes:  EOMI, conjunctiva normal bilaterally HENT: Normocephalic, atraumatic,mucus membranes moist Respiratory: Normal inspiratory effort, lungs clear bilaterally.  Positive tenderness lower anterior ribs underneath  the breast.  No appreciable crepitus.  No bruising. Cardiovascular: Normal rate GI: nondistended.  Well-healed tender vertical surgical scar from the pubis to inferior to the umbilicus.  No leakage of fluid.  No evidence of dehiscence.  No  surrounding erythema, induration.  No appreciable hernia when patient sits up. skin: No rash, skin intact Musculoskeletal: no deformities L Knee ROM baseline for Pt , Flexion  intact, Tenderness entire joint, Patella tender, Patellar tendon NT, Medial joint tender, Lateral joint tender, Popliteal region NT, Varus MCL stress testing stable, Valgus LCL stress testing stable, McMurray's testing normal , Lachman's negative. Distal NVI with intact baseline sensation / motor / pulse distal to knee.  No appreciable effusion. No erythema. No increased temperature.  Patient ambulatory on left extremity Neurologic: Alert & oriented x 3, no focal neuro deficits Psychiatric: Speech and behavior appropriate   ED Course   Medications - No data to display  Orders Placed This Encounter  Procedures   DG Ribs Unilateral W/Chest Left    Standing Status:   Standing    Number of Occurrences:   1    Order Specific Question:   Reason for Exam (SYMPTOM  OR DIAGNOSIS REQUIRED)    Answer:   fall anterior lower rib tenderness r/o fx   DG Knee AP/LAT W/Sunrise Left    Standing Status:   Standing    Number of Occurrences:   1    Order Specific Question:   Reason for Exam (SYMPTOM  OR DIAGNOSIS REQUIRED)    Answer:   fall, r/o fx, effusion    No results found for this or any previous visit (from the past 24 hour(s)). DG Ribs Unilateral W/Chest Left  Result Date: 04/29/2021 CLINICAL DATA:  Fall, anterior lower rib pain EXAM: LEFT RIBS AND CHEST - 3+ VIEW COMPARISON:  None. FINDINGS: No fracture or other bone lesions are seen involving the ribs. There is no evidence of pneumothorax or pleural effusion. Both lungs are clear. Heart size and mediastinal contours are within normal limits. IMPRESSION: Negative. Electronically Signed   By: Rolm Baptise M.D.   On: 04/29/2021 10:59   DG Knee AP/LAT W/Sunrise Left  Result Date: 04/29/2021 CLINICAL DATA:  Fall, rule out fracture EXAM: LEFT KNEE 3 VIEWS COMPARISON:  None.  FINDINGS: No fracture or dislocation of the left knee. Moderate to severe tricompartmental arthrosis, worst in the medial compartment. No knee joint effusion. Mild soft tissue edema anteriorly. IMPRESSION: 1.  No fracture or dislocation of the left knee. 2. Moderate to severe tricompartmental arthrosis, worst in the medial compartment. 3.  Mild soft tissue edema anteriorly. Electronically Signed   By: Delanna Ahmadi M.D.   On: 04/29/2021 11:04    ED Clinical Impression  1. Rib pain on left side   2. Acute pain of left knee   3. Fall, initial encounter      ED Assessment/Plan  [place] Narcotic database reviewed for this patient, and feel that the risk/benefit ratio today is favorable for proceeding with a prescription for controlled substance.  1.  Chest/rib pain.  Will get x-ray to rule out rib fracture.  2.  Abdominal pain at surgical site: No evidence of dehiscence or infection.  3.  Left knee pain: Diffuse tenderness.  Obtaining AP/lateral knee with sunrise due to direct trauma and history of osteoporosis  Imaging not crossing over.  Per report:. Negative Left ribs of chest . No PTX, effusion. Knee no acute fx, dislocation.  Severe tricompartmental arthrosis.  Anterior soft tissue  edema.  See radiology report for full details.  Imaging negative for fracture or acute changes.  She has severe osteoarthritis in her knee.  Discussed this with patient.  will try Tylenol/ibuprofen together 3-4 times a day.  Patient states that she took ibuprofen earlier this week without any problem.  Zanaflex for muscle spasms, capsaicin cream.  Follow-up with PMD as needed.  ER return precautions given.  Discussed imaging, MDM, treatment plan, and plan for follow-up with patient. Discussed sn/sx that should prompt return to the ED. patient agrees with plan.   Meds ordered this encounter  Medications   tiZANidine (ZANAFLEX) 4 MG tablet    Sig: Take 1 tablet (4 mg total) by mouth every 8 (eight) hours as needed  for muscle spasms.    Dispense:  30 tablet    Refill:  0   ibuprofen (ADVIL) 600 MG tablet    Sig: Take 1 tablet (600 mg total) by mouth every 6 (six) hours as needed.    Dispense:  30 tablet    Refill:  0       *This clinic note was created using [employer]. Therefore, there may be occasional mistakes despite careful proofreading.  ?    Melynda Ripple, MD 04/30/21 [phone]

## 2021-04-29 NOTE — ED Triage Notes (Signed)
Pt presents with body aches after falling 4 days ago.

## 2021-05-13 DIAGNOSIS — F32A Depression, unspecified: Secondary | ICD-10-CM | POA: Diagnosis not present

## 2021-05-13 DIAGNOSIS — F429 Obsessive-compulsive disorder, unspecified: Secondary | ICD-10-CM | POA: Diagnosis not present

## 2021-05-13 DIAGNOSIS — F4310 Post-traumatic stress disorder, unspecified: Secondary | ICD-10-CM | POA: Diagnosis not present

## 2021-05-17 ENCOUNTER — Other Ambulatory Visit: Payer: Self-pay

## 2021-05-17 ENCOUNTER — Ambulatory Visit (INDEPENDENT_AMBULATORY_CARE_PROVIDER_SITE_OTHER): Payer: Medicaid Other | Admitting: *Deleted

## 2021-05-17 DIAGNOSIS — J4540 Moderate persistent asthma, uncomplicated: Secondary | ICD-10-CM | POA: Diagnosis not present

## 2021-06-14 ENCOUNTER — Other Ambulatory Visit: Payer: Self-pay

## 2021-06-14 ENCOUNTER — Ambulatory Visit (INDEPENDENT_AMBULATORY_CARE_PROVIDER_SITE_OTHER): Payer: Medicaid Other | Admitting: *Deleted

## 2021-06-14 DIAGNOSIS — J4540 Moderate persistent asthma, uncomplicated: Secondary | ICD-10-CM | POA: Diagnosis not present

## 2021-06-25 ENCOUNTER — Other Ambulatory Visit: Payer: Self-pay | Admitting: Allergy and Immunology

## 2021-06-27 ENCOUNTER — Telehealth: Payer: Self-pay

## 2021-06-27 ENCOUNTER — Ambulatory Visit (INDEPENDENT_AMBULATORY_CARE_PROVIDER_SITE_OTHER): Payer: Medicaid Other | Admitting: Family Medicine

## 2021-06-27 ENCOUNTER — Other Ambulatory Visit: Payer: Self-pay

## 2021-06-27 ENCOUNTER — Encounter: Payer: Self-pay | Admitting: Family Medicine

## 2021-06-27 DIAGNOSIS — H65192 Other acute nonsuppurative otitis media, left ear: Secondary | ICD-10-CM | POA: Diagnosis present

## 2021-06-27 DIAGNOSIS — G8929 Other chronic pain: Secondary | ICD-10-CM

## 2021-06-27 DIAGNOSIS — M545 Low back pain, unspecified: Secondary | ICD-10-CM

## 2021-06-27 MED ORDER — CYCLOBENZAPRINE HCL 10 MG PO TABS: 10.0000 mg | ORAL_TABLET | Freq: Three times a day (TID) | ORAL | 0 refills | Status: DC | PRN

## 2021-06-27 NOTE — Telephone Encounter (Signed)
As per our discussion today at the appointment, I recommend patient try afrin for three days and then can try her home flonase thereafter to see if ear gets better. If she is still having pain in 2-4 weeks, I recommend ENT referral.   Although flexeril was not discussed at appointment today, I did discuss this with patient's PCP, Dr. Andria Frames. I will give her a refill. Gladys Damme, MD [facility], PGY-3

## 2021-06-27 NOTE — Patient Instructions (Signed)
It was a pleasure to see you today!  For your ear discomfort: try using afrin for 3 days (DO NOT USE LONGER! You can get rebound sinus infections) to help relieve sinus inflammation that is contributing to fluid build up. Also try holding your nose and swallowing or blowing. You may use tylenol/ibuprofen for pain. If no improvement in 3-4 weeks, please return for re-evaluation and you may need to see an ear specialist at that time.    Be Well,  Dr. Chauncey Reading

## 2021-06-27 NOTE — Progress Notes (Signed)
° ° °  SUBJECTIVE:   CHIEF COMPLAINT / HPI:   Jury Duty: Patient requests letter to be excused from jury duty. 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 Will write note.  Left Ear pain: started 1 month ago, got water in it, used q-tip, recurrent pain deep in the ear. She has tried hydrogen peroxide, tried son's medicine (unknown name) without improvement. No fever, no trouble hearing, no strange sounds like ringing or ocean sounds.  PERTINENT  PMH / PSH: pseudotumor cerebri  OBJECTIVE:   BP 135/77    Pulse 65    Wt 258 lb (117 kg)    LMP 02/03/2019 Comment: Tubal Ligation   BMI 44.29 kg/m   Nursing note and vitals reviewed GEN: age-appropriate, WW, resting comfortably in chair, NAD, class III obesity HEENT: NCAT. PERRLA. Sclera without injection or icterus. MMM. Nares erythematous and edematous b/l. Left TM non-erythematous, non-bulging, but suffusion with fluid level visible behind TM. Rght TM n/e, n/b b/l. Neck: Supple. No LAD Neuro: AOx3  Ext: no edema Psych: Pleasant and appropriate   ASSESSMENT/PLAN:   Acute middle ear effusion, left Fluid level seen behind L TM. Recommend blowing nose/swallowing, can also try afrin to help reduce nasal swelling to help drain effusion. Follow up in 3-4 weeks, may need ENT consultation if persists. Discussed supportive care and return precautions.   Jury duty letter written  Kelsey Damme, MD [place]

## 2021-06-27 NOTE — Telephone Encounter (Signed)
Patient calls nurse line requesting referral for ENT.   Patient reports that she has been having significant pain in left ear for the last month. She would like to proceed with having referral for ENT.   Patient states that she is also needing a refill on flexeril. Reports that she wrote this down on papers at visit today.   Please advise.   Talbot Grumbling, RN

## 2021-06-27 NOTE — Assessment & Plan Note (Signed)
Fluid level seen behind L TM. Recommend blowing nose/swallowing, can also try afrin to help reduce nasal swelling to help drain effusion. Follow up in 3-4 weeks, may need ENT consultation if persists. Discussed supportive care and return precautions.

## 2021-06-29 MED ORDER — AZITHROMYCIN 250 MG PO TABS: ORAL_TABLET | ORAL | 0 refills | Status: DC

## 2021-06-29 NOTE — Telephone Encounter (Signed)
Called.  Pain worsening, not improving.  Many previous ear infections.  Allergic to amoxicillin.  Will treat as otitis media with a z pac.  Will make and appointment to see me in 5 days.

## 2021-06-29 NOTE — Telephone Encounter (Signed)
Called patient. Patient reports that afrin has not helped at all and that she was up all night due to pain. Patient states that she is miserable and does not think she can wait 2-4 weeks.   Informed of medication refill. Patient appreciative.   Please advise regarding next steps.   Talbot Grumbling, RN

## 2021-06-29 NOTE — Addendum Note (Signed)
Addended by: [name] on: 06/29/2021 11:06 AM   Modules accepted: Orders

## 2021-06-30 ENCOUNTER — Telehealth: Payer: Self-pay

## 2021-06-30 DIAGNOSIS — G8929 Other chronic pain: Secondary | ICD-10-CM

## 2021-06-30 DIAGNOSIS — H65192 Other acute nonsuppurative otitis media, left ear: Secondary | ICD-10-CM

## 2021-06-30 MED ORDER — AZITHROMYCIN 250 MG PO TABS: ORAL_TABLET | ORAL | 0 refills | Status: DC

## 2021-06-30 MED ORDER — CYCLOBENZAPRINE HCL 10 MG PO TABS: 10.0000 mg | ORAL_TABLET | Freq: Three times a day (TID) | ORAL | 3 refills | Status: DC | PRN

## 2021-06-30 MED ORDER — MONTELUKAST SODIUM 10 MG PO TABS: ORAL_TABLET | ORAL | 3 refills | Status: DC

## 2021-06-30 NOTE — Telephone Encounter (Signed)
Rxes sent as requested.

## 2021-06-30 NOTE — Telephone Encounter (Signed)
Patient calls nurse line reporting [facility] on [address] has closed temporarily.   Patient is requesting recent prescriptions to be resent to [facility] on [address]. Patient reports [facility] is unable to transfer.   Will forward to PCP to send, as multiple providers have sent recent prescriptions.

## 2021-07-04 ENCOUNTER — Encounter: Payer: Self-pay | Admitting: Family Medicine

## 2021-07-04 ENCOUNTER — Other Ambulatory Visit: Payer: Self-pay

## 2021-07-04 ENCOUNTER — Ambulatory Visit (INDEPENDENT_AMBULATORY_CARE_PROVIDER_SITE_OTHER): Payer: Medicaid Other | Admitting: Family Medicine

## 2021-07-04 DIAGNOSIS — H60502 Unspecified acute noninfective otitis externa, left ear: Secondary | ICD-10-CM

## 2021-07-04 DIAGNOSIS — H6090 Unspecified otitis externa, unspecified ear: Secondary | ICD-10-CM | POA: Insufficient documentation

## 2021-07-04 DIAGNOSIS — F3342 Major depressive disorder, recurrent, in full remission: Secondary | ICD-10-CM | POA: Diagnosis not present

## 2021-07-04 MED ORDER — NEOMYCIN-POLYMYXIN-HC 3.5-10000-1 OT SOLN: 3.0000 [drp] | Freq: Four times a day (QID) | OTIC | 0 refills | Status: DC

## 2021-07-04 NOTE — Patient Instructions (Signed)
Maybe you have an outer ear infection.  I sent in drops. Good luck with your mom Good luck with the housing authority.

## 2021-07-05 ENCOUNTER — Encounter: Payer: Self-pay | Admitting: Family Medicine

## 2021-07-05 NOTE — Assessment & Plan Note (Signed)
Exam consistent now with otitis externa

## 2021-07-05 NOTE — Progress Notes (Signed)
° ° °  SUBJECTIVE:   CHIEF COMPLAINT / HPI:   Ear pain: Patient improved with treatment of otitis media with antibiotics and flonase.  Still having ear pain (less) bilateral.  Antibiotics completed.  Marked stress; She has reconnected with her biologic family.  Mother currently dying (hospice care through [facility].)  Sister and mother have been inseperable.  Sister does not drive and will struggle mightily when mother dies.  Surprisingly, Dnasia is now the mature caregiver in the family after years of needing to be cared for. Housing authority will inspect her home soon.  Stressful. Morbid obesity.  She got off her diet for a while and put back on some weight.  Now has stabilize despite stress above.   OBJECTIVE:   BP 129/76    Pulse 68    Wt 254 lb 9.6 oz (115.5 kg)    LMP 02/03/2019 Comment: Tubal Ligation   SpO2 100%    BMI 43.70 kg/m   Pain to manipulation of pinna and tragus bilaterally. Psych Cognition and affect normal.  ASSESSMENT/PLAN:   Depression, major, recurrent (HCC) Mild acute situational worsening.  She has gone from care receiver to care giver.  Nice growth.  Otitis externa Exam consistent now with otitis externa  Morbid obesity ([place]) Stabilized weight gain.  She is commited to getting back on her diet which has produced outstanding weight loss.     [name], MD [place]

## 2021-07-05 NOTE — Assessment & Plan Note (Signed)
Stabilized weight gain.  She is commited to getting back on her diet which has produced outstanding weight loss.

## 2021-07-05 NOTE — Assessment & Plan Note (Signed)
Mild acute situational worsening.  She has gone from care receiver to care giver.  Nice growth.

## 2021-07-12 ENCOUNTER — Ambulatory Visit: Payer: Medicaid Other

## 2021-07-12 ENCOUNTER — Other Ambulatory Visit: Payer: Self-pay | Admitting: Family Medicine

## 2021-07-12 ENCOUNTER — Other Ambulatory Visit: Payer: Self-pay

## 2021-07-12 DIAGNOSIS — J4540 Moderate persistent asthma, uncomplicated: Secondary | ICD-10-CM | POA: Diagnosis not present

## 2021-07-12 DIAGNOSIS — E7800 Pure hypercholesterolemia, unspecified: Secondary | ICD-10-CM

## 2021-07-18 ENCOUNTER — Ambulatory Visit (INDEPENDENT_AMBULATORY_CARE_PROVIDER_SITE_OTHER): Payer: Medicaid Other

## 2021-07-18 ENCOUNTER — Other Ambulatory Visit: Payer: Self-pay

## 2021-07-18 ENCOUNTER — Ambulatory Visit (INDEPENDENT_AMBULATORY_CARE_PROVIDER_SITE_OTHER): Payer: Medicaid Other | Admitting: Family Medicine

## 2021-07-18 ENCOUNTER — Encounter: Payer: Self-pay | Admitting: Family Medicine

## 2021-07-18 VITALS — BP 110/62 | HR 67 | Ht 64.0 in | Wt 257.4 lb

## 2021-07-18 DIAGNOSIS — H6983 Other specified disorders of Eustachian tube, bilateral: Secondary | ICD-10-CM

## 2021-07-18 DIAGNOSIS — N898 Other specified noninflammatory disorders of vagina: Secondary | ICD-10-CM

## 2021-07-18 DIAGNOSIS — Z23 Encounter for immunization: Secondary | ICD-10-CM | POA: Diagnosis not present

## 2021-07-18 DIAGNOSIS — L219 Seborrheic dermatitis, unspecified: Secondary | ICD-10-CM

## 2021-07-18 DIAGNOSIS — H6993 Unspecified Eustachian tube disorder, bilateral: Secondary | ICD-10-CM | POA: Insufficient documentation

## 2021-07-18 MED ORDER — TRIAMCINOLONE ACETONIDE 0.025 % EX OINT: TOPICAL_OINTMENT | Freq: Two times a day (BID) | CUTANEOUS | 5 refills | Status: DC

## 2021-07-18 MED ORDER — FLUTICASONE PROPIONATE 50 MCG/ACT NA SUSP: NASAL | 5 refills | Status: DC

## 2021-07-18 MED ORDER — FLUCONAZOLE 150 MG PO TABS: 150.0000 mg | ORAL_TABLET | Freq: Once | ORAL | 0 refills | Status: AC

## 2021-07-18 NOTE — Assessment & Plan Note (Signed)
Will treat presumptively as a yeast infection.

## 2021-07-18 NOTE — Assessment & Plan Note (Signed)
Refilled triamcinolone

## 2021-07-18 NOTE — Assessment & Plan Note (Signed)
Normal exam.  History strongly suggests eustacian tube dysfunction.  Encouraged her to use flonase regularly.

## 2021-07-18 NOTE — Progress Notes (Signed)
° ° °  SUBJECTIVE:   CHIEF COMPLAINT / HPI:   Bilateral ear pain.  Mild improvement with cortisporin otic.  Hx of nasal allergies.  Not using flonase regularly,  Does not feel as if she can pop her ears.  Has appointment to see ENT but not until 08/30/21.  Does not feel as if she has any dental problems. 2. Wants refill on cream to treat the rash at her hairline. 3. Has vaginal itching.  Want Rx for yeast. 4. Still gaining weight.  Moderately off her diet and not exercising.     OBJECTIVE:   BP 110/62    Pulse 67    Ht 5\' 4"  (1.626 m)    Wt 257 lb 6.4 oz (116.8 kg)    LMP 02/03/2019 Comment: Tubal Ligation   SpO2 98%    BMI 44.18 kg/m   TMs normal.  No pain to manipulation of pinna or tragus.  Canals not edematous Throat normal TMJs are normal, no clunk Neck no significant nodes.   Lungs clear Skin seborrheic dermatitis.  ASSESSMENT/PLAN:   Seborrheic eczema Refilled triamcinolone  Morbid obesity ([place]) She still is gaining weight.  When she was losing weight, she had the sense she was in charge of her life.  Now she is more passive with an external locus of control.  Encouraged her to take charge again.  Eustachian tube dysfunction, bilateral Normal exam.  History strongly suggests eustacian tube dysfunction.  Encouraged her to use flonase regularly.  Vaginal pruritus Will treat presumptively as a yeast infection.     Kelsey Resides, MD [place]

## 2021-07-18 NOTE — Patient Instructions (Addendum)
Only take cyclobenzaprene, not tizanadine.  You should not be on two muscle relaxers.   I cleaned up your med list some.  Please lok it over and let me know if there are more medications I can take off. I believe you have eustacian tube dysfunction.  Please use your flonas regularly. I sent in a pill for the itching and I refilled your ointment. Please get back on track with weight loss.   See me in 2 months - after the ENT doc.  Hopefully you will be feeling much better. I want to see how you are doing on the weight.

## 2021-07-18 NOTE — Assessment & Plan Note (Signed)
She still is gaining weight.  When she was losing weight, she had the sense she was in charge of her life.  Now she is more passive with an external locus of control.  Encouraged her to take charge again.

## 2021-08-09 ENCOUNTER — Other Ambulatory Visit: Payer: Self-pay

## 2021-08-09 ENCOUNTER — Ambulatory Visit: Payer: Medicaid Other | Admitting: *Deleted

## 2021-08-09 DIAGNOSIS — J4540 Moderate persistent asthma, uncomplicated: Secondary | ICD-10-CM

## 2021-08-15 ENCOUNTER — Ambulatory Visit: Payer: Medicaid Other

## 2021-08-15 ENCOUNTER — Ambulatory Visit (INDEPENDENT_AMBULATORY_CARE_PROVIDER_SITE_OTHER): Payer: Medicaid Other | Admitting: Family Medicine

## 2021-08-15 ENCOUNTER — Other Ambulatory Visit: Payer: Self-pay

## 2021-08-15 VITALS — BP 141/72 | HR 72 | Ht 64.0 in | Wt 262.2 lb

## 2021-08-15 DIAGNOSIS — M25562 Pain in left knee: Secondary | ICD-10-CM | POA: Diagnosis present

## 2021-08-15 DIAGNOSIS — H40023 Open angle with borderline findings, high risk, bilateral: Secondary | ICD-10-CM | POA: Diagnosis not present

## 2021-08-15 DIAGNOSIS — M1712 Unilateral primary osteoarthritis, left knee: Secondary | ICD-10-CM | POA: Diagnosis not present

## 2021-08-15 DIAGNOSIS — H47323 Drusen of optic disc, bilateral: Secondary | ICD-10-CM | POA: Diagnosis not present

## 2021-08-15 DIAGNOSIS — H1045 Other chronic allergic conjunctivitis: Secondary | ICD-10-CM | POA: Diagnosis not present

## 2021-08-15 DIAGNOSIS — H0102A Squamous blepharitis right eye, upper and lower eyelids: Secondary | ICD-10-CM | POA: Diagnosis not present

## 2021-08-15 DIAGNOSIS — L719 Rosacea, unspecified: Secondary | ICD-10-CM | POA: Diagnosis not present

## 2021-08-15 DIAGNOSIS — H40033 Anatomical narrow angle, bilateral: Secondary | ICD-10-CM | POA: Diagnosis not present

## 2021-08-15 DIAGNOSIS — H0102B Squamous blepharitis left eye, upper and lower eyelids: Secondary | ICD-10-CM | POA: Diagnosis not present

## 2021-08-15 MED ORDER — DICLOFENAC SODIUM 1 % EX GEL: 4.0000 g | Freq: Four times a day (QID) | CUTANEOUS | 0 refills | Status: AC

## 2021-08-15 NOTE — Progress Notes (Signed)
? ? ?  SUBJECTIVE:  ? ?CHIEF COMPLAINT / HPI:  ?Chief Complaint  ?Patient presents with  ? Knee Pain  ?  Left   ?  ?Patient reports pain in the left knee for about 1 week associated with a popping sensation.  She does report joint stiffness in the morning which can last up to 30 minutes.  She has not had any injuries recently but did have a fall in August 2022.  She did have x-rays of her knee previously which showed arthritis.  She is not taking any medicine for the pain currently.  She reports family history of rheumatoid arthritis and her mother and sister. ? ?PERTINENT  PMH / PSH: obesity, HFpEF, OA ? ?Patient Care Team: ?[name], MD as PCP - General ?Kennith Center, RD as Dietitian (Family Medicine)  ? ?OBJECTIVE:  ? ?BP (!) 141/72   Pulse 72   Ht '5\' 4"'$  (1.626 m)   Wt 262 lb 4 oz (119 kg)   LMP 02/03/2019 Comment: Tubal Ligation  SpO2 99%   BMI 45.02 kg/m?   ?Physical Exam ?Constitutional:   ?   General: She is not in acute distress. ?Cardiovascular:  ?   Rate and Rhythm: Normal rate.  ?Pulmonary:  ?   Effort: Pulmonary effort is normal. No respiratory distress.  ?Musculoskeletal:  ?   Cervical back: Neck supple.  ?   Comments: Bilateral knees without any obvious deformity or swelling.  No tenderness to palpation along joint line.  Crepitus appreciated with range of motion bilaterally otherwise no pain with range of motion.  Negative Lachman and posterior drawer.  No laxity with varus/valgus stressing.  Negative McMurray.  ?Neurological:  ?   Mental Status: She is alert.  ?  ? ?Depression screen [facility] 2/9 08/15/2021  ?Decreased Interest 0  ?Down, Depressed, Hopeless 0  ?PHQ - 2 Score 0  ?Altered sleeping 0  ?Tired, decreased energy 0  ?Change in appetite 0  ?Feeling bad or failure about yourself  0  ?Trouble concentrating 0  ?Moving slowly or fidgety/restless 0  ?Suicidal thoughts 0  ?PHQ-9 Score 0  ?Difficult doing work/chores Not difficult at all  ?Some recent data might be hidden  ?  ? ?{Show  previous vital signs (optional):23777} ? ? ? ?ASSESSMENT/PLAN:  ? ?Primary osteoarthritis of left knee ?Left knee pain ongoing for 1 week suspect likely consistent with flareup of OA.  She does have prior imaging of her knee which is consistent with OA. ?- diclofenac gel ?- home exercises given, can refer to PT if patient changes her mind ?  ? ?Return if symptoms worsen or fail to improve.  ? ?[name], MD ?[place]  ?

## 2021-08-15 NOTE — Assessment & Plan Note (Signed)
Left knee pain ongoing for 1 week suspect likely consistent with flareup of OA.  She does have prior imaging of her knee which is consistent with OA. ?- diclofenac gel ?- home exercises given, can refer to PT if patient changes her mind ?

## 2021-08-15 NOTE — Patient Instructions (Addendum)
It was nice seeing you today! ? ?Try doing knee exercises at home at least 4-5 times a week. ? ?Try Voltaren gel up to 4 times a day as needed. ? ?Stay well, ?[name], MD ?[place] ?[phone] ? ?-- ? ?Make sure to check out at the front desk before you leave today. ? ?Please arrive at least 15 minutes prior to your scheduled appointments. ? ?If you had blood work today, I will send you a MyChart message or a letter if results are normal. Otherwise, I will give you a call. ? ?If you had a referral placed, they will call you to set up an appointment. Please give [place] a call if you don't hear back in the next 2 weeks. ? ?If you need additional refills before your next appointment, please call your pharmacy first.  ?

## 2021-08-30 DIAGNOSIS — H938X3 Other specified disorders of ear, bilateral: Secondary | ICD-10-CM | POA: Diagnosis not present

## 2021-08-30 DIAGNOSIS — Z9889 Other specified postprocedural states: Secondary | ICD-10-CM | POA: Diagnosis not present

## 2021-08-30 DIAGNOSIS — J310 Chronic rhinitis: Secondary | ICD-10-CM | POA: Diagnosis not present

## 2021-08-30 DIAGNOSIS — Z9089 Acquired absence of other organs: Secondary | ICD-10-CM | POA: Diagnosis not present

## 2021-08-30 DIAGNOSIS — J45909 Unspecified asthma, uncomplicated: Secondary | ICD-10-CM | POA: Diagnosis not present

## 2021-09-05 ENCOUNTER — Ambulatory Visit: Payer: Self-pay

## 2021-09-05 ENCOUNTER — Ambulatory Visit (INDEPENDENT_AMBULATORY_CARE_PROVIDER_SITE_OTHER): Payer: Medicaid Other | Admitting: Family Medicine

## 2021-09-05 ENCOUNTER — Ambulatory Visit: Payer: Medicaid Other | Admitting: Family Medicine

## 2021-09-05 VITALS — BP 135/83 | HR 65 | Ht 64.0 in | Wt 261.2 lb

## 2021-09-05 VITALS — BP 146/73 | Ht 64.0 in | Wt 261.0 lb

## 2021-09-05 DIAGNOSIS — M79644 Pain in right finger(s): Secondary | ICD-10-CM | POA: Diagnosis not present

## 2021-09-05 DIAGNOSIS — M65311 Trigger thumb, right thumb: Secondary | ICD-10-CM

## 2021-09-05 NOTE — Progress Notes (Signed)
? ? ?  SUBJECTIVE:  ? ?CHIEF COMPLAINT / HPI:  ? ?Right thumb pain ?Patient reports that she has been having issues recently with her right thumb getting "stuck" in a certain position.  She reports that she will try to bend her thumb and it just will not move until it pops shut.  Reports that she has pain when this occurs as well.  Denies any injury to the thumb.  Denies any fever, erythema of the thumb, warmth.  Reports that she is to have issues with this in her pointer finger but it comes and goes. ? ? ? ?OBJECTIVE:  ? ?BP 135/83   Pulse 65   Ht '5\' 4"'$  (1.626 m)   Wt 261 lb 3.2 oz (118.5 kg)   LMP 02/03/2019 Comment: Tubal Ligation  SpO2 98%   BMI 44.83 kg/m?   ?General: Pleasant 61 year old female in no acute distress ?Cardiac: Regular rate ?Respiratory: Normal work of breathing, speaking in full sentences ?MSK: Patient with full range of motion of the right thumb but catch point noted consistent with trigger finger.  No erythema, swelling of the thumb, no tenderness to palpation appreciated ? ?ASSESSMENT/PLAN:  ? ?Trigger finger of right thumb ?History and physical exam consistent with trigger finger.  Referral placed to sports medicine for treatment of this trigger finger.  Return precautions given. ?  ? ? ?Gifford Shave, MD ?[place]  ? ?

## 2021-09-05 NOTE — Patient Instructions (Signed)
You have a trigger thumb. ?These are treated one of two ways: ?Splint (thumb spica, band-aid) except when washing, icing the area for 6-10 weeks plus topical voltaren gel in the area of the nodule of your thumb ?Injection ?The injection can be repeated once if it doesn't go away with the first injection. ?You also have mild tenosynovitis of the index finger - use the voltaren gel up to 4 times a day, ice the finger 3-4 times a day for 15 minutes at a time. ?Regardless of which option you choose, follow-up with me in 1 month to 6 weeks. ?

## 2021-09-05 NOTE — Patient Instructions (Signed)
It was a pleasure seeing you today.  Your thumb concerns are consistent with what is called trigger finger.  I have placed a referral for you to see our sports medicine doctors to evaluate you and treat you for this.  If you have any issues, questions concerns please call the clinic.  I hope you have a wonderful day! ? ? ?

## 2021-09-06 ENCOUNTER — Ambulatory Visit: Payer: Medicaid Other

## 2021-09-06 ENCOUNTER — Encounter: Payer: Self-pay | Admitting: Family Medicine

## 2021-09-06 DIAGNOSIS — J4540 Moderate persistent asthma, uncomplicated: Secondary | ICD-10-CM | POA: Diagnosis not present

## 2021-09-06 DIAGNOSIS — M65311 Trigger thumb, right thumb: Secondary | ICD-10-CM | POA: Insufficient documentation

## 2021-09-06 NOTE — Progress Notes (Signed)
PCP: [name], MD  Subjective:   HPI: Patient is a 61 y.o. female here for right hand pain.  Patient reports she has longstanding history of issues with right hand and wrist. She had carpal tunnel release on right. Current complaints are of catching of right thumb, pain into palm with this. Has to pry thumb open at times and this catching is painful. Also noted pain with flexion of her index finger, pain on volar side of this digit.  Past Medical History:  Diagnosis Date   Allergic rhinitis    Anemia    Anxiety    Arthritis    knees and back   Asthma    Complication of anesthesia    Congestive heart failure (CHF) (HCC)    Depression    Gall stone    GERD (gastroesophageal reflux disease)    Headache    migraines, stress headaches   History of kidney stones    Hypertension    Hyperthyroidism    during pregnancy   Pneumonia    PONV (postoperative nausea and vomiting)    Recurrent upper respiratory infection (URI)    Sleep apnea    can't use Cpap    Current Outpatient Medications on File Prior to Visit  Medication Sig Dispense Refill   albuterol (PROVENTIL) (2.5 MG/3ML) 0.083% nebulizer solution Take 3 mLs (2.5 mg total) by nebulization every 4 (four) hours as needed. USE 1 VIAL VIA NEBULIZER EVERY 4 TO 6 HOURS AS NEEDED FOR COUGH OR WHEEZING 225 mL 0   albuterol (VENTOLIN HFA) 108 (90 Base) MCG/ACT inhaler Inhale 2 puffs into the lungs every 4 (four) hours as needed. 8.5 g 1   Ascorbic Acid (VITAMIN C) 1000 MG tablet Take 1,000 mg by mouth daily.     atorvastatin (LIPITOR) 40 MG tablet TAKE 1 TABLET(40 MG) BY MOUTH AT BEDTIME 90 tablet 3   Bepotastine Besilate 1.5 % SOLN Place 1 drop into both eyes 2 (two) times daily.     calcium carbonate (TUMS - DOSED IN MG ELEMENTAL CALCIUM) 500 MG chewable tablet Chew 2-3 tablets by mouth daily as needed for indigestion or heartburn.     cetirizine (ZYRTEC) 10 MG tablet Take 1 tablet (10 mg total) by mouth 2 (two) times  daily as needed for allergies (Can take an extra dose for breakthrough symptoms). 60 tablet 5   Cholecalciferol (D3 VITAMIN PO) Take by mouth.     clonazePAM (KLONOPIN) 0.5 MG tablet TAKE 1 TABLET(0.5 MG) BY MOUTH AT BEDTIME 30 tablet 5   Cobalamin Combinations (B12 FOLATE PO) Take 1 tablet by mouth daily.     COLLAGEN PO Take 1 Scoop by mouth daily.     Cranberry 1000 MG CAPS Take 1,000 mg by mouth daily.     cromolyn (OPTICROM) 4 % ophthalmic solution 1 drop 4 (four) times daily.     cyclobenzaprine (FLEXERIL) 10 MG tablet Take 1 tablet (10 mg total) by mouth 3 (three) times daily as needed for muscle spasms. 30 tablet 3   diclofenac Sodium (VOLTAREN) 1 % GEL Apply 4 g topically 4 (four) times daily. 350 g 0   EPINEPHrine (EPIPEN 2-PAK) 0.3 mg/0.3 mL IJ SOAJ injection Inject 0.3 mg into the muscle once for 1 dose. 2 each 2   EPIPEN 2-PAK 0.3 MG/0.3ML SOAJ injection Inject 0.3 mg into the muscle once for 1 dose. 0.3 mL 1   EYSUVIS 0.25 % SUSP Apply 1 drop to eye 4 (four) times daily.  famotidine (PEPCID) 40 MG tablet Take 1 tablet (40 mg total) by mouth daily. 30 tablet 5   fluticasone (FLONASE) 50 MCG/ACT nasal spray SHAKE LIQUID AND USE 2 SPRAYS IN EACH NOSTRIL DAILY 16 g 5   ibuprofen (ADVIL) 600 MG tablet Take 1 tablet (600 mg total) by mouth every 6 (six) hours as needed. 30 tablet 0   linaclotide (LINZESS) 72 MCG capsule Take 144 mcg by mouth daily before breakfast.     mometasone-formoterol (DULERA) 200-5 MCG/ACT AERO Inhale 2 puffs into the lungs 2 (two) times daily. 13 g 5   montelukast (SINGULAIR) 10 MG tablet Take one tablet by mouth at bedtime 90 tablet 3   norethindrone (AYGESTIN) 5 MG tablet Take 2.5 mg by mouth at bedtime.     nystatin powder APPLY TOPICALLY TO THE AFFECTED AREA THREE TIMES DAILY 60 g 3   Olopatadine HCl (PAZEO) 0.7 % SOLN Place 1 drop into both eyes at bedtime. 2.5 mL 2   polyethylene glycol powder (GLYCOLAX/MIRALAX) 17 GM/SCOOP powder Take 17 g by mouth 2  (two) times daily as needed. 3350 g 5   Probiotic Product (PROBIOTIC DAILY PO) Take 1 capsule by mouth daily.     Pyridoxine HCl (B-6) 50 MG TABS Take 50 mg by mouth daily.     Tetrahydroz-Polyvinyl Al-Povid (EYE IRRITATION RELIEF OP) Place 1 spray into both eyes daily.     triamcinolone (KENALOG) 0.025 % ointment Apply topically 2 (two) times daily. 454 g 5   Vitamin E 100 units TABS Take 100 Units by mouth daily.     XOLAIR 150 MG injection INJECT 300 MG UNDER THE SKIN EVERY 28 DAYS 2 each 11   Current Facility-Administered Medications on File Prior to Visit  Medication Dose Route Frequency Provider Last Rate Last Admin   omalizumab [name]) injection 300 mg  300 mg Subcutaneous Q28 days Jiles Prows, MD   300 mg at 08/09/21 9147    Past Surgical History:  Procedure Laterality Date   ADENOIDECTOMY     APPLICATION OF WOUND VAC  82/95/6213   APPLICATION OF WOUND VAC N/A 03/18/2019   Procedure: Application Of Wound Vac;  Surgeon: Donnie Mesa, MD;  Location: [place];  Service: General;  Laterality: N/A;   BIOPSY  05/27/2018   Procedure: BIOPSY;  Surgeon: Mauri Pole, MD;  Location: [facility];  Service: Endoscopy;;   CESAREAN SECTION     COLONOSCOPY WITH PROPOFOL N/A 05/27/2018   Procedure: COLONOSCOPY WITH PROPOFOL;  Surgeon: Mauri Pole, MD;  Location: [facility];  Service: Endoscopy;  Laterality: N/A;   CYST EXCISION     EXPLORATORY LAPAROTOMY  02/03/2019   INGUINAL HERNIA REPAIR N/A 02/03/2019   Procedure: EXPLORATORY LAPAROTOMY WITH SMALL BOWEL RESECTION AND PRIMARY CLOSURE OF VENTRAL HERNIA;  Surgeon: Donnie Mesa, MD;  Location: [place];  Service: General;  Laterality: N/A;   INSERTION OF MESH N/A 11/25/2020   Procedure: INSERTION OF MESH;  Surgeon: Donnie Mesa, MD;  Location: [place];  Service: General;  Laterality: N/A;   OVARIAN CYST REMOVAL     POLYPECTOMY  05/27/2018   Procedure: POLYPECTOMY;  Surgeon: Mauri Pole, MD;  Location: [facility];   Service: Endoscopy;;   removal of gallstones     TONSILLECTOMY     TYMPANOSTOMY TUBE PLACEMENT     VENTRAL HERNIA REPAIR N/A 11/25/2020   Procedure: RECURRENT VENTRAL HERNIA REPAIR WITH MESH;  Surgeon: Donnie Mesa, MD;  Location: [place];  Service: General;  Laterality: N/A;  WOUND EXPLORATION  03/18/2019   abdominal   WOUND EXPLORATION N/A 03/18/2019   Procedure: ABDOMINAL WOUND EXPLORATION;  Surgeon: Donnie Mesa, MD;  Location: [place];  Service: General;  Laterality: N/A;    Allergies  Allergen Reactions   Amoxicillin Anaphylaxis and Swelling   Etodolac Anaphylaxis and Shortness Of Breath     lips swelled   Penicillins Anaphylaxis and Swelling    DID THE REACTION INVOLVE: Swelling of the face/tongue/throat, SOB, or low BP? Yes Sudden or severe rash/hives, skin peeling, or the inside of the mouth or nose? No Did it require medical treatment? No When did it last happen?      20 years ago If all above answers are "NO", may proceed with cephalosporin use.    Ampicillin Swelling   Augmentin [Amoxicillin-Pot Clavulanate] Swelling   Diclofenac Sodium     Unknown reaction    Pantoprazole Sodium     unspecified   Adhesive [Tape] Rash    BP (!) 146/73   Ht '5\' 4"'$  (1.626 m)   Wt 261 lb (118.4 kg)   LMP 02/03/2019 Comment: Tubal Ligation  BMI 44.80 kg/m       View : No data to display.              View : No data to display.              Objective:  Physical Exam:  Gen: NAD, comfortable in exam room  Right hand/wrist: No deformity, swelling, bruising, atrophy.  Small nodule palpated at A1 pulley of 1st digit. FROM with 5/5 strength 1st and 2nd digits.  Catching, pain with flexion at IP of 1st digit. Tenderness to palpation A1 pulley 1st digit, mildly through flexor tendon area of 2nd digit. NVI distally.   Limited MSK u/s right hand:  Thickening of A1 pulley 1st digit flexor pollicis longus.  No tendon tears visualized 1st or 2nd digit.  Mild increase in  fluid within flexor tendon sheath 2nd digit compared to 3rd consistent with mild tenosynovitis.  Assessment & Plan:  1. Right hand pain - primary issue trigger thumb 1st digit.  Discussed splinting vs injection and she would like to try splinting first.  Thumb spica brace, band-aid splinting of IP.  Voltaren gel topically (this is listed as allergy in chart but she has been using it without issues).  Consider injection if not improving.  Icing, voltaren gel of right index finger as well.  F/u in 1 month to 6 weeks.

## 2021-09-06 NOTE — Assessment & Plan Note (Signed)
History and physical exam consistent with trigger finger.  Referral placed to sports medicine for treatment of this trigger finger.  Return precautions given. ?

## 2021-09-20 ENCOUNTER — Ambulatory Visit (INDEPENDENT_AMBULATORY_CARE_PROVIDER_SITE_OTHER): Payer: Medicaid Other | Admitting: Allergy and Immunology

## 2021-09-20 ENCOUNTER — Other Ambulatory Visit: Payer: Self-pay | Admitting: *Deleted

## 2021-09-20 ENCOUNTER — Encounter: Payer: Self-pay | Admitting: Allergy and Immunology

## 2021-09-20 VITALS — BP 110/60 | HR 62 | Temp 98.5°F | Resp 16 | Ht 63.25 in | Wt 262.6 lb

## 2021-09-20 DIAGNOSIS — M5410 Radiculopathy, site unspecified: Secondary | ICD-10-CM

## 2021-09-20 DIAGNOSIS — H6981 Other specified disorders of Eustachian tube, right ear: Secondary | ICD-10-CM | POA: Diagnosis not present

## 2021-09-20 DIAGNOSIS — J4550 Severe persistent asthma, uncomplicated: Secondary | ICD-10-CM | POA: Diagnosis not present

## 2021-09-20 DIAGNOSIS — K219 Gastro-esophageal reflux disease without esophagitis: Secondary | ICD-10-CM | POA: Diagnosis not present

## 2021-09-20 DIAGNOSIS — J45901 Unspecified asthma with (acute) exacerbation: Secondary | ICD-10-CM

## 2021-09-20 DIAGNOSIS — H6991 Unspecified Eustachian tube disorder, right ear: Secondary | ICD-10-CM

## 2021-09-20 DIAGNOSIS — J3089 Other allergic rhinitis: Secondary | ICD-10-CM

## 2021-09-20 MED ORDER — PAZEO 0.7 % OP SOLN: 1.0000 [drp] | Freq: Every day | OPHTHALMIC | 2 refills | Status: DC

## 2021-09-20 MED ORDER — ALBUTEROL SULFATE (2.5 MG/3ML) 0.083% IN NEBU: 2.5000 mg | INHALATION_SOLUTION | RESPIRATORY_TRACT | 1 refills | Status: DC | PRN

## 2021-09-20 MED ORDER — EPINEPHRINE 0.3 MG/0.3ML IJ SOAJ: 0.3000 mg | INTRAMUSCULAR | 1 refills | Status: DC | PRN

## 2021-09-20 MED ORDER — EPIPEN 2-PAK 0.3 MG/0.3ML IJ SOAJ: 0.3000 mg | INTRAMUSCULAR | 1 refills | Status: DC | PRN

## 2021-09-20 MED ORDER — CETIRIZINE HCL 10 MG PO TABS: 10.0000 mg | ORAL_TABLET | Freq: Two times a day (BID) | ORAL | 5 refills | Status: DC | PRN

## 2021-09-20 MED ORDER — ALBUTEROL SULFATE HFA 108 (90 BASE) MCG/ACT IN AERS
2.0000 | INHALATION_SPRAY | RESPIRATORY_TRACT | 1 refills | Status: DC | PRN
Start: 2021-09-20 — End: 2022-03-28

## 2021-09-20 MED ORDER — FLUTICASONE PROPIONATE 50 MCG/ACT NA SUSP
NASAL | 5 refills | Status: DC
Start: 2021-09-20 — End: 2022-03-28

## 2021-09-20 MED ORDER — DULERA 200-5 MCG/ACT IN AERO: 2.0000 | INHALATION_SPRAY | Freq: Two times a day (BID) | RESPIRATORY_TRACT | 5 refills | Status: DC

## 2021-09-20 MED ORDER — FAMOTIDINE 40 MG PO TABS: 40.0000 mg | ORAL_TABLET | Freq: Every day | ORAL | 5 refills | Status: DC

## 2021-09-20 NOTE — Progress Notes (Signed)
[place]   Follow-up Note  Referring Provider: [name], MD Primary Provider: [name], MD Date of Office Visit: 09/20/2021  Subjective:   Kelsey Bullock (DOB: Jan 07, 1961) is a 60 y.o. female who returns to the [facility] on 09/20/2021 in re-evaluation of the following:  HPI: Kelsey Bullock returns to this clinic in evaluation of asthma, allergic rhinitis, suspected obesity hypoventilation syndrome, and reflux.  I last saw her in this clinic on 22 March 2021.  Overall her asthma has been under excellent control and she has not required a systemic steroid to treat an exacerbation and she rarely uses a short acting bronchodilator while she continues on omalizumab injections.  She no longer uses any other type of controller agent for her asthma.  Her nose has been intermittently stuffy.  She does not use nasal steroids on a consistent basis.  She did see Dr. Wilburn Cornelia, ENT, in evaluation of her eustachian tube dysfunction affecting her right ear and he recommended that she consistently use Flonase but she does not do so on a regular basis.  She cannot really smell that well or taste that well ever since her previous COVID infection in 2020.  Occasionally she will use nasal ipratropium for some drainage.  Her reflux is under very good control with intermittent use of famotidine.  She relates a history of having toe pain on her right foot affecting toe 3 4 and 5 that shoots up sometimes to behind her knee.  She has not seen anyone about this issue.  She is working through an issue presently with a trigger finger affecting her right thumb.  Allergies as of 09/20/2021       Reactions   Amoxicillin Anaphylaxis, Swelling   Etodolac Anaphylaxis, Shortness Of Breath    lips swelled  lips swelled  lips swelled   Penicillins Anaphylaxis, Swelling, Rash   DID THE REACTION INVOLVE: Swelling of the face/tongue/throat,  SOB, or low BP? Yes Sudden or severe rash/hives, skin peeling, or the inside of the mouth or nose? No Did it require medical treatment? No When did it last happen?      20 years ago If all above answers are "NO", may proceed with cephalosporin use.   Latex Rash, Hives   Pantoprazole Sodium Hives   unspecified REACTION: unspecified   Ampicillin Swelling   Augmentin [amoxicillin-pot Clavulanate] Swelling   Diclofenac Sodium    Unknown reaction    Pantoprazole Sodium    unspecified   Adhesive [tape] Rash        Medication List    albuterol (2.5 MG/3ML) 0.083% nebulizer solution Commonly known as: PROVENTIL Take 3 mLs (2.5 mg total) by nebulization every 4 (four) hours as needed. USE 1 VIAL VIA NEBULIZER EVERY 4 TO 6 HOURS AS NEEDED FOR COUGH OR WHEEZING   albuterol 108 (90 Base) MCG/ACT inhaler Commonly known as: VENTOLIN HFA Inhale 2 puffs into the lungs every 4 (four) hours as needed.   atorvastatin 40 MG tablet Commonly known as: LIPITOR TAKE 1 TABLET(40 MG) BY MOUTH AT BEDTIME   B-6 50 MG Tabs Take 50 mg by mouth daily.   B12 FOLATE PO Take 1 tablet by mouth daily.   Bepotastine Besilate 1.5 % Soln Place 1 drop into both eyes 2 (two) times daily.   calcium carbonate 500 MG chewable tablet Commonly known as: TUMS - dosed in mg elemental calcium Chew 2-3 tablets by mouth daily as  needed for indigestion or heartburn.   cetirizine 10 MG tablet Commonly known as: ZYRTEC Take 1 tablet (10 mg total) by mouth 2 (two) times daily as needed for allergies (Can take an extra dose for breakthrough symptoms).   clonazePAM 0.5 MG tablet Commonly known as: KLONOPIN TAKE 1 TABLET(0.5 MG) BY MOUTH AT BEDTIME   COLLAGEN PO Take 1 Scoop by mouth daily.   Cranberry 1000 MG Caps Take 1,000 mg by mouth daily.   cromolyn 4 % ophthalmic solution Commonly known as: OPTICROM 1 drop 4 (four) times daily.   cyclobenzaprine 10 MG tablet Commonly known as: FLEXERIL Take 1 tablet  (10 mg total) by mouth 3 (three) times daily as needed for muscle spasms.   D3 VITAMIN PO Take by mouth.   diclofenac Sodium 1 % Gel Commonly known as: VOLTAREN Apply 4 g topically 4 (four) times daily.   Dulera 200-5 MCG/ACT Aero Generic drug: mometasone-formoterol Inhale 2 puffs into the lungs 2 (two) times daily.   EPINEPHrine 0.3 mg/0.3 mL Soaj injection Commonly known as: EpiPen 2-Pak Inject 0.3 mg into the muscle once for 1 dose.   famotidine 40 MG tablet Commonly known as: PEPCID Take 1 tablet (40 mg total) by mouth daily.   fluticasone 50 MCG/ACT nasal spray Commonly known as: FLONASE SHAKE LIQUID AND USE 2 SPRAYS IN EACH NOSTRIL DAILY   ibuprofen 600 MG tablet Commonly known as: ADVIL Take 1 tablet (600 mg total) by mouth every 6 (six) hours as needed.   linaclotide 72 MCG capsule Commonly known as: LINZESS Take 144 mcg by mouth daily before breakfast.   montelukast 10 MG tablet Commonly known as: SINGULAIR Take one tablet by mouth at bedtime   norethindrone 5 MG tablet Commonly known as: AYGESTIN Take 2.5 mg by mouth at bedtime.   nystatin powder Commonly known as: nystatin APPLY TOPICALLY TO THE AFFECTED AREA THREE TIMES DAILY   Pazeo 0.7 % Soln Generic drug: Olopatadine HCl Place 1 drop into both eyes at bedtime.   polyethylene glycol powder 17 GM/SCOOP powder Commonly known as: GLYCOLAX/MIRALAX Take 17 g by mouth 2 (two) times daily as needed.   PROBIOTIC DAILY PO Take 1 capsule by mouth daily.   triamcinolone 0.025 % ointment Commonly known as: KENALOG Apply topically 2 (two) times daily.   vitamin C 1000 MG tablet Take 1,000 mg by mouth daily.   Vitamin E 100 units Tabs Take 100 Units by mouth daily.   Xolair 150 MG injection Generic drug: omalizumab INJECT 300 MG UNDER THE SKIN EVERY 28 DAYS    Past Medical History:  Diagnosis Date   Allergic rhinitis    Anemia    Anxiety    Arthritis    knees and back   Asthma     Complication of anesthesia    Congestive heart failure (CHF) (HCC)    Depression    Gall stone    GERD (gastroesophageal reflux disease)    Headache    migraines, stress headaches   History of kidney stones    Hypertension    Hyperthyroidism    during pregnancy   Pneumonia    PONV (postoperative nausea and vomiting)    Recurrent upper respiratory infection (URI)    Sleep apnea    can't use Cpap    Past Surgical History:  Procedure Laterality Date   ADENOIDECTOMY     APPLICATION OF WOUND VAC  14/48/1856   APPLICATION OF WOUND VAC N/A 03/18/2019   Procedure: Application Of Wound Vac;  Surgeon: Donnie Mesa, MD;  Location: [place];  Service: General;  Laterality: N/A;   BIOPSY  05/27/2018   Procedure: BIOPSY;  Surgeon: Mauri Pole, MD;  Location: [facility];  Service: Endoscopy;;   CESAREAN SECTION     COLONOSCOPY WITH PROPOFOL N/A 05/27/2018   Procedure: COLONOSCOPY WITH PROPOFOL;  Surgeon: Mauri Pole, MD;  Location: [facility];  Service: Endoscopy;  Laterality: N/A;   CYST EXCISION     EXPLORATORY LAPAROTOMY  02/03/2019   INGUINAL HERNIA REPAIR N/A 02/03/2019   Procedure: EXPLORATORY LAPAROTOMY WITH SMALL BOWEL RESECTION AND PRIMARY CLOSURE OF VENTRAL HERNIA;  Surgeon: Donnie Mesa, MD;  Location: [place];  Service: General;  Laterality: N/A;   INSERTION OF MESH N/A 11/25/2020   Procedure: INSERTION OF MESH;  Surgeon: Donnie Mesa, MD;  Location: [place];  Service: General;  Laterality: N/A;   OVARIAN CYST REMOVAL     POLYPECTOMY  05/27/2018   Procedure: POLYPECTOMY;  Surgeon: Mauri Pole, MD;  Location: [facility];  Service: Endoscopy;;   removal of gallstones     TONSILLECTOMY     TYMPANOSTOMY TUBE PLACEMENT     VENTRAL HERNIA REPAIR N/A 11/25/2020   Procedure: RECURRENT VENTRAL HERNIA REPAIR WITH MESH;  Surgeon: Donnie Mesa, MD;  Location: [place];  Service: General;  Laterality: N/A;   WOUND EXPLORATION  03/18/2019   abdominal   WOUND  EXPLORATION N/A 03/18/2019   Procedure: ABDOMINAL WOUND EXPLORATION;  Surgeon: Donnie Mesa, MD;  Location: [place];  Service: General;  Laterality: N/A;    Review of systems negative except as noted in HPI / PMHx or noted below:  Review of Systems  Constitutional: Negative.   HENT: Negative.    Eyes: Negative.   Respiratory: Negative.    Cardiovascular: Negative.   Gastrointestinal: Negative.   Genitourinary: Negative.   Musculoskeletal: Negative.   Skin: Negative.   Neurological: Negative.   Endo/Heme/Allergies: Negative.   Psychiatric/Behavioral: Negative.      Objective:   Vitals:   09/20/21 1008  BP: 110/60  Pulse: 62  Resp: 16  Temp: 98.5 F (36.9 C)  SpO2: 98%   Height: 5' 3.25" (160.7 cm)  Weight: 262 lb 9.6 oz (119.1 kg)   Physical Exam Constitutional:      Appearance: She is not diaphoretic.  HENT:     Head: Normocephalic.     Right Ear: Tympanic membrane, ear canal and external ear normal.     Left Ear: Tympanic membrane, ear canal and external ear normal.     Nose: Nose normal. No mucosal edema or rhinorrhea.     Mouth/Throat:     Pharynx: Uvula midline. No oropharyngeal exudate.  Eyes:     Conjunctiva/sclera: Conjunctivae normal.  Neck:     Thyroid: No thyromegaly.     Trachea: Trachea normal. No tracheal tenderness or tracheal deviation.  Cardiovascular:     Rate and Rhythm: Normal rate and regular rhythm.     Heart sounds: Normal heart sounds, S1 normal and S2 normal. No murmur heard. Pulmonary:     Effort: No respiratory distress.     Breath sounds: Normal breath sounds. No stridor. No wheezing or rales.  Lymphadenopathy:     Head:     Right side of head: No tonsillar adenopathy.     Left side of head: No tonsillar adenopathy.     Cervical: No cervical adenopathy.  Skin:    Findings: No erythema or rash.     Nails: There is no clubbing.  Neurological:     Mental Status: She is alert.    Diagnostics:    Spirometry was performed and  demonstrated an FEV1 of 1.80 at 74 % of predicted.   Assessment and Plan:   1. Asthma, severe persistent, well-controlled   2. Asthma with acute exacerbation, unspecified asthma severity, unspecified whether persistent   3. Other allergic rhinitis   4. LPRD (laryngopharyngeal reflux disease)   5. Dysfunction of right eustachian tube   6. Radiculopathy with lower extremity symptoms      1. Continue Flonase 1 spray each nostril 1-7 times per week depending on upper airway and ear symptom activity  2. Continue montelukast 10 mg daily  3. Continue Xolair (Epi-Pen)  4.  If needed:   A. Pro-air HFA  B. Antihistamine   C. Famotidine 20 mg daily  E. Nasal ipratropium 0.06% - 2 sprays each nostril every 6 hours  F. Dulera 200 -2 inhalations twice a day  5. Return to clinic in 6 months or earlier if problem  Overall Lella has done very well with her asthma while using omalizumab as her major controller agent.  She does have some congestion of her upper airway and her eustachian tube and I did have a talk with her today about the use of Flonase in conjunction with her montelukast to address this issue.  She has a selection of other agents that she can utilize should they be required as noted above.  She does appear to have a history consistent with a right lower extremity radiculopathy and neuropathy I have asked her to follow-up with her primary care doctor regarding further management of this issue.  I will see her back in this clinic in 6 months or earlier if there is a problem.  Allena Katz, MD [facility]

## 2021-09-20 NOTE — Patient Instructions (Addendum)
?  1. Continue Flonase 1 spray each nostril 1-7 times per week depending on upper airway and ear symptom activity ? ?2. Continue montelukast 10 mg daily ? ?3. Continue Xolair (Epi-Pen) ? ?4.  If needed: ? ? A. Pro-air HFA ? B. Antihistamine  ? C. Famotidine 20 mg daily ? E. Nasal ipratropium 0.06% - 2 sprays each nostril every 6 hours ? F. Dulera 200 -2 inhalations twice a day ? ?5. Return to clinic in 6 months or earlier if problem ? ?  ? ? ?

## 2021-09-21 ENCOUNTER — Encounter: Payer: Self-pay | Admitting: Allergy and Immunology

## 2021-10-04 ENCOUNTER — Ambulatory Visit: Payer: Medicaid Other

## 2021-10-04 DIAGNOSIS — J4550 Severe persistent asthma, uncomplicated: Secondary | ICD-10-CM | POA: Diagnosis not present

## 2021-10-06 DIAGNOSIS — Z5982 Transportation insecurity: Secondary | ICD-10-CM | POA: Diagnosis not present

## 2021-10-06 DIAGNOSIS — Z6841 Body Mass Index (BMI) 40.0 and over, adult: Secondary | ICD-10-CM | POA: Diagnosis not present

## 2021-10-06 DIAGNOSIS — R1030 Lower abdominal pain, unspecified: Secondary | ICD-10-CM | POA: Diagnosis not present

## 2021-10-06 DIAGNOSIS — Z0000 Encounter for general adult medical examination without abnormal findings: Secondary | ICD-10-CM | POA: Diagnosis not present

## 2021-10-17 ENCOUNTER — Encounter: Payer: Self-pay | Admitting: Family Medicine

## 2021-10-17 ENCOUNTER — Ambulatory Visit: Payer: Medicaid Other | Admitting: Family Medicine

## 2021-10-17 VITALS — BP 141/68 | Ht 64.0 in | Wt 261.0 lb

## 2021-10-17 DIAGNOSIS — M79644 Pain in right finger(s): Secondary | ICD-10-CM | POA: Diagnosis not present

## 2021-10-17 DIAGNOSIS — G629 Polyneuropathy, unspecified: Secondary | ICD-10-CM

## 2021-10-17 DIAGNOSIS — R202 Paresthesia of skin: Secondary | ICD-10-CM

## 2021-10-17 DIAGNOSIS — R200 Anesthesia of skin: Secondary | ICD-10-CM | POA: Diagnosis not present

## 2021-10-17 MED ORDER — METHYLPREDNISOLONE ACETATE 40 MG/ML IJ SUSP
20.0000 mg | Freq: Once | INTRAMUSCULAR | Status: AC
  Administered 2021-10-17: 20 mg via INTRA_ARTICULAR

## 2021-10-17 NOTE — Progress Notes (Signed)
PCP: [name], MD  Subjective:   HPI: Patient is a 61 y.o. female here for right hand pain.  4/3: Patient reports she has longstanding history of issues with right hand and wrist. She had carpal tunnel release on right. Current complaints are of catching of right thumb, pain into palm with this. Has to pry thumb open at times and this catching is painful. Also noted pain with flexion of her index finger, pain on volar side of this digit.  5/15: Patient reports continued pain in right thumb. Notable catching, radiation up forearm. Has been using thumb spica splint and band-aid splint without much improvement. She's taking tylenol, ibuprofen. Interested in injection. Also reports she's lost a significant amount of weight due to other medical issues. Started to get numbness and tingling in both feet, digits as well as some in fingers of both hands. She had diabetes previously but states this improved.  Past Medical History:  Diagnosis Date   Allergic rhinitis    Anemia    Anxiety    Arthritis    knees and back   Asthma    Complication of anesthesia    Congestive heart failure (CHF) (HCC)    Depression    Gall stone    GERD (gastroesophageal reflux disease)    Headache    migraines, stress headaches   History of kidney stones    Hypertension    Hyperthyroidism    during pregnancy   Pneumonia    PONV (postoperative nausea and vomiting)    Recurrent upper respiratory infection (URI)    Sleep apnea    can't use Cpap    Current Outpatient Medications on File Prior to Visit  Medication Sig Dispense Refill   albuterol (PROVENTIL) (2.5 MG/3ML) 0.083% nebulizer solution Take 3 mLs (2.5 mg total) by nebulization every 4 (four) hours as needed. USE 1 VIAL VIA NEBULIZER EVERY 4 TO 6 HOURS AS NEEDED FOR COUGH OR WHEEZING 225 mL 1   albuterol (VENTOLIN HFA) 108 (90 Base) MCG/ACT inhaler Inhale 2 puffs into the lungs every 4 (four) hours as needed. 18 g 1   Ascorbic Acid  (VITAMIN C) 1000 MG tablet Take 1,000 mg by mouth daily.     atorvastatin (LIPITOR) 40 MG tablet TAKE 1 TABLET(40 MG) BY MOUTH AT BEDTIME (Patient not taking: Reported on 09/20/2021) 90 tablet 3   Bepotastine Besilate 1.5 % SOLN Place 1 drop into both eyes 2 (two) times daily.     calcium carbonate (TUMS - DOSED IN MG ELEMENTAL CALCIUM) 500 MG chewable tablet Chew 2-3 tablets by mouth daily as needed for indigestion or heartburn.     cetirizine (ZYRTEC) 10 MG tablet Take 1 tablet (10 mg total) by mouth 2 (two) times daily as needed for allergies (Can take an extra dose for breakthrough symptoms). 60 tablet 5   Cholecalciferol (D3 VITAMIN PO) Take by mouth.     clonazePAM (KLONOPIN) 0.5 MG tablet TAKE 1 TABLET(0.5 MG) BY MOUTH AT BEDTIME 30 tablet 5   Cobalamin Combinations (B12 FOLATE PO) Take 1 tablet by mouth daily.     COLLAGEN PO Take 1 Scoop by mouth daily.     Cranberry 1000 MG CAPS Take 1,000 mg by mouth daily.     cromolyn (OPTICROM) 4 % ophthalmic solution 1 drop 4 (four) times daily.     cyclobenzaprine (FLEXERIL) 10 MG tablet Take 1 tablet (10 mg total) by mouth 3 (three) times daily as needed for muscle spasms. 30 tablet 3  diclofenac Sodium (VOLTAREN) 1 % GEL Apply 4 g topically 4 (four) times daily. 350 g 0   EPIPEN 2-PAK 0.3 MG/0.3ML SOAJ injection Inject 0.3 mg into the muscle as needed for anaphylaxis. 1 each 1   famotidine (PEPCID) 40 MG tablet Take 1 tablet (40 mg total) by mouth daily. 30 tablet 5   fluticasone (FLONASE) 50 MCG/ACT nasal spray SHAKE LIQUID AND USE 2 SPRAYS IN EACH NOSTRIL DAILY 16 g 5   ibuprofen (ADVIL) 600 MG tablet Take 1 tablet (600 mg total) by mouth every 6 (six) hours as needed. 30 tablet 0   linaclotide (LINZESS) 72 MCG capsule Take 144 mcg by mouth daily before breakfast.     mometasone-formoterol (DULERA) 200-5 MCG/ACT AERO Inhale 2 puffs into the lungs 2 (two) times daily. 13 g 5   montelukast (SINGULAIR) 10 MG tablet Take one tablet by mouth at  bedtime 90 tablet 3   norethindrone (AYGESTIN) 5 MG tablet Take 2.5 mg by mouth at bedtime.     nystatin powder APPLY TOPICALLY TO THE AFFECTED AREA THREE TIMES DAILY 60 g 3   Olopatadine HCl (PAZEO) 0.7 % SOLN Place 1 drop into both eyes at bedtime. 2.5 mL 2   polyethylene glycol powder (GLYCOLAX/MIRALAX) 17 GM/SCOOP powder Take 17 g by mouth 2 (two) times daily as needed. 3350 g 5   Probiotic Product (PROBIOTIC DAILY PO) Take 1 capsule by mouth daily.     Pyridoxine HCl (B-6) 50 MG TABS Take 50 mg by mouth daily.     triamcinolone (KENALOG) 0.025 % ointment Apply topically 2 (two) times daily. 454 g 5   Vitamin E 100 units TABS Take 100 Units by mouth daily.     XOLAIR 150 MG injection INJECT 300 MG UNDER THE SKIN EVERY 28 DAYS 2 each 11   Current Facility-Administered Medications on File Prior to Visit  Medication Dose Route Frequency Provider Last Rate Last Admin   omalizumab [name]) injection 300 mg  300 mg Subcutaneous Q28 days Jiles Prows, MD   300 mg at 10/04/21 0856    Past Surgical History:  Procedure Laterality Date   ADENOIDECTOMY     APPLICATION OF WOUND VAC  50/53/9767   APPLICATION OF WOUND VAC N/A 03/18/2019   Procedure: Application Of Wound Vac;  Surgeon: Donnie Mesa, MD;  Location: [facility] [facility];  Service: General;  Laterality: N/A;   BIOPSY  05/27/2018   Procedure: BIOPSY;  Surgeon: Mauri Pole, MD;  Location: [facility];  Service: Endoscopy;;   CESAREAN SECTION     COLONOSCOPY WITH PROPOFOL N/A 05/27/2018   Procedure: COLONOSCOPY WITH PROPOFOL;  Surgeon: Mauri Pole, MD;  Location: [facility];  Service: Endoscopy;  Laterality: N/A;   CYST EXCISION     EXPLORATORY LAPAROTOMY  02/03/2019   INGUINAL HERNIA REPAIR N/A 02/03/2019   Procedure: EXPLORATORY LAPAROTOMY WITH SMALL BOWEL RESECTION AND PRIMARY CLOSURE OF VENTRAL HERNIA;  Surgeon: Donnie Mesa, MD;  Location: [place];  Service: General;  Laterality: N/A;   INSERTION OF MESH N/A 11/25/2020    Procedure: INSERTION OF MESH;  Surgeon: Donnie Mesa, MD;  Location: [place];  Service: General;  Laterality: N/A;   OVARIAN CYST REMOVAL     POLYPECTOMY  05/27/2018   Procedure: POLYPECTOMY;  Surgeon: Mauri Pole, MD;  Location: [facility];  Service: Endoscopy;;   removal of gallstones     TONSILLECTOMY     TYMPANOSTOMY TUBE PLACEMENT     VENTRAL HERNIA REPAIR N/A 11/25/2020  Procedure: RECURRENT VENTRAL HERNIA REPAIR WITH MESH;  Surgeon: Donnie Mesa, MD;  Location: [place];  Service: General;  Laterality: N/A;   WOUND EXPLORATION  03/18/2019   abdominal   WOUND EXPLORATION N/A 03/18/2019   Procedure: ABDOMINAL WOUND EXPLORATION;  Surgeon: Donnie Mesa, MD;  Location: [place];  Service: General;  Laterality: N/A;    Allergies  Allergen Reactions   Amoxicillin Anaphylaxis and Swelling   Etodolac Anaphylaxis and Shortness Of Breath     lips swelled  lips swelled  lips swelled   Penicillins Anaphylaxis, Swelling and Rash    DID THE REACTION INVOLVE: Swelling of the face/tongue/throat, SOB, or low BP? Yes Sudden or severe rash/hives, skin peeling, or the inside of the mouth or nose? No Did it require medical treatment? No When did it last happen?      20 years ago If all above answers are "NO", may proceed with cephalosporin use.    Latex Rash and Hives   Pantoprazole Sodium Hives    unspecified REACTION: unspecified   Ampicillin Swelling   Augmentin [Amoxicillin-Pot Clavulanate] Swelling   Diclofenac Sodium     Unknown reaction    Pantoprazole Sodium     unspecified   Adhesive [Tape] Rash    BP (!) 141/68   Ht '5\' 4"'$  (1.626 m)   Wt 261 lb (118.4 kg)   LMP 02/03/2019 Comment: Tubal Ligation  BMI 44.80 kg/m       View : No data to display.              View : No data to display.              Objective:  Physical Exam:  Gen: NAD, comfortable in exam room  Right hand/wrist: No deformity, swelling, bruising, atrophy.  Nodule A1 pulley of 1st  digit. FROM with 5/5 strength all digits.  Catching, pain with flexion of IP 1st digit. Tenderness to palpation A1 pulley location of 1st digit. Negative finkelsteins.  Mild positive tinels.  Assessment & Plan:  1. Right hand pain - primary issue trigger thumb 1st digit.  Not improving with splinting - went ahead with injection today as noted below.  Continue band-aid splinting for this week.  Voltaren gel topically.    After informed written consent timeout was performed.  Patient was seated in chair in exam room.  Area overlying A1 pulley right 1st digit prepped with alcohol swabs then utilizing ultrasound guidance, patients right trigger thumb injected with 0.5:0.81m lidocaine: depomedrol.  Patient tolerated procedure well without immediate complications.  2. Numbness - noted in bilateral lower and upper extremities.  Associated with notable weight loss and Gi issues.  Will check B12, BMP, A1c.

## 2021-10-18 ENCOUNTER — Other Ambulatory Visit: Payer: Self-pay

## 2021-10-18 DIAGNOSIS — G629 Polyneuropathy, unspecified: Secondary | ICD-10-CM

## 2021-10-18 LAB — BASIC METABOLIC PANEL
BUN/Creatinine Ratio: 19 (ref 9–23)
BUN: 12 mg/dL (ref 6–24)
CO2: 24 mmol/L (ref 20–29)
Calcium: 10.1 mg/dL (ref 8.7–10.2)
Chloride: 100 mmol/L (ref 96–106)
Creatinine, Ser: 0.62 mg/dL (ref 0.57–1.00)
Glucose: 92 mg/dL (ref 70–99)
Potassium: 5.4 mmol/L — ABNORMAL HIGH (ref 3.5–5.2)
Sodium: 139 mmol/L (ref 134–144)
eGFR: 103 mL/min/{1.73_m2} (ref >59)

## 2021-10-18 LAB — HEMOGLOBIN A1C
Est. average glucose Bld gHb Est-mCnc: 111 mg/dL
Hgb A1c MFr Bld: 5.5 % (ref 4.8–5.6)

## 2021-10-18 LAB — VITAMIN B12: Vitamin B-12: 514 pg/mL (ref 232–1245)

## 2021-10-19 DIAGNOSIS — G629 Polyneuropathy, unspecified: Secondary | ICD-10-CM | POA: Diagnosis not present

## 2021-10-20 LAB — BASIC METABOLIC PANEL
BUN/Creatinine Ratio: 13 (ref 9–23)
BUN: 9 mg/dL (ref 6–24)
CO2: 23 mmol/L (ref 20–29)
Calcium: 9.5 mg/dL (ref 8.7–10.2)
Chloride: 100 mmol/L (ref 96–106)
Creatinine, Ser: 0.69 mg/dL (ref 0.57–1.00)
Glucose: 90 mg/dL (ref 70–99)
Potassium: 4.4 mmol/L (ref 3.5–5.2)
Sodium: 139 mmol/L (ref 134–144)
eGFR: 100 mL/min/{1.73_m2} (ref >59)

## 2021-11-01 ENCOUNTER — Ambulatory Visit (INDEPENDENT_AMBULATORY_CARE_PROVIDER_SITE_OTHER): Payer: Medicaid Other

## 2021-11-01 DIAGNOSIS — J4550 Severe persistent asthma, uncomplicated: Secondary | ICD-10-CM | POA: Diagnosis not present

## 2021-11-21 ENCOUNTER — Other Ambulatory Visit: Payer: Self-pay | Admitting: Allergy and Immunology

## 2021-11-29 ENCOUNTER — Ambulatory Visit: Payer: Medicaid Other

## 2021-11-29 DIAGNOSIS — J4550 Severe persistent asthma, uncomplicated: Secondary | ICD-10-CM | POA: Diagnosis not present

## 2021-12-13 DIAGNOSIS — F411 Generalized anxiety disorder: Secondary | ICD-10-CM | POA: Insufficient documentation

## 2021-12-13 DIAGNOSIS — Z79899 Other long term (current) drug therapy: Secondary | ICD-10-CM | POA: Diagnosis not present

## 2021-12-13 DIAGNOSIS — F32A Depression, unspecified: Secondary | ICD-10-CM | POA: Diagnosis not present

## 2021-12-26 ENCOUNTER — Ambulatory Visit (INDEPENDENT_AMBULATORY_CARE_PROVIDER_SITE_OTHER): Payer: Medicaid Other

## 2021-12-26 DIAGNOSIS — J4550 Severe persistent asthma, uncomplicated: Secondary | ICD-10-CM

## 2022-01-19 ENCOUNTER — Other Ambulatory Visit: Payer: Self-pay | Admitting: Family Medicine

## 2022-01-19 DIAGNOSIS — Z1231 Encounter for screening mammogram for malignant neoplasm of breast: Secondary | ICD-10-CM

## 2022-01-23 ENCOUNTER — Ambulatory Visit (INDEPENDENT_AMBULATORY_CARE_PROVIDER_SITE_OTHER): Payer: Medicaid Other | Admitting: *Deleted

## 2022-01-23 DIAGNOSIS — J4550 Severe persistent asthma, uncomplicated: Secondary | ICD-10-CM | POA: Diagnosis not present

## 2022-01-26 ENCOUNTER — Telehealth: Payer: Self-pay | Admitting: Family Medicine

## 2022-01-26 NOTE — Telephone Encounter (Signed)
..   Medicaid Managed Care   Unsuccessful Outreach Note  01/26/2022 Name: Kelsey Bullock MRN: [id number] DOB: 11/19/1960  Referred by: [name], MD Reason for referral : High Risk Managed Medicaid (I called the patient today to get her scheduled with the MM Team. I left my name and number on her VM.)   An unsuccessful telephone outreach was attempted today. The patient was referred to the case management team for assistance with care management and care coordination.   Follow Up Plan: The care management team will reach out to the patient again over the next 14 days.     [place]

## 2022-01-31 ENCOUNTER — Telehealth: Payer: Self-pay | Admitting: Family Medicine

## 2022-01-31 NOTE — Telephone Encounter (Signed)
..   Medicaid Managed Care   Unsuccessful Outreach Note  01/31/2022 Name: Kelsey Bullock MRN: [id number] DOB: [birth year]  Referred by: [name], MD Reason for referral : High Risk Managed Medicaid (I called the patient today to get her scheduled with the MM Team. I left my name and number on her VM.)   A second unsuccessful telephone outreach was attempted today. The patient was referred to the case management team for assistance with care management and care coordination.   Follow Up Plan: The care management team will reach out to the patient again over the next 14 days.    [place]

## 2022-02-08 ENCOUNTER — Telehealth: Payer: Self-pay | Admitting: Family Medicine

## 2022-02-08 NOTE — Telephone Encounter (Signed)
..   Medicaid Managed Care   Unsuccessful Outreach Note  02/08/2022 Name: Kelsey Bullock MRN: [id number] DOB: [birth year]  Referred by: [name], MD Reason for referral : High Risk Managed Medicaid (I called the patient today to get her scheduled with the MM Team. I left my name and number on her VM.)   Third unsuccessful telephone outreach was attempted today. The patient was referred to the case management team for assistance with care management and care coordination. The patient's primary care provider has been notified of our unsuccessful attempts to make or maintain contact with the patient. The care management team is pleased to engage with this patient at any time in the future should he/she be interested in assistance from the care management team.   Follow Up Plan: We have been unable to make contact with the patient for follow up. The care management team is available to follow up with the patient after provider conversation with the patient regarding recommendation for care management engagement and subsequent re-referral to the care management team.    , [place]

## 2022-02-10 ENCOUNTER — Encounter (HOSPITAL_COMMUNITY): Payer: Self-pay

## 2022-02-10 ENCOUNTER — Ambulatory Visit (HOSPITAL_COMMUNITY)
Admission: EM | Admit: 2022-02-10 | Discharge: 2022-02-10 | Disposition: A | Discharge status: Home / Self Care | Payer: Medicaid Other | Attending: Internal Medicine | Admitting: Internal Medicine

## 2022-02-10 DIAGNOSIS — L739 Follicular disorder, unspecified: Secondary | ICD-10-CM | POA: Insufficient documentation

## 2022-02-10 DIAGNOSIS — N898 Other specified noninflammatory disorders of vagina: Secondary | ICD-10-CM | POA: Insufficient documentation

## 2022-02-10 LAB — POCT URINALYSIS DIPSTICK, ED / UC
Bilirubin Urine: NEGATIVE
Glucose, UA: NEGATIVE mg/dL
Hgb urine dipstick: NEGATIVE
Ketones, ur: NEGATIVE mg/dL
Leukocytes,Ua: NEGATIVE
Nitrite: NEGATIVE
Protein, ur: NEGATIVE mg/dL
Specific Gravity, Urine: 1.015 (ref 1.005–1.030)
Urobilinogen, UA: 0.2 mg/dL (ref 0.0–1.0)
pH: 7 (ref 5.0–8.0)

## 2022-02-10 MED ORDER — TERCONAZOLE 0.4 % VA CREA: 1.0000 | TOPICAL_CREAM | Freq: Every day | VAGINAL | 0 refills | Status: DC

## 2022-02-10 MED ORDER — MUPIROCIN 2 % EX OINT
1.0000 | TOPICAL_OINTMENT | Freq: Two times a day (BID) | CUTANEOUS | 0 refills | Status: DC
Start: 2022-02-10 — End: 2022-04-20

## 2022-02-10 NOTE — ED Provider Notes (Signed)
[place]    CSN: [id number] Arrival date & time: 02/10/22  1132      History   Chief Complaint Chief Complaint  Patient presents with   Vaginal Itching   Dysuria    HPI ZAKARIAH DEJARNETTE is a 61 y.o. female.   Patient presents today with several concerns.  She reports a 1 month history of painful lesion in her right axilla.  Symptoms began soon after she used Carlton Adam to remove the hair.  She reports an erythematous lesion that is painful to touch and she will often feel discomfort with her arm close to her body.  She has been applying Neosporin ointment but not done anything else to help manage symptoms.  She reports that this is not enlarging or changing any way.  Has not had any drainage.  Denies any recent antibiotic use or history of recurrent skin infections.  She denies history of MRSA, immunosuppression, diabetes.  In addition she reports a weeklong history of vaginal irritation.  She reports pruritus and irritation of the vagina.  She is interested in being tested for UTI as she has had some urinary urgency.  She denies any dysuria, frequency, pelvic pain, abdominal pain, fever, nausea, vomiting.  Denies any recent antibiotic or use or medication changes.  She has used terconazole left from a previous prescription but this is only provided minimal improvement of symptoms.    Past Medical History:  Diagnosis Date   Allergic rhinitis    Anemia    Anxiety    Arthritis    knees and back   Asthma    Complication of anesthesia    Congestive heart failure (CHF) ([place])    Depression    Gall stone    GERD (gastroesophageal reflux disease)    Headache    migraines, stress headaches   History of kidney stones    Hypertension    Hyperthyroidism    during pregnancy   Pneumonia    PONV (postoperative nausea and vomiting)    Recurrent upper respiratory infection (URI)    Sleep apnea    can't use Cpap    Patient Active Problem List   Diagnosis Date Noted    Trigger finger of right thumb 09/06/2021   Primary osteoarthritis of left knee 08/15/2021   Eustachian tube dysfunction, bilateral 07/18/2021   Vaginal pruritus 07/18/2021   Photosensitivity dermatitis 01/06/2021   Recurrent ventral incisional hernia 11/25/2020   Ventral hernia 10/21/2020   Polypharmacy 10/21/2020   Dehydration 04/19/2020   Pap smear for cervical cancer screening 02/25/2020   Constipation 03/22/2019   Hx of adenomatous colonic polyps    Pilonidal cyst 08/67/6195   Diastolic CHF with preserved left ventricular function, NYHA class 2 ([place]) 12/14/2016   GERD (gastroesophageal reflux disease) 02/06/2015   Severe persistent asthma 02/06/2015   Intertrigo 10/22/2014   Depression, major, recurrent ([place]) 02/19/2014   Sleep apnea 03/10/2009   Seborrheic eczema 07/17/2007   LOW BACK PAIN SYNDROME 11/26/2006   HYPERCHOLESTEROLEMIA 08/02/2006   Morbid obesity ([place]) 08/02/2006   Anxiety state 08/02/2006   PSEUDOTUMOR CEREBRI 08/02/2006   OSTEOARTHRITIS, LOWER LEG 08/02/2006   FIBROMYALGIA, FIBROMYOSITIS 08/02/2006    Past Surgical History:  Procedure Laterality Date   ADENOIDECTOMY     APPLICATION OF WOUND VAC  09/32/6712   APPLICATION OF WOUND VAC N/A 03/18/2019   Procedure: Application Of Wound Vac;  Surgeon: Donnie Mesa, MD;  Location: [facility];  Service: General;  Laterality: N/A;   BIOPSY  05/27/2018  Procedure: BIOPSY;  Surgeon: Mauri Pole, MD;  Location: [facility];  Service: Endoscopy;;   CESAREAN SECTION     COLONOSCOPY WITH PROPOFOL N/A 05/27/2018   Procedure: COLONOSCOPY WITH PROPOFOL;  Surgeon: Mauri Pole, MD;  Location: [facility];  Service: Endoscopy;  Laterality: N/A;   CYST EXCISION     EXPLORATORY LAPAROTOMY  02/03/2019   INGUINAL HERNIA REPAIR N/A 02/03/2019   Procedure: EXPLORATORY LAPAROTOMY WITH SMALL BOWEL RESECTION AND PRIMARY CLOSURE OF VENTRAL HERNIA;  Surgeon: Donnie Mesa, MD;  Location: [facility];  Service: General;   Laterality: N/A;   INSERTION OF MESH N/A 11/25/2020   Procedure: INSERTION OF MESH;  Surgeon: Donnie Mesa, MD;  Location: [facility];  Service: General;  Laterality: N/A;   OVARIAN CYST REMOVAL     POLYPECTOMY  05/27/2018   Procedure: POLYPECTOMY;  Surgeon: Mauri Pole, MD;  Location: [facility];  Service: Endoscopy;;   removal of gallstones     TONSILLECTOMY     TYMPANOSTOMY TUBE PLACEMENT     VENTRAL HERNIA REPAIR N/A 11/25/2020   Procedure: RECURRENT VENTRAL HERNIA REPAIR WITH MESH;  Surgeon: Donnie Mesa, MD;  Location: [facility];  Service: General;  Laterality: N/A;   WOUND EXPLORATION  03/18/2019   abdominal   WOUND EXPLORATION N/A 03/18/2019   Procedure: ABDOMINAL WOUND EXPLORATION;  Surgeon: Donnie Mesa, MD;  Location: [facility];  Service: General;  Laterality: N/A;    OB History   No obstetric history on file.      Home Medications    Prior to Admission medications   Medication Sig Start Date End Date Taking? Authorizing Provider  mupirocin ointment (BACTROBAN) 2 % Apply 1 Application topically 2 (two) times daily. 02/10/22  Yes Donye Campanelli K, PA-C  terconazole (TERAZOL 7) 0.4 % vaginal cream Place 1 applicator vaginally at bedtime. 02/10/22  Yes Srihitha Tagliaferri K, PA-C  albuterol (PROVENTIL) (2.5 MG/3ML) 0.083% nebulizer solution Take 3 mLs (2.5 mg total) by nebulization every 4 (four) hours as needed. USE 1 VIAL VIA NEBULIZER EVERY 4 TO 6 HOURS AS NEEDED FOR COUGH OR WHEEZING 09/20/21   Kozlow, Donnamarie Poag, MD  albuterol (VENTOLIN HFA) 108 (90 Base) MCG/ACT inhaler Inhale 2 puffs into the lungs every 4 (four) hours as needed. 09/20/21   Kozlow, Donnamarie Poag, MD  Ascorbic Acid (VITAMIN C) 1000 MG tablet Take 1,000 mg by mouth daily.    [provider]  cetirizine (ZYRTEC) 10 MG tablet Take 1 tablet (10 mg total) by mouth 2 (two) times daily as needed for allergies (Can take an extra dose for breakthrough symptoms). 09/20/21   Kozlow, Donnamarie Poag, MD  clonazePAM (KLONOPIN) 0.5 MG tablet  TAKE 1 TABLET(0.5 MG) BY MOUTH AT BEDTIME 11/16/20   [name], MD  Cobalamin Combinations (B12 FOLATE PO) Take 1 tablet by mouth daily.    [provider]  COLLAGEN PO Take 1 Scoop by mouth daily.    [provider]  Cranberry 1000 MG CAPS Take 1,000 mg by mouth daily.    [provider]  cyclobenzaprine (FLEXERIL) 10 MG tablet Take 1 tablet (10 mg total) by mouth 3 (three) times daily as needed for muscle spasms. 06/30/21   [name], MD  diclofenac Sodium (VOLTAREN) 1 % GEL Apply 4 g topically 4 (four) times daily. 08/15/21   [name], MD  EPIPEN 2-PAK 0.3 MG/0.3ML SOAJ injection Inject 0.3 mg into the muscle as needed for anaphylaxis. 09/20/21   Kozlow, Donnamarie Poag, MD  famotidine (PEPCID) 40 MG tablet Take 1 tablet (40 mg total) by mouth daily. 09/20/21   Kozlow, Donnamarie Poag, MD  fluticasone (FLONASE) 50 MCG/ACT nasal spray SHAKE LIQUID AND USE 2 SPRAYS IN EACH NOSTRIL DAILY 09/20/21   Kozlow, Donnamarie Poag, MD  ibuprofen (ADVIL) 600 MG tablet Take 1 tablet (600 mg total) by mouth every 6 (six) hours as needed. 04/29/21   Melynda Ripple, MD  ipratropium (ATROVENT) 0.06 % nasal spray USE 2 SPRAYS INTO EACH NOSTRIL EVERY 6 HOURS AS NEEDED FOR TO DRY UP NOSE. 11/21/21   Kozlow, Donnamarie Poag, MD  linaclotide [facility]) 72 MCG capsule Take 144 mcg by mouth daily before breakfast. 10/15/20   [provider]  mometasone-formoterol (DULERA) 200-5 MCG/ACT AERO Inhale 2 puffs into the lungs 2 (two) times daily. 09/20/21   Kozlow, Donnamarie Poag, MD  montelukast (SINGULAIR) 10 MG tablet Take one tablet by mouth at bedtime 06/30/21   [name], MD  norethindrone (AYGESTIN) 5 MG tablet Take 2.5 mg by mouth at bedtime. 02/11/19   [provider]  nystatin powder APPLY TOPICALLY TO THE AFFECTED AREA THREE TIMES DAILY 01/05/21   [name], MD  polyethylene glycol powder (GLYCOLAX/MIRALAX) 17 GM/SCOOP powder Take 17 g by mouth 2 (two) times daily as needed. 09/01/19   [name], MD  Probiotic Product (PROBIOTIC DAILY PO) Take 1 capsule by mouth daily.    [provider]  Pyridoxine HCl (B-6) 50 MG TABS Take 50 mg by mouth daily.    [provider]  triamcinolone (KENALOG) 0.025 % ointment Apply topically 2 (two) times daily. 07/18/21   [name], MD  Vitamin E 100 units TABS Take 100 Units by mouth daily.    [provider]  [name] 150 MG injection INJECT 300 MG UNDER THE SKIN EVERY 29 DAYS 04/07/21   Kozlow, Donnamarie Poag, MD    Family History Family History  Adopted: Yes  Problem Relation Age of Onset   Diabetes Mother    Diabetes Paternal Uncle    Allergic rhinitis Neg Hx    Angioedema Neg Hx    Asthma Neg Hx    Atopy Neg Hx    Eczema Neg Hx    Immunodeficiency Neg Hx    Urticaria Neg Hx     Social History Social History   Tobacco Use   Smoking status: Never   Smokeless tobacco: Never  Vaping Use   Vaping Use: Never used  Substance Use Topics   Alcohol use: No   Drug use: No     Allergies   Amoxicillin, Etodolac, Penicillins, Latex, Pantoprazole sodium, Wound dressing adhesive, Ampicillin, Augmentin [amoxicillin-pot clavulanate], Diclofenac sodium, Pantoprazole sodium, and Adhesive [tape]   Review of Systems Review of Systems  Constitutional:  Positive for activity change. Negative for appetite change, fatigue and fever.  Respiratory:  Negative for cough and shortness of breath.   Cardiovascular:  Negative for chest pain.  Gastrointestinal:  Negative for abdominal pain, diarrhea, nausea and vomiting.  Genitourinary:  Positive for vaginal pain. Negative for dysuria, frequency, pelvic pain, urgency, vaginal bleeding and vaginal discharge.  Musculoskeletal:  Negative for arthralgias and myalgias.  Skin:  Positive for color change and wound.     Physical Exam Triage Vital Signs ED Triage Vitals  Enc Vitals Group     BP 02/10/22 1302 (!) 146/90     Pulse Rate 02/10/22 1302 62     Resp 02/10/22 1302  12     Temp  02/10/22 1302 98.4 F (36.9 C)     Temp Source 02/10/22 1302 Oral     SpO2 02/10/22 1302 97 %     Weight 02/10/22 1221 270 lb (122.5 kg)     Height --      Head Circumference --      Peak Flow --      Pain Score 02/10/22 1220 7     Pain Loc --      Pain Edu? --      Excl. in [place]? --    No data found.  Updated Vital Signs BP (!) 146/90 (BP Location: Left Wrist)   Pulse 62   Temp 98.4 F (36.9 C) (Oral)   Resp 12   Wt 270 lb (122.5 kg)   LMP 02/03/2019 Comment: Tubal Ligation  SpO2 97%   BMI 46.35 kg/m   Visual Acuity Right Eye Distance:   Left Eye Distance:   Bilateral Distance:    Right Eye Near:   Left Eye Near:    Bilateral Near:     Physical Exam Vitals reviewed.  Constitutional:      General: She is awake. She is not in acute distress.    Appearance: Normal appearance. She is well-developed. She is not ill-appearing.     Comments: Very pleasant female appears stated age in no acute distress sitting comfortably in exam room  HENT:     Head: Normocephalic and atraumatic.  Cardiovascular:     Rate and Rhythm: Normal rate and regular rhythm.     Heart sounds: Normal heart sounds, S1 normal and S2 normal. No murmur heard. Pulmonary:     Effort: Pulmonary effort is normal.     Breath sounds: Normal breath sounds. No wheezing, rhonchi or rales.     Comments: Clear to auscultation bilaterally Abdominal:     General: Bowel sounds are normal.     Palpations: Abdomen is soft.     Tenderness: There is no abdominal tenderness. There is no right CVA tenderness, left CVA tenderness, guarding or rebound.     Comments: Benign abdominal exam  Skin:    Comments: 1 cm erythematous nodule noted right axilla.  Significant induration without fluctuance.  Palpation of lesion allows for expression of purulent drainage.  Psychiatric:        Behavior: Behavior is cooperative.      UC Treatments / Results  Labs (all labs ordered are listed, but only abnormal  results are displayed) Labs Reviewed  POCT URINALYSIS DIPSTICK, ED / UC  CERVICOVAGINAL ANCILLARY ONLY    EKG   Radiology No results found.  Procedures Procedures (including critical care time)  Medications Ordered in UC Medications - No data to display  Initial Impression / Assessment and Plan / UC Course  I have reviewed the triage vital signs and the nursing notes.  Pertinent labs & imaging results that were available during my care of the patient were reviewed by me and considered in my medical decision making (see chart for details).     Suspect folliculitis as etiology of symptoms related to hair removal.  Lesion is actively draining and she was encouraged to use warm compresses to encourage ongoing drainage.  She is to use Bactroban ointment for additional symptom relief.  Discussed that if her symptoms or not improving or if they worsen in any way and she has enlarging lesion, change drainage, fever, nausea, vomiting she needs to be seen immediately to which she expressed understanding.  Recommended she avoid any hair  removal procedures until symptoms resolve.  UA was normal.  Suspect symptoms are related to vaginal yeast infection.  Refill of terconazole was sent to the pharmacy per her request.  STI swab was collected today and we will contact her if we need to arrange additional treatment.  She was encouraged to use hypoallergenic soaps and detergents and wear loosefitting cotton underwear.  If she develops any additional symptoms she is to return for reevaluation.  Final Clinical Impressions(s) / UC Diagnoses   Final diagnoses:  Vaginal irritation  Folliculitis     Discharge Instructions      I believe you have an infected hair follicle causing your symptoms.  Please use warm compresses to encourage drainage.  We were able to drain a lot of the fluid from it today.  Apply ointment twice daily with dressing changes.  If anything worsens or changes and becomes  larger, more painful, change in drainage you need to be seen immediately.  Your urine was normal.  I am concerned you have a vaginal yeast infection.  Use terconazole as prescribed.  We will contact you for any change in additional treatment.  Wear loosefitting cotton underwear and use hypoallergenic soaps and detergents.  If anything worsens you need to return for reevaluation.     ED Prescriptions     Medication Sig Dispense Auth. Provider   mupirocin ointment (BACTROBAN) 2 % Apply 1 Application topically 2 (two) times daily. 22 g Dravon Nott K, PA-C   terconazole (TERAZOL 7) 0.4 % vaginal cream Place 1 applicator vaginally at bedtime. 45 g Jelena Malicoat K, PA-C      PDMP not reviewed this encounter.   Terrilee Croak, PA-C 02/10/22 1331

## 2022-02-10 NOTE — Discharge Instructions (Addendum)
I believe you have an infected hair follicle causing your symptoms.  Please use warm compresses to encourage drainage.  We were able to drain a lot of the fluid from it today.  Apply ointment twice daily with dressing changes.  If anything worsens or changes and becomes larger, more painful, change in drainage you need to be seen immediately.  Your urine was normal.  I am concerned you have a vaginal yeast infection.  Use terconazole as prescribed.  We will contact you for any change in additional treatment.  Wear loosefitting cotton underwear and use hypoallergenic soaps and detergents.  If anything worsens you need to return for reevaluation.

## 2022-02-10 NOTE — ED Triage Notes (Signed)
Pt is here for vaginal itching and urgency 3-7 days , sore on right arm causing pain and discomfort. X1 month

## 2022-02-13 LAB — CERVICOVAGINAL ANCILLARY ONLY
Candida Glabrata: NEGATIVE
Candida Vaginitis: NEGATIVE
Comment: NEGATIVE
Comment: NEGATIVE
Comment: NEGATIVE
Comment: NEGATIVE
Comment: NEGATIVE
Comment: NORMAL
Trichomonas: NEGATIVE

## 2022-02-14 ENCOUNTER — Telehealth (HOSPITAL_COMMUNITY): Payer: Self-pay | Admitting: Emergency Medicine

## 2022-02-14 NOTE — Telephone Encounter (Signed)
Patient will need to return for recollect for vaginal cytology swab from recent visit. She was notified and states she will try to return today

## 2022-02-15 ENCOUNTER — Ambulatory Visit (HOSPITAL_COMMUNITY)
Admission: EM | Admit: 2022-02-15 | Discharge: 2022-02-15 | Disposition: A | Discharge status: Home / Self Care | Payer: Medicaid Other | Attending: Internal Medicine | Admitting: Internal Medicine

## 2022-02-15 DIAGNOSIS — Z113 Encounter for screening for infections with a predominantly sexual mode of transmission: Secondary | ICD-10-CM | POA: Insufficient documentation

## 2022-02-15 DIAGNOSIS — H47323 Drusen of optic disc, bilateral: Secondary | ICD-10-CM | POA: Diagnosis not present

## 2022-02-15 DIAGNOSIS — H04123 Dry eye syndrome of bilateral lacrimal glands: Secondary | ICD-10-CM | POA: Diagnosis not present

## 2022-02-15 DIAGNOSIS — H0102B Squamous blepharitis left eye, upper and lower eyelids: Secondary | ICD-10-CM | POA: Diagnosis not present

## 2022-02-15 DIAGNOSIS — N898 Other specified noninflammatory disorders of vagina: Secondary | ICD-10-CM | POA: Insufficient documentation

## 2022-02-15 DIAGNOSIS — H1045 Other chronic allergic conjunctivitis: Secondary | ICD-10-CM | POA: Diagnosis not present

## 2022-02-15 DIAGNOSIS — H0102A Squamous blepharitis right eye, upper and lower eyelids: Secondary | ICD-10-CM | POA: Diagnosis not present

## 2022-02-15 DIAGNOSIS — L719 Rosacea, unspecified: Secondary | ICD-10-CM | POA: Diagnosis not present

## 2022-02-15 DIAGNOSIS — H40023 Open angle with borderline findings, high risk, bilateral: Secondary | ICD-10-CM | POA: Diagnosis not present

## 2022-02-15 DIAGNOSIS — H40033 Anatomical narrow angle, bilateral: Secondary | ICD-10-CM | POA: Diagnosis not present

## 2022-02-15 NOTE — ED Triage Notes (Signed)
Patient presents for recollect of rejected cyto swab from recent visit

## 2022-02-16 LAB — CERVICOVAGINAL ANCILLARY ONLY
Bacterial Vaginitis (gardnerella): NEGATIVE
Candida Glabrata: NEGATIVE
Candida Vaginitis: NEGATIVE
Chlamydia: NEGATIVE
Comment: NEGATIVE
Comment: NEGATIVE
Comment: NEGATIVE
Comment: NEGATIVE
Comment: NEGATIVE
Comment: NORMAL
Neisseria Gonorrhea: NEGATIVE
Trichomonas: NEGATIVE

## 2022-02-17 ENCOUNTER — Emergency Department (HOSPITAL_COMMUNITY): Payer: Medicaid Other

## 2022-02-17 ENCOUNTER — Emergency Department (HOSPITAL_COMMUNITY): Admission: EM | Admit: 2022-02-17 | Payer: Medicaid Other | Source: Home / Self Care

## 2022-02-17 ENCOUNTER — Emergency Department (HOSPITAL_COMMUNITY)
Admission: EM | Admit: 2022-02-17 | Discharge: 2022-02-18 | Disposition: A | Discharge status: Home / Self Care | Payer: Medicaid Other | Attending: Emergency Medicine | Admitting: Emergency Medicine

## 2022-02-17 ENCOUNTER — Ambulatory Visit (HOSPITAL_COMMUNITY): Payer: Medicaid Other

## 2022-02-17 ENCOUNTER — Encounter (HOSPITAL_COMMUNITY): Payer: Self-pay | Admitting: Emergency Medicine

## 2022-02-17 DIAGNOSIS — W19XXXA Unspecified fall, initial encounter: Secondary | ICD-10-CM | POA: Diagnosis not present

## 2022-02-17 DIAGNOSIS — W208XXA Other cause of strike by thrown, projected or falling object, initial encounter: Secondary | ICD-10-CM | POA: Diagnosis not present

## 2022-02-17 DIAGNOSIS — S01411A Laceration without foreign body of right cheek and temporomandibular area, initial encounter: Secondary | ICD-10-CM | POA: Insufficient documentation

## 2022-02-17 DIAGNOSIS — R600 Localized edema: Secondary | ICD-10-CM | POA: Insufficient documentation

## 2022-02-17 DIAGNOSIS — S0993XA Unspecified injury of face, initial encounter: Secondary | ICD-10-CM | POA: Diagnosis not present

## 2022-02-17 DIAGNOSIS — S060X0A Concussion without loss of consciousness, initial encounter: Secondary | ICD-10-CM | POA: Diagnosis not present

## 2022-02-17 DIAGNOSIS — S0083XA Contusion of other part of head, initial encounter: Secondary | ICD-10-CM | POA: Diagnosis not present

## 2022-02-17 DIAGNOSIS — Y92513 Shop (commercial) as the place of occurrence of the external cause: Secondary | ICD-10-CM | POA: Diagnosis not present

## 2022-02-17 DIAGNOSIS — R519 Headache, unspecified: Secondary | ICD-10-CM | POA: Diagnosis not present

## 2022-02-17 DIAGNOSIS — S0990XA Unspecified injury of head, initial encounter: Secondary | ICD-10-CM | POA: Diagnosis not present

## 2022-02-17 DIAGNOSIS — M47812 Spondylosis without myelopathy or radiculopathy, cervical region: Secondary | ICD-10-CM | POA: Diagnosis not present

## 2022-02-17 DIAGNOSIS — M7981 Nontraumatic hematoma of soft tissue: Secondary | ICD-10-CM | POA: Diagnosis not present

## 2022-02-17 DIAGNOSIS — S199XXA Unspecified injury of neck, initial encounter: Secondary | ICD-10-CM | POA: Diagnosis not present

## 2022-02-17 NOTE — ED Provider Triage Note (Signed)
Emergency Medicine Provider Triage Evaluation Note  Kelsey Bullock , a 61 y.o. female  was evaluated in triage.  Pt complains of facial and head pain after a jewelry stand fell onto her head.  She reports it was a large and heavy jewelry stand.  Denies any LOC.  Denies any blood thinner use..  Review of Systems  Positive:  Negative:   Physical Exam  LMP 02/03/2019 Comment: Tubal Ligation Gen:   Awake, no distress   Resp:  Normal effort  MSK:   Moves extremities without difficulty  Other:  Small laceration noted infraorbitally on the right cheek.  Bruising noted.  Answering questions appropriately with appropriate speech.  Grossly neuro intact.  Medical Decision Making  Medically screening exam initiated at 7:52 PM.  Appropriate orders placed.  Kelsey Bullock was informed that the remainder of the evaluation will be completed by another provider, this initial triage assessment does not replace that evaluation, and the importance of remaining in the ED until their evaluation is complete.  We will order CT head, neck, and max/face   Sherrell Puller, PA-C 02/17/22 1954

## 2022-02-17 NOTE — ED Triage Notes (Signed)
Patient BIB [facility] from a beauty supply store after a plastic piece of a display fell and hit her in the head. No LOC, patient is alert, oriented, and in no apparent distress at this time.  BP 122/86 HR 80 95% on room air

## 2022-02-17 NOTE — ED Provider Triage Note (Signed)
Emergency Medicine Provider Triage Evaluation Note  Kelsey Bullock , a 61 y.o. female  was evaluated in triage.  Pt complains of right facial pain.  Patient reports a display case fell on her earlier today.  Denies any LOC.  Reports a headache.  Denies any blurry vision.  She has a small laceration noted to her upper right cheek.  Review of Systems  Positive:  Negative:   Physical Exam  There were no vitals taken for this visit. Gen:   Awake, no distress   Resp:  Normal effort  MSK:   Moves extremities without difficulty  Other:  Small laceration noted infraorbitally on the right cheekbone.  Bruising noted.  Medical Decision Making  Medically screening exam initiated at 7:16 PM.  Appropriate orders placed.  Kelsey Bullock was informed that the remainder of the evaluation will be completed by another provider, this initial triage assessment does not replace that evaluation, and the importance of remaining in the ED until their evaluation is complete.  We will order CT and CT max face.   Kelsey Bullock, [place] 02/17/22 1919

## 2022-02-18 NOTE — ED Provider Notes (Signed)
[facility] EMERGENCY DEPARTMENT Provider Note   CSN: [id number] Arrival date & time: 02/17/22  1842     History  Chief Complaint  Patient presents with   Head Injury    Kelsey Bullock is a 61 y.o. female who presents to the ED for evaluation of head trauma sustained while she was at abuses play store.  A jewelry case was excellently knocked off of a high shelf and fell on top of her head on the right temple and cheek bone.  No LOC but she has been having some lightheadedness, nausea, and headache since that time.  No blurry double vision, no vomiting, no confusion.  I personally reviewed her medical records.  She like her medical diagnoses or have medications She has history of pseudotumor cerebri, fibromyalgia, ventral hernia repair, morbid obesity, and anxiety.  HPI     Home Medications Prior to Admission medications   Not on File      Allergies    Patient has no known allergies.    Review of Systems   Review of Systems  Constitutional: Negative.   HENT: Negative.    Eyes:  Positive for photophobia. Negative for visual disturbance.  Respiratory: Negative.    Cardiovascular: Negative.   Gastrointestinal:  Positive for nausea. Negative for diarrhea and vomiting.  Neurological:  Positive for headaches.    Physical Exam Updated Vital Signs BP 134/65 (BP Location: Right Arm)   Pulse 60   Temp 98 F (36.7 C) (Oral)   Resp 17   SpO2 99%  Physical Exam Vitals and nursing note reviewed.  Constitutional:      Appearance: She is obese. She is not ill-appearing or toxic-appearing.  HENT:     Head: Normocephalic and atraumatic.      Mouth/Throat:     Mouth: Mucous membranes are moist.     Pharynx: No oropharyngeal exudate or posterior oropharyngeal erythema.  Eyes:     General:        Right eye: No discharge.        Left eye: No discharge.     Extraocular Movements: Extraocular movements intact.     Conjunctiva/sclera: Conjunctivae normal.      Pupils: Pupils are equal, round, and reactive to light.     Comments: EOMI without pain in the eyes  Cardiovascular:     Rate and Rhythm: Normal rate and regular rhythm.     Pulses: Normal pulses.     Heart sounds: Normal heart sounds. No murmur heard. Pulmonary:     Effort: Pulmonary effort is normal. No respiratory distress.     Breath sounds: Normal breath sounds. No wheezing or rales.  Abdominal:     Palpations: Abdomen is soft.     Tenderness: There is no abdominal tenderness.  Musculoskeletal:        General: No deformity.     Cervical back: Neck supple.     Right lower leg: Edema present.     Left lower leg: Edema present.  Skin:    General: Skin is warm and dry.     Capillary Refill: Capillary refill takes less than 2 seconds.  Neurological:     General: No focal deficit present.     Mental Status: She is alert and oriented to person, place, and time. Mental status is at baseline.     GCS: GCS eye subscore is 4. GCS verbal subscore is 5. GCS motor subscore is 6.     Motor: Motor function is intact.  Coordination: Coordination is intact.  Psychiatric:        Mood and Affect: Mood normal.     ED Results / Procedures / Treatments   Labs (all labs ordered are listed, but only abnormal results are displayed) Labs Reviewed - No data to display  EKG None  Radiology No results found.  Procedures Procedures    Medications Ordered in ED Medications - No data to display  ED Course/ Medical Decision Making/ A&P                           Medical Decision Making 61 year old female presents after head trauma at store, headache.  Vital signs are normal and intake.  Cardiopulmonary exam is normal, abdominal exam is with ventral hernias but otherwise benign, no evidence of incarceration at this time.  Bruising and tiny laceration over the right diabetic Kelsey Bullock beneath the right eye, PERRLA EOMI, GCS of 15.  Amount and/or Complexity of Data Reviewed Radiology:      Details: CT scans visualized by this provider, CT head negative for acute cranial normality, CT max face without acute facial fractures or anomalies, and CT C-spine without traumatic injury CT reads accidentally entered into patient's secondary chart with which this chart is to be merged, MRN [id number].     Clinical picture most consistent with acute concussion secondary to head trauma.  No evidence of dangerous traumatic injury at this time.  Will provide concussion precautions and recommended close outpatient follow-up with her PCP.  No further work-up warranted near this time.  Kelsey Bullock  voiced understanding of her medical evaluation and treatment plan. Each of their questions answered to their expressed satisfaction.  Return precautions were given.  Patient is well-appearing, stable, and was discharged in good condition.  This chart was dictated using voice recognition software, Dragon. Despite the best efforts of this provider to proofread and correct errors, errors may still occur which can change documentation meaning.   Final Clinical Impression(s) / ED Diagnoses Final diagnoses:  Concussion without loss of consciousness, initial encounter    Rx / DC Orders ED Discharge Orders     None         Kelsey Bullock 02/18/22 0636    Kelsey Blank, MD 02/18/22 [phone]

## 2022-02-18 NOTE — Discharge Instructions (Signed)
You are seen in the ER today for after your head injury.  Your CT scans were reassuring as was your physical exam.  You may apply ice on your right eye as needed.  You may apply antibiotic ointment to your small wound in your right cheek as well.  Regarding your nausea and lightheadedness you likely are experiencing a mild concussion.  May take Tylenol 650 mg every 4 hours as needed for headache. It is very important that you rest over the next 48 hours to 1 week. You should also not participate in any exercise or sports for at least 7 days and until you have no symptoms including headache, dizziness, nausea, or vomiting. Once your symptoms have resolved and you have gone at least 7 days following your injury, you may gradually return to light exercise as directed by your regular primary care provider. Return to the emergency department for repeat evaluation if you develop more than 2 episodes of vomiting, worsening headache despite use of Tylenol, difficulties with speech or balance, or other new concerns.

## 2022-02-20 ENCOUNTER — Encounter (HOSPITAL_COMMUNITY): Payer: Self-pay

## 2022-02-20 ENCOUNTER — Ambulatory Visit: Payer: Medicaid Other

## 2022-02-22 ENCOUNTER — Ambulatory Visit
Admission: RE | Admit: 2022-02-22 | Discharge: 2022-02-22 | Disposition: A | Discharge status: Home / Self Care | Payer: Medicaid Other | Source: Ambulatory Visit | Attending: Family Medicine | Admitting: Family Medicine

## 2022-02-22 DIAGNOSIS — Z1231 Encounter for screening mammogram for malignant neoplasm of breast: Secondary | ICD-10-CM

## 2022-02-23 ENCOUNTER — Ambulatory Visit: Payer: Medicaid Other

## 2022-02-23 DIAGNOSIS — J4540 Moderate persistent asthma, uncomplicated: Secondary | ICD-10-CM | POA: Diagnosis not present

## 2022-02-27 ENCOUNTER — Other Ambulatory Visit: Payer: Self-pay | Admitting: Obstetrics and Gynecology

## 2022-02-27 NOTE — Patient Instructions (Signed)
Visit Information  Kelsey Bullock was given information about Medicaid Managed Care team care coordination services as a part of their [place] Medicaid benefit. Kelsey Bullock verbally consented to engagement with the [facility] Managed Care team.   If you are experiencing a medical emergency, please call 911 or report to your local emergency department or urgent care.   If you have a non-emergency medical problem during routine business hours, please contact your provider's office and ask to speak with a nurse.   For questions related to your [facility], please call: [phone] or visit the homepage here: [email or web address]  If you would like to schedule transportation through your [facility], please call the following number at least 2 days in advance of your appointment: [phone]   Rides for urgent appointments can also be made after hours by calling Member Services.  Call the [place] at [phone], at any time, 24 hours a day, 7 days a week. If you are in danger or need immediate medical attention call 911.  If you would like help to quit smoking, call 1-800-QUIT-NOW 724-448-9109) OR Espaol: 1-855-Djelo-Ya (1-448-185-6314) o para ms informacin haga clic aqu or Text READY to 200-400 to register via text  Kelsey Bullock - following are the goals we discussed in your visit today:   Goals Addressed    Timeframe:  Long-Range Goal Priority:  High Start Date:   02/27/22                          Expected End Date:ongoing                       Follow Up Date 04/03/22    - practice safe sex - schedule appointment for flu shot - schedule appointment for vaccines needed due to my age or health - schedule recommended health tests (blood work, mammogram, colonoscopy, pap test) - schedule and keep appointment for annual check-up     Why is this important?   Screening tests can find diseases early when they are easier to treat.  Your doctor or nurse will talk with you about which tests are important for you.  Getting shots for common diseases like the flu and shingles will help prevent them.    Patient verbalizes understanding of instructions and care plan provided today and agrees to view in [place]. Active MyChart status and patient understanding of how to access instructions and care plan via MyChart confirmed with patient.     The Managed Medicaid care management team will reach out to the patient again over the next 30 business  days.  The  Patient has been provided with contact information for the Managed Medicaid care management team and has been advised to call with any health related questions or concerns.   Aida Raider RN, BSN [place]  [facility] [employer] - Managed Medicaid High Risk [phone].   Following is a copy of your plan of care:  Care Plan : [place] of Care  Updates made by Gayla Medicus, RN since 02/27/2022 12:00 AM     Problem: Health Promotion or Disease Self-Management (General Plan of Care)      Long-Range Goal: Chronic Disease Management and Care Coordination Needs   Start Date: 02/27/2022  Expected End Date: 05/29/2022  Priority: High  Note:   Current Barriers:  Knowledge Deficits related  to plan of care for management of CHF, GERD, asthma, osteoarthritis, constipation, sleep apnea Care Coordination needs related to Transportation and Housing barriers Chronic Disease Management support and education needs related to CHF, GERD, Asthma,osteoarthritis, constipation, sleep apnea.  RNCM Clinical Goal(s):  Patient will verbalize understanding of plan for management of CHF, GERD, asthma, osteoarthritis, constipation, sleep apnea as evidenced by patient report verbalize basic understanding of  CHF, GERD, asthma, osteoarthritis,  constipation, sleep apnea disease process and self health management plan as evidenced by patient report take all medications exactly as prescribed and will call provider for medication related questions as evidenced by patient report demonstrate understanding of rationale for each prescribed medication as evidenced by patient report attend all scheduled medical appointments as evidenced by patient report continue to work with RN Care Manager to address care management and care coordination needs related to  CHF, GERD, asthma, osteoarthritis, constipation, sleep apnea as evidenced by adherence to CM Team Scheduled appointments through collaboration with RN Care manager, provider, and care team.   Interventions: Inter-disciplinary care team collaboration (see longitudinal plan of care) Evaluation of current treatment plan related to  self management and patient's adherence to plan as established by provider  Asthma: (Status:New goal.) Long Term Goal Provided patient with basic written and verbal Asthma education on self care/management/and exacerbation prevention Advised patient to track and manage Asthma triggers Provided education about and advised patient to utilize infection prevention strategies to reduce risk of respiratory infection Discussed the importance of adequate rest and management of fatigue with Asthma Assessed social determinant of health barriers   CAD Interventions: (Status:  New goal.) Long Term Goal Assessed understanding of CAD diagnosis Medications reviewed including medications utilized in CAD treatment plan Provided education on importance of blood pressure control in management of CAD Provided education on Importance of limiting foods high in cholesterol Counseled on importance of regular laboratory monitoring as prescribed Reviewed Importance of taking all medications as prescribed Reviewed Importance of attending all scheduled provider appointments Advised to  report any changes in symptoms or exercise tolerance Assessed social determinant of health barriers  Heart Failure Interventions:  (Status:  New goal.) Long Term Goal Basic overview and discussion of pathophysiology of Heart Failure reviewed Discussed the importance of keeping all appointments with provider Assessed social determinant of health barriers     (Status:  New goal.)  Long Term Goal Evaluation of current treatment plan related to  CHF, GERD, asthma, osteoarthritis, constipation, sleep apnea , Transportation and Housing barriers self-management and patient's adherence to plan as established by provider. Discussed plans with patient for ongoing care management follow up and provided patient with direct contact information for care management team Evaluation of current treatment plan related to chronic health conditions  and patient's adherence to plan as established by provider Advised patient to provide appropriate vaccination information to provider or CM team member at next visit Advised patient to contact Legal Aid as a resource Reviewed medications with patient  Provided patient with information about Legal Aid and transportation Reviewed scheduled/upcoming provider appointments Discussed plans with patient for ongoing care management follow up and provided patient with direct contact information for care management team  SDOH Barriers (Status:  New goal.) Long Term Goal Patient interviewed and SDOH assessment performed        Patient interviewed and appropriate assessments performed Provided patient with information about Legal Aid and [facility] Transportation services. Discussed plans with patient for ongoing care management follow up and provided patient with direct contact information  for care management team  Patient Goals/Self-Care Activities: Take all medications as prescribed Attend all scheduled provider appointments Call pharmacy for medication refills 3-7 days in  advance of running out of medications Perform all self care activities independently  Perform IADL's (shopping, preparing meals, housekeeping, managing finances) independently Call provider office for new concerns or questions   Follow Up Plan:  The patient has been provided with contact information for the care management team and has been advised to call with any health related questions or concerns.  The care management team will reach out to the patient again over the next 30 business  days.

## 2022-02-27 NOTE — Patient Outreach (Signed)
Medicaid Managed Care   Nurse Care Manager Note  02/27/2022 Name:  Kelsey Bullock MRN:  [id number] DOB:  1960/10/09  Kelsey Bullock is an 61 y.o. year old female who is a primary patient of Hensel, [name], MD.  The [facility] Managed Care Coordination team was consulted for assistance with:  chronic healthcare management needs,CHF, GERD, asthma, osteoarthritis, constipation, sleep apnea  Patient agreed to services and verbal consent obtained.  [name] was given information about Medicaid Managed Care Coordination team services today. Kelsey Bullock Patient agreed to services and verbal consent obtained.  Engaged with patient by telephone for initial visit in response to provider referral for case management and/or care coordination services.   Assessments/Interventions:  Review of past medical history, allergies, medications, health status, including review of consultants reports, laboratory and other test data, was performed as part of comprehensive evaluation and provision of chronic care management services.  SDOH (Social Determinants of Health) assessments and interventions performed: Care Plan  Allergies  Allergen Reactions   Amoxicillin Anaphylaxis and Swelling   Etodolac Anaphylaxis and Shortness Of Breath     lips swelled  lips swelled  lips swelled   Penicillins Anaphylaxis, Swelling and Rash    DID THE REACTION INVOLVE: Swelling of the face/tongue/throat, SOB, or low BP? Yes Sudden or severe rash/hives, skin peeling, or the inside of the mouth or nose? No Did it require medical treatment? No When did it last happen?      20 years ago If all above answers are "NO", may proceed with cephalosporin use.    Latex Rash and Hives   Pantoprazole Sodium Hives    unspecified REACTION: unspecified   Wound Dressing Adhesive Rash   Ampicillin Swelling   Augmentin [Amoxicillin-Pot Clavulanate] Swelling   Diclofenac Sodium     Unknown reaction    Pantoprazole Sodium      unspecified   Adhesive [Tape] Rash   Medications Reviewed Today     Reviewed by Gayla Medicus, RN (Registered Nurse) on 02/27/22 at 1321  Med List Status: <None>   Medication Order Taking? Sig Documenting Provider Last Dose Status Informant  albuterol (PROVENTIL) (2.5 MG/3ML) 0.083% nebulizer solution [id number]  Take 3 mLs (2.5 mg total) by nebulization every 4 (four) hours as needed. USE 1 VIAL VIA NEBULIZER EVERY 4 TO 6 HOURS AS NEEDED FOR COUGH OR WHEEZING Kozlow, Donnamarie Poag, MD  Active   albuterol (VENTOLIN HFA) 108 (90 Base) MCG/ACT inhaler [id number]  Inhale 2 puffs into the lungs every 4 (four) hours as needed. Kozlow, Donnamarie Poag, MD  Active   Ascorbic Acid (VITAMIN C) 1000 MG tablet [id number] No Take 1,000 mg by mouth daily. [provider] Taking Active Self           Med Note Kelsey Bullock, MONCHELL R   Thu Nov 25, 2020  1:40 PM)    cetirizine (ZYRTEC) 10 MG tablet [id number]  Take 1 tablet (10 mg total) by mouth 2 (two) times daily as needed for allergies (Can take an extra dose for breakthrough symptoms). Kozlow, Donnamarie Poag, MD  Active   clonazePAM (KLONOPIN) 0.5 MG tablet [id number] No TAKE 1 TABLET(0.5 MG) BY MOUTH AT BEDTIME Hensel, [name], MD Taking Active Self  Cobalamin Combinations (B12 FOLATE PO) [id number] No Take 1 tablet by mouth daily. [provider] Taking Active Self  COLLAGEN PO [id number] No Take 1 Scoop by mouth daily. [provider] Taking Active Self  Cranberry 1000 MG CAPS [id number] No Take 1,000  mg by mouth daily. [provider] Taking Active Self  cyclobenzaprine (FLEXERIL) 10 MG tablet [id number] No Take 1 tablet (10 mg total) by mouth 3 (three) times daily as needed for muscle spasms. Kelsey Resides, MD Taking Active   diclofenac Sodium (VOLTAREN) 1 % GEL [id number] No Apply 4 g topically 4 (four) times daily. [name], MD Taking Active   EPIPEN 2-PAK 0.3 MG/0.3ML SOAJ injection [id number]  Inject 0.3 mg into the muscle as needed for  anaphylaxis. Kozlow, Donnamarie Poag, MD  Active   famotidine (PEPCID) 40 MG tablet [id number]  Take 1 tablet (40 mg total) by mouth daily. Kozlow, Donnamarie Poag, MD  Active   fluticasone [facility]) 50 MCG/ACT nasal spray [id number]  SHAKE LIQUID AND USE 2 SPRAYS IN EACH NOSTRIL DAILY Kozlow, Donnamarie Poag, MD  Active   ibuprofen (ADVIL) 600 MG tablet [id number] No Take 1 tablet (600 mg total) by mouth every 6 (six) hours as needed. Melynda Ripple, MD Taking Active   ipratropium (ATROVENT) 0.06 % nasal spray [id number]  USE 2 SPRAYS INTO EACH NOSTRIL EVERY 6 HOURS AS NEEDED FOR TO DRY UP NOSE. Kozlow, Donnamarie Poag, MD  Active   linaclotide Rolan Lipa) 72 MCG capsule [id number] No Take 144 mcg by mouth daily before breakfast. [provider] Taking Active Self  mometasone-formoterol (DULERA) 200-5 MCG/ACT AERO [id number]  Inhale 2 puffs into the lungs 2 (two) times daily. Kozlow, Donnamarie Poag, MD  Active   montelukast (SINGULAIR) 10 MG tablet [id number] No Take one tablet by mouth at bedtime Kelsey Resides, MD Taking Active   mupirocin ointment (BACTROBAN) 2 % [id number]  Apply 1 Application topically 2 (two) times daily. Raspet, Derry Skill, PA-C  Active   norethindrone (AYGESTIN) 5 MG tablet [id number] No Take 2.5 mg by mouth at bedtime. [provider] Taking Active Self  nystatin powder [id number] No APPLY TOPICALLY TO THE AFFECTED AREA THREE TIMES DAILY Hensel, [name], MD Taking Active   omalizumab [name]) injection 300 mg [id number]   Jiles Prows, MD  Active   polyethylene glycol powder (GLYCOLAX/MIRALAX) 17 GM/SCOOP powder [id number] No Take 17 g by mouth 2 (two) times daily as needed. Kelsey Resides, MD Taking Active Self  Probiotic Product (PROBIOTIC DAILY PO) [id number] No Take 1 capsule by mouth daily. [provider] Taking Active Self  Pyridoxine HCl (B-6) 50 MG TABS [id number] No Take 50 mg by mouth daily. [provider] Taking Active Self  terconazole (TERAZOL 7) 0.4 % vaginal cream  [id number]  Place 1 applicator vaginally at bedtime. Raspet, Derry Skill, PA-C  Active   triamcinolone (KENALOG) 0.025 % ointment [id number] No Apply topically 2 (two) times daily. Kelsey Resides, MD Taking Active   Vitamin E 100 units TABS [id number] No Take 100 Units by mouth daily. [provider] Taking Active Self  XOLAIR 150 MG injection [id number] No INJECT 300 MG UNDER THE SKIN EVERY 28 DAYS Kozlow, Donnamarie Poag, MD Taking Active            Patient Active Problem List   Diagnosis Date Noted   Trigger finger of right thumb 09/06/2021   Primary osteoarthritis of left knee 08/15/2021   Eustachian tube dysfunction, bilateral 07/18/2021   Vaginal pruritus 07/18/2021   Photosensitivity dermatitis 01/06/2021   Recurrent ventral incisional hernia 11/25/2020   Ventral hernia 10/21/2020   Polypharmacy 10/21/2020   Dehydration 04/19/2020   Pap smear for cervical cancer screening 02/25/2020   Constipation 03/22/2019   Hx of adenomatous colonic  polyps    Pilonidal cyst 72/53/6644   Diastolic CHF with preserved left ventricular function, NYHA class 2 ([place]) 12/14/2016   GERD (gastroesophageal reflux disease) 02/06/2015   Severe persistent asthma 02/06/2015   Intertrigo 10/22/2014   Depression, major, recurrent ([place]) 02/19/2014   Sleep apnea 03/10/2009   Seborrheic eczema 07/17/2007   LOW BACK PAIN SYNDROME 11/26/2006   HYPERCHOLESTEROLEMIA 08/02/2006   Morbid obesity ([place]) 08/02/2006   Anxiety state 08/02/2006   PSEUDOTUMOR CEREBRI 08/02/2006   OSTEOARTHRITIS, LOWER LEG 08/02/2006   FIBROMYALGIA, FIBROMYOSITIS 08/02/2006   Conditions to be addressed/monitored per PCP order:  hronic healthcare management needs,CHF, GERD, asthma, osteoarthritis, constipation, sleep apnea   Care Plan : RN Care Manager Plan of Care  Updates made by Gayla Medicus, RN since 02/27/2022 12:00 AM     Problem: Health Promotion or Disease Self-Management (General Plan of Care)      Long-Range Goal:  Chronic Disease Management and Care Coordination Needs   Start Date: 02/27/2022  Expected End Date: 05/29/2022  Priority: High  Note:   Current Barriers:  Knowledge Deficits related to plan of care for management of CHF, GERD, asthma, osteoarthritis, constipation, sleep apnea Care Coordination needs related to Transportation and Housing barriers Chronic Disease Management support and education needs related to CHF, GERD, Asthma,osteoarthritis, constipation, sleep apnea.  RNCM Clinical Goal(s):  Patient will verbalize understanding of plan for management of CHF, GERD, asthma, osteoarthritis, constipation, sleep apnea as evidenced by patient report verbalize basic understanding of  CHF, GERD, asthma, osteoarthritis, constipation, sleep apnea disease process and self health management plan as evidenced by patient report take all medications exactly as prescribed and will call provider for medication related questions as evidenced by patient report demonstrate understanding of rationale for each prescribed medication as evidenced by patient report attend all scheduled medical appointments:as evidenced by patient report continue to work with RN Care Manager to address care management and care coordination needs related to  CHF, GERD, asthma, osteoarthritis, constipation, sleep apnea as evidenced by adherence to CM Team Scheduled appointments through collaboration with RN Care manager, provider, and care team.   Interventions: Inter-disciplinary care team collaboration (see longitudinal plan of care) Evaluation of current treatment plan related to  self management and patient's adherence to plan as established by provider  Asthma: (Status:New goal.) Long Term Goal Provided patient with basic written and verbal Asthma education on self care/management/and exacerbation prevention Advised patient to track and manage Asthma triggers Provided education about and advised patient to utilize infection  prevention strategies to reduce risk of respiratory infection Discussed the importance of adequate rest and management of fatigue with Asthma Assessed social determinant of health barriers   CAD Interventions: (Status:  New goal.) Long Term Goal Assessed understanding of CAD diagnosis Medications reviewed including medications utilized in CAD treatment plan Provided education on importance of blood pressure control in management of CAD Provided education on Importance of limiting foods high in cholesterol Counseled on importance of regular laboratory monitoring as prescribed Reviewed Importance of taking all medications as prescribed Reviewed Importance of attending all scheduled provider appointments Advised to report any changes in symptoms or exercise tolerance Assessed social determinant of health barriers  Heart Failure Interventions:  (Status:  New goal.) Long Term Goal Basic overview and discussion of pathophysiology of Heart Failure reviewed Discussed the importance of keeping all appointments with provider Assessed social determinant of health barriers     (Status:  New goal.)  Long Term Goal Evaluation of current treatment plan related  to  CHF, GERD, asthma, osteoarthritis, constipation, sleep apnea , Transportation and Housing barriers self-management and patient's adherence to plan as established by provider. Discussed plans with patient for ongoing care management follow up and provided patient with direct contact information for care management team Evaluation of current treatment plan related to chronic health conditions  and patient's adherence to plan as established by provider Advised patient to provide appropriate vaccination information to provider or CM team member at next visit Advised patient to contact Legal Aid as a resource Reviewed medications with patient  Provided patient with information about Legal Aid and transportation Reviewed scheduled/upcoming provider  appointments Discussed plans with patient for ongoing care management follow up and provided patient with direct contact information for care management team  SDOH Barriers (Status:  New goal.) Long Term Goal Patient interviewed and SDOH assessment performed        Patient interviewed and appropriate assessments performed Provided patient with information about Legal Aid and [facility] Transportation services. Discussed plans with patient for ongoing care management follow up and provided patient with direct contact information for care management team  Patient Goals/Self-Care Activities: Take all medications as prescribed Attend all scheduled provider appointments Call pharmacy for medication refills 3-7 days in advance of running out of medications Perform all self care activities independently  Perform IADL's (shopping, preparing meals, housekeeping, managing finances) independently Call provider office for new concerns or questions   Follow Up Plan:  The patient has been provided with contact information for the care management team and has been advised to call with any health related questions or concerns.  The care management team will reach out to the patient again over the next 30 business  days.    Long-Range Goal: Establish Plan of Care for Chronic Disease Management Needs   Priority: High  Note:   Timeframe:  Long-Range Goal Priority:  High Start Date:   02/27/22                          Expected End Date:ongoing                       Follow Up Date 04/03/22    - practice safe sex - schedule appointment for flu shot - schedule appointment for vaccines needed due to my age or health - schedule recommended health tests (blood work, mammogram, colonoscopy, pap test) - schedule and keep appointment for annual check-up    Why is this important?   Screening tests can find diseases early when they are easier to treat.  Your doctor or nurse will talk with you about which tests are  important for you.  Getting shots for common diseases like the flu and shingles will help prevent them.     Follow Up:  Patient agrees to Care Plan and Follow-up.  Plan: The Managed Medicaid care management team will reach out to the patient again over the next 30 business  days. and The  Patient has been provided with contact information for the Managed Medicaid care management team and has been advised to call with any health related questions or concerns.  Date/time of next scheduled RN care management/care coordination outreach:  04/03/22 at 0900.

## 2022-03-12 ENCOUNTER — Encounter (HOSPITAL_COMMUNITY): Payer: Self-pay

## 2022-03-12 ENCOUNTER — Ambulatory Visit (HOSPITAL_COMMUNITY)
Admission: EM | Admit: 2022-03-12 | Discharge: 2022-03-12 | Disposition: A | Discharge status: Home / Self Care | Payer: Medicaid Other | Attending: Physician Assistant | Admitting: Physician Assistant

## 2022-03-12 DIAGNOSIS — B3731 Acute candidiasis of vulva and vagina: Secondary | ICD-10-CM | POA: Diagnosis not present

## 2022-03-12 LAB — POCT URINALYSIS DIPSTICK, ED / UC
Bilirubin Urine: NEGATIVE
Glucose, UA: NEGATIVE mg/dL
Hgb urine dipstick: NEGATIVE
Ketones, ur: NEGATIVE mg/dL
Leukocytes,Ua: NEGATIVE
Nitrite: NEGATIVE
Protein, ur: NEGATIVE mg/dL
Specific Gravity, Urine: 1.01 (ref 1.005–1.030)
Urobilinogen, UA: 0.2 mg/dL (ref 0.0–1.0)
pH: 6 (ref 5.0–8.0)

## 2022-03-12 MED ORDER — FLUCONAZOLE 150 MG PO TABS: 150.0000 mg | ORAL_TABLET | Freq: Every day | ORAL | 0 refills | Status: AC

## 2022-03-12 NOTE — ED Triage Notes (Signed)
Pt states burning and itching with urination and foul smelling urine for over a week.

## 2022-03-12 NOTE — ED Provider Notes (Signed)
[place]    CSN: [id number] Arrival date & time: 03/12/22  1221      History   Chief Complaint Chief Complaint  Patient presents with   Dysuria    HPI Kelsey Bullock is a 61 y.o. female.   Pt complains of vaginal itching and burning that started about one month ago.  Reports foul smelling urine for the last week.  She denies dysuria, increased vaginal discharge, pelvic pain, fever, chills.  She has tried nothing for the sx.     Past Medical History:  Diagnosis Date   Allergic rhinitis    Anemia    Anxiety    Arthritis    knees and back   Asthma    Complication of anesthesia    Congestive heart failure (CHF) ([place])    Depression    Gall stone    GERD (gastroesophageal reflux disease)    Headache    migraines, stress headaches   History of kidney stones    Hypertension    Hyperthyroidism    during pregnancy   Pneumonia    PONV (postoperative nausea and vomiting)    Recurrent upper respiratory infection (URI)    Sleep apnea    can't use Cpap    Patient Active Problem List   Diagnosis Date Noted   Trigger finger of right thumb 09/06/2021   Primary osteoarthritis of left knee 08/15/2021   Eustachian tube dysfunction, bilateral 07/18/2021   Vaginal pruritus 07/18/2021   Photosensitivity dermatitis 01/06/2021   Recurrent ventral incisional hernia 11/25/2020   Ventral hernia 10/21/2020   Polypharmacy 10/21/2020   Dehydration 04/19/2020   Pap smear for cervical cancer screening 02/25/2020   Constipation 03/22/2019   Hx of adenomatous colonic polyps    Pilonidal cyst 25/10/3974   Diastolic CHF with preserved left ventricular function, NYHA class 2 ([place]) 12/14/2016   GERD (gastroesophageal reflux disease) 02/06/2015   Severe persistent asthma 02/06/2015   Intertrigo 10/22/2014   Depression, major, recurrent ([place]) 02/19/2014   Sleep apnea 03/10/2009   Seborrheic eczema 07/17/2007   LOW BACK PAIN SYNDROME 11/26/2006   HYPERCHOLESTEROLEMIA  08/02/2006   Morbid obesity ([place]) 08/02/2006   Anxiety state 08/02/2006   PSEUDOTUMOR CEREBRI 08/02/2006   OSTEOARTHRITIS, LOWER LEG 08/02/2006   FIBROMYALGIA, FIBROMYOSITIS 08/02/2006    Past Surgical History:  Procedure Laterality Date   ADENOIDECTOMY     APPLICATION OF WOUND VAC  73/41/9379   APPLICATION OF WOUND VAC N/A 03/18/2019   Procedure: Application Of Wound Vac;  Surgeon: Donnie Mesa, MD;  Location: [facility];  Service: General;  Laterality: N/A;   BIOPSY  05/27/2018   Procedure: BIOPSY;  Surgeon: Mauri Pole, MD;  [facility];  Service: Endoscopy;;   CESAREAN SECTION     COLONOSCOPY WITH PROPOFOL N/A 05/27/2018   Procedure: COLONOSCOPY WITH PROPOFOL;  Surgeon: Mauri Pole, MD;  [facility];  Service: Endoscopy;  Laterality: N/A;   CYST EXCISION     EXPLORATORY LAPAROTOMY  02/03/2019   INGUINAL HERNIA REPAIR N/A 02/03/2019   Procedure: EXPLORATORY LAPAROTOMY WITH SMALL BOWEL RESECTION AND PRIMARY CLOSURE OF VENTRAL HERNIA;  Surgeon: Donnie Mesa, MD;  Location: [facility];  Service: General;  Laterality: N/A;   INSERTION OF MESH N/A 11/25/2020   Procedure: INSERTION OF MESH;  Surgeon: Donnie Mesa, MD;  Location: [facility];  Service: General;  Laterality: N/A;   OVARIAN CYST REMOVAL     POLYPECTOMY  05/27/2018   Procedure: POLYPECTOMY;  Surgeon: Mauri Pole, MD;  [facility];  Service: Endoscopy;;   removal of gallstones     TONSILLECTOMY     TYMPANOSTOMY TUBE PLACEMENT     VENTRAL HERNIA REPAIR N/A 11/25/2020   Procedure: RECURRENT VENTRAL HERNIA REPAIR WITH MESH;  Surgeon: Donnie Mesa, MD;  Location: [facility];  Service: General;  Laterality: N/A;   WOUND EXPLORATION  03/18/2019   abdominal   WOUND EXPLORATION N/A 03/18/2019   Procedure: ABDOMINAL WOUND EXPLORATION;  Surgeon: Donnie Mesa, MD;  Location: [facility];  Service: General;  Laterality: N/A;    OB History   No obstetric history on file.      Home  Medications    Prior to Admission medications   Medication Sig Start Date End Date Taking? Authorizing Provider  fluconazole (DIFLUCAN) 150 MG tablet Take 1 tablet (150 mg total) by mouth daily for 1 day. Take 1 tablet by mouth, if no improvement after 72 hours can take the second tablet by mouth. 03/12/22 03/13/22 Yes Ward, Lenise Arena, PA-C  albuterol (PROVENTIL) (2.5 MG/3ML) 0.083% nebulizer solution Take 3 mLs (2.5 mg total) by nebulization every 4 (four) hours as needed. USE 1 VIAL VIA NEBULIZER EVERY 4 TO 6 HOURS AS NEEDED FOR COUGH OR WHEEZING 09/20/21   Kozlow, Donnamarie Poag, MD  albuterol (VENTOLIN HFA) 108 (90 Base) MCG/ACT inhaler Inhale 2 puffs into the lungs every 4 (four) hours as needed. 09/20/21   Kozlow, Donnamarie Poag, MD  Ascorbic Acid (VITAMIN C) 1000 MG tablet Take 1,000 mg by mouth daily.    [provider]  cetirizine (ZYRTEC) 10 MG tablet Take 1 tablet (10 mg total) by mouth 2 (two) times daily as needed for allergies (Can take an extra dose for breakthrough symptoms). 09/20/21   Kozlow, Donnamarie Poag, MD  clonazePAM (KLONOPIN) 0.5 MG tablet TAKE 1 TABLET(0.5 MG) BY MOUTH AT BEDTIME 11/16/20   [name], MD  Cobalamin Combinations (B12 FOLATE PO) Take 1 tablet by mouth daily.    [provider]  COLLAGEN PO Take 1 Scoop by mouth daily.    [provider]  Cranberry 1000 MG CAPS Take 1,000 mg by mouth daily.    [provider]  cyclobenzaprine (FLEXERIL) 10 MG tablet Take 1 tablet (10 mg total) by mouth 3 (three) times daily as needed for muscle spasms. 06/30/21   [name], MD  diclofenac Sodium (VOLTAREN) 1 % GEL Apply 4 g topically 4 (four) times daily. 08/15/21   [name], MD  EPIPEN 2-PAK 0.3 MG/0.3ML SOAJ injection Inject 0.3 mg into the muscle as needed for anaphylaxis. 09/20/21   Kozlow, Donnamarie Poag, MD  famotidine (PEPCID) 40 MG tablet Take 1 tablet (40 mg total) by mouth daily. 09/20/21   Kozlow, Donnamarie Poag, MD  fluticasone (FLONASE) 50 MCG/ACT nasal  spray SHAKE LIQUID AND USE 2 SPRAYS IN EACH NOSTRIL DAILY 09/20/21   Kozlow, Donnamarie Poag, MD  ibuprofen (ADVIL) 600 MG tablet Take 1 tablet (600 mg total) by mouth every 6 (six) hours as needed. 04/29/21   Melynda Ripple, MD  ipratropium (ATROVENT) 0.06 % nasal spray USE 2 SPRAYS INTO EACH NOSTRIL EVERY 6 HOURS AS NEEDED FOR TO DRY UP NOSE. 11/21/21   Kozlow, Donnamarie Poag, MD  linaclotide [facility]) 72 MCG capsule Take 144 mcg by mouth daily before breakfast. 10/15/20   [provider]  mometasone-formoterol (DULERA) 200-5 MCG/ACT AERO Inhale 2 puffs into the lungs 2 (two) times daily. 09/20/21   Kozlow, Donnamarie Poag, MD  montelukast (SINGULAIR) 10 MG  tablet Take one tablet by mouth at bedtime 06/30/21   [name], MD  mupirocin ointment (BACTROBAN) 2 % Apply 1 Application topically 2 (two) times daily. 02/10/22   Raspet, Derry Skill, PA-C  norethindrone (AYGESTIN) 5 MG tablet Take 2.5 mg by mouth at bedtime. 02/11/19   [provider]  nystatin powder APPLY TOPICALLY TO THE AFFECTED AREA THREE TIMES DAILY 01/05/21   [name], MD  polyethylene glycol powder (GLYCOLAX/MIRALAX) 17 GM/SCOOP powder Take 17 g by mouth 2 (two) times daily as needed. 09/01/19   [name], MD  Probiotic Product (PROBIOTIC DAILY PO) Take 1 capsule by mouth daily.    [provider]  Pyridoxine HCl (B-6) 50 MG TABS Take 50 mg by mouth daily.    [provider]  terconazole (TERAZOL 7) 0.4 % vaginal cream Place 1 applicator vaginally at bedtime. 02/10/22   Raspet, Derry Skill, PA-C  triamcinolone (KENALOG) 0.025 % ointment Apply topically 2 (two) times daily. 07/18/21   [name], MD  Vitamin E 100 units TABS Take 100 Units by mouth daily.    [provider]  [name] 150 MG injection INJECT 300 MG UNDER THE SKIN EVERY 98 DAYS 04/07/21   Kozlow, Donnamarie Poag, MD    Family History Family History  Adopted: Yes  Problem Relation Age of Onset   Diabetes Mother    Diabetes Paternal Uncle     Allergic rhinitis Neg Hx    Angioedema Neg Hx    Asthma Neg Hx    Atopy Neg Hx    Eczema Neg Hx    Immunodeficiency Neg Hx    Urticaria Neg Hx     Social History Social History   Tobacco Use   Smoking status: Never   Smokeless tobacco: Never  Vaping Use   Vaping Use: Never used  Substance Use Topics   Alcohol use: No   Drug use: No     Allergies   Amoxicillin, Etodolac, Penicillins, Latex, Pantoprazole sodium, Wound dressing adhesive, Ampicillin, Augmentin [amoxicillin-pot clavulanate], Diclofenac sodium, Pantoprazole sodium, and Adhesive [tape]   Review of Systems Review of Systems  Constitutional:  Negative for chills and fever.  HENT:  Negative for ear pain and sore throat.   Eyes:  Negative for pain and visual disturbance.  Respiratory:  Negative for cough and shortness of breath.   Cardiovascular:  Negative for chest pain and palpitations.  Gastrointestinal:  Negative for abdominal pain and vomiting.  Genitourinary:  Negative for dysuria and hematuria.       Vaginal itching  Musculoskeletal:  Negative for arthralgias and back pain.  Skin:  Negative for color change and rash.  Neurological:  Negative for seizures and syncope.  All other systems reviewed and are negative.    Physical Exam Triage Vital Signs ED Triage Vitals [03/12/22 1249]  Enc Vitals Group     BP 137/72     Pulse Rate 65     Resp 16     Temp 98.4 F (36.9 C)     Temp Source Oral     SpO2 99 %     Weight      Height      Head Circumference      Peak Flow      Pain Score 0     Pain Loc      Pain Edu?      Excl. in [place]?    No data found.  Updated Vital Signs BP 137/72 (BP Location:  Left Arm)   Pulse 65   Temp 98.4 F (36.9 C) (Oral)   Resp 16   LMP 02/03/2019 Comment: Tubal Ligation  SpO2 99%   Visual Acuity Right Eye Distance:   Left Eye Distance:   Bilateral Distance:    Right Eye Near:   Left Eye Near:    Bilateral Near:     Physical Exam Vitals and nursing note  reviewed.  Constitutional:      General: She is not in acute distress.    Appearance: She is well-developed.  HENT:     Head: Normocephalic and atraumatic.  Eyes:     Conjunctiva/sclera: Conjunctivae normal.  Cardiovascular:     Rate and Rhythm: Normal rate and regular rhythm.     Heart sounds: No murmur heard. Pulmonary:     Effort: Pulmonary effort is normal. No respiratory distress.     Breath sounds: Normal breath sounds.  Abdominal:     Palpations: Abdomen is soft.     Tenderness: There is no abdominal tenderness.  Musculoskeletal:        General: No swelling.     Cervical back: Neck supple.  Skin:    General: Skin is warm and dry.     Capillary Refill: Capillary refill takes less than 2 seconds.  Neurological:     Mental Status: She is alert.  Psychiatric:        Mood and Affect: Mood normal.      UC Treatments / Results  Labs (all labs ordered are listed, but only abnormal results are displayed) Labs Reviewed  URINE CULTURE  POCT URINALYSIS DIPSTICK, ED / UC  CERVICOVAGINAL ANCILLARY ONLY    EKG   Radiology No results found.  Procedures Procedures (including critical care time)  Medications Ordered in UC Medications - No data to display  Initial Impression / Assessment and Plan / UC Course  I have reviewed the triage vital signs and the nursing notes.  Pertinent labs & imaging results that were available during my care of the patient were reviewed by me and considered in my medical decision making (see chart for details).     Will treat for presumptive vaginal yeast given itching and burning.  UA with no signs of UTI.  Cervicovaginal self swab in clinic today.  Will change treatment plan if indicated based on results.   Final Clinical Impressions(s) / UC Diagnoses   Final diagnoses:  Vaginal yeast infection     Discharge Instructions      Take diflucan as prescribed Will call with test results and change treatment plan if indicated. Can  follow up with Primary Care Physician if no improvement     ED Prescriptions     Medication Sig Dispense Auth. Provider   fluconazole (DIFLUCAN) 150 MG tablet Take 1 tablet (150 mg total) by mouth daily for 1 day. Take 1 tablet by mouth, if no improvement after 72 hours can take the second tablet by mouth. 1 tablet Ward, Lenise Arena, PA-C      PDMP not reviewed this encounter.   Ward, Lenise Arena, PA-C 03/12/22 1347

## 2022-03-12 NOTE — Discharge Instructions (Signed)
Take diflucan as prescribed Will call with test results and change treatment plan if indicated. Can follow up with Primary Care Physician if no improvement

## 2022-03-13 LAB — CERVICOVAGINAL ANCILLARY ONLY
Bacterial Vaginitis (gardnerella): NEGATIVE
Candida Glabrata: NEGATIVE
Candida Vaginitis: NEGATIVE
Chlamydia: NEGATIVE
Comment: NEGATIVE
Comment: NEGATIVE
Comment: NEGATIVE
Comment: NEGATIVE
Comment: NEGATIVE
Comment: NORMAL
Neisseria Gonorrhea: NEGATIVE
Trichomonas: NEGATIVE

## 2022-03-23 ENCOUNTER — Ambulatory Visit: Payer: Medicaid Other | Admitting: Family Medicine

## 2022-03-23 ENCOUNTER — Ambulatory Visit (INDEPENDENT_AMBULATORY_CARE_PROVIDER_SITE_OTHER): Payer: Medicaid Other

## 2022-03-23 DIAGNOSIS — Z23 Encounter for immunization: Secondary | ICD-10-CM | POA: Diagnosis not present

## 2022-03-24 ENCOUNTER — Ambulatory Visit: Payer: Medicaid Other

## 2022-03-28 ENCOUNTER — Ambulatory Visit: Payer: Medicaid Other | Admitting: Allergy and Immunology

## 2022-03-28 ENCOUNTER — Encounter: Payer: Self-pay | Admitting: Allergy and Immunology

## 2022-03-28 ENCOUNTER — Ambulatory Visit: Payer: Medicaid Other

## 2022-03-28 VITALS — BP 118/78 | HR 70 | Temp 98.7°F | Resp 20

## 2022-03-28 DIAGNOSIS — J4550 Severe persistent asthma, uncomplicated: Secondary | ICD-10-CM | POA: Diagnosis not present

## 2022-03-28 DIAGNOSIS — K219 Gastro-esophageal reflux disease without esophagitis: Secondary | ICD-10-CM

## 2022-03-28 DIAGNOSIS — J3089 Other allergic rhinitis: Secondary | ICD-10-CM

## 2022-03-28 MED ORDER — DULERA 200-5 MCG/ACT IN AERO: 2.0000 | INHALATION_SPRAY | Freq: Two times a day (BID) | RESPIRATORY_TRACT | 5 refills | Status: DC | PRN

## 2022-03-28 MED ORDER — MONTELUKAST SODIUM 10 MG PO TABS: ORAL_TABLET | ORAL | 3 refills | Status: DC

## 2022-03-28 MED ORDER — FAMOTIDINE 20 MG PO TABS: 20.0000 mg | ORAL_TABLET | Freq: Every day | ORAL | 3 refills | Status: DC

## 2022-03-28 MED ORDER — FLUTICASONE PROPIONATE 50 MCG/ACT NA SUSP: 1.0000 | Freq: Every day | NASAL | 5 refills | Status: DC | PRN

## 2022-03-28 MED ORDER — EPIPEN 2-PAK 0.3 MG/0.3ML IJ SOAJ: 0.3000 mg | INTRAMUSCULAR | 1 refills | Status: DC | PRN

## 2022-03-28 MED ORDER — CETIRIZINE HCL 10 MG PO TABS: 10.0000 mg | ORAL_TABLET | Freq: Two times a day (BID) | ORAL | 5 refills | Status: DC | PRN

## 2022-03-28 MED ORDER — ALBUTEROL SULFATE HFA 108 (90 BASE) MCG/ACT IN AERS: 2.0000 | INHALATION_SPRAY | RESPIRATORY_TRACT | 1 refills | Status: DC | PRN

## 2022-03-28 NOTE — Progress Notes (Unsigned)
[place]   Follow-up Note   Referring Provider: [name], MD Primary Provider: [name], MD Date of Office Visit: 03/28/2022  Subjective:   Kelsey Bullock (DOB: 1961/04/15) is a 61 y.o. female who returns to [facility] on 03/28/2022 in re-evaluation of the following:  HPI: Ranell returns to this clinic in evaluation of asthma, allergic rhinitis, ETD, suspected obesity hypoventilation syndrome, and reflux.  I last saw her in this clinic on 20 September 2021.  Overall she is doing very well with her airway issue.  She has not required a systemic steroid or an antibiotic for any type of airway problem.  She continues to use Xolair but has stopped her montelukast and has stopped her inhaled steroid although she continues to use her omalizumab injections.  She is not really exercising very much at this point.  She does not need to use a short acting bronchodilator.  Her reflux has been active.  She is not using any famotidine.  She has had her flu vaccine administered.  Allergies as of 03/28/2022       Reactions   Amoxicillin Anaphylaxis, Swelling   Etodolac Anaphylaxis, Shortness Of Breath    lips swelled  lips swelled  lips swelled   Penicillins Anaphylaxis, Swelling, Rash   DID THE REACTION INVOLVE: Swelling of the face/tongue/throat, SOB, or low BP? Yes Sudden or severe rash/hives, skin peeling, or the inside of the mouth or nose? No Did it require medical treatment? No When did it last happen?      20 years ago If all above answers are "NO", may proceed with cephalosporin use.   Latex Rash, Hives   Pantoprazole Sodium Hives   unspecified REACTION: unspecified   Wound Dressing Adhesive Rash   Ampicillin Swelling   Augmentin [amoxicillin-pot Clavulanate] Swelling   Diclofenac Sodium    Unknown reaction    Pantoprazole Sodium    unspecified   Adhesive [tape] Rash        Medication  List    albuterol (2.5 MG/3ML) 0.083% nebulizer solution Commonly known as: PROVENTIL Take 3 mLs (2.5 mg total) by nebulization every 4 (four) hours as needed. USE 1 VIAL VIA NEBULIZER EVERY 4 TO 6 HOURS AS NEEDED FOR COUGH OR WHEEZING   albuterol 108 (90 Base) MCG/ACT inhaler Commonly known as: VENTOLIN HFA Inhale 2 puffs into the lungs every 4 (four) hours as needed.   B-6 50 MG Tabs Take 50 mg by mouth daily.   B12 FOLATE PO Take 1 tablet by mouth daily.   cetirizine 10 MG tablet Commonly known as: ZYRTEC Take 1 tablet (10 mg total) by mouth 2 (two) times daily as needed for allergies (Can take an extra dose for breakthrough symptoms).   clonazePAM 0.5 MG tablet Commonly known as: KLONOPIN TAKE 1 TABLET(0.5 MG) BY MOUTH AT BEDTIME   COLLAGEN PO Take 1 Scoop by mouth daily.   Cranberry 1000 MG Caps Take 1,000 mg by mouth daily.   cyclobenzaprine 10 MG tablet Commonly known as: FLEXERIL Take 1 tablet (10 mg total) by mouth 3 (three) times daily as needed for muscle spasms.   diclofenac Sodium 1 % Gel Commonly known as: VOLTAREN Apply 4 g topically 4 (four) times daily.   Dulera 200-5 MCG/ACT Aero Generic drug: mometasone-formoterol Inhale 2 puffs into the lungs 2 (two) times daily.   EpiPen 2-Pak 0.3 mg/0.3 mL Soaj injection Generic drug: EPINEPHrine Inject 0.3  mg into the muscle as needed for anaphylaxis.   famotidine 40 MG tablet Commonly known as: PEPCID Take 1 tablet (40 mg total) by mouth daily.   fluticasone 50 MCG/ACT nasal spray Commonly known as: FLONASE SHAKE LIQUID AND USE 2 SPRAYS IN EACH NOSTRIL DAILY   ibuprofen 600 MG tablet Commonly known as: ADVIL Take 1 tablet (600 mg total) by mouth every 6 (six) hours as needed.   ipratropium 0.06 % nasal spray Commonly known as: ATROVENT USE 2 SPRAYS INTO EACH NOSTRIL EVERY 6 HOURS AS NEEDED FOR TO DRY UP NOSE.   linaclotide 72 MCG capsule Commonly known as: LINZESS Take 144 mcg by mouth daily  before breakfast.   montelukast 10 MG tablet Commonly known as: SINGULAIR Take one tablet by mouth at bedtime   mupirocin ointment 2 % Commonly known as: BACTROBAN Apply 1 Application topically 2 (two) times daily.   norethindrone 5 MG tablet Commonly known as: AYGESTIN Take 2.5 mg by mouth at bedtime.   nystatin powder Commonly known as: nystatin APPLY TOPICALLY TO THE AFFECTED AREA THREE TIMES DAILY   polyethylene glycol powder 17 GM/SCOOP powder Commonly known as: GLYCOLAX/MIRALAX Take 17 g by mouth 2 (two) times daily as needed.   PROBIOTIC DAILY PO Take 1 capsule by mouth daily.   terconazole 0.4 % vaginal cream Commonly known as: TERAZOL 7 Place 1 applicator vaginally at bedtime.   triamcinolone 0.025 % ointment Commonly known as: KENALOG Apply topically 2 (two) times daily.   vitamin C 1000 MG tablet Take 1,000 mg by mouth daily.   Vitamin E 100 units Tabs Take 100 Units by mouth daily.   Xolair 150 MG injection Generic drug: omalizumab INJECT 300 MG UNDER THE SKIN EVERY 28 DAYS    Past Medical History:  Diagnosis Date   Allergic rhinitis    Anemia    Anxiety    Arthritis    knees and back   Asthma    Complication of anesthesia    Congestive heart failure (CHF) ([facility])    Depression    Gall stone    GERD (gastroesophageal reflux disease)    Headache    migraines, stress headaches   History of kidney stones    Hypertension    Hyperthyroidism    during pregnancy   Pneumonia    PONV (postoperative nausea and vomiting)    Recurrent upper respiratory infection (URI)    Sleep apnea    can't use Cpap    Past Surgical History:  Procedure Laterality Date   ADENOIDECTOMY     APPLICATION OF WOUND VAC  87/86/7672   APPLICATION OF WOUND VAC N/A 03/18/2019   Procedure: Application Of Wound Vac;  Surgeon: Donnie Mesa, MD;  Location: [place];  Service: General;  Laterality: N/A;   BIOPSY  05/27/2018   Procedure: BIOPSY;  Surgeon: Mauri Pole,  MD;  [facility];  Service: Endoscopy;;   CESAREAN SECTION     COLONOSCOPY WITH PROPOFOL N/A 05/27/2018   Procedure: COLONOSCOPY WITH PROPOFOL;  Surgeon: Mauri Pole, MD;  [facility];  Service: Endoscopy;  Laterality: N/A;   CYST EXCISION     EXPLORATORY LAPAROTOMY  02/03/2019   INGUINAL HERNIA REPAIR N/A 02/03/2019   Procedure: EXPLORATORY LAPAROTOMY WITH SMALL BOWEL RESECTION AND PRIMARY CLOSURE OF VENTRAL HERNIA;  Surgeon: Donnie Mesa, MD;  Location: [place];  Service: General;  Laterality: N/A;   INSERTION OF MESH N/A 11/25/2020   Procedure: INSERTION OF MESH;  Surgeon: Donnie Mesa, MD;  Location: [facility];  Service: General;  Laterality: N/A;   OVARIAN CYST REMOVAL     POLYPECTOMY  05/27/2018   Procedure: POLYPECTOMY;  Surgeon: Mauri Pole, MD;  [facility];  Service: Endoscopy;;   removal of gallstones     TONSILLECTOMY     TYMPANOSTOMY TUBE PLACEMENT     VENTRAL HERNIA REPAIR N/A 11/25/2020   Procedure: RECURRENT VENTRAL HERNIA REPAIR WITH MESH;  Surgeon: Donnie Mesa, MD;  Location: [place];  Service: General;  Laterality: N/A;   WOUND EXPLORATION  03/18/2019   abdominal   WOUND EXPLORATION N/A 03/18/2019   Procedure: ABDOMINAL WOUND EXPLORATION;  Surgeon: Donnie Mesa, MD;  Location: [place];  Service: General;  Laterality: N/A;    Review of systems negative except as noted in HPI / PMHx or noted below:  Review of Systems  Constitutional: Negative.   HENT: Negative.    Eyes: Negative.   Respiratory: Negative.    Cardiovascular: Negative.   Gastrointestinal: Negative.   Genitourinary: Negative.   Musculoskeletal: Negative.   Skin: Negative.   Neurological: Negative.   Endo/Heme/Allergies: Negative.   Psychiatric/Behavioral: Negative.       Objective:   Vitals:   03/28/22 0931  BP: 118/78  Pulse: 70  Resp: 20  Temp: 98.7 F (37.1 C)  SpO2: 99%          Physical Exam Constitutional:      Appearance: She is  not diaphoretic.  HENT:     Head: Normocephalic.     Right Ear: Tympanic membrane, ear canal and external ear normal.     Left Ear: Tympanic membrane, ear canal and external ear normal.     Nose: Nose normal. No mucosal edema or rhinorrhea.     Mouth/Throat:     Pharynx: Uvula midline. No oropharyngeal exudate.  Eyes:     Conjunctiva/sclera: Conjunctivae normal.  Neck:     Thyroid: No thyromegaly.     Trachea: Trachea normal. No tracheal tenderness or tracheal deviation.  Cardiovascular:     Rate and Rhythm: Normal rate and regular rhythm.     Heart sounds: Normal heart sounds, S1 normal and S2 normal. No murmur heard. Pulmonary:     Effort: No respiratory distress.     Breath sounds: Normal breath sounds. No stridor. No wheezing or rales.  Lymphadenopathy:     Head:     Right side of head: No tonsillar adenopathy.     Left side of head: No tonsillar adenopathy.     Cervical: No cervical adenopathy.  Skin:    Findings: No erythema or rash.     Nails: There is no clubbing.  Neurological:     Mental Status: She is alert.    Diagnostics:    Spirometry was performed and demonstrated an FEV1 of 2.35 at 96 % of predicted.  The patient had an Asthma Control Test with the following results:  .    Assessment and Plan:   1. Asthma, severe persistent, well-controlled   2. Other allergic rhinitis   3. LPRD (laryngopharyngeal reflux disease)     1. Continue Flonase 1 spray each nostril 1-7 times per week depending on upper airway and ear symptom activity  2. Continue montelukast 10 mg daily  3. Continue Xolair (Epi-Pen)  4.  If needed:   A. Pro-air HFA  B. Antihistamine   C. Famotidine 20 mg daily  E. Nasal ipratropium 0.06% - 2 sprays each nostril every 6 hours  F. Dulera 200 -2 inhalations  twice a day  5. Return to clinic in 6 months or earlier if problem  6.  Obtain RSV vaccine.  Overall Timmothy Sours is done very well with her airway issue on her current plan which includes  on the Lizza Mab as her major controller agent.  She certainly can restart Flonase and montelukast and nasal ipratropium should it be required.  As well, for her reflux/LPR she can always restart famotidine should it be required.  Assuming she does well with the plan noted above I will see her back in this clinic in 6 months or earlier if there is a problem.  Allena Katz, MD Allergy / Immunology [place]

## 2022-03-28 NOTE — Patient Instructions (Addendum)
  1. Continue Flonase 1 spray each nostril 1-7 times per week depending on upper airway and ear symptom activity  2. Continue montelukast 10 mg daily  3. Continue Xolair (Epi-Pen)  4.  If needed:   A. Pro-air HFA  B. Antihistamine   C. Famotidine 20 mg daily  E. Nasal ipratropium 0.06% - 2 sprays each nostril every 6 hours  F. Dulera 200 -2 inhalations twice a day  5. Return to clinic in 6 months or earlier if problem  6.  Obtain RSV vaccine.

## 2022-03-29 ENCOUNTER — Encounter: Payer: Self-pay | Admitting: Allergy and Immunology

## 2022-04-03 ENCOUNTER — Other Ambulatory Visit: Payer: Self-pay | Admitting: *Deleted

## 2022-04-03 ENCOUNTER — Other Ambulatory Visit: Payer: Self-pay | Admitting: Obstetrics and Gynecology

## 2022-04-03 DIAGNOSIS — E7800 Pure hypercholesterolemia, unspecified: Secondary | ICD-10-CM

## 2022-04-03 MED ORDER — OMALIZUMAB 150 MG/ML ~~LOC~~ SOSY: 300.0000 mg | PREFILLED_SYRINGE | SUBCUTANEOUS | 11 refills | Status: DC

## 2022-04-03 NOTE — Patient Instructions (Signed)
Visit Information  Kelsey Bullock was given information about Medicaid Managed Care team care coordination services as a part of their [place] benefit. Kelsey Bullock verbally consented to engagement with the [facility] Managed Care team.   If you are experiencing a medical emergency, please call 911 or report to your local emergency department or urgent care.   If you have a non-emergency medical problem during routine business hours, please contact your provider's office and ask to speak with a nurse.   For questions related to your [facility], please call: [phone] or visit the homepage here: [email or web address]  If you would like to schedule transportation through your [facility], please call the following number at least 2 days in advance of your appointment: [phone]   Rides for urgent appointments can also be made after hours by calling Member Services.  Call the [place] at [phone], at any time, 24 hours a day, 7 days a week. If you are in danger or need immediate medical attention call 911.  If you would like help to quit smoking, call 1-800-QUIT-NOW 3096909979) OR Espaol: 1-855-Djelo-Ya (9-833-825-0539) o para ms informacin haga clic aqu or Text READY to 200-400 to register via text  Kelsey Bullock - following are the goals we discussed in your visit today:   Goals Addressed    Timeframe:  Long-Range Goal Priority:  High Start Date:   02/27/22                          Expected End Date:ongoing                       Follow Up Date 05/04/22   - practice safe sex - schedule appointment for flu shot - schedule appointment for vaccines needed due to my age or health - schedule recommended health tests (blood work, mammogram, colonoscopy, pap test) - schedule and keep appointment for annual check-up     Why is this important?   Screening tests can find diseases early when they are easier to treat.  Your doctor or nurse will talk with you about which tests are important for you.  Getting shots for common diseases like the flu and shingles will help prevent them.   04/03/22:  Patient recently seen and evaluated by Dr. Neldon Mc. To see PCP 11/16  The patient verbalized understanding of instructions, educational materials, and care plan provided today and DECLINED offer to receive copy of patient instructions, educational materials, and care plan.   The Managed Medicaid care management team will reach out to the patient again over the next 30 business  days.  The  Patient  has been provided with contact information for the Managed Medicaid care management team and has been advised to call with any health related questions or concerns.   Aida Raider RN, BSN [place] Management Coordinator - Managed Medicaid High Risk [phone]   Following is a copy of your plan of care:  Care Plan : [place] of Care  Updates made by Gayla Medicus, RN since 04/03/2022 12:00 AM     Problem: Health Promotion or Disease Self-Management (General Plan of Care)      Long-Range Goal: Chronic Disease Management and Care Coordination Needs   Start Date: 02/27/2022  Expected End Date: 05/29/2022  Priority: High  Note:   Current Barriers:  Knowledge Deficits related to plan of care for management of CHF, GERD, asthma, osteoarthritis, constipation, sleep apnea Care Coordination needs related to Transportation and Housing barriers Chronic Disease Management support and education needs related to CHF, GERD, Asthma,osteoarthritis, constipation, sleep apnea. 03/24/22:  patient not taking any medications-has most of medications, not taking.  Discussed waiving of Medicaid fees and changing Pharmacy as well as speaking with Pharmacist.  Patient unsure about her current  housing situation-if she is going to have to move or not-working with [employer].  RNCM Clinical Goal(s):  Patient will verbalize understanding of plan for management of CHF, GERD, asthma, osteoarthritis, constipation, sleep apnea as evidenced by patient report verbalize basic understanding of  CHF, GERD, asthma, osteoarthritis, constipation, sleep apnea disease process and self health management plan as evidenced by patient report take all medications exactly as prescribed and will call provider for medication related questions as evidenced by patient report demonstrate understanding of rationale for each prescribed medication as evidenced by patient report attend all scheduled medical appointments as evidenced by patient report continue to work with RN Care Manager to address care management and care coordination needs related to  CHF, GERD, asthma, osteoarthritis, constipation, sleep apnea as evidenced by adherence to CM Team Scheduled appointments through collaboration with RN Care manager, provider, and care team.   Interventions: Inter-disciplinary care team collaboration (see longitudinal plan of care) Evaluation of current treatment plan related to  self management and patient's adherence to plan as established by provider Pharmacy referral for medication review Collaborated with Pharmacy BSW for food and legal resources. Collaborated with BSW  Asthma: (Status:New goal.) Long Term Goal Provided patient with basic written and verbal Asthma education on self care/management/and exacerbation prevention Advised patient to track and manage Asthma triggers Provided education about and advised patient to utilize infection prevention strategies to reduce risk of respiratory infection Discussed the importance of adequate rest and management of fatigue with Asthma Assessed social determinant of health barriers  04/03/22:  patient currently not taking medications-has medications to use as  needed.  CAD Interventions: (Status:  New goal.) Long Term Goal Assessed understanding of CAD diagnosis Medications reviewed including medications utilized in CAD treatment plan Provided education on importance of blood pressure control in management of CAD Provided education on Importance of limiting foods high in cholesterol Counseled on importance of regular laboratory monitoring as prescribed Reviewed Importance of taking all medications as prescribed Reviewed Importance of attending all scheduled provider appointments Advised to report any changes in symptoms or exercise tolerance Assessed social determinant of health barriers 03/24/22:  Patient currently not taking any of her medications.  Heart Failure Interventions:  (Status:  New goal.) Long Term Goal Basic overview and discussion of pathophysiology of Heart Failure reviewed Discussed the importance of keeping all appointments with provider Assessed social determinant of health barriers  03/24/22:  patient not currently taking any of her medications    (Status:  New goal.)  Long Term Goal Evaluation of current treatment plan related to  CHF, GERD, asthma, osteoarthritis, constipation, sleep apnea , Transportation and Housing barriers self-management and patient's adherence to plan as established by provider. Discussed plans with patient for ongoing care management follow up and provided patient with direct contact information for care management team Evaluation of current treatment plan related to chronic health conditions  and patient's adherence to plan as established by provider Advised patient to provide appropriate vaccination information to provider or CM team member at next visit Advised patient to contact [employer]  as a resource Reviewed medications with patient  Provided patient with information about [employer] and transportation Reviewed scheduled/upcoming provider appointments Discussed plans with patient for ongoing  care management follow up and provided patient with direct contact information for care management team 03/24/22:  Patient states she contacted [employer] and is currently working with them.  SDOH Barriers (Status:  New goal.) Long Term Goal Patient interviewed and SDOH assessment performed        Patient interviewed and appropriate assessments performed Provided patient with information about [employer] and [facility] Transportation services. Discussed plans with patient for ongoing care management follow up and provided patient with direct contact information for care management team  Patient Goals/Self-Care Activities: Take all medications as prescribed Attend all scheduled provider appointments Call pharmacy for medication refills 3-7 days in advance of running out of medications Perform all self care activities independently  Perform IADL's (shopping, preparing meals, housekeeping, managing finances) independently Call provider office for new concerns or questions   Follow Up Plan:  The patient has been provided with contact information for the care management team and has been advised to call with any health related questions or concerns.  The care management team will reach out to the patient again over the next 30 business  days.

## 2022-04-03 NOTE — Patient Outreach (Signed)
Medicaid Managed Care   Nurse Care Manager Note  04/03/2022 Name:  Kelsey Bullock MRN:  [id number] DOB:  1960/10/03  Kelsey Bullock is an 61 y.o. year old female who is a primary patient of Hensel, [name], MD.  The [facility] Managed Care Coordination team was consulted for assistance with:    Chronic healthcare management needs, CHF, GERD, asthma, sleep apnea, osteoarthritis, LBP, constipation, anxiety, depression  [name] was given information about Medicaid Managed Care Coordination team services today. [name] Patient agreed to services and verbal consent obtained.  Engaged with patient by telephone for follow up visit in response to provider referral for case management and/or care coordination services.   Assessments/Interventions:  Review of past medical history, allergies, medications, health status, including review of consultants reports, laboratory and other test data, was performed as part of comprehensive evaluation and provision of chronic care management services.  SDOH (Social Determinants of Health) assessments and interventions performed: SDOH Interventions    Flowsheet Row Patient Outreach Telephone from 04/03/2022 in [place] Coordination  SDOH Interventions   Food Insecurity Interventions Other (Comment)  [goes to food pantry, BSW referral]  Housing Interventions Other (Comment)  [BSW referral, legal aid resource given]       Care Plan  Allergies  Allergen Reactions   Amoxicillin Anaphylaxis and Swelling   Etodolac Anaphylaxis and Shortness Of Breath     lips swelled  lips swelled  lips swelled   Penicillins Anaphylaxis, Swelling and Rash    DID THE REACTION INVOLVE: Swelling of the face/tongue/throat, SOB, or low BP? Yes Sudden or severe rash/hives, skin peeling, or the inside of the mouth or nose? No Did it require medical treatment? No When did it last happen?      20 years ago If all above answers  are "NO", may proceed with cephalosporin use.    Latex Rash and Hives   Pantoprazole Sodium Hives    unspecified REACTION: unspecified   Wound Dressing Adhesive Rash   Ampicillin Swelling   Augmentin [Amoxicillin-Pot Clavulanate] Swelling   Diclofenac Sodium     Unknown reaction    Pantoprazole Sodium     unspecified   Adhesive [Tape] Rash   Medications Reviewed Today     Reviewed by Gayla Medicus, RN (Registered Nurse) on 04/03/22 at [phone]  Med List Status: <None>   Medication Order Taking? Sig Documenting Provider Last Dose Status Informant  albuterol (PROVENTIL) (2.5 MG/3ML) 0.083% nebulizer solution [id number] No Take 3 mLs (2.5 mg total) by nebulization every 4 (four) hours as needed. USE 1 VIAL VIA NEBULIZER EVERY 4 TO 6 HOURS AS NEEDED FOR COUGH OR WHEEZING  Patient not taking: Reported on 04/03/2022   Jiles Prows, MD Not Taking Active   albuterol (VENTOLIN HFA) 108 (90 Base) MCG/ACT inhaler [id number] No Inhale 2 puffs into the lungs every 4 (four) hours as needed.  Patient not taking: Reported on 04/03/2022   Jiles Prows, MD Not Taking Active   Ascorbic Acid (VITAMIN C) 1000 MG tablet [id number] No Take 1,000 mg by mouth daily.  Patient not taking: Reported on 04/03/2022   [provider] Not Taking Active Self           Med Note [name], MONCHELL R   Thu Nov 25, 2020  1:40 PM)    cetirizine (ZYRTEC) 10 MG tablet [id number] No Take 1 tablet (10 mg total) by mouth 2 (two) times daily as needed for allergies (Can take  an extra dose for breakthrough symptoms).  Patient not taking: Reported on 04/03/2022   Jiles Prows, MD Not Taking Active   clonazePAM (KLONOPIN) 0.5 MG tablet [id number] No TAKE 1 TABLET(0.5 MG) BY MOUTH AT BEDTIME  Patient not taking: Reported on 04/03/2022   [name], MD Not Taking Active Self  Cobalamin Combinations (B12 FOLATE PO) [id number] No Take 1 tablet by mouth daily.  Patient not taking: Reported on 04/03/2022   [provider] Not Taking Active Self  COLLAGEN PO [id number] No Take 1 Scoop by mouth daily.  Patient not taking: Reported on 04/03/2022   [provider] Not Taking Active Self  Cranberry 1000 MG CAPS [id number] No Take 1,000 mg by mouth daily.  Patient not taking: Reported on 04/03/2022   [provider] Not Taking Active Self  cyclobenzaprine (FLEXERIL) 10 MG tablet [id number] No Take 1 tablet (10 mg total) by mouth 3 (three) times daily as needed for muscle spasms.  Patient not taking: Reported on 04/03/2022   [name], MD Not Taking Active   diclofenac Sodium (VOLTAREN) 1 % GEL [id number] No Apply 4 g topically 4 (four) times daily.  Patient not taking: Reported on 04/03/2022   [name], MD Not Taking Active   EPIPEN 2-PAK 0.3 MG/0.3ML SOAJ injection [id number] No Inject 0.3 mg into the muscle as needed for anaphylaxis.  Patient not taking: Reported on 04/03/2022   Jiles Prows, MD Not Taking Active   famotidine (PEPCID) 20 MG tablet [id number] No Take 1 tablet (20 mg total) by mouth daily.  Patient not taking: Reported on 04/03/2022   Jiles Prows, MD Not Taking Active   famotidine (PEPCID) 40 MG tablet [id number] No Take 1 tablet (40 mg total) by mouth daily.  Patient not taking: Reported on 04/03/2022   Jiles Prows, MD Not Taking Active   fluticasone [facility]) 50 MCG/ACT nasal spray [id number] No Place 1 spray into both nostrils daily as needed for allergies or rhinitis.  Patient not taking: Reported on 04/03/2022   Jiles Prows, MD Not Taking Active   ibuprofen (ADVIL) 600 MG tablet [id number] No Take 1 tablet (600 mg total) by mouth every 6 (six) hours as needed.  Patient not taking: Reported on 04/03/2022   Melynda Ripple, MD Not Taking Active   ipratropium (ATROVENT) 0.06 % nasal spray [id number] No USE 2 SPRAYS INTO EACH NOSTRIL EVERY 6 HOURS AS NEEDED FOR TO DRY UP NOSE.  Patient not taking: Reported on 04/03/2022   Jiles Prows, MD Not  Taking Active   linaclotide Rolan Lipa) 72 MCG capsule [id number] No Take 144 mcg by mouth daily before breakfast.  Patient not taking: Reported on 04/03/2022   [provider] Not Taking Active Self  mometasone-formoterol (DULERA) 200-5 MCG/ACT AERO [id number] No Inhale 2 puffs into the lungs 2 (two) times daily as needed.  Patient not taking: Reported on 04/03/2022   Jiles Prows, MD Not Taking Active   montelukast (SINGULAIR) 10 MG tablet [id number] No Take one tablet by mouth at bedtime  Patient not taking: Reported on 04/03/2022   Jiles Prows, MD Not Taking Active   mupirocin ointment (BACTROBAN) 2 % [id number] No Apply 1 Application topically 2 (two) times daily.  Patient not taking: Reported on 04/03/2022   Raspet, Erin K, PA-C Not Taking Active   norethindrone (AYGESTIN) 5 MG tablet [id number] No Take 2.5 mg by mouth at bedtime.  Patient not taking: Reported on 04/03/2022  [provider] Not Taking Active Self  nystatin powder [id number] No APPLY TOPICALLY TO THE AFFECTED AREA THREE TIMES DAILY  Patient not taking: Reported on 04/03/2022   [name], MD Not Taking Active   omalizumab [name]) injection 300 mg [id number]   Jiles Prows, MD  Active   polyethylene glycol powder (GLYCOLAX/MIRALAX) 17 GM/SCOOP powder [id number] No Take 17 g by mouth 2 (two) times daily as needed.  Patient not taking: Reported on 04/03/2022   [name], MD Not Taking Active Self  Probiotic Product (PROBIOTIC DAILY PO) [id number] No Take 1 capsule by mouth daily.  Patient not taking: Reported on 04/03/2022   [provider] Not Taking Active Self  Pyridoxine HCl (B-6) 50 MG TABS [id number] No Take 50 mg by mouth daily.  Patient not taking: Reported on 04/03/2022   [provider] Not Taking Active Self  terconazole (TERAZOL 7) 0.4 % vaginal cream [id number] No Place 1 applicator vaginally at bedtime.  Patient not taking: Reported on 04/03/2022   Raspet,  Erin K, PA-C Not Taking Active   triamcinolone (KENALOG) 0.025 % ointment [id number] No Apply topically 2 (two) times daily.  Patient not taking: Reported on 04/03/2022   [name], MD Not Taking Active   Vitamin E 100 units TABS [id number] No Take 100 Units by mouth daily.  Patient not taking: Reported on 04/03/2022   [provider] Not Taking Active Self  XOLAIR 150 MG injection [id number] No INJECT 300 MG UNDER THE SKIN EVERY 28 DAYS  Patient not taking: Reported on 04/03/2022   Jiles Prows, MD Not Taking Active            Patient Active Problem List   Diagnosis Date Noted   Trigger finger of right thumb 09/06/2021   Primary osteoarthritis of left knee 08/15/2021   Eustachian tube dysfunction, bilateral 07/18/2021   Vaginal pruritus 07/18/2021   Photosensitivity dermatitis 01/06/2021   Recurrent ventral incisional hernia 11/25/2020   Ventral hernia 10/21/2020   Polypharmacy 10/21/2020   Dehydration 04/19/2020   Pap smear for cervical cancer screening 02/25/2020   Constipation 03/22/2019   Hx of adenomatous colonic polyps    Pilonidal cyst 12/75/1700   Diastolic CHF with preserved left ventricular function, NYHA class 2 ([place]) 12/14/2016   GERD (gastroesophageal reflux disease) 02/06/2015   Severe persistent asthma 02/06/2015   Intertrigo 10/22/2014   Depression, major, recurrent ([place]) 02/19/2014   Sleep apnea 03/10/2009   Seborrheic eczema 07/17/2007   LOW BACK PAIN SYNDROME 11/26/2006   HYPERCHOLESTEROLEMIA 08/02/2006   Morbid obesity ([place]) 08/02/2006   Anxiety state 08/02/2006   PSEUDOTUMOR CEREBRI 08/02/2006   OSTEOARTHRITIS, LOWER LEG 08/02/2006   FIBROMYALGIA, FIBROMYOSITIS 08/02/2006   Conditions to be addressed/monitored per PCP order:  Chronic healthcare management needs, CHF, GERD, asthma, sleep apnea, osteoarthritis, LBP, constipation, anxiety, depression, pilonidal cyst, fibromyalgia, pseudotumor cerebri  Care Plan : RN Care Manager Plan  of Care  Updates made by Gayla Medicus, RN since 04/03/2022 12:00 AM     Problem: Health Promotion or Disease Self-Management (General Plan of Care)      Long-Range Goal: Chronic Disease Management and Care Coordination Needs   Start Date: 02/27/2022  Expected End Date: 05/29/2022  Priority: High  Note:   Current Barriers:  Knowledge Deficits related to plan of care for management of CHF, GERD, asthma, osteoarthritis, constipation, sleep apnea Care Coordination needs related to Transportation and Housing barriers Chronic Disease Management support and education needs related to  CHF, GERD, Asthma,osteoarthritis, constipation, sleep apnea. 03/24/22:  patient not taking any medications-has most of medications, not taking.  Discussed waiving of Medicaid fees and changing Pharmacy as well as speaking with Pharmacist.  Patient unsure about her current housing situation-if she is going to have to move or not-working with Legal [employer].  RNCM Clinical Goal(s):  Patient will verbalize understanding of plan for management of CHF, GERD, asthma, osteoarthritis, constipation, sleep apnea as evidenced by patient report verbalize basic understanding of  CHF, GERD, asthma, osteoarthritis, constipation, sleep apnea disease process and self health management plan as evidenced by patient report take all medications exactly as prescribed and will call provider for medication related questions as evidenced by patient report demonstrate understanding of rationale for each prescribed medication as evidenced by patient report attend all scheduled medical appointments as evidenced by patient report continue to work with RN Care Manager to address care management and care coordination needs related to  CHF, GERD, asthma, osteoarthritis, constipation, sleep apnea as evidenced by adherence to CM Team Scheduled appointments through collaboration with RN Care manager, provider, and care team.    Interventions: Inter-disciplinary care team collaboration (see longitudinal plan of care) Evaluation of current treatment plan related to  self management and patient's adherence to plan as established by provider Pharmacy referral for medication review Collaborated with Pharmacy BSW for food and legal resources. Collaborated with BSW  Asthma: (Status:New goal.) Long Term Goal Provided patient with basic written and verbal Asthma education on self care/management/and exacerbation prevention Advised patient to track and manage Asthma triggers Provided education about and advised patient to utilize infection prevention strategies to reduce risk of respiratory infection Discussed the importance of adequate rest and management of fatigue with Asthma Assessed social determinant of health barriers  04/03/22:  patient currently not taking medications-has medications to use as needed.  CAD Interventions: (Status:  New goal.) Long Term Goal Assessed understanding of CAD diagnosis Medications reviewed including medications utilized in CAD treatment plan Provided education on importance of blood pressure control in management of CAD Provided education on Importance of limiting foods high in cholesterol Counseled on importance of regular laboratory monitoring as prescribed Reviewed Importance of taking all medications as prescribed Reviewed Importance of attending all scheduled provider appointments Advised to report any changes in symptoms or exercise tolerance Assessed social determinant of health barriers 03/24/22:  Patient currently not taking any of her medications.  Heart Failure Interventions:  (Status:  New goal.) Long Term Goal Basic overview and discussion of pathophysiology of Heart Failure reviewed Discussed the importance of keeping all appointments with provider Assessed social determinant of health barriers  03/24/22:  patient not currently taking any of her medications     (Status:  New goal.)  Long Term Goal Evaluation of current treatment plan related to  CHF, GERD, asthma, osteoarthritis, constipation, sleep apnea , Transportation and Housing barriers self-management and patient's adherence to plan as established by provider. Discussed plans with patient for ongoing care management follow up and provided patient with direct contact information for care management team Evaluation of current treatment plan related to chronic health conditions  and patient's adherence to plan as established by provider Advised patient to provide appropriate vaccination information to provider or CM team member at next visit Advised patient to contact Legal Aid as a resource Reviewed medications with patient  Provided patient with information about Legal Aid and transportation Reviewed scheduled/upcoming provider appointments Discussed plans with patient for ongoing care management follow up and provided patient with  direct contact information for care management team 03/24/22:  Patient states she contacted Legal Aid and is currently working with them.  SDOH Barriers (Status:  New goal.) Long Term Goal Patient interviewed and SDOH assessment performed        Patient interviewed and appropriate assessments performed Provided patient with information about Legal Aid and [facility] Transportation services. Discussed plans with patient for ongoing care management follow up and provided patient with direct contact information for care management team  Patient Goals/Self-Care Activities: Take all medications as prescribed Attend all scheduled provider appointments Call pharmacy for medication refills 3-7 days in advance of running out of medications Perform all self care activities independently  Perform IADL's (shopping, preparing meals, housekeeping, managing finances) independently Call provider office for new concerns or questions   Follow Up Plan:  The patient has been provided with  contact information for the care management team and has been advised to call with any health related questions or concerns.  The care management team will reach out to the patient again over the next 30 business  days.    Long-Range Goal: Establish Plan of Care for Chronic Disease Management Needs   Priority: High  Note:   Timeframe:  Long-Range Goal Priority:  High Start Date:   02/27/22                          Expected End Date:ongoing                       Follow Up Date 05/04/22   - practice safe sex - schedule appointment for flu shot - schedule appointment for vaccines needed due to my age or health - schedule recommended health tests (blood work, mammogram, colonoscopy, pap test) - schedule and keep appointment for annual check-up    Why is this important?   Screening tests can find diseases early when they are easier to treat.  Your doctor or nurse will talk with you about which tests are important for you.  Getting shots for common diseases like the flu and shingles will help prevent them.   04/03/22:  Patient recently seen and evaluated by Dr. Neldon Mc. To see PCP 11/16   Follow Up:  Patient agrees to Care Plan and Follow-up.  Plan: The Managed Medicaid care management team will reach out to the patient again over the next 30 business  days. and The  Patient has been provided with contact information for the Managed Medicaid care management team and has been advised to call with any health related questions or concerns.  Date/time of next scheduled RN care management/care coordination outreach: 05/03/22 at 230

## 2022-04-04 ENCOUNTER — Other Ambulatory Visit: Payer: Self-pay

## 2022-04-04 NOTE — Patient Outreach (Signed)
Care Coordination  04/04/2022  Kelsey Bullock 04-13-1961 [id number]    Medicaid Managed Care   Unsuccessful Outreach Note  04/04/2022 Name: Kelsey Bullock MRN: [id number] DOB: August 11, 1960  Referred by: [name], MD Reason for referral : High Risk Managed Medicaid (MM Social Work [employer] )   An unsuccessful telephone outreach was attempted today. The patient was referred to the case management team for assistance with care management and care coordination.   Follow Up Plan: The care management team will reach out to the patient again over the next 7 days.   Mickel Fuchs, BSW, [place] Managed Medicaid Team  [phone]

## 2022-04-04 NOTE — Patient Instructions (Signed)
Visit Information  [name]  - as a part of your Medicaid benefit, you are eligible for care management and care coordination services at no cost or copay. I was unable to reach you by phone today but would be happy to help you with your health related needs. Please feel free to call me @ [phone].   A member of the Managed Medicaid care management team will reach out to you again over the next 7 days.   Mickel Fuchs, BSW, [place] Managed Medicaid Team  [phone]

## 2022-04-12 ENCOUNTER — Telehealth: Payer: Self-pay

## 2022-04-12 ENCOUNTER — Other Ambulatory Visit: Payer: Medicaid Other | Admitting: Pharmacist

## 2022-04-12 NOTE — Progress Notes (Signed)
04/12/2022 Name: Kelsey Bullock MRN: [id number] DOB: [birth year]  Chief Complaint  Patient presents with   Medication Management    Kelsey Bullock is a 61 y.o. year old female who presented for a telephone visit.   They were referred to the pharmacist by their Case Management Team  for assistance in managing medication access.   Patient is participating in a Managed Medicaid Plan:  Yes  Subjective:  Care Team: Primary Care Provider: [name], MD ; Next Scheduled Visit: 04/20/22 Allergy: Neldon Mc; Next Scheduled Visit: 10/03/22  Medication Access/Adherence  Current Pharmacy:  [facility] [id number] Phone: [phone] Fax: [place], [place] - [address] [place] [address] Phone: [phone] Fax: [phone]   Patient reports affordability concerns with their medications: Yes  Patient reports access/transportation concerns to their pharmacy: No  Patient reports adherence concerns with their medications:  Yes    Said she spoke with Legal [employer]. Struggling with fighting with being charged for housing. Gave phone number for [employer].   Medication Access/Adherence: - Notes she is struggling financially, though confirms she has medications at home. Fill history indicates she has filled several medications lately. We discussed that we could find a pharmacy that would agree to place her copays on a charge account, but she declines to do so at this time.   Asthma:  Current medications: Xolair 300 mg Q28 days; Dulera 200/5 mcg 2 puffs twice daily- not taking right now, reports she does not need; montelukast 10 mg QPM; cetirizine 10 mg daily PRN, fluticasone nasal spray as needed, ipratropium nasal spray as needed  Reports her allergies have been much better controlled since being  on Xolair. Does note more allergic symptoms during the fall. Reports she feels most benefit from montelukast a secondary agent. Denies need for rescue therapy lately. Hasn't needed prednisone for an exacerbation in over a year  Anxiety:  Current medications: clonazepam 0.5 mg QPM PRN - reports more frequent use of this lately; reports she is often not sleeping through the night and wakes up with nightmares.   RN CM has asked that the patient be connected with LCSW.   Upcoming appointment with PCP.   Acid Reflux:   Current medications: famotidine 20 mg daily - reports she has been taking this as prescribed by Dr. Neldon Mc last week  Knee Pain, Trigger Finger:  Current medications: cyclobenzaprine 10 mg PRN - has been using daily recently.   Reports issues with joints "locking up" and nerve pain, reports her sister thinks she has neuropathy. Denies benefit with Voltaren gel. Endorses benefit from cyclobenzaprine  Hyperlipidemia/ASCVD Risk Reduction  Current lipid lowering medications: atorvastatin 40 mg daily (noted from fill history)   Health Maintenance  Health Maintenance Due  Topic Date Due   Zoster Vaccines- Shingrix (1 of 2) Never done   COVID-19 Vaccine (5 - Pfizer risk series) 09/12/2021     Objective: Lab Results  Component Value Date   HGBA1C 5.5 10/17/2021    Lab Results  Component Value Date   CREATININE 0.69 10/19/2021   BUN 9 10/19/2021   NA 139 10/19/2021   K 4.4 10/19/2021   CL 100 10/19/2021   CO2 23 10/19/2021    Lab Results  Component Value Date   CHOL 108 01/28/2020   HDL  35 (L) 01/28/2020   LDLCALC 53 01/28/2020   LDLDIRECT 77 04/07/2020   TRIG 106 01/28/2020   CHOLHDL 3.1 01/28/2020    Medications Reviewed Today     Reviewed by Osker Mason, RPH-CPP (Pharmacist) on 04/12/22 at 1408  Med List Status: <None>   Medication Order Taking? Sig Documenting Provider Last Dose Status Informant  albuterol (PROVENTIL) (2.5 MG/3ML) 0.083%  nebulizer solution [id number]  Take 3 mLs (2.5 mg total) by nebulization every 4 (four) hours as needed. USE 1 VIAL VIA NEBULIZER EVERY 4 TO 6 HOURS AS NEEDED FOR COUGH OR WHEEZING Kozlow, Donnamarie Poag, MD  Active   albuterol (VENTOLIN HFA) 108 (90 Base) MCG/ACT inhaler [id number]  Inhale 2 puffs into the lungs every 4 (four) hours as needed. Kozlow, Donnamarie Poag, MD  Active   cetirizine (ZYRTEC) 10 MG tablet [id number] Yes Take 1 tablet (10 mg total) by mouth 2 (two) times daily as needed for allergies (Can take an extra dose for breakthrough symptoms). Jiles Prows, MD Taking Active            Med Note Jodi Mourning, [place] Apr 12, 2022  1:58 PM) Using PRN  clonazePAM (KLONOPIN) 0.5 MG tablet [id number] Yes TAKE 1 TABLET(0.5 MG) BY MOUTH AT BEDTIME [name], MD Taking Active Self           Med Note Jodi Mourning, [place] Apr 12, 2022  1:59 PM) Using PRN   cyclobenzaprine (FLEXERIL) 10 MG tablet [id number] Yes Take 1 tablet (10 mg total) by mouth 3 (three) times daily as needed for muscle spasms. [name], MD Taking Active   diclofenac Sodium (VOLTAREN) 1 % GEL [id number]  Apply 4 g topically 4 (four) times daily. [name], MD  Active   EPIPEN 2-PAK 0.3 MG/0.3ML SOAJ injection [id number] Yes Inject 0.3 mg into the muscle as needed for anaphylaxis. Kozlow, Donnamarie Poag, MD Taking Active   famotidine (PEPCID) 20 MG tablet [id number] Yes Take 1 tablet (20 mg total) by mouth daily. Kozlow, Donnamarie Poag, MD Taking Active   fluticasone [facility]) 50 MCG/ACT nasal spray [id number] Yes Place 1 spray into both nostrils daily as needed for allergies or rhinitis. Jiles Prows, MD Taking Active            Med Note Jodi Mourning, [place] Apr 12, 2022  1:56 PM) Using PRN  ipratropium (ATROVENT) 0.06 % nasal spray [id number] Yes USE 2 SPRAYS INTO EACH NOSTRIL EVERY 6 HOURS AS NEEDED FOR TO DRY UP NOSE. Jiles Prows, MD Taking Active            Med Note Jodi Mourning, [place] Apr 12, 2022  1:56 PM) Using PRN   linaclotide Rolan Lipa) 72 MCG capsule [id number] Yes Take 144 mcg by mouth daily before breakfast. [provider] Taking Active Self           Med Note Jodi Mourning, Jayjay Littles T   Wed Apr 12, 2022  2:02 PM) Using PRN  mometasone-formoterol (DULERA) 200-5 MCG/ACT AERO [id number] No Inhale 2 puffs into the lungs 2 (two) times daily as needed.  Patient not taking: Reported on 04/03/2022   Jiles Prows, MD Not Taking Active   montelukast (SINGULAIR) 10 MG tablet [id number] Yes Take one tablet by mouth at bedtime Kozlow, Donnamarie Poag, MD Taking Active   mupirocin ointment (BACTROBAN) 2 % [id number]  Apply 1 Application topically 2 (two) times daily.  Patient not taking:  Reported on 04/03/2022   Veleta Miners  Active   nystatin powder [id number]  APPLY TOPICALLY TO THE AFFECTED AREA THREE TIMES DAILY  Patient not taking: Reported on 04/03/2022   [name], MD  Active   omalizumab [name]) 150 MG/ML prefilled syringe [id number] Yes Inject 300 mg into the skin every 28 (twenty-eight) days. Kozlow, Donnamarie Poag, MD Taking Active   omalizumab [name]) injection 300 mg [id number]   Jiles Prows, MD  Active    Patient not taking:   Discontinued 04/12/22 1407 (Completed Course) Probiotic Product (PROBIOTIC DAILY PO) [id number] Yes Take 1 capsule by mouth daily. [provider] Taking Active Self    Discontinued 04/12/22 1407 (Patient Preference) triamcinolone (KENALOG) 0.025 % ointment [id number]  Apply topically 2 (two) times daily.  Patient not taking: Reported on 04/03/2022   [name], MD  Active     Discontinued 04/12/22 1407 (Patient Preference)             Assessment/Plan:   Medication Access/Adherence: Liana Gerold to collaborate with medical team to transfer prescriptions to a [facility] pharmacy and set up a charge account. Patient declines at this time, is most worried about housing  Asthma: - Relatively well controlled per patient report - Encouraged to continue to  prioritize Xolair injections. Discussed continuing montelukast for prevention and using Dulera more regularly if noticing more asthma symptoms in the fall. Discussed use of nasal sprays, though patient reports she hates using them.  - Encouraged to continue current regimen but add back some of her PRN medications as regular medications if worsening of symptoms. Continue collaboration with Dr. Neldon Mc.   Anxiety: - Uncontrolled per patient report  - Encouraged to discuss with PCP at visit next week. Will also connect with social work resources on the team for mental health and community resource support.   Acid Reflux:  - Improved with treatment per patient report - Recommended to continue current regimen at this time  Knee Pain, Trigger Finger: - Uncontrolled per patient report - Encouraged to discuss with PCP at visit next week.   ASCVD Risk Reduction: - Controlled per last lipid panel - Recommended to continue current regimen at this time. Recommended updated lipid panel with next step of labs.   Follow Up Plan: will outreach patient in 2-3 weeks  Catie TJodi Mourning, PharmD, [facility] [phone]

## 2022-04-12 NOTE — Progress Notes (Signed)
   Care Guide Note  04/12/2022 Name: [name] MRN: [id number] DOB: [birth year]  Referred by: [name], MD Reason for referral : No chief complaint on file.   Kelsey Bullock is a 60 y.o. year old female who is a primary care patient of Hensel, [name], MD. [name] was referred to the pharmacist for assistance related to DM.    An unsuccessful telephone outreach was attempted today to contact the patient who was referred to the pharmacy team for assistance with medication management. Additional attempts will be made to contact the patient.   Noreene Larsson, [place], [place] [id number] Direct Dial: [phone] [email or web address]

## 2022-04-13 ENCOUNTER — Telehealth: Payer: Self-pay

## 2022-04-13 NOTE — Patient Instructions (Signed)
Visit Information  Ms. [name]  - as a part of your Medicaid benefit, you are eligible for care management and care coordination services at no cost or copay. I was unable to reach you by phone today but would be happy to help you with your health related needs. Please feel free to call me @ [phone].   A member of the Managed Medicaid care management team will reach out to you again over the next 7 days.   Mickel Fuchs, BSW, [place] Managed Medicaid Team  [phone]

## 2022-04-13 NOTE — Patient Outreach (Signed)
  Medicaid Managed Care   Unsuccessful Outreach Note  04/13/2022 Name: Kelsey Bullock MRN: [id number] DOB: 11-Feb-1961  Referred by: [name], MD Reason for referral : High Risk Managed Medicaid (MM Social work telephone outreach)   A second unsuccessful telephone outreach was attempted today. The patient was referred to the case management team for assistance with care management and care coordination.    Follow Up Plan: The care management team will reach out to the patient again over the next 7 days.   Mickel Fuchs, BSW, [place] Managed Medicaid Team  [phone]

## 2022-04-20 ENCOUNTER — Ambulatory Visit (INDEPENDENT_AMBULATORY_CARE_PROVIDER_SITE_OTHER): Payer: Medicaid Other | Admitting: Family Medicine

## 2022-04-20 ENCOUNTER — Encounter: Payer: Self-pay | Admitting: Family Medicine

## 2022-04-20 VITALS — BP 116/82 | HR 61 | Wt 264.8 lb

## 2022-04-20 DIAGNOSIS — G8929 Other chronic pain: Secondary | ICD-10-CM

## 2022-04-20 DIAGNOSIS — L219 Seborrheic dermatitis, unspecified: Secondary | ICD-10-CM | POA: Diagnosis not present

## 2022-04-20 DIAGNOSIS — L739 Follicular disorder, unspecified: Secondary | ICD-10-CM

## 2022-04-20 DIAGNOSIS — E7800 Pure hypercholesterolemia, unspecified: Secondary | ICD-10-CM | POA: Diagnosis not present

## 2022-04-20 DIAGNOSIS — M545 Low back pain, unspecified: Secondary | ICD-10-CM

## 2022-04-20 MED ORDER — LINACLOTIDE 72 MCG PO CAPS
144.0000 ug | ORAL_CAPSULE | Freq: Every day | ORAL | 6 refills | Status: AC
Start: 2022-04-20 — End: ?

## 2022-04-20 MED ORDER — CYCLOBENZAPRINE HCL 10 MG PO TABS: 10.0000 mg | ORAL_TABLET | Freq: Three times a day (TID) | ORAL | 3 refills | Status: DC | PRN

## 2022-04-20 MED ORDER — FAMOTIDINE 20 MG PO TABS
20.0000 mg | ORAL_TABLET | Freq: Every day | ORAL | 3 refills | Status: DC
Start: 2022-04-20 — End: 2022-10-03

## 2022-04-20 MED ORDER — MUPIROCIN 2 % EX OINT: 1.0000 | TOPICAL_OINTMENT | Freq: Two times a day (BID) | CUTANEOUS | 0 refills | Status: DC

## 2022-04-20 MED ORDER — TRIAMCINOLONE ACETONIDE 0.025 % EX OINT: TOPICAL_OINTMENT | Freq: Two times a day (BID) | CUTANEOUS | 5 refills | Status: DC

## 2022-04-20 NOTE — Patient Instructions (Signed)
I will call with blood test results.  Based on those results, we will likely restart your cholesterol medicine. Two creams Triamcinolone is for the face rash Murpirocin is antibiotic for breast and armpit.   Please get your shingles vaccine at the pharmacy.  Let us know sho that we can update your records.

## 2022-04-20 NOTE — Assessment & Plan Note (Addendum)
Pt reports areas of discoloration, tenderness under bilateral breasts, R>L, and in R axillary region; she reports occasional bumps in these regions that exude pus. Differential includes folliculitis, intertrigo, eczema. Folliculitis is most likely given location of rash in axillary and areolar region along with intermittent production of pus. Less likely are intertrigo and eczema given location and pus production. - Will prescribe Bactroban to treat symptoms

## 2022-04-20 NOTE — Progress Notes (Signed)
    SUBJECTIVE:   CHIEF COMPLAINT / HPI:   Kelsey Bullock is a 61 y.o. female who presents today for follow-up. She is accompanied today by her son, who is also here for a visit. Concerns today include rash in breast and axillary region and med review.  Skin Rash She reports tenderness, discoloration and lumps under bilateral breast, right worse than left, which occasionally bleed or drain pus. She also reports a spot under right arm that swells up and exudes pus every so often.  No fever, dizziness. Occasional HA, stable at baseline. Occasional sharp chest pains, none with exertion. Stable OA pains in knees and back. Stable asthma symptoms on current medication regimen, no increased SOB or cough. Occasional constipation, occ diarrhea.  Med Review Taking cholesterol med prn, haven't taken in a year. Out of cyclobenzaprine, famotidine, linzess. Taking clonazepam, montelukast prn.  Having issues paying hospital bills. Got flu shot for 2023. Doesn't want COVID booster at this visit. Considering Shingrix vaccine.  Walking some around the house and around stores, no purposeful exercise. Trying to eat healthy, trying to avoid fried foods, bread, and sugar, though eating some. Eats a lot of fruit. No alcohol use regularly. No tobacco use, no recreational drug use.   PERTINENT  PMH / PSH: asthma, GERD, CHF, HTN, HLD, OSA (not on CPAP)  OBJECTIVE:   BP 116/82   Pulse 61   Wt 264 lb 12.8 oz (120.1 kg)   LMP 02/03/2019 Comment: Tubal Ligation  SpO2 98%   BMI 45.45 kg/m   General: Pt well appearing and seated comfortably in chair. NAD. Cardiovascular: RRR, no murmurs, rubs, gallops. Pulmonary: Normal work of breathing. Lungs clear to auscultation bilaterally. Skin: Erythematous patch under right nipple with some ulceration. Dark macule in right axillary region. Neuro/Psych: A&Ox3. Normal affect.  ASSESSMENT/PLAN:   HYPERCHOLESTEROLEMIA Pt reports she has not taken her cholesterol  medication in around a year. - Will repeat lipids today - Pending results, will restart pt on atorvastatin  Folliculitis Pt reports areas of discoloration, tenderness under bilateral breasts, R>L, and in R axillary region; she reports occasional bumps in these regions that exude pus. Differential includes folliculitis, intertrigo, eczema. Folliculitis is most likely given location of rash in axillary and areolar region along with intermittent production of pus. Less likely are intertrigo and eczema given location and pus production. - Will prescribe Bactroban to treat symptoms  Refilled Flexeril, famotidine, Linzess at pt's request. Triamcinolone refilled for ongoing rash at pt's hairline.  Carmelina Dane, [place]

## 2022-04-20 NOTE — Assessment & Plan Note (Addendum)
Pt reports she has not taken her cholesterol medication in around a year. - Will repeat lipids today - Pending results, will restart pt on atorvastatin Based on lab and VS ASCVD risk is only 5.2%.  Because of her history of DM, I still recommend statin.

## 2022-04-20 NOTE — Progress Notes (Signed)
Patient is first visit in a while.  She is doing well.  She had quit many meds on her own.  Issues Boil on right breast and right axilla High cholesterol - off meds for months.  Primary prevention.  Will repeat lipids before refilling atorvastatin. Seborrhea of scalp, needs refill on low potency triamcinolone. Other med refills.  Asks for refill on famotidine and cyclobenzaprine. Flu shot today.  Urged to also get shingles vaccine.

## 2022-04-21 ENCOUNTER — Telehealth: Payer: Self-pay | Admitting: Family Medicine

## 2022-04-21 DIAGNOSIS — E7800 Pure hypercholesterolemia, unspecified: Secondary | ICD-10-CM

## 2022-04-21 LAB — CMP14+EGFR
ALT: 9 IU/L (ref 0–32)
AST: 15 IU/L (ref 0–40)
Albumin/Globulin Ratio: 1.8 (ref 1.2–2.2)
Albumin: 4.5 g/dL (ref 3.8–4.9)
Alkaline Phosphatase: 103 IU/L (ref 44–121)
BUN/Creatinine Ratio: 16 (ref 12–28)
BUN: 11 mg/dL (ref 8–27)
Bilirubin Total: 0.5 mg/dL (ref 0.0–1.2)
CO2: 23 mmol/L (ref 20–29)
Calcium: 9.3 mg/dL (ref 8.7–10.3)
Chloride: 102 mmol/L (ref 96–106)
Creatinine, Ser: 0.67 mg/dL (ref 0.57–1.00)
Globulin, Total: 2.5 g/dL (ref 1.5–4.5)
Glucose: 104 mg/dL — ABNORMAL HIGH (ref 70–99)
Potassium: 4.5 mmol/L (ref 3.5–5.2)
Sodium: 140 mmol/L (ref 134–144)
Total Protein: 7 g/dL (ref 6.0–8.5)
eGFR: 100 mL/min/{1.73_m2} (ref >59)

## 2022-04-21 LAB — LIPID PANEL
Chol/HDL Ratio: 3.6 ratio (ref 0.0–4.4)
Cholesterol, Total: 216 mg/dL — ABNORMAL HIGH (ref 100–199)
HDL: 60 mg/dL (ref >39)
LDL Chol Calc (NIH): 135 mg/dL — ABNORMAL HIGH (ref 0–99)
Triglycerides: 119 mg/dL (ref 0–149)
VLDL Cholesterol Cal: 21 mg/dL (ref 5–40)

## 2022-04-21 MED ORDER — ATORVASTATIN CALCIUM 20 MG PO TABS: 20.0000 mg | ORAL_TABLET | Freq: Every day | ORAL | 3 refills | Status: DC

## 2022-04-21 NOTE — Telephone Encounter (Signed)
Patient returns call to nurse line.   She reports she was taking Atorvastatin '20mg'$  (cutting '40mg'$  in half) over one year ago. She reports she stopped taking this and has "plenty" left over. Patient advised to check expiration date. Medication has expired. Patient advised to discard.   Patient asks if she only needs '20mg'$  to send in '20mg'$  tablets.  Will forward to PCP.

## 2022-04-21 NOTE — Telephone Encounter (Signed)
Called patient and LM.  Yes, I want her back on the atorvastatin.  She should call back to verify the pill strength (I believe 40 mg) and whether she had been taking a full or half tab (I believe she said she was only taking a half tab.) She will also let me know if she needs a new Rx.

## 2022-04-21 NOTE — Telephone Encounter (Signed)
Called.  Yes, '20mg'$  dose.  Rx sent and med list updated.

## 2022-04-25 ENCOUNTER — Ambulatory Visit (INDEPENDENT_AMBULATORY_CARE_PROVIDER_SITE_OTHER): Payer: Medicaid Other

## 2022-04-25 DIAGNOSIS — J4540 Moderate persistent asthma, uncomplicated: Secondary | ICD-10-CM

## 2022-05-03 ENCOUNTER — Other Ambulatory Visit: Payer: Medicaid Other | Admitting: Obstetrics and Gynecology

## 2022-05-03 NOTE — Patient Outreach (Signed)
Care Coordination  05/03/2022  Kelsey Bullock 01-15-1961 [id number]   Medicaid Managed Care   Unsuccessful Outreach Note  05/03/2022 Name: Kelsey Bullock MRN: [id number] DOB: [birth year]  Referred by: [name], MD Reason for referral : High Risk Managed Medicaid (Unsuccessful telephone outreach)  An unsuccessful telephone outreach was attempted today. The patient was referred to the case management team for assistance with care management and care coordination.   Follow Up Plan: The care management team will reach out to the patient again over the next 30 business  days.   SIGNATURE Aida Raider RN, BSN [place]  [facility] - Managed [place] High Risk [phone]

## 2022-05-03 NOTE — Patient Instructions (Signed)
Visit Information  Ms. [name]  - as a part of your Medicaid benefit, you are eligible for care management and care coordination services at no cost or copay. I was unable to reach you by phone today but would be happy to help you with your health related needs. Please feel free to call me at [phone]  A member of the Managed Medicaid care management team will reach out to you again over the next 30 business days.   Aida Raider RN, BSN [place]  [facility] - Managed Medicaid High Risk [phone].

## 2022-05-25 ENCOUNTER — Other Ambulatory Visit: Payer: Self-pay | Admitting: Family Medicine

## 2022-05-25 ENCOUNTER — Ambulatory Visit (INDEPENDENT_AMBULATORY_CARE_PROVIDER_SITE_OTHER): Payer: Medicaid Other

## 2022-05-25 DIAGNOSIS — J4540 Moderate persistent asthma, uncomplicated: Secondary | ICD-10-CM

## 2022-05-25 DIAGNOSIS — L219 Seborrheic dermatitis, unspecified: Secondary | ICD-10-CM

## 2022-05-25 DIAGNOSIS — L304 Erythema intertrigo: Secondary | ICD-10-CM

## 2022-05-25 MED ORDER — KETOCONAZOLE 2 % EX SHAM: 1.0000 | MEDICATED_SHAMPOO | CUTANEOUS | 6 refills | Status: DC

## 2022-05-25 MED ORDER — TRIAMCINOLONE ACETONIDE 0.025 % EX OINT: TOPICAL_OINTMENT | Freq: Two times a day (BID) | CUTANEOUS | 5 refills | Status: DC

## 2022-05-25 MED ORDER — NYSTATIN 100000 UNIT/GM EX POWD: CUTANEOUS | 3 refills | Status: DC

## 2022-06-07 ENCOUNTER — Other Ambulatory Visit: Payer: Medicaid Other | Admitting: Obstetrics and Gynecology

## 2022-06-07 ENCOUNTER — Encounter: Payer: Self-pay | Admitting: Obstetrics and Gynecology

## 2022-06-07 DIAGNOSIS — Z79899 Other long term (current) drug therapy: Secondary | ICD-10-CM | POA: Diagnosis not present

## 2022-06-07 DIAGNOSIS — F32A Depression, unspecified: Secondary | ICD-10-CM | POA: Diagnosis not present

## 2022-06-07 DIAGNOSIS — F411 Generalized anxiety disorder: Secondary | ICD-10-CM | POA: Diagnosis not present

## 2022-06-07 NOTE — Patient Outreach (Signed)
Care Coordination  06/07/2022  Kelsey Bullock 23-Dec-1960 [id number]  RNCM returned patient's  phone call.  Patient stated appointment went well with Dr. Erling Cruz this morning and she remains on Klonopin which is helpful.  Patient states she has all of her medications, but does not take all of the time-prefers "natural".  RNCM will follow up with patient as scheduled.    Aida Raider RN, BSN [place]  [facility] - Managed Medicaid High Risk [phone].

## 2022-06-07 NOTE — Patient Outreach (Signed)
Care Coordination  06/07/2022  AKSHAYA TOEPFER 1960-08-10 [id number]  RNCM called patient at scheduled time.  Patient answered and stated she was waiting on a phone call from Dr. Erling Cruz in [facility] for a phone appointment.  RNCM rescheduled appointment to another day/time at patient request.  Aida Raider RN, [place] Management Coordinator - Managed [facility] High Risk [phone].

## 2022-06-09 ENCOUNTER — Other Ambulatory Visit: Payer: Self-pay

## 2022-06-09 ENCOUNTER — Ambulatory Visit (INDEPENDENT_AMBULATORY_CARE_PROVIDER_SITE_OTHER): Payer: Medicaid Other | Admitting: Internal Medicine

## 2022-06-09 VITALS — BP 146/80 | HR 77 | Temp 99.4°F | Resp 20 | Ht 64.5 in | Wt 280.7 lb

## 2022-06-09 DIAGNOSIS — J3089 Other allergic rhinitis: Secondary | ICD-10-CM | POA: Diagnosis not present

## 2022-06-09 DIAGNOSIS — J4540 Moderate persistent asthma, uncomplicated: Secondary | ICD-10-CM | POA: Diagnosis not present

## 2022-06-09 DIAGNOSIS — J069 Acute upper respiratory infection, unspecified: Secondary | ICD-10-CM | POA: Diagnosis not present

## 2022-06-09 DIAGNOSIS — J45901 Unspecified asthma with (acute) exacerbation: Secondary | ICD-10-CM

## 2022-06-09 DIAGNOSIS — U071 COVID-19: Secondary | ICD-10-CM | POA: Diagnosis not present

## 2022-06-09 MED ORDER — NIRMATRELVIR/RITONAVIR (PAXLOVID)TABLET: 3.0000 | ORAL_TABLET | Freq: Two times a day (BID) | ORAL | 0 refills | Status: AC

## 2022-06-09 MED ORDER — DULERA 200-5 MCG/ACT IN AERO: INHALATION_SPRAY | RESPIRATORY_TRACT | 5 refills | Status: DC

## 2022-06-09 MED ORDER — ALBUTEROL SULFATE HFA 108 (90 BASE) MCG/ACT IN AERS: 2.0000 | INHALATION_SPRAY | RESPIRATORY_TRACT | 1 refills | Status: DC | PRN

## 2022-06-09 MED ORDER — ALBUTEROL SULFATE (2.5 MG/3ML) 0.083% IN NEBU: 2.5000 mg | INHALATION_SOLUTION | RESPIRATORY_TRACT | 1 refills | Status: DC | PRN

## 2022-06-09 NOTE — Progress Notes (Addendum)
FOLLOW UP Date of Service/Encounter:  06/09/22   Subjective:  Kelsey Bullock (DOB: [birth year]) is a 62 y.o. female who returns to the [facility] [place] on 06/09/2022 for follow up for an acute visit.   History obtained from: chart review and patient. Last seen Dr. Neldon Mc 03/28/2022 for asthma, allergic rhinitis and LPRD. Well controlled on Xolair. Also on Singulair, Flonase, PRN famotidine, PRN anti histamine, PRN Dulera.   Reports her kids came in Tuesday and had COVID a week prior and were still having congestion/cough.  Then 2 days ago, she developed runny nose, congestion, coughing.  She has so much congestion and drainage that she can't breathe from her nose.  She also has chills, no fever. Used albuterol x1 for the coughing.  She has not started the [facility] that Dr. Neldon Mc had called in last visit for PRN use. She did restart her Flonase and Ipratroprium recently. She is also taking Zyrtec '10mg'$  and Singulair '10mg'$  PRN.  Past Medical History: Past Medical History:  Diagnosis Date   Allergic rhinitis    Anemia    Anxiety    Arthritis    knees and back   Asthma    Complication of anesthesia    Congestive heart failure (CHF) (HCC)    Depression    Gall stone    GERD (gastroesophageal reflux disease)    Headache    migraines, stress headaches   History of kidney stones    Hypertension    Hyperthyroidism    during pregnancy   Pneumonia    PONV (postoperative nausea and vomiting)    Recurrent upper respiratory infection (URI)    Sleep apnea    can't use Cpap    Objective:  BP (!) 146/80   Pulse 77   Temp 99.4 F (37.4 C) (Temporal)   Resp 20   Ht 5' 4.5" (1.638 m)   Wt 280 lb 11.2 oz (127.3 kg)   LMP 02/03/2019 Comment: Tubal Ligation  SpO2 98%   BMI 47.44 kg/m  Body mass index is 47.44 kg/m. Physical Exam: GEN: alert, well developed HEENT: clear conjunctiva, nose with lots of clear rhinorrhea. HEART: regular rate and rhythm, no murmur LUNGS: clear  to auscultation bilaterally, no coughing, unlabored respiration SKIN: no rashes or lesions   Assessment/Plan  COVID - Start Paxlovid for 5 days. - Given instructions of symptomatic care for viral infections.   Moderate Persistent Asthma - Has coughing but no wheezing that is likely due to the viral infection.  We will get ahead of this with starting her Dulera. No need for oral prednisone as this is likely not an asthma exacerbation.  - MDI technique discussed.   - Maintenance inhaler: continue Singulair '10mg'$  daily and Xolair every 4 weeks.  - For respiratory illness or with asthma flare up such as now, start Dulera 210mg 2 puffs twice daily for 1-2 weeks and then stop.   - Rescue inhaler: Albuterol 2 puffs via spacer or 1 vial via nebulizer every 4-6 hours as needed for respiratory symptoms of cough, shortness of breath, or wheezing Asthma control goals:  Full participation in all desired activities (may need albuterol before activity) Albuterol use two times or less a week on average (not counting use with activity) Cough interfering with sleep two times or less a month Oral steroids no more than once a year No hospitalizations   Chronic Rhinitis: - Use nasal saline rinses before nose sprays such as with Neilmed Sinus Rinse.  Use distilled  water.   - Use Flonase 2 sprays each nostril daily. Aim upward and outward. - Use Ipratroprium 1-2 sprays up to four times daily as needed for runny nose. Aim upward and outward. - Use Zyrtec 10 mg daily.  - Use Singulair '10mg'$  daily. Stop if there are any mood/behavioral changes.   Keep routine follow up with Dr. Neldon Mc.  Harlon Flor, MD  [facility] [place] [facility] [place]

## 2022-06-09 NOTE — Patient Instructions (Addendum)
Keep routine follow up with Dr. Neldon Mc.   COVID - Start Paxlovid for 5 days.  Asthma - MDI technique discussed.   - Maintenance inhaler: continue Singulair '10mg'$  daily and Xolair every 4 weeks.  - For respiratory illness or with asthma flare up such as now, start Dulera 212mg 2 puffs twice daily for 1-2 weeks and then stop.  - Rescue inhaler: Albuterol 2 puffs via spacer or 1 vial via nebulizer every 4-6 hours as needed for respiratory symptoms of cough, shortness of breath, or wheezing Asthma control goals:  Full participation in all desired activities (may need albuterol before activity) Albuterol use two times or less a week on average (not counting use with activity) Cough interfering with sleep two times or less a month Oral steroids no more than once a year No hospitalizations   Rhinitis: - Use nasal saline rinses before nose sprays such as with Neilmed Sinus Rinse.  Use distilled water.   - Use Flonase 2 sprays each nostril daily. Aim upward and outward. - Use Ipratroprium 1-2 sprays up to four times daily as needed for runny nose. Aim upward and outward. - Use Zyrtec 10 mg daily.  - Use Singulair '10mg'$  daily. Stop if there are any mood/behavioral changes.   Antibiotics only work on bacterial infections. Because URIs usually are caused by viruses, antibiotics are not an effective treatment.   Nasal congestion, post nasal dripping, or sinus pressure:   Nasal rinsing with saline nasal spray or salt water (e.g., Neilmed Sinus Rinse bottle) can help relieve nasal dryness.   Use a warm mist humidifier or take a steamy shower to increase moisture in the air and soothe oral and nasal tissues that become irritated with dry air.   Sore throat:  Use a pain reliever, such as acetaminophen (Tylenol) or ibuprofen (Advil).   Gargle with salt water (1/4 tsp of salt dissolved in 8 oz of warm water). Salt water can be used as often as you like.   Throat lozenges or throat sprays (Cepacol  lozenges or Chloroseptic spray) may bring some relief by increasing saliva production and lubricating your throat.   Drink plenty of fluids (e.g., water, diluted juice, tea) to soothe your throat and to stay well hydrated.   Coughing:   Medications that contain dextromethorphan (e.g., Robitussin DM, Mucinex DM, Delsym) may help to suppress a cough.   Tea with honey, when taken regularly, can soothe a sore throat and help suppress a cough. Take care with medications   Strengthen your immune system:   Make sure you eat well (e.g., a healthy, balanced variety of foods).   Avoid alcohol as it can directly suppress various immune responses.   Stay away from cigarettes, and second hand smoke.   Rest as much as possible and get plenty of sleep (at least 8 hours).

## 2022-06-22 ENCOUNTER — Ambulatory Visit (INDEPENDENT_AMBULATORY_CARE_PROVIDER_SITE_OTHER): Payer: Medicaid Other

## 2022-06-22 DIAGNOSIS — J4540 Moderate persistent asthma, uncomplicated: Secondary | ICD-10-CM | POA: Diagnosis not present

## 2022-07-04 ENCOUNTER — Other Ambulatory Visit: Payer: Medicaid Other | Admitting: Obstetrics and Gynecology

## 2022-07-04 NOTE — Patient Instructions (Signed)
Hi Ms. Docter, I am sorry to have missed you today-I hope you are doing okay - as a part of your Medicaid benefit, you are eligible for care management and care coordination services at no cost or copay. I was unable to reach you by phone today but would be happy to help you with your health related needs. Please feel free to call me at [phone].  A member of the Managed Medicaid care management team will reach out to you again over the next 30 business  days.   Aida Raider RN, BSN [place]  [facility] - Managed Medicaid High Risk [phone]

## 2022-07-04 NOTE — Patient Outreach (Signed)
  Medicaid Managed Care   Unsuccessful Attempt Note   07/04/2022 Name: Kelsey Bullock MRN: [id number] DOB: Dec 20, 1960  Referred by: [name], MD Reason for referral : High Risk Managed Medicaid (Unsuccessful telephone outreach)   An unsuccessful telephone outreach was attempted today. The patient was referred to the case management team for assistance with care management and care coordination.    Follow Up Plan: The Managed Medicaid care management team will reach out to the patient again over the next 30 business  days. and The  Patient has been provided with contact information for the Managed Medicaid care management team and has been advised to call with any health related questions or concerns.    Aida Raider RN, BSN [place]  [facility] - Managed Medicaid High Risk [phone]

## 2022-07-06 ENCOUNTER — Encounter: Payer: Self-pay | Admitting: Obstetrics and Gynecology

## 2022-07-06 ENCOUNTER — Other Ambulatory Visit: Payer: Medicaid Other | Admitting: Obstetrics and Gynecology

## 2022-07-06 NOTE — Patient Outreach (Signed)
Care Coordination  07/06/2022  TAYLOUR LIETZKE [birth year] [phone]  RNCM returned patient's phone call.  Patient with many stressors right now, emotional support provided.  Patient has follow up appointments scheduled.  RNCM will continue to follow.  Aida Raider RN, BSN [place]  [facility] - Managed Medicaid High Risk [phone].

## 2022-07-11 ENCOUNTER — Encounter: Payer: Self-pay | Admitting: Family Medicine

## 2022-07-20 ENCOUNTER — Ambulatory Visit (INDEPENDENT_AMBULATORY_CARE_PROVIDER_SITE_OTHER): Payer: Medicaid Other

## 2022-07-20 ENCOUNTER — Encounter: Payer: Self-pay | Admitting: Family Medicine

## 2022-07-20 DIAGNOSIS — J4540 Moderate persistent asthma, uncomplicated: Secondary | ICD-10-CM

## 2022-07-20 MED ORDER — IPRATROPIUM BROMIDE 0.06 % NA SOLN: 2.0000 | Freq: Four times a day (QID) | NASAL | 2 refills | Status: DC

## 2022-07-25 ENCOUNTER — Emergency Department (HOSPITAL_BASED_OUTPATIENT_CLINIC_OR_DEPARTMENT_OTHER): Payer: Medicaid Other

## 2022-07-25 ENCOUNTER — Emergency Department (HOSPITAL_BASED_OUTPATIENT_CLINIC_OR_DEPARTMENT_OTHER): Payer: Medicaid Other | Admitting: Radiology

## 2022-07-25 ENCOUNTER — Emergency Department (HOSPITAL_BASED_OUTPATIENT_CLINIC_OR_DEPARTMENT_OTHER)
Admission: EM | Admit: 2022-07-25 | Discharge: 2022-07-26 | Disposition: A | Discharge status: Home / Self Care | Payer: Medicaid Other | Attending: Emergency Medicine | Admitting: Emergency Medicine

## 2022-07-25 ENCOUNTER — Encounter (HOSPITAL_BASED_OUTPATIENT_CLINIC_OR_DEPARTMENT_OTHER): Payer: Self-pay | Admitting: Emergency Medicine

## 2022-07-25 ENCOUNTER — Other Ambulatory Visit: Payer: Self-pay

## 2022-07-25 DIAGNOSIS — W1830XA Fall on same level, unspecified, initial encounter: Secondary | ICD-10-CM | POA: Diagnosis not present

## 2022-07-25 DIAGNOSIS — R109 Unspecified abdominal pain: Secondary | ICD-10-CM | POA: Diagnosis not present

## 2022-07-25 DIAGNOSIS — R1032 Left lower quadrant pain: Secondary | ICD-10-CM | POA: Diagnosis present

## 2022-07-25 DIAGNOSIS — Z91040 Latex allergy status: Secondary | ICD-10-CM | POA: Diagnosis not present

## 2022-07-25 DIAGNOSIS — M25522 Pain in left elbow: Secondary | ICD-10-CM | POA: Diagnosis not present

## 2022-07-25 DIAGNOSIS — K5900 Constipation, unspecified: Secondary | ICD-10-CM | POA: Insufficient documentation

## 2022-07-25 DIAGNOSIS — Z043 Encounter for examination and observation following other accident: Secondary | ICD-10-CM | POA: Diagnosis not present

## 2022-07-25 DIAGNOSIS — M25562 Pain in left knee: Secondary | ICD-10-CM | POA: Diagnosis not present

## 2022-07-25 DIAGNOSIS — Z0389 Encounter for observation for other suspected diseases and conditions ruled out: Secondary | ICD-10-CM | POA: Diagnosis not present

## 2022-07-25 DIAGNOSIS — W19XXXA Unspecified fall, initial encounter: Secondary | ICD-10-CM

## 2022-07-25 DIAGNOSIS — K8020 Calculus of gallbladder without cholecystitis without obstruction: Secondary | ICD-10-CM | POA: Diagnosis not present

## 2022-07-25 NOTE — ED Notes (Signed)
Patient in CT

## 2022-07-25 NOTE — ED Triage Notes (Signed)
Pt c/o L knee pain and L elbow pain from mechanical fall. Pt states she was walking when she tripped over a rock causing her to land on her front side. Pt denies hitting her head and denies LOC. Last TDAP 02/2018

## 2022-07-26 MED ORDER — LIDOCAINE 5 % EX PTCH
2.0000 | MEDICATED_PATCH | CUTANEOUS | Status: DC
  Administered 2022-07-26: 2 via TRANSDERMAL
  Filled 2022-07-26: qty 2

## 2022-07-26 NOTE — ED Notes (Signed)
Reviewed AVS/discharge instruction with patient. Time allotted for and all questions answered. Patient is agreeable for d/c and escorted to ed exit by staff.  

## 2022-07-26 NOTE — ED Provider Notes (Signed)
[facility] Provider Note   CSN: [id number] Arrival date & time: 07/25/22  1912     History  Chief Complaint  Patient presents with   Kelsey Bullock    Kelsey Bullock is a 62 y.o. female.  The history is provided by the patient.  Fall This is a new problem. The current episode started 6 to 12 hours ago. The problem occurs constantly. The problem has been resolved. Pertinent negatives include no chest pain, no headaches and no shortness of breath. Nothing aggravates the symptoms. Nothing relieves the symptoms. She has tried nothing for the symptoms. The treatment provided no relief.  Patient fell into mulch that had rocks in it and hit L elbow and knee and since being here LLQ pain.       Home Medications Prior to Admission medications   Medication Sig Start Date End Date Taking? Authorizing Provider  albuterol (PROVENTIL) (2.5 MG/3ML) 0.083% nebulizer solution Take 3 mLs (2.5 mg total) by nebulization every 4 (four) hours as needed. USE 1 VIAL VIA NEBULIZER EVERY 4 TO 6 HOURS AS NEEDED FOR COUGH OR WHEEZING 06/09/22   Larose Kells, MD  albuterol (VENTOLIN HFA) 108 (90 Base) MCG/ACT inhaler Inhale 2 puffs into the lungs every 4 (four) hours as needed. 06/09/22   Larose Kells, MD  atorvastatin (LIPITOR) 20 MG tablet Take 1 tablet (20 mg total) by mouth daily. 04/21/22   [name], MD  cetirizine (ZYRTEC) 10 MG tablet Take 1 tablet (10 mg total) by mouth 2 (two) times daily as needed for allergies (Can take an extra dose for breakthrough symptoms). 03/28/22   Kozlow, Donnamarie Poag, MD  clonazePAM (KLONOPIN) 1 MG tablet Take 1 mg by mouth as needed. 06/07/22   [provider]  cyclobenzaprine (FLEXERIL) 10 MG tablet Take 1 tablet (10 mg total) by mouth 3 (three) times daily as needed for muscle spasms. 04/20/22   [name], MD  diclofenac Sodium (VOLTAREN) 1 % GEL Apply 4 g topically 4 (four) times daily. 08/15/21   [name], MD   EPIPEN 2-PAK 0.3 MG/0.3ML SOAJ injection Inject 0.3 mg into the muscle as needed for anaphylaxis. 03/28/22   Kozlow, Donnamarie Poag, MD  famotidine (PEPCID) 20 MG tablet Take 1 tablet (20 mg total) by mouth daily. 04/20/22   [name], MD  fluticasone (FLONASE) 50 MCG/ACT nasal spray Place 1 spray into both nostrils daily as needed for allergies or rhinitis. 03/28/22   Kozlow, Donnamarie Poag, MD  furosemide (LASIX) 10 MG/ML solution Take 10 mg by mouth as needed.    [provider]  ipratropium (ATROVENT) 0.06 % nasal spray Place 2 sprays into both nostrils 4 (four) times daily. USE 2 SPRAYS INTO EACH NOSTRIL EVERY 6 HOURS AS NEEDED FOR TO DRY UP NOSE. 07/20/22   Kozlow, Donnamarie Poag, MD  ketoconazole (NIZORAL) 2 % shampoo Apply 1 Application topically 2 (two) times a week. 05/25/22   [name], MD  linaclotide Rolan Lipa) 72 MCG capsule Take 2 capsules (144 mcg total) by mouth daily before breakfast. 04/20/22   Hensel, [name], MD  mometasone-formoterol [facility]) 200-5 MCG/ACT AERO Take twice daily for 1-2 weeks with respiratory illness or asthma flare. 06/09/22   Larose Kells, MD  montelukast (SINGULAIR) 10 MG tablet Take one tablet by mouth at bedtime Patient taking differently: as needed. Take one tablet by mouth at bedtime 03/28/22   Kozlow, Donnamarie Poag, MD  mupirocin ointment (BACTROBAN) 2 %  Apply 1 Application topically 2 (two) times daily. 04/20/22   [name], MD  nystatin powder APPLY TOPICALLY TO THE AFFECTED AREA THREE TIMES DAILY 05/25/22   [name], MD  omalizumab [name]) 150 MG/ML prefilled syringe Inject 300 mg into the skin every 28 (twenty-eight) days. 04/03/22   Kozlow, Donnamarie Poag, MD  Probiotic Product (PROBIOTIC DAILY PO) Take 1 capsule by mouth as needed.    [provider]  triamcinolone (KENALOG) 0.025 % ointment Apply topically 2 (two) times daily. 05/25/22   [name], MD      Allergies    Amoxicillin, Etodolac, Penicillins, Latex, Pantoprazole  sodium, Wound dressing adhesive, Ampicillin, Augmentin [amoxicillin-pot clavulanate], Diclofenac sodium, Pantoprazole sodium, and Adhesive [tape]    Review of Systems   Review of Systems  Constitutional:  Negative for fever.  HENT:  Negative for facial swelling.   Eyes:  Negative for redness.  Respiratory:  Negative for shortness of breath.   Cardiovascular:  Negative for chest pain.  Musculoskeletal:  Positive for arthralgias.  Neurological:  Negative for headaches.  All other systems reviewed and are negative.   Physical Exam Updated Vital Signs BP (!) 124/52   Pulse 62   Temp 99.2 F (37.3 C) (Oral)   Resp 20   Ht 5' 5"$  (1.651 m)   Wt 121.1 kg   LMP 02/03/2019 Comment: Tubal Ligation  SpO2 99%   BMI 44.43 kg/m  Physical Exam Vitals and nursing note reviewed.  Constitutional:      General: She is not in acute distress.    Appearance: Normal appearance. She is well-developed.  HENT:     Head: Normocephalic and atraumatic.     Nose: Nose normal.  Eyes:     Pupils: Pupils are equal, round, and reactive to light.  Cardiovascular:     Rate and Rhythm: Normal rate and regular rhythm.     Pulses: Normal pulses.     Heart sounds: Normal heart sounds.  Pulmonary:     Effort: Pulmonary effort is normal. No respiratory distress.     Breath sounds: Normal breath sounds.  Abdominal:     General: Bowel sounds are normal. There is no distension.     Palpations: Abdomen is soft.     Tenderness: There is no abdominal tenderness. There is no guarding or rebound.  Genitourinary:    Vagina: No vaginal discharge.  Musculoskeletal:        General: Normal range of motion.     Left elbow: No swelling, deformity or effusion. Normal range of motion.     Left forearm: Normal.     Left wrist: Normal. No bony tenderness or snuff box tenderness.     Left hand: Normal.     Cervical back: Normal range of motion and neck supple.     Left knee: No swelling, deformity, effusion, bony  tenderness or crepitus. No LCL laxity, MCL laxity, ACL laxity or PCL laxity.Normal meniscus and normal patellar mobility.     Instability Tests: Anterior drawer test negative. Posterior drawer test negative. Medial McMurray test negative and lateral McMurray test negative.     Left lower leg: Normal.     Right ankle:     Right Achilles Tendon: Normal.     Left ankle: Normal.     Left Achilles Tendon: Normal.     Right foot: Normal.     Left foot: Normal.  Skin:    General: Skin is dry.     Capillary Refill: Capillary  refill takes less than 2 seconds.     Findings: No erythema or rash.  Neurological:     General: No focal deficit present.     Mental Status: She is alert.     Deep Tendon Reflexes: Reflexes normal.  Psychiatric:        Mood and Affect: Mood normal.     ED Results / Procedures / Treatments   Labs (all labs ordered are listed, but only abnormal results are displayed) Labs Reviewed - No data to display  EKG None  Radiology CT Renal Stone Study  Result Date: 07/26/2022 CLINICAL DATA:  Abdominal and flank pain.  Stone suspected. EXAM: CT ABDOMEN AND PELVIS WITHOUT CONTRAST TECHNIQUE: Multidetector CT imaging of the abdomen and pelvis was performed following the standard protocol without IV contrast. RADIATION DOSE REDUCTION: This exam was performed according to the departmental dose-optimization program which includes automated exposure control, adjustment of the mA and/or kV according to patient size and/or use of iterative reconstruction technique. COMPARISON:  CTs with IV contrast 03/13/2019 and 02/02/2019 FINDINGS: Lower chest: The cardiac size is normal. There is calcification in the right coronary artery. The prior studies also showed scattered calcifications in the LAD and circumflex arteries. Lung bases show scattered linear scarring or atelectasis but no infiltrates. Hepatobiliary: The liver is 22 cm length with mild steatosis. There is homogeneous noncontrast  attenuation. There are few tiny stones in the posterior gallbladder. There is no wall thickening or biliary dilatation. Pancreas: Without contrast no focal abnormality is seen. Spleen: No abnormality is seen without contrast. Adrenals/Urinary Tract: There is no adrenal mass. Again noted is a 1.5 cm cyst in the superior pole of the right kidney, Hounsfield density of 0. No follow-up imaging recommended. The unenhanced renal cortex is otherwise homogeneous. No intrarenal or ureteral stones or hydroureteronephrosis are seen. The bladder is thickened but also is nondistended. Stomach/Bowel: The stomach is contracted. The unopacified small bowel unremarkable apart from a surgically widened segment in the right lower quadrant. The appendix is normal. There is mild-to-moderate fecal stasis. Scattered colonic diverticula without diverticulitis. Vascular/Lymphatic: Circumaortic left renal vein. Trace aortic atherosclerosis with iliac calcific plaques. No adenopathy is seen. Multiple pelvic phleboliths. Reproductive: The uterus is intact and anteverted with again noted small calcified fibroid in the ventral body of uterus. No adnexal mass is seen. Other: Postsurgical change in the midline infraumbilical anterior wall. Small umbilical fat hernia with no incarcerated hernia. The low anterior wall air and fluid collection previously noted extending to the umbilicus, is no longer seen. There is no free air, free fluid or free hemorrhage. Musculoskeletal: Mild osteopenia, degenerative change thoracic and lumbar spine. No acute or significant osseous findings IMPRESSION: 1. No evidence of urinary stones or hydroureteronephrosis, either currently or previously. 2. Cystitis versus bladder nondistention. 3. Constipation and diverticulosis. 4. Cholelithiasis without evidence of cholecystitis. 5. Mild hepatic steatosis and enlargement. 6. Aortic and coronary artery atherosclerosis. Circumaortic left renal vein. 7. Small umbilical fat  hernia with postsurgical change in the midline infraumbilical anterior wall. The low anterior wall air and fluid collection previously noted extending to the umbilicus is no longer seen. 8. Osteopenia and degenerative change. Electronically Signed   By: Telford Nab M.D.   On: 07/26/2022 00:22   DG Knee Complete 4 Views Left  Result Date: 07/25/2022 CLINICAL DATA:  Mechanical fall EXAM: LEFT KNEE - COMPLETE 4+ VIEW COMPARISON:  04/29/2021 FINDINGS: No fracture or malalignment. 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 No sizable knee effusion. IMPRESSION: No acute osseous abnormality. Electronically Signed   By: Donavan Foil M.D.   On: 07/25/2022 21:45   DG Elbow Complete Left  Result Date: 07/25/2022 CLINICAL DATA:  Elbow injury EXAM: LEFT ELBOW - COMPLETE 3+ VIEW COMPARISON:  None Available. FINDINGS: There is no evidence of fracture, dislocation, or joint effusion. There is no evidence of arthropathy or other focal bone abnormality. Soft tissues are unremarkable. IMPRESSION: Negative. Electronically Signed   By: Donavan Foil M.D.   On: 07/25/2022 21:44    Procedures Procedures    Medications Ordered in ED Medications  lidocaine (LIDODERM) 5 % 2 patch (has no administration in time range)    ED Course/ Medical Decision Making/ A&P                             Medical Decision Making Patient with a fall earlier with knee and elbow pain   Amount and/or Complexity of Data Reviewed Independent Historian: friend    Details: See above  External Data Reviewed: notes.    Details: Previous notes reviewed  Radiology: ordered and independent interpretation performed.    Details: Negative XR knee and elbow ct constipation by me   Risk Prescription drug management. Risk Details: No acute injury on images exam and vitals are benign and reassuring. Constipation on CT scan.  Start a bowel regimen at home.   Stable for  discharge with close follow up.  PRN care with orthopedics.  Stable for discharge.  Strict return     Final Clinical Impression(s) / ED Diagnoses Final diagnoses:  Fall, initial encounter  Constipation, unspecified constipation type  Acute pain of left knee  Left elbow pain   Return for intractable cough, coughing up blood, fevers > 100.4 unrelieved by medication, shortness of breath, intractable vomiting, chest pain, shortness of breath, weakness, numbness, changes in speech, facial asymmetry, abdominal pain, passing out, Inability to tolerate liquids or food, cough, altered mental status or any concerns. No signs of systemic illness or infection. The patient is nontoxic-appearing on exam and vital signs are within normal limits.  I have reviewed the triage vital signs and the nursing notes. Pertinent labs & imaging results that were available during my care of the patient were reviewed by me and considered in my medical decision making (see chart for details). After history, exam, and medical workup I feel the patient has been appropriately medically screened and is safe for discharge home. Pertinent diagnoses were discussed with the patient. Patient was given return precautions.      Rx / DC Orders ED Discharge Orders     None         Sariya Trickey, MD 07/26/22 [id number]

## 2022-08-01 ENCOUNTER — Encounter: Payer: Self-pay | Admitting: Obstetrics and Gynecology

## 2022-08-01 ENCOUNTER — Other Ambulatory Visit: Payer: Medicaid Other | Admitting: Obstetrics and Gynecology

## 2022-08-01 NOTE — Patient Instructions (Signed)
Visit Information  Kelsey Bullock was given information about Medicaid Managed Care team care coordination services as a part of their [place] benefit. Kelsey Bullock verbally consented to engagement with the [facility] Managed Care team.   If you are experiencing a medical emergency, please call 911 or report to your local emergency department or urgent care.   If you have a non-emergency medical problem during routine business hours, please contact your provider's office and ask to speak with a nurse.   For questions related to your [facility], please call: [phone] or visit the homepage here: [email or web address]  If you would like to schedule transportation through your [facility], please call the following number at least 2 days in advance of your appointment: [phone]   Rides for urgent appointments can also be made after hours by calling Member Services.  Call the Kelsey Bullock at [phone], at any time, 24 hours a day, 7 days a week. If you are in danger or need immediate medical attention call 911.  If you would like help to quit smoking, call 1-800-QUIT-NOW (609) 845-1568) OR Espaol: 1-855-Djelo-Ya HD:1601594) o para ms informacin haga clic aqu or Text READY to 200-400 to register via text  Kelsey Bullock - following are the goals we discussed in your visit today:   Goals Addressed    Timeframe:  Long-Range Goal Priority:  High Start Date:   02/27/22                          Expected End Date:ongoing                       Follow Up Date 08/30/22   - practice safe sex - schedule appointment for flu shot - schedule appointment for vaccines needed due to my age or health - schedule recommended health tests (blood work, mammogram, colonoscopy, pap test) - schedule and keep appointment for annual check-up     Why is this important?   Screening tests can find diseases early when they are easier to treat.  Your doctor or nurse will talk with you about which tests are important for you.  Getting shots for common diseases like the flu and shingles will help prevent them.   08/01/22:  Up to date on appts, immunotherapy appt in March, continues therapy appts.  Patient verbalizes understanding of instructions and care plan provided today and agrees to view in [place]. Active MyChart status and patient understanding of how to access instructions and care plan via MyChart confirmed with patient.     The Managed Medicaid care management team will reach out to the patient again over the next 30 business  days.  The  Patient  has been provided with contact information for the Managed Medicaid care management team and has been advised to call with any health related questions or concerns.   Aida Raider RN, BSN [place] Management Coordinator - Managed Medicaid High Risk [phone]   Following is a copy of your plan of care:  Care Plan : [place] of Care  Updates made by Gayla Medicus, RN since 08/01/2022 12:00 AM     Problem: Health Promotion or Disease Self-Management (General Plan of Care)      Long-Range Goal: Chronic Disease Management and Care Coordination Needs   Start Date: 02/27/2022  Expected End Date:  10/30/2022  Priority: High  Note:   Current Barriers:  Knowledge Deficits related to plan of care for management of CHF, GERD, asthma, osteoarthritis, constipation, sleep apnea Care Coordination needs related to Transportation and Housing barriers Chronic Disease Management support and education needs related to CHF, GERD, Asthma,osteoarthritis, constipation, sleep apnea. 08/01/22:  No complaints today other than soreness from fall at home-left knee and elbow-seen in ED 2/21-no injury.  Continues therapy-does not take meds as directed.  RNCM  Clinical Goal(s):  Patient will verbalize understanding of plan for management of CHF, GERD, asthma, osteoarthritis, constipation, sleep apnea as evidenced by patient report verbalize basic understanding of  CHF, GERD, asthma, osteoarthritis, constipation, sleep apnea disease process and self health management plan as evidenced by patient report take all medications exactly as prescribed and will call provider for medication related questions as evidenced by patient report demonstrate understanding of rationale for each prescribed medication as evidenced by patient report attend all scheduled medical appointments as evidenced by patient report continue to work with RN Care Manager to address care management and care coordination needs related to  CHF, GERD, asthma, osteoarthritis, constipation, sleep apnea as evidenced by adherence to CM Team Scheduled appointments through collaboration with RN Care manager, provider, and care team.   Interventions: Inter-disciplinary care team collaboration (see longitudinal plan of care) Evaluation of current treatment plan related to  self management and patient's adherence to plan as established by provider Pharmacy referral for medication review Collaborated with Pharmacy BSW for food and legal resources-completed Collaborated with BSW  Asthma: (Status:New goal.) Long Term Goal Provided patient with basic written and verbal Asthma education on self care/management/and exacerbation prevention Advised patient to track and manage Asthma triggers Provided education about and advised patient to utilize infection prevention strategies to reduce risk of respiratory infection Discussed the importance of adequate rest and management of fatigue with Asthma Assessed social determinant of health barriers  04/03/22:  patient currently not taking medications-has medications to use as needed.  CAD Interventions: (Status:  New goal.) Long Term Goal Assessed  understanding of CAD diagnosis Medications reviewed including medications utilized in CAD treatment plan Provided education on importance of blood pressure control in management of CAD Provided education on Importance of limiting foods high in cholesterol Counseled on importance of regular laboratory monitoring as prescribed Reviewed Importance of taking all medications as prescribed Reviewed Importance of attending all scheduled provider appointments Advised to report any changes in symptoms or exercise tolerance Assessed social determinant of health barriers 03/24/22:  Patient currently not taking any of her medications.  Heart Failure Interventions:  (Status:  New goal.) Long Term Goal Basic overview and discussion of pathophysiology of Heart Failure reviewed Discussed the importance of keeping all appointments with provider Assessed social determinant of health barriers  03/24/22:  patient not currently taking any of her medications    (Status:  New goal.)  Long Term Goal Evaluation of current treatment plan related to  CHF, GERD, asthma, osteoarthritis, constipation, sleep apnea , Transportation and Housing barriers self-management and patient's adherence to plan as established by provider. Discussed plans with patient for ongoing care management follow up and provided patient with direct contact information for care management team Evaluation of current treatment plan related to chronic health conditions  and patient's adherence to plan as established by provider Advised patient to provide appropriate vaccination information to provider or CM team member at next visit Advised patient to contact [employer] as a resource Reviewed medications with patient  Provided  patient with information about [employer] and transportation Reviewed scheduled/upcoming provider appointments Discussed plans with patient for ongoing care management follow up and provided patient with direct contact  information for care management team 03/24/22:  Patient states she contacted [employer] and is currently working with them.  SDOH Barriers (Status:  New goal.) Long Term Goal Patient interviewed and SDOH assessment performed        Patient interviewed and appropriate assessments performed Provided patient with information about [employer] Aid and [facility] Transportation services. Discussed plans with patient for ongoing care management follow up and provided patient with direct contact information for care management team  Patient Goals/Self-Care Activities: Take all medications as prescribed Attend all scheduled provider appointments Call pharmacy for medication refills 3-7 days in advance of running out of medications Perform all self care activities independently  Perform IADL's (shopping, preparing meals, housekeeping, managing finances) independently Call provider office for new concerns or questions   Follow Up Plan:  The patient has been provided with contact information for the care management team and has been advised to call with any health related questions or concerns.  The care management team will reach out to the patient again over the next 30 business  days.

## 2022-08-01 NOTE — Patient Outreach (Signed)
Medicaid Managed Care   Nurse Care Manager Note  08/01/2022 Name:  Kelsey Bullock MRN:  [id number] DOB:  [birth year]  Kelsey Bullock is an 62 y.o. year old female who is a primary patient of Dickie La, MD.  The [facility] Managed Care Coordination team was consulted for assistance with:    Chronic healthcare management needs, CHF, GERD, asthma, sleep apnea, osteoarthritis, LBP, constipation, anxiety/depression, fibromyalgia  Kelsey Bullock was given information about Medicaid Managed Care Coordination team services today. Kelsey Bullock Patient agreed to services and verbal consent obtained.  Engaged with patient by telephone for follow up visit in response to provider referral for case management and/or care coordination services.   Assessments/Interventions:  Review of past medical history, allergies, medications, health status, including review of consultants reports, laboratory and other test data, was performed as part of comprehensive evaluation and provision of chronic care management services.  SDOH (Social Determinants of Health) assessments and interventions performed: SDOH Interventions    Flowsheet Row Patient Outreach Telephone from 08/01/2022 in [place] Patient Outreach Telephone from 07/06/2022 in [place] Patient Outreach Telephone from 06/07/2022 in [place] Patient Outreach Telephone from 04/03/2022 in [place] Coordination  SDOH Interventions      Food Insecurity Interventions -- -- -- Other (Comment)  [goes to food pantry, BSW referral]  Housing Interventions -- -- -- Other (Comment)  [BSW referral, legal aid resource given]  Utilities Interventions -- -- Intervention Not Indicated --  Alcohol Usage Interventions -- -- Intervention Not Indicated (Score <7) --  Financial Strain Interventions -- Other (Comment)  [patient provided resources] -- --   Physical Activity Interventions Intervention Not Indicated -- -- --  Stress Interventions -- Other (Comment)  [patient has therapist/counselor and Psychiatrist that she sees] -- --  Social Connections Interventions Intervention Not Indicated -- -- --     Care Plan  Allergies  Allergen Reactions   Amoxicillin Anaphylaxis and Swelling   Etodolac Anaphylaxis and Shortness Of Breath     lips swelled  lips swelled  lips swelled   Penicillins Anaphylaxis, Swelling and Rash    DID THE REACTION INVOLVE: Swelling of the face/tongue/throat, SOB, or low BP? Yes Sudden or severe rash/hives, skin peeling, or the inside of the mouth or nose? No Did it require medical treatment? No When did it last happen?      20 years ago If all above answers are "NO", may proceed with cephalosporin use.    Latex Rash and Hives   Pantoprazole Sodium Hives    unspecified REACTION: unspecified   Wound Dressing Adhesive Rash   Ampicillin Swelling   Augmentin [Amoxicillin-Pot Clavulanate] Swelling   Diclofenac Sodium     Unknown reaction    Pantoprazole Sodium     unspecified   Adhesive [Tape] Rash   Medications Reviewed Today     Reviewed by Gayla Medicus, RN (Registered Nurse) on 08/01/22 at 1516  Med List Status: <None>   Medication Order Taking? Sig Documenting Provider Last Dose Status Informant  albuterol (PROVENTIL) (2.5 MG/3ML) 0.083% nebulizer solution [id number]  Take 3 mLs (2.5 mg total) by nebulization every 4 (four) hours as needed. USE 1 VIAL VIA NEBULIZER EVERY 4 TO 6 HOURS AS NEEDED FOR COUGH OR WHEEZING Larose Kells, MD  Active   albuterol (VENTOLIN HFA) 108 (90 Base) MCG/ACT inhaler [id number]  Inhale 2 puffs into the lungs every 4 (four) hours as needed.  Larose Kells, MD  Active   atorvastatin (LIPITOR) 20 MG tablet [id number] No Take 1 tablet (20 mg total) by mouth daily. [name], MD Taking Active   cetirizine (ZYRTEC) 10 MG tablet [id number] No Take 1 tablet (10 mg total) by  mouth 2 (two) times daily as needed for allergies (Can take an extra dose for breakthrough symptoms). Kozlow, Donnamarie Poag, MD Taking Active            Med Note [place], [place] Apr 12, 2022  1:58 PM) Using PRN  clonazePAM (KLONOPIN) 1 MG tablet [id number] No Take 1 mg by mouth as needed. [provider] Taking Active   cyclobenzaprine (FLEXERIL) 10 MG tablet [id number] No Take 1 tablet (10 mg total) by mouth 3 (three) times daily as needed for muscle spasms. [name], MD Taking Active   diclofenac Sodium (VOLTAREN) 1 % GEL [id number] No Apply 4 g topically 4 (four) times daily. [name], MD Taking Active   EPIPEN 2-PAK 0.3 MG/0.3ML SOAJ injection [id number] No Inject 0.3 mg into the muscle as needed for anaphylaxis. Kozlow, Donnamarie Poag, MD Taking Active   famotidine (PEPCID) 20 MG tablet [id number] No Take 1 tablet (20 mg total) by mouth daily. [name], MD Taking Active   fluticasone Asencion Islam) 50 MCG/ACT nasal spray [id number] No Place 1 spray into both nostrils daily as needed for allergies or rhinitis. Jiles Prows, MD Taking Active            Med Note [place]odi [place], [place] Apr 12, 2022  1:56 PM) Using PRN  furosemide (LASIX) 10 MG/ML solution [id number] No Take 10 mg by mouth as needed. [provider] Taking Active   ipratropium (ATROVENT) 0.06 % nasal spray [id number]  Place 2 sprays into both nostrils 4 (four) times daily. USE 2 SPRAYS INTO EACH NOSTRIL EVERY 6 HOURS AS NEEDED FOR TO DRY UP NOSE. Kozlow, Donnamarie Poag, MD  Active   ketoconazole (NIZORAL) 2 % shampoo [id number] No Apply 1 Application topically 2 (two) times a week. [name], MD Taking Active   linaclotide Rolan Lipa) 72 MCG capsule [id number] No Take 2 capsules (144 mcg total) by mouth daily before breakfast. [name], MD Taking Active   mometasone-formoterol [facility]) 200-5 MCG/ACT Hollie Salk [id number]  Take twice daily for 1-2 weeks with respiratory illness or asthma flare. Larose Kells,  MD  Active   montelukast (SINGULAIR) 10 MG tablet [id number] No Take one tablet by mouth at bedtime  Patient taking differently: as needed. Take one tablet by mouth at bedtime   Kozlow, Donnamarie Poag, MD Taking Active   mupirocin ointment (BACTROBAN) 2 % 0000000 No Apply 1 Application topically 2 (two) times daily. [name], MD Taking Active   nystatin powder [id number] No APPLY TOPICALLY TO THE AFFECTED AREA THREE TIMES DAILY Hensel, [name], MD Taking Active   omalizumab [name]) 150 MG/ML prefilled syringe [id number] No Inject 300 mg into the skin every 28 (twenty-eight) days. Kozlow, Donnamarie Poag, MD Taking Active   omalizumab [name]) injection 300 mg [id number]   Jiles Prows, MD  Active   Probiotic Product (PROBIOTIC DAILY PO) [id number] No Take 1 capsule by mouth as needed. [provider] Taking Active Self  triamcinolone (KENALOG) 0.025 % ointment [id number] No Apply topically 2 (two) times daily. [name], MD Taking Active            Patient Active Problem List  Diagnosis Date Noted   Folliculitis [id number]   Trigger finger of right thumb 09/06/2021   Primary osteoarthritis of left knee 08/15/2021   Eustachian tube dysfunction, bilateral 07/18/2021   Vaginal pruritus 07/18/2021   Photosensitivity dermatitis 01/06/2021   Recurrent ventral incisional hernia 11/25/2020   Ventral hernia 10/21/2020   Polypharmacy 10/21/2020   Dehydration 04/19/2020   Pap smear for cervical cancer screening 02/25/2020   Constipation 03/22/2019   Hx of adenomatous colonic polyps    Pilonidal cyst [id number]   Diastolic CHF with preserved left ventricular function, NYHA class 2 ([place]) 12/14/2016   GERD (gastroesophageal reflux disease) 02/06/2015   Severe persistent asthma 02/06/2015   Intertrigo 10/22/2014   Depression, major, recurrent ([place]) 02/19/2014   Sleep apnea 03/10/2009   Seborrheic eczema 07/17/2007   LOW BACK PAIN SYNDROME 11/26/2006   HYPERCHOLESTEROLEMIA  08/02/2006   Morbid obesity ([place]) 08/02/2006   Anxiety state 08/02/2006   PSEUDOTUMOR CEREBRI 08/02/2006   OSTEOARTHRITIS, LOWER LEG 08/02/2006   FIBROMYALGIA, FIBROMYOSITIS 08/02/2006   Conditions to be addressed/monitored per PCP order:  Chronic healthcare management needs, CHF, GERD, asthma, sleep apnea, osteoarthritis, LBP, constipation, anxiety/depression, fibromyalgia  Care Plan : RN Care Manager Plan of Care  Updates made by Gayla Medicus, RN since 08/01/2022 12:00 AM     Problem: Health Promotion or Disease Self-Management (General Plan of Care)      Long-Range Goal: Chronic Disease Management and Care Coordination Needs   Start Date: 02/27/2022  Expected End Date: 10/30/2022  Priority: High  Note:   Current Barriers:  Knowledge Deficits related to plan of care for management of CHF, GERD, asthma, osteoarthritis, constipation, sleep apnea Care Coordination needs related to Transportation and Housing barriers Chronic Disease Management support and education needs related to CHF, GERD, Asthma,osteoarthritis, constipation, sleep apnea. 08/01/22:  No complaints today other than soreness from fall at home-left knee and elbow-seen in ED 2/21-no injury.  Continues therapy-does not take meds as directed.  RNCM Clinical Goal(s):  Patient will verbalize understanding of plan for management of CHF, GERD, asthma, osteoarthritis, constipation, sleep apnea as evidenced by patient report verbalize basic understanding of  CHF, GERD, asthma, osteoarthritis, constipation, sleep apnea disease process and self health management plan as evidenced by patient report take all medications exactly as prescribed and will call provider for medication related questions as evidenced by patient report demonstrate understanding of rationale for each prescribed medication as evidenced by patient report attend all scheduled medical appointments as evidenced by patient report continue to work with RN Care Manager  to address care management and care coordination needs related to  CHF, GERD, asthma, osteoarthritis, constipation, sleep apnea as evidenced by adherence to CM Team Scheduled appointments through collaboration with RN Care manager, provider, and care team.   Interventions: Inter-disciplinary care team collaboration (see longitudinal plan of care) Evaluation of current treatment plan related to  self management and patient's adherence to plan as established by provider Pharmacy referral for medication review Collaborated with Pharmacy BSW for food and legal resources-completed Collaborated with BSW  Asthma: (Status:New goal.) Long Term Goal Provided patient with basic written and verbal Asthma education on self care/management/and exacerbation prevention Advised patient to track and manage Asthma triggers Provided education about and advised patient to utilize infection prevention strategies to reduce risk of respiratory infection Discussed the importance of adequate rest and management of fatigue with Asthma Assessed social determinant of health barriers  04/03/22:  patient currently not taking medications-has medications to use as needed.  CAD Interventions: (  Status:  New goal.) Long Term Goal Assessed understanding of CAD diagnosis Medications reviewed including medications utilized in CAD treatment plan Provided education on importance of blood pressure control in management of CAD Provided education on Importance of limiting foods high in cholesterol Counseled on importance of regular laboratory monitoring as prescribed Reviewed Importance of taking all medications as prescribed Reviewed Importance of attending all scheduled provider appointments Advised to report any changes in symptoms or exercise tolerance Assessed social determinant of health barriers 03/24/22:  Patient currently not taking any of her medications.  Heart Failure Interventions:  (Status:  New goal.) Long Term  Goal Basic overview and discussion of pathophysiology of Heart Failure reviewed Discussed the importance of keeping all appointments with provider Assessed social determinant of health barriers  03/24/22:  patient not currently taking any of her medications    (Status:  New goal.)  Long Term Goal Evaluation of current treatment plan related to  CHF, GERD, asthma, osteoarthritis, constipation, sleep apnea , Transportation and Housing barriers self-management and patient's adherence to plan as established by provider. Discussed plans with patient for ongoing care management follow up and provided patient with direct contact information for care management team Evaluation of current treatment plan related to chronic health conditions  and patient's adherence to plan as established by provider Advised patient to provide appropriate vaccination information to provider or CM team member at next visit Advised patient to contact Legal Aid as a resource Reviewed medications with patient  Provided patient with information about Legal Aid and transportation Reviewed scheduled/upcoming provider appointments Discussed plans with patient for ongoing care management follow up and provided patient with direct contact information for care management team 03/24/22:  Patient states she contacted Legal Aid and is currently working with them.  SDOH Barriers (Status:  New goal.) Long Term Goal Patient interviewed and SDOH assessment performed        Patient interviewed and appropriate assessments performed Provided patient with information about Legal Aid and [facility] Transportation services. Discussed plans with patient for ongoing care management follow up and provided patient with direct contact information for care management team  Patient Goals/Self-Care Activities: Take all medications as prescribed Attend all scheduled provider appointments Call pharmacy for medication refills 3-7 days in advance of running  out of medications Perform all self care activities independently  Perform IADL's (shopping, preparing meals, housekeeping, managing finances) independently Call provider office for new concerns or questions   Follow Up Plan:  The patient has been provided with contact information for the care management team and has been advised to call with any health related questions or concerns.  The care management team will reach out to the patient again over the next 30 business  days.     Long-Range Goal: Establish Plan of Care for Chronic Disease Management Needs   Priority: High  Note:   Timeframe:  Long-Range Goal Priority:  High Start Date:   02/27/22                          Expected End Date:ongoing                       Follow Up Date 08/30/22   - practice safe sex - schedule appointment for flu shot - schedule appointment for vaccines needed due to my age or health - schedule recommended health tests (blood work, mammogram, colonoscopy, pap test) - schedule and keep appointment for annual check-up  Why is this important?   Screening tests can find diseases early when they are easier to treat.  Your doctor or nurse will talk with you about which tests are important for you.  Getting shots for common diseases like the flu and shingles will help prevent them.   08/01/22:  Up to date on appts, immunotherapy appt in March, continues therapy appts.   Follow Up:  Patient agrees to Care Plan and Follow-up.  Plan: The Managed Medicaid care management team will reach out to the patient again over the next 30 business  days. and The  Patient has been provided with contact information for the Managed Medicaid care management team and has been advised to call with any health related questions or concerns.  Date/time of next scheduled RN care management/care coordination outreach:  08/30/22 at 230

## 2022-08-16 DIAGNOSIS — H1045 Other chronic allergic conjunctivitis: Secondary | ICD-10-CM | POA: Diagnosis not present

## 2022-08-16 DIAGNOSIS — H04123 Dry eye syndrome of bilateral lacrimal glands: Secondary | ICD-10-CM | POA: Diagnosis not present

## 2022-08-16 DIAGNOSIS — H40033 Anatomical narrow angle, bilateral: Secondary | ICD-10-CM | POA: Diagnosis not present

## 2022-08-16 DIAGNOSIS — H0102A Squamous blepharitis right eye, upper and lower eyelids: Secondary | ICD-10-CM | POA: Diagnosis not present

## 2022-08-16 DIAGNOSIS — H47323 Drusen of optic disc, bilateral: Secondary | ICD-10-CM | POA: Diagnosis not present

## 2022-08-16 DIAGNOSIS — L719 Rosacea, unspecified: Secondary | ICD-10-CM | POA: Diagnosis not present

## 2022-08-16 DIAGNOSIS — H40023 Open angle with borderline findings, high risk, bilateral: Secondary | ICD-10-CM | POA: Diagnosis not present

## 2022-08-16 DIAGNOSIS — H0102B Squamous blepharitis left eye, upper and lower eyelids: Secondary | ICD-10-CM | POA: Diagnosis not present

## 2022-08-17 ENCOUNTER — Ambulatory Visit (INDEPENDENT_AMBULATORY_CARE_PROVIDER_SITE_OTHER): Payer: Medicaid Other

## 2022-08-17 DIAGNOSIS — J4540 Moderate persistent asthma, uncomplicated: Secondary | ICD-10-CM | POA: Diagnosis not present

## 2022-08-30 ENCOUNTER — Encounter: Payer: Self-pay | Admitting: Obstetrics and Gynecology

## 2022-08-30 ENCOUNTER — Other Ambulatory Visit: Payer: Medicaid Other | Admitting: Obstetrics and Gynecology

## 2022-08-30 NOTE — Patient Instructions (Signed)
Hi Ms. Mirenda, thanks for speaking with me-I appreciate it!  Ms. Momin was given information about Medicaid Managed Care team care coordination services as a part of their [place] Medicaid benefit. JOSSETTE RISSO verbally consented to engagement with the [facility] Managed Care team.   If you are experiencing a medical emergency, please call 911 or report to your local emergency department or urgent care.   If you have a non-emergency medical problem during routine business hours, please contact your provider's office and ask to speak with a nurse.   For questions related to your [facility], please call: [phone] or visit the homepage here: [email or web address]  If you would like to schedule transportation through your [facility], please call the following number at least 2 days in advance of your appointment: [phone]   Rides for urgent appointments can also be made after hours by calling Member Services.  Call the [place] at [phone], at any time, 24 hours a day, 7 days a week. If you are in danger or need immediate medical attention call 911.  If you would like help to quit smoking, call 1-800-QUIT-NOW (514)282-4211) OR Espaol: 1-855-Djelo-Ya QO:409462) o para ms informacin haga clic aqu or Text READY to 200-400 to register via text  Ms. Zupko - following are the goals we discussed in your visit today:   Goals Addressed    Timeframe:  Long-Range Goal Priority:  High Start Date:   02/27/22                          Expected End Date:ongoing                       Follow Up Date 10/30/22   - practice safe sex - schedule appointment for flu shot - schedule appointment for vaccines needed due to my age or health - schedule recommended health tests (blood work, mammogram, colonoscopy, pap test) - schedule  and keep appointment for annual check-up    Why is this important?   Screening tests can find diseases early when they are easier to treat.  Your doctor or nurse will talk with you about which tests are important for you.  Getting shots for common diseases like the flu and shingles will help prevent them.   08/30/22:  immunotherapy appt in March, continues therapy appts.  Sees Allergy and Asthma 4/  Patient verbalizes understanding of instructions and care plan provided today and agrees to view in [place]. Active MyChart status and patient understanding of how to access instructions and care plan via MyChart confirmed with patient.     The Managed Medicaid care management team will reach out to the patient again over the next 60 business  days.  The  Patient has been provided with contact information for the Managed Medicaid care management team and has been advised to call with any health related questions or concerns.   Aida Raider RN, BSN [place] Management Coordinator - Managed Medicaid High Risk [phone]   Following is a copy of your plan of care:  Care Plan : [place] of Care  Updates made by Gayla Medicus, RN since 08/30/2022 12:00 AM     Problem: Health Promotion or Disease Self-Management (General Plan of Care)      Long-Range Goal: Chronic Disease Management and Care Coordination Needs  Start Date: 02/27/2022  Expected End Date: 10/30/2022  Priority: High  Note:   Current Barriers:  Knowledge Deficits related to plan of care for management of CHF, GERD, asthma, osteoarthritis, constipation, sleep apnea Care Coordination needs related to Transportation and Housing barriers Chronic Disease Management support and education needs related to CHF, GERD, Asthma,osteoarthritis, constipation, sleep apnea. 08/30/22:  Non adherence to medications as prescribed.  States she cannot afford meds sometimes and also has a lot of them and  doesn't take them.  Dealing with lots of family health issues right now.    RNCM Clinical Goal(s):  Patient will verbalize understanding of plan for management of CHF, GERD, asthma, osteoarthritis, constipation, sleep apnea as evidenced by patient report verbalize basic understanding of  CHF, GERD, asthma, osteoarthritis, constipation, sleep apnea disease process and self health management plan as evidenced by patient report take all medications exactly as prescribed and will call provider for medication related questions as evidenced by patient report demonstrate understanding of rationale for each prescribed medication as evidenced by patient report attend all scheduled medical appointments as evidenced by patient report continue to work with RN Care Manager to address care management and care coordination needs related to  CHF, GERD, asthma, osteoarthritis, constipation, sleep apnea as evidenced by adherence to CM Team Scheduled appointments through collaboration with RN Care manager, provider, and care team.   Interventions: Inter-disciplinary care team collaboration (see longitudinal plan of care) Evaluation of current treatment plan related to  self management and patient's adherence to plan as established by provider Pharmacy referral for medication review Collaborated with Pharmacy BSW for food and legal resources-completed Collaborated with BSW  Asthma: (Status:New goal.) Long Term Goal Provided patient with basic written and verbal Asthma education on self care/management/and exacerbation prevention Advised patient to track and manage Asthma triggers Provided education about and advised patient to utilize infection prevention strategies to reduce risk of respiratory infection Discussed the importance of adequate rest and management of fatigue with Asthma Assessed social determinant of health barriers   CAD Interventions: (Status:  New goal.) Long Term Goal Assessed understanding  of CAD diagnosis Medications reviewed including medications utilized in CAD treatment plan Provided education on importance of blood pressure control in management of CAD Provided education on Importance of limiting foods high in cholesterol Counseled on importance of regular laboratory monitoring as prescribed Reviewed Importance of taking all medications as prescribed Reviewed Importance of attending all scheduled provider appointments Advised to report any changes in symptoms or exercise tolerance Assessed social determinant of health barriers  Heart Failure Interventions:  (Status:  New goal.) Long Term Goal Basic overview and discussion of pathophysiology of Heart Failure reviewed Discussed the importance of keeping all appointments with provider Assessed social determinant of health barriers     (Status:  New goal.)  Long Term Goal Evaluation of current treatment plan related to  CHF, GERD, asthma, osteoarthritis, constipation, sleep apnea , Transportation and Housing barriers self-management and patient's adherence to plan as established by provider. Discussed plans with patient for ongoing care management follow up and provided patient with direct contact information for care management team Evaluation of current treatment plan related to chronic health conditions  and patient's adherence to plan as established by provider Advised patient to provide appropriate vaccination information to provider or CM team member at next visit Advised patient to contact Legal Aid as a resource Reviewed medications with patient  Provided patient with information about Legal Aid and transportation Reviewed scheduled/upcoming provider appointments Discussed plans  with patient for ongoing care management follow up and provided patient with direct contact information for care management team 03/24/22:  Patient states she contacted Legal Aid and is currently working with them.  SDOH Barriers (Status:   New goal.) Long Term Goal Patient interviewed and SDOH assessment performed        Patient interviewed and appropriate assessments performed Provided patient with information about Legal Aid and [facility] Transportation services. Discussed plans with patient for ongoing care management follow up and provided patient with direct contact information for care management team  Patient Goals/Self-Care Activities: Take all medications as prescribed Attend all scheduled provider appointments Call pharmacy for medication refills 3-7 days in advance of running out of medications Perform all self care activities independently  Perform IADL's (shopping, preparing meals, housekeeping, managing finances) independently Call provider office for new concerns or questions   Follow Up Plan:  The patient has been provided with contact information for the care management team and has been advised to call with any health related questions or concerns.  The care management team will reach out to the patient again over the next 60  business  days.

## 2022-08-30 NOTE — Patient Outreach (Signed)
Medicaid Managed Care   Nurse Care Manager Note  08/30/2022 Name:  Kelsey Bullock MRN:  [id number] DOB:  04-Jun-1961  Kelsey Bullock is an 62 y.o. year old female who is a primary patient of Dickie La, MD.  [facility] Managed Care Coordination team was consulted for assistance with:    Chronic healthcare management needs, CHF, GERD, asthma, OSA, osteoarhtritis, LBP, anxiety/depression, constipation  Kelsey Bullock was given information about Medicaid Managed Care Coordination team services today. Kelsey Bullock Patient agreed to services and verbal consent obtained.  Engaged with patient by telephone for follow up visit in response to provider referral for case management and/or care coordination services.   Assessments/Interventions:  Review of past medical history, allergies, medications, health status, including review of consultants reports, laboratory and other test data, was performed as part of comprehensive evaluation and provision of chronic care management services.  SDOH (Social Determinants of Health) assessments and interventions performed: SDOH Interventions    Flowsheet Row Patient Outreach Telephone from 08/30/2022 in [place] Patient Outreach Telephone from 08/01/2022 in [place] Patient Outreach Telephone from 07/06/2022 in [place] Patient Outreach Telephone from 06/07/2022 in [place] Patient Outreach Telephone from 04/03/2022 in [place] Coordination  SDOH Interventions       Food Insecurity Interventions -- -- -- -- Other (Comment)  [goes to food pantry, BSW referral]  Housing Interventions -- -- -- -- Other (Comment)  [BSW referral, legal aid resource given]  Transportation Interventions Intervention Not Indicated -- -- -- --  Utilities Interventions -- -- -- Intervention Not Indicated --  Alcohol Usage  Interventions -- -- -- Intervention Not Indicated (Score <7) --  Financial Strain Interventions -- -- Other (Comment)  [patient provided resources] -- --  Physical Activity Interventions -- Intervention Not Indicated -- -- --  Stress Interventions -- -- Other (Comment)  [patient has therapist/counselor and Psychiatrist that she sees] -- --  Social Connections Interventions -- Intervention Not Indicated -- -- --     Care Plan  Allergies  Allergen Reactions   Amoxicillin Anaphylaxis and Swelling   Etodolac Anaphylaxis and Shortness Of Breath     lips swelled  lips swelled  lips swelled   Penicillins Anaphylaxis, Swelling and Rash    DID THE REACTION INVOLVE: Swelling of the face/tongue/throat, SOB, or low BP? Yes Sudden or severe rash/hives, skin peeling, or the inside of the mouth or nose? No Did it require medical treatment? No When did it last happen?      20 years ago If all above answers are "NO", may proceed with cephalosporin use.    Latex Rash and Hives   Pantoprazole Sodium Hives    unspecified REACTION: unspecified   Wound Dressing Adhesive Rash   Ampicillin Swelling   Augmentin [Amoxicillin-Pot Clavulanate] Swelling   Diclofenac Sodium     Unknown reaction    Pantoprazole Sodium     unspecified   Adhesive [Tape] Rash   Medications Reviewed Today     Reviewed by Gayla Medicus, RN (Registered Nurse) on 08/30/22 at 1335  Med List Status: <None>   Medication Order Taking? Sig Documenting Provider Last Dose Status Informant  albuterol (PROVENTIL) (2.5 MG/3ML) 0.083% nebulizer solution [id number] No Take 3 mLs (2.5 mg total) by nebulization every 4 (four) hours as needed. USE 1 VIAL VIA NEBULIZER EVERY 4 TO 6 HOURS AS NEEDED FOR COUGH OR WHEEZING  Patient  not taking: Reported on 08/30/2022   Larose Kells, MD Not Taking Active   albuterol (VENTOLIN HFA) 108 (90 Base) MCG/ACT inhaler [id number] No Inhale 2 puffs into the lungs every 4 (four) hours as needed.  Patient not  taking: Reported on 08/30/2022   Larose Kells, MD Not Taking Active   atorvastatin (LIPITOR) 20 MG tablet [id number] No Take 1 tablet (20 mg total) by mouth daily.  Patient not taking: Reported on 08/30/2022   [name], MD Not Taking Active   cetirizine (ZYRTEC) 10 MG tablet [id number] No Take 1 tablet (10 mg total) by mouth 2 (two) times daily as needed for allergies (Can take an extra dose for breakthrough symptoms).  Patient not taking: Reported on 08/30/2022   Jiles Prows, MD Not Taking Active            Med Note Jodi Mourning, [place] Apr 12, 2022  1:58 PM) Using PRN  clonazePAM (KLONOPIN) 1 MG tablet [id number] No Take 1 mg by mouth as needed.  Patient not taking: Reported on 08/30/2022   [provider] Not Taking Active   cyclobenzaprine (FLEXERIL) 10 MG tablet [id number] No Take 1 tablet (10 mg total) by mouth 3 (three) times daily as needed for muscle spasms.  Patient not taking: Reported on 08/30/2022   [name], MD Not Taking Active   diclofenac Sodium (VOLTAREN) 1 % GEL [id number] No Apply 4 g topically 4 (four) times daily.  Patient not taking: Reported on 08/30/2022   [name], MD Not Taking Active   EPIPEN 2-PAK 0.3 MG/0.3ML SOAJ injection [id number] No Inject 0.3 mg into the muscle as needed for anaphylaxis.  Patient not taking: Reported on 08/30/2022   Jiles Prows, MD Not Taking Active   famotidine (PEPCID) 20 MG tablet [id number] No Take 1 tablet (20 mg total) by mouth daily.  Patient not taking: Reported on 08/30/2022   [name], MD Not Taking Active   fluticasone [facility]) 50 MCG/ACT nasal spray [id number] No Place 1 spray into both nostrils daily as needed for allergies or rhinitis.  Patient not taking: Reported on 08/30/2022   Jiles Prows, MD Not Taking Active            Med Note Jodi Mourning, [place] Apr 12, 2022  1:56 PM) Using PRN  furosemide (LASIX) 10 MG/ML solution [id number] No Take 10 mg by mouth as needed.  Patient  not taking: Reported on 08/30/2022   [provider] Not Taking Active   ipratropium (ATROVENT) 0.06 % nasal spray [id number] No Place 2 sprays into both nostrils 4 (four) times daily. USE 2 SPRAYS INTO EACH NOSTRIL EVERY 6 HOURS AS NEEDED FOR TO DRY UP NOSE.  Patient not taking: Reported on 08/30/2022   Jiles Prows, MD Not Taking Active   ketoconazole (NIZORAL) 2 % shampoo [id number] No Apply 1 Application topically 2 (two) times a week.  Patient not taking: Reported on 08/30/2022   [name], MD Not Taking Active   linaclotide Rolan Lipa) 72 MCG capsule [id number] No Take 2 capsules (144 mcg total) by mouth daily before breakfast.  Patient not taking: Reported on 08/30/2022   [name], MD Not Taking Active   mometasone-formoterol [facility]) 200-5 MCG/ACT Hollie Salk [id number] No Take twice daily for 1-2 weeks with respiratory illness or asthma flare.  Patient not taking: Reported on 08/30/2022   Larose Kells, MD Not Taking Active   montelukast (SINGULAIR)  10 MG tablet [id number] No Take one tablet by mouth at bedtime  Patient not taking: Reported on 08/30/2022   Jiles Prows, MD Not Taking Active   mupirocin ointment (BACTROBAN) 2 % 0000000 No Apply 1 Application topically 2 (two) times daily.  Patient not taking: Reported on 08/30/2022   [name], MD Not Taking Active   nystatin powder [id number] No APPLY TOPICALLY TO THE AFFECTED AREA THREE TIMES DAILY  Patient not taking: Reported on 08/30/2022   [name], MD Not Taking Active   omalizumab [name]) 150 MG/ML prefilled syringe [id number] No Inject 300 mg into the skin every 28 (twenty-eight) days.  Patient not taking: Reported on 08/30/2022   Jiles Prows, MD Not Taking Active   omalizumab [name]) injection 300 mg [id number]   Jiles Prows, MD  Active   Probiotic Product (PROBIOTIC DAILY PO) [id number] No Take 1 capsule by mouth as needed.  Patient not taking: Reported on 08/30/2022   [provider] Not Taking Active Self  triamcinolone (KENALOG) 0.025 % ointment [id number] No Apply topically 2 (two) times daily.  Patient not taking: Reported on 08/30/2022   [name], MD Not Taking Active            Patient Active Problem List   Diagnosis Date Noted   Folliculitis [id number]   Trigger finger of right thumb 09/06/2021   Primary osteoarthritis of left knee 08/15/2021   Eustachian tube dysfunction, bilateral 07/18/2021   Vaginal pruritus 07/18/2021   Photosensitivity dermatitis 01/06/2021   Recurrent ventral incisional hernia 11/25/2020   Ventral hernia 10/21/2020   Polypharmacy 10/21/2020   Dehydration 04/19/2020   Pap smear for cervical cancer screening 02/25/2020   Constipation 03/22/2019   Hx of adenomatous colonic polyps    Pilonidal cyst [id number]   Diastolic CHF with preserved left ventricular function, NYHA class 2 ([place]) 12/14/2016   GERD (gastroesophageal reflux disease) 02/06/2015   Severe persistent asthma 02/06/2015   Intertrigo 10/22/2014   Depression, major, recurrent ([place]) 02/19/2014   Sleep apnea 03/10/2009   Seborrheic eczema 07/17/2007   LOW BACK PAIN SYNDROME 11/26/2006   HYPERCHOLESTEROLEMIA 08/02/2006   Morbid obesity ([place]) 08/02/2006   Anxiety state 08/02/2006   PSEUDOTUMOR CEREBRI 08/02/2006   OSTEOARTHRITIS, LOWER LEG 08/02/2006   FIBROMYALGIA, FIBROMYOSITIS 08/02/2006   Conditions to be addressed/monitored per PCP order:  Chronic healthcare management needs, CHF, GERD, asthma, OSA, osteoarhtritis, LBP, anxiety/depression, constipation  Care Plan : RN Care Manager Plan of Care  Updates made by Gayla Medicus, RN since 08/30/2022 12:00 AM     Problem: Health Promotion or Disease Self-Management (General Plan of Care)      Long-Range Goal: Chronic Disease Management and Care Coordination Needs   Start Date: 02/27/2022  Expected End Date: 10/30/2022  Priority: High  Note:   Current Barriers:  Knowledge Deficits related  to plan of care for management of CHF, GERD, asthma, osteoarthritis, constipation, sleep apnea Care Coordination needs related to Transportation and Housing barriers Chronic Disease Management support and education needs related to CHF, GERD, Asthma,osteoarthritis, constipation, sleep apnea. 08/30/22:  Non adherence to medications as prescribed.  States she cannot afford meds sometimes and also has a lot of them and doesn't take them.  Dealing with lots of family health issues right now.    RNCM Clinical Goal(s):  Patient will verbalize understanding of plan for management of CHF, GERD, asthma, osteoarthritis, constipation, sleep apnea as evidenced by patient report verbalize basic understanding of  CHF,  GERD, asthma, osteoarthritis, constipation, sleep apnea disease process and self health management plan as evidenced by patient report take all medications exactly as prescribed and will call provider for medication related questions as evidenced by patient report demonstrate understanding of rationale for each prescribed medication as evidenced by patient report attend all scheduled medical appointments as evidenced by patient report continue to work with RN Care Manager to address care management and care coordination needs related to  CHF, GERD, asthma, osteoarthritis, constipation, sleep apnea as evidenced by adherence to CM Team Scheduled appointments through collaboration with RN Care manager, provider, and care team.   Interventions: Inter-disciplinary care team collaboration (see longitudinal plan of care) Evaluation of current treatment plan related to  self management and patient's adherence to plan as established by provider Pharmacy referral for medication review Collaborated with Pharmacy BSW for food and legal resources-completed Collaborated with BSW  Asthma: (Status:New goal.) Long Term Goal Provided patient with basic written and verbal Asthma education on self  care/management/and exacerbation prevention Advised patient to track and manage Asthma triggers Provided education about and advised patient to utilize infection prevention strategies to reduce risk of respiratory infection Discussed the importance of adequate rest and management of fatigue with Asthma Assessed social determinant of health barriers   CAD Interventions: (Status:  New goal.) Long Term Goal Assessed understanding of CAD diagnosis Medications reviewed including medications utilized in CAD treatment plan Provided education on importance of blood pressure control in management of CAD Provided education on Importance of limiting foods high in cholesterol Counseled on importance of regular laboratory monitoring as prescribed Reviewed Importance of taking all medications as prescribed Reviewed Importance of attending all scheduled provider appointments Advised to report any changes in symptoms or exercise tolerance Assessed social determinant of health barriers  Heart Failure Interventions:  (Status:  New goal.) Long Term Goal Basic overview and discussion of pathophysiology of Heart Failure reviewed Discussed the importance of keeping all appointments with provider Assessed social determinant of health barriers     (Status:  New goal.)  Long Term Goal Evaluation of current treatment plan related to  CHF, GERD, asthma, osteoarthritis, constipation, sleep apnea , Transportation and Housing barriers self-management and patient's adherence to plan as established by provider. Discussed plans with patient for ongoing care management follow up and provided patient with direct contact information for care management team Evaluation of current treatment plan related to chronic health conditions  and patient's adherence to plan as established by provider Advised patient to provide appropriate vaccination information to provider or CM team member at next visit Advised patient to contact  Legal Aid as a resource Reviewed medications with patient  Provided patient with information about Legal Aid and transportation Reviewed scheduled/upcoming provider appointments Discussed plans with patient for ongoing care management follow up and provided patient with direct contact information for care management team 03/24/22:  Patient states she contacted Legal Aid and is currently working with them.  SDOH Barriers (Status:  New goal.) Long Term Goal Patient interviewed and SDOH assessment performed        Patient interviewed and appropriate assessments performed Provided patient with information about Legal Aid and [facility] Transportation services. Discussed plans with patient for ongoing care management follow up and provided patient with direct contact information for care management team  Patient Goals/Self-Care Activities: Take all medications as prescribed Attend all scheduled provider appointments Call pharmacy for medication refills 3-7 days in advance of running out of medications Perform all self care activities independently  Perform IADL's (shopping, preparing meals, housekeeping, managing finances) independently Call provider office for new concerns or questions   Follow Up Plan:  The patient has been provided with contact information for the care management team and has been advised to call with any health related questions or concerns.  The care management team will reach out to the patient again over the next 60  business  days.    Long-Range Goal: Establish Plan of Care for Chronic Disease Management Needs   Priority: High  Note:   Timeframe:  Long-Range Goal Priority:  High Start Date:   02/27/22                          Expected End Date:ongoing                       Follow Up Date 10/30/22   - practice safe sex - schedule appointment for flu shot - schedule appointment for vaccines needed due to my age or health - schedule recommended health tests (blood work,  mammogram, colonoscopy, pap test) - schedule and keep appointment for annual check-up    Why is this important?   Screening tests can find diseases early when they are easier to treat.  Your doctor or nurse will talk with you about which tests are important for you.  Getting shots for common diseases like the flu and shingles will help prevent them.   08/30/22:  immunotherapy appt in March, continues therapy appts.  Sees Allergy and Asthma 4/30.   Follow Up:  Patient agrees to Care Plan and Follow-up.  Plan: The Managed Medicaid care management team will reach out to the patient again over the next 60 business  days. and The  Patient has been provided with contact information for the Managed Medicaid care management team and has been advised to call with any health related questions or concerns.  Date/time of next scheduled RN care management/care coordination outreach:  10/30/22 at 0900.

## 2022-09-07 ENCOUNTER — Ambulatory Visit: Payer: Medicaid Other | Admitting: Pharmacist

## 2022-09-07 ENCOUNTER — Other Ambulatory Visit: Payer: Medicaid Other | Admitting: Obstetrics and Gynecology

## 2022-09-07 NOTE — Patient Outreach (Signed)
Care Coordination  09/07/2022  Kelsey Bullock 02/06/1961 [id number]  RNCM called patient and left message.  Patient had appointment with Managed Medicaid Pharmacist this afternoon and was canceled.  RNCM left message provided by Managed Medicaid Pharmacist-"if she is looking to cut something back, she needs to talk to Dr. Neldon Mc about the benefit vs risk" RNCM will follow up as scheduled.  Kelsey Raider RN, BSN [place]  [facility] - Managed Medicaid High Risk [phone].

## 2022-09-14 ENCOUNTER — Ambulatory Visit (INDEPENDENT_AMBULATORY_CARE_PROVIDER_SITE_OTHER): Payer: Medicaid Other

## 2022-09-14 DIAGNOSIS — J4550 Severe persistent asthma, uncomplicated: Secondary | ICD-10-CM

## 2022-09-29 ENCOUNTER — Other Ambulatory Visit: Payer: Self-pay

## 2022-09-29 DIAGNOSIS — E7800 Pure hypercholesterolemia, unspecified: Secondary | ICD-10-CM

## 2022-09-29 MED ORDER — ATORVASTATIN CALCIUM 20 MG PO TABS: 20.0000 mg | ORAL_TABLET | Freq: Every day | ORAL | 3 refills | Status: DC

## 2022-10-03 ENCOUNTER — Ambulatory Visit (INDEPENDENT_AMBULATORY_CARE_PROVIDER_SITE_OTHER): Payer: Medicaid Other | Admitting: Allergy and Immunology

## 2022-10-03 VITALS — BP 130/66 | HR 62 | Temp 98.5°F | Resp 18 | Ht 63.39 in | Wt 288.3 lb

## 2022-10-03 DIAGNOSIS — J4550 Severe persistent asthma, uncomplicated: Secondary | ICD-10-CM

## 2022-10-03 DIAGNOSIS — J3089 Other allergic rhinitis: Secondary | ICD-10-CM

## 2022-10-03 DIAGNOSIS — J45901 Unspecified asthma with (acute) exacerbation: Secondary | ICD-10-CM

## 2022-10-03 DIAGNOSIS — K219 Gastro-esophageal reflux disease without esophagitis: Secondary | ICD-10-CM

## 2022-10-03 MED ORDER — MONTELUKAST SODIUM 10 MG PO TABS: 10.0000 mg | ORAL_TABLET | Freq: Every evening | ORAL | 1 refills | Status: DC

## 2022-10-03 MED ORDER — ALBUTEROL SULFATE HFA 108 (90 BASE) MCG/ACT IN AERS: 2.0000 | INHALATION_SPRAY | RESPIRATORY_TRACT | 1 refills | Status: DC | PRN

## 2022-10-03 MED ORDER — FAMOTIDINE 20 MG PO TABS: 20.0000 mg | ORAL_TABLET | Freq: Every morning | ORAL | 1 refills | Status: DC

## 2022-10-03 MED ORDER — DULERA 200-5 MCG/ACT IN AERO: 2.0000 | INHALATION_SPRAY | Freq: Two times a day (BID) | RESPIRATORY_TRACT | 1 refills | Status: DC

## 2022-10-03 MED ORDER — NEBULIZER MASK ADULT MISC: 1.0000 | 1 refills | Status: DC | PRN

## 2022-10-03 MED ORDER — FLUTICASONE PROPIONATE 50 MCG/ACT NA SUSP: 1.0000 | Freq: Every day | NASAL | 1 refills | Status: DC | PRN

## 2022-10-03 MED ORDER — ALBUTEROL SULFATE (2.5 MG/3ML) 0.083% IN NEBU: 2.5000 mg | INHALATION_SOLUTION | RESPIRATORY_TRACT | 1 refills | Status: DC | PRN

## 2022-10-03 MED ORDER — EPIPEN 2-PAK 0.3 MG/0.3ML IJ SOAJ: 0.3000 mg | INTRAMUSCULAR | 1 refills | Status: DC | PRN

## 2022-10-03 NOTE — Progress Notes (Unsigned)
[place] - [place] - [place] - [place] - [place]   Follow-up Note  Referring Provider: Moses Manners, MD Primary Provider: Nestor Ramp, MD Date of Office Visit: 10/03/2022  Subjective:   Kelsey Bullock (DOB: 1961/04/10) is a 62 y.o. female who [facility] in re-evaluation of the following:  HPI: [name] returns to this clinic in evaluation of asthma, allergic rhinitis, LPR.  I last saw her in this clinic 28 March 2022.  She did visit with Dr. Allena Katz 09 June 2022 with acute COVID for which she used Paxlovid.  She did well with her COVID infection and did not have any long-term sequela.  Currently she is doing very well with her asthma and does not use any controller agent other than her omalizumab injections.  She does not use any Dulera and she does not use any montelukast and she has no need to use any albuterol.  Likewise she does very well while with her upper airway issue while rarely using any Flonase or nasal azelastine or antihistamines.  She has not required a systemic steroid or antibiotic for any type of airway issues since her last visit.  Her reflux is under good control at this point in time.  Allergies as of 10/03/2022       Reactions   Amoxicillin Anaphylaxis, Swelling   Etodolac Anaphylaxis, Shortness Of Breath    lips swelled  lips swelled  lips swelled   Penicillins Anaphylaxis, Swelling, Rash   DID THE REACTION INVOLVE: Swelling of the face/tongue/throat, SOB, or low BP? Yes Sudden or severe rash/hives, skin peeling, or the inside of the mouth or nose? No Did it require medical treatment? No When did it last happen?      20 years ago If all above answers are "NO", may proceed with cephalosporin use.   Latex Rash, Hives   Pantoprazole Sodium Hives   unspecified REACTION: unspecified   Wound Dressing Adhesive Rash   Ampicillin Swelling   Augmentin [amoxicillin-pot Clavulanate] Swelling    Diclofenac Sodium    Unknown reaction    Pantoprazole Sodium    unspecified   Adhesive [tape] Rash        Medication List    albuterol (2.5 MG/3ML) 0.083% nebulizer solution Commonly known as: PROVENTIL Take 3 mLs (2.5 mg total) by nebulization every 4 (four) hours as needed. USE 1 VIAL VIA NEBULIZER EVERY 4 TO 6 HOURS AS NEEDED FOR COUGH OR WHEEZING   albuterol 108 (90 Base) MCG/ACT inhaler Commonly known as: VENTOLIN HFA Inhale 2 puffs into the lungs every 4 (four) hours as needed.   ascorbic acid 500 MG tablet Commonly known as: VITAMIN C Take 1,000 mg by mouth daily.   atorvastatin 20 MG tablet Commonly known as: LIPITOR Take 1 tablet (20 mg total) by mouth daily.   cetirizine 10 MG tablet Commonly known as: ZYRTEC Take 1 tablet (10 mg total) by mouth 2 (two) times daily as needed for allergies (Can take an extra dose for breakthrough symptoms).   clonazePAM 1 MG tablet Commonly known as: KLONOPIN Take 1 mg by mouth as needed.   cyclobenzaprine 10 MG tablet Commonly known as: FLEXERIL Take 1 tablet (10 mg total) by mouth 3 (three) times daily as needed for muscle spasms.   diclofenac Sodium 1 % Gel Commonly known as: VOLTAREN Apply 4 g topically 4 (four) times daily.   Dulera 200-5 MCG/ACT Aero Generic drug: mometasone-formoterol Take twice daily for 1-2  weeks with respiratory illness or asthma flare.   EpiPen 2-Pak 0.3 mg/0.3 mL Soaj injection Generic drug: EPINEPHrine Inject 0.3 mg into the muscle as needed for anaphylaxis.   famotidine 20 MG tablet Commonly known as: PEPCID Take 20 mg by mouth at bedtime.   famotidine 20 MG tablet Commonly known as: PEPCID Take 1 tablet (20 mg total) by mouth daily.   Fish Oil 1200 MG Caps Take 1 capsule by mouth.   fluticasone 50 MCG/ACT nasal spray Commonly known as: FLONASE Place 2 sprays into both nostrils daily.   fluticasone 50 MCG/ACT nasal spray Commonly known as: FLONASE Place 1 spray into both  nostrils daily as needed for allergies or rhinitis.   furosemide 10 MG/ML solution Commonly known as: LASIX Take 10 mg by mouth as needed.   ipratropium 0.06 % nasal spray Commonly known as: ATROVENT Place 2 sprays into both nostrils 4 (four) times daily. USE 2 SPRAYS INTO EACH NOSTRIL EVERY 6 HOURS AS NEEDED FOR TO DRY UP NOSE.   ketoconazole 2 % shampoo Commonly known as: NIZORAL Apply 1 Application topically 2 (two) times a week.   linaclotide 72 MCG capsule Commonly known as: LINZESS Take 2 capsules (144 mcg total) by mouth daily before breakfast.   MG Plus Protein 133 MG Tabs Take 1 tablet by mouth daily.   montelukast 10 MG tablet Commonly known as: SINGULAIR Take one tablet by mouth at bedtime   mupirocin ointment 2 % Commonly known as: BACTROBAN Apply 1 Application topically 2 (two) times daily.   nystatin powder Commonly known as: nystatin APPLY TOPICALLY TO THE AFFECTED AREA THREE TIMES DAILY   Olopatadine HCl 0.7 % Soln Apply 1 drop to eye daily.   omalizumab 150 MG/ML prefilled syringe Commonly known as: Xolair Inject 300 mg into the skin every 28 (twenty-eight) days.   PROBIOTIC DAILY PO Take 1 capsule by mouth as needed.   simvastatin 80 MG tablet Commonly known as: ZOCOR Take 80 mg by mouth at bedtime.   terconazole 0.4 % vaginal cream Commonly known as: TERAZOL 7 Place 1 applicator vaginally at bedtime.   triamcinolone 0.025 % ointment Commonly known as: KENALOG Apply topically 2 (two) times daily.   Vitamin E 100 units Tabs Take 1 capsule by mouth daily.    Past Medical History:  Diagnosis Date   Allergic rhinitis    Anemia    Anxiety    Arthritis    knees and back   Asthma    Complication of anesthesia    Congestive heart failure (CHF) (HCC)    Depression    Gall stone    GERD (gastroesophageal reflux disease)    Headache    migraines, stress headaches   History of kidney stones    Hypertension    Hyperthyroidism    during  pregnancy   Pneumonia    PONV (postoperative nausea and vomiting)    Recurrent upper respiratory infection (URI)    Sleep apnea    can't use Cpap    Past Surgical History:  Procedure Laterality Date   ADENOIDECTOMY     APPLICATION OF WOUND VAC  03/18/2019   APPLICATION OF WOUND VAC N/A 03/18/2019   Procedure: Application Of Wound Vac;  Surgeon: Manus Rudd, MD;  Location: [facility] OR;  Service: General;  Laterality: N/A;   BIOPSY  05/27/2018   Procedure: BIOPSY;  Surgeon: Napoleon Form, MD;  Location: [facility];  Service: Endoscopy;;   CESAREAN SECTION     COLONOSCOPY WITH PROPOFOL  N/A 05/27/2018   Procedure: COLONOSCOPY WITH PROPOFOL;  Surgeon: Napoleon Form, MD;  Location: [facility];  Service: Endoscopy;  Laterality: N/A;   CYST EXCISION     EXPLORATORY LAPAROTOMY  02/03/2019   INGUINAL HERNIA REPAIR N/A 02/03/2019   Procedure: EXPLORATORY LAPAROTOMY WITH SMALL BOWEL RESECTION AND PRIMARY CLOSURE OF VENTRAL HERNIA;  Surgeon: Manus Rudd, MD;  Location: [facility] OR;  Service: General;  Laterality: N/A;   INSERTION OF MESH N/A 11/25/2020   Procedure: INSERTION OF MESH;  Surgeon: Manus Rudd, MD;  Location: [facility] OR;  Service: General;  Laterality: N/A;   OVARIAN CYST REMOVAL     POLYPECTOMY  05/27/2018   Procedure: POLYPECTOMY;  Surgeon: Napoleon Form, MD;  Location: [facility];  Service: Endoscopy;;   removal of gallstones     TONSILLECTOMY     TYMPANOSTOMY TUBE PLACEMENT     VENTRAL HERNIA REPAIR N/A 11/25/2020   Procedure: RECURRENT VENTRAL HERNIA REPAIR WITH MESH;  Surgeon: Manus Rudd, MD;  Location: [facility] OR;  Service: General;  Laterality: N/A;   WOUND EXPLORATION  03/18/2019   abdominal   WOUND EXPLORATION N/A 03/18/2019   Procedure: ABDOMINAL WOUND EXPLORATION;  Surgeon: Manus Rudd, MD;  Location: [facility] OR;  Service: General;  Laterality: N/A;    Review of systems negative except as noted in HPI / PMHx or noted below:  Review of Systems   Constitutional: Negative.   HENT: Negative.    Eyes: Negative.   Respiratory: Negative.    Cardiovascular: Negative.   Gastrointestinal: Negative.   Genitourinary: Negative.   Musculoskeletal: Negative.   Skin: Negative.   Neurological: Negative.   Endo/Heme/Allergies: Negative.   Psychiatric/Behavioral: Negative.       Objective:   Vitals:   10/03/22 0948  BP: 130/66  Pulse: 62  Resp: 18  Temp: 98.5 F (36.9 C)  SpO2: 96%   Height: 5' 3.39" (161 cm)  Weight: 288 lb 4.8 oz (130.8 kg)   Physical Exam Constitutional:      Appearance: She is not diaphoretic.  HENT:     Head: Normocephalic.     Right Ear: Tympanic membrane, ear canal and external ear normal.     Left Ear: Tympanic membrane, ear canal and external ear normal.     Nose: Nose normal. No mucosal edema or rhinorrhea.     Mouth/Throat:     Pharynx: Uvula midline. No oropharyngeal exudate.  Eyes:     Conjunctiva/sclera: Conjunctivae normal.  Neck:     Thyroid: No thyromegaly.     Trachea: Trachea normal. No tracheal tenderness or tracheal deviation.  Cardiovascular:     Rate and Rhythm: Normal rate and regular rhythm.     Heart sounds: Normal heart sounds, S1 normal and S2 normal. No murmur heard. Pulmonary:     Effort: No respiratory distress.     Breath sounds: Normal breath sounds. No stridor. No wheezing or rales.  Lymphadenopathy:     Head:     Right side of head: No tonsillar adenopathy.     Left side of head: No tonsillar adenopathy.     Cervical: No cervical adenopathy.  Skin:    Findings: No erythema or rash.     Nails: There is no clubbing.  Neurological:     Mental Status: She is alert.     Diagnostics: Spirometry was performed and demonstrated an FEV1 of 1.92 at 80 % of predicted.  Assessment and Plan:   1. Asthma, severe persistent, well-controlled   2. Other allergic  rhinitis   3. LPRD (laryngopharyngeal reflux disease)   4. Asthma with acute exacerbation, unspecified asthma  severity, unspecified whether persistent     1. Continue Xolair (Epi-Pen)  2.  If needed:   A.  Pro-air HFA  B.  Antihistamine   C.  Famotidine 20 mg daily  D.  Dulera 200 -2 inhalations twice a day  E.  montelukast 10 mg daily F.  Flonase 1 spray each nostril 1-7 times per week  3. Return to clinic in 6 months or earlier if problem  Oris appears to be doing quite well with as needed medications including her Dulera and montelukast and Flonase and famotidine to address respiratory tract inflammation and reflux.  It is the omalizumab administration that is giving rise to very good control regarding her atopic respiratory disease and she will continue on this plan.  I will see her back in this clinic in 6 months or earlier if there is a problem.   Laurette Schimke, MD Allergy / Immunology [facility]

## 2022-10-03 NOTE — Patient Instructions (Signed)
  1. Continue Xolair (Epi-Pen)  2.  If needed:   A.  Pro-air HFA  B.  Antihistamine   C.  Famotidine 20 mg daily  D.  Dulera 200 -2 inhalations twice a day  E.  montelukast 10 mg daily F.  Flonase 1 spray each nostril 1-7 times per week  3. Return to clinic in 6 months or earlier if problem

## 2022-10-04 ENCOUNTER — Encounter: Payer: Self-pay | Admitting: Allergy and Immunology

## 2022-10-11 ENCOUNTER — Ambulatory Visit: Payer: Medicaid Other

## 2022-10-12 ENCOUNTER — Ambulatory Visit (INDEPENDENT_AMBULATORY_CARE_PROVIDER_SITE_OTHER): Payer: Medicaid Other

## 2022-10-12 DIAGNOSIS — J4550 Severe persistent asthma, uncomplicated: Secondary | ICD-10-CM | POA: Diagnosis not present

## 2022-10-30 ENCOUNTER — Ambulatory Visit: Payer: Medicaid Other | Admitting: Obstetrics and Gynecology

## 2022-10-31 ENCOUNTER — Other Ambulatory Visit: Payer: Medicaid Other | Admitting: Obstetrics and Gynecology

## 2022-10-31 NOTE — Patient Instructions (Signed)
Hi Kelsey Bullock, sorry I missed you today, I hope you are doing okay- as a part of your Medicaid benefit, you are eligible for care management and care coordination services at no cost or copay. I was unable to reach you by phone today but would be happy to help you with your health related needs. Please feel free to call me at [phone].  A member of the Managed Medicaid care management team will reach out to you again over the next 30 business days.   Kathi Der RN, BSN [place]  [employer] - Managed Medicaid High Risk [phone]

## 2022-10-31 NOTE — Patient Outreach (Signed)
  Medicaid Managed Care   Unsuccessful Attempt Note   10/31/2022 Name: Kelsey Bullock MRN: [id number] DOB: 03-18-1961  Referred by: Nestor Ramp, MD Reason for referral : High Risk Managed Medicaid (Unsuccessful telephone outreach)  An unsuccessful telephone outreach was attempted today. The patient was referred to the case management team for assistance with care management and care coordination.    Follow Up Plan: The Managed Medicaid care management team will reach out to the patient again over the next 30 business  days. and The  Patient has been provided with contact information for the Managed Medicaid care management team and has been advised to call with any health related questions or concerns.   Kathi Der RN, BSN [place]  [employer] - Managed Medicaid High Risk [phone]

## 2022-11-10 ENCOUNTER — Ambulatory Visit (INDEPENDENT_AMBULATORY_CARE_PROVIDER_SITE_OTHER): Payer: Medicaid Other | Admitting: *Deleted

## 2022-11-10 DIAGNOSIS — J4550 Severe persistent asthma, uncomplicated: Secondary | ICD-10-CM | POA: Diagnosis not present

## 2022-11-29 ENCOUNTER — Telehealth: Payer: Self-pay

## 2022-11-29 ENCOUNTER — Ambulatory Visit (INDEPENDENT_AMBULATORY_CARE_PROVIDER_SITE_OTHER): Payer: Medicaid Other | Admitting: Internal Medicine

## 2022-11-29 ENCOUNTER — Other Ambulatory Visit: Payer: Self-pay

## 2022-11-29 ENCOUNTER — Other Ambulatory Visit: Payer: Self-pay | Admitting: Internal Medicine

## 2022-11-29 ENCOUNTER — Encounter: Payer: Self-pay | Admitting: Internal Medicine

## 2022-11-29 VITALS — BP 118/70 | HR 78 | Temp 98.4°F | Resp 16 | Wt 294.9 lb

## 2022-11-29 DIAGNOSIS — J0110 Acute frontal sinusitis, unspecified: Secondary | ICD-10-CM | POA: Diagnosis not present

## 2022-11-29 DIAGNOSIS — K219 Gastro-esophageal reflux disease without esophagitis: Secondary | ICD-10-CM | POA: Diagnosis not present

## 2022-11-29 DIAGNOSIS — J4551 Severe persistent asthma with (acute) exacerbation: Secondary | ICD-10-CM

## 2022-11-29 MED ORDER — EPINEPHRINE 0.3 MG/0.3ML IJ SOAJ: 0.3000 mg | INTRAMUSCULAR | 1 refills | Status: DC | PRN

## 2022-11-29 MED ORDER — DULERA 200-5 MCG/ACT IN AERO: 2.0000 | INHALATION_SPRAY | Freq: Two times a day (BID) | RESPIRATORY_TRACT | 1 refills | Status: DC

## 2022-11-29 MED ORDER — MONTELUKAST SODIUM 10 MG PO TABS: 10.0000 mg | ORAL_TABLET | Freq: Every evening | ORAL | 1 refills | Status: DC

## 2022-11-29 MED ORDER — ALBUTEROL SULFATE HFA 108 (90 BASE) MCG/ACT IN AERS: 2.0000 | INHALATION_SPRAY | RESPIRATORY_TRACT | 1 refills | Status: DC | PRN

## 2022-11-29 MED ORDER — ALBUTEROL SULFATE (2.5 MG/3ML) 0.083% IN NEBU: 2.5000 mg | INHALATION_SOLUTION | RESPIRATORY_TRACT | 1 refills | Status: DC | PRN

## 2022-11-29 MED ORDER — SULFAMETHOXAZOLE-TRIMETHOPRIM 800-160 MG PO TABS: 1.0000 | ORAL_TABLET | Freq: Two times a day (BID) | ORAL | 0 refills | Status: AC

## 2022-11-29 MED ORDER — IPRATROPIUM BROMIDE 0.06 % NA SOLN: 2.0000 | Freq: Four times a day (QID) | NASAL | 2 refills | Status: DC

## 2022-11-29 MED ORDER — DOXYCYCLINE MONOHYDRATE 100 MG PO TABS: 100.0000 mg | ORAL_TABLET | Freq: Two times a day (BID) | ORAL | 0 refills | Status: DC

## 2022-11-29 MED ORDER — DOXYCYCLINE HYCLATE 50 MG PO TBEC: 100.0000 mg | DELAYED_RELEASE_TABLET | Freq: Two times a day (BID) | ORAL | 0 refills | Status: DC

## 2022-11-29 MED ORDER — METHYLPREDNISOLONE ACETATE 40 MG/ML IJ SUSP
40.0000 mg | Freq: Once | INTRAMUSCULAR | Status: AC
  Administered 2022-11-29: 40 mg via INTRAMUSCULAR

## 2022-11-29 MED ORDER — PREDNISONE 20 MG PO TABS: 40.0000 mg | ORAL_TABLET | Freq: Every day | ORAL | 0 refills | Status: AC

## 2022-11-29 NOTE — Telephone Encounter (Signed)
Hi Kelsey Bullock...pharmacy called [place] earlier and said doxycycline hyclate was covered so we sent that.  Is that not true? Let me know so I can send in a different antibiotic if needed.  Her epipen was sent in April, so I did not refill.  Yes, you can send in her ipatropium.

## 2022-11-29 NOTE — Telephone Encounter (Signed)
Yes please send in another antibiotic, while I was on the phone they ran multiple versions of the doxy all were not covered.

## 2022-11-29 NOTE — Telephone Encounter (Signed)
Thank you patient has been notified and plans to pick up medications soon.

## 2022-11-29 NOTE — Progress Notes (Signed)
Sending in Bactrim 800 mg BID x 10 days for sinusitis. Doxycycline not covered. Penicillin allergic.

## 2022-11-29 NOTE — Telephone Encounter (Signed)
Hi Diandra-I sent in Bactrim 800 mg BID x 10 days and her ipatropium. Do you mind calling to let her know? Thanks

## 2022-11-29 NOTE — Progress Notes (Signed)
FOLLOW UP Date of Service/Encounter:  11/29/22   Subjective:  ISABELLAH SOBOCINSKI (DOB: 1961-04-12) is a 62 y.o. female who [facility]/2022 in re-evaluation of the following: acute visit for congestion History obtained from: chart review and patient.  For Review, LV was on 10/03/22  with Dr. Lucie Leather seen for routine follow-up for asthma, allergic rhinitis, LPR . On xolair for asthma.   --------------------------------------------------- Today presents for acute visit for congestion. She has been sick for the last week.  Symptoms include watery rhinorrhea, sore throat, not sure if she's had a fever.  Mucus is now dark yellow.  She is having headaches and feels her head is under water. Lots of frontal sinus pressure.  She had a lot of drainage going down her throat. Denies body aches. She has been around her daughter visiting from [place].  Daughter does work in a hospital, but did report some diarrhea yesterday.  Now Ms. Bigos is reporting diarrhea as of today. No known sick contacts.  Her taste and smell have been affected since Covid, but smell and taste have worsened acutely during this illness. She has not needed steroids or antibiotics since her last visit.  She continues on Xolair.  She is next due on the 4th. Compliant with dosing. She did use her nebulizer the day before yesterday and her montelukast. She did use her albuterol inhaler as well which helped at the time.   She feels the way she did last time she had covid without the body aches.  She has had paxlovid before. She has penicillin allergy.  Allergies as of 11/29/2022       Reactions   Amoxicillin Anaphylaxis, Swelling   Etodolac Anaphylaxis, Shortness Of Breath    lips swelled  lips swelled  lips swelled   Penicillins Anaphylaxis, Swelling, Rash   DID THE REACTION INVOLVE: Swelling of the face/tongue/throat, SOB, or low BP? Yes Sudden or severe rash/hives, skin  peeling, or the inside of the mouth or nose? No Did it require medical treatment? No When did it last happen?      20 years ago If all above answers are "NO", may proceed with cephalosporin use.   Latex Rash, Hives   Pantoprazole Sodium Hives   unspecified REACTION: unspecified   Wound Dressing Adhesive Rash   Ampicillin Swelling   Augmentin [amoxicillin-pot Clavulanate] Swelling   Diclofenac Sodium    Unknown reaction    Pantoprazole Sodium    unspecified   Adhesive [tape] Rash        Medication List        Accurate as of November 29, 2022  1:03 PM. If you have any questions, ask your nurse or doctor.          albuterol 108 (90 Base) MCG/ACT inhaler Commonly known as: VENTOLIN HFA Inhale 2 puffs into the lungs every 4 (four) hours as needed.   albuterol (2.5 MG/3ML) 0.083% nebulizer solution Commonly known as: PROVENTIL Take 3 mLs (2.5 mg total) by nebulization every 4 (four) hours as needed. USE 1 VIAL VIA NEBULIZER EVERY 4 TO 6 HOURS AS NEEDED FOR COUGH OR WHEEZING   ascorbic acid 500 MG tablet Commonly known as: VITAMIN C Take 1,000 mg by mouth daily.   atorvastatin 20 MG tablet Commonly known as: LIPITOR Take 1 tablet (20 mg total) by mouth daily.   cetirizine 10 MG tablet Commonly known as: ZYRTEC Take 1 tablet (10 mg total) by mouth 2 (two)  times daily as needed for allergies (Can take an extra dose for breakthrough symptoms).   clonazePAM 1 MG tablet Commonly known as: KLONOPIN Take 1 mg by mouth as needed.   cyclobenzaprine 10 MG tablet Commonly known as: FLEXERIL Take 1 tablet (10 mg total) by mouth 3 (three) times daily as needed for muscle spasms.   diclofenac Sodium 1 % Gel Commonly known as: VOLTAREN Apply 4 g topically 4 (four) times daily.   doxycycline 100 MG tablet Commonly known as: ADOXA Take 1 tablet (100 mg total) by mouth 2 (two) times daily for 10 days. Started by: Verlee Monte, MD   Elwin Sleight 200-5 MCG/ACT Aero Generic drug:  mometasone-formoterol Inhale 2 puffs into the lungs in the morning and at bedtime. Take twice daily for 1-2 weeks with respiratory illness or asthma flare.   EpiPen 2-Pak 0.3 mg/0.3 mL Soaj injection Generic drug: EPINEPHrine Inject 0.3 mg into the muscle as needed for anaphylaxis.   famotidine 20 MG tablet Commonly known as: PEPCID Take 1 tablet (20 mg total) by mouth in the morning.   Fish Oil 1200 MG Caps Take 1 capsule by mouth.   fluticasone 50 MCG/ACT nasal spray Commonly known as: FLONASE Place 1 spray into both nostrils daily as needed for allergies or rhinitis.   furosemide 10 MG/ML solution Commonly known as: LASIX Take 10 mg by mouth as needed.   ipratropium 0.06 % nasal spray Commonly known as: ATROVENT Place 2 sprays into both nostrils 4 (four) times daily. USE 2 SPRAYS INTO EACH NOSTRIL EVERY 6 HOURS AS NEEDED FOR TO DRY UP NOSE.   ketoconazole 2 % shampoo Commonly known as: NIZORAL Apply 1 Application topically 2 (two) times a week.   linaclotide 72 MCG capsule Commonly known as: LINZESS Take 2 capsules (144 mcg total) by mouth daily before breakfast.   MG Plus Protein 133 MG Tabs Take 1 tablet by mouth daily.   montelukast 10 MG tablet Commonly known as: SINGULAIR Take 1 tablet (10 mg total) by mouth at bedtime. Take one tablet by mouth at bedtime   mupirocin ointment 2 % Commonly known as: BACTROBAN Apply 1 Application topically 2 (two) times daily.   Nebulizer Mask Adult Misc 1 Device by Does not apply route as needed.   nystatin powder Commonly known as: nystatin APPLY TOPICALLY TO THE AFFECTED AREA THREE TIMES DAILY   Olopatadine HCl 0.7 % Soln Apply 1 drop to eye daily.   omalizumab 150 MG/ML prefilled syringe Commonly known as: Xolair Inject 300 mg into the skin every 28 (twenty-eight) days.   predniSONE 20 MG tablet Commonly known as: DELTASONE Take 2 tablets (40 mg total) by mouth daily with breakfast for 4 days. Start taking on:  November 30, 2022 Started by: Verlee Monte, MD   PROBIOTIC DAILY PO Take 1 capsule by mouth as needed.   simvastatin 80 MG tablet Commonly known as: ZOCOR Take 80 mg by mouth at bedtime.   terconazole 0.4 % vaginal cream Commonly known as: TERAZOL 7 Place 1 applicator vaginally at bedtime.   triamcinolone 0.025 % ointment Commonly known as: KENALOG Apply topically 2 (two) times daily.   Vitamin E 100 units Tabs Take 1 capsule by mouth daily.       Past Medical History:  Diagnosis Date   Allergic rhinitis    Anemia    Anxiety    Arthritis    knees and back   Asthma    Complication of anesthesia    Congestive  heart failure (CHF) (HCC)    Depression    Gall stone    GERD (gastroesophageal reflux disease)    Headache    migraines, stress headaches   History of kidney stones    Hypertension    Hyperthyroidism    during pregnancy   Pneumonia    PONV (postoperative nausea and vomiting)    Recurrent upper respiratory infection (URI)    Sleep apnea    can't use Cpap   Past Surgical History:  Procedure Laterality Date   ADENOIDECTOMY     APPLICATION OF WOUND VAC  03/18/2019   APPLICATION OF WOUND VAC N/A 03/18/2019   Procedure: Application Of Wound Vac;  Surgeon: Manus Rudd, MD;  [facility] OR;  Service: General;  Laterality: N/A;   BIOPSY  05/27/2018   Procedure: BIOPSY;  Surgeon: Napoleon Form, MD;  Location: [facility];  Service: Endoscopy;;   CESAREAN SECTION     COLONOSCOPY WITH PROPOFOL N/A 05/27/2018   Procedure: COLONOSCOPY WITH PROPOFOL;  Surgeon: Napoleon Form, MD;  Location: [facility];  Service: Endoscopy;  Laterality: N/A;   CYST EXCISION     EXPLORATORY LAPAROTOMY  02/03/2019   INGUINAL HERNIA REPAIR N/A 02/03/2019   Procedure: EXPLORATORY LAPAROTOMY WITH SMALL BOWEL RESECTION AND PRIMARY CLOSURE OF VENTRAL HERNIA;  Surgeon: Manus Rudd, MD;  Location: [facility] OR;  Service: General;  Laterality: N/A;   INSERTION OF MESH N/A 11/25/2020    Procedure: INSERTION OF MESH;  Surgeon: Manus Rudd, MD;  Location: [facility] OR;  Service: General;  Laterality: N/A;   OVARIAN CYST REMOVAL     POLYPECTOMY  05/27/2018   Procedure: POLYPECTOMY;  Surgeon: Napoleon Form, MD;  Location: [facility];  Service: Endoscopy;;   removal of gallstones     TONSILLECTOMY     TYMPANOSTOMY TUBE PLACEMENT     VENTRAL HERNIA REPAIR N/A 11/25/2020   Procedure: RECURRENT VENTRAL HERNIA REPAIR WITH MESH;  Surgeon: Manus Rudd, MD;  Location: [facility] OR;  Service: General;  Laterality: N/A;   WOUND EXPLORATION  03/18/2019   abdominal   WOUND EXPLORATION N/A 03/18/2019   Procedure: ABDOMINAL WOUND EXPLORATION;  Surgeon: Manus Rudd, MD;  Location: [facility] OR;  Service: General;  Laterality: N/A;   Otherwise, there have been no changes to her past medical history, surgical history, family history, or social history.  ROS: All others negative except as noted per HPI.   Objective:  BP 118/70   Pulse 78   Temp 98.4 F (36.9 C) (Temporal)   Resp 16   Wt 294 lb 14.4 oz (133.8 kg)   LMP 02/03/2019 Comment: Tubal Ligation  SpO2 94%   BMI 51.60 kg/m  Body mass index is 51.6 kg/m. Physical Exam: General Appearance:  Alert, cooperative, no distress, appears stated age  Head:  Normocephalic, without obvious abnormality, atraumatic  Eyes:  Conjunctiva clear, EOM's intact  Nose: Nares normal, hypertrophic turbinates  Throat: Lips, tongue normal; teeth and gums normal,  erythematous posterior oropharnxy  Neck: Supple, symmetrical  Lungs:   clear to auscultation bilaterally, Respirations unlabored, no coughing  Heart:  regular rate and rhythm and no murmur, Appears well perfused  Extremities: No edema  Skin: Skin color, texture, turgor normal and no rashes or lesions on visualized portions of skin  Neurologic: No gross deficits   Rapid covid testing in office: negtaive  Assessment/Plan  Covid rapid testing negative   1. Continue Xolair (Epi-Pen) 2.  Start doxycycline 100 mg twice daily for 10 days 3. 40 mg  IM depomedrol given today in clinic 4. Tomorrow start prednisone 40 mg daily and continue for 4 days 5. Start Dulera 2 puffs twice daily and continue for 2 weeks or until symptoms resolved 6. Start albuterol either via nebulizer or 4 puffs via inhaler while awake for the next 3-4 days   If needed:   A.  Pro-air HFA  B.  Antihistamine   C.  Famotidine 20 mg daily  D.  Dulera 200 -2 inhalations twice a day  E.  montelukast 10 mg daily F.  Flonase 1 spray each nostril 1-7 times per week  3. Return to clinic in 6 months or earlier if problem  Tonny Bollman, MD  [facility]

## 2022-11-29 NOTE — Telephone Encounter (Signed)
Patient called back due to not being able to pick up the doxycycline. I called the pharmacy to see which version needed to be sent in per her insurance and unfortunately no version of the doxycycline is covered by her insurance plan for Part D.   Epi pen has been sent in and is covered by her insurance. Patient would like ipratropium nasal spray sent in toady please advise.

## 2022-11-29 NOTE — Telephone Encounter (Signed)
*  Asthma/Allergy  PA request received for Doxycycline Monohydrate 100MG  tablets  PA submitted to [employer] via CMM and is pending additional questions/determination  Key: [id number]

## 2022-11-29 NOTE — Patient Instructions (Addendum)
1. Continue Xolair (Epi-Pen) 2. Start doxycycline 100 mg twice daily for 10 days 3. 40 mg IM depomedrol given today in clinic 4. Tomorrow start prednisone 40 mg daily and continue for 4 days 5. Start Dulera 2 puffs twice daily and continue for 2 weeks or until symptoms resolved 6. Start albuterol either via nebulizer or 4 puffs via inhaler while awake for the next 3-4 days   If needed:   A.  Pro-air HFA  B.  Antihistamine   C.  Famotidine 20 mg daily  D.  Dulera 200 -2 inhalations twice a day  E.  montelukast 10 mg daily F.  Flonase 1 spray each nostril 1-7 times per week  3. Return to clinic in 6 months or earlier if problem

## 2022-11-29 NOTE — Addendum Note (Signed)
Addended by: Orson Aloe on: 11/29/2022 03:22 PM   Modules accepted: Orders

## 2022-11-30 ENCOUNTER — Other Ambulatory Visit (HOSPITAL_COMMUNITY): Payer: Self-pay

## 2022-11-30 DIAGNOSIS — F411 Generalized anxiety disorder: Secondary | ICD-10-CM | POA: Diagnosis not present

## 2022-11-30 DIAGNOSIS — F32A Depression, unspecified: Secondary | ICD-10-CM | POA: Diagnosis not present

## 2022-11-30 NOTE — Telephone Encounter (Signed)
Alternative antibiotic has been sent in. Patient has been made aware.

## 2022-11-30 NOTE — Telephone Encounter (Signed)
Thanks

## 2022-11-30 NOTE — Telephone Encounter (Signed)
Patient Advocate Encounter  Received a fax from [facility] regarding Prior Authorization for Doxycycline Monohydrate 100MG  tablets.   Key: [id number]  Authorization has been DENIED due to

## 2022-12-01 ENCOUNTER — Other Ambulatory Visit: Payer: Medicaid Other | Admitting: Obstetrics and Gynecology

## 2022-12-01 ENCOUNTER — Encounter: Payer: Self-pay | Admitting: Obstetrics and Gynecology

## 2022-12-01 NOTE — Patient Instructions (Signed)
Hey Ms. Sulak, so nice to speak with you this morning, I hope you feel better.  Ms. Immel was given information about Medicaid Managed Care team care coordination services as a part of their [facility] Medicaid benefit. LUDDIE GERHOLD verbally consented to engagement with the [facility] Managed Care team.   If you are experiencing a medical emergency, please call 911 or report to your local emergency department or urgent care.   If you have a non-emergency medical problem during routine business hours, please contact your provider's office and ask to speak with a nurse.   For questions related to your [facility], please call: [phone] or visit the homepage here: [email or web address]  If you would like to schedule transportation through your [facility], please call the following number at least 2 days in advance of your appointment: [phone]   Rides for urgent appointments can also be made after hours by calling Member Services.  Call the Behavioral Health Crisis Line at [phone], at any time, 24 hours a day, 7 days a week. If you are in danger or need immediate medical attention call 911.  If you would like help to quit smoking, call 1-800-QUIT-NOW ((639)824-8777) OR Espaol: 1-855-Djelo-Ya (7-425-956-3875) o para ms informacin haga clic aqu or Text READY to 643-329 to register via text  Ms. Arlis Porta - following are the goals we discussed in your visit today:   Goals Addressed    Timeframe:  Long-Range Goal Priority:  High Start Date:   02/27/22                          Expected End Date:ongoing                       Follow Up Date 01/01/23:  Seen by Allergy and Asthma 6/26, has appt with PCP 7/3   - practice safe sex - schedule appointment for flu shot - schedule appointment for vaccines needed due to my age or health - schedule  recommended health tests (blood work, mammogram, colonoscopy, pap test) - schedule and keep appointment for annual check-up    Why is this important?   Screening tests can find diseases early when they are easier to treat.  Your doctor or nurse will talk with you about which tests are important for you.  Getting shots for common diseases like the flu and shingles will help prevent them.   12/01/22: Seen by Allergy and Asthma 6/26, has appt with PCP 7/3  Patient verbalizes understanding of instructions and care plan provided today and agrees to view in MyChart. Active MyChart status and patient understanding of how to access instructions and care plan via MyChart confirmed with patient.     The Managed Medicaid care management team will reach out to the patient again over the next 30 business  days.  The  Patient has been provided with contact information for the Managed Medicaid care management team and has been advised to call with any health related questions or concerns.   Kathi Der RN, BSN [place]  [facility] Care Management Coordinator - Managed Medicaid High Risk [phone]   Following is a copy of your plan of care:  Care Plan : RN Care Manager Plan of Care  Updates made by Danie Chandler, RN since 12/01/2022 12:00 AM     Problem: Health Promotion or Disease Self-Management (General Plan of Care)  Long-Range Goal: Chronic Disease Management and Care Coordination Needs   Start Date: 02/27/2022  Expected End Date: 03/03/2023  Priority: High  Note:   Current Barriers:  Knowledge Deficits related to plan of care for management of CHF, GERD, asthma, osteoarthritis, constipation, sleep apnea Care Coordination needs related to financial constraints Chronic Disease Management support and education needs related to CHF, GERD, Asthma,osteoarthritis, constipation, sleep apnea. 12/01/22:  patient with sinus infection-Abx prescribed by provider.  Continues therapy  services with Dr. Sandria Manly.    RNCM Clinical Goal(s):  Patient will verbalize understanding of plan for management of CHF, GERD, asthma, osteoarthritis, constipation, sleep apnea as evidenced by patient report verbalize basic understanding of  CHF, GERD, asthma, osteoarthritis, constipation, sleep apnea disease process and self health management plan as evidenced by patient report take all medications exactly as prescribed and will call provider for medication related questions as evidenced by patient report demonstrate understanding of rationale for each prescribed medication as evidenced by patient report attend all scheduled medical appointments as evidenced by patient report continue to work with RN Care Manager to address care management and care coordination needs related to  CHF, GERD, asthma, osteoarthritis, constipation, sleep apnea as evidenced by adherence to CM Team Scheduled appointments through collaboration with RN Care manager, provider, and care team.   Interventions: Inter-disciplinary care team collaboration (see longitudinal plan of care) Evaluation of current treatment plan related to  self management and patient's adherence to plan as established by provider Pharmacy referral for medication review Collaborated with Pharmacy BSW for food and legal resources-completed Collaborated with BSW  Asthma: (Status:New goal.) Long Term Goal Provided patient with basic written and verbal Asthma education on self care/management/and exacerbation prevention Advised patient to track and manage Asthma triggers Provided education about and advised patient to utilize infection prevention strategies to reduce risk of respiratory infection Discussed the importance of adequate rest and management of fatigue with Asthma Assessed social determinant of health barriers   CAD Interventions: (Status:  New goal.) Long Term Goal Assessed understanding of CAD diagnosis Medications reviewed including  medications utilized in CAD treatment plan Provided education on importance of blood pressure control in management of CAD Provided education on Importance of limiting foods high in cholesterol Counseled on importance of regular laboratory monitoring as prescribed Reviewed Importance of taking all medications as prescribed Reviewed Importance of attending all scheduled provider appointments Advised to report any changes in symptoms or exercise tolerance Assessed social determinant of health barriers  Heart Failure Interventions:  (Status:  New goal.) Long Term Goal Basic overview and discussion of pathophysiology of Heart Failure reviewed Discussed the importance of keeping all appointments with provider Assessed social determinant of health barriers     (Status:  New goal.)  Long Term Goal Evaluation of current treatment plan related to  CHF, GERD, asthma, osteoarthritis, constipation, sleep apnea , Transportation and Housing barriers self-management and patient's adherence to plan as established by provider. Discussed plans with patient for ongoing care management follow up and provided patient with direct contact information for care management team Evaluation of current treatment plan related to chronic health conditions  and patient's adherence to plan as established by provider Advised patient to provide appropriate vaccination information to provider or CM team member at next visit Advised patient to contact [employer] as a resource Reviewed medications with patient  Provided patient with information about [employer] and transportation Reviewed scheduled/upcoming provider appointments Discussed plans with patient for ongoing care management follow up and provided patient  with direct contact information for care management team 03/24/22:  Patient states she contacted [employer] and is currently working with them.  SDOH Barriers (Status:  New goal.) Long Term Goal Patient interviewed and  SDOH assessment performed        Patient interviewed and appropriate assessments performed Provided patient with information about [employer] and [facility] Transportation services. Discussed plans with patient for ongoing care management follow up and provided patient with direct contact information for care management team  Patient Goals/Self-Care Activities: Take all medications as prescribed Attend all scheduled provider appointments Call pharmacy for medication refills 3-7 days in advance of running out of medications Perform all self care activities independently  Perform IADL's (shopping, preparing meals, housekeeping, managing finances) independently Call provider office for new concerns or questions   Follow Up Plan:  The patient has been provided with contact information for the care management team and has been advised to call with any health related questions or concerns.  The care management team will reach out to the patient again over the next 60  business  days.

## 2022-12-01 NOTE — Patient Outreach (Signed)
Medicaid Managed Care   Nurse Care Manager Note  12/01/2022 Name:  Kelsey Bullock MRN:  [id number] DOB:  [birth year]  [name] is an 62 y.o. year old female who is a primary Kelsey Bullock of [name], DO.  The [facility] Managed Care Coordination team was consulted for assistance with:    Chronic healthcare management needs, CHF, GERD, asthma, rhinitis, OSA, osteoarthritis, LBP, anxiety/depression  Kelsey Bullock was given information about Medicaid Managed Care Coordination team services today. [name] Kelsey Bullock agreed to services and verbal consent obtained.  Engaged with Kelsey Bullock by telephone for follow up visit in response to provider referral for case management and/or care coordination services.   Assessments/Interventions:  Review of past medical history, allergies, medications, health status, including review of consultants reports, laboratory and other test data, was performed as part of comprehensive evaluation and provision of chronic care management services.  SDOH (Social Determinants of Health) assessments and interventions performed: SDOH Interventions    Flowsheet Row Kelsey Bullock Outreach Telephone from 12/01/2022 in [place] POPULATION HEALTH DEPARTMENT Kelsey Bullock Outreach Telephone from 08/30/2022 in [place] POPULATION HEALTH DEPARTMENT Kelsey Bullock Outreach Telephone from 08/01/2022 in [place] POPULATION HEALTH DEPARTMENT Kelsey Bullock Outreach Telephone from 07/06/2022 in [place] POPULATION HEALTH DEPARTMENT Kelsey Bullock Outreach Telephone from 06/07/2022 in [place] POPULATION HEALTH DEPARTMENT Kelsey Bullock Outreach Telephone from 04/03/2022 in [facility] Care Coordination  SDOH Interventions        Food Insecurity Interventions --  [BSW gave resources, sister also helps] -- -- -- -- Other (Comment)  [goes to food pantry, BSW referral]  Housing Interventions Other (Comment)  [Kelsey Bullock given resources, discussed UHC MM? pest control services] -- -- --  -- Other (Comment)  [BSW referral, legal aid resource given]  Transportation Interventions -- Intervention Not Indicated -- -- -- --  Utilities Interventions -- -- -- -- Intervention Not Indicated --  Alcohol Usage Interventions -- -- -- -- Intervention Not Indicated (Score <7) --  Financial Strain Interventions -- -- -- Other (Comment)  [Kelsey Bullock provided resources] -- --  Physical Activity Interventions -- -- Intervention Not Indicated -- -- --  Stress Interventions -- -- -- Other (Comment)  [Kelsey Bullock has therapist/counselor and Psychiatrist that she sees] -- --  Social Connections Interventions -- -- Intervention Not Indicated -- -- --     Care Plan  Allergies  Allergen Reactions   Amoxicillin Anaphylaxis and Swelling   Etodolac Anaphylaxis and Shortness Of Breath     lips swelled  lips swelled  lips swelled   Penicillins Anaphylaxis, Swelling and Rash    DID THE REACTION INVOLVE: Swelling of the face/tongue/throat, SOB, or low BP? Yes Sudden or severe rash/hives, skin peeling, or the inside of the mouth or nose? No Did it require medical treatment? No When did it last happen?      20 years ago If all above answers are "NO", may proceed with cephalosporin use.    Latex Rash and Hives   Pantoprazole Sodium Hives    unspecified REACTION: unspecified   Wound Dressing Adhesive Rash   Ampicillin Swelling   Augmentin [Amoxicillin-Pot Clavulanate] Swelling   Diclofenac Sodium     Unknown reaction    Pantoprazole Sodium     unspecified   Adhesive [Tape] Rash    Medications Reviewed Today     Reviewed by Danie Chandler, RN (Registered Nurse) on 12/01/22 at 1042  Med List Status: <None>   Medication Order Taking? Sig Documenting Provider Last Dose Status Informant  albuterol (  PROVENTIL) (2.5 MG/3ML) 0.083% nebulizer solution [id number]  Take 3 mLs (2.5 mg total) by nebulization every 4 (four) hours as needed. USE 1 VIAL VIA NEBULIZER EVERY 4 TO 6 HOURS AS NEEDED FOR COUGH OR  WHEEZING Verlee Monte, MD  Active   albuterol (VENTOLIN HFA) 108 (90 Base) MCG/ACT inhaler [id number]  Inhale 2 puffs into the lungs every 4 (four) hours as needed. Verlee Monte, MD  Active   ascorbic acid (VITAMIN C) 500 MG tablet [id number] No Take 1,000 mg by mouth daily. [provider] Taking Active   atorvastatin (LIPITOR) 20 MG tablet [id number] No Take 1 tablet (20 mg total) by mouth daily. Nestor Ramp, MD Taking Active   cetirizine (ZYRTEC) 10 MG tablet [id number] No Take 1 tablet (10 mg total) by mouth 2 (two) times daily as needed for allergies (Can take an extra dose for breakthrough symptoms). Kozlow, Alvira Philips, MD Taking Active            Med Note [place], Desma Paganini Apr 12, 2022  1:58 PM) Using PRN  clonazePAM (KLONOPIN) 1 MG tablet [id number] No Take 1 mg by mouth as needed. [provider] Taking Active   cyclobenzaprine (FLEXERIL) 10 MG tablet [id number] No Take 1 tablet (10 mg total) by mouth 3 (three) times daily as needed for muscle spasms. Moses Manners, MD Taking Active   diclofenac Sodium (VOLTAREN) 1 % GEL [id number] No Apply 4 g topically 4 (four) times daily. Littie Deeds, MD Taking Active   EPINEPHrine 0.3 mg/0.3 mL IJ SOAJ injection [id number]  Inject 0.3 mg into the muscle as needed for anaphylaxis. Verlee Monte, MD  Active   EPIPEN 2-PAK 0.3 MG/0.3ML SOAJ injection [id number] No Inject 0.3 mg into the muscle as needed for anaphylaxis. Kozlow, Alvira Philips, MD Taking Active   famotidine (PEPCID) 20 MG tablet [id number] No Take 1 tablet (20 mg total) by mouth in the morning. Kozlow, Alvira Philips, MD Taking Active   fluticasone [facility]) 50 MCG/ACT nasal spray [id number] No Place 1 spray into both nostrils daily as needed for allergies or rhinitis. Kozlow, Alvira Philips, MD Taking Active   furosemide (LASIX) 10 MG/ML solution [id number] No Take 10 mg by mouth as needed. [provider] Taking Active   ipratropium (ATROVENT) 0.06 % nasal spray [id number]  Place 2  sprays into both nostrils 4 (four) times daily. USE 2 SPRAYS INTO EACH NOSTRIL EVERY 6 HOURS AS NEEDED FOR TO DRY UP NOSE. Verlee Monte, MD  Active   ketoconazole (NIZORAL) 2 % shampoo [id number] No Apply 1 Application topically 2 (two) times a week. Moses Manners, MD Taking Active   linaclotide [name]) 72 MCG capsule [id number] No Take 2 capsules (144 mcg total) by mouth daily before breakfast. Moses Manners, MD Taking Active   mometasone-formoterol [facility]) 200-5 MCG/ACT Sandrea Matte [id number]  Inhale 2 puffs into the lungs in the morning and at bedtime. Take twice daily for 1-2 weeks with respiratory illness or asthma flare. Verlee Monte, MD  Active   montelukast (SINGULAIR) 10 MG tablet [id number]  Take 1 tablet (10 mg total) by mouth at bedtime. Take one tablet by mouth at bedtime Verlee Monte, MD  Active   mupirocin ointment (BACTROBAN) 2 % [id number] No Apply 1 Application topically 2 (two) times daily. Moses Manners, MD Taking Active   nystatin powder [id number] No APPLY TOPICALLY TO THE AFFECTED AREA THREE TIMES DAILY Hensel, Santiago Bumpers, MD Taking Active  Olopatadine HCl 0.7 % SOLN [id number] No Apply 1 drop to eye daily. [provider] Taking Active   omalizumab Geoffry Paradise) 150 MG/ML prefilled syringe [id number] No Inject 300 mg into the skin every 28 (twenty-eight) days. Kozlow, Alvira Philips, MD Taking Active   omalizumab Geoffry Paradise) injection 300 mg [id number]   Jessica Priest, MD  Active   Omega-3 Fatty Acids (FISH OIL) 1200 MG CAPS [id number] No Take 1 capsule by mouth. [provider] Taking Active   predniSONE (DELTASONE) 20 MG tablet [id number]  Take 2 tablets (40 mg total) by mouth daily with breakfast for 4 days. Verlee Monte, MD  Active   Probiotic Product (PROBIOTIC DAILY PO) [id number] No Take 1 capsule by mouth as needed. [provider] Taking Active Self  Respiratory Therapy Supplies (NEBULIZER MASK ADULT) MISC [id number] No 1 Device by Does not apply route as  needed. Kozlow, Alvira Philips, MD Taking Active   simvastatin (ZOCOR) 80 MG tablet [id number] No Take 80 mg by mouth at bedtime. [provider] Taking Active   Specialty Vitamins Products (MG PLUS PROTEIN) 133 MG TABS [id number] No Take 1 tablet by mouth daily. [provider] Taking Active   sulfamethoxazole-trimethoprim (BACTRIM DS) 800-160 MG tablet [id number]  Take 1 tablet by mouth 2 (two) times daily for 10 days. Verlee Monte, MD  Active   terconazole (TERAZOL 7) 0.4 % vaginal cream [id number] No Place 1 applicator vaginally at bedtime. [provider] Taking Active   triamcinolone (KENALOG) 0.025 % ointment [id number] No Apply topically 2 (two) times daily. Moses Manners, MD Taking Active   Vitamin E 100 units TABS [id number] No Take 1 capsule by mouth daily. [provider] Taking Active            Kelsey Bullock Active Problem List   Diagnosis Date Noted   Folliculitis 04/20/2022   Trigger finger of right thumb 09/06/2021   Primary osteoarthritis of left knee 08/15/2021   Eustachian tube dysfunction, bilateral 07/18/2021   Vaginal pruritus 07/18/2021   Photosensitivity dermatitis 01/06/2021   Recurrent ventral incisional hernia 11/25/2020   Ventral hernia 10/21/2020   Polypharmacy 10/21/2020   Dehydration 04/19/2020   Pap smear for cervical cancer screening 02/25/2020   Constipation 03/22/2019   Hx of adenomatous colonic polyps    Pilonidal cyst 02/22/2018   Diastolic CHF with preserved left ventricular function, NYHA class 2 (HCC) 12/14/2016   GERD (gastroesophageal reflux disease) 02/06/2015   Severe persistent asthma 02/06/2015   Intertrigo 10/22/2014   Depression, major, recurrent (HCC) 02/19/2014   Sleep apnea 03/10/2009   Seborrheic eczema 07/17/2007   LOW BACK PAIN SYNDROME 11/26/2006   HYPERCHOLESTEROLEMIA 08/02/2006   Morbid obesity (HCC) 08/02/2006   Anxiety state 08/02/2006   PSEUDOTUMOR CEREBRI 08/02/2006   OSTEOARTHRITIS, LOWER LEG  08/02/2006   FIBROMYALGIA, FIBROMYOSITIS 08/02/2006   Conditions to be addressed/monitored per PCP order:  Chronic healthcare management needs, CHF, GERD, asthma, rhinitis, OSA, osteoarthritis, LBP, anxiety/depression  Care Plan : RN Care Manager Plan of Care  Updates made by Danie Chandler, RN since 12/01/2022 12:00 AM     Problem: Health Promotion or Disease Self-Management (General Plan of Care)      Long-Range Goal: Chronic Disease Management and Care Coordination Needs   Start Date: 02/27/2022  Expected End Date: 03/03/2023  Priority: High  Note:   Current Barriers:  Knowledge Deficits related to plan of care for management of CHF, GERD, asthma, osteoarthritis, constipation, sleep apnea Care Coordination needs related to financial  constraints Chronic Disease Management support and education needs related to CHF, GERD, Asthma,osteoarthritis, constipation, sleep apnea. 12/01/22:  Kelsey Bullock with sinus infection-Abx prescribed by provider.  Continues therapy services with Dr. Sandria Manly.    RNCM Clinical Goal(s):  Kelsey Bullock will verbalize understanding of plan for management of CHF, GERD, asthma, osteoarthritis, constipation, sleep apnea as evidenced by Kelsey Bullock report verbalize basic understanding of  CHF, GERD, asthma, osteoarthritis, constipation, sleep apnea disease process and self health management plan as evidenced by Kelsey Bullock report take all medications exactly as prescribed and will call provider for medication related questions as evidenced by Kelsey Bullock report demonstrate understanding of rationale for each prescribed medication as evidenced by Kelsey Bullock report attend all scheduled medical appointments as evidenced by Kelsey Bullock report continue to work with RN Care Manager to address care management and care coordination needs related to  CHF, GERD, asthma, osteoarthritis, constipation, sleep apnea as evidenced by adherence to CM Team Scheduled appointments through collaboration with RN Care  manager, provider, and care team.   Interventions: Inter-disciplinary care team collaboration (see longitudinal plan of care) Evaluation of current treatment plan related to  self management and Kelsey Bullock's adherence to plan as established by provider Pharmacy referral for medication review Collaborated with Pharmacy BSW for food and legal resources-completed Collaborated with BSW  Asthma: (Status:New goal.) Long Term Goal Provided Kelsey Bullock with basic written and verbal Asthma education on self care/management/and exacerbation prevention Advised Kelsey Bullock to track and manage Asthma triggers Provided education about and advised Kelsey Bullock to utilize infection prevention strategies to reduce risk of respiratory infection Discussed the importance of adequate rest and management of fatigue with Asthma Assessed social determinant of health barriers   CAD Interventions: (Status:  New goal.) Long Term Goal Assessed understanding of CAD diagnosis Medications reviewed including medications utilized in CAD treatment plan Provided education on importance of blood pressure control in management of CAD Provided education on Importance of limiting foods high in cholesterol Counseled on importance of regular laboratory monitoring as prescribed Reviewed Importance of taking all medications as prescribed Reviewed Importance of attending all scheduled provider appointments Advised to report any changes in symptoms or exercise tolerance Assessed social determinant of health barriers  Heart Failure Interventions:  (Status:  New goal.) Long Term Goal Basic overview and discussion of pathophysiology of Heart Failure reviewed Discussed the importance of keeping all appointments with provider Assessed social determinant of health barriers     (Status:  New goal.)  Long Term Goal Evaluation of current treatment plan related to  CHF, GERD, asthma, osteoarthritis, constipation, sleep apnea , Transportation and  Housing barriers self-management and Kelsey Bullock's adherence to plan as established by provider. Discussed plans with Kelsey Bullock for ongoing care management follow up and provided Kelsey Bullock with direct contact information for care management team Evaluation of current treatment plan related to chronic health conditions  and Kelsey Bullock's adherence to plan as established by provider Advised Kelsey Bullock to provide appropriate vaccination information to provider or CM team member at next visit Advised Kelsey Bullock to contact Legal Aid as a resource Reviewed medications with Kelsey Bullock  Provided Kelsey Bullock with information about Legal Aid and transportation Reviewed scheduled/upcoming provider appointments Discussed plans with Kelsey Bullock for ongoing care management follow up and provided Kelsey Bullock with direct contact information for care management team 03/24/22:  Kelsey Bullock states she contacted Legal Aid and is currently working with them.  SDOH Barriers (Status:  New goal.) Long Term Goal Kelsey Bullock interviewed and SDOH assessment performed        Kelsey Bullock interviewed and appropriate assessments performed Provided  Kelsey Bullock with information about Legal Aid and [facility] Transportation services. Discussed plans with Kelsey Bullock for ongoing care management follow up and provided Kelsey Bullock with direct contact information for care management team  Kelsey Bullock Goals/Self-Care Activities: Take all medications as prescribed Attend all scheduled provider appointments Call pharmacy for medication refills 3-7 days in advance of running out of medications Perform all self care activities independently  Perform IADL's (shopping, preparing meals, housekeeping, managing finances) independently Call provider office for new concerns or questions   Follow Up Plan:  The Kelsey Bullock has been provided with contact information for the care management team and has been advised to call with any health related questions or concerns.  The care management team will reach out to  the Kelsey Bullock again over the next 60  business  days.    Long-Range Goal: Establish Plan of Care for Chronic Disease Management Needs   Priority: High  Note:   Timeframe:  Long-Range Goal Priority:  High Start Date:   02/27/22                          Expected End Date:ongoing                       Follow Up Date 01/01/23:  Seen by Allergy and Asthma 6/26, has appt with PCP 7/3   - practice safe sex - schedule appointment for flu shot - schedule appointment for vaccines needed due to my age or health - schedule recommended health tests (blood work, mammogram, colonoscopy, pap test) - schedule and keep appointment for annual check-up    Why is this important?   Screening tests can find diseases early when they are easier to treat.  Your doctor or nurse will talk with you about which tests are important for you.  Getting shots for common diseases like the flu and shingles will help prevent them.   12/01/22: Seen by Allergy and Asthma 6/26, has appt with PCP 7/3   Follow Up:  Kelsey Bullock agrees to Care Plan and Follow-up.  Plan: The Managed Medicaid care management team will reach out to the Kelsey Bullock again over the next 30 business  days. and The  Kelsey Bullock has been provided with contact information for the Managed Medicaid care management team and has been advised to call with any health related questions or concerns.  Date/time of next scheduled RN care management/care coordination outreach:  01/01/23 at 1030.

## 2022-12-06 ENCOUNTER — Encounter: Payer: Self-pay | Admitting: Family Medicine

## 2022-12-06 ENCOUNTER — Ambulatory Visit (INDEPENDENT_AMBULATORY_CARE_PROVIDER_SITE_OTHER): Payer: Medicaid Other | Admitting: Family Medicine

## 2022-12-06 VITALS — BP 118/70 | HR 74 | Ht 63.0 in | Wt 294.0 lb

## 2022-12-06 DIAGNOSIS — M545 Low back pain, unspecified: Secondary | ICD-10-CM | POA: Diagnosis not present

## 2022-12-06 DIAGNOSIS — E7800 Pure hypercholesterolemia, unspecified: Secondary | ICD-10-CM | POA: Diagnosis not present

## 2022-12-06 DIAGNOSIS — G4730 Sleep apnea, unspecified: Secondary | ICD-10-CM

## 2022-12-06 DIAGNOSIS — G8929 Other chronic pain: Secondary | ICD-10-CM

## 2022-12-06 DIAGNOSIS — J4550 Severe persistent asthma, uncomplicated: Secondary | ICD-10-CM | POA: Diagnosis not present

## 2022-12-06 DIAGNOSIS — Z79899 Other long term (current) drug therapy: Secondary | ICD-10-CM | POA: Diagnosis not present

## 2022-12-06 DIAGNOSIS — L219 Seborrheic dermatitis, unspecified: Secondary | ICD-10-CM | POA: Diagnosis not present

## 2022-12-06 DIAGNOSIS — I5030 Unspecified diastolic (congestive) heart failure: Secondary | ICD-10-CM

## 2022-12-06 MED ORDER — KETOCONAZOLE 2 % EX SHAM: 1.0000 | MEDICATED_SHAMPOO | CUTANEOUS | 6 refills | Status: AC

## 2022-12-06 MED ORDER — EPIPEN 2-PAK 0.3 MG/0.3ML IJ SOAJ: 0.3000 mg | INTRAMUSCULAR | 1 refills | Status: DC | PRN

## 2022-12-06 MED ORDER — DHS ZINC 2 % EX SHAM: MEDICATED_SHAMPOO | CUTANEOUS | 3 refills | Status: DC

## 2022-12-06 MED ORDER — CYCLOBENZAPRINE HCL 10 MG PO TABS: 10.0000 mg | ORAL_TABLET | Freq: Three times a day (TID) | ORAL | 3 refills | Status: AC | PRN

## 2022-12-06 MED ORDER — TRIAMCINOLONE ACETONIDE 0.025 % EX OINT: TOPICAL_OINTMENT | Freq: Two times a day (BID) | CUTANEOUS | 5 refills | Status: DC

## 2022-12-06 NOTE — Patient Instructions (Signed)
Please make an appointment to see your new provider in about 3 months. I will send you a note about your lab work. Great to meet you!

## 2022-12-07 LAB — COMPREHENSIVE METABOLIC PANEL
ALT: 11 IU/L (ref 0–32)
AST: 18 IU/L (ref 0–40)
Albumin: 4.3 g/dL (ref 3.8–4.9)
Alkaline Phosphatase: 115 IU/L (ref 44–121)
BUN/Creatinine Ratio: 18 (ref 12–28)
BUN: 16 mg/dL (ref 8–27)
Bilirubin Total: 0.4 mg/dL (ref 0.0–1.2)
CO2: 21 mmol/L (ref 20–29)
Calcium: 9.2 mg/dL (ref 8.7–10.3)
Chloride: 100 mmol/L (ref 96–106)
Creatinine, Ser: 0.88 mg/dL (ref 0.57–1.00)
Globulin, Total: 2.7 g/dL (ref 1.5–4.5)
Glucose: 90 mg/dL (ref 70–99)
Potassium: 4.8 mmol/L (ref 3.5–5.2)
Sodium: 140 mmol/L (ref 134–144)
Total Protein: 7 g/dL (ref 6.0–8.5)
eGFR: 75 mL/min/{1.73_m2} (ref >59)

## 2022-12-08 ENCOUNTER — Encounter: Payer: Self-pay | Admitting: Family Medicine

## 2022-12-08 NOTE — Progress Notes (Signed)
    CHIEF COMPLAINT / HPI: #1.  Obstructive sleep apnea: Her home health company is advanced.  She needs a new mask and tubing. 2.  Chronic severe asthma followed by pulmonary.  Right now she is on Bactrim per them for sinus infection. 3.  Wants me to look at a mole on her anterior neck/chest. 4.  Medication noncompliance: Says she is not taking any of her medicines really regularly.  She does bring them with her.   PERTINENT  PMH / PSH: I have reviewed the patient's medications, allergies, past medical and surgical history, smoking status and updated in the EMR as appropriate.   OBJECTIVE:  BP 118/70   Pulse 74   Ht 5\' 3"  (1.6 m)   Wt 294 lb (133.4 kg)   LMP 02/03/2019 Comment: Tubal Ligation  BMI 52.08 kg/m  GENERAL: Well-developed female no acute distress SKIN: Small skin tag on the anterior chest right above the [place] joint.  It is mobile.  Very small, 2 to 3 mm in diameter.  Benign in appearance. CV: Regular rate and rhythm without murmur RESPIRATORY: Clear to auscultation bilaterally with no unusual increased work of breathing.  ASSESSMENT / PLAN: She will follow-up with her new PCP in the next 2 months.  Morbid obesity (HCC) Briefly discussed her prior morbid obesity.  She is lost down to a currently stable weight.  She is taking some type of over-the-counter medication/supplement for weight loss.  Polypharmacy I reviewed his many of her medications today as possible.  She has got multiple bottles of almost every medication.  Some of these are several years old.  I asked her which medication she thought she needed to be taking regularly.  She wanted me to refill the shampoo for her seborrheic dermatitis.  She said the montelukast sodium helped and she took it fairly regularly.  She is not taking the simvastatin.  She occasionally takes the atorvastatin.  She uses some of the creams, specifically the triamcinolone ointment.    Severe persistent asthma She apparently does not use  her inhalers regularly including her Atrovent.  She says she uses it when she needs it.  She does have an EpiPen, but it is outdated so I will send in a refill.  Sleep apnea Rx for new mask and tubing sent   Denny Levy MD

## 2022-12-08 NOTE — Assessment & Plan Note (Signed)
I reviewed his many of her medications today as possible.  She has got multiple bottles of almost every medication.  Some of these are several years old.  I asked her which medication she thought she needed to be taking regularly.  She wanted me to refill the shampoo for her seborrheic dermatitis.  She said the montelukast sodium helped and she took it fairly regularly.  She is not taking the simvastatin.  She occasionally takes the atorvastatin.  She uses some of the creams, specifically the triamcinolone ointment.

## 2022-12-08 NOTE — Assessment & Plan Note (Signed)
She apparently does not use her inhalers regularly including her Atrovent.  She says she uses it when she needs it.  She does have an EpiPen, but it is outdated so I will send in a refill.

## 2022-12-08 NOTE — Assessment & Plan Note (Signed)
Briefly discussed her prior morbid obesity.  She is lost down to a currently stable weight.  She is taking some type of over-the-counter medication/supplement for weight loss.

## 2022-12-08 NOTE — Assessment & Plan Note (Signed)
Rx for new mask and tubing sent

## 2022-12-11 ENCOUNTER — Ambulatory Visit (INDEPENDENT_AMBULATORY_CARE_PROVIDER_SITE_OTHER): Payer: Medicaid Other

## 2022-12-11 DIAGNOSIS — J4550 Severe persistent asthma, uncomplicated: Secondary | ICD-10-CM | POA: Diagnosis not present

## 2022-12-12 ENCOUNTER — Ambulatory Visit (INDEPENDENT_AMBULATORY_CARE_PROVIDER_SITE_OTHER): Payer: Medicaid Other | Admitting: Allergy and Immunology

## 2022-12-12 ENCOUNTER — Other Ambulatory Visit: Payer: Self-pay

## 2022-12-12 ENCOUNTER — Encounter: Payer: Self-pay | Admitting: Allergy and Immunology

## 2022-12-12 ENCOUNTER — Telehealth: Payer: Self-pay

## 2022-12-12 VITALS — BP 130/78 | HR 94 | Temp 98.3°F | Resp 14 | Ht 63.0 in | Wt 296.0 lb

## 2022-12-12 DIAGNOSIS — J3089 Other allergic rhinitis: Secondary | ICD-10-CM | POA: Diagnosis not present

## 2022-12-12 DIAGNOSIS — J4551 Severe persistent asthma with (acute) exacerbation: Secondary | ICD-10-CM | POA: Diagnosis not present

## 2022-12-12 DIAGNOSIS — K219 Gastro-esophageal reflux disease without esophagitis: Secondary | ICD-10-CM

## 2022-12-12 MED ORDER — ALBUTEROL SULFATE HFA 108 (90 BASE) MCG/ACT IN AERS: 2.0000 | INHALATION_SPRAY | RESPIRATORY_TRACT | 1 refills | Status: DC | PRN

## 2022-12-12 MED ORDER — CETIRIZINE HCL 10 MG PO TABS: 10.0000 mg | ORAL_TABLET | Freq: Two times a day (BID) | ORAL | 5 refills | Status: DC | PRN

## 2022-12-12 MED ORDER — FAMOTIDINE 20 MG PO TABS: 20.0000 mg | ORAL_TABLET | Freq: Every morning | ORAL | 1 refills | Status: DC

## 2022-12-12 MED ORDER — EPINEPHRINE 0.3 MG/0.3ML IJ SOAJ: 0.3000 mg | INTRAMUSCULAR | 2 refills | Status: DC | PRN

## 2022-12-12 MED ORDER — FLUTICASONE PROPIONATE 50 MCG/ACT NA SUSP: 1.0000 | Freq: Every day | NASAL | 1 refills | Status: DC | PRN

## 2022-12-12 MED ORDER — DULERA 200-5 MCG/ACT IN AERO: 2.0000 | INHALATION_SPRAY | Freq: Two times a day (BID) | RESPIRATORY_TRACT | 5 refills | Status: DC

## 2022-12-12 MED ORDER — MONTELUKAST SODIUM 10 MG PO TABS: 10.0000 mg | ORAL_TABLET | Freq: Every evening | ORAL | 1 refills | Status: DC

## 2022-12-12 NOTE — Progress Notes (Unsigned)
[place] - [place] - [place] - [place] - [place]   Follow-up Note  Referring Provider: [name], DO Primary Provider: [name], DO Date of Office Visit: 12/12/2022  Subjective:   Kelsey Bullock (DOB: May 22, 1961) is a 62 y.o. female who [facility] in re-evaluation of the following:  HPI: Danyiel returns to this clinic in evaluation of asthma, allergic rhinitis, LPR.  I last saw her in this clinic 03 October 2022.  She did have a visit with Dr. Maurine Minister on 29 November 2022.  During her last visit with Dr. Maurine Minister she appeared to have viral respiratory tract infection for which she was given doxycycline and some prednisone as well as an injection of Depo-Medrol.  Unfortunately, she could not really use the doxycycline because of a an insurance issue.  She continues to have a feeling as though her throat is stopped up with some coughing and some throat clearing.  She has had some coughing that appears to respond to a short acting bronchodilator and she has been using this agent recently.  She is not using any Dulera.  She is not using any Flonase.  Her reflux has also been very active recently.  She is having regurgitation and burning in her throat.  She is not really using any famotidine.  Allergies as of 12/12/2022       Reactions   Amoxicillin Anaphylaxis, Swelling   Etodolac Anaphylaxis, Shortness Of Breath    lips swelled  lips swelled  lips swelled   Penicillins Anaphylaxis, Swelling, Rash   DID THE REACTION INVOLVE: Swelling of the face/tongue/throat, SOB, or low BP? Yes Sudden or severe rash/hives, skin peeling, or the inside of the mouth or nose? No Did it require medical treatment? No When did it last happen?      20 years ago If all above answers are "NO", may proceed with cephalosporin use.   Latex Rash, Hives   Pantoprazole Sodium Hives   unspecified REACTION: unspecified   Wound Dressing Adhesive Rash    Ampicillin Swelling   Augmentin [amoxicillin-pot Clavulanate] Swelling   Diclofenac Sodium    Unknown reaction    Pantoprazole Sodium    unspecified   Adhesive [tape] Rash        Medication List    albuterol (2.5 MG/3ML) 0.083% nebulizer solution Commonly known as: PROVENTIL Take 3 mLs (2.5 mg total) by nebulization every 4 (four) hours as needed. USE 1 VIAL VIA NEBULIZER EVERY 4 TO 6 HOURS AS NEEDED FOR COUGH OR WHEEZING   albuterol 108 (90 Base) MCG/ACT inhaler Commonly known as: VENTOLIN HFA Inhale 2 puffs into the lungs every 4 (four) hours as needed.   atorvastatin 20 MG tablet Commonly known as: LIPITOR Take 1 tablet (20 mg total) by mouth daily.   cetirizine 10 MG tablet Commonly known as: ZYRTEC Take 1 tablet (10 mg total) by mouth 2 (two) times daily as needed for allergies (Can take an extra dose for breakthrough symptoms).   clonazePAM 1 MG tablet Commonly known as: KLONOPIN Take 1 mg by mouth as needed.   cyclobenzaprine 10 MG tablet Commonly known as: FLEXERIL Take 1 tablet (10 mg total) by mouth 3 (three) times daily as needed for muscle spasms.   DHS Zinc 2 % Sham Generic drug: Pyrithione Zinc Apply to scalp 3 to 5 times a week as ditreted   diclofenac Sodium 1 % Gel Commonly known as: VOLTAREN Apply 4 g topically 4 (  four) times daily.   Dulera 200-5 MCG/ACT Aero Generic drug: mometasone-formoterol Inhale 2 puffs into the lungs in the morning and at bedtime. Take twice daily for 1-2 weeks with respiratory illness or asthma flare.   EPINEPHrine 0.3 mg/0.3 mL Soaj injection Commonly known as: EpiPen 2-Pak Inject 0.3 mg into the muscle as needed for anaphylaxis.   famotidine 20 MG tablet Commonly known as: PEPCID Take 1 tablet (20 mg total) by mouth in the morning.   Fish Oil 1200 MG Caps Take 1 capsule by mouth.   fluticasone 50 MCG/ACT nasal spray Commonly known as: FLONASE Place 1 spray into both nostrils daily as needed for allergies or  rhinitis.   ipratropium 0.06 % nasal spray Commonly known as: ATROVENT Place 2 sprays into both nostrils 4 (four) times daily. USE 2 SPRAYS INTO EACH NOSTRIL EVERY 6 HOURS AS NEEDED FOR TO DRY UP NOSE.   ketoconazole 2 % shampoo Commonly known as: NIZORAL Apply 1 Application topically 2 (two) times a week.   linaclotide 72 MCG capsule Commonly known as: LINZESS Take 2 capsules (144 mcg total) by mouth daily before breakfast.   MG Plus Protein 133 MG Tabs Take 1 tablet by mouth daily.   montelukast 10 MG tablet Commonly known as: SINGULAIR Take 1 tablet (10 mg total) by mouth at bedtime. Take one tablet by mouth at bedtime   Nebulizer Mask Adult Misc 1 Device by Does not apply route as needed.   Olopatadine HCl 0.7 % Soln Apply 1 drop to eye daily.   omalizumab 150 MG/ML prefilled syringe Commonly known as: Xolair Inject 300 mg into the skin every 28 (twenty-eight) days.   triamcinolone 0.025 % ointment Commonly known as: KENALOG Apply topically 2 (two) times daily.    Past Medical History:  Diagnosis Date   Allergic rhinitis    Anemia    Anxiety    Arthritis    knees and back   Asthma    Complication of anesthesia    Congestive heart failure (CHF) (HCC)    Depression    Gall stone    GERD (gastroesophageal reflux disease)    Headache    migraines, stress headaches   History of kidney stones    Hypertension    Hyperthyroidism    during pregnancy   Pneumonia    PONV (postoperative nausea and vomiting)    Recurrent upper respiratory infection (URI)    Sleep apnea    can't use Cpap    Past Surgical History:  Procedure Laterality Date   ADENOIDECTOMY     APPLICATION OF WOUND VAC  03/18/2019   APPLICATION OF WOUND VAC N/A 03/18/2019   Procedure: Application Of Wound Vac;  Surgeon: Manus Rudd, MD;  Location: [facility] OR;  Service: General;  Laterality: N/A;   BIOPSY  05/27/2018   Procedure: BIOPSY;  Surgeon: Napoleon Form, MD;  Location: [facility]COPY;   Service: Endoscopy;;   CESAREAN SECTION     COLONOSCOPY WITH PROPOFOL N/A 05/27/2018   Procedure: COLONOSCOPY WITH PROPOFOL;  Surgeon: Napoleon Form, MD;  Location: [facility];  Service: Endoscopy;  Laterality: N/A;   CYST EXCISION     EXPLORATORY LAPAROTOMY  02/03/2019   INGUINAL HERNIA REPAIR N/A 02/03/2019   Procedure: EXPLORATORY LAPAROTOMY WITH SMALL BOWEL RESECTION AND PRIMARY CLOSURE OF VENTRAL HERNIA;  Surgeon: Manus Rudd, MD;  Location: [facility] OR;  Service: General;  Laterality: N/A;   INSERTION OF MESH N/A 11/25/2020   Procedure: INSERTION OF MESH;  Surgeon: Manus Rudd,  MD;  Location: [facility] OR;  Service: General;  Laterality: N/A;   OVARIAN CYST REMOVAL     POLYPECTOMY  05/27/2018   Procedure: POLYPECTOMY;  Surgeon: Napoleon Form, MD;  Location: [facility];  Service: Endoscopy;;   removal of gallstones     TONSILLECTOMY     TYMPANOSTOMY TUBE PLACEMENT     VENTRAL HERNIA REPAIR N/A 11/25/2020   Procedure: RECURRENT VENTRAL HERNIA REPAIR WITH MESH;  Surgeon: Manus Rudd, MD;  Location: [facility] OR;  Service: General;  Laterality: N/A;   WOUND EXPLORATION  03/18/2019   abdominal   WOUND EXPLORATION N/A 03/18/2019   Procedure: ABDOMINAL WOUND EXPLORATION;  Surgeon: Manus Rudd, MD;  Location: [facility] OR;  Service: General;  Laterality: N/A;    Review of systems negative except as noted in HPI / PMHx or noted below:  Review of Systems  Constitutional: Negative.   HENT: Negative.    Eyes: Negative.   Respiratory: Negative.    Cardiovascular: Negative.   Gastrointestinal: Negative.   Genitourinary: Negative.   Musculoskeletal: Negative.   Skin: Negative.   Neurological: Negative.   Endo/Heme/Allergies: Negative.   Psychiatric/Behavioral: Negative.       Objective:   Vitals:   12/12/22 0927  BP: 130/78  Pulse: 94  Resp: 14  Temp: 98.3 F (36.8 C)  SpO2: 97%   Height: 5\' 3"  (160 cm)  Weight: 296 lb (134.3 kg)   Physical Exam Constitutional:       Appearance: She is not diaphoretic.  HENT:     Head: Normocephalic.     Right Ear: Tympanic membrane, ear canal and external ear normal.     Left Ear: Tympanic membrane, ear canal and external ear normal.     Nose: Nose normal. No mucosal edema or rhinorrhea.     Mouth/Throat:     Pharynx: Uvula midline. No oropharyngeal exudate.  Eyes:     Conjunctiva/sclera: Conjunctivae normal.  Neck:     Thyroid: No thyromegaly.     Trachea: Trachea normal. No tracheal tenderness or tracheal deviation.  Cardiovascular:     Rate and Rhythm: Normal rate and regular rhythm.     Heart sounds: Normal heart sounds, S1 normal and S2 normal. No murmur heard. Pulmonary:     Effort: No respiratory distress.     Breath sounds: Normal breath sounds. No stridor. No wheezing or rales.  Lymphadenopathy:     Head:     Right side of head: No tonsillar adenopathy.     Left side of head: No tonsillar adenopathy.     Cervical: No cervical adenopathy.  Skin:    Findings: No erythema or rash.     Nails: There is no clubbing.  Neurological:     Mental Status: She is alert.     Diagnostics: Spirometry was performed and demonstrated an FEV1 of 1.67 at 70 % of predicted.  Assessment and Plan:   1. Asthma, not well controlled, severe persistent, with acute exacerbation   2. Other allergic rhinitis   3. LPRD (laryngopharyngeal reflux disease)      1. Continue Xolair (Epi-Pen)  2. Consistently use the following:   A. Dulera 200 - 2 inhalations 2 times per day  B. Flonase - 1 spray each nostril 2 times per day  C. Famotidine 20 mg - 1 tablet 2 times per day  2.  If needed:   A.  Pro-air HFA  B.  Antihistamine   C.  montelukast 10 mg daily  3. Return to clinic  in 4 weeks or earlier if problem. Taper medications???  Ky has inflammation of her airway and she has some active reflux and both these conditions need to be addressed with consistent use of Dulera and Flonase and famotidine as noted above.  I  will see her back in this clinic in 4 weeks and we will make a determination about whether or not we can consolidate some of her medical therapy as she is concerned about using medications on a long-term basis.   Laurette Schimke, MD Allergy / Immunology [facility]

## 2022-12-12 NOTE — Patient Instructions (Signed)
  1. Continue Xolair (Epi-Pen)  2. Consistently use the following:   A. Dulera 200 - 2 inhalations 2 times per day  B. Flonase - 1 spray each nostril 2 times per day  C. Famotidine 20 mg - 1 tablet 2 times per day  2.  If needed:   A.  Pro-air HFA  B.  Antihistamine   C.  montelukast 10 mg daily  3. Return to clinic in 4 weeks or earlier if problem. Taper medications???

## 2022-12-12 NOTE — Telephone Encounter (Signed)
Patient returns call to nurse line. Advised of results per Dr. Jennette Kettle.   Veronda Prude, RN

## 2022-12-12 NOTE — Telephone Encounter (Signed)
Patient LVM on nurse line regarding results from visit on 12/06/22.  Spoke with Dr. Jennette Kettle. Advised that I could inform patient of normal results.   Attempted to return call to patient. She did not answer, LVM asking that she return call to office.   Veronda Prude, RN

## 2022-12-13 ENCOUNTER — Encounter: Payer: Self-pay | Admitting: Allergy and Immunology

## 2022-12-28 ENCOUNTER — Other Ambulatory Visit: Payer: Self-pay | Admitting: Allergy and Immunology

## 2023-01-01 ENCOUNTER — Encounter: Payer: Self-pay | Admitting: Obstetrics and Gynecology

## 2023-01-01 ENCOUNTER — Other Ambulatory Visit: Payer: Medicaid Other | Admitting: Obstetrics and Gynecology

## 2023-01-01 NOTE — Patient Outreach (Signed)
Medicaid Managed Care   Nurse Care Manager Note  01/01/2023 Name:  CALOGERA WISSNER MRN:  [id number] DOB:  September 13, 1960  Kelsey Bullock is an 62 y.o. year old female who is a primary patient of [name], DO.  The [facility] Managed Care Coordination team was consulted for assistance with:    Chronic healthcare management needs, anxiety/depression, CHF, GERD, asthma, rhinitis, OSA, osteoarthritis, LBP, LPRD  Ms. Mikolajczyk was given information about Medicaid Managed Care Coordination team services today. [name] Patient agreed to services and verbal consent obtained.  Engaged with patient by telephone for follow up visit in response to provider referral for case management and/or care coordination services.   Assessments/Interventions:  Review of past medical history, allergies, medications, health status, including review of consultants reports, laboratory and other test data, was performed as part of comprehensive evaluation and provision of chronic care management services.  SDOH (Social Determinants of Health) assessments and interventions performed: SDOH Interventions    Flowsheet Row Patient Outreach Telephone from 01/01/2023 in [place] POPULATION HEALTH DEPARTMENT Patient Outreach Telephone from 12/01/2022 in [place] POPULATION HEALTH DEPARTMENT Patient Outreach Telephone from 08/30/2022 in [place] POPULATION HEALTH DEPARTMENT Patient Outreach Telephone from 08/01/2022 in [place] POPULATION HEALTH DEPARTMENT Patient Outreach Telephone from 07/06/2022 in [place] POPULATION HEALTH DEPARTMENT Patient Outreach Telephone from 06/07/2022 in [place] POPULATION HEALTH DEPARTMENT  SDOH Interventions        Food Insecurity Interventions -- --  [BSW gave resources, sister also helps] -- -- -- --  Housing Interventions -- Other (Comment)  [patient given resources, discussed UHC MM? pest control services] -- -- -- --  Transportation Interventions -- -- Intervention  Not Indicated -- -- --  Utilities Interventions -- -- -- -- -- Intervention Not Indicated  Alcohol Usage Interventions Intervention Not Indicated (Score <7) -- -- -- -- Intervention Not Indicated (Score <7)  Financial Strain Interventions -- -- -- -- Other (Comment)  [patient provided resources] --  Physical Activity Interventions -- -- -- Intervention Not Indicated -- --  Stress Interventions -- -- -- -- Other (Comment)  [patient has therapist/counselor and Psychiatrist that she sees] --  Social Connections Interventions -- -- -- Intervention Not Indicated -- --  Health Literacy Interventions Intervention Not Indicated -- -- -- -- --     Care Plan  Allergies  Allergen Reactions   Amoxicillin Anaphylaxis and Swelling   Etodolac Anaphylaxis and Shortness Of Breath     lips swelled  lips swelled  lips swelled   Penicillins Anaphylaxis, Swelling and Rash    DID THE REACTION INVOLVE: Swelling of the face/tongue/throat, SOB, or low BP? Yes Sudden or severe rash/hives, skin peeling, or the inside of the mouth or nose? No Did it require medical treatment? No When did it last happen?      20 years ago If all above answers are "NO", may proceed with cephalosporin use.    Latex Rash and Hives   Pantoprazole Sodium Hives    unspecified REACTION: unspecified   Wound Dressing Adhesive Rash   Ampicillin Swelling   Augmentin [Amoxicillin-Pot Clavulanate] Swelling   Diclofenac Sodium     Unknown reaction    Pantoprazole Sodium     unspecified   Adhesive [Tape] Rash    Medications Reviewed Today     Reviewed by Danie Chandler, RN (Registered Nurse) on 01/01/23 at 1044  Med List Status: <None>   Medication Order Taking? Sig Documenting Provider Last Dose Status Informant  albuterol (  PROVENTIL) (2.5 MG/3ML) 0.083% nebulizer solution [id number] No Take 3 mLs (2.5 mg total) by nebulization every 4 (four) hours as needed. USE 1 VIAL VIA NEBULIZER EVERY 4 TO 6 HOURS AS NEEDED FOR COUGH OR  WHEEZING Verlee Monte, MD Taking Active   albuterol (VENTOLIN HFA) 108 (90 Base) MCG/ACT inhaler [id number]  Inhale 2 puffs into the lungs every 4 (four) hours as needed. Kozlow, Alvira Philips, MD  Active   atorvastatin (LIPITOR) 20 MG tablet [id number] No Take 1 tablet (20 mg total) by mouth daily. Nestor Ramp, MD Taking Active   cetirizine (ZYRTEC) 10 MG tablet [id number]  Take 1 tablet (10 mg total) by mouth 2 (two) times daily as needed for allergies (Can take an extra dose for breakthrough symptoms). Kozlow, Alvira Philips, MD  Active   clonazePAM (KLONOPIN) 1 MG tablet [id number] No Take 1 mg by mouth as needed. [provider] Taking Active   cyclobenzaprine (FLEXERIL) 10 MG tablet [id number] No Take 1 tablet (10 mg total) by mouth 3 (three) times daily as needed for muscle spasms. Nestor Ramp, MD Taking Active   diclofenac Sodium (VOLTAREN) 1 % GEL [id number] No Apply 4 g topically 4 (four) times daily. Littie Deeds, MD Taking Active   EPINEPHrine (EPIPEN 2-PAK) 0.3 mg/0.3 mL IJ SOAJ injection [id number]  Inject 0.3 mg into the muscle as needed for anaphylaxis. Kozlow, Alvira Philips, MD  Active   famotidine (PEPCID) 20 MG tablet [id number]  Take 1 tablet (20 mg total) by mouth in the morning. Kozlow, Alvira Philips, MD  Active   fluticasone [facility]) 50 MCG/ACT nasal spray [id number]  Place 1 spray into both nostrils daily as needed for allergies or rhinitis. Kozlow, Alvira Philips, MD  Active   ipratropium (ATROVENT) 0.06 % nasal spray [id number] No Place 2 sprays into both nostrils 4 (four) times daily. USE 2 SPRAYS INTO EACH NOSTRIL EVERY 6 HOURS AS NEEDED FOR TO DRY UP NOSE. Verlee Monte, MD Taking Active   ketoconazole (NIZORAL) 2 % shampoo [id number] No Apply 1 Application topically 2 (two) times a week. Nestor Ramp, MD Taking Active   linaclotide [name]) 72 MCG capsule [id number] No Take 2 capsules (144 mcg total) by mouth daily before breakfast. Moses Manners, MD Taking Active   mometasone-formoterol [facility]) 200-5  MCG/ACT Sandrea Matte [id number]  Inhale 2 puffs into the lungs in the morning and at bedtime. Take twice daily for 1-2 weeks with respiratory illness or asthma flare. Kozlow, Alvira Philips, MD  Active   montelukast (SINGULAIR) 10 MG tablet [id number]  Take 1 tablet (10 mg total) by mouth at bedtime. Take one tablet by mouth at bedtime Kozlow, Alvira Philips, MD  Active   Olopatadine HCl 0.7 % SOLN [id number] No Apply 1 drop to eye daily. [provider] Taking Active   omalizumab Geoffry Paradise) 150 MG/ML prefilled syringe [id number] No Inject 300 mg into the skin every 28 (twenty-eight) days. Kozlow, Alvira Philips, MD Taking Active   omalizumab Geoffry Paradise) injection 300 mg [id number]   Jessica Priest, MD  Active   Omega-3 Fatty Acids (FISH OIL) 1200 MG CAPS [id number] No Take 1 capsule by mouth. [provider] Taking Active   Pyrithione Zinc (DHS ZINC) 2 % SHAM [id number] No Apply to scalp 3 to 5 times a week as ditreted Nestor Ramp, MD Taking Active   Respiratory Therapy Supplies (NEBULIZER MASK ADULT) MISC [id number] No 1 Device by Does not apply route as needed. Kozlow, Alvira Philips, MD Taking Active  Specialty Vitamins Products (MG PLUS PROTEIN) 133 MG TABS [id number] No Take 1 tablet by mouth daily. [provider] Taking Active   triamcinolone (KENALOG) 0.025 % ointment [id number] No Apply topically 2 (two) times daily. Nestor Ramp, MD Taking Active            Patient Active Problem List   Diagnosis Date Noted   Folliculitis 04/20/2022   Trigger finger of right thumb 09/06/2021   Primary osteoarthritis of left knee 08/15/2021   Eustachian tube dysfunction, bilateral 07/18/2021   Vaginal pruritus 07/18/2021   Photosensitivity dermatitis 01/06/2021   Recurrent ventral incisional hernia 11/25/2020   Ventral hernia 10/21/2020   Polypharmacy 10/21/2020   Dehydration 04/19/2020   Pap smear for cervical cancer screening 02/25/2020   Constipation 03/22/2019   Hx of adenomatous colonic polyps    Pilonidal cyst  02/22/2018   Diastolic CHF with preserved left ventricular function, NYHA class 2 (HCC) 12/14/2016   GERD (gastroesophageal reflux disease) 02/06/2015   Severe persistent asthma 02/06/2015   Intertrigo 10/22/2014   Depression, major, recurrent (HCC) 02/19/2014   Sleep apnea 03/10/2009   Seborrheic eczema 07/17/2007   LOW BACK PAIN SYNDROME 11/26/2006   HYPERCHOLESTEROLEMIA 08/02/2006   Morbid obesity (HCC) 08/02/2006   Anxiety state 08/02/2006   PSEUDOTUMOR CEREBRI 08/02/2006   OSTEOARTHRITIS, LOWER LEG 08/02/2006   FIBROMYALGIA, FIBROMYOSITIS 08/02/2006   Conditions to be addressed/monitored per PCP order:  Chronic healthcare management needs, anxiety/depression, CHF, GERD, asthma, rhinitis, OSA, osteoarthritis, LBP, LPRD  Care Plan : RN Care Manager Plan of Care  Updates made by Danie Chandler, RN since 01/01/2023 12:00 AM     Problem: Health Promotion or Disease Self-Management (General Plan of Care)      Long-Range Goal: Chronic Disease Management and Care Coordination Needs   Start Date: 02/27/2022  Expected End Date: 03/03/2023  Priority: High  Note:   Current Barriers:  Knowledge Deficits related to plan of care for management of CHF, GERD, asthma, osteoarthritis, constipation, sleep apnea Care Coordination needs related to financial constraints Chronic Disease Management support and education needs related to CHF, GERD, Asthma,osteoarthritis, constipation, sleep apnea. 01/01/23:  Recent PCP and Allergy and Asthma appt.  Has GYN appt next month.  Therapy services continue.  Medication non compliance. Abdominal pain ? Related to past hernia surgery-? Scar tissue-patient will follow up with provider.  RNCM Clinical Goal(s):  Patient will verbalize understanding of plan for management of CHF, GERD, asthma, osteoarthritis, constipation, sleep apnea as evidenced by patient report verbalize basic understanding of  CHF, GERD, asthma, osteoarthritis, constipation, sleep apnea disease  process and self health management plan as evidenced by patient report take all medications exactly as prescribed and will call provider for medication related questions as evidenced by patient report demonstrate understanding of rationale for each prescribed medication as evidenced by patient report attend all scheduled medical appointments as evidenced by patient report continue to work with RN Care Manager to address care management and care coordination needs related to  CHF, GERD, asthma, osteoarthritis, constipation, sleep apnea as evidenced by adherence to CM Team Scheduled appointments through collaboration with RN Care manager, provider, and care team.   Interventions: Inter-disciplinary care team collaboration (see longitudinal plan of care) Evaluation of current treatment plan related to  self management and patient's adherence to plan as established by provider Pharmacy referral for medication review Collaborated with Pharmacy BSW for food and legal resources-completed Collaborated with BSW  Asthma: (Status:New goal.) Long Term Goal Provided patient with basic written  and verbal Asthma education on self care/management/and exacerbation prevention Advised patient to track and manage Asthma triggers Provided education about and advised patient to utilize infection prevention strategies to reduce risk of respiratory infection Discussed the importance of adequate rest and management of fatigue with Asthma Assessed social determinant of health barriers   CAD Interventions: (Status:  New goal.) Long Term Goal Assessed understanding of CAD diagnosis Medications reviewed including medications utilized in CAD treatment plan Provided education on importance of blood pressure control in management of CAD Provided education on Importance of limiting foods high in cholesterol Counseled on importance of regular laboratory monitoring as prescribed Reviewed Importance of taking all medications  as prescribed Reviewed Importance of attending all scheduled provider appointments Advised to report any changes in symptoms or exercise tolerance Assessed social determinant of health barriers  Heart Failure Interventions:  (Status:  New goal.) Long Term Goal Basic overview and discussion of pathophysiology of Heart Failure reviewed Discussed the importance of keeping all appointments with provider Assessed social determinant of health barriers     (Status:  New goal.)  Long Term Goal Evaluation of current treatment plan related to  CHF, GERD, asthma, osteoarthritis, constipation, sleep apnea , Transportation and Housing barriers self-management and patient's adherence to plan as established by provider. Discussed plans with patient for ongoing care management follow up and provided patient with direct contact information for care management team Evaluation of current treatment plan related to chronic health conditions  and patient's adherence to plan as established by provider Advised patient to provide appropriate vaccination information to provider or CM team member at next visit Advised patient to contact [employer] as a resource Reviewed medications with patient  Provided patient with information about Legal Aid and transportation Reviewed scheduled/upcoming provider appointments Discussed plans with patient for ongoing care management follow up and provided patient with direct contact information for care management team 03/24/22:  Patient states she contacted [employer] Aid and is currently working with them.  SDOH Barriers (Status:  New goal.) Long Term Goal Patient interviewed and SDOH assessment performed        Patient interviewed and appropriate assessments performed Provided patient with information about Legal Aid and [facility] Transportation services. Discussed plans with patient for ongoing care management follow up and provided patient with direct contact information for care  management team  Patient Goals/Self-Care Activities: Take all medications as prescribed Attend all scheduled provider appointments Call pharmacy for medication refills 3-7 days in advance of running out of medications Perform all self care activities independently  Perform IADL's (shopping, preparing meals, housekeeping, managing finances) independently Call provider office for new concerns or questions   Follow Up Plan:  The patient has been provided with contact information for the care management team and has been advised to call with any health related questions or concerns.  The care management team will reach out to the patient again over the next 60  business  days.    Long-Range Goal: Establish Plan of Care for Chronic Disease Management Needs   Priority: High  Note:   Timeframe:  Long-Range Goal Priority:  High Start Date:   02/27/22                          Expected End Date:ongoing                       Follow Up Date 03/02/23   - practice safe sex -  schedule appointment for flu shot - schedule appointment for vaccines needed due to my age or health - schedule recommended health tests (blood work, mammogram, colonoscopy, pap test) - schedule and keep appointment for annual check-up    Why is this important?   Screening tests can find diseases early when they are easier to treat.  Your doctor or nurse will talk with you about which tests are important for you.  Getting shots for common diseases like the flu and shingles will help prevent them.   01/01/23:  Seen by PCP 7/3, monthly appts with Allergy and Asthma.  Upcoming appt with GYN, sees Dr. Sandria Manly on regular basis   Follow Up:  Patient agrees to Care Plan and Follow-up.  Plan: The Managed Medicaid care management team will reach out to the patient again over the next 60 business  days. and The  Patient has been provided with contact information for the Managed Medicaid care management team and has been advised to call  with any health related questions or concerns.  Date/time of next scheduled RN care management/care coordination outreach: 03/02/23 at 1030.

## 2023-01-01 NOTE — Patient Instructions (Signed)
Hi Kelsey Bullock, thank you for updating me this morning, have a nice week!  Kelsey Bullock was given information about Medicaid Managed Care team care coordination services as a part of their [facility] Community Plan Medicaid benefit. Kelsey Bullock verbally consented to engagement with the [facility] Managed Care team.   If you are experiencing a medical emergency, please call 911 or report to your local emergency department or urgent care.   If you have a non-emergency medical problem during routine business hours, please contact your provider's office and ask to speak with a nurse.   For questions related to your [facility], please call: [phone] or visit the homepage here: [email or web address]  If you would like to schedule transportation through your [facility], please call the following number at least 2 days in advance of your appointment: [phone]   Rides for urgent appointments can also be made after hours by calling Member Services.  Call the Behavioral Health Crisis Line at [phone], at any time, 24 hours a day, 7 days a week. If you are in danger or need immediate medical attention call 911.  If you would like help to quit smoking, call 1-800-QUIT-NOW (787-105-4159) OR Espaol: 1-855-Djelo-Ya (6-606-301-6010) o para ms informacin haga clic aqu or Text READY to 932-355 to register via text  Kelsey Bullock - following are the goals we discussed in your visit today:   Goals Addressed    Timeframe:  Long-Range Goal Priority:  High Start Date:   02/27/22                          Expected End Date:ongoing                       Follow Up Date 03/02/23   - practice safe sex - schedule appointment for flu shot - schedule appointment for vaccines needed due to my age or health - schedule recommended health tests (blood work, mammogram, colonoscopy, pap  test) - schedule and keep appointment for annual check-up    Why is this important?   Screening tests can find diseases early when they are easier to treat.  Your doctor or nurse will talk with you about which tests are important for you.  Getting shots for common diseases like the flu and shingles will help prevent them.   01/01/23:  Seen by PCP 7/3, monthly appts with Allergy and Asthma.  Upcoming appt with GYN, sees Dr. Sandria Manly on regular basis  Patient verbalizes understanding of instructions and care plan provided today and agrees to view in MyChart. Active MyChart status and patient understanding of how to access instructions and care plan via MyChart confirmed with patient.     The Managed Medicaid care management team will reach out to the patient again over the next 60 business  days.  The  Patient  has been provided with contact information for the Managed Medicaid care management team and has been advised to call with any health related questions or concerns.   Kathi Der RN, BSN   [facility] Care Management Coordinator - Managed Medicaid High Risk [phone]   Following is a copy of your plan of care:  Care Plan : RN Care Manager Plan of Care  Updates made by Danie Chandler, RN since 01/01/2023 12:00 AM     Problem: Health Promotion or Disease Self-Management (General Plan of Care)  Long-Range Goal: Chronic Disease Management and Care Coordination Needs   Start Date: 02/27/2022  Expected End Date: 03/03/2023  Priority: High  Note:   Current Barriers:  Knowledge Deficits related to plan of care for management of CHF, GERD, asthma, osteoarthritis, constipation, sleep apnea Care Coordination needs related to financial constraints Chronic Disease Management support and education needs related to CHF, GERD, Asthma,osteoarthritis, constipation, sleep apnea. 01/01/23:  Recent PCP and Allergy and Asthma appt.  Has GYN appt next month.  Therapy services  continue.  Medication non compliance. Abdominal pain ? Related to past hernia surgery-? Scar tissue-patient will follow up with provider.  RNCM Clinical Goal(s):  Patient will verbalize understanding of plan for management of CHF, GERD, asthma, osteoarthritis, constipation, sleep apnea as evidenced by patient report verbalize basic understanding of  CHF, GERD, asthma, osteoarthritis, constipation, sleep apnea disease process and self health management plan as evidenced by patient report take all medications exactly as prescribed and will call provider for medication related questions as evidenced by patient report demonstrate understanding of rationale for each prescribed medication as evidenced by patient report attend all scheduled medical appointments as evidenced by patient report continue to work with RN Care Manager to address care management and care coordination needs related to  CHF, GERD, asthma, osteoarthritis, constipation, sleep apnea as evidenced by adherence to CM Team Scheduled appointments through collaboration with RN Care manager, provider, and care team.   Interventions: Inter-disciplinary care team collaboration (see longitudinal plan of care) Evaluation of current treatment plan related to  self management and patient's adherence to plan as established by provider Pharmacy referral for medication review Collaborated with Pharmacy BSW for food and legal resources-completed Collaborated with BSW  Asthma: (Status:New goal.) Long Term Goal Provided patient with basic written and verbal Asthma education on self care/management/and exacerbation prevention Advised patient to track and manage Asthma triggers Provided education about and advised patient to utilize infection prevention strategies to reduce risk of respiratory infection Discussed the importance of adequate rest and management of fatigue with Asthma Assessed social determinant of health barriers   CAD  Interventions: (Status:  New goal.) Long Term Goal Assessed understanding of CAD diagnosis Medications reviewed including medications utilized in CAD treatment plan Provided education on importance of blood pressure control in management of CAD Provided education on Importance of limiting foods high in cholesterol Counseled on importance of regular laboratory monitoring as prescribed Reviewed Importance of taking all medications as prescribed Reviewed Importance of attending all scheduled provider appointments Advised to report any changes in symptoms or exercise tolerance Assessed social determinant of health barriers  Heart Failure Interventions:  (Status:  New goal.) Long Term Goal Basic overview and discussion of pathophysiology of Heart Failure reviewed Discussed the importance of keeping all appointments with provider Assessed social determinant of health barriers     (Status:  New goal.)  Long Term Goal Evaluation of current treatment plan related to  CHF, GERD, asthma, osteoarthritis, constipation, sleep apnea , Transportation and Housing barriers self-management and patient's adherence to plan as established by provider. Discussed plans with patient for ongoing care management follow up and provided patient with direct contact information for care management team Evaluation of current treatment plan related to chronic health conditions  and patient's adherence to plan as established by provider Advised patient to provide appropriate vaccination information to provider or CM team member at next visit Advised patient to contact Legal Aid as a resource Reviewed medications with patient  Provided patient with information about Legal  Aid and transportation Reviewed scheduled/upcoming provider appointments Discussed plans with patient for ongoing care management follow up and provided patient with direct contact information for care management team 03/24/22:  Patient states she contacted  Legal Aid and is currently working with them.  SDOH Barriers (Status:  New goal.) Long Term Goal Patient interviewed and SDOH assessment performed        Patient interviewed and appropriate assessments performed Provided patient with information about Legal Aid and [facility] Transportation services. Discussed plans with patient for ongoing care management follow up and provided patient with direct contact information for care management team  Patient Goals/Self-Care Activities: Take all medications as prescribed Attend all scheduled provider appointments Call pharmacy for medication refills 3-7 days in advance of running out of medications Perform all self care activities independently  Perform IADL's (shopping, preparing meals, housekeeping, managing finances) independently Call provider office for new concerns or questions   Follow Up Plan:  The patient has been provided with contact information for the care management team and has been advised to call with any health related questions or concerns.  The care management team will reach out to the patient again over the next 60  business  days.

## 2023-01-08 ENCOUNTER — Ambulatory Visit: Payer: Medicaid Other

## 2023-01-09 NOTE — Patient Instructions (Incomplete)
  1. Continue Xolair (Epi-Pen)  2. Consistently use the following:   A. Dulera 200 - 2 inhalations 2 times per day  B. Flonase - 1 spray each nostril 2 times per day  C. Famotidine 20 mg - 1 tablet 2 times per day  2.  If needed:   A.  Pro-air HFA  B.  Antihistamine   C.  montelukast 10 mg daily  3. Return to clinic in months or earlier if problem.

## 2023-01-10 ENCOUNTER — Ambulatory Visit: Payer: Medicaid Other

## 2023-01-10 ENCOUNTER — Encounter: Payer: Self-pay | Admitting: Family

## 2023-01-10 ENCOUNTER — Ambulatory Visit (INDEPENDENT_AMBULATORY_CARE_PROVIDER_SITE_OTHER): Payer: Medicaid Other | Admitting: Family

## 2023-01-10 VITALS — BP 112/70 | HR 59 | Temp 98.4°F | Resp 16

## 2023-01-10 DIAGNOSIS — K219 Gastro-esophageal reflux disease without esophagitis: Secondary | ICD-10-CM | POA: Diagnosis not present

## 2023-01-10 DIAGNOSIS — Z91148 Patient's other noncompliance with medication regimen for other reason: Secondary | ICD-10-CM

## 2023-01-10 DIAGNOSIS — J3089 Other allergic rhinitis: Secondary | ICD-10-CM

## 2023-01-10 DIAGNOSIS — J4550 Severe persistent asthma, uncomplicated: Secondary | ICD-10-CM | POA: Diagnosis not present

## 2023-01-10 MED ORDER — SPACER/AERO-HOLDING CHAMBERS DEVI: 0 refills | Status: DC

## 2023-01-10 NOTE — Progress Notes (Signed)
[address] [place] [address] Dept: [phone]  FOLLOW UP NOTE  Patient ID: Kelsey Bullock, female    DOB: 11-06-1960  Age: 62 y.o. MRN: [id number] Date of Office Visit: 01/10/2023  Assessment  Chief Complaint: Follow-up  HPI Kelsey Bullock is a 62 year old female who presents today for follow-up of not well-controlled severe persistent asthma with acute exacerbation, allergic rhinitis, and laryngopharyngeal reflux disease.  She was last seen on December 12, 2022 by Dr. Lucie Leather.  Her sister is here with her today.  She denies any new diagnosis or surgery since her last office visit.  Asthma: She reports that she is still coughing and she is not taking her medications like she should.  She does mention that when she does take her medications such as montelukast and her inhalers they do help.  She reports that she is just hardheaded and dislikes taking medications.  She has used Dulera 200 mcg once since her last appointment rather than 2 puffs twice a day as recommended.  She has also taken montelukast maybe twice since her last appointment.  The last time she used her albuterol was 1 to 2 weeks ago, but she could have potentially used it last night.  She reports a dry cough that she has had ever since being diagnosed with asthma.  She also reports a little bit of tightness in her chest and a little bit of shortness of breath with exertion.  She denies wheezing, nocturnal awakenings due to breathing problems, fever, and chills.  Since her last office visit she has not required any systemic steroids or made any trips to the emergency room or urgent care due to breathing problems.  She does continue to receive Xolair injections per protocol.  She does feel like Xolair injections have helped her asthma.  She denies any problems or reactions.  She did get her EpiPen filled last month.  Allergic rhinitis: She reports clear rhinorrhea, nasal congestion, and postnasal drip all the time.  She  reports that she will have the symptoms if she eats, drinks, or labs.  She has not been treated for any sinus infections since we last.  Laryngopharyngeal reflux disease: She reports reflux symptoms have been acting up some.  She has only taken famotidine maybe once since her last appointment with [place] rather than the 1 tablet twice a day as recommended.   Drug Allergies:  Allergies  Allergen Reactions   Amoxicillin Anaphylaxis and Swelling   Etodolac Anaphylaxis and Shortness Of Breath     lips swelled  lips swelled  lips swelled   Penicillins Anaphylaxis, Swelling and Rash    DID THE REACTION INVOLVE: Swelling of the face/tongue/throat, SOB, or low BP? Yes Sudden or severe rash/hives, skin peeling, or the inside of the mouth or nose? No Did it require medical treatment? No When did it last happen?      20 years ago If all above answers are "NO", may proceed with cephalosporin use.    Latex Rash and Hives   Pantoprazole Sodium Hives    unspecified REACTION: unspecified   Wound Dressing Adhesive Rash   Ampicillin Swelling   Augmentin [Amoxicillin-Pot Clavulanate] Swelling   Diclofenac Sodium     Unknown reaction    Pantoprazole Sodium     unspecified   Adhesive [Tape] Rash    Review of Systems: Negative except as per HPI  Physical Exam: BP 112/70   Pulse (!) 59   Temp 98.4 F (36.9 C)  Resp 16   LMP 02/03/2019 Comment: Tubal Ligation  SpO2 95%    Physical Exam Exam conducted with a chaperone present (sister present).  Constitutional:      Appearance: Normal appearance.  HENT:     Head: Normocephalic and atraumatic.     Comments: Pharynx normal, eyes normal, ears normal, nose: Lateral lower turbinates mildly edematous with no drainage noted    Right Ear: Tympanic membrane, ear canal and external ear normal.     Left Ear: Tympanic membrane, ear canal and external ear normal.     Mouth/Throat:     Mouth: Mucous membranes are moist.     Pharynx: Oropharynx is  clear.  Eyes:     Conjunctiva/sclera: Conjunctivae normal.  Cardiovascular:     Rate and Rhythm: Regular rhythm.     Heart sounds: Normal heart sounds.  Pulmonary:     Effort: Pulmonary effort is normal.     Breath sounds: Normal breath sounds.     Comments: Lungs clear to auscultation Musculoskeletal:     Cervical back: Neck supple.  Skin:    General: Skin is warm.  Neurological:     Mental Status: She is alert and oriented to person, place, and time.  Psychiatric:        Mood and Affect: Mood normal.        Behavior: Behavior normal.        Thought Content: Thought content normal.        Judgment: Judgment normal.     Diagnostics:  None  Assessment and Plan: 1. Not well controlled severe persistent asthma   2. LPRD (laryngopharyngeal reflux disease)   3. Other allergic rhinitis   4. Non compliance w medication regimen     Meds ordered this encounter  Medications   Spacer/Aero-Holding Deretha Emory DEVI    Sig: Use as directed with inhaler.    Dispense:  1 each    Refill:  0    Patient Instructions    1. Continue Xolair (Epi-Pen)  2. Consistently use the following:   A. Dulera 200 - 2 inhalations 2 times per day to help prevent cough and wheeze. Rinse mouth out after. Make sure and use this every day. You can set reminders on your phone or set your [facility] inhaler next to your tooth brush. Discussed risks associated with poorly treated asthma. A prescription for a spacer to use with Elwin Sleight will be sent to your pharmacy  B. Flonase - 1 spray each nostril 2 times per day  C. Famotidine 20 mg - 1 tablet 2 times per day  2.  If needed:   A.  Pro-air HFA  B.  Antihistamine   C.  montelukast 10 mg daily  3. Return to clinic in 2 months or earlier if problem.       Return in about 2 months (around 03/12/2023), or if symptoms worsen or fail to improve.    Thank you for the opportunity to care for this patient.  Please do not hesitate to contact me with  questions.  Nehemiah Settle, FNP [facility]

## 2023-01-23 DIAGNOSIS — Z1211 Encounter for screening for malignant neoplasm of colon: Secondary | ICD-10-CM | POA: Diagnosis not present

## 2023-01-23 DIAGNOSIS — Z01419 Encounter for gynecological examination (general) (routine) without abnormal findings: Secondary | ICD-10-CM | POA: Diagnosis not present

## 2023-01-23 DIAGNOSIS — Z0000 Encounter for general adult medical examination without abnormal findings: Secondary | ICD-10-CM | POA: Diagnosis not present

## 2023-01-29 ENCOUNTER — Other Ambulatory Visit: Payer: Self-pay | Admitting: Family Medicine

## 2023-01-29 ENCOUNTER — Encounter: Payer: Self-pay | Admitting: Family Medicine

## 2023-01-29 DIAGNOSIS — Z1231 Encounter for screening mammogram for malignant neoplasm of breast: Secondary | ICD-10-CM

## 2023-02-01 DIAGNOSIS — Z85038 Personal history of other malignant neoplasm of large intestine: Secondary | ICD-10-CM | POA: Diagnosis not present

## 2023-02-01 DIAGNOSIS — R194 Change in bowel habit: Secondary | ICD-10-CM | POA: Diagnosis not present

## 2023-02-01 DIAGNOSIS — Z08 Encounter for follow-up examination after completed treatment for malignant neoplasm: Secondary | ICD-10-CM | POA: Diagnosis not present

## 2023-02-01 DIAGNOSIS — K219 Gastro-esophageal reflux disease without esophagitis: Secondary | ICD-10-CM | POA: Diagnosis not present

## 2023-02-04 DIAGNOSIS — Z860101 Personal history of adenomatous and serrated colon polyps: Secondary | ICD-10-CM

## 2023-02-07 ENCOUNTER — Ambulatory Visit (INDEPENDENT_AMBULATORY_CARE_PROVIDER_SITE_OTHER): Payer: Medicaid Other

## 2023-02-07 DIAGNOSIS — J4540 Moderate persistent asthma, uncomplicated: Secondary | ICD-10-CM

## 2023-02-07 DIAGNOSIS — J45998 Other asthma: Secondary | ICD-10-CM | POA: Diagnosis not present

## 2023-02-07 NOTE — Progress Notes (Signed)
Patient signed for a new spacer. Patient tried to get one on he last visit, but we were ould of adult spacers.

## 2023-02-12 ENCOUNTER — Telehealth: Payer: Self-pay | Admitting: Allergy and Immunology

## 2023-02-12 NOTE — Telephone Encounter (Signed)
Patient called back and spoke to Vernona Rieger, she stated that the test was negative.

## 2023-02-12 NOTE — Telephone Encounter (Signed)
Called and spoke with the patient and she stated that she has been congested for 2-3 weeks now. She states that she felt better last week and took Elderberry but discontinued and this week she has started to feel wore. No sinus pain or pressure, but she does have a small headache. She denies fever, body aches, or chills. No wheezing, cough, or shortness of breath. I did advise that she may need to take a COVID test to rule out COVID. Any further recommendation?

## 2023-02-12 NOTE — Telephone Encounter (Signed)
Called and spoke to patient and informed her that the COVID recommendation is highly recommended. Patient verbalized understanding and agreed to test and call us back with the results for further instructions.

## 2023-02-12 NOTE — Telephone Encounter (Signed)
Called and left a voicemail asking for patient to return call to discuss. It is strongly recommended for the patient to test herself for COVID first before coming into the office first.

## 2023-02-12 NOTE — Telephone Encounter (Signed)
Patient called in requesting an appt with Dr. Lucie Leather. Patient explained her symptoms as cold/covid symptoms, so she was informed to get tested before coming in office for an appt. Patient refused getting Covid testing but has been informed to get tested before office visit.

## 2023-02-13 ENCOUNTER — Ambulatory Visit: Payer: Medicaid Other | Admitting: Allergy and Immunology

## 2023-02-13 ENCOUNTER — Other Ambulatory Visit: Payer: Self-pay | Admitting: Allergy and Immunology

## 2023-02-13 VITALS — BP 116/82 | HR 72 | Temp 98.3°F | Resp 18 | Ht 63.39 in | Wt 301.3 lb

## 2023-02-13 DIAGNOSIS — J988 Other specified respiratory disorders: Secondary | ICD-10-CM | POA: Diagnosis not present

## 2023-02-13 DIAGNOSIS — J3089 Other allergic rhinitis: Secondary | ICD-10-CM | POA: Diagnosis not present

## 2023-02-13 DIAGNOSIS — J4551 Severe persistent asthma with (acute) exacerbation: Secondary | ICD-10-CM

## 2023-02-13 DIAGNOSIS — B9789 Other viral agents as the cause of diseases classified elsewhere: Secondary | ICD-10-CM

## 2023-02-13 DIAGNOSIS — J4550 Severe persistent asthma, uncomplicated: Secondary | ICD-10-CM | POA: Diagnosis not present

## 2023-02-13 DIAGNOSIS — K219 Gastro-esophageal reflux disease without esophagitis: Secondary | ICD-10-CM

## 2023-02-13 MED ORDER — ALBUTEROL SULFATE (2.5 MG/3ML) 0.083% IN NEBU: 2.5000 mg | INHALATION_SOLUTION | RESPIRATORY_TRACT | 1 refills | Status: DC | PRN

## 2023-02-13 MED ORDER — METHYLPREDNISOLONE ACETATE 40 MG/ML IJ SUSP
40.0000 mg | Freq: Once | INTRAMUSCULAR | Status: AC
Start: 2023-02-13 — End: 2023-02-13
  Administered 2023-02-13: 40 mg via INTRAMUSCULAR

## 2023-02-13 MED ORDER — DULERA 200-5 MCG/ACT IN AERO: 2.0000 | INHALATION_SPRAY | Freq: Two times a day (BID) | RESPIRATORY_TRACT | 5 refills | Status: DC

## 2023-02-13 MED ORDER — MONTELUKAST SODIUM 10 MG PO TABS: 10.0000 mg | ORAL_TABLET | Freq: Every evening | ORAL | 1 refills | Status: DC

## 2023-02-13 MED ORDER — FAMOTIDINE 20 MG PO TABS: 20.0000 mg | ORAL_TABLET | Freq: Every morning | ORAL | 1 refills | Status: DC

## 2023-02-13 MED ORDER — LEVOCETIRIZINE DIHYDROCHLORIDE 5 MG PO TABS: 5.0000 mg | ORAL_TABLET | Freq: Every day | ORAL | 1 refills | Status: DC | PRN

## 2023-02-13 MED ORDER — FLUTICASONE PROPIONATE 50 MCG/ACT NA SUSP: 1.0000 | Freq: Every day | NASAL | 1 refills | Status: DC | PRN

## 2023-02-13 MED ORDER — ALBUTEROL SULFATE HFA 108 (90 BASE) MCG/ACT IN AERS: 2.0000 | INHALATION_SPRAY | RESPIRATORY_TRACT | 1 refills | Status: DC | PRN

## 2023-02-13 NOTE — Patient Instructions (Addendum)
  1. Change Xolair to Tezepelumab injections monthly - Tammy  2. Consistently use the following:   A. Dulera 200 - 2 inhalations 2 times per day  B. Flonase - 1 spray each nostril 2 times per day  C. Famotidine 20 mg - 1 tablet 2 times per day  2.  If needed:   A.  Pro-air HFA  B.  Antihistamine   C.  montelukast 10 mg daily  3. For this recent event:   A. Lots of nasal saline multiple times per day  B. Depomedrol 40 mg IM delivered in clinic today  4. Return to clinic in 12 weeks or earlier if problem. Taper medications???  5. Plan for fall flu vaccine

## 2023-02-13 NOTE — Progress Notes (Unsigned)
[place] - [place] - [place] - [place] - [place]   Follow-up Note  Referring Provider: [name], DO Primary Provider: [name], DO Date of Office Visit: 02/13/2023  Subjective:   Kelsey Bullock (DOB: Jun 30, 1960) is a 62 y.o. female who [facility] in re-evaluation of the following:  HPI: Asiya returns to this clinic in evaluation of asthma, allergic rhinitis, LPR.  I last saw her in this clinic 12 December 2022.  She is visited with our nurse practitioner on 10 January 2023 for respiratory tract flareup.  Approximately 2 weeks ago she developed an issue with sore throat followed by drainage followed by head fullness followed by coughing without any anosmia or headaches or fevers or associated constitutional symptoms.  She had a similar type of issue in June 2024 that required a systemic steroid.  She does not really use her medications on a consistent basis.  She intermittently uses Dulera and Flonase and famotidine and for the most part does not use these medicines at all.  She continues on Xolair injections.  She does use montelukast on occasion as well.  Allergies as of 02/13/2023       Reactions   Amoxicillin Anaphylaxis, Swelling   Etodolac Anaphylaxis, Shortness Of Breath   lips swelled  lips swelled lips swelled,  lips swelled   Penicillins Anaphylaxis, Swelling, Rash   DID THE REACTION INVOLVE: Swelling of the face/tongue/throat, SOB, or low BP? Yes Sudden or severe rash/hives, skin peeling, or the inside of the mouth or nose? No Did it require medical treatment? No When did it last happen?      20 years ago If all above answers are "NO", may proceed with cephalosporin use.   Latex Rash, Hives   Pantoprazole Sodium Hives   unspecified REACTION: unspecified unspecified, REACTION: unspecified   Wound Dressing Adhesive Rash   Ampicillin Swelling   Augmentin [amoxicillin-pot Clavulanate] Swelling    Diclofenac Sodium    Unknown reaction    Pantoprazole Sodium    unspecified   Adhesive [tape] Rash        Medication List    albuterol (2.5 MG/3ML) 0.083% nebulizer solution Commonly known as: PROVENTIL Take 3 mLs (2.5 mg total) by nebulization every 4 (four) hours as needed. USE 1 VIAL VIA NEBULIZER EVERY 4 TO 6 HOURS AS NEEDED FOR COUGH OR WHEEZING   albuterol 108 (90 Base) MCG/ACT inhaler Commonly known as: VENTOLIN HFA Inhale 2 puffs into the lungs every 4 (four) hours as needed.   atorvastatin 20 MG tablet Commonly known as: LIPITOR Take 1 tablet (20 mg total) by mouth daily.   cetirizine 10 MG tablet Commonly known as: ZYRTEC Take 1 tablet (10 mg total) by mouth 2 (two) times daily as needed for allergies (Can take an extra dose for breakthrough symptoms).   clonazePAM 1 MG tablet Commonly known as: KLONOPIN Take 1 mg by mouth as needed.   cyclobenzaprine 10 MG tablet Commonly known as: FLEXERIL Take 1 tablet (10 mg total) by mouth 3 (three) times daily as needed for muscle spasms.   DHS Zinc 2 % Sham Generic drug: Pyrithione Zinc Apply to scalp 3 to 5 times a week as ditreted   diclofenac Sodium 1 % Gel Commonly known as: VOLTAREN Apply 4 g topically 4 (four) times daily.   Dulera 200-5 MCG/ACT Aero Generic drug: mometasone-formoterol Inhale 2 puffs into the lungs in the morning and at bedtime. Take twice daily for  1-2 weeks with respiratory illness or asthma flare.   EPINEPHrine 0.3 mg/0.3 mL Soaj injection Commonly known as: EpiPen 2-Pak Inject 0.3 mg into the muscle as needed for anaphylaxis.   famotidine 20 MG tablet Commonly known as: PEPCID Take 1 tablet (20 mg total) by mouth in the morning.   Fish Oil 1200 MG Caps Take 1 capsule by mouth.   fluticasone 50 MCG/ACT nasal spray Commonly known as: FLONASE Place 1 spray into both nostrils daily as needed for allergies or rhinitis.   ipratropium 0.06 % nasal spray Commonly known as:  ATROVENT Place 2 sprays into both nostrils 4 (four) times daily. USE 2 SPRAYS INTO EACH NOSTRIL EVERY 6 HOURS AS NEEDED FOR TO DRY UP NOSE.   ketoconazole 2 % shampoo Commonly known as: NIZORAL Apply 1 Application topically 2 (two) times a week.   linaclotide 72 MCG capsule Commonly known as: LINZESS Take 2 capsules (144 mcg total) by mouth daily before breakfast.   MG Plus Protein 133 MG Tabs Take 1 tablet by mouth daily.   montelukast 10 MG tablet Commonly known as: SINGULAIR Take 1 tablet (10 mg total) by mouth at bedtime. Take one tablet by mouth at bedtime   Nebulizer Mask Adult Misc 1 Device by Does not apply route as needed.   Olopatadine HCl 0.7 % Soln Apply 1 drop to eye daily.   omalizumab 150 MG/ML prefilled syringe Commonly known as: Xolair Inject 300 mg into the skin every 28 (twenty-eight) days.   Spacer/Aero-Holding [employer] Use as directed with inhaler.   triamcinolone 0.025 % ointment Commonly known as: KENALOG Apply topically 2 (two) times daily.    Past Medical History:  Diagnosis Date   Allergic rhinitis    Anemia    Anxiety    Arthritis    knees and back   Asthma    Complication of anesthesia    Congestive heart failure (CHF) (HCC)    Depression    Gall stone    GERD (gastroesophageal reflux disease)    Headache    migraines, stress headaches   History of kidney stones    Hypertension    Hyperthyroidism    during pregnancy   Pneumonia    PONV (postoperative nausea and vomiting)    Recurrent upper respiratory infection (URI)    Sleep apnea    can't use Cpap    Past Surgical History:  Procedure Laterality Date   ADENOIDECTOMY     APPLICATION OF WOUND VAC  03/18/2019   APPLICATION OF WOUND VAC N/A 03/18/2019   Procedure: Application Of Wound Vac;  Surgeon: Manus Rudd, MD;  Location: [facility] OR;  Service: General;  Laterality: N/A;   BIOPSY  05/27/2018   Procedure: BIOPSY;  Surgeon: Napoleon Form, MD;  Location: [facility]  ENDOSCOPY;  Service: Endoscopy;;   CESAREAN SECTION     COLONOSCOPY WITH PROPOFOL N/A 05/27/2018   Procedure: COLONOSCOPY WITH PROPOFOL;  Surgeon: Napoleon Form, MD;  Location: [facility] ENDOSCOPY;  Service: Endoscopy;  Laterality: N/A;   CYST EXCISION     EXPLORATORY LAPAROTOMY  02/03/2019   INGUINAL HERNIA REPAIR N/A 02/03/2019   Procedure: EXPLORATORY LAPAROTOMY WITH SMALL BOWEL RESECTION AND PRIMARY CLOSURE OF VENTRAL HERNIA;  Surgeon: Manus Rudd, MD;  Location: [facility] OR;  Service: General;  Laterality: N/A;   INSERTION OF MESH N/A 11/25/2020   Procedure: INSERTION OF MESH;  Surgeon: Manus Rudd, MD;  Location: [facility] OR;  Service: General;  Laterality: N/A;   OVARIAN CYST REMOVAL  POLYPECTOMY  05/27/2018   Procedure: POLYPECTOMY;  Surgeon: Napoleon Form, MD;  Location: [facility] ENDOSCOPY;  Service: Endoscopy;;   removal of gallstones     TONSILLECTOMY     TYMPANOSTOMY TUBE PLACEMENT     VENTRAL HERNIA REPAIR N/A 11/25/2020   Procedure: RECURRENT VENTRAL HERNIA REPAIR WITH MESH;  Surgeon: Manus Rudd, MD;  Location: [facility] OR;  Service: General;  Laterality: N/A;   WOUND EXPLORATION  03/18/2019   abdominal   WOUND EXPLORATION N/A 03/18/2019   Procedure: ABDOMINAL WOUND EXPLORATION;  Surgeon: Manus Rudd, MD;  Location: [facility] OR;  Service: General;  Laterality: N/A;    Review of systems negative except as noted in HPI / PMHx or noted below:  Review of Systems  Constitutional: Negative.   HENT: Negative.    Eyes: Negative.   Respiratory: Negative.    Cardiovascular: Negative.   Gastrointestinal: Negative.   Genitourinary: Negative.   Musculoskeletal: Negative.   Skin: Negative.   Neurological: Negative.   Endo/Heme/Allergies: Negative.   Psychiatric/Behavioral: Negative.       Objective:   Vitals:   02/13/23 0911  BP: 116/82  Pulse: 72  Resp: 18  Temp: 98.3 F (36.8 C)  SpO2: 98%   Height: 5' 3.39" (161 cm)  Weight: (!) 301 lb 4.8 oz (136.7 kg)   Physical  Exam Constitutional:      Appearance: She is not diaphoretic.  HENT:     Head: Normocephalic.     Right Ear: Tympanic membrane, ear canal and external ear normal.     Left Ear: Tympanic membrane, ear canal and external ear normal.     Nose: Nose normal. No mucosal edema or rhinorrhea.     Mouth/Throat:     Pharynx: Uvula midline. No oropharyngeal exudate.  Eyes:     Conjunctiva/sclera: Conjunctivae normal.  Neck:     Thyroid: No thyromegaly.     Trachea: Trachea normal. No tracheal tenderness or tracheal deviation.  Cardiovascular:     Rate and Rhythm: Normal rate and regular rhythm.     Heart sounds: Normal heart sounds, S1 normal and S2 normal. No murmur heard. Pulmonary:     Effort: No respiratory distress.     Breath sounds: Normal breath sounds. No stridor. No wheezing or rales.  Lymphadenopathy:     Head:     Right side of head: No tonsillar adenopathy.     Left side of head: No tonsillar adenopathy.     Cervical: No cervical adenopathy.  Skin:    Findings: No erythema or rash.     Nails: There is no clubbing.  Neurological:     Mental Status: She is alert.     Diagnostics: none  Assessment and Plan:   1. Not well controlled severe persistent asthma   2. Other allergic rhinitis   3. LPRD (laryngopharyngeal reflux disease)   4. Viral respiratory infection     1. Change Xolair to Tezepelumab injections monthly - Tammy  2. Consistently use the following:   A. Dulera 200 - 2 inhalations 2 times per day  B. Flonase - 1 spray each nostril 2 times per day  C. Famotidine 20 mg - 1 tablet 2 times per day  2.  If needed:   A.  Pro-air HFA  B.  Antihistamine   C.  montelukast 10 mg daily  3. For this recent event:   A. Lots of nasal saline multiple times per day  B. Depomedrol 40 mg IM delivered in clinic today  4. Return to clinic in 12 weeks or earlier if problem. Taper medications???  5. Plan for fall flu vaccine  Shaquona has required 2 systemic steroids  in 2024 and we are going to change her omalizumab to tezepelumab.  For this recent event we will give her another dose of systemic steroid and she can continue on anti-inflammatory medicines for her airway and therapy directed against reflux as noted above.  Hopefully we can get her an injection of tezepelumab within the next week pending insurance approval.  Laurette Schimke, MD Allergy / Immunology [facility]

## 2023-02-14 ENCOUNTER — Encounter: Payer: Self-pay | Admitting: Allergy and Immunology

## 2023-02-15 ENCOUNTER — Telehealth: Payer: Self-pay | Admitting: *Deleted

## 2023-02-15 MED ORDER — AZITHROMYCIN 500 MG PO TABS: 500.0000 mg | ORAL_TABLET | Freq: Every day | ORAL | 0 refills | Status: DC

## 2023-02-15 NOTE — Telephone Encounter (Signed)
Called patient and advised azithromycin rx to [facility]

## 2023-02-15 NOTE — Telephone Encounter (Signed)
-----   Message from ERIC J KOZLOW sent at 02/14/2023  6:21 AM EDT ----- teze

## 2023-02-15 NOTE — Telephone Encounter (Signed)
Reached out to patient to advise change from Xolair to [facility] and submit to [facility]. Will reach out to patient once delivery set to make next appt. Patient advised she is considerably worse since she saw Dr Lucie Leather on Tuesday. Please advise

## 2023-02-28 ENCOUNTER — Ambulatory Visit
Admission: RE | Admit: 2023-02-28 | Discharge: 2023-02-28 | Disposition: A | Discharge status: Home / Self Care | Payer: Medicaid Other | Source: Ambulatory Visit | Attending: Family Medicine

## 2023-02-28 DIAGNOSIS — Z1231 Encounter for screening mammogram for malignant neoplasm of breast: Secondary | ICD-10-CM | POA: Diagnosis not present

## 2023-03-02 ENCOUNTER — Other Ambulatory Visit: Payer: Medicaid Other | Admitting: Obstetrics and Gynecology

## 2023-03-02 NOTE — Patient Instructions (Signed)
Hi Kelsey Bullock, thanks for updating me today-have a good afternoon!  Kelsey Bullock was given information about Medicaid Managed Care team care coordination services as a part of their [facility] Medicaid benefit. Kelsey Bullock verbally consented to engagement with the [facility] Managed Care team.   If you are experiencing a medical emergency, please call 911 or report to your local emergency department or urgent care.   If you have a non-emergency medical problem during routine business hours, please contact your provider's office and ask to speak with a nurse.   For questions related to your [facility], please call: [phone] or visit the homepage here: [email or web address]  If you would like to schedule transportation through your [facility], please call the following number at least 2 days in advance of your appointment: [phone]   Rides for urgent appointments can also be made after hours by calling Member Services.  Call the Behavioral Health Crisis Line at [phone], at any time, 24 hours a day, 7 days a week. If you are in danger or need immediate medical attention call 911.  If you would like help to quit smoking, call 1-800-QUIT-NOW (313-356-4319) OR Espaol: 1-855-Djelo-Ya (3-474-259-5638) o para ms informacin haga clic aqu or Text READY to 756-433 to register via text  Kelsey Bullock - following are the goals we discussed in your visit today:   Goals Addressed    Timeframe:  Long-Range Goal Priority:  High Start Date:   02/27/22                          Expected End Date:ongoing                       Follow Up Date 04/02/23   - practice safe sex - schedule appointment for flu shot - schedule appointment for vaccines needed due to my age or health - schedule recommended health tests (blood work, mammogram, colonoscopy, pap test) -  schedule and keep appointment for annual check-up    Why is this important?   Screening tests can find diseases early when they are easier to treat.  Your doctor or nurse will talk with you about which tests are important for you.  Getting shots for common diseases like the flu and shingles will help prevent them.   03/02/23:  Colonoscopy Monday.  Recent GYN and Allergy and Asthma appt.  To reschedule eye appt  Patient verbalizes understanding of instructions and care plan provided today and agrees to view in MyChart. Active MyChart status and patient understanding of how to access instructions and care plan via MyChart confirmed with patient.     The Managed Medicaid care management team will reach out to the patient again over the next 30 business  days.  The  Patient has been provided with contact information for the Managed Medicaid care management team and has been advised to call with any health related questions or concerns.   Kathi Der RN, BSN [place]  [facility] Care Management Coordinator - Managed Medicaid High Risk [phone]   Following is a copy of your plan of care:  Care Plan : RN Care Manager Plan of Care  Updates made by Danie Chandler, RN since 03/02/2023 12:00 AM     Problem: Health Promotion or Disease Self-Management (General Plan of Care)      Long-Range Goal: Chronic Disease Management and Care  Coordination Needs   Start Date: 02/27/2022  Expected End Date: 06/01/2023  Priority: High  Note:   Current Barriers:  Knowledge Deficits related to plan of care for management of CHF, GERD, asthma, osteoarthritis, constipation, sleep apnea Care Coordination needs related to financial constraints Chronic Disease Management support and education needs related to CHF, GERD, Asthma,osteoarthritis, constipation, sleep apnea. 03/02/23:  Patient has colonoscopy on Monday.  Discussed financial challenges with housing , meds, etc.  Discussed waiving fees to  get needed medications-h/o medication noncompliance.  BSW appointment scheduled.  Breathing WNL  RNCM Clinical Goal(s):  Patient will verbalize understanding of plan for management of CHF, GERD, asthma, osteoarthritis, constipation, sleep apnea as evidenced by patient report verbalize basic understanding of  CHF, GERD, asthma, osteoarthritis, constipation, sleep apnea disease process and self health management plan as evidenced by patient report take all medications exactly as prescribed and will call provider for medication related questions as evidenced by patient report demonstrate understanding of rationale for each prescribed medication as evidenced by patient report attend all scheduled medical appointments as evidenced by patient report continue to work with RN Care Manager to address care management and care coordination needs related to  CHF, GERD, asthma, osteoarthritis, constipation, sleep apnea as evidenced by adherence to CM Team Scheduled appointments through collaboration with RN Care manager, provider, and care team.   Interventions: Inter-disciplinary care team collaboration (see longitudinal plan of care) Evaluation of current treatment plan related to  self management and patient's adherence to plan as established by provider Pharmacy referral for medication review Collaborated with Pharmacy BSW for housing questions  Collaborated with BSW  Asthma: (Status:New goal.) Long Term Goal Provided patient with basic written and verbal Asthma education on self care/management/and exacerbation prevention Advised patient to track and manage Asthma triggers Provided education about and advised patient to utilize infection prevention strategies to reduce risk of respiratory infection Discussed the importance of adequate rest and management of fatigue with Asthma Assessed social determinant of health barriers   CAD Interventions: (Status:  New goal.) Long Term Goal Assessed  understanding of CAD diagnosis Medications reviewed including medications utilized in CAD treatment plan Provided education on importance of blood pressure control in management of CAD Provided education on Importance of limiting foods high in cholesterol Counseled on importance of regular laboratory monitoring as prescribed Reviewed Importance of taking all medications as prescribed Reviewed Importance of attending all scheduled provider appointments Advised to report any changes in symptoms or exercise tolerance Assessed social determinant of health barriers  Heart Failure Interventions:  (Status:  New goal.) Long Term Goal Basic overview and discussion of pathophysiology of Heart Failure reviewed Discussed the importance of keeping all appointments with provider Assessed social determinant of health barriers     (Status:  New goal.)  Long Term Goal Evaluation of current treatment plan related to  CHF, GERD, asthma, osteoarthritis, constipation, sleep apnea , Transportation and Housing barriers self-management and patient's adherence to plan as established by provider. Discussed plans with patient for ongoing care management follow up and provided patient with direct contact information for care management team Evaluation of current treatment plan related to chronic health conditions  and patient's adherence to plan as established by provider Advised patient to provide appropriate vaccination information to provider or CM team member at next visit Advised patient to contact Legal Aid as a resource Reviewed medications with patient  Provided patient with information about Legal Aid and transportation Reviewed scheduled/upcoming provider appointments Discussed plans with patient for ongoing  care management follow up and provided patient with direct contact information for care management team  SDOH Barriers (Status:  New goal.) Long Term Goal Patient interviewed and SDOH assessment  performed        Patient interviewed and appropriate assessments performed Provided patient with information about Legal Aid and [facility] Transportation services. Discussed plans with patient for ongoing care management follow up and provided patient with direct contact information for care management team  Patient Goals/Self-Care Activities: Take all medications as prescribed Attend all scheduled provider appointments Call pharmacy for medication refills 3-7 days in advance of running out of medications Perform all self care activities independently  Perform IADL's (shopping, preparing meals, housekeeping, managing finances) independently Call provider office for new concerns or questions  03/02/23: To reschedule eye appt.  Follow Up Plan:  The patient has been provided with contact information for the care management team and has been advised to call with any health related questions or concerns.  The care management team will reach out to the patient again over the next 60  business  days.

## 2023-03-02 NOTE — Patient Outreach (Signed)
Medicaid Managed Care   Nurse Care Manager Note  03/02/2023 Name:  Kelsey Bullock MRN:  [id number] DOB:  12/02/1960  Kelsey Bullock is an 62 y.o. year old female who is a primary patient of [name], DO.  [facility] Managed Care Coordination team was consulted for assistance with:    Chronic healthcare management needs, CHF, GERD, asthma, rhinitis, anxiety/depression/PTSD/OCD, OSA, osteoarthritis, LBP, LPRD  Kelsey Bullock was given information about Medicaid Managed Care Coordination team services today. [name] Patient agreed to services and verbal consent obtained.  Engaged with patient by telephone for follow up visit in response to provider referral for case management and/or care coordination services.   Assessments/Interventions:  Review of past medical history, allergies, medications, health status, including review of consultants reports, laboratory and other test data, was performed as part of comprehensive evaluation and provision of chronic care management services.  SDOH (Social Determinants of Health) assessments and interventions performed: SDOH Interventions    Flowsheet Row Patient Outreach Telephone from 03/02/2023 in [place] POPULATION HEALTH DEPARTMENT Patient Outreach Telephone from 01/01/2023 in [place] POPULATION HEALTH DEPARTMENT Patient Outreach Telephone from 12/01/2022 in [place] POPULATION HEALTH DEPARTMENT Patient Outreach Telephone from 08/30/2022 in [place] POPULATION HEALTH DEPARTMENT Patient Outreach Telephone from 08/01/2022 in [place] POPULATION HEALTH DEPARTMENT Patient Outreach Telephone from 07/06/2022 in [place] POPULATION HEALTH DEPARTMENT  SDOH Interventions        Food Insecurity Interventions -- -- --  [BSW gave resources, sister also helps] -- -- --  Housing Interventions -- -- Other (Comment)  [patient given resources, discussed UHC MM? pest control services] -- -- --  Transportation Interventions -- -- --  Intervention Not Indicated -- --  Utilities Interventions Intervention Not Indicated -- -- -- -- --  Alcohol Usage Interventions -- Intervention Not Indicated (Score <7) -- -- -- --  Financial Strain Interventions -- -- -- -- -- Other (Comment)  [patient provided resources]  Physical Activity Interventions -- -- -- -- Intervention Not Indicated --  Stress Interventions --  [receives therapy] -- -- -- -- Other (Comment)  [patient has therapist/counselor and Psychiatrist that she sees]  Social Connections Interventions -- -- -- -- Intervention Not Indicated --  Health Literacy Interventions -- Intervention Not Indicated -- -- -- --     Care Plan Allergies  Allergen Reactions   Amoxicillin Anaphylaxis and Swelling   Etodolac Anaphylaxis and Shortness Of Breath    lips swelled   lips swelled  lips swelled,  lips swelled   Penicillins Anaphylaxis, Swelling and Rash    DID THE REACTION INVOLVE: Swelling of the face/tongue/throat, SOB, or low BP? Yes Sudden or severe rash/hives, skin peeling, or the inside of the mouth or nose? No Did it require medical treatment? No When did it last happen?      20 years ago If all above answers are "NO", may proceed with cephalosporin use.    Latex Rash and Hives   Pantoprazole Sodium Hives    unspecified  REACTION: unspecified  unspecified, REACTION: unspecified   Wound Dressing Adhesive Rash   Ampicillin Swelling   Augmentin [Amoxicillin-Pot Clavulanate] Swelling   Diclofenac Sodium     Unknown reaction    Pantoprazole Sodium     unspecified   Adhesive [Tape] Rash   Medications Reviewed Today     Reviewed by Danie Chandler, RN (Registered Nurse) on 03/02/23 at 1049  Med List Status: <None>   Medication Order Taking? Sig Documenting Provider Last  Dose Status Informant  albuterol (PROVENTIL) (2.5 MG/3ML) 0.083% nebulizer solution [id number]  Take 3 mLs (2.5 mg total) by nebulization every 4 (four) hours as needed. USE 1 VIAL VIA NEBULIZER  EVERY 4 TO 6 HOURS AS NEEDED FOR COUGH OR WHEEZING Kozlow, Alvira Philips, MD  Active   albuterol (VENTOLIN HFA) 108 (90 Base) MCG/ACT inhaler [id number]  Inhale 2 puffs into the lungs every 4 (four) hours as needed. Kozlow, Alvira Philips, MD  Active   atorvastatin (LIPITOR) 20 MG tablet [id number] No Take 1 tablet (20 mg total) by mouth daily. Nestor Ramp, MD Taking Active   azithromycin (ZITHROMAX) 500 MG tablet [id number]  Take 1 tablet (500 mg total) by mouth daily. Kozlow, Alvira Philips, MD  Active   cetirizine (ZYRTEC) 10 MG tablet [id number] No Take 1 tablet (10 mg total) by mouth 2 (two) times daily as needed for allergies (Can take an extra dose for breakthrough symptoms). Kozlow, Alvira Philips, MD Taking Active   clonazePAM (KLONOPIN) 1 MG tablet [id number] No Take 1 mg by mouth as needed. [provider] Taking Active   cyclobenzaprine (FLEXERIL) 10 MG tablet [id number] No Take 1 tablet (10 mg total) by mouth 3 (three) times daily as needed for muscle spasms. Nestor Ramp, MD Taking Active   diclofenac Sodium (VOLTAREN) 1 % GEL [id number] No Apply 4 g topically 4 (four) times daily. Littie Deeds, MD Taking Active   EPINEPHrine (EPIPEN 2-PAK) 0.3 mg/0.3 mL IJ SOAJ injection [id number] No Inject 0.3 mg into the muscle as needed for anaphylaxis. Kozlow, Alvira Philips, MD Taking Active   famotidine (PEPCID) 20 MG tablet [id number]  Take 1 tablet (20 mg total) by mouth in the morning. Kozlow, Alvira Philips, MD  Active   fluticasone [facility]) 50 MCG/ACT nasal spray [id number]  PLACE 1 SPRAY INTO BOTH NOSTRILS AS NEEDED FOR ALERGIES Kozlow, Alvira Philips, MD  Active   ipratropium (ATROVENT) 0.06 % nasal spray [id number] No Place 2 sprays into both nostrils 4 (four) times daily. USE 2 SPRAYS INTO EACH NOSTRIL EVERY 6 HOURS AS NEEDED FOR TO DRY UP NOSE. Verlee Monte, MD Taking Active   ketoconazole (NIZORAL) 2 % shampoo [id number] No Apply 1 Application topically 2 (two) times a week. Nestor Ramp, MD Taking Active   levocetirizine [name]) 5 MG  tablet [id number]  Take 1 tablet (5 mg total) by mouth daily as needed for allergies (Can take an extra dose during flare ups.). Kozlow, Alvira Philips, MD  Active   linaclotide [name]) 72 MCG capsule [id number] No Take 2 capsules (144 mcg total) by mouth daily before breakfast. Moses Manners, MD Taking Active   mometasone-formoterol [facility]) 200-5 MCG/ACT Sandrea Matte [id number]  Inhale 2 puffs into the lungs in the morning and at bedtime. Take twice daily for 1-2 weeks with respiratory illness or asthma flare. Kozlow, Alvira Philips, MD  Active   montelukast (SINGULAIR) 10 MG tablet [id number]  Take 1 tablet (10 mg total) by mouth at bedtime. Take one tablet by mouth at bedtime Kozlow, Alvira Philips, MD  Active   Olopatadine HCl 0.7 % SOLN [id number] No Apply 1 drop to eye daily. [provider] Taking Active   omalizumab Geoffry Paradise) 150 MG/ML prefilled syringe [id number] No Inject 300 mg into the skin every 28 (twenty-eight) days. Kozlow, Alvira Philips, MD Taking Active   omalizumab Geoffry Paradise) injection 300 mg [id number]   Jessica Priest, MD  Active   Omega-3 Fatty Acids (FISH OIL) 1200 MG CAPS [id number] No Take 1  capsule by mouth. [provider] Taking Active   Pyrithione Zinc (DHS ZINC) 2 % SHAM [id number] No Apply to scalp 3 to 5 times a week as ditreted Nestor Ramp, MD Taking Active   Respiratory Therapy Supplies (NEBULIZER MASK ADULT) MISC [id number] No 1 Device by Does not apply route as needed. Jessica Priest, MD Taking Active   Spacer/Aero-Holding Chambers DEVI [id number] No Use as directed with inhaler. Nehemiah Settle, FNP Taking Active   Specialty Vitamins Products (MG PLUS PROTEIN) 133 MG TABS [id number] No Take 1 tablet by mouth daily. [provider] Taking Active   triamcinolone (KENALOG) 0.025 % ointment [id number] No Apply topically 2 (two) times daily. Nestor Ramp, MD Taking Active            Patient Active Problem List   Diagnosis Date Noted   Folliculitis 04/20/2022   Trigger finger of  right thumb 09/06/2021   Primary osteoarthritis of left knee 08/15/2021   Eustachian tube dysfunction, bilateral 07/18/2021   Vaginal pruritus 07/18/2021   Photosensitivity dermatitis 01/06/2021   Recurrent ventral incisional hernia 11/25/2020   Ventral hernia 10/21/2020   Polypharmacy 10/21/2020   Dehydration 04/19/2020   Pap smear for cervical cancer screening 02/25/2020   Constipation 03/22/2019   Hx of adenomatous colonic polyps    Pilonidal cyst 02/22/2018   Diastolic CHF with preserved left ventricular function, NYHA class 2 (HCC) 12/14/2016   GERD (gastroesophageal reflux disease) 02/06/2015   Severe persistent asthma 02/06/2015   Intertrigo 10/22/2014   Depression, major, recurrent (HCC) 02/19/2014   Sleep apnea 03/10/2009   Seborrheic eczema 07/17/2007   LOW BACK PAIN SYNDROME 11/26/2006   HYPERCHOLESTEROLEMIA 08/02/2006   Morbid obesity (HCC) 08/02/2006   Anxiety state 08/02/2006   PSEUDOTUMOR CEREBRI 08/02/2006   OSTEOARTHRITIS, LOWER LEG 08/02/2006   FIBROMYALGIA, FIBROMYOSITIS 08/02/2006   Conditions to be addressed/monitored per PCP order:  Chronic healthcare management needs, CHF, GERD, asthma, rhinitis, anxiety/depression/PTSD/OCD, OSA, osteoarthritis, LBP, LPRD  Care Plan : RN Care Manager Plan of Care  Updates made by Danie Chandler, RN since 03/02/2023 12:00 AM     Problem: Health Promotion or Disease Self-Management (General Plan of Care)      Long-Range Goal: Chronic Disease Management and Care Coordination Needs   Start Date: 02/27/2022  Expected End Date: 06/01/2023  Priority: High  Note:   Current Barriers:  Knowledge Deficits related to plan of care for management of CHF, GERD, asthma, osteoarthritis, constipation, sleep apnea Care Coordination needs related to financial constraints Chronic Disease Management support and education needs related to CHF, GERD, Asthma,osteoarthritis, constipation, sleep apnea. 03/02/23:  Patient has colonoscopy on  Monday.  Discussed financial challenges with housing , meds, etc.  Discussed waiving fees to get needed medications-h/o medication noncompliance.  BSW appointment scheduled.  Breathing WNL  RNCM Clinical Goal(s):  Patient will verbalize understanding of plan for management of CHF, GERD, asthma, osteoarthritis, constipation, sleep apnea as evidenced by patient report verbalize basic understanding of  CHF, GERD, asthma, osteoarthritis, constipation, sleep apnea disease process and self health management plan as evidenced by patient report take all medications exactly as prescribed and will call provider for medication related questions as evidenced by patient report demonstrate understanding of rationale for each prescribed medication as evidenced by patient report attend all scheduled medical appointments as evidenced by patient report continue to work with RN Care Manager to address care management and care coordination needs related to  CHF, GERD, asthma, osteoarthritis, constipation, sleep apnea as evidenced by  adherence to CM Team Scheduled appointments through collaboration with RN Care manager, provider, and care team.   Interventions: Inter-disciplinary care team collaboration (see longitudinal plan of care) Evaluation of current treatment plan related to  self management and patient's adherence to plan as established by provider Pharmacy referral for medication review Collaborated with Pharmacy BSW for housing questions  Collaborated with BSW  Asthma: (Status:New goal.) Long Term Goal Provided patient with basic written and verbal Asthma education on self care/management/and exacerbation prevention Advised patient to track and manage Asthma triggers Provided education about and advised patient to utilize infection prevention strategies to reduce risk of respiratory infection Discussed the importance of adequate rest and management of fatigue with Asthma Assessed social determinant of  health barriers   CAD Interventions: (Status:  New goal.) Long Term Goal Assessed understanding of CAD diagnosis Medications reviewed including medications utilized in CAD treatment plan Provided education on importance of blood pressure control in management of CAD Provided education on Importance of limiting foods high in cholesterol Counseled on importance of regular laboratory monitoring as prescribed Reviewed Importance of taking all medications as prescribed Reviewed Importance of attending all scheduled provider appointments Advised to report any changes in symptoms or exercise tolerance Assessed social determinant of health barriers  Heart Failure Interventions:  (Status:  New goal.) Long Term Goal Basic overview and discussion of pathophysiology of Heart Failure reviewed Discussed the importance of keeping all appointments with provider Assessed social determinant of health barriers     (Status:  New goal.)  Long Term Goal Evaluation of current treatment plan related to  CHF, GERD, asthma, osteoarthritis, constipation, sleep apnea , Transportation and Housing barriers self-management and patient's adherence to plan as established by provider. Discussed plans with patient for ongoing care management follow up and provided patient with direct contact information for care management team Evaluation of current treatment plan related to chronic health conditions  and patient's adherence to plan as established by provider Advised patient to provide appropriate vaccination information to provider or CM team member at next visit Advised patient to contact Legal Aid as a resource Reviewed medications with patient  Provided patient with information about Legal Aid and transportation Reviewed scheduled/upcoming provider appointments Discussed plans with patient for ongoing care management follow up and provided patient with direct contact information for care management team  SDOH Barriers  (Status:  New goal.) Long Term Goal Patient interviewed and SDOH assessment performed        Patient interviewed and appropriate assessments performed Provided patient with information about Legal Aid and [facility] Transportation services. Discussed plans with patient for ongoing care management follow up and provided patient with direct contact information for care management team  Patient Goals/Self-Care Activities: Take all medications as prescribed Attend all scheduled provider appointments Call pharmacy for medication refills 3-7 days in advance of running out of medications Perform all self care activities independently  Perform IADL's (shopping, preparing meals, housekeeping, managing finances) independently Call provider office for new concerns or questions  03/02/23: To reschedule eye appt.  Follow Up Plan:  The patient has been provided with contact information for the care management team and has been advised to call with any health related questions or concerns.  The care management team will reach out to the patient again over the next 60  business  days.     Long-Range Goal: Establish Plan of Care for Chronic Disease Management Needs   Priority: High  Note:   Timeframe:  Long-Range Goal Priority:  High Start Date:   02/27/22                          Expected End Date:ongoing                       Follow Up Date 04/02/23   - practice safe sex - schedule appointment for flu shot - schedule appointment for vaccines needed due to my age or health - schedule recommended health tests (blood work, mammogram, colonoscopy, pap test) - schedule and keep appointment for annual check-up    Why is this important?   Screening tests can find diseases early when they are easier to treat.  Your doctor or nurse will talk with you about which tests are important for you.  Getting shots for common diseases like the flu and shingles will help prevent them.   03/02/23:  Colonoscopy Monday.   Recent GYN and Allergy and Asthma appt.  To reschedule eye appt   Follow Up:  Patient agrees to Care Plan and Follow-up.  Plan: The Managed Medicaid care management team will reach out to the patient again over the next 30 business  days. and The  Patient has been provided with contact information for the Managed Medicaid care management team and has been advised to call with any health related questions or concerns.  Date/time of next scheduled RN care management/care coordination outreach: 04/02/23 at 1030

## 2023-03-05 DIAGNOSIS — I110 Hypertensive heart disease with heart failure: Secondary | ICD-10-CM | POA: Diagnosis not present

## 2023-03-05 DIAGNOSIS — K648 Other hemorrhoids: Secondary | ICD-10-CM | POA: Diagnosis not present

## 2023-03-05 DIAGNOSIS — K2970 Gastritis, unspecified, without bleeding: Secondary | ICD-10-CM | POA: Diagnosis not present

## 2023-03-05 DIAGNOSIS — K219 Gastro-esophageal reflux disease without esophagitis: Secondary | ICD-10-CM | POA: Diagnosis not present

## 2023-03-05 DIAGNOSIS — I509 Heart failure, unspecified: Secondary | ICD-10-CM | POA: Diagnosis not present

## 2023-03-05 DIAGNOSIS — K91840 Postprocedural hemorrhage and hematoma of a digestive system organ or structure following a digestive system procedure: Secondary | ICD-10-CM | POA: Diagnosis not present

## 2023-03-05 DIAGNOSIS — Z8711 Personal history of peptic ulcer disease: Secondary | ICD-10-CM | POA: Diagnosis not present

## 2023-03-05 DIAGNOSIS — J45909 Unspecified asthma, uncomplicated: Secondary | ICD-10-CM | POA: Diagnosis not present

## 2023-03-05 DIAGNOSIS — Z85038 Personal history of other malignant neoplasm of large intestine: Secondary | ICD-10-CM | POA: Diagnosis not present

## 2023-03-05 DIAGNOSIS — Z1211 Encounter for screening for malignant neoplasm of colon: Secondary | ICD-10-CM | POA: Diagnosis not present

## 2023-03-05 DIAGNOSIS — R194 Change in bowel habit: Secondary | ICD-10-CM | POA: Diagnosis not present

## 2023-03-05 DIAGNOSIS — Z08 Encounter for follow-up examination after completed treatment for malignant neoplasm: Secondary | ICD-10-CM | POA: Diagnosis not present

## 2023-03-05 DIAGNOSIS — E119 Type 2 diabetes mellitus without complications: Secondary | ICD-10-CM | POA: Diagnosis not present

## 2023-03-07 ENCOUNTER — Ambulatory Visit: Payer: Medicaid Other | Admitting: *Deleted

## 2023-03-07 ENCOUNTER — Other Ambulatory Visit: Payer: Medicaid Other

## 2023-03-07 DIAGNOSIS — J4550 Severe persistent asthma, uncomplicated: Secondary | ICD-10-CM | POA: Diagnosis not present

## 2023-03-07 MED ORDER — TEZEPELUMAB-EKKO 210 MG/1.91ML ~~LOC~~ SOSY
210.0000 mg | PREFILLED_SYRINGE | SUBCUTANEOUS | Status: AC
Start: 2023-03-07 — End: ?
  Administered 2023-03-07 – 2024-07-11 (×18): 210 mg via SUBCUTANEOUS

## 2023-03-07 NOTE — Patient Outreach (Signed)
Medicaid Managed Care Social Work Note  03/07/2023 Name:  Kelsey Bullock MRN:  [id number] DOB:  Aug 28, 1960  Kelsey Bullock is an 62 y.o. year old female who is a primary patient of Kelsey Laroche, DO.  The Medicaid Managed Care Coordination team was consulted for assistance with:  Community Resources   Kelsey Bullock was given information about Medicaid Managed Care Coordination team services today. Kelsey Bullock Patient agreed to services and verbal consent obtained.  Engaged with patient  for by telephone forinitial visit in response to referral for case management and/or care coordination services.   Assessments/Interventions:  Review of past medical history, allergies, medications, health status, including review of consultants reports, laboratory and other test data, was performed as part of comprehensive evaluation and provision of chronic care management services.  SDOH: (Social Determinant of Health) assessments and interventions performed: SDOH Interventions    Flowsheet Row Patient Outreach Telephone from 03/02/2023 in [place] POPULATION HEALTH DEPARTMENT Patient Outreach Telephone from 01/01/2023 in [place] POPULATION HEALTH DEPARTMENT Patient Outreach Telephone from 12/01/2022 in [place] POPULATION HEALTH DEPARTMENT Patient Outreach Telephone from 08/30/2022 in [place] POPULATION HEALTH DEPARTMENT Patient Outreach Telephone from 08/01/2022 in [place] POPULATION HEALTH DEPARTMENT Patient Outreach Telephone from 07/06/2022 in  POPULATION HEALTH DEPARTMENT  SDOH Interventions        Food Insecurity Interventions -- -- --  [BSW gave resources, sister also helps] -- -- --  Housing Interventions -- -- Other (Comment)  [patient given resources, discussed UHC MM? pest control services] -- -- --  Transportation Interventions -- -- -- Intervention Not Indicated -- --  Utilities Interventions Intervention Not Indicated -- -- -- -- --  Alcohol Usage  Interventions -- Intervention Not Indicated (Score <7) -- -- -- --  Financial Strain Interventions -- -- -- -- -- Other (Comment)  [patient provided resources]  Physical Activity Interventions -- -- -- -- Intervention Not Indicated --  Stress Interventions --  [receives therapy] -- -- -- -- Other (Comment)  [patient has therapist/counselor and Psychiatrist that she sees]  Social Connections Interventions -- -- -- -- Intervention Not Indicated --  Health Literacy Interventions -- Intervention Not Indicated -- -- -- --     BSW completed a telephone outreach with patient, she states she did not pay her rent for a while and owes 2500 to the apartment complex. Patient states she receives 749 in social security each month, she does receive foodstamps. BSW contacted [employer] to get an exact amount of how much patient owes but was unable to speak with anyone. BSW will send patient resources for utilities and food.   Advanced Directives Status:  Not addressed in this encounter.  Care Plan                 Allergies  Allergen Reactions   Amoxicillin Anaphylaxis and Swelling   Etodolac Anaphylaxis and Shortness Of Breath    lips swelled   lips swelled  lips swelled,  lips swelled   Penicillins Anaphylaxis, Swelling and Rash    DID THE REACTION INVOLVE: Swelling of the face/tongue/throat, SOB, or low BP? Yes Sudden or severe rash/hives, skin peeling, or the inside of the mouth or nose? No Did it require medical treatment? No When did it last happen?      20 years ago If all above answers are "NO", may proceed with cephalosporin use.    Latex Rash and Hives   Pantoprazole Sodium Hives    unspecified  REACTION:  unspecified  unspecified, REACTION: unspecified   Wound Dressing Adhesive Rash   Ampicillin Swelling   Augmentin [Amoxicillin-Pot Clavulanate] Swelling   Diclofenac Sodium     Unknown reaction    Pantoprazole Sodium     unspecified   Adhesive [Tape] Rash    Medications  Reviewed Today   Medications were not reviewed in this encounter     Patient Active Problem List   Diagnosis Date Noted   Folliculitis 04/20/2022   Trigger finger of right thumb 09/06/2021   Primary osteoarthritis of left knee 08/15/2021   Eustachian tube dysfunction, bilateral 07/18/2021   Vaginal pruritus 07/18/2021   Photosensitivity dermatitis 01/06/2021   Recurrent ventral incisional hernia 11/25/2020   Ventral hernia 10/21/2020   Polypharmacy 10/21/2020   Dehydration 04/19/2020   Pap smear for cervical cancer screening 02/25/2020   Constipation 03/22/2019   Hx of adenomatous colonic polyps    Pilonidal cyst 02/22/2018   Diastolic CHF with preserved left ventricular function, NYHA class 2 (HCC) 12/14/2016   GERD (gastroesophageal reflux disease) 02/06/2015   Severe persistent asthma 02/06/2015   Intertrigo 10/22/2014   Depression, major, recurrent (HCC) 02/19/2014   Sleep apnea 03/10/2009   Seborrheic eczema 07/17/2007   LOW BACK PAIN SYNDROME 11/26/2006   HYPERCHOLESTEROLEMIA 08/02/2006   Morbid obesity (HCC) 08/02/2006   Anxiety state 08/02/2006   PSEUDOTUMOR CEREBRI 08/02/2006   OSTEOARTHRITIS, LOWER LEG 08/02/2006   FIBROMYALGIA, FIBROMYOSITIS 08/02/2006    Conditions to be addressed/monitored per PCP order:   community resources  There are no care plans that you recently modified to display for this patient.   Follow up:  Patient agrees to Care Plan and Follow-up.  Plan: The Managed Medicaid care management team will reach out to the patient again over the next 30 days.  Date/time of next scheduled Social Work care management/care coordination outreach:  04/09/23  Kelsey Bullock, Kelsey Bullock, [facility]  Managed [facility] Social Worker [phone]

## 2023-03-07 NOTE — Patient Instructions (Signed)
Visit Information  [name] was given information about Medicaid Managed Care team care coordination services as a part of their [facility] Community Plan Medicaid benefit. [name] verbally consented to engagement with the [facility] Managed Care team.   If you are experiencing a medical emergency, please call 911 or report to your local emergency department or urgent care.   If you have a non-emergency medical problem during routine business hours, please contact your provider's office and ask to speak with a nurse.   For questions related to your [facility], please call: [phone] or visit the homepage here: [email or web address]  If you would like to schedule transportation through your [facility], please call the following number at least 2 days in advance of your appointment: [phone]   Rides for urgent appointments can also be made after hours by calling Member Services.  Call the Behavioral Health Crisis Line at [phone], at any time, 24 hours a day, 7 days a week. If you are in danger or need immediate medical attention call 911.  If you would like help to quit smoking, call 1-800-QUIT-NOW (412-183-1182) OR Espaol: 1-855-Djelo-Ya (1-324-401-0272) o para ms informacin haga clic aqu or Text READY to 536-644 to register via text  Kelsey Bullock - following are the goals we discussed in your visit today:   Goals Addressed   None       Social Worker will follow up in 30 days.   Gus Puma, Kenard Gower, MHA [facility]  Managed Medicaid Social Worker [id number]   Following is a copy of your plan of care:  There are no care plans that you recently modified to display for this patient.

## 2023-03-07 NOTE — Progress Notes (Addendum)
Immunotherapy   Patient Details  Name: Kelsey Bullock MRN: [id number] Date of Birth: [birth year]  03/07/2023  [name] started injections for  Tezspire  Frequency: Every 28 days Epi-Pen: Not Required Consent signed and patient instructions given. Patient started Tezspire today and received 1.17mL in the LUA. Patient waited 30 minutes in office and experienced some burning and itching at the injection site and some numbness and pins and needles in her arm.  Vitals were obtained and Chrissie observed her O2 98, P 62, R 18, BP 120/62 Observed 45 minutes  Vitals O2 97 P 67 R 18 BP 108/60 Chrissie observed the patient and she was in stable condition when she left the office. No more numbness or tingling in her arm/hand/wrist. Just burning and itching at the inj. Site, no visible redness or irritation at the inj. Site.   Bonham Zingale Fernandez-Vernon 03/07/2023, 9:20 AM

## 2023-03-13 ENCOUNTER — Other Ambulatory Visit: Payer: Self-pay

## 2023-03-13 NOTE — Patient Outreach (Signed)
BSW receieved a voicemail from patient. BSW contacted patient and she stated her cousin that works at [employer] fixed her brakes and the cost is $481.00 patient states her car is still there because she does not have the funds to pick it up. Patient states she owes [phone] to [facility] crossing for late fees accrued while she was in the hospital. Patient is requesting assistance for both.    Abelino Derrick, MHA [facility]  Managed [facility] Social Worker [phone]

## 2023-03-19 ENCOUNTER — Other Ambulatory Visit: Payer: Medicaid Other | Admitting: Obstetrics and Gynecology

## 2023-03-19 NOTE — Patient Outreach (Signed)
Care Coordination  03/19/2023  SHELLBY SCHLINK April 11, 1961 [id number]  RNCM returned patient's phone call.  Patient upset, son tried to commit suicide last week-currently at [facility].  Emotional support and active listening provided.  Patient has car now.  RNCM and Managed Medicaid team will continue to follow.  Kathi Der RN, BSN [place]  [employer] - Managed Medicaid High Risk [phone]

## 2023-04-02 ENCOUNTER — Other Ambulatory Visit: Payer: Self-pay | Admitting: Obstetrics and Gynecology

## 2023-04-02 NOTE — Patient Instructions (Signed)
Hi Ms. Konishi, thank you for the updates-I hope you have a good week!  Ms. Twiford was given information about Medicaid Managed Care team care coordination services as a part of their [facility] Community Plan Medicaid benefit. HILDE CASHMORE verbally consented to engagement with the [facility] Managed Care team.   If you are experiencing a medical emergency, please call 911 or report to your local emergency department or urgent care.   If you have a non-emergency medical problem during routine business hours, please contact your provider's office and ask to speak with a nurse.   For questions related to your [facility], please call: [phone] or visit the homepage here: [email or web address]  If you would like to schedule transportation through your [facility], please call the following number at least 2 days in advance of your appointment: [phone]   Rides for urgent appointments can also be made after hours by calling Member Services.  Call the Behavioral Health Crisis Line at [phone], at any time, 24 hours a day, 7 days a week. If you are in danger or need immediate medical attention call 911.  If you would like help to quit smoking, call 1-800-QUIT-NOW ((616) 859-9537) OR Espaol: 1-855-Djelo-Ya (7-253-664-4034) o para ms informacin haga clic aqu or Text READY to 742-595 to register via text  Ms. Mikulecky - following are the goals we discussed in your visit today:   Goals Addressed    Timeframe:  Long-Range Goal Priority:  High Start Date:   02/27/22                          Expected End Date:ongoing                       Follow Up Date 05/02/23   - practice safe sex - schedule appointment for flu shot - schedule appointment for vaccines needed due to my age or health - schedule recommended health tests (blood work, mammogram, colonoscopy, pap  test) - schedule and keep appointment for annual check-up    Why is this important?   Screening tests can find diseases early when they are easier to treat.  Your doctor or nurse will talk with you about which tests are important for you.  Getting shots for common diseases like the flu and shingles will help prevent them.   04/02/23:  Colonoscopy and Endoscopy completed.  Has eye appt in Nov or Dec per patient.  PCP 10/31.  Patient verbalizes understanding of instructions and care plan provided today and agrees to view in MyChart. Active MyChart status and patient understanding of how to access instructions and care plan via MyChart confirmed with patient.     The Managed Medicaid care management team will reach out to the patient again over the next 30 business  days.  The  Patient  has been provided with contact information for the Managed Medicaid care management team and has been advised to call with any health related questions or concerns.   Kathi Der RN, BSN [place]  [facility] Care Management Coordinator - Managed Medicaid High Risk [phone]   Following is a copy of your plan of care:  Care Plan : RN Care Manager Plan of Care  Updates made by Danie Chandler, RN since 04/02/2023 12:00 AM     Problem: Health Promotion or Disease Self-Management (General Plan of Care)      Long-Range  Goal: Chronic Disease Management and Care Coordination Needs   Start Date: 02/27/2022  Expected End Date: 06/01/2023  Priority: High  Note:   Current Barriers:  Knowledge Deficits related to plan of care for management of CHF, GERD, asthma, osteoarthritis, constipation, sleep apnea Care Coordination needs related to financial constraints Chronic Disease Management support and education needs related to CHF, GERD, Asthma,osteoarthritis, constipation, sleep apnea. 04/02/23:  Colonoscopy/Endoscopy completed 9/30-has f/u appt.  Son doing better.  Breathing okay. Main issue  right now is housing-working with [employer].  F/U appt scheduled with BSW.  Eye appt scheduled.  RNCM Clinical Goal(s):  Patient will verbalize understanding of plan for management of CHF, GERD, asthma, osteoarthritis, constipation, sleep apnea as evidenced by patient report verbalize basic understanding of  CHF, GERD, asthma, osteoarthritis, constipation, sleep apnea disease process and self health management plan as evidenced by patient report take all medications exactly as prescribed and will call provider for medication related questions as evidenced by patient report demonstrate understanding of rationale for each prescribed medication as evidenced by patient report attend all scheduled medical appointments as evidenced by patient report continue to work with RN Care Manager to address care management and care coordination needs related to  CHF, GERD, asthma, osteoarthritis, constipation, sleep apnea as evidenced by adherence to CM Team Scheduled appointments through collaboration with RN Care manager, provider, and care team.   Interventions: Inter-disciplinary care team collaboration (see longitudinal plan of care) Evaluation of current treatment plan related to  self management and patient's adherence to plan as established by provider Pharmacy referral for medication review Collaborated with Pharmacy BSW for housing questions  Collaborated with BSW 03/19/23:  RNCM returned patient's phone call. Patient upset, son tried to commit suicide last week-currently at [facility]. Emotional support and active listening provided. Patient has car now. RNCM and Managed Medicaid team will continue to follow.  04/02/23:  F/U BSW appt scheduled for this week as patient would like to talk to BSW.  Asthma: (Status:New goal.) Long Term Goal Provided patient with basic written and verbal Asthma education on self care/management/and exacerbation prevention Advised patient to track and manage Asthma  triggers Provided education about and advised patient to utilize infection prevention strategies to reduce risk of respiratory infection Discussed the importance of adequate rest and management of fatigue with Asthma Assessed social determinant of health barriers   CAD Interventions: (Status:  New goal.) Long Term Goal Assessed understanding of CAD diagnosis Medications reviewed including medications utilized in CAD treatment plan Provided education on importance of blood pressure control in management of CAD Provided education on Importance of limiting foods high in cholesterol Counseled on importance of regular laboratory monitoring as prescribed Reviewed Importance of taking all medications as prescribed Reviewed Importance of attending all scheduled provider appointments Advised to report any changes in symptoms or exercise tolerance Assessed social determinant of health barriers  Heart Failure Interventions:  (Status:  New goal.) Long Term Goal Basic overview and discussion of pathophysiology of Heart Failure reviewed Discussed the importance of keeping all appointments with provider Assessed social determinant of health barriers     (Status:  New goal.)  Long Term Goal Evaluation of current treatment plan related to  CHF, GERD, asthma, osteoarthritis, constipation, sleep apnea , Transportation and Housing barriers self-management and patient's adherence to plan as established by provider. Discussed plans with patient for ongoing care management follow up and provided patient with direct contact information for care management team Evaluation of current treatment plan related to  chronic health conditions  and patient's adherence to plan as established by provider Advised patient to provide appropriate vaccination information to provider or CM team member at next visit Advised patient to contact [employer] as a resource Reviewed medications with patient  Provided patient with  information about [employer] and transportation Reviewed scheduled/upcoming provider appointments Discussed plans with patient for ongoing care management follow up and provided patient with direct contact information for care management team  SDOH Barriers (Status:  New goal.) Long Term Goal Patient interviewed and SDOH assessment performed        Patient interviewed and appropriate assessments performed Provided patient with information about [employer] and [facility] Transportation services. Discussed plans with patient for ongoing care management follow up and provided patient with direct contact information for care management team  Patient Goals/Self-Care Activities: Take all medications as prescribed Attend all scheduled provider appointments Call pharmacy for medication refills 3-7 days in advance of running out of medications Perform all self care activities independently  Perform IADL's (shopping, preparing meals, housekeeping, managing finances) independently Call provider office for new concerns or questions  03/02/23: To reschedule eye appt-completed  Follow Up Plan:  The patient has been provided with contact information for the care management team and has been advised to call with any health related questions or concerns.  The care management team will reach out to the patient again over the next 30  business  days.

## 2023-04-02 NOTE — Patient Outreach (Signed)
Medicaid Managed Care   Nurse Care Manager Note  04/02/2023 Name:  Kelsey Bullock MRN:  [id number] DOB:  Dec 17, 1960  Kelsey Bullock is an 62 y.o. year old female who is a primary patient of [name], DO.  [facility] Managed Care Coordination team was consulted for assistance with:    Chronic healthcare management needs, CHF, GERD, asthma, rhinitis, anxiety/depression/PTSD/OCD, OSA, osteoarthritis, LBP, LPRD, SDOH  Ms. Crivelli was given information about Medicaid Managed Care Coordination team services today. [name] Patient agreed to services and verbal consent obtained.  Engaged with patient by telephone for follow up visit in response to provider referral for case management and/or care coordination services.   Assessments/Interventions:  Review of past medical history, allergies, medications, health status, including review of consultants reports, laboratory and other test data, was performed as part of comprehensive evaluation and provision of chronic care management services.  SDOH (Social Determinants of Health) assessments and interventions performed: SDOH Interventions    Flowsheet Row Patient Outreach Telephone from 04/02/2023 in [place] POPULATION HEALTH DEPARTMENT Patient Outreach Telephone from 03/19/2023 in [place] POPULATION HEALTH DEPARTMENT Patient Outreach Telephone from 03/02/2023 in [place] POPULATION HEALTH DEPARTMENT Patient Outreach Telephone from 01/01/2023 in [place] POPULATION HEALTH DEPARTMENT Patient Outreach Telephone from 12/01/2022 in [place] POPULATION HEALTH DEPARTMENT Patient Outreach Telephone from 08/30/2022 in  POPULATION HEALTH DEPARTMENT  SDOH Interventions        Food Insecurity Interventions -- -- -- -- --  [BSW gave resources, sister also helps] --  Housing Interventions -- -- -- -- Other (Comment)  [patient given resources, discussed UHC MM? pest control services] --  Transportation Interventions  -- -- -- -- -- Intervention Not Indicated  Utilities Interventions -- -- Intervention Not Indicated -- -- --  Alcohol Usage Interventions -- -- -- Intervention Not Indicated (Score <7) -- --  Financial Strain Interventions -- Other (Comment)  [patient has been provided resources] -- -- -- --  Physical Activity Interventions Intervention Not Indicated -- -- -- -- --  Stress Interventions -- -- --  [receives therapy] -- -- --  Social Connections Interventions -- Intervention Not Indicated -- -- -- --  Health Literacy Interventions -- -- -- Intervention Not Indicated -- --     Care Plan Allergies  Allergen Reactions   Amoxicillin Anaphylaxis and Swelling   Etodolac Anaphylaxis and Shortness Of Breath    lips swelled   lips swelled  lips swelled,  lips swelled   Penicillins Anaphylaxis, Swelling and Rash    DID THE REACTION INVOLVE: Swelling of the face/tongue/throat, SOB, or low BP? Yes Sudden or severe rash/hives, skin peeling, or the inside of the mouth or nose? No Did it require medical treatment? No When did it last happen?      20 years ago If all above answers are "NO", may proceed with cephalosporin use.    Latex Rash and Hives   Pantoprazole Sodium Hives    unspecified  REACTION: unspecified  unspecified, REACTION: unspecified   Wound Dressing Adhesive Rash   Ampicillin Swelling   Augmentin [Amoxicillin-Pot Clavulanate] Swelling   Diclofenac Sodium     Unknown reaction    Pantoprazole Sodium     unspecified   Adhesive [Tape] Rash   Medications Reviewed Today     Reviewed by Danie Chandler, RN (Registered Nurse) on 04/02/23 at 1047  Med List Status: <None>   Medication Order Taking? Sig Documenting Provider Last Dose Status Informant  albuterol (PROVENTIL) (2.5  MG/3ML) 0.083% nebulizer solution [id number]  Take 3 mLs (2.5 mg total) by nebulization every 4 (four) hours as needed. USE 1 VIAL VIA NEBULIZER EVERY 4 TO 6 HOURS AS NEEDED FOR COUGH OR WHEEZING Kozlow, Alvira Philips, MD  Active   albuterol (VENTOLIN HFA) 108 (90 Base) MCG/ACT inhaler [id number]  Inhale 2 puffs into the lungs every 4 (four) hours as needed. Kozlow, Alvira Philips, MD  Active   atorvastatin (LIPITOR) 20 MG tablet [id number] No Take 1 tablet (20 mg total) by mouth daily. Nestor Ramp, MD Taking Active   azithromycin (ZITHROMAX) 500 MG tablet [id number]  Take 1 tablet (500 mg total) by mouth daily. Kozlow, Alvira Philips, MD  Active   cetirizine (ZYRTEC) 10 MG tablet [id number] No Take 1 tablet (10 mg total) by mouth 2 (two) times daily as needed for allergies (Can take an extra dose for breakthrough symptoms). Kozlow, Alvira Philips, MD Taking Active   clonazePAM (KLONOPIN) 1 MG tablet [id number] No Take 1 mg by mouth as needed. [provider] Taking Active   cyclobenzaprine (FLEXERIL) 10 MG tablet [id number] No Take 1 tablet (10 mg total) by mouth 3 (three) times daily as needed for muscle spasms. Nestor Ramp, MD Taking Active   diclofenac Sodium (VOLTAREN) 1 % GEL [id number] No Apply 4 g topically 4 (four) times daily. Littie Deeds, MD Taking Active   EPINEPHrine (EPIPEN 2-PAK) 0.3 mg/0.3 mL IJ SOAJ injection [id number] No Inject 0.3 mg into the muscle as needed for anaphylaxis. Kozlow, Alvira Philips, MD Taking Active   famotidine (PEPCID) 20 MG tablet [id number]  Take 1 tablet (20 mg total) by mouth in the morning. Kozlow, Alvira Philips, MD  Active   fluticasone [facility]) 50 MCG/ACT nasal spray [id number]  PLACE 1 SPRAY INTO BOTH NOSTRILS AS NEEDED FOR ALERGIES Kozlow, Alvira Philips, MD  Active   ipratropium (ATROVENT) 0.06 % nasal spray [id number] No Place 2 sprays into both nostrils 4 (four) times daily. USE 2 SPRAYS INTO EACH NOSTRIL EVERY 6 HOURS AS NEEDED FOR TO DRY UP NOSE. Verlee Monte, MD Taking Active   ketoconazole (NIZORAL) 2 % shampoo [id number] No Apply 1 Application topically 2 (two) times a week. Nestor Ramp, MD Taking Active   levocetirizine [name]) 5 MG tablet [id number]  Take 1 tablet (5 mg total) by mouth daily as needed  for allergies (Can take an extra dose during flare ups.). Kozlow, Alvira Philips, MD  Active   linaclotide [name]) 72 MCG capsule [id number] No Take 2 capsules (144 mcg total) by mouth daily before breakfast. Moses Manners, MD Taking Active   mometasone-formoterol [facility]) 200-5 MCG/ACT Sandrea Matte [id number]  Inhale 2 puffs into the lungs in the morning and at bedtime. Take twice daily for 1-2 weeks with respiratory illness or asthma flare. Kozlow, Alvira Philips, MD  Active   montelukast (SINGULAIR) 10 MG tablet [id number]  Take 1 tablet (10 mg total) by mouth at bedtime. Take one tablet by mouth at bedtime Kozlow, Alvira Philips, MD  Active   Olopatadine HCl 0.7 % SOLN [id number] No Apply 1 drop to eye daily. [provider] Taking Active   omalizumab Geoffry Paradise) 150 MG/ML prefilled syringe [id number] No Inject 300 mg into the skin every 28 (twenty-eight) days. Kozlow, Alvira Philips, MD Taking Active   omalizumab Geoffry Paradise) injection 300 mg [id number]   Jessica Priest, MD  Active   Omega-3 Fatty Acids (FISH OIL) 1200 MG CAPS [id number] No Take 1 capsule by mouth. [provider] Taking  Active   Pyrithione Zinc (DHS ZINC) 2 % SHAM [id number] No Apply to scalp 3 to 5 times a week as ditreted Nestor Ramp, MD Taking Active   Respiratory Therapy Supplies (NEBULIZER MASK ADULT) MISC [id number] No 1 Device by Does not apply route as needed. Jessica Priest, MD Taking Active   Spacer/Aero-Holding Chambers DEVI [id number] No Use as directed with inhaler. Nehemiah Settle, FNP Taking Active   Specialty Vitamins Products (MG PLUS PROTEIN) 133 MG TABS [id number] No Take 1 tablet by mouth daily. [provider] Taking Active   tezepelumab-ekko (TEZSPIRE) 210 MG/1. syringe 210 mg [id number]   Jessica Priest, MD  Active   triamcinolone (KENALOG) 0.025 % ointment [id number] No Apply topically 2 (two) times daily. Nestor Ramp, MD Taking Active            Patient Active Problem List   Diagnosis Date Noted   Folliculitis  04/20/2022   Trigger finger of right thumb 09/06/2021   Primary osteoarthritis of left knee 08/15/2021   Eustachian tube dysfunction, bilateral 07/18/2021   Vaginal pruritus 07/18/2021   Photosensitivity dermatitis 01/06/2021   Recurrent ventral incisional hernia 11/25/2020   Ventral hernia 10/21/2020   Polypharmacy 10/21/2020   Dehydration 04/19/2020   Pap smear for cervical cancer screening 02/25/2020   Constipation 03/22/2019   Hx of adenomatous colonic polyps    Pilonidal cyst 02/22/2018   Diastolic CHF with preserved left ventricular function, NYHA class 2 (HCC) 12/14/2016   GERD (gastroesophageal reflux disease) 02/06/2015   Severe persistent asthma 02/06/2015   Intertrigo 10/22/2014   Depression, major, recurrent (HCC) 02/19/2014   Sleep apnea 03/10/2009   Seborrheic eczema 07/17/2007   LOW BACK PAIN SYNDROME 11/26/2006   HYPERCHOLESTEROLEMIA 08/02/2006   Morbid obesity (HCC) 08/02/2006   Anxiety state 08/02/2006   PSEUDOTUMOR CEREBRI 08/02/2006   OSTEOARTHRITIS, LOWER LEG 08/02/2006   FIBROMYALGIA, FIBROMYOSITIS 08/02/2006   Conditions to be addressed/monitored per PCP order:  Chronic healthcare management needs, CHF, GERD, asthma, rhinitis, anxiety/depression/PTSD/OCD, OSA, osteoarthritis, LBP, LPRD, SDOH  Care Plan : RN Care Manager Plan of Care  Updates made by Danie Chandler, RN since 04/02/2023 12:00 AM     Problem: Health Promotion or Disease Self-Management (General Plan of Care)      Long-Range Goal: Chronic Disease Management and Care Coordination Needs   Start Date: 02/27/2022  Expected End Date: 06/01/2023  Priority: High  Note:   Current Barriers:  Knowledge Deficits related to plan of care for management of CHF, GERD, asthma, osteoarthritis, constipation, sleep apnea Care Coordination needs related to financial constraints Chronic Disease Management support and education needs related to CHF, GERD, Asthma,osteoarthritis, constipation, sleep  apnea. 04/02/23:  Colonoscopy/Endoscopy completed 9/30-has f/u appt.  Son doing better.  Breathing okay. Main issue right now is housing-working with [employer].  F/U appt scheduled with BSW.  Eye appt scheduled.  RNCM Clinical Goal(s):  Patient will verbalize understanding of plan for management of CHF, GERD, asthma, osteoarthritis, constipation, sleep apnea as evidenced by patient report verbalize basic understanding of  CHF, GERD, asthma, osteoarthritis, constipation, sleep apnea disease process and self health management plan as evidenced by patient report take all medications exactly as prescribed and will call provider for medication related questions as evidenced by patient report demonstrate understanding of rationale for each prescribed medication as evidenced by patient report attend all scheduled medical appointments as evidenced by patient report continue to work with RN Care Manager to address care management and care coordination needs related  to  CHF, GERD, asthma, osteoarthritis, constipation, sleep apnea as evidenced by adherence to CM Team Scheduled appointments through collaboration with RN Care manager, provider, and care team.   Interventions: Inter-disciplinary care team collaboration (see longitudinal plan of care) Evaluation of current treatment plan related to  self management and patient's adherence to plan as established by provider Pharmacy referral for medication review Collaborated with Pharmacy BSW for housing questions  Collaborated with BSW 03/19/23:  RNCM returned patient's phone call. Patient upset, son tried to commit suicide last week-currently at [facility]. Emotional support and active listening provided. Patient has car now. RNCM and Managed Medicaid team will continue to follow.  04/02/23:  F/U BSW appt scheduled for this week as patient would like to talk to BSW.  Asthma: (Status:New goal.) Long Term Goal Provided patient with basic written and  verbal Asthma education on self care/management/and exacerbation prevention Advised patient to track and manage Asthma triggers Provided education about and advised patient to utilize infection prevention strategies to reduce risk of respiratory infection Discussed the importance of adequate rest and management of fatigue with Asthma Assessed social determinant of health barriers   CAD Interventions: (Status:  New goal.) Long Term Goal Assessed understanding of CAD diagnosis Medications reviewed including medications utilized in CAD treatment plan Provided education on importance of blood pressure control in management of CAD Provided education on Importance of limiting foods high in cholesterol Counseled on importance of regular laboratory monitoring as prescribed Reviewed Importance of taking all medications as prescribed Reviewed Importance of attending all scheduled provider appointments Advised to report any changes in symptoms or exercise tolerance Assessed social determinant of health barriers  Heart Failure Interventions:  (Status:  New goal.) Long Term Goal Basic overview and discussion of pathophysiology of Heart Failure reviewed Discussed the importance of keeping all appointments with provider Assessed social determinant of health barriers     (Status:  New goal.)  Long Term Goal Evaluation of current treatment plan related to  CHF, GERD, asthma, osteoarthritis, constipation, sleep apnea , Transportation and Housing barriers self-management and patient's adherence to plan as established by provider. Discussed plans with patient for ongoing care management follow up and provided patient with direct contact information for care management team Evaluation of current treatment plan related to chronic health conditions  and patient's adherence to plan as established by provider Advised patient to provide appropriate vaccination information to provider or CM team member at next  visit Advised patient to contact [employer] as a resource Reviewed medications with patient  Provided patient with information about Legal Aid and transportation Reviewed scheduled/upcoming provider appointments Discussed plans with patient for ongoing care management follow up and provided patient with direct contact information for care management team  SDOH Barriers (Status:  New goal.) Long Term Goal Patient interviewed and SDOH assessment performed        Patient interviewed and appropriate assessments performed Provided patient with information about [employer] Aid and [facility] Transportation services. Discussed plans with patient for ongoing care management follow up and provided patient with direct contact information for care management team  Patient Goals/Self-Care Activities: Take all medications as prescribed Attend all scheduled provider appointments Call pharmacy for medication refills 3-7 days in advance of running out of medications Perform all self care activities independently  Perform IADL's (shopping, preparing meals, housekeeping, managing finances) independently Call provider office for new concerns or questions  03/02/23: To reschedule eye appt-completed  Follow Up Plan:  The patient has been provided with contact  information for the care management team and has been advised to call with any health related questions or concerns.  The care management team will reach out to the patient again over the next 30  business  days.    Long-Range Goal: Establish Plan of Care for Chronic Disease Management Needs   Priority: High  Note:   Timeframe:  Long-Range Goal Priority:  High Start Date:   02/27/22                          Expected End Date:ongoing                       Follow Up Date 05/02/23   - practice safe sex - schedule appointment for flu shot - schedule appointment for vaccines needed due to my age or health - schedule recommended health tests (blood work,  mammogram, colonoscopy, pap test) - schedule and keep appointment for annual check-up    Why is this important?   Screening tests can find diseases early when they are easier to treat.  Your doctor or nurse will talk with you about which tests are important for you.  Getting shots for common diseases like the flu and shingles will help prevent them.   04/02/23:  Colonoscopy and Endoscopy completed.  Has eye appt in Nov or Dec per patient.  PCP 10/31.   Follow Up:  Patient agrees to Care Plan and Follow-up.  Plan: The Managed Medicaid care management team will reach out to the patient again over the next 30 business  days. and The  Patient has been provided with contact information for the Managed Medicaid care management team and has been advised to call with any health related questions or concerns.  Date/time of next scheduled RN care management/care coordination outreach: 05/02/23 at 1030

## 2023-04-03 ENCOUNTER — Ambulatory Visit: Payer: Medicaid Other | Admitting: Allergy and Immunology

## 2023-04-04 ENCOUNTER — Ambulatory Visit (INDEPENDENT_AMBULATORY_CARE_PROVIDER_SITE_OTHER): Payer: Medicaid Other | Admitting: *Deleted

## 2023-04-04 DIAGNOSIS — J4550 Severe persistent asthma, uncomplicated: Secondary | ICD-10-CM | POA: Diagnosis not present

## 2023-04-04 NOTE — Progress Notes (Unsigned)
   SUBJECTIVE:   CHIEF COMPLAINT / HPI:   CHF, OSA: - Medications: lipitor - Compliance: *** - Checking BP at home: *** - Denies any SOB, CP, vision changes, LE edema, medication SEs, or symptoms of hypotension - Diet: *** - Exercise: ***  Severe persistent asthma, eczema - Medications: albuterol PRN, dulera, singulair, tezspire (recently changed from [employer]) azithromycin - follows with Allergy & Asthma - Taking: *** - Common triggers: *** - ED visits/hospitalization in the last 6 months: *** - Current symptoms: *** - Asthma Action Plan: ***  GERD w/ h/o gastric ulcer - follows with GI, planning for EGD and colonoscopy  HM - due for flu and shingles vaccines.  OBJECTIVE:   LMP 02/03/2019 Comment: Tubal Ligation  ***  ASSESSMENT/PLAN:   No problem-specific Assessment & Plan notes found for this encounter.     [name], DO

## 2023-04-05 ENCOUNTER — Encounter: Payer: Self-pay | Admitting: Family Medicine

## 2023-04-05 ENCOUNTER — Ambulatory Visit: Payer: Medicaid Other | Admitting: Family Medicine

## 2023-04-05 VITALS — BP 124/62 | HR 87 | Ht 63.5 in | Wt 297.0 lb

## 2023-04-05 DIAGNOSIS — Z59819 Housing instability, housed unspecified: Secondary | ICD-10-CM

## 2023-04-05 DIAGNOSIS — K5900 Constipation, unspecified: Secondary | ICD-10-CM

## 2023-04-05 DIAGNOSIS — I5030 Unspecified diastolic (congestive) heart failure: Secondary | ICD-10-CM

## 2023-04-05 DIAGNOSIS — J4550 Severe persistent asthma, uncomplicated: Secondary | ICD-10-CM

## 2023-04-05 DIAGNOSIS — E669 Obesity, unspecified: Secondary | ICD-10-CM | POA: Diagnosis not present

## 2023-04-05 DIAGNOSIS — F411 Generalized anxiety disorder: Secondary | ICD-10-CM | POA: Diagnosis not present

## 2023-04-05 DIAGNOSIS — F3342 Major depressive disorder, recurrent, in full remission: Secondary | ICD-10-CM | POA: Diagnosis not present

## 2023-04-05 DIAGNOSIS — G4730 Sleep apnea, unspecified: Secondary | ICD-10-CM

## 2023-04-05 LAB — POCT GLYCOSYLATED HEMOGLOBIN (HGB A1C): Hemoglobin A1C: 5.6 % (ref 4.0–5.6)

## 2023-04-05 MED ORDER — OMEPRAZOLE 40 MG PO CPDR: 40.0000 mg | DELAYED_RELEASE_CAPSULE | Freq: Every day | ORAL | 0 refills | Status: AC

## 2023-04-05 MED ORDER — DHS ZINC 2 % EX SHAM: MEDICATED_SHAMPOO | CUTANEOUS | 3 refills | Status: DC

## 2023-04-05 NOTE — Assessment & Plan Note (Signed)
Euvolemic today. 

## 2023-04-05 NOTE — Assessment & Plan Note (Signed)
IBS-C. S/p recent colonoscopy and EGD with normal biopsies. Has GI f/u next month.

## 2023-04-05 NOTE — Assessment & Plan Note (Addendum)
Follows with Allergy and Asthma, receiving mab injections. Encouraged compliance with regimen. Lungs clear today with no difficulties breathing.

## 2023-04-05 NOTE — Assessment & Plan Note (Signed)
Intermittently compliant with CPAP, encouraged regular use. Per chart review, diagnosed when >400lbs and less snoring noted since weight loss. Consider repeat sleep study if needed.

## 2023-04-05 NOTE — Patient Instructions (Addendum)
It was great to see you!  Our plans for today:  - No changes to your medications today.  - Continue to keep your appointments with your GI and asthma doctors. - Come back in 6 months. Please bring ALL of your medications with you to every appointment.   We are checking some labs today, we will release these results to your MyChart.  Take care and seek immediate care sooner if you develop any concerns.   Dr. Linwood Dibbles

## 2023-04-05 NOTE — Assessment & Plan Note (Signed)
Worsened recently due to potential housing instability, will place social work referral. Declined PHQ or GAD today. Followed by psychiatry.

## 2023-04-06 ENCOUNTER — Telehealth: Payer: Self-pay

## 2023-04-06 ENCOUNTER — Other Ambulatory Visit: Payer: Self-pay

## 2023-04-06 NOTE — Patient Outreach (Signed)
Medicaid Managed Care Social Work Note  04/06/2023 Name:  Kelsey Bullock MRN:  [id number] DOB:  1961/01/03  Kelsey Bullock is an 62 y.o. year old female who is a primary patient of Kelsey Laroche, DO.  The [facility] Managed Care Coordination team was consulted for assistance with:   housing   Ms. Dost was given information about Medicaid Managed Care Coordination team services today. [name] Patient agreed to services and verbal consent obtained.  Engaged with patient  for by telephone forfollow up visit in response to referral for case management and/or care coordination services.   Assessments/Interventions:  Review of past medical history, allergies, medications, health status, including review of consultants reports, laboratory and other test data, was performed as part of comprehensive evaluation and provision of chronic care management services.  SDOH: (Social Determinant of Health) assessments and interventions performed: SDOH Interventions    Flowsheet Row Patient Outreach Telephone from 04/02/2023 in [place] POPULATION HEALTH DEPARTMENT Patient Outreach Telephone from 03/19/2023 in [place] POPULATION HEALTH DEPARTMENT Patient Outreach Telephone from 03/02/2023 in [place] POPULATION HEALTH DEPARTMENT Patient Outreach Telephone from 01/01/2023 in [place] POPULATION HEALTH DEPARTMENT Patient Outreach Telephone from 12/01/2022 in [place] POPULATION HEALTH DEPARTMENT Patient Outreach Telephone from 08/30/2022 in  POPULATION HEALTH DEPARTMENT  SDOH Interventions        Food Insecurity Interventions -- -- -- -- --  [BSW gave resources, sister also helps] --  Housing Interventions -- -- -- -- Other (Comment)  [patient given resources, discussed UHC MM? pest control services] --  Transportation Interventions -- -- -- -- -- Intervention Not Indicated  Utilities Interventions -- -- Intervention Not Indicated -- -- --  Alcohol Usage Interventions  -- -- -- Intervention Not Indicated (Score <7) -- --  Financial Strain Interventions -- Other (Comment)  [patient has been provided resources] -- -- -- --  Physical Activity Interventions Intervention Not Indicated -- -- -- -- --  Stress Interventions -- -- --  [receives therapy] -- -- --  Social Connections Interventions -- Intervention Not Indicated -- -- -- --  Health Literacy Interventions -- -- -- Intervention Not Indicated -- --     BSW completed a telephone outreach with patient, she states she is supposed to get evicted today by her office but it is not an official eviction. Patient states she spoke with someone from the office on yesterday and they told her she owes $1108, but is not sure if they will increase it. Patient states she is unsure of how much she owes. Patient states she did receive the resources BSW sent but a friend took the the resources. BSW informed patient to contact DSS since it is the first of the month, and will resent resources to via email at [email or web address]  Advanced Directives Status:  Not addressed in this encounter.  Care Plan                 Allergies  Allergen Reactions   Amoxicillin Anaphylaxis and Swelling   Etodolac Anaphylaxis and Shortness Of Breath    lips swelled   lips swelled  lips swelled,  lips swelled   Penicillins Anaphylaxis, Swelling and Rash    DID THE REACTION INVOLVE: Swelling of the face/tongue/throat, SOB, or low BP? Yes Sudden or severe rash/hives, skin peeling, or the inside of the mouth or nose? No Did it require medical treatment? No When did it last happen?      20 years ago If all above answers  are "NO", may proceed with cephalosporin use.    Latex Rash and Hives   Pantoprazole Sodium Hives    unspecified  REACTION: unspecified  unspecified, REACTION: unspecified   Wound Dressing Adhesive Rash   Ampicillin Swelling   Augmentin [Amoxicillin-Pot Clavulanate] Swelling   Diclofenac Sodium     Unknown  reaction    Pantoprazole Sodium     unspecified   Adhesive [Tape] Rash    Medications Reviewed Today   Medications were not reviewed in this encounter     Patient Active Problem List   Diagnosis Date Noted   Encounter for colonoscopy due to history of adenomatous colonic polyps 02/04/2023   Folliculitis 04/20/2022   Generalized anxiety disorder 12/13/2021   Trigger finger of right thumb 09/06/2021   Primary osteoarthritis of left knee 08/15/2021   ETD (Eustachian tube dysfunction), bilateral 07/18/2021   Vaginal pruritus 07/18/2021   Photosensitivity dermatitis 01/06/2021   Recurrent ventral incisional hernia 11/25/2020   Ventral hernia 10/21/2020   Polypharmacy 10/21/2020   Dehydration 04/19/2020   Pap smear for cervical cancer screening 02/25/2020   Constipation 03/22/2019   Pilonidal cyst 02/22/2018   Diastolic CHF with preserved left ventricular function, NYHA class 2 (HCC) 12/14/2016   Rhinitis, chronic 02/06/2015   Severe persistent asthma 02/06/2015   Intertrigo 10/22/2014   Depression, major, recurrent (HCC) 02/19/2014   Obsessive-compulsive disorder 11/09/2012   Posttraumatic stress disorder 11/09/2012   GERD (gastroesophageal reflux disease) 12/06/2011   Fibromyalgia 12/06/2011   Irritable bowel syndrome 12/06/2011   Rosacea 12/06/2011   Osteoporosis 12/06/2011   Hypertension 12/06/2011   History of kidney stones 12/06/2011   Depression, unspecified 12/06/2011   Urinary incontinence 09/27/2011   Sleep apnea 03/10/2009   Seborrheic eczema 07/17/2007   Asthma 11/26/2006   Low back pain syndrome 11/26/2006   Hypercholesterolemia 08/02/2006   Obesity 08/02/2006   Anxiety state 08/02/2006   PSEUDOTUMOR CEREBRI 08/02/2006   OSTEOARTHRITIS, LOWER LEG 08/02/2006   FIBROMYALGIA, FIBROMYOSITIS 08/02/2006    Conditions to be addressed/monitored per PCP order:   housing  There are no care plans that you recently modified to display for this patient.   Follow  up:  Patient agrees to Care Plan and Follow-up.  Plan: The Managed Medicaid care management team will reach out to the patient again over the next 7-15 days.  Date/time of next scheduled Social Work care management/care coordination outreach:  04/25/23  Gus Puma, Kenard Gower, [facility]  Managed [facility] Social Worker [phone]

## 2023-04-06 NOTE — Telephone Encounter (Signed)
Patient calls nurse line in regards to Omeprazole prescription.   She reports she went to [facility] to pick this up, however they stated they "didn't have anything."   I called [facility]. They report they did receive the prescription, however she picked up a 90 day supply on 10/11. Therefore, the prescription is too soon to fill.   Attempted to let patient know, however no answer.

## 2023-04-06 NOTE — Patient Instructions (Signed)
Visit Information  Ms. Starry was given information about Medicaid Managed Care team care coordination services as a part of their [facility] Community Plan Medicaid benefit. OMMIE DEGEORGE verbally consented to engagement with the [facility] Managed Care team.   If you are experiencing a medical emergency, please call 911 or report to your local emergency department or urgent care.   If you have a non-emergency medical problem during routine business hours, please contact your provider's office and ask to speak with a nurse.   For questions related to your [facility], please call: [phone] or visit the homepage here: [email or web address]  If you would like to schedule transportation through your [facility], please call the following number at least 2 days in advance of your appointment: [phone]   Rides for urgent appointments can also be made after hours by calling Member Services.  Call the Behavioral Health Crisis Line at [phone], at any time, 24 hours a day, 7 days a week. If you are in danger or need immediate medical attention call 911.  If you would like help to quit smoking, call 1-800-QUIT-NOW ((780) 747-7823) OR Espaol: 1-855-Djelo-Ya (1-324-401-0272) o para ms informacin haga clic aqu or Text READY to 536-644 to register via text  Ms. Hice - following are the goals we discussed in your visit today:   Goals Addressed   None     Social Worker will follow up on 04/25/23.   Gus Puma, Kenard Gower, MHA [facility]  Managed Medicaid Social Worker [phone]   Following is a copy of your plan of care:  There are no care plans that you recently modified to display for this patient.

## 2023-04-09 ENCOUNTER — Ambulatory Visit: Payer: Medicaid Other

## 2023-04-25 ENCOUNTER — Other Ambulatory Visit: Payer: Self-pay

## 2023-04-25 NOTE — Patient Outreach (Signed)
Medicaid Managed Care Social Work Note  04/25/2023 Name:  Kelsey Bullock MRN:  [id number] DOB:  1960/08/08  Kelsey Bullock is an 62 y.o. year old female who is a primary patient of [name], DO.  The [facility] Managed Care Coordination team was consulted for assistance with:   housing   Ms. Fearnow was given information about Medicaid Managed Care Coordination team services today. [name] Patient agreed to services and verbal consent obtained.  Engaged with patient  for by telephone forfollow up visit in response to referral for case management and/or care coordination services.   Assessments/Interventions:  Review of past medical history, allergies, medications, health status, including review of consultants reports, laboratory and other test data, was performed as part of comprehensive evaluation and provision of chronic care management services.  SDOH: (Social Determinant of Health) assessments and interventions performed: SDOH Interventions    Flowsheet Row Patient Outreach Telephone from 04/02/2023 in [place] POPULATION HEALTH DEPARTMENT Patient Outreach Telephone from 03/19/2023 in [place] POPULATION HEALTH DEPARTMENT Patient Outreach Telephone from 03/02/2023 in [place] POPULATION HEALTH DEPARTMENT Patient Outreach Telephone from 01/01/2023 in [place] POPULATION HEALTH DEPARTMENT Patient Outreach Telephone from 12/01/2022 in [place] POPULATION HEALTH DEPARTMENT Patient Outreach Telephone from 08/30/2022 in  POPULATION HEALTH DEPARTMENT  SDOH Interventions        Food Insecurity Interventions -- -- -- -- --  [BSW gave resources, sister also helps] --  Housing Interventions -- -- -- -- Other (Comment)  [patient given resources, discussed UHC MM? pest control services] --  Transportation Interventions -- -- -- -- -- Intervention Not Indicated  Utilities Interventions -- -- Intervention Not Indicated -- -- --  Alcohol Usage Interventions  -- -- -- Intervention Not Indicated (Score <7) -- --  Financial Strain Interventions -- Other (Comment)  [patient has been provided resources] -- -- -- --  Physical Activity Interventions Intervention Not Indicated -- -- -- -- --  Stress Interventions -- -- --  [receives therapy] -- -- --  Social Connections Interventions -- Intervention Not Indicated -- -- -- --  Health Literacy Interventions -- -- -- Intervention Not Indicated -- --     BSW completed a telephone outreach with patient, she states they used her sons school money to pay a little over 1100 for the rent. Patient states they no longer have a balance. She states it is still hard because she has storage of 135 month and rent is 190. Patient states she is trying to decide if she wants to stay in [place] or move to another state. Patient states no other resources are needed at this time.   Advanced Directives Status:  Not addressed in this encounter.  Care Plan                 Allergies  Allergen Reactions   Amoxicillin Anaphylaxis and Swelling   Etodolac Anaphylaxis and Shortness Of Breath    lips swelled   lips swelled  lips swelled,  lips swelled   Penicillins Anaphylaxis, Swelling and Rash    DID THE REACTION INVOLVE: Swelling of the face/tongue/throat, SOB, or low BP? Yes Sudden or severe rash/hives, skin peeling, or the inside of the mouth or nose? No Did it require medical treatment? No When did it last happen?      20 years ago If all above answers are "NO", may proceed with cephalosporin use.    Latex Rash and Hives   Pantoprazole Sodium Hives    unspecified  REACTION: unspecified  unspecified, REACTION: unspecified   Wound Dressing Adhesive Rash   Ampicillin Swelling   Augmentin [Amoxicillin-Pot Clavulanate] Swelling   Diclofenac Sodium     Unknown reaction    Pantoprazole Sodium     unspecified   Adhesive [Tape] Rash    Medications Reviewed Today   Medications were not reviewed in this  encounter     Patient Active Problem List   Diagnosis Date Noted   Encounter for colonoscopy due to history of adenomatous colonic polyps 02/04/2023   Folliculitis 04/20/2022   Generalized anxiety disorder 12/13/2021   Trigger finger of right thumb 09/06/2021   Primary osteoarthritis of left knee 08/15/2021   ETD (Eustachian tube dysfunction), bilateral 07/18/2021   Vaginal pruritus 07/18/2021   Photosensitivity dermatitis 01/06/2021   Recurrent ventral incisional hernia 11/25/2020   Ventral hernia 10/21/2020   Polypharmacy 10/21/2020   Dehydration 04/19/2020   Pap smear for cervical cancer screening 02/25/2020   Constipation 03/22/2019   Pilonidal cyst 02/22/2018   Diastolic CHF with preserved left ventricular function, NYHA class 2 (HCC) 12/14/2016   Rhinitis, chronic 02/06/2015   Severe persistent asthma 02/06/2015   Intertrigo 10/22/2014   Depression, major, recurrent (HCC) 02/19/2014   Obsessive-compulsive disorder 11/09/2012   Posttraumatic stress disorder 11/09/2012   GERD (gastroesophageal reflux disease) 12/06/2011   Fibromyalgia 12/06/2011   Irritable bowel syndrome 12/06/2011   Rosacea 12/06/2011   Osteoporosis 12/06/2011   Hypertension 12/06/2011   History of kidney stones 12/06/2011   Depression, unspecified 12/06/2011   Urinary incontinence 09/27/2011   Sleep apnea 03/10/2009   Seborrheic eczema 07/17/2007   Asthma 11/26/2006   Low back pain syndrome 11/26/2006   Hypercholesterolemia 08/02/2006   Obesity 08/02/2006   Anxiety state 08/02/2006   PSEUDOTUMOR CEREBRI 08/02/2006   OSTEOARTHRITIS, LOWER LEG 08/02/2006   FIBROMYALGIA, FIBROMYOSITIS 08/02/2006    Conditions to be addressed/monitored per PCP order:   housing  There are no care plans that you recently modified to display for this patient.   Follow up:  Patient agrees to Care Plan and Follow-up.  Plan: The  Patient has been provided with contact information for the Managed Medicaid care  management team and has been advised to call with any health related questions or concerns.    Abelino Derrick, MHA [facility]  Managed [facility] Social Worker [phone]

## 2023-04-25 NOTE — Patient Instructions (Signed)
Visit Information  Kelsey Bullock was given information about Medicaid Managed Care team care coordination services as a part of their [facility] Community Plan Medicaid benefit. [name] verbally consented to engagement with the [facility] Managed Care team.   If you are experiencing a medical emergency, please call 911 or report to your local emergency department or urgent care.   If you have a non-emergency medical problem during routine business hours, please contact your provider's office and ask to speak with a nurse.   For questions related to your [facility], please call: [phone] or visit the homepage here: [email or web address]  If you would like to schedule transportation through your [facility], please call the following number at least 2 days in advance of your appointment: [phone]   Rides for urgent appointments can also be made after hours by calling Member Services.  Call the Behavioral Health Crisis Line at [phone], at any time, 24 hours a day, 7 days a week. If you are in danger or need immediate medical attention call 911.  If you would like help to quit smoking, call 1-800-QUIT-NOW (2124206536) OR Espaol: 1-855-Djelo-Ya (8-841-660-6301) o para ms informacin haga clic aqu or Text READY to 601-093 to register via text  [name] - following are the goals we discussed in your visit today:   Goals Addressed   None      The  Patient                                              has been provided with contact information for the Managed Medicaid care management team and has been advised to call with any health related questions or concerns.   Gus Puma, Kenard Gower, MHA [facility]  Managed Medicaid Social Worker [phone]   Following is a copy of your plan of care:  There are no care plans that you recently modified to  display for this patient.

## 2023-05-01 DIAGNOSIS — R1084 Generalized abdominal pain: Secondary | ICD-10-CM | POA: Diagnosis not present

## 2023-05-01 DIAGNOSIS — R194 Change in bowel habit: Secondary | ICD-10-CM | POA: Diagnosis not present

## 2023-05-01 DIAGNOSIS — K219 Gastro-esophageal reflux disease without esophagitis: Secondary | ICD-10-CM | POA: Diagnosis not present

## 2023-05-02 ENCOUNTER — Ambulatory Visit (INDEPENDENT_AMBULATORY_CARE_PROVIDER_SITE_OTHER): Payer: Medicaid Other

## 2023-05-02 ENCOUNTER — Other Ambulatory Visit: Payer: Self-pay | Admitting: Obstetrics and Gynecology

## 2023-05-02 DIAGNOSIS — J4550 Severe persistent asthma, uncomplicated: Secondary | ICD-10-CM | POA: Diagnosis not present

## 2023-05-02 NOTE — Patient Outreach (Signed)
Medicaid Managed Care   Nurse Care Manager Note  05/02/2023 Name:  Kelsey Bullock MRN:  [id number] DOB:  August 13, 1960  Kelsey Bullock is an 62 y.o. year old female who is a primary patient of Kelsey Laroche, DO.  The [facility] Managed Care Coordination team was consulted for assistance with:    Chronic healthcare management needs, CHF, GERD, asthma, rhinitis, anxiety/depression/PTSD/OCD, OSA, osteoarhtritis, LBP, LPRD, SDOH  [name] was given information about Medicaid Managed Care Coordination team services today. Kelsey Bullock Patient agreed to services and verbal consent obtained.  Engaged with patient by telephone for follow up visit in response to provider referral for case management and/or care coordination services.   Assessments/Interventions:  Review of past medical history, allergies, medications, health status, including review of consultants reports, laboratory and other test data, was performed as part of comprehensive evaluation and provision of chronic care management services.  SDOH (Social Determinants of Health) assessments and interventions performed: SDOH Interventions    Flowsheet Row Patient Outreach Telephone from 05/02/2023 in [place] POPULATION HEALTH DEPARTMENT Patient Outreach Telephone from 04/02/2023 in [place] POPULATION HEALTH DEPARTMENT Patient Outreach Telephone from 03/19/2023 in [place] POPULATION HEALTH DEPARTMENT Patient Outreach Telephone from 03/02/2023 in [place] POPULATION HEALTH DEPARTMENT Patient Outreach Telephone from 01/01/2023 in [place] POPULATION HEALTH DEPARTMENT Patient Outreach Telephone from 12/01/2022 in  POPULATION HEALTH DEPARTMENT  SDOH Interventions        Food Insecurity Interventions Other (Comment)  [has resources and sister helps] -- -- -- -- --  [BSW gave resources, sister also helps]  Housing Interventions Other (Comment)  [patient given resources-caught up on rent at this time] -- -- --  -- Other (Comment)  [patient given resources, discussed UHC MM? pest control services]  Utilities Interventions -- -- -- Intervention Not Indicated -- --  Alcohol Usage Interventions -- -- -- -- Intervention Not Indicated (Score <7) --  Financial Strain Interventions -- -- Other (Comment)  [patient has been provided resources] -- -- --  Physical Activity Interventions -- Intervention Not Indicated -- -- -- --  Stress Interventions -- -- -- --  Kelsey Bullock therapy] -- --  Social Connections Interventions -- -- Intervention Not Indicated -- -- --  Health Literacy Interventions -- -- -- -- Intervention Not Indicated --     Care Plan Allergies  Allergen Reactions   Amoxicillin Anaphylaxis and Swelling   Etodolac Anaphylaxis and Shortness Of Breath    lips swelled   lips swelled  lips swelled,  lips swelled   Penicillins Anaphylaxis, Swelling and Rash    DID THE REACTION INVOLVE: Swelling of the face/tongue/throat, SOB, or low BP? Yes Sudden or severe rash/hives, skin peeling, or the inside of the mouth or nose? No Did it require medical treatment? No When did it last happen?      20 years ago If all above answers are "NO", may proceed with cephalosporin use.    Latex Rash and Hives   Pantoprazole Sodium Hives    unspecified  REACTION: unspecified  unspecified, REACTION: unspecified   Wound Dressing Adhesive Rash   Ampicillin Swelling   Augmentin [Amoxicillin-Pot Clavulanate] Swelling   Diclofenac Sodium     Unknown reaction    Pantoprazole Sodium     unspecified   Adhesive [Tape] Rash   Medications Reviewed Today     Reviewed by Danie Chandler, RN (Registered Nurse) on 05/02/23 at 1043  Med List Status: <None>   Medication Order Taking? Sig Documenting Provider Last  Dose Status Informant  albuterol (PROVENTIL) (2.5 MG/3ML) 0.083% nebulizer solution [id number]  Take 3 mLs (2.5 mg total) by nebulization every 4 (four) hours as needed. USE 1 VIAL VIA NEBULIZER EVERY 4 TO 6 HOURS  AS NEEDED FOR COUGH OR WHEEZING Kozlow, Alvira Philips, MD  Active   albuterol (VENTOLIN HFA) 108 (90 Base) MCG/ACT inhaler [id number]  Inhale 2 puffs into the lungs every 4 (four) hours as needed. Kozlow, Alvira Philips, MD  Active   atorvastatin (LIPITOR) 20 MG tablet [id number] No Take 1 tablet (20 mg total) by mouth daily. Nestor Ramp, MD Taking Active   azithromycin (ZITHROMAX) 500 MG tablet [id number]  Take 1 tablet (500 mg total) by mouth daily. Kozlow, Alvira Philips, MD  Active   cetirizine (ZYRTEC) 10 MG tablet [id number] No Take 1 tablet (10 mg total) by mouth 2 (two) times daily as needed for allergies (Can take an extra dose for breakthrough symptoms). Kozlow, Alvira Philips, MD Taking Active   cyclobenzaprine (FLEXERIL) 10 MG tablet [id number] No Take 1 tablet (10 mg total) by mouth 3 (three) times daily as needed for muscle spasms. Nestor Ramp, MD Taking Active   diclofenac Sodium (VOLTAREN) 1 % GEL [id number] No Apply 4 g topically 4 (four) times daily. Littie Deeds, MD Taking Active   EPINEPHrine (EPIPEN 2-PAK) 0.3 mg/0.3 mL IJ SOAJ injection [id number] No Inject 0.3 mg into the muscle as needed for anaphylaxis. Kozlow, Alvira Philips, MD Taking Active   famotidine (PEPCID) 20 MG tablet [id number]  Take 1 tablet (20 mg total) by mouth in the morning. Kozlow, Alvira Philips, MD  Active   fluticasone [facility]) 50 MCG/ACT nasal spray [id number]  PLACE 1 SPRAY INTO BOTH NOSTRILS AS NEEDED FOR ALERGIES Kozlow, Alvira Philips, MD  Active   ipratropium (ATROVENT) 0.06 % nasal spray [id number] No Place 2 sprays into both nostrils 4 (four) times daily. USE 2 SPRAYS INTO EACH NOSTRIL EVERY 6 HOURS AS NEEDED FOR TO DRY UP NOSE. Verlee Monte, MD Taking Active   ketoconazole (NIZORAL) 2 % shampoo [id number] No Apply 1 Application topically 2 (two) times a week. Nestor Ramp, MD Taking Active   levocetirizine Kelsey Bullock) 5 MG tablet [id number]  Take 1 tablet (5 mg total) by mouth daily as needed for allergies (Can take an extra dose during flare ups.). Kozlow, Alvira Philips,  MD  Active   linaclotide Kelsey Bullock) 72 MCG capsule [id number] No Take 2 capsules (144 mcg total) by mouth daily before breakfast. Moses Manners, MD Taking Active   mometasone-formoterol [facility]) 200-5 MCG/ACT Sandrea Matte [id number]  Inhale 2 puffs into the lungs in the morning and at bedtime. Take twice daily for 1-2 weeks with respiratory illness or asthma flare. Kozlow, Alvira Philips, MD  Active   montelukast (SINGULAIR) 10 MG tablet [id number]  Take 1 tablet (10 mg total) by mouth at bedtime. Take one tablet by mouth at bedtime Kozlow, Alvira Philips, MD  Active   NYSTATIN powder [id number]  Apply topically 3 (three) times daily. [provider]  Active   Olopatadine HCl 0.7 % SOLN [id number] No Apply 1 drop to eye daily. [provider] Taking Active   Omega-3 Fatty Acids (FISH OIL) 1200 MG CAPS [id number] No Take 1 capsule by mouth. [provider] Taking Active   omeprazole (PRILOSEC) 40 MG capsule [id number]  Take 1 capsule (40 mg total) by mouth daily. Kelsey Laroche, DO  Active   Pyrithione Zinc (DHS ZINC) 2 % SHAM [id number]  Apply to scalp  3 to 5 times a week as ditreted Kelsey Laroche, DO  Active   Respiratory Therapy Supplies (NEBULIZER MASK ADULT) MISC [id number] No 1 Device by Does not apply route as needed. Jessica Priest, MD Taking Active   Spacer/Aero-Holding Chambers DEVI [id number] No Use as directed with inhaler. Nehemiah Settle, FNP Taking Active   Specialty Vitamins Products (MG PLUS PROTEIN) 133 MG TABS [id number] No Take 1 tablet by mouth daily. [provider] Taking Active   tezepelumab-ekko (TEZSPIRE) 210 MG/1. syringe 210 mg [id number]   Jessica Priest, MD  Active   triamcinolone (KENALOG) 0.025 % ointment [id number] No Apply topically 2 (two) times daily. Nestor Ramp, MD Taking Active            Patient Active Problem List   Diagnosis Date Noted   Encounter for colonoscopy due to history of adenomatous colonic polyps 02/04/2023   Folliculitis  04/20/2022   Generalized anxiety disorder 12/13/2021   Trigger finger of right thumb 09/06/2021   Primary osteoarthritis of left knee 08/15/2021   ETD (Eustachian tube dysfunction), bilateral 07/18/2021   Vaginal pruritus 07/18/2021   Photosensitivity dermatitis 01/06/2021   Recurrent ventral incisional hernia 11/25/2020   Ventral hernia 10/21/2020   Polypharmacy 10/21/2020   Dehydration 04/19/2020   Pap smear for cervical cancer screening 02/25/2020   Constipation 03/22/2019   Pilonidal cyst 02/22/2018   Diastolic CHF with preserved left ventricular function, NYHA class 2 (HCC) 12/14/2016   Rhinitis, chronic 02/06/2015   Severe persistent asthma 02/06/2015   Intertrigo 10/22/2014   Depression, major, recurrent (HCC) 02/19/2014   Obsessive-compulsive disorder 11/09/2012   Posttraumatic stress disorder 11/09/2012   GERD (gastroesophageal reflux disease) 12/06/2011   Fibromyalgia 12/06/2011   Irritable bowel syndrome 12/06/2011   Rosacea 12/06/2011   Osteoporosis 12/06/2011   Hypertension 12/06/2011   History of kidney stones 12/06/2011   Depression, unspecified 12/06/2011   Urinary incontinence 09/27/2011   Sleep apnea 03/10/2009   Seborrheic eczema 07/17/2007   Asthma 11/26/2006   Low back pain syndrome 11/26/2006   Hypercholesterolemia 08/02/2006   Obesity 08/02/2006   Anxiety state 08/02/2006   PSEUDOTUMOR CEREBRI 08/02/2006   OSTEOARTHRITIS, LOWER LEG 08/02/2006   FIBROMYALGIA, FIBROMYOSITIS 08/02/2006   Conditions to be addressed/monitored per PCP order:  Chronic healthcare management needs, CHF, GERD, asthma, rhinitis, anxiety/depression/PTSD/OCD, OSA, osteoarhtritis, LBP, LPRD, SDOH  Care Plan : RN Care Manager Plan of Care  Updates made by Danie Chandler, RN since 05/02/2023 12:00 AM     Problem: Health Promotion or Disease Self-Management (General Plan of Care)      Long-Range Goal: Chronic Disease Management and Care Coordination Needs   Start Date:  02/27/2022  Expected End Date: 08/02/2023  Priority: High  Note:   Current Barriers:  Knowledge Deficits related to plan of care for management of CHF, GERD, asthma, osteoarthritis, constipation, sleep apnea Care Coordination needs related to financial constraints Chronic Disease Management support and education needs related to CHF, GERD, Asthma,osteoarthritis, constipation, sleep apnea. 05/02/23:  Eye appointment 12/2.  No complaints today-caught up on rent-thinking about moving. To f/u om dentist appt.  RNCM Clinical Goal(s):  Patient will verbalize understanding of plan for management of CHF, GERD, asthma, osteoarthritis, constipation, sleep apnea as evidenced by patient report verbalize basic understanding of  CHF, GERD, asthma, osteoarthritis, constipation, sleep apnea disease process and self health management plan as evidenced by patient report take all medications exactly as prescribed and will call provider for medication related questions as evidenced by patient  report demonstrate understanding of rationale for each prescribed medication as evidenced by patient report attend all scheduled medical appointments as evidenced by patient report continue to work with RN Care Manager to address care management and care coordination needs related to  CHF, GERD, asthma, osteoarthritis, constipation, sleep apnea as evidenced by adherence to CM Team Scheduled appointments through collaboration with RN Care manager, provider, and care team.   Interventions: Inter-disciplinary care team collaboration (see longitudinal plan of care) Evaluation of current treatment plan related to  self management and patient's adherence to plan as established by provider Pharmacy referral for medication review Collaborated with Pharmacy BSW for housing questions  Collaborated with BSW 03/19/23:  RNCM returned patient's phone call. Patient upset, son tried to commit suicide last week-currently at [facility]. Emotional support and active listening provided. Patient has car now. RNCM and Managed Medicaid team will continue to follow.  04/02/23:  F/U BSW appt scheduled for this week as patient would like to talk to BSW.  Asthma: (Status:New goal.) Long Term Goal Provided patient with basic written and verbal Asthma education on self care/management/and exacerbation prevention Advised patient to track and manage Asthma triggers Provided education about and advised patient to utilize infection prevention strategies to reduce risk of respiratory infection Discussed the importance of adequate rest and management of fatigue with Asthma Assessed social determinant of health barriers   CAD Interventions: (Status:  New goal.) Long Term Goal Assessed understanding of CAD diagnosis Medications reviewed including medications utilized in CAD treatment plan Provided education on importance of blood pressure control in management of CAD Provided education on Importance of limiting foods high in cholesterol Counseled on importance of regular laboratory monitoring as prescribed Reviewed Importance of taking all medications as prescribed Reviewed Importance of attending all scheduled provider appointments Advised to report any changes in symptoms or exercise tolerance Assessed social determinant of health barriers  Heart Failure Interventions:  (Status:  New goal.) Long Term Goal Basic overview and discussion of pathophysiology of Heart Failure reviewed Discussed the importance of keeping all appointments with provider Assessed social determinant of health barriers     (Status:  New goal.)  Long Term Goal Evaluation of current treatment plan related to  CHF, GERD, asthma, osteoarthritis, constipation, sleep apnea , Transportation and Housing barriers self-management and patient's adherence to plan as established by provider. Discussed plans with patient for ongoing care management follow up and provided  patient with direct contact information for care management team Evaluation of current treatment plan related to chronic health conditions  and patient's adherence to plan as established by provider Advised patient to provide appropriate vaccination information to provider or CM team member at next visit Advised patient to contact Legal Aid as a resource Reviewed medications with patient  Provided patient with information about Legal Aid and transportation Reviewed scheduled/upcoming provider appointments Discussed plans with patient for ongoing care management follow up and provided patient with direct contact information for care management team  SDOH Barriers (Status:  New goal.) Long Term Goal Patient interviewed and SDOH assessment performed        Patient interviewed and appropriate assessments performed Provided patient with information about Legal Aid and [facility] Transportation services. Discussed plans with patient for ongoing care management follow up and provided patient with direct contact information for care management team  Patient Goals/Self-Care Activities: Take all medications as prescribed Attend all scheduled provider appointments Call pharmacy for medication refills 3-7 days in advance of running out of medications Perform all  self care activities independently  Perform IADL's (shopping, preparing meals, housekeeping, managing finances) independently Call provider office for new concerns or questions  03/02/23: To reschedule eye appt-completed 05/02/23:  To f/u on dentist appt.  Follow Up Plan:  The patient has been provided with contact information for the care management team and has been advised to call with any health related questions or concerns.  The care management team will reach out to the patient again over the next 30  business  days.    Long-Range Goal: Establish Plan of Care for Chronic Disease Management Needs   Priority: High  Note:   Timeframe:   Long-Range Goal Priority:  High Start Date:   02/27/22                          Expected End Date:ongoing                       Follow Up Date 06/04/23   - practice safe sex - schedule appointment for flu shot - schedule appointment for vaccines needed due to my age or health - schedule recommended health tests (blood work, mammogram, colonoscopy, pap test) - schedule and keep appointment for annual check-up    Why is this important?   Screening tests can find diseases early when they are easier to treat.  Your doctor or nurse will talk with you about which tests are important for you.  Getting shots for common diseases like the flu and shingles will help prevent them.   05/02/23:  Eye appt 12/2, Allergy and Asthma 12/3   Follow Up:  Patient agrees to Care Plan and Follow-up.  Plan: The Managed Medicaid care management team will reach out to the patient again over the next 30 business  days. and The  Patient has been provided with contact information for the Managed Medicaid care management team and has been advised to call with any health related questions or concerns.  Date/time of next scheduled RN care management/care coordination outreach:  06/04/23 at 1030.

## 2023-05-02 NOTE — Patient Instructions (Signed)
Hi Ms. Callanan hope you have a nice day and Thanksgving!  Ms. Genao was given information about Medicaid Managed Care team care coordination services as a part of their [facility] Community Plan Medicaid benefit. SHAWNTAY KETCHAM verbally consented to engagement with the [facility] Managed Care team.   If you are experiencing a medical emergency, please call 911 or report to your local emergency department or urgent care.   If you have a non-emergency medical problem during routine business hours, please contact your provider's office and ask to speak with a nurse.   For questions related to your [facility], please call: [phone] or visit the homepage here: [email or web address]  If you would like to schedule transportation through your [facility], please call the following number at least 2 days in advance of your appointment: [phone]   Rides for urgent appointments can also be made after hours by calling Member Services.  Call the Behavioral Health Crisis Line at [phone], at any time, 24 hours a day, 7 days a week. If you are in danger or need immediate medical attention call 911.  If you would like help to quit smoking, call 1-800-QUIT-NOW ((415)855-4318) OR Espaol: 1-855-Djelo-Ya (7-322-025-4270) o para ms informacin haga clic aqu or Text READY to 623-762 to register via text  Ms. Hibdon - following are the goals we discussed in your visit today:   Goals Addressed    Timeframe:  Long-Range Goal Priority:  High Start Date:   02/27/22                          Expected End Date:ongoing                       Follow Up Date 06/04/23   - practice safe sex - schedule appointment for flu shot - schedule appointment for vaccines needed due to my age or health - schedule recommended health tests (blood work, mammogram, colonoscopy, pap test) - schedule  and keep appointment for annual check-up    Why is this important?   Screening tests can find diseases early when they are easier to treat.  Your doctor or nurse will talk with you about which tests are important for you.  Getting shots for common diseases like the flu and shingles will help prevent them.   05/02/23:  Eye appt 12/2, Allergy and Asthma 12/3  Patient verbalizes understanding of instructions and care plan provided today and agrees to view in MyChart. Active MyChart status and patient understanding of how to access instructions and care plan via MyChart confirmed with patient.     The Managed Medicaid care management team will reach out to the patient again over the next 30 business  days.  The  Patient  has been provided with contact information for the Managed Medicaid care management team and has been advised to call with any health related questions or concerns.   Kathi Der RN, BSN [place]  [facility] Care Management Coordinator - Managed Medicaid High Risk [phone]   Following is a copy of your plan of care:  Care Plan : RN Care Manager Plan of Care  Updates made by Danie Chandler, RN since 05/02/2023 12:00 AM     Problem: Health Promotion or Disease Self-Management (General Plan of Care)      Long-Range Goal: Chronic Disease Management and Care Coordination Needs   Start Date: 02/27/2022  Expected End Date: 08/02/2023  Priority: High  Note:   Current Barriers:  Knowledge Deficits related to plan of care for management of CHF, GERD, asthma, osteoarthritis, constipation, sleep apnea Care Coordination needs related to financial constraints Chronic Disease Management support and education needs related to CHF, GERD, Asthma,osteoarthritis, constipation, sleep apnea. 05/02/23:  Eye appointment 12/2.  No complaints today-caught up on rent-thinking about moving. To f/u om dentist appt.  RNCM Clinical Goal(s):  Patient will verbalize  understanding of plan for management of CHF, GERD, asthma, osteoarthritis, constipation, sleep apnea as evidenced by patient report verbalize basic understanding of  CHF, GERD, asthma, osteoarthritis, constipation, sleep apnea disease process and self health management plan as evidenced by patient report take all medications exactly as prescribed and will call provider for medication related questions as evidenced by patient report demonstrate understanding of rationale for each prescribed medication as evidenced by patient report attend all scheduled medical appointments as evidenced by patient report continue to work with RN Care Manager to address care management and care coordination needs related to  CHF, GERD, asthma, osteoarthritis, constipation, sleep apnea as evidenced by adherence to CM Team Scheduled appointments through collaboration with RN Care manager, provider, and care team.   Interventions: Inter-disciplinary care team collaboration (see longitudinal plan of care) Evaluation of current treatment plan related to  self management and patient's adherence to plan as established by provider Pharmacy referral for medication review Collaborated with Pharmacy BSW for housing questions  Collaborated with BSW 03/19/23:  RNCM returned patient's phone call. Patient upset, son tried to commit suicide last week-currently at [facility]. Emotional support and active listening provided. Patient has car now. RNCM and Managed Medicaid team will continue to follow.  04/02/23:  F/U BSW appt scheduled for this week as patient would like to talk to BSW.  Asthma: (Status:New goal.) Long Term Goal Provided patient with basic written and verbal Asthma education on self care/management/and exacerbation prevention Advised patient to track and manage Asthma triggers Provided education about and advised patient to utilize infection prevention strategies to reduce risk of respiratory  infection Discussed the importance of adequate rest and management of fatigue with Asthma Assessed social determinant of health barriers   CAD Interventions: (Status:  New goal.) Long Term Goal Assessed understanding of CAD diagnosis Medications reviewed including medications utilized in CAD treatment plan Provided education on importance of blood pressure control in management of CAD Provided education on Importance of limiting foods high in cholesterol Counseled on importance of regular laboratory monitoring as prescribed Reviewed Importance of taking all medications as prescribed Reviewed Importance of attending all scheduled provider appointments Advised to report any changes in symptoms or exercise tolerance Assessed social determinant of health barriers  Heart Failure Interventions:  (Status:  New goal.) Long Term Goal Basic overview and discussion of pathophysiology of Heart Failure reviewed Discussed the importance of keeping all appointments with provider Assessed social determinant of health barriers     (Status:  New goal.)  Long Term Goal Evaluation of current treatment plan related to  CHF, GERD, asthma, osteoarthritis, constipation, sleep apnea , Transportation and Housing barriers self-management and patient's adherence to plan as established by provider. Discussed plans with patient for ongoing care management follow up and provided patient with direct contact information for care management team Evaluation of current treatment plan related to chronic health conditions  and patient's adherence to plan as established by provider Advised patient to provide appropriate vaccination information to provider or CM team member at next  visit Advised patient to contact Legal Aid as a resource Reviewed medications with patient  Provided patient with information about Legal Aid and transportation Reviewed scheduled/upcoming provider appointments Discussed plans with patient for  ongoing care management follow up and provided patient with direct contact information for care management team  SDOH Barriers (Status:  New goal.) Long Term Goal Patient interviewed and SDOH assessment performed        Patient interviewed and appropriate assessments performed Provided patient with information about Legal Aid and [facility] Transportation services. Discussed plans with patient for ongoing care management follow up and provided patient with direct contact information for care management team  Patient Goals/Self-Care Activities: Take all medications as prescribed Attend all scheduled provider appointments Call pharmacy for medication refills 3-7 days in advance of running out of medications Perform all self care activities independently  Perform IADL's (shopping, preparing meals, housekeeping, managing finances) independently Call provider office for new concerns or questions  03/02/23: To reschedule eye appt-completed 05/02/23:  To f/u on dentist appt.  Follow Up Plan:  The patient has been provided with contact information for the care management team and has been advised to call with any health related questions or concerns.  The care management team will reach out to the patient again over the next 30  business  days.

## 2023-05-07 DIAGNOSIS — H47323 Drusen of optic disc, bilateral: Secondary | ICD-10-CM | POA: Diagnosis not present

## 2023-05-07 DIAGNOSIS — H04123 Dry eye syndrome of bilateral lacrimal glands: Secondary | ICD-10-CM | POA: Diagnosis not present

## 2023-05-07 DIAGNOSIS — H0102B Squamous blepharitis left eye, upper and lower eyelids: Secondary | ICD-10-CM | POA: Diagnosis not present

## 2023-05-07 DIAGNOSIS — H40033 Anatomical narrow angle, bilateral: Secondary | ICD-10-CM | POA: Diagnosis not present

## 2023-05-07 DIAGNOSIS — H0102A Squamous blepharitis right eye, upper and lower eyelids: Secondary | ICD-10-CM | POA: Diagnosis not present

## 2023-05-07 DIAGNOSIS — H401131 Primary open-angle glaucoma, bilateral, mild stage: Secondary | ICD-10-CM | POA: Diagnosis not present

## 2023-05-07 DIAGNOSIS — H1045 Other chronic allergic conjunctivitis: Secondary | ICD-10-CM | POA: Diagnosis not present

## 2023-05-08 ENCOUNTER — Ambulatory Visit (INDEPENDENT_AMBULATORY_CARE_PROVIDER_SITE_OTHER): Payer: Medicaid Other | Admitting: Allergy and Immunology

## 2023-05-08 VITALS — BP 122/72 | HR 72 | Temp 98.4°F | Resp 18 | Ht 63.0 in | Wt 308.9 lb

## 2023-05-08 DIAGNOSIS — J3089 Other allergic rhinitis: Secondary | ICD-10-CM

## 2023-05-08 DIAGNOSIS — J4550 Severe persistent asthma, uncomplicated: Secondary | ICD-10-CM | POA: Diagnosis not present

## 2023-05-08 DIAGNOSIS — K219 Gastro-esophageal reflux disease without esophagitis: Secondary | ICD-10-CM

## 2023-05-08 MED ORDER — EPINEPHRINE 0.3 MG/0.3ML IJ SOAJ: 0.3000 mg | INTRAMUSCULAR | 2 refills | Status: AC | PRN

## 2023-05-08 MED ORDER — MONTELUKAST SODIUM 10 MG PO TABS: 10.0000 mg | ORAL_TABLET | Freq: Every evening | ORAL | 1 refills | Status: DC

## 2023-05-08 MED ORDER — DULERA 200-5 MCG/ACT IN AERO: 2.0000 | INHALATION_SPRAY | Freq: Two times a day (BID) | RESPIRATORY_TRACT | 5 refills | Status: DC

## 2023-05-08 MED ORDER — LEVOCETIRIZINE DIHYDROCHLORIDE 5 MG PO TABS: 5.0000 mg | ORAL_TABLET | Freq: Every day | ORAL | 1 refills | Status: DC | PRN

## 2023-05-08 MED ORDER — ALBUTEROL SULFATE (2.5 MG/3ML) 0.083% IN NEBU: 2.5000 mg | INHALATION_SOLUTION | RESPIRATORY_TRACT | 1 refills | Status: DC | PRN

## 2023-05-08 MED ORDER — FLUTICASONE PROPIONATE 50 MCG/ACT NA SUSP: 1.0000 | Freq: Every day | NASAL | 1 refills | Status: DC

## 2023-05-08 MED ORDER — CETIRIZINE HCL 10 MG PO TABS: 10.0000 mg | ORAL_TABLET | Freq: Two times a day (BID) | ORAL | 5 refills | Status: DC | PRN

## 2023-05-08 MED ORDER — ALBUTEROL SULFATE HFA 108 (90 BASE) MCG/ACT IN AERS: 2.0000 | INHALATION_SPRAY | RESPIRATORY_TRACT | 1 refills | Status: DC | PRN

## 2023-05-08 MED ORDER — NEBULIZER MASK ADULT MISC: 1.0000 | 1 refills | Status: AC | PRN

## 2023-05-08 MED ORDER — IPRATROPIUM BROMIDE 0.06 % NA SOLN: 2.0000 | Freq: Four times a day (QID) | NASAL | 2 refills | Status: AC

## 2023-05-08 MED ORDER — SPACER/AERO-HOLDING CHAMBERS DEVI: 0 refills | Status: AC

## 2023-05-08 MED ORDER — FAMOTIDINE 20 MG PO TABS: 20.0000 mg | ORAL_TABLET | Freq: Every morning | ORAL | 1 refills | Status: AC

## 2023-05-08 NOTE — Progress Notes (Unsigned)
[facility]elsey Bull[facility] - [place] - Kelsey Bullock   Follow-up Note  Referring Provider: Nestor Ramp, MD Primary Provider: [name], DO Date of Office Visit: 05/08/2023  Subjective:   Kelsey Bullock (DOB: 10/18/1960) is a 62 y.o. female who [facility]/2023 in re-evaluation of the following:  HPI: Kelsey Bullock returns to this clinic in evaluation of asthma, allergic rhinitis, LPR, history of diastolic dysfunction.  I last saw her in this clinic 13 February 2023.   She believes that she is doing very well at this point in time regarding her asthma while using tezepelumab along with some occasional anti-inflammatory agents for her airway and she rarely needs to use the short acting bronchodilator.  She does not really exercise to any significant degree.    And she has done very well with her upper airway as well using a nasal steroid.  She has not required a systemic steroid or an antibiotic for any type of airway issue.  Her reflux is now under the control of the GI MD and she is using a combination of famotidine twice a day and omeprazole and overall her reflux is doing pretty well.  She did receive this years flu vaccine.  Allergies as of 05/08/2023       Reactions   Amoxicillin Anaphylaxis, Swelling   Etodolac Anaphylaxis, Shortness Of Breath   lips swelled  lips swelled lips swelled,  lips swelled   Penicillins Anaphylaxis, Swelling, Rash   DID THE REACTION INVOLVE: Swelling of the face/tongue/throat, SOB, or low BP? Yes Sudden or severe rash/hives, skin peeling, or the inside of the mouth or nose? No Did it require medical treatment? No When did it last happen?      20 years ago If all above answers are "NO", may proceed with cephalosporin use.   Latex Rash, Hives   Pantoprazole Sodium Hives   unspecified REACTION: unspecified unspecified, REACTION: unspecified   Wound Dressing Adhesive Rash   Ampicillin Swelling    Augmentin [amoxicillin-pot Clavulanate] Swelling   Diclofenac Sodium    Unknown reaction    Pantoprazole Sodium    unspecified   Adhesive [tape] Rash        Medication List    albuterol (2.5 MG/3ML) 0.083% nebulizer solution Commonly known as: PROVENTIL Take 3 mLs (2.5 mg total) by nebulization every 4 (four) hours as needed. USE 1 VIAL VIA NEBULIZER EVERY 4 TO 6 HOURS AS NEEDED FOR COUGH OR WHEEZING   albuterol 108 (90 Base) MCG/ACT inhaler Commonly known as: VENTOLIN HFA Inhale 2 puffs into the lungs every 4 (four) hours as needed.   atorvastatin 20 MG tablet Commonly known as: LIPITOR Take 1 tablet (20 mg total) by mouth daily.   cetirizine 10 MG tablet Commonly known as: ZYRTEC Take 1 tablet (10 mg total) by mouth 2 (two) times daily as needed for allergies (Can take an extra dose for breakthrough symptoms).   cyclobenzaprine 10 MG tablet Commonly known as: FLEXERIL Take 1 tablet (10 mg total) by mouth 3 (three) times daily as needed for muscle spasms.   DHS Zinc 2 % Sham Generic drug: Pyrithione Zinc Apply to scalp 3 to 5 times a week as ditreted   diclofenac Sodium 1 % Gel Commonly known as: VOLTAREN Apply 4 g topically 4 (four) times daily.   Dulera 200-5 MCG/ACT Aero Generic drug: mometasone-formoterol Inhale 2 puffs into the lungs in the morning and at bedtime. Take twice daily  for 1-2 weeks with respiratory illness or asthma flare.   EPINEPHrine 0.3 mg/0.3 mL Soaj injection Commonly known as: EpiPen 2-Pak Inject 0.3 mg into the muscle as needed for anaphylaxis.   famotidine 20 MG tablet Commonly known as: PEPCID Take 1 tablet (20 mg total) by mouth in the morning.   Fish Oil 1200 MG Caps Take 1 capsule by mouth.   fluticasone 50 MCG/ACT nasal spray Commonly known as: FLONASE PLACE 1 SPRAY INTO BOTH NOSTRILS AS NEEDED FOR ALERGIES   ipratropium 0.06 % nasal spray Commonly known as: ATROVENT Place 2 sprays into both nostrils 4 (four) times  daily. USE 2 SPRAYS INTO EACH NOSTRIL EVERY 6 HOURS AS NEEDED FOR TO DRY UP NOSE.   ketoconazole 2 % shampoo Commonly known as: NIZORAL Apply 1 Application topically 2 (two) times a week.   levocetirizine 5 MG tablet Commonly known as: XYZAL Take 1 tablet (5 mg total) by mouth daily as needed for allergies (Can take an extra dose during flare ups.).   linaclotide 72 MCG capsule Commonly known as: LINZESS Take 2 capsules (144 mcg total) by mouth daily before breakfast.   MG Plus Protein 133 MG Tabs Take 1 tablet by mouth daily.   montelukast 10 MG tablet Commonly known as: SINGULAIR Take 1 tablet (10 mg total) by mouth at bedtime. Take one tablet by mouth at bedtime   Nebulizer Mask Adult Misc 1 Device by Does not apply route as needed.   nystatin powder Generic drug: nystatin Apply topically 3 (three) times daily.   Olopatadine HCl 0.7 % Soln Apply 1 drop to eye daily.   omeprazole 40 MG capsule Commonly known as: PRILOSEC Take 1 capsule (40 mg total) by mouth daily.   Spacer/Aero-Holding [employer] Use as directed with inhaler.   triamcinolone 0.025 % ointment Commonly known as: KENALOG Apply topically 2 (two) times daily.    Past Medical History:  Diagnosis Date   Allergic rhinitis    Anemia    Anxiety    Arthritis    knees and back   Asthma    Complication of anesthesia    Congestive heart failure (CHF) (HCC)    Depression    Gall stone    GERD (gastroesophageal reflux disease)    Headache    migraines, stress headaches   History of kidney stones    Hypertension    Hyperthyroidism    during pregnancy   Pneumonia    PONV (postoperative nausea and vomiting)    Recurrent upper respiratory infection (URI)    Sleep apnea    can't use Cpap    Past Surgical History:  Procedure Laterality Date   ADENOIDECTOMY     APPLICATION OF WOUND VAC  03/18/2019   APPLICATION OF WOUND VAC N/A 03/18/2019   Procedure: Application Of Wound Vac;  Surgeon: Manus Rudd, MD;  Location: [facility] OR;  Service: General;  Laterality: N/A;   BIOPSY  05/27/2018   Procedure: BIOPSY;  Surgeon: Napoleon Form, MD;  Location: [facility] ENDOSCOPY;  Service: Endoscopy;;   CESAREAN SECTION     COLONOSCOPY WITH PROPOFOL N/A 05/27/2018   Procedure: COLONOSCOPY WITH PROPOFOL;  Surgeon: Napoleon Form, MD;  Location: [facility]OSCOPY;  Service: Endoscopy;  Laterality: N/A;   CYST EXCISION     EXPLORATORY LAPAROTOMY  02/03/2019   INGUINAL HERNIA REPAIR N/A 02/03/2019   Procedure: EXPLORATORY LAPAROTOMY WITH SMALL BOWEL RESECTION AND PRIMARY CLOSURE OF VENTRAL HERNIA;  Surgeon: Manus Rudd, MD;  Location: [facility] OR;  Service: General;  Laterality: N/A;   INSERTION OF MESH N/A 11/25/2020   Procedure: INSERTION OF MESH;  Surgeon: Manus Rudd, MD;  Location: [facility] OR;  Service: General;  Laterality: N/A;   OVARIAN CYST REMOVAL     POLYPECTOMY  05/27/2018   Procedure: POLYPECTOMY;  Surgeon: Napoleon Form, MD;  Location: [facility]OS[facility];  Service: Endoscopy;;   removal of gallstones     TONSILLECTOMY     TYMPANOSTOMY TUBE PLACEMENT     VENTRAL HERNIA REPAIR N/A 11/25/2020   Procedure: RECURRENT VENTRAL HERNIA REPAIR WITH MESH;  Surgeon: Manus Rudd, MD;  Location[facility] OR;  Service: General;  Laterality: N/A;   WOUND EXPLORATION  03/18/2019   abdominal   WOUND EXPLORATION N/A 03/18/2019   Procedure: ABDOMINAL WOUND EXPLORATION;  Surgeon: Manus Rudd, MD;  Location: [facility] OR;  Service: General;  Laterality: N/A;    Review of systems negative except as noted in HPI / PMHx or noted below:  Review of Systems  Constitutional: Negative.   HENT: Negative.    Eyes: Negative.   Respiratory: Negative.    Cardiovascular: Negative.   Gastrointestinal: Negative.   Genitourinary: Negative.   Musculoskeletal: Negative.   Skin: Negative.   Neurological: Negative.   Endo/Heme/Allergies: Negative.   Psychiatric/Behavioral: Negative.       Objective:   Vitals:   05/08/23 1456   BP: 122/72  Pulse: 72  Resp: 18  Temp: 98.4 F (36.9 C)  SpO2: 97%   Height: 5\' 3"  (160 cm)  Weight: (!) 308 lb 14.4 oz (140.1 kg)   Physical Exam Constitutional:      Appearance: She is not diaphoretic.  HENT:     Head: Normocephalic.     Right Ear: Tympanic membrane, ear canal and external ear normal.     Left Ear: Tympanic membrane, ear canal and external ear normal.     Nose: Nose normal. No mucosal edema or rhinorrhea.     Mouth/Throat:     Pharynx: Uvula midline. No oropharyngeal exudate.  Eyes:     Conjunctiva/sclera: Conjunctivae normal.  Neck:     Thyroid: No thyromegaly.     Trachea: Trachea normal. No tracheal tenderness or tracheal deviation.  Cardiovascular:     Rate and Rhythm: Normal rate and regular rhythm.     Heart sounds: Normal heart sounds, S1 normal and S2 normal. No murmur heard. Pulmonary:     Effort: No respiratory distress.     Breath sounds: Normal breath sounds. No stridor. No wheezing or rales.  Lymphadenopathy:     Head:     Right side of head: No tonsillar adenopathy.     Left side of head: No tonsillar adenopathy.     Cervical: No cervical adenopathy.  Skin:    Findings: No erythema or rash.     Nails: There is no clubbing.  Neurological:     Mental Status: She is alert.     Diagnostics: Spirometry was performed and demonstrated an FEV1 of 1.82 at 76 % of predicted.   Assessment and Plan:   1. Asthma, severe persistent, well-controlled   2. Other allergic rhinitis   3. LPRD (laryngopharyngeal reflux disease)    1. Continue Tezepelumab injections every 4 weeks  2. Consistently use the following:   A. Dulera 200 - 2 inhalations 1-2 times per day  B. Flonase - 1 spray each nostril 1-2 times per day  C. Montelukast 10 mg daily  2.  If needed:   A.  Pro-air HFA  B.  Antihistamine   3. Continue treatment for reflux  4. Return to clinic in 4 months or earlier if problem.    Genesy appears to be doing very well while she  addresses the inflammatory condition of her airway with tezepelumab and a collection of anti-inflammatory agents and her reflux appears to be doing very well on a plan assigned by her GI doctor.  Assuming she continues to do well I will see her back in this clinic in 4 months or earlier if there is a problem.   Laurette Schimke, MD Allergy / Immunology [facility]

## 2023-05-08 NOTE — Patient Instructions (Addendum)
    1. Continue Tezepelumab injections every 4 weeks  2. Consistently use the following:   A. Dulera 200 - 2 inhalations 1-2 times per day  B. Flonase - 1 spray each nostril 1-2 times per day  C. Montelukast 10 mg daily  2.  If needed:   A.  Pro-air HFA  B.  Antihistamine   3. Continue treatment for reflux  4. Return to clinic in 4 months or earlier if problem.

## 2023-05-09 ENCOUNTER — Encounter: Payer: Self-pay | Admitting: Allergy and Immunology

## 2023-05-28 ENCOUNTER — Ambulatory Visit (INDEPENDENT_AMBULATORY_CARE_PROVIDER_SITE_OTHER): Payer: Medicaid Other

## 2023-05-28 DIAGNOSIS — J4550 Severe persistent asthma, uncomplicated: Secondary | ICD-10-CM

## 2023-06-04 ENCOUNTER — Other Ambulatory Visit: Payer: Self-pay | Admitting: Obstetrics and Gynecology

## 2023-06-04 NOTE — Patient Outreach (Signed)
Medicaid Managed Care   Nurse Care Manager Note  06/04/2023 Name:  Kelsey Bullock MRN:  [id number] DOB:  [birth year]  Kelsey Bullock is an 62 y.o. year old female who is a primary patient of [name], DO.  [facility] Managed Care Coordination team was consulted for assistance with:    Chronic healthcare management needs, CHF, GERD, asthma, rhinitis, OSA, osteoarthritis, LBP, LPRD, SDOH, anxiety/depression/PTSD/OCD  Kelsey Bullock was given information about Medicaid Managed Care Coordination team services today. Kelsey Bullock Patient agreed to services and verbal consent obtained.  Engaged with patient by telephone for follow up visit in response to provider referral for case management and/or care coordination services.   Patient is participating in a Managed Medicaid Plan:  Yes  Assessments/Interventions:  Review of past medical history, allergies, medications, health status, including review of consultants reports, laboratory and other test data, was performed as part of comprehensive evaluation and provision of chronic care management services.  SDOH (Social Drivers of Health) assessments and interventions performed: SDOH Interventions    Flowsheet Row Patient Outreach Telephone from 06/04/2023 in [place] [facility] POPULATION HEALTH DEPARTMENT Patient Outreach Telephone from 05/02/2023 in [place] POPULATION HEALTH DEPARTMENT Patient Outreach Telephone from 04/02/2023 in [place] POPULATION HEALTH DEPARTMENT Patient Outreach Telephone from 03/19/2023 in [place] POPULATION HEALTH DEPARTMENT Patient Outreach Telephone from 03/02/2023 in [place] POPULATION HEALTH DEPARTMENT Patient Outreach Telephone from 01/01/2023 in [place] POPULATION HEALTH DEPARTMENT  SDOH Interventions        Food Insecurity Interventions -- Other (Comment)  [has resources and sister helps] -- -- -- --  Housing Interventions -- Other (Comment)  [patient given resources-caught up on rent at  this time] -- -- -- --  Utilities Interventions -- -- -- -- Intervention Not Indicated --  Alcohol Usage Interventions Intervention Not Indicated (Score <7) -- -- -- -- Intervention Not Indicated (Score <7)  Financial Strain Interventions -- -- -- Other (Comment)  [patient has been provided resources] -- --  Physical Activity Interventions -- -- Intervention Not Indicated -- -- --  Stress Interventions -- -- -- -- --  Kelsey Bullock therapy] --  Social Connections Interventions -- -- -- Intervention Not Indicated -- --  Health Literacy Interventions Intervention Not Indicated -- -- -- -- Intervention Not Indicated     Care Plan Allergies  Allergen Reactions   Amoxicillin Anaphylaxis and Swelling   Etodolac Anaphylaxis and Shortness Of Breath    lips swelled   lips swelled  lips swelled,  lips swelled   Penicillins Anaphylaxis, Swelling and Rash    DID THE REACTION INVOLVE: Swelling of the face/tongue/throat, SOB, or low BP? Yes Sudden or severe rash/hives, skin peeling, or the inside of the mouth or nose? No Did it require medical treatment? No When did it last happen?      20 years ago If all above answers are "NO", may proceed with cephalosporin use.    Latex Rash and Hives   Pantoprazole Sodium Hives    unspecified  REACTION: unspecified  unspecified, REACTION: unspecified   Wound Dressing Adhesive Rash   Ampicillin Swelling   Augmentin [Amoxicillin-Pot Clavulanate] Swelling   Diclofenac Sodium     Unknown reaction    Pantoprazole Sodium     unspecified   Adhesive [Tape] Rash   Medications Reviewed Today     Reviewed by Danie Chandler, RN (Registered Nurse) on 06/04/23 at 1025  Med List Status: <None>   Medication Order Taking? Sig Documenting Provider Last Dose  Status Informant  albuterol (PROVENTIL) (2.5 MG/3ML) 0.083% nebulizer solution [id number]  Take 3 mLs (2.5 mg total) by nebulization every 4 (four) hours as needed. USE 1 VIAL VIA NEBULIZER EVERY 4 TO 6 HOURS AS  NEEDED FOR COUGH OR WHEEZING Kozlow, Alvira Philips, MD  Active   albuterol (VENTOLIN HFA) 108 (90 Base) MCG/ACT inhaler [id number]  Inhale 2 puffs into the lungs every 4 (four) hours as needed. Kozlow, Alvira Philips, MD  Active   atorvastatin (LIPITOR) 20 MG tablet [id number] No Take 1 tablet (20 mg total) by mouth daily. Nestor Ramp, MD Taking Active   cetirizine (ZYRTEC) 10 MG tablet [id number]  Take 1 tablet (10 mg total) by mouth 2 (two) times daily as needed for allergies (Can take an extra dose for breakthrough symptoms). Kozlow, Alvira Philips, MD  Active   cyclobenzaprine (FLEXERIL) 10 MG tablet [id number] No Take 1 tablet (10 mg total) by mouth 3 (three) times daily as needed for muscle spasms. Nestor Ramp, MD Taking Active   diclofenac Sodium (VOLTAREN) 1 % GEL [id number] No Apply 4 g topically 4 (four) times daily. Littie Deeds, MD Taking Active   EPINEPHrine (EPIPEN 2-PAK) 0.3 mg/0.3 mL IJ SOAJ injection [id number]  Inject 0.3 mg into the muscle as needed for anaphylaxis. Kozlow, Alvira Philips, MD  Active   famotidine (PEPCID) 20 MG tablet [id number]  Take 1 tablet (20 mg total) by mouth in the morning. Kozlow, Alvira Philips, MD  Active   fluticasone [facility]) 50 MCG/ACT nasal spray [id number]  Place 1 spray into both nostrils daily. Kozlow, Alvira Philips, MD  Active   ipratropium (ATROVENT) 0.06 % nasal spray [id number]  Place 2 sprays into both nostrils 4 (four) times daily. USE 2 SPRAYS INTO EACH NOSTRIL EVERY 6 HOURS AS NEEDED FOR TO DRY UP NOSE. Kozlow, Alvira Philips, MD  Active   ketoconazole (NIZORAL) 2 % shampoo [id number] No Apply 1 Application topically 2 (two) times a week. Nestor Ramp, MD Taking Active   levocetirizine (XYZAL) 5 MG tablet [id number]  Take 1 tablet (5 mg total) by mouth daily as needed for allergies (Can take an extra dose during flare ups.). Kozlow, Alvira Philips, MD  Active   linaclotide [name]) 72 MCG capsule [id number] No Take 2 capsules (144 mcg total) by mouth daily before breakfast. Moses Manners, MD Taking Active    mometasone-formoterol [facility]) 200-5 MCG/ACT Sandrea Matte [id number]  Inhale 2 puffs into the lungs in the morning and at bedtime. Take twice daily for 1-2 weeks with respiratory illness or asthma flare. Kozlow, Alvira Philips, MD  Active   montelukast (SINGULAIR) 10 MG tablet [id number]  Take 1 tablet (10 mg total) by mouth at bedtime. Take one tablet by mouth at bedtime Kozlow, Alvira Philips, MD  Active   NYSTATIN powder [id number] No Apply topically 3 (three) times daily. [provider] Taking Active   Olopatadine HCl 0.7 % SOLN [id number] No Apply 1 drop to eye daily. [provider] Taking Active   Omega-3 Fatty Acids (FISH OIL) 1200 MG CAPS [id number] No Take 1 capsule by mouth. [provider] Taking Active   omeprazole (PRILOSEC) 40 MG capsule [id number] No Take 1 capsule (40 mg total) by mouth daily. [name], DO Taking Active   Pyrithione Zinc (DHS ZINC) 2 % SHAM [id number] No Apply to scalp 3 to 5 times a week as ditreted [name], DO Taking Active   Respiratory Therapy Supplies (NEBULIZER MASK ADULT) MISC [id number]  1 Device by  Does not apply route as needed. Jessica Priest, MD  Active   Spacer/Aero-Holding Chambers DEVI [id number]  Use as directed with inhaler. Kozlow, Alvira Philips, MD  Active   Specialty Vitamins Products (MG PLUS PROTEIN) 133 MG TABS [id number] No Take 1 tablet by mouth daily. [provider] Taking Active   tezepelumab-ekko (TEZSPIRE) 210 MG/1. syringe 210 mg [id number]   Jessica Priest, MD  Active   triamcinolone (KENALOG) 0.025 % ointment [id number] No Apply topically 2 (two) times daily. Nestor Ramp, MD Taking Active            Patient Active Problem List   Diagnosis Date Noted   Encounter for colonoscopy due to history of adenomatous colonic polyps 02/04/2023   Folliculitis 04/20/2022   Generalized anxiety disorder 12/13/2021   Trigger finger of right thumb 09/06/2021   Primary osteoarthritis of left knee 08/15/2021   ETD (Eustachian  tube dysfunction), bilateral 07/18/2021   Vaginal pruritus 07/18/2021   Photosensitivity dermatitis 01/06/2021   Recurrent ventral incisional hernia 11/25/2020   Ventral hernia 10/21/2020   Polypharmacy 10/21/2020   Dehydration 04/19/2020   Pap smear for cervical cancer screening 02/25/2020   Constipation 03/22/2019   Pilonidal cyst 02/22/2018   Diastolic CHF with preserved left ventricular function, NYHA class 2 (HCC) 12/14/2016   Rhinitis, chronic 02/06/2015   Severe persistent asthma 02/06/2015   Intertrigo 10/22/2014   Depression, major, recurrent (HCC) 02/19/2014   Obsessive-compulsive disorder 11/09/2012   Posttraumatic stress disorder 11/09/2012   GERD (gastroesophageal reflux disease) 12/06/2011   Fibromyalgia 12/06/2011   Irritable bowel syndrome 12/06/2011   Rosacea 12/06/2011   Osteoporosis 12/06/2011   Hypertension 12/06/2011   History of kidney stones 12/06/2011   Depression, unspecified 12/06/2011   Urinary incontinence 09/27/2011   Sleep apnea 03/10/2009   Seborrheic eczema 07/17/2007   Asthma 11/26/2006   Low back pain syndrome 11/26/2006   Hypercholesterolemia 08/02/2006   Obesity 08/02/2006   Anxiety state 08/02/2006   PSEUDOTUMOR CEREBRI 08/02/2006   OSTEOARTHRITIS, LOWER LEG 08/02/2006   FIBROMYALGIA, FIBROMYOSITIS 08/02/2006   Conditions to be addressed/monitored per PCP order:  Chronic healthcare management needs, CHF, GERD, asthma, rhinitis, OSA, osteoarthritis, LBP, LPRD, SDOH, anxiety/depression/PTSD/OCD  Care Plan : RN Care Manager Plan of Care  Updates made by Danie Chandler, RN since 06/04/2023 12:00 AM     Problem: Health Promotion or Disease Self-Management (General Plan of Care)      Long-Range Goal: Chronic Disease Management and Care Coordination Needs   Start Date: 02/27/2022  Expected End Date: 08/02/2023  Priority: High  Note:   Current Barriers:  Knowledge Deficits related to plan of care for management of CHF, GERD, asthma,  osteoarthritis, constipation, sleep apnea Care Coordination needs related to financial constraints Chronic Disease Management support and education needs related to CHF, GERD, Asthma,osteoarthritis, constipation, sleep apnea. 06/04/23:  has not scheduled dental appt yet.  Eye appt okay-possible cataract surgery in the near future.  Breathing okay.  Concerned about cost of food and food resources-BSW appt scheduled.    RNCM Clinical Goal(s):  Patient will verbalize understanding of plan for management of CHF, GERD, asthma, osteoarthritis, constipation, sleep apnea as evidenced by patient report verbalize basic understanding of  CHF, GERD, asthma, osteoarthritis, constipation, sleep apnea disease process and self health management plan as evidenced by patient report take all medications exactly as prescribed and will call provider for medication related questions as evidenced by patient report demonstrate understanding of rationale for each prescribed medication as evidenced by  patient report attend all scheduled medical appointments as evidenced by patient report continue to work with RN Care Manager to address care management and care coordination needs related to  CHF, GERD, asthma, osteoarthritis, constipation, sleep apnea as evidenced by adherence to CM Team Scheduled appointments through collaboration with RN Care manager, provider, and care team.   Interventions: Inter-disciplinary care team collaboration (see longitudinal plan of care) Evaluation of current treatment plan related to  self management and patient's adherence to plan as established by provider Pharmacy referral for medication review Collaborated with Pharmacy BSW for housing questions  Collaborated with BSW 03/19/23:  RNCM returned patient's phone call. Patient upset, son tried to commit suicide last week-currently at [facility]. Emotional support and active listening provided. Patient has car now. RNCM and Managed  Medicaid team will continue to follow.  04/02/23:  F/U BSW appt scheduled for this week as patient would like to talk to BSW-completed 06/04/23:  BSW appt scheduled for patient-concerned about food resources.    Asthma: (Status:New goal.) Long Term Goal Provided patient with basic written and verbal Asthma education on self care/management/and exacerbation prevention Advised patient to track and manage Asthma triggers Provided education about and advised patient to utilize infection prevention strategies to reduce risk of respiratory infection Discussed the importance of adequate rest and management of fatigue with Asthma Assessed social determinant of health barriers   CAD Interventions: (Status:  New goal.) Long Term Goal Assessed understanding of CAD diagnosis Medications reviewed including medications utilized in CAD treatment plan Provided education on importance of blood pressure control in management of CAD Provided education on Importance of limiting foods high in cholesterol Counseled on importance of regular laboratory monitoring as prescribed Reviewed Importance of taking all medications as prescribed Reviewed Importance of attending all scheduled provider appointments Advised to report any changes in symptoms or exercise tolerance Assessed social determinant of health barriers  Heart Failure Interventions:  (Status:  New goal.) Long Term Goal Basic overview and discussion of pathophysiology of Heart Failure reviewed Discussed the importance of keeping all appointments with provider Assessed social determinant of health barriers     (Status:  New goal.)  Long Term Goal Evaluation of current treatment plan related to  CHF, GERD, asthma, osteoarthritis, constipation, sleep apnea , Transportation and Housing barriers self-management and patient's adherence to plan as established by provider. Discussed plans with patient for ongoing care management follow up and provided patient  with direct contact information for care management team Evaluation of current treatment plan related to chronic health conditions  and patient's adherence to plan as established by provider Advised patient to provide appropriate vaccination information to provider or CM team member at next visit Advised patient to contact Legal Aid as a resource Reviewed medications with patient  Provided patient with information about Legal Aid and transportation Reviewed scheduled/upcoming provider appointments Discussed plans with patient for ongoing care management follow up and provided patient with direct contact information for care management team  SDOH Barriers (Status:  New goal.) Long Term Goal Patient interviewed and SDOH assessment performed        Patient interviewed and appropriate assessments performed Provided patient with information about Legal Aid and [facility] Transportation services. Discussed plans with patient for ongoing care management follow up and provided patient with direct contact information for care management team  Patient Goals/Self-Care Activities: Take all medications as prescribed Attend all scheduled provider appointments Call pharmacy for medication refills 3-7 days in advance of running out of medications Perform all  self care activities independently  Perform IADL's (shopping, preparing meals, housekeeping, managing finances) independently Call provider office for new concerns or questions  03/02/23: To reschedule eye appt-completed 05/02/23:  To f/u on dentist appt.  Follow Up Plan:  The patient has been provided with contact information for the care management team and has been advised to call with any health related questions or concerns.  The care management team will reach out to the patient again over the next 30  business  days.    Long-Range Goal: Establish Plan of Care for Chronic Disease Management Needs   Priority: High  Note:   Timeframe:  Long-Range  Goal Priority:  High Start Date:   02/27/22                          Expected End Date:ongoing                       Follow Up Date 07/05/23   - practice safe sex - schedule appointment for flu shot - schedule appointment for vaccines needed due to my age or health - schedule recommended health tests (blood work, mammogram, colonoscopy, pap test) - schedule and keep appointment for annual check-up    Why is this important?   Screening tests can find diseases early when they are easier to treat.  Your doctor or nurse will talk with you about which tests are important for you.  Getting shots for common diseases like the flu and shingles will help prevent them.   06/04/23:  Recent eye and Allergy/Asthma appt.   Follow Up:  Patient agrees to Care Plan and Follow-up.  Plan: The Managed Medicaid care management team will reach out to the patient again over the next 30 business  days. and The  Patient has been provided with contact information for the Managed Medicaid care management team and has been advised to call with any health related questions or concerns.  Date/time of next scheduled RN care management/care coordination outreach:  07/05/23 at 230.

## 2023-06-04 NOTE — Patient Instructions (Signed)
Hi Kelsey Bullock, I hope you have a good day and visit with your son goes okay-have a nice week!  Kelsey Bullock was given information about Medicaid Managed Care team care coordination services as a part of their [facility] Community Plan Medicaid benefit. Kelsey Bullock verbally consented to engagement with the [facility] Managed Care team.   If you are experiencing a medical emergency, please call 911 or report to your local emergency department or urgent care.   If you have a non-emergency medical problem during routine business hours, please contact your provider's office and ask to speak with a nurse.   For questions related to your [facility], please call: [phone] or visit the homepage here: [email or web address]  If you would like to schedule transportation through your [facility], please call the following number at least 2 days in advance of your appointment: [phone]   Rides for urgent appointments can also be made after hours by calling Member Services.  Call the Behavioral Health Crisis Line at [phone], at any time, 24 hours a day, 7 days a week. If you are in danger or need immediate medical attention call 911.  If you would like help to quit smoking, call 1-800-QUIT-NOW (3326539493) OR Espaol: 1-855-Djelo-Ya (3-557-322-0254) o para ms informacin haga clic aqu or Text READY to 270-623 to register via text  Kelsey Bullock - following are the goals we discussed in your visit today:   Goals Addressed    Timeframe:  Long-Range Goal Priority:  High Start Date:   02/27/22                          Expected End Date:ongoing                       Follow Up Date 07/05/23   - practice safe sex - schedule appointment for flu shot - schedule appointment for vaccines needed due to my age or health - schedule recommended health tests (blood work, mammogram,  colonoscopy, pap test) - schedule and keep appointment for annual check-up    Why is this important?   Screening tests can find diseases early when they are easier to treat.  Your doctor or nurse will talk with you about which tests are important for you.  Getting shots for common diseases like the flu and shingles will help prevent them.   06/04/23:  Recent eye and Allergy/Asthma appt.  Patient verbalizes understanding of instructions and care plan provided today and agrees to view in MyChart. Active MyChart status and patient understanding of how to access instructions and care plan via MyChart confirmed with patient.     The Managed Medicaid care management team will reach out to the patient again over the next 30 business  days.  The  Patient  has been provided with contact information for the Managed Medicaid care management team and has been advised to call with any health related questions or concerns.   Kelsey Der RN, BSN [place]  [facility] Care Management Coordinator - Managed Medicaid High Risk [phone]   Following is a copy of your plan of care:  Care Plan : RN Care Manager Plan of Care  Updates made by Danie Chandler, RN since 06/04/2023 12:00 AM     Problem: Health Promotion or Disease Self-Management (General Plan of Care)      Long-Range Goal: Chronic Disease Management and Care  Coordination Needs   Start Date: 02/27/2022  Expected End Date: 08/02/2023  Priority: High  Note:   Current Barriers:  Knowledge Deficits related to plan of care for management of CHF, GERD, asthma, osteoarthritis, constipation, sleep apnea Care Coordination needs related to financial constraints Chronic Disease Management support and education needs related to CHF, GERD, Asthma,osteoarthritis, constipation, sleep apnea. 06/04/23:  has not scheduled dental appt yet.  Eye appt okay-possible cataract surgery in the near future.  Breathing okay.  Concerned about cost of  food and food resources-BSW appt scheduled.    RNCM Clinical Goal(s):  Patient will verbalize understanding of plan for management of CHF, GERD, asthma, osteoarthritis, constipation, sleep apnea as evidenced by patient report verbalize basic understanding of  CHF, GERD, asthma, osteoarthritis, constipation, sleep apnea disease process and self health management plan as evidenced by patient report take all medications exactly as prescribed and will call provider for medication related questions as evidenced by patient report demonstrate understanding of rationale for each prescribed medication as evidenced by patient report attend all scheduled medical appointments as evidenced by patient report continue to work with RN Care Manager to address care management and care coordination needs related to  CHF, GERD, asthma, osteoarthritis, constipation, sleep apnea as evidenced by adherence to CM Team Scheduled appointments through collaboration with RN Care manager, provider, and care team.   Interventions: Inter-disciplinary care team collaboration (see longitudinal plan of care) Evaluation of current treatment plan related to  self management and patient's adherence to plan as established by provider Pharmacy referral for medication review Collaborated with Pharmacy BSW for housing questions  Collaborated with BSW 03/19/23:  RNCM returned patient's phone call. Patient upset, son tried to commit suicide last week-currently at [facility]. Emotional support and active listening provided. Patient has car now. RNCM and Managed Medicaid team will continue to follow.  04/02/23:  F/U BSW appt scheduled for this week as patient would like to talk to BSW-completed 06/04/23:  BSW appt scheduled for patient-concerned about food resources.    Asthma: (Status:New goal.) Long Term Goal Provided patient with basic written and verbal Asthma education on self care/management/and exacerbation  prevention Advised patient to track and manage Asthma triggers Provided education about and advised patient to utilize infection prevention strategies to reduce risk of respiratory infection Discussed the importance of adequate rest and management of fatigue with Asthma Assessed social determinant of health barriers   CAD Interventions: (Status:  New goal.) Long Term Goal Assessed understanding of CAD diagnosis Medications reviewed including medications utilized in CAD treatment plan Provided education on importance of blood pressure control in management of CAD Provided education on Importance of limiting foods high in cholesterol Counseled on importance of regular laboratory monitoring as prescribed Reviewed Importance of taking all medications as prescribed Reviewed Importance of attending all scheduled provider appointments Advised to report any changes in symptoms or exercise tolerance Assessed social determinant of health barriers  Heart Failure Interventions:  (Status:  New goal.) Long Term Goal Basic overview and discussion of pathophysiology of Heart Failure reviewed Discussed the importance of keeping all appointments with provider Assessed social determinant of health barriers     (Status:  New goal.)  Long Term Goal Evaluation of current treatment plan related to  CHF, GERD, asthma, osteoarthritis, constipation, sleep apnea , Transportation and Housing barriers self-management and patient's adherence to plan as established by provider. Discussed plans with patient for ongoing care management follow up and provided patient with direct contact information for care management team  Evaluation of current treatment plan related to chronic health conditions  and patient's adherence to plan as established by provider Advised patient to provide appropriate vaccination information to provider or CM team member at next visit Advised patient to contact Legal Aid as a resource Reviewed  medications with patient  Provided patient with information about Legal Aid and transportation Reviewed scheduled/upcoming provider appointments Discussed plans with patient for ongoing care management follow up and provided patient with direct contact information for care management team  SDOH Barriers (Status:  New goal.) Long Term Goal Patient interviewed and SDOH assessment performed        Patient interviewed and appropriate assessments performed Provided patient with information about Legal Aid and [facility] Transportation services. Discussed plans with patient for ongoing care management follow up and provided patient with direct contact information for care management team  Patient Goals/Self-Care Activities: Take all medications as prescribed Attend all scheduled provider appointments Call pharmacy for medication refills 3-7 days in advance of running out of medications Perform all self care activities independently  Perform IADL's (shopping, preparing meals, housekeeping, managing finances) independently Call provider office for new concerns or questions  03/02/23: To reschedule eye appt-completed 05/02/23:  To f/u on dentist appt.  Follow Up Plan:  The patient has been provided with contact information for the care management team and has been advised to call with any health related questions or concerns.  The care management team will reach out to the patient again over the next 30  business  days.

## 2023-06-05 DIAGNOSIS — F411 Generalized anxiety disorder: Secondary | ICD-10-CM | POA: Diagnosis not present

## 2023-06-05 DIAGNOSIS — F32A Depression, unspecified: Secondary | ICD-10-CM | POA: Diagnosis not present

## 2023-06-13 ENCOUNTER — Other Ambulatory Visit: Payer: Self-pay

## 2023-06-13 NOTE — Patient Instructions (Signed)
 Visit Information  Kelsey Bullock was given information about Medicaid Managed Care team care coordination services as a part of their [facility] Community Plan Medicaid benefit. [name] verbally consented to engagement with the [facility] Managed Care team.   If you are experiencing a medical emergency, please call 911 or report to your local emergency department or urgent care.   If you have a non-emergency medical problem during routine business hours, please contact your provider's office and ask to speak with a nurse.   For questions related to your [facility], please call: [phone] or visit the homepage here: [email or web address]  If you would like to schedule transportation through your [facility], please call the following number at least 2 days in advance of your appointment: [phone]   Rides for urgent appointments can also be made after hours by calling Member Services.  Call the Behavioral Health Crisis Line at [phone], at any time, 24 hours a day, 7 days a week. If you are in danger or need immediate medical attention call 911.  If you would like help to quit smoking, call 1-800-QUIT-NOW (2124206536) OR Espaol: 1-855-Djelo-Ya (8-841-660-6301) o para ms informacin haga clic aqu or Text READY to 601-093 to register via text  [name] - following are the goals we discussed in your visit today:   Goals Addressed   None      The  Patient                                              has been provided with contact information for the Managed Medicaid care management team and has been advised to call with any health related questions or concerns.   Gus Puma, Kenard Gower, MHA [facility]  Managed Medicaid Social Worker [phone]   Following is a copy of your plan of care:  There are no care plans that you recently modified to  display for this patient.

## 2023-06-13 NOTE — Patient Outreach (Signed)
 Medicaid Managed Care Social Work Note  06/13/2023 Name:  Kelsey Bullock MRN:  [id number] DOB:  11/06/1960  Kelsey Bullock is an 63 y.o. year old female who is a primary patient of Rumball, Alison M, DO.  The Medicaid Managed Care Coordination team was consulted for assistance with:  Community Resources   Ms. Koroma was given information about Medicaid Managed Care Coordination team services today. [name] Patient agreed to services and verbal consent obtained.  Engaged with patient  for by telephone forfollow up visit in response to referral for case management and/or care coordination services.   Patient is participating in a Managed Medicaid Plan:  Yes  Assessments/Interventions:  Review of past medical history, allergies, medications, health status, including review of consultants reports, laboratory and other test data, was performed as part of comprehensive evaluation and provision of chronic care management services.  SDOH: (Social Drivers of Health) assessments and interventions performed: SDOH Interventions    Flowsheet Row Patient Outreach Telephone from 06/04/2023 in [place] [facility] POP[facility] DEPART[facility] Patient Outreach Telephone from 05/02/2023 in [place] POPULATION HEALTH DEPARTMENT Patient Outreach Telephone from 04/02/2023 in [place] POPULATION HEALTH DEPARTMENT Patient Outreach Telephone from 03/19/2023 in [place] POPULATION HEALTH DEPARTMENT Patient Outreach Telephone from 03/02/2023 in [place] POPULATION HEALTH DEPARTMENT Patient Outreach Telephone from 01/01/2023 in [place] POPULATION HEALTH DEPARTMENT  SDOH Interventions        Food Insecurity Interventions -- Other (Comment)  [has resources and sister helps] -- -- -- --  Housing Interventions -- Other (Comment)  [patient given resources-caught up on rent at this time] -- -- -- --  Utilities Interventions -- -- -- -- Intervention Not Indicated --  Alcohol Usage Interventions  Intervention Not Indicated (Score <7) -- -- -- -- Intervention Not Indicated (Score <7)  Financial Strain Interventions -- -- -- Other (Comment)  [patient has been provided resources] -- --  Physical Activity Interventions -- -- Intervention Not Indicated -- -- --  Stress Interventions -- -- -- -- --  lulu therapy] --  Social Connections Interventions -- -- -- Intervention Not Indicated -- --  Health Literacy Interventions Intervention Not Indicated -- -- -- -- Intervention Not Indicated      BSW completed a telephone outreach with patient, she states she is still having issues with her car. She was able to pay rent for Jan but still has financial issues due to other bills, patient states she is still wanting to move. BSW has provided all available resources to patient. BSW provided patient with contact information for any future needs.  Advanced Directives Status:  Not addressed in this encounter.  Care Plan                 Allergies  Allergen Reactions   Amoxicillin Anaphylaxis and Swelling   Etodolac Anaphylaxis and Shortness Of Breath    lips swelled   lips swelled  lips swelled,  lips swelled   Penicillins Anaphylaxis, Swelling and Rash    DID THE REACTION INVOLVE: Swelling of the face/tongue/throat, SOB, or low BP? Yes Sudden or severe rash/hives, skin peeling, or the inside of the mouth or nose? No Did it require medical treatment? No When did it last happen?      20 years ago If all above answers are "NO", may proceed with cephalosporin use.    Latex Rash and Hives   Pantoprazole Sodium Hives    unspecified  REACTION: unspecified  unspecified, REACTION: unspecified   Wound Dressing Adhesive  Rash   Ampicillin Swelling   Augmentin [Amoxicillin-Pot Clavulanate] Swelling   Diclofenac  Sodium     Unknown reaction    Pantoprazole Sodium     unspecified   Adhesive [Tape] Rash    Medications Reviewed Today   Medications were not reviewed in this encounter      Patient Active Problem List   Diagnosis Date Noted   Encounter for colonoscopy due to history of adenomatous colonic polyps 02/04/2023   Folliculitis 04/20/2022   Generalized anxiety disorder 12/13/2021   Trigger finger of right thumb 09/06/2021   Primary osteoarthritis of left knee 08/15/2021   ETD (Eustachian tube dysfunction), bilateral 07/18/2021   Vaginal pruritus 07/18/2021   Photosensitivity dermatitis 01/06/2021   Recurrent ventral incisional hernia 11/25/2020   Ventral hernia 10/21/2020   Polypharmacy 10/21/2020   Dehydration 04/19/2020   Pap smear for cervical cancer screening 02/25/2020   Constipation 03/22/2019   Pilonidal cyst 02/22/2018   Diastolic CHF with preserved left ventricular function, NYHA class 2 (HCC) 12/14/2016   Rhinitis, chronic 02/06/2015   Severe persistent asthma 02/06/2015   Intertrigo 10/22/2014   Depression, major, recurrent (HCC) 02/19/2014   Obsessive-compulsive disorder 11/09/2012   Posttraumatic stress disorder 11/09/2012   GERD (gastroesophageal reflux disease) 12/06/2011   Fibromyalgia 12/06/2011   Irritable bowel syndrome 12/06/2011   Rosacea 12/06/2011   Osteoporosis 12/06/2011   Hypertension 12/06/2011   History of kidney stones 12/06/2011   Depression, unspecified 12/06/2011   Urinary incontinence 09/27/2011   Sleep apnea 03/10/2009   Seborrheic eczema 07/17/2007   Asthma 11/26/2006   Low back pain syndrome 11/26/2006   Hypercholesterolemia 08/02/2006   Obesity 08/02/2006   Anxiety state 08/02/2006   PSEUDOTUMOR CEREBRI 08/02/2006   OSTEOARTHRITIS, LOWER LEG 08/02/2006   FIBROMYALGIA, FIBROMYOSITIS 08/02/2006    Conditions to be addressed/monitored per PCP order:   community resources  There are no care plans that you recently modified to display for this patient.   Follow up:  Patient agrees to Care Plan and Follow-up.  Plan: The  Patient has been provided with contact information for the Managed Medicaid care  management team and has been advised to call with any health related questions or concerns.    Thersia Delene ROBINS, MHA [facility]  Managed [facility] Social Worker [phone]

## 2023-06-25 ENCOUNTER — Ambulatory Visit (INDEPENDENT_AMBULATORY_CARE_PROVIDER_SITE_OTHER): Payer: Medicaid Other

## 2023-06-25 DIAGNOSIS — J4550 Severe persistent asthma, uncomplicated: Secondary | ICD-10-CM | POA: Diagnosis not present

## 2023-07-02 ENCOUNTER — Other Ambulatory Visit: Payer: Self-pay | Admitting: Obstetrics and Gynecology

## 2023-07-02 NOTE — Patient Outreach (Signed)
Care Coordination  07/02/2023  KAYSEY BERNDT Apr 29, 1961 [id number]  RNCM returned patient's phone call.  Patient states she is having problems with her car and needs help with transportation to medical appointment.  Patient provided with [facility] transportation phone number.    Kathi Der RN, BSN, [employer] [facility] RN Care Manager Direct Dial [phone]/WGN [phone] Website: [name]

## 2023-07-02 NOTE — Patient Instructions (Signed)
Marland Kitchen

## 2023-07-05 ENCOUNTER — Other Ambulatory Visit: Payer: Self-pay | Admitting: Obstetrics and Gynecology

## 2023-07-05 NOTE — Patient Outreach (Signed)
Medicaid Managed Care   Nurse Care Manager Note  07/05/2023 Name:  Kelsey Bullock MRN:  [id number] DOB:  03/14/1961  Kelsey Bullock is an 63 y.o. year old female who is a primary patient of Kelsey Laroche, DO.  [facility] Managed Care Coordination team was consulted for assistance with:    Chronic healthcare management needs, CHF, GERD, asthma, rhinitis, OSA, osteoarthritis, LBP, LPRD, SDOH, anxiety/depression/PTSD/OCD  Kelsey Bullock was given information about Medicaid Managed Care Coordination team services today. Kelsey Bullock Patient agreed to services and verbal consent obtained.  Engaged with patient by telephone for follow up visit in response to provider referral for case management and/or care coordination services.   Patient is participating in a Managed Medicaid Plan:  Yes  Assessments/Interventions:  Review of past medical history, allergies, medications, health status, including review of consultants reports, laboratory and other test data, was performed as part of comprehensive evaluation and provision of chronic care management services.  SDOH (Social Drivers of Health) assessments and interventions performed: SDOH Interventions    Flowsheet Row Patient Outreach Telephone from 07/05/2023 in [place] [facility]PART[facility] Patient Outreach Telephone from 07/02/2023 in [place] POPULATION HEALTH DEPARTMENT Patient Outreach Telephone from 06/04/2023 in [place] POPULATION HEALTH DEPARTMENT Patient Outreach Telephone from 05/02/2023 in [place] POPULATION HEALTH DEPARTMENT Patient Outreach Telephone from 04/02/2023 in [place] POPULATION HEALTH DEPARTMENT Patient Outreach Telephone from 03/19/2023 in [place] POPULATION HEALTH DEPARTMENT  SDOH Interventions        Food Insecurity Interventions -- -- -- Other (Comment)  [has resources and sister helps] -- --  Housing Interventions -- -- -- Other (Comment)  [patient given resources-caught up on rent  at this time] -- --  Transportation Interventions -- Intervention Not Indicated -- -- -- --  Utilities Interventions Intervention Not Indicated Intervention Not Indicated -- -- -- --  Alcohol Usage Interventions -- -- Intervention Not Indicated (Score <7) -- -- --  Financial Strain Interventions -- -- -- -- -- Other (Comment)  [patient has been provided resources]  Physical Activity Interventions -- -- -- -- Intervention Not Indicated --  Stress Interventions Other (Comment)  [receives therapy services] -- -- -- -- --  Social Connections Interventions -- -- -- -- -- Intervention Not Indicated  Health Literacy Interventions -- -- Intervention Not Indicated -- -- --     Care Plan Allergies  Allergen Reactions   Amoxicillin Anaphylaxis and Swelling   Etodolac Anaphylaxis and Shortness Of Breath    lips swelled   lips swelled  lips swelled,  lips swelled   Penicillins Anaphylaxis, Swelling and Rash    DID THE REACTION INVOLVE: Swelling of the face/tongue/throat, SOB, or low BP? Yes Sudden or severe rash/hives, skin peeling, or the inside of the mouth or nose? No Did it require medical treatment? No When did it last happen?      20 years ago If all above answers are "NO", may proceed with cephalosporin use.    Latex Rash and Hives   Pantoprazole Sodium Hives    unspecified  REACTION: unspecified  unspecified, REACTION: unspecified   Wound Dressing Adhesive Rash   Ampicillin Swelling   Augmentin [Amoxicillin-Pot Clavulanate] Swelling   Diclofenac Sodium     Unknown reaction    Pantoprazole Sodium     unspecified   Adhesive [Tape] Rash   Medications Reviewed Today     Reviewed by Danie Chandler, RN (Registered Nurse) on 07/05/23 at 1437  Med List Status: <None>  Medication Order Taking? Sig Documenting Provider Last Dose Status Informant  albuterol (PROVENTIL) (2.5 MG/3ML) 0.083% nebulizer solution [id number]  Take 3 mLs (2.5 mg total) by nebulization every 4 (four) hours as  needed. USE 1 VIAL VIA NEBULIZER EVERY 4 TO 6 HOURS AS NEEDED FOR COUGH OR WHEEZING Kozlow, Alvira Philips, MD  Active   albuterol (VENTOLIN HFA) 108 (90 Base) MCG/ACT inhaler [id number]  Inhale 2 puffs into the lungs every 4 (four) hours as needed. Kozlow, Alvira Philips, MD  Active   atorvastatin (LIPITOR) 20 MG tablet [id number] No Take 1 tablet (20 mg total) by mouth daily. Nestor Ramp, MD Taking Active   cetirizine (ZYRTEC) 10 MG tablet [id number]  Take 1 tablet (10 mg total) by mouth 2 (two) times daily as needed for allergies (Can take an extra dose for breakthrough symptoms). Kozlow, Alvira Philips, MD  Active   cyclobenzaprine (FLEXERIL) 10 MG tablet [id number] No Take 1 tablet (10 mg total) by mouth 3 (three) times daily as needed for muscle spasms. Nestor Ramp, MD Taking Active   diclofenac Sodium (VOLTAREN) 1 % GEL [id number] No Apply 4 g topically 4 (four) times daily. Littie Deeds, MD Taking Active   EPINEPHrine (EPIPEN 2-PAK) 0.3 mg/0.3 mL IJ SOAJ injection [id number]  Inject 0.3 mg into the muscle as needed for anaphylaxis. Kozlow, Alvira Philips, MD  Active   famotidine (PEPCID) 20 MG tablet [id number]  Take 1 tablet (20 mg total) by mouth in the morning. Kozlow, Alvira Philips, MD  Active   fluticasone [facility]) 50 MCG/ACT nasal spray [id number]  Place 1 spray into both nostrils daily. Kozlow, Alvira Philips, MD  Active   ipratropium (ATROVENT) 0.06 % nasal spray [id number]  Place 2 sprays into both nostrils 4 (four) times daily. USE 2 SPRAYS INTO EACH NOSTRIL EVERY 6 HOURS AS NEEDED FOR TO DRY UP NOSE. Kozlow, Alvira Philips, MD  Active   ketoconazole (NIZORAL) 2 % shampoo [id number] No Apply 1 Application topically 2 (two) times a week. Nestor Ramp, MD Taking Active   levocetirizine (XYZAL) 5 MG tablet [id number]  Take 1 tablet (5 mg total) by mouth daily as needed for allergies (Can take an extra dose during flare ups.). Kozlow, Alvira Philips, MD  Active   linaclotide [name]) 72 MCG capsule [id number] No Take 2 capsules (144 mcg total) by mouth daily  before breakfast. Moses Manners, MD Taking Active   mometasone-formoterol [facility]) 200-5 MCG/ACT Sandrea Matte [id number]  Inhale 2 puffs into the lungs in the morning and at bedtime. Take twice daily for 1-2 weeks with respiratory illness or asthma flare. Kozlow, Alvira Philips, MD  Active   montelukast (SINGULAIR) 10 MG tablet [id number]  Take 1 tablet (10 mg total) by mouth at bedtime. Take one tablet by mouth at bedtime Kozlow, Alvira Philips, MD  Active   NYSTATIN powder [id number] No Apply topically 3 (three) times daily. [provider] Taking Active   Olopatadine HCl 0.7 % SOLN [id number] No Apply 1 drop to eye daily. [provider] Taking Active   Omega-3 Fatty Acids (FISH OIL) 1200 MG CAPS [id number] No Take 1 capsule by mouth. [provider] Taking Active   omeprazole (PRILOSEC) 40 MG capsule [id number] No Take 1 capsule (40 mg total) by mouth daily. Kelsey Laroche, DO Taking Active   Pyrithione Zinc (DHS ZINC) 2 % SHAM [id number] No Apply to scalp 3 to 5 times a week as ditreted Kelsey Laroche, DO Taking Active   Respiratory Therapy Supplies (NEBULIZER  MASK ADULT) MISC [id number]  1 Device by Does not apply route as needed. Jessica Priest, MD  Active   Spacer/Aero-Holding Chambers DEVI [id number]  Use as directed with inhaler. Kozlow, Alvira Philips, MD  Active   Specialty Vitamins Products (MG PLUS PROTEIN) 133 MG TABS [id number] No Take 1 tablet by mouth daily. [provider] Taking Active   tezepelumab-ekko (TEZSPIRE) 210 MG/1. syringe 210 mg [id number]   Jessica Priest, MD  Active   triamcinolone (KENALOG) 0.025 % ointment [id number] No Apply topically 2 (two) times daily. Nestor Ramp, MD Taking Active            Patient Active Problem List   Diagnosis Date Noted   Encounter for colonoscopy due to history of adenomatous colonic polyps 02/04/2023   Folliculitis 04/20/2022   Generalized anxiety disorder 12/13/2021   Trigger finger of right thumb 09/06/2021   Primary  osteoarthritis of left knee 08/15/2021   ETD (Eustachian tube dysfunction), bilateral 07/18/2021   Vaginal pruritus 07/18/2021   Photosensitivity dermatitis 01/06/2021   Recurrent ventral incisional hernia 11/25/2020   Ventral hernia 10/21/2020   Polypharmacy 10/21/2020   Dehydration 04/19/2020   Pap smear for cervical cancer screening 02/25/2020   Constipation 03/22/2019   Pilonidal cyst 02/22/2018   Diastolic CHF with preserved left ventricular function, NYHA class 2 (HCC) 12/14/2016   Rhinitis, chronic 02/06/2015   Severe persistent asthma 02/06/2015   Intertrigo 10/22/2014   Depression, major, recurrent (HCC) 02/19/2014   Obsessive-compulsive disorder 11/09/2012   Posttraumatic stress disorder 11/09/2012   GERD (gastroesophageal reflux disease) 12/06/2011   Fibromyalgia 12/06/2011   Irritable bowel syndrome 12/06/2011   Rosacea 12/06/2011   Osteoporosis 12/06/2011   Hypertension 12/06/2011   History of kidney stones 12/06/2011   Depression, unspecified 12/06/2011   Urinary incontinence 09/27/2011   Sleep apnea 03/10/2009   Seborrheic eczema 07/17/2007   Asthma 11/26/2006   Low back pain syndrome 11/26/2006   Hypercholesterolemia 08/02/2006   Obesity 08/02/2006   Anxiety state 08/02/2006   PSEUDOTUMOR CEREBRI 08/02/2006   OSTEOARTHRITIS, LOWER LEG 08/02/2006   FIBROMYALGIA, FIBROMYOSITIS 08/02/2006   Conditions to be addressed/monitored per PCP order:  Chronic healthcare management needs, CHF, GERD, asthma, rhinitis, OSA, osteoarthritis, LBP, LPRD, SDOH, anxiety/depression/PTSD/OCD  Care Plan : RN Care Manager Plan of Care  Updates made by Danie Chandler, RN since 07/05/2023 12:00 AM     Problem: Health Promotion or Disease Self-Management (General Plan of Care)      Long-Range Goal: Chronic Disease Management and Care Coordination Needs   Start Date: 02/27/2022  Expected End Date: 08/02/2023  Priority: High  Note:   Current Barriers:  Knowledge Deficits related  to plan of care for management of CHF, GERD, asthma, osteoarthritis, constipation, sleep apnea Care Coordination needs related to financial constraints Chronic Disease Management support and education needs related to CHF, GERD, Asthma,osteoarthritis, constipation, sleep apnea. 07/05/23: Resources received from [place].  Feeling down-discussed appt with LCSW and declined.  Sees Psychiatry and therapist.  Breathing WNL.  Sometimes unable to pick  up meds or afford meds-car troubles.  Discussed waiving of fees, medication changes, [facility] outpatient pharmacy as an option with charge program.  Disussed grocery delivery as an option.    RNCM Clinical Goal(s):  Patient will verbalize understanding of plan for management of CHF, GERD, asthma, osteoarthritis, constipation, sleep apnea as evidenced by patient report verbalize basic understanding of  CHF, GERD, asthma, osteoarthritis, constipation, sleep apnea disease process and self health management plan as evidenced by  patient report take all medications exactly as prescribed and will call provider for medication related questions as evidenced by patient report demonstrate understanding of rationale for each prescribed medication as evidenced by patient report attend all scheduled medical appointments as evidenced by patient report continue to work with RN Care Manager to address care management and care coordination needs related to  CHF, GERD, asthma, osteoarthritis, constipation, sleep apnea as evidenced by adherence to CM Team Scheduled appointments through collaboration with RN Care manager, provider, and care team.   Interventions: Inter-disciplinary care team collaboration (see longitudinal plan of care) Evaluation of current treatment plan related to  self management and patient's adherence to plan as established by provider Pharmacy referral for medication review Collaborated with Pharmacy BSW for housing questions  Collaborated with BSW 07/02/23: RNCM  returned patient's phone call.  Patient states she is having problems with her car and needs help with transportation to medical appointment.  Patient provided with [facility] transportation phone number.   07/05/23:  See above mentioned items for consideration regarding meds and transportation.  Asthma: (Status:New goal.) Long Term Goal Provided patient with basic written and verbal Asthma education on self care/management/and exacerbation prevention Advised patient to track and manage Asthma triggers Provided education about and advised patient to utilize infection prevention strategies to reduce risk of respiratory infection Discussed the importance of adequate rest and management of fatigue with Asthma Assessed social determinant of health barriers   CAD Interventions: (Status:  New goal.) Long Term Goal Assessed understanding of CAD diagnosis Medications reviewed including medications utilized in CAD treatment plan Provided education on importance of blood pressure control in management of CAD Provided education on Importance of limiting foods high in cholesterol Counseled on importance of regular laboratory monitoring as prescribed Reviewed Importance of taking all medications as prescribed Reviewed Importance of attending all scheduled provider appointments Advised to report any changes in symptoms or exercise tolerance Assessed social determinant of health barriers  Heart Failure Interventions:  (Status:  New goal.) Long Term Goal Basic overview and discussion of pathophysiology of Heart Failure reviewed Discussed the importance of keeping all appointments with provider Assessed social determinant of health barriers     (Status:  New goal.)  Long Term Goal Evaluation of current treatment plan related to  CHF, GERD, asthma, osteoarthritis, constipation, sleep apnea , Transportation and Housing barriers self-management and patient's adherence to plan as established by provider. Discussed  plans with patient for ongoing care management follow up and provided patient with direct contact information for care management team Evaluation of current treatment plan related to chronic health conditions  and patient's adherence to plan as established by provider Advised patient to provide appropriate vaccination information to provider or CM team member at next visit Advised patient to contact Legal Aid as a resource Reviewed medications with patient  Provided patient with information about Legal Aid and transportation Reviewed scheduled/upcoming provider appointments Discussed plans with patient for ongoing care management follow up and provided patient with direct contact information for care management team  SDOH Barriers (Status:  New goal.) Long Term Goal Patient interviewed and SDOH assessment performed        Patient interviewed and appropriate assessments performed Provided patient with information about Legal Aid and [facility] Transportation services. Discussed plans with patient for ongoing care management follow up and provided patient with direct contact information for care management team  Patient Goals/Self-Care Activities: Take all medications as prescribed Attend all scheduled provider appointments Call pharmacy for medication refills 3-7  days in advance of running out of medications Perform all self care activities independently  Perform IADL's (shopping, preparing meals, housekeeping, managing finances) independently Call provider office for new concerns or questions  03/02/23: To reschedule eye appt-completed 05/02/23:  To f/u on dentist appt.  Follow Up Plan:  The patient has been provided with contact information for the care management team and has been advised to call with any health related questions or concerns.  The care management team will reach out to the patient again over the next 60  business  days.    Long-Range Goal: Establish Plan of Care for Chronic  Disease Management Needs   Priority: High  Note:   Timeframe:  Long-Range Goal Priority:  High Start Date:   02/27/22                          Expected End Date:ongoing                       Follow Up Date 09/02/23   - practice safe sex - schedule appointment for flu shot - schedule appointment for vaccines needed due to my age or health - schedule recommended health tests (blood work, mammogram, colonoscopy, pap test) - schedule and keep appointment for annual check-up    Why is this important?   Screening tests can find diseases early when they are easier to treat.  Your doctor or nurse will talk with you about which tests are important for you.  Getting shots for common diseases like the flu and shingles will help prevent them.   07/05/23: Continues therapy appts-has upcoming Allergy and Asthma appt   Follow Up:  Patient agrees to Care Plan and Follow-up.  Plan: The Managed Medicaid care management team will reach out to the patient again over the next 60 business days. and The  Patient has been provided with contact information for the Managed Medicaid care management team and has been advised to call with any health related questions or concerns.  Date/time of next scheduled RN care management/care coordination outreach:  09/02/23 at 0900.

## 2023-07-05 NOTE — Patient Instructions (Signed)
Hi Ms. Bramble, please reach out as needed-have a good rest of the day!  Ms. Paolella was given information about Medicaid Managed Care team care coordination services as a part of their [facility] Community Plan Medicaid benefit. YOUSRA IVENS verbally consented to engagement with the [facility] Managed Care team.   If you are experiencing a medical emergency, please call 911 or report to your local emergency department or urgent care.   If you have a non-emergency medical problem during routine business hours, please contact your provider's office and ask to speak with a nurse.   For questions related to your [facility], please call: [phone] or visit the homepage here: [email or web address]  If you would like to schedule transportation through your [facility], please call the following number at least 2 days in advance of your appointment: [phone]   Rides for urgent appointments can also be made after hours by calling Member Services.  Call the Behavioral Health Crisis Line at [phone], at any time, 24 hours a day, 7 days a week. If you are in danger or need immediate medical attention call 911.  If you would like help to quit smoking, call 1-800-QUIT-NOW (939-043-0056) OR Espaol: 1-855-Djelo-Ya (1-324-401-0272) o para ms informacin haga clic aqu or Text READY to 536-644 to register via text  Ms. Mareno - following are the goals we discussed in your visit today:   Goals Addressed    Timeframe:  Long-Range Goal Priority:  High Start Date:   02/27/22                          Expected End Date:ongoing                       Follow Up Date 09/02/23   - practice safe sex - schedule appointment for flu shot - schedule appointment for vaccines needed due to my age or health - schedule recommended health tests (blood work, mammogram, colonoscopy, pap  test) - schedule and keep appointment for annual check-up    Why is this important?   Screening tests can find diseases early when they are easier to treat.  Your doctor or nurse will talk with you about which tests are important for you.  Getting shots for common diseases like the flu and shingles will help prevent them.   07/05/23: Continues therapy appts-has upcoming Allergy and Asthma appt  Patient verbalizes understanding of instructions and care plan provided today and agrees to view in MyChart. Active MyChart status and patient understanding of how to access instructions and care plan via MyChart confirmed with patient.     The Managed Medicaid care management team will reach out to the patient again over the next 60 business days.  The  Patient  has been provided with contact information for the Managed Medicaid care management team and has been advised to call with any health related questions or concerns.   Kathi Der RN, BSN, [employer] [facility] RN Care Manager Direct Dial [phone]/LOV [phone] Website: [name]   Following is a copy of your plan of care:  Care Plan : RN Care Manager Plan of Care  Updates made by Danie Chandler, RN since 07/05/2023 12:00 AM     Problem: Health Promotion or Disease Self-Management (General Plan of Care)      Long-Range Goal: Chronic Disease Management and Care Coordination Needs   Start  Date: 02/27/2022  Expected End Date: 08/02/2023  Priority: High  Note:   Current Barriers:  Knowledge Deficits related to plan of care for management of CHF, GERD, asthma, osteoarthritis, constipation, sleep apnea Care Coordination needs related to financial constraints Chronic Disease Management support and education needs related to CHF, GERD, Asthma,osteoarthritis, constipation, sleep apnea. 07/05/23: Resources received from [place].  Feeling down-discussed appt with LCSW and declined.  Sees Psychiatry and therapist.   Breathing WNL.  Sometimes unable to pick  up meds or afford meds-car troubles.  Discussed waiving of fees, medication changes, [facility] outpatient pharmacy as an option with charge program.  Disussed grocery delivery as an option.    RNCM Clinical Goal(s):  Patient will verbalize understanding of plan for management of CHF, GERD, asthma, osteoarthritis, constipation, sleep apnea as evidenced by patient report verbalize basic understanding of  CHF, GERD, asthma, osteoarthritis, constipation, sleep apnea disease process and self health management plan as evidenced by patient report take all medications exactly as prescribed and will call provider for medication related questions as evidenced by patient report demonstrate understanding of rationale for each prescribed medication as evidenced by patient report attend all scheduled medical appointments as evidenced by patient report continue to work with RN Care Manager to address care management and care coordination needs related to  CHF, GERD, asthma, osteoarthritis, constipation, sleep apnea as evidenced by adherence to CM Team Scheduled appointments through collaboration with RN Care manager, provider, and care team.   Interventions: Inter-disciplinary care team collaboration (see longitudinal plan of care) Evaluation of current treatment plan related to  self management and patient's adherence to plan as established by provider Pharmacy referral for medication review Collaborated with Pharmacy BSW for housing questions  Collaborated with BSW 07/02/23: RNCM returned patient's phone call.  Patient states she is having problems with her car and needs help with transportation to medical appointment.  Patient provided with [facility] transportation phone number.   07/05/23:  See above mentioned items for consideration regarding meds and transportation.  Asthma: (Status:New goal.) Long Term Goal Provided patient with basic written and verbal Asthma education on  self care/management/and exacerbation prevention Advised patient to track and manage Asthma triggers Provided education about and advised patient to utilize infection prevention strategies to reduce risk of respiratory infection Discussed the importance of adequate rest and management of fatigue with Asthma Assessed social determinant of health barriers   CAD Interventions: (Status:  New goal.) Long Term Goal Assessed understanding of CAD diagnosis Medications reviewed including medications utilized in CAD treatment plan Provided education on importance of blood pressure control in management of CAD Provided education on Importance of limiting foods high in cholesterol Counseled on importance of regular laboratory monitoring as prescribed Reviewed Importance of taking all medications as prescribed Reviewed Importance of attending all scheduled provider appointments Advised to report any changes in symptoms or exercise tolerance Assessed social determinant of health barriers  Heart Failure Interventions:  (Status:  New goal.) Long Term Goal Basic overview and discussion of pathophysiology of Heart Failure reviewed Discussed the importance of keeping all appointments with provider Assessed social determinant of health barriers     (Status:  New goal.)  Long Term Goal Evaluation of current treatment plan related to  CHF, GERD, asthma, osteoarthritis, constipation, sleep apnea , Transportation and Housing barriers self-management and patient's adherence to plan as established by provider. Discussed plans with patient for ongoing care management follow up and provided patient with direct contact information for care management team Evaluation of current  treatment plan related to chronic health conditions  and patient's adherence to plan as established by provider Advised patient to provide appropriate vaccination information to provider or CM team member at next visit Advised patient to contact  Legal Aid as a resource Reviewed medications with patient  Provided patient with information about Legal Aid and transportation Reviewed scheduled/upcoming provider appointments Discussed plans with patient for ongoing care management follow up and provided patient with direct contact information for care management team  SDOH Barriers (Status:  New goal.) Long Term Goal Patient interviewed and SDOH assessment performed        Patient interviewed and appropriate assessments performed Provided patient with information about Legal Aid and [facility] Transportation services. Discussed plans with patient for ongoing care management follow up and provided patient with direct contact information for care management team  Patient Goals/Self-Care Activities: Take all medications as prescribed Attend all scheduled provider appointments Call pharmacy for medication refills 3-7 days in advance of running out of medications Perform all self care activities independently  Perform IADL's (shopping, preparing meals, housekeeping, managing finances) independently Call provider office for new concerns or questions  03/02/23: To reschedule eye appt-completed 05/02/23:  To f/u on dentist appt.  Follow Up Plan:  The patient has been provided with contact information for the care management team and has been advised to call with any health related questions or concerns.  The care management team will reach out to the patient again over the next 60  business  days.

## 2023-07-06 ENCOUNTER — Telehealth: Payer: Self-pay | Admitting: Family Medicine

## 2023-07-06 MED ORDER — DHS ZINC 2 % EX SHAM: MEDICATED_SHAMPOO | CUTANEOUS | 3 refills | Status: AC

## 2023-07-06 NOTE — Telephone Encounter (Signed)
 Done

## 2023-07-23 ENCOUNTER — Ambulatory Visit: Payer: Medicaid Other

## 2023-07-23 DIAGNOSIS — J4550 Severe persistent asthma, uncomplicated: Secondary | ICD-10-CM | POA: Diagnosis not present

## 2023-08-20 ENCOUNTER — Ambulatory Visit: Payer: Medicaid Other | Admitting: *Deleted

## 2023-08-20 ENCOUNTER — Telehealth: Payer: Self-pay | Admitting: *Deleted

## 2023-08-20 DIAGNOSIS — J4550 Severe persistent asthma, uncomplicated: Secondary | ICD-10-CM

## 2023-08-20 DIAGNOSIS — J3089 Other allergic rhinitis: Secondary | ICD-10-CM

## 2023-08-20 NOTE — Telephone Encounter (Signed)
 Patient has been having issues with cockroaches in her apartment and has been trying to get rid of them. She said she is trying more natural approaches to get rid of them but she also has boric acid and a but spray that she is spraying around her house to try and kill them. She states that she has been getting these bumps on her arms that are itchy and very dark in color starting out then get lighter and she notices that she has been losing hair at the site of the bumps. She wasn't sure if it was from the Tezspire injections, which she has not had any issues with, or if maybe she is allergic to the cockroaches. She was wondering if we could do labs to see if she may be allergic to the cockroaches to try and rule that out.

## 2023-08-20 NOTE — Telephone Encounter (Signed)
 Just to be on the safe side, what diagnosis would you like for me to use for these labs?

## 2023-08-21 DIAGNOSIS — J3089 Other allergic rhinitis: Secondary | ICD-10-CM | POA: Diagnosis not present

## 2023-08-21 DIAGNOSIS — J4550 Severe persistent asthma, uncomplicated: Secondary | ICD-10-CM | POA: Diagnosis not present

## 2023-08-21 NOTE — Telephone Encounter (Signed)
 Labs have been ordered and given to [place]. Called the patient and advised, patient verbalized understanding and will come by to get labs drawn soon.

## 2023-08-21 NOTE — Addendum Note (Signed)
 Addended by: Dollene Cleveland R on: 08/21/2023 12:08 PM   Modules accepted: Orders

## 2023-08-27 LAB — ALLERGENS W/TOTAL IGE AREA 2
Alternaria Alternata IgE: <0.1 kU/L
Aspergillus Fumigatus IgE: <0.1 kU/L
Bermuda Grass IgE: <0.1 kU/L
Cat Dander IgE: <0.1 kU/L
Cedar, Mountain IgE: <0.1 kU/L
Cladosporium Herbarum IgE: <0.1 kU/L
Cockroach, German IgE: <0.1 kU/L
Common Silver Birch IgE: <0.1 kU/L
Cottonwood IgE: <0.1 kU/L
D Farinae IgE: <0.1 kU/L
D Pteronyssinus IgE: <0.1 kU/L
Dog Dander IgE: <0.1 kU/L
Elm, American IgE: <0.1 kU/L
IgE (Immunoglobulin E), Serum: 5 [IU]/mL — ABNORMAL LOW (ref 6–495)
Johnson Grass IgE: <0.1 kU/L
Maple/Box Elder IgE: <0.1 kU/L
Mouse Urine IgE: <0.1 kU/L
Oak, White IgE: <0.1 kU/L
Pecan, Hickory IgE: <0.1 kU/L
Penicillium Chrysogen IgE: <0.1 kU/L
Pigweed, Rough IgE: <0.1 kU/L
Ragweed, Short IgE: <0.1 kU/L
Sheep Sorrel IgE Qn: <0.1 kU/L
Timothy Grass IgE: <0.1 kU/L
White Mulberry IgE: <0.1 kU/L

## 2023-08-31 ENCOUNTER — Ambulatory Visit: Payer: Self-pay | Admitting: Obstetrics and Gynecology

## 2023-08-31 ENCOUNTER — Telehealth: Payer: Self-pay | Admitting: Allergy and Immunology

## 2023-08-31 ENCOUNTER — Telehealth: Payer: Self-pay | Admitting: *Deleted

## 2023-08-31 ENCOUNTER — Telehealth: Payer: Self-pay

## 2023-08-31 DIAGNOSIS — J45909 Unspecified asthma, uncomplicated: Secondary | ICD-10-CM

## 2023-08-31 NOTE — Telephone Encounter (Signed)
 Called and informed patient that Dr. Lucie Leather has yet to dictate her results. Once dictated we'd call her back and let her know of his dictation. Patient also stated that she has bumps on her arm that are bluish-purple and they her skin. Denied that they were hives.

## 2023-08-31 NOTE — Telephone Encounter (Signed)
 Returned patients phone call about lab results. No answer, LVM.

## 2023-08-31 NOTE — Telephone Encounter (Signed)
 Patient called and stated that she would like a call back to over her recent labs she  had drawn. Patients call back number is [phone]

## 2023-08-31 NOTE — Progress Notes (Signed)
 Complex Care Management Care Guide Note  08/31/2023 Name: Kelsey Bullock MRN: [id number] DOB: July 30, 1960  Kelsey Bullock is a 63 y.o. year old female who is a primary care patient of [name], DO and is actively engaged with the care management team. I reached out to [name] by phone today to assist with re-scheduling  with the RN Case Manager.  Follow up plan: Telephone appointment with complex care management team member scheduled for:  3/31 with RNCM   Call scheduled with BSW 4/2  Gwenevere Ghazi  [facility], [facility] Guide  Direct Dial: [phone]  Fax [phone]

## 2023-09-03 ENCOUNTER — Telehealth: Payer: Self-pay

## 2023-09-03 ENCOUNTER — Other Ambulatory Visit: Payer: Self-pay

## 2023-09-03 NOTE — Telephone Encounter (Signed)
 Copied from CRM [id number]. Topic: Clinical - Medical Advice >> Sep 03, 2023 10:57 AM Shelah Lewandowsky wrote: Reason for CRM: Kelsey Bullock manager with [facility]- reporting ankle swelling of both ankles no pitting edema- red and warm- cough, no fever reported- patient not taking meds - [phone]

## 2023-09-03 NOTE — Telephone Encounter (Signed)
 Not a [facility] patient. Sending error report to E2C2.

## 2023-09-03 NOTE — Patient Outreach (Signed)
 Care Coordination   Follow Up Visit Note   09/03/2023 Name: Kelsey Bullock MRN: [id number] DOB: 1960/09/03  Kelsey Bullock is a 63 y.o. year old female who sees [name], DO for primary care. I spoke with  [name] by phone today.  What matters to the patients health and wellness today?  Patient reports recent issue with roaches in her apartment. Per chart review, she has been in contact with Allergy regarding bumps on her arms that are dark in color. Patient reports her skin feels like it is on fire. Patient reports bilateral ankle swelling, left possibly greater than right. States that edema is non-pitting. Patient reports ankles are red and warm to the touch. Denies open cuts or wounds. Reports increased nonproductive cough and "stuff running down my throat." States that she feels like her lungs are clear and denies worsening shortness of breath. Reports she does not check her weight. She does have a scale but is unsure where it is. Does not check her blood pressure. Patient reports she has recently added bread back into her diet and has been eating more out of frustration regarding current living situation. States she is not taking diuretics. Patient reports she is only taking medications on an as needed basis due to difficulty affording all of her medications. Reports using Albuterol 1-2 times a week. States she does not wear a CPAP. Reports exercising "some" by moving around frequently throughout the day. Reports that she stiffens up if she stays still for too long.    Goals Addressed             This Visit's Progress    Management and education of health conditions       Interventions Today    Flowsheet Row Most Recent Value  Chronic Disease   Chronic disease during today's visit Congestive Heart Failure (CHF), Other  [Asthma, CAD, GERD, sleep apnea]  General Interventions   General Interventions Discussed/Reviewed General Interventions Reviewed, Communication  with  [Evaluation of current treatment plan for listed health conditions and patient's adherence to plan as established by provider. Confirmed patient is able to make upcoming rent payment.]  Communication with PCP/Specialists  [Call placed to primary provider office to report symptoms of bilateral ankle swelling, redness, and warmth. Requested they call patient to schedule acute visit.]  Exercise Interventions   Exercise Discussed/Reviewed Exercise Reviewed  [Activity level reviewed, encouraged increased physical activity.]  Education Interventions   Education Provided Provided Education  [Educated on causes of fluid retention. Educated on symptoms of fluid retention. Advised patient locate scale and weigh daily first thing in the morning.]  Provided Verbal Education On Exercise  Pharmacy Interventions   Pharmacy Dicussed/Reviewed Pharmacy Topics Reviewed, Medication Adherence, Affording Medications  [Advised patient ask pharmacist for medication coupons. Advised patient to look at expiration date on Epipen and replace if expired.]  Medication Adherence Not taking medication  [Encouraged patient to take medications as ordered, espeically Atorvastatin. Reported to primary provider patient report of taking medications on an as needed basis. Educated on risks of medication nonadherence.]              SDOH assessments and interventions completed:  Yes     Care Coordination Interventions:  Yes, provided   Follow up plan: Follow up call scheduled for 10/16/23 at 11 AM with Kelsey Bullock    Encounter Outcome:  Patient Visit Completed

## 2023-09-03 NOTE — Patient Instructions (Signed)
 Visit Information  Thank you for taking time to visit with me today. Please don't hesitate to contact me if I can be of assistance to you.   Following are the goals we discussed today:   Goals Addressed             This Visit's Progress    Management and education of health conditions       Interventions Today    Flowsheet Row Most Recent Value  Chronic Disease   Chronic disease during today's visit Congestive Heart Failure (CHF), Other  [Asthma, CAD, GERD, sleep apnea]  General Interventions   General Interventions Discussed/Reviewed General Interventions Reviewed, Communication with  [Evaluation of current treatment plan for listed health conditions and patient's adherence to plan as established by provider. Confirmed patient is able to make upcoming rent payment.]  Communication with PCP/Specialists  [Call placed to primary provider office to report symptoms of bilateral ankle swelling, redness, and warmth. Requested they call patient to schedule acute visit.]  Exercise Interventions   Exercise Discussed/Reviewed Exercise Reviewed  [Activity level reviewed, encouraged increased physical activity.]  Education Interventions   Education Provided Provided Education  [Educated on causes of fluid retention. Educated on symptoms of fluid retention. Advised patient locate scale and weigh daily first thing in the morning.]  Provided Verbal Education On Exercise  Pharmacy Interventions   Pharmacy Dicussed/Reviewed Pharmacy Topics Reviewed, Medication Adherence, Affording Medications  [Advised patient ask pharmacist for medication coupons. Advised patient to look at expiration date on Epipen and replace if expired.]  Medication Adherence Not taking medication  [Encouraged patient to take medications as ordered, espeically Atorvastatin. Reported to primary provider patient report of taking medications on an as needed basis. Educated on risks of medication nonadherence.]              Our  next appointment is by telephone on 10/16/23 at 11 AM with Juanell Fairly  Please call the care guide team at [phone] if you need to cancel or reschedule your appointment.   If you are experiencing a Mental Health or Behavioral Health Crisis or need someone to talk to, please call the Suicide and Crisis Lifeline: 988 call 1-800-273-TALK (toll free, 24 hour hotline)  Patient verbalizes understanding of instructions and care plan provided today and agrees to view in MyChart. Active MyChart status and patient understanding of how to access instructions and care plan via MyChart confirmed with patient.     Ewing Schlein, RN MSN [place]  [facility] Population Health RN Care Manager Direct Dial: [phone]  Fax: [phone]

## 2023-09-04 ENCOUNTER — Telehealth: Payer: Self-pay

## 2023-09-04 NOTE — Telephone Encounter (Signed)
 Received call from Marcelino Duster, RN with [facility] regarding patient.   Patient reports experiencing BLE edema and non productive cough.   Advised that patient would need an appointment for further evaluation.   Called patient. Patient denies shortness of breath or chest pain.   Scheduled for appointment tomorrow morning with Dr. Claudean Severance.   Veronda Prude, RN

## 2023-09-05 ENCOUNTER — Other Ambulatory Visit: Payer: Self-pay | Admitting: Student

## 2023-09-05 ENCOUNTER — Ambulatory Visit: Payer: Self-pay

## 2023-09-05 ENCOUNTER — Other Ambulatory Visit: Payer: Self-pay | Admitting: Family Medicine

## 2023-09-05 ENCOUNTER — Ambulatory Visit (INDEPENDENT_AMBULATORY_CARE_PROVIDER_SITE_OTHER): Admitting: Student

## 2023-09-05 ENCOUNTER — Encounter: Payer: Self-pay | Admitting: Student

## 2023-09-05 VITALS — BP 151/63 | HR 66 | Temp 98.1°F | Ht 63.0 in | Wt 319.1 lb

## 2023-09-05 DIAGNOSIS — R600 Localized edema: Secondary | ICD-10-CM

## 2023-09-05 DIAGNOSIS — R051 Acute cough: Secondary | ICD-10-CM

## 2023-09-05 DIAGNOSIS — L309 Dermatitis, unspecified: Secondary | ICD-10-CM

## 2023-09-05 MED ORDER — FUROSEMIDE 20 MG PO TABS: ORAL_TABLET | ORAL | 0 refills | Status: DC

## 2023-09-05 MED ORDER — TRIAMCINOLONE ACETONIDE 0.025 % EX OINT
TOPICAL_OINTMENT | Freq: Two times a day (BID) | CUTANEOUS | 5 refills | Status: AC
Start: 2023-09-05 — End: ?

## 2023-09-05 NOTE — Patient Instructions (Addendum)
 It was great to see you! Thank you for allowing me to participate in your care!   I recommend that you always bring your medications to each appointment as this makes it easy to ensure we are on the correct medications and helps [place] not miss when refills are needed.  Our plans for today:  - Please take 1 tablet of lasix every day for 7 days, then take it as needed for leg swelling - Please make follow-up appointment for 2 weeks  We are checking some labs today, I will call you if they are abnormal will send you a MyChart message or a letter if they are normal.  If you do not hear about your labs in the next 2 weeks please let us know.  Take care and seek immediate care sooner if you develop any concerns. Please remember to show up 15 minutes before your scheduled appointment time!  Tiffany Kocher, DO [facility]

## 2023-09-05 NOTE — Patient Instructions (Signed)
 Visit Information  Thank you for taking time to visit with me today. Please don't hesitate to contact me if I can be of assistance to you.   Following are the goals we discussed today:  Patient will contact food banks for additional assistance. Patient will encourage her son to apply for disability. Patient will await update from [employer] for Transportation. Patient will pay priority bills first to avoid crisis.   Our next appointment is by telephone on 10/03/23 at 11am with Gus Puma.  Please call the care guide team at [phone] if you need to cancel or reschedule your appointment.   If you are experiencing a Mental Health or Behavioral Health Crisis or need someone to talk to, please call 911  Patient verbalizes understanding of instructions and care plan provided today and agrees to view in MyChart. Active MyChart status and patient understanding of how to access instructions and care plan via MyChart confirmed with patient.     Telephone follow up appointment with care management team member scheduled for:10/03/23 at 11am with Gus Puma.   Lysle Morales, BSW [place]  [facility], [facility] Social Worker Direct Dial: [phone]  Fax: [phone] Website: [name]

## 2023-09-05 NOTE — Progress Notes (Signed)
  SUBJECTIVE:   CHIEF COMPLAINT / HPI:   Cough  Leg Edema  Rash on left arm  Patient has severe asthma, follows with allergy.  Receives monthly Tezspire injections.  However, patient has not been taking her inhalers as prescribed.  She is only using Dulera as needed, taking montelukast as needed, Zyrtec as needed.  She has a dry, nonproductive cough.  Denies shortness of breath, wheezing, chest pain.  Patient has bilateral leg edema.  Patient is 11 pounds heavier than visit in December.  She has known diastolic heart failure, used to take Lasix however discontinued medication approximately 1 year ago after being told she no longer needed it with weight loss.  She has been raising her legs without significant improvement.  She is not wearing TED hose.  Rash on left arm.  Pruritic.  Patient reports that she has an issue with bugs in her home.  She has not been using any ointment.   OBJECTIVE:   BP (!) 150/57   Pulse 66   Temp 98.1 F (36.7 C) (Oral)   Ht 5\' 3"  (1.6 m)   Wt (!) 319 lb 2 oz (144.8 kg)   LMP 02/03/2019 Comment: Tubal Ligation  SpO2 100%   BMI 56.53 kg/m    General: NAD, pleasant Cardio: RRR, no MRG. Cap Refill <2s. Respiratory: CTAB, normal wob on RA GI: Abdomen is soft, not tender, not distended. BS present Skin: Erythematous, blanching, not scaly, rash primarily on left arm.  Additionally has ecchymosis.  Picture of right arm for comparison.       ASSESSMENT/PLAN:   Assessment & Plan Dermatitis Differential: Contact dermatitis, atopic dermatitis, folliculitis, tinea. -Trial triamcinolone ointment - Consider KOH plan next visit if not improving Bilateral leg edema Chronic, acutely worsening. - Lasix 20 mg daily for 7 days, then as needed - Follow-up in 2 weeks Acute cough Likely in setting of medication noncompliance.  Discussed on medication adherence.  Not in acute exacerbation of asthma today, CTAB.   Tiffany Kocher, DO [facility]  [facility]

## 2023-09-05 NOTE — Patient Outreach (Signed)
 Care Coordination   Follow Up Visit Note   09/05/2023 Name: JOZALYNN NOYCE MRN: [id number] DOB: 1960-11-04  DACY ENRICO is a 63 y.o. year old female who sees [name], DO for primary care. I spoke with  [name] by phone today.  What matters to the patients health and wellness today?  Visit starts late as patient is in the providers office for lab work.  Patient needs additional food assistance and transportation options.  Patient son does not work.  SW does suggest son apply for disability.  Patient is very close to the grocery store.  Patient is have issues with her car and is unable to ride the bus.   Goals Addressed             This Visit's Progress    Care Coordination Activities       Interventions Today    Flowsheet Row Most Recent Value  Chronic Disease   Chronic disease during today's visit Hypertension (HTN)  General Interventions   General Interventions Discussed/Reviewed General Interventions Discussed, General Interventions Reviewed, [employer] receives SSA/foodstamps and supports son.Pt uses [employer] for food.SW offered budgeting & pt has funds left for other expenses after bills are paid.SCAT application completed due to car issues.SW will provide food bank list.]              SDOH assessments and interventions completed:  No     Care Coordination Interventions:  Yes, provided   Follow up plan: Follow up call scheduled for 10/03/23 at 11am with Gus Puma.    Encounter Outcome:  Patient Visit Completed

## 2023-09-06 LAB — BASIC METABOLIC PANEL WITH GFR
BUN/Creatinine Ratio: 20 (ref 12–28)
BUN: 14 mg/dL (ref 8–27)
CO2: 24 mmol/L (ref 20–29)
Calcium: 9.2 mg/dL (ref 8.7–10.3)
Chloride: 101 mmol/L (ref 96–106)
Creatinine, Ser: 0.71 mg/dL (ref 0.57–1.00)
Glucose: 93 mg/dL (ref 70–99)
Potassium: 4.7 mmol/L (ref 3.5–5.2)
Sodium: 140 mmol/L (ref 134–144)
eGFR: 97 mL/min/{1.73_m2} (ref >59)

## 2023-09-11 ENCOUNTER — Other Ambulatory Visit: Payer: Self-pay

## 2023-09-11 ENCOUNTER — Encounter: Payer: Self-pay | Admitting: Allergy and Immunology

## 2023-09-11 ENCOUNTER — Ambulatory Visit (INDEPENDENT_AMBULATORY_CARE_PROVIDER_SITE_OTHER): Payer: Medicaid Other | Admitting: Allergy and Immunology

## 2023-09-11 VITALS — BP 124/74 | HR 69 | Temp 98.1°F | Resp 18 | Ht 63.0 in | Wt 317.6 lb

## 2023-09-11 DIAGNOSIS — J3089 Other allergic rhinitis: Secondary | ICD-10-CM

## 2023-09-11 DIAGNOSIS — J4550 Severe persistent asthma, uncomplicated: Secondary | ICD-10-CM

## 2023-09-11 DIAGNOSIS — K219 Gastro-esophageal reflux disease without esophagitis: Secondary | ICD-10-CM | POA: Diagnosis not present

## 2023-09-11 MED ORDER — FLUTICASONE PROPIONATE 50 MCG/ACT NA SUSP: 1.0000 | Freq: Every day | NASAL | 1 refills | Status: DC

## 2023-09-11 MED ORDER — ALBUTEROL SULFATE (2.5 MG/3ML) 0.083% IN NEBU: 2.5000 mg | INHALATION_SOLUTION | RESPIRATORY_TRACT | 1 refills | Status: AC | PRN

## 2023-09-11 MED ORDER — DULERA 200-5 MCG/ACT IN AERO: 2.0000 | INHALATION_SPRAY | Freq: Two times a day (BID) | RESPIRATORY_TRACT | 1 refills | Status: DC

## 2023-09-11 MED ORDER — LEVOCETIRIZINE DIHYDROCHLORIDE 5 MG PO TABS: 5.0000 mg | ORAL_TABLET | Freq: Every day | ORAL | 1 refills | Status: AC | PRN

## 2023-09-11 MED ORDER — ALBUTEROL SULFATE HFA 108 (90 BASE) MCG/ACT IN AERS: 2.0000 | INHALATION_SPRAY | RESPIRATORY_TRACT | 1 refills | Status: AC | PRN

## 2023-09-11 MED ORDER — MONTELUKAST SODIUM 10 MG PO TABS: 10.0000 mg | ORAL_TABLET | Freq: Every evening | ORAL | 1 refills | Status: DC

## 2023-09-11 NOTE — Patient Instructions (Signed)
    1. Continue to treat and prevent inflammation of airway:  A. Tezepelumab injections every 4 weeks B. Dulera 200 - 2 inhalations 1-2 times per day  C. Flonase - 1 spray each nostril 1-2 times per day  D. Montelukast 10 mg daily  2.  If needed:   A.  Albuterol HFA - 2 inhalations every 4-6 hours  B.  Antihistamine   3. Continue treatment for reflux as directed by GI  4. Return to clinic in 6 months or earlier if problem.   5. Influenza = Tamiflu. Covid = Paxlovid

## 2023-09-11 NOTE — Progress Notes (Unsigned)
 [place] - [place] - [place] - [place] - [place]   Follow-up Note  Referring Provider: [name], DO Primary Provider: [name], DO Date of Office Visit: 09/11/2023  Subjective:   Kelsey Bullock (DOB: 1961/02/22) is a 63 y.o. female who [facility] in re-evaluation of the following:  HPI: Kelsey Bullock returns to this clinic in evaluation of asthma, allergic rhinitis, LPR, history of diastolic dysfunction.  I last saw her in this clinic 08 May 2023.  She has really done well since her last visit regarding her airway without the need for systemic steroid or an antibiotic while she consistently uses anti-TSLP antibody every month and occasionally some Dulera and occasionally some Flonase and occasionally some montelukast.  Her requirement for short acting bronchodilators 1 or 2 times per month.  She has had no issues with her upper airway.  She believes that her reflux is under good control with therapy prescribed by gastroenterology.  Allergies as of 09/11/2023       Reactions   Amoxicillin Anaphylaxis, Swelling   Etodolac Anaphylaxis, Shortness Of Breath   lips swelled  lips swelled lips swelled,  lips swelled   Penicillins Anaphylaxis, Swelling, Rash   DID THE REACTION INVOLVE: Swelling of the face/tongue/throat, SOB, or low BP? Yes Sudden or severe rash/hives, skin peeling, or the inside of the mouth or nose? No Did it require medical treatment? No When did it last happen?      20 years ago If all above answers are "NO", may proceed with cephalosporin use.   Latex Rash, Hives   Pantoprazole Sodium Hives   unspecified REACTION: unspecified unspecified, REACTION: unspecified   Wound Dressing Adhesive Rash   Ampicillin Swelling   Augmentin [amoxicillin-pot Clavulanate] Swelling   Diclofenac Sodium    Unknown reaction    Pantoprazole Sodium    unspecified   Adhesive [tape] Rash        Medication  List    albuterol 108 (90 Base) MCG/ACT inhaler Commonly known as: VENTOLIN HFA Inhale 2 puffs into the lungs every 4 (four) hours as needed.   albuterol (2.5 MG/3ML) 0.083% nebulizer solution Commonly known as: PROVENTIL Take 3 mLs (2.5 mg total) by nebulization every 4 (four) hours as needed. USE 1 VIAL VIA NEBULIZER EVERY 4 TO 6 HOURS AS NEEDED FOR COUGH OR WHEEZING   atorvastatin 20 MG tablet Commonly known as: LIPITOR Take 1 tablet (20 mg total) by mouth daily.   cetirizine 10 MG tablet Commonly known as: ZYRTEC Take 1 tablet (10 mg total) by mouth 2 (two) times daily as needed for allergies (Can take an extra dose for breakthrough symptoms).   cyclobenzaprine 10 MG tablet Commonly known as: FLEXERIL Take 1 tablet (10 mg total) by mouth 3 (three) times daily as needed for muscle spasms.   DHS Zinc 2 % Sham Generic drug: Pyrithione Zinc Apply to scalp 3 to 5 times a week as ditreted   diclofenac Sodium 1 % Gel Commonly known as: VOLTAREN Apply 4 g topically 4 (four) times daily.   dicyclomine 10 MG capsule Commonly known as: BENTYL Take 10 mg by mouth every 6 (six) hours as needed.   Dulera 200-5 MCG/ACT Aero Generic drug: mometasone-formoterol Inhale 2 puffs into the lungs in the morning and at bedtime. Take twice daily for 1-2 weeks with respiratory illness or asthma flare.   EPINEPHrine 0.3 mg/0.3 mL Soaj injection Commonly known as: EpiPen 2-Pak Inject 0.3 mg into  the muscle as needed for anaphylaxis.   famotidine 20 MG tablet Commonly known as: PEPCID Take 1 tablet (20 mg total) by mouth in the morning.   Fish Oil 1200 MG Caps Take 1 capsule by mouth.   Fluarix 0.5 ML injection Generic drug: influenza vac split trivalent PF Inject 0.5 mLs into the muscle once.   fluticasone 50 MCG/ACT nasal spray Commonly known as: FLONASE Place 1 spray into both nostrils daily.   furosemide 20 MG tablet Commonly known as: LASIX TAKE 1 TABLET BY MOUTH EVERY DAY FOR  7 DAYS, THEN AS NEEDED FOR LEG SWELLING   ipratropium 0.06 % nasal spray Commonly known as: ATROVENT Place 2 sprays into both nostrils 4 (four) times daily. USE 2 SPRAYS INTO EACH NOSTRIL EVERY 6 HOURS AS NEEDED FOR TO DRY UP NOSE.   ketoconazole 2 % shampoo Commonly known as: NIZORAL Apply 1 Application topically 2 (two) times a week.   levocetirizine 5 MG tablet Commonly known as: XYZAL Take 1 tablet (5 mg total) by mouth daily as needed for allergies (Can take an extra dose during flare ups.).   linaclotide 72 MCG capsule Commonly known as: LINZESS Take 2 capsules (144 mcg total) by mouth daily before breakfast.   MG Plus Protein 133 MG Tabs Take 1 tablet by mouth daily.   montelukast 10 MG tablet Commonly known as: SINGULAIR Take 1 tablet (10 mg total) by mouth at bedtime. Take one tablet by mouth at bedtime   Nebulizer Mask Adult Misc 1 Device by Does not apply route as needed.   nystatin powder Generic drug: nystatin Apply topically 3 (three) times daily.   Olopatadine HCl 0.7 % Soln Apply 1 drop to eye daily.   omeprazole 40 MG capsule Commonly known as: PRILOSEC Take 1 capsule (40 mg total) by mouth daily.   Spacer/Aero-Holding [employer] Use as directed with inhaler.   Tezspire 210 MG/1. syringe Generic drug: tezepelumab-ekko Inject 210 mg into the skin every 28 (twenty-eight) days.   triamcinolone 0.025 % ointment Commonly known as: KENALOG Apply topically 2 (two) times daily.    Past Medical History:  Diagnosis Date   Allergic rhinitis    Anemia    Anxiety    Arthritis    knees and back   Asthma    Complication of anesthesia    Congestive heart failure (CHF) (HCC)    Depression    Gall stone    GERD (gastroesophageal reflux disease)    Headache    migraines, stress headaches   History of kidney stones    Hypertension    Hyperthyroidism    during pregnancy   Pneumonia    PONV (postoperative nausea and vomiting)    Recurrent  upper respiratory infection (URI)    Sleep apnea    can't use Cpap    Past Surgical History:  Procedure Laterality Date   ADENOIDECTOMY     APPLICATION OF WOUND VAC  03/18/2019   APPLICATION OF WOUND VAC N/A 03/18/2019   Procedure: Application Of Wound Vac;  Surgeon: Manus Rudd, MD;  Location: [facility] OR;  Service: General;  Laterality: N/A;   BIOPSY  05/27/2018   Procedure: BIOPSY;  Surgeon: Napoleon Form, MD;  Location: [facility];  Service: Endoscopy;;   CESAREAN SECTION     COLONOSCOPY WITH PROPOFOL N/A 05/27/2018   Procedure: COLONOSCOPY WITH PROPOFOL;  Surgeon: Napoleon Form, MD;  Location: [facility];  Service: Endoscopy;  Laterality: N/A;   CYST EXCISION  EXPLORATORY LAPAROTOMY  02/03/2019   INGUINAL HERNIA REPAIR N/A 02/03/2019   Procedure: EXPLORATORY LAPAROTOMY WITH SMALL BOWEL RESECTION AND PRIMARY CLOSURE OF VENTRAL HERNIA;  Surgeon: Manus Rudd, MD;  Location: [facility] OR;  Service: General;  Laterality: N/A;   INSERTION OF MESH N/A 11/25/2020   Procedure: INSERTION OF MESH;  Surgeon: Manus Rudd, MD;  Location: [facility] OR;  Service: General;  Laterality: N/A;   OVARIAN CYST REMOVAL     POLYPECTOMY  05/27/2018   Procedure: POLYPECTOMY;  Surgeon: Napoleon Form, MD;  Location: [facility];  Service: Endoscopy;;   removal of gallstones     TONSILLECTOMY     TYMPANOSTOMY TUBE PLACEMENT     VENTRAL HERNIA REPAIR N/A 11/25/2020   Procedure: RECURRENT VENTRAL HERNIA REPAIR WITH MESH;  Surgeon: Manus Rudd, MD;  Location: [facility] OR;  Service: General;  Laterality: N/A;   WOUND EXPLORATION  03/18/2019   abdominal   WOUND EXPLORATION N/A 03/18/2019   Procedure: ABDOMINAL WOUND EXPLORATION;  Surgeon: Manus Rudd, MD;  Location: [facility] OR;  Service: General;  Laterality: N/A;    Review of systems negative except as noted in HPI / PMHx or noted below:  Review of Systems  Constitutional: Negative.   HENT: Negative.    Eyes: Negative.   Respiratory: Negative.     Cardiovascular: Negative.   Gastrointestinal: Negative.   Genitourinary: Negative.   Musculoskeletal: Negative.   Skin: Negative.   Neurological: Negative.   Endo/Heme/Allergies: Negative.   Psychiatric/Behavioral: Negative.       Objective:   Vitals:   09/11/23 1100  BP: 124/74  Pulse: 69  Resp: 18  Temp: 98.1 F (36.7 C)  SpO2: 96%   Height: 5\' 3"  (160 cm)  Weight: (!) 317 lb 9.6 oz (144.1 kg)   Physical Exam Constitutional:      Appearance: She is not diaphoretic.  HENT:     Head: Normocephalic.     Right Ear: Tympanic membrane, ear canal and external ear normal.     Left Ear: Tympanic membrane, ear canal and external ear normal.     Nose: Nose normal. No mucosal edema or rhinorrhea.     Mouth/Throat:     Pharynx: Uvula midline. No oropharyngeal exudate.  Eyes:     Conjunctiva/sclera: Conjunctivae normal.  Neck:     Thyroid: No thyromegaly.     Trachea: Trachea normal. No tracheal tenderness or tracheal deviation.  Cardiovascular:     Rate and Rhythm: Normal rate and regular rhythm.     Heart sounds: Normal heart sounds, S1 normal and S2 normal. No murmur heard. Pulmonary:     Effort: No respiratory distress.     Breath sounds: Normal breath sounds. No stridor. No wheezing or rales.  Lymphadenopathy:     Head:     Right side of head: No tonsillar adenopathy.     Left side of head: No tonsillar adenopathy.     Cervical: No cervical adenopathy.  Skin:    Findings: No erythema or rash.     Nails: There is no clubbing.  Neurological:     Mental Status: She is alert.     Diagnostics: Spirometry was performed and demonstrated an FEV1 of 1.89 at 80 % of predicted.   Assessment and Plan:   1. Severe persistent asthma without complication   2. Other allergic rhinitis   3. LPRD (laryngopharyngeal reflux disease)    1. Continue to treat and prevent inflammation of airway:  A. Tezepelumab injections every 4 weeks B. Elwin Sleight  200 - 2 inhalations 1-2 times  per day  C. Flonase - 1 spray each nostril 1-2 times per day  D. Montelukast 10 mg daily  2.  If needed:   A.  Albuterol HFA - 2 inhalations every 4-6 hours  B.  Antihistamine   3. Continue treatment for reflux as directed by GI  4. Return to clinic in 6 months or earlier if problem.   5. Influenza = Tamiflu. Covid = Paxlovid   Darnella is doing very well while using her anti-TSLP antibody as her anti-inflammatory treatment for her airway disease.  Occasionally she will use some Dulera and Flonase and montelukast and this plan appears to be working quite well.  Her reflux also appears to be under very good control with therapy prescribed by gastroenterology.  Will keep her on the plan noted above and see her back in this clinic in 6 months or earlier if there is a problem.   Laurette Schimke, MD Allergy / Immunology  [facility]

## 2023-09-12 ENCOUNTER — Encounter: Payer: Self-pay | Admitting: Allergy and Immunology

## 2023-09-13 ENCOUNTER — Encounter: Payer: Self-pay | Admitting: Family Medicine

## 2023-09-13 ENCOUNTER — Ambulatory Visit (INDEPENDENT_AMBULATORY_CARE_PROVIDER_SITE_OTHER): Admitting: Family Medicine

## 2023-09-13 ENCOUNTER — Telehealth: Payer: Self-pay

## 2023-09-13 VITALS — BP 129/69 | HR 69 | Ht 63.0 in | Wt 317.8 lb

## 2023-09-13 DIAGNOSIS — I5030 Unspecified diastolic (congestive) heart failure: Secondary | ICD-10-CM

## 2023-09-13 DIAGNOSIS — R600 Localized edema: Secondary | ICD-10-CM | POA: Diagnosis not present

## 2023-09-13 DIAGNOSIS — L309 Dermatitis, unspecified: Secondary | ICD-10-CM | POA: Diagnosis not present

## 2023-09-13 DIAGNOSIS — R7303 Prediabetes: Secondary | ICD-10-CM

## 2023-09-13 NOTE — Patient Instructions (Signed)
 It was great to see you today! Thank you for choosing [facility] for your primary care.  Today we addressed: 1. Bilateral lower extremity edema I suspect this is either due to chronic heart failure or the veins in your legs getting a bit weaker.  You can consider wearing compression stockings if able and elevating your feet.  We can increase your Lasix after checking labs today.  2. Diastolic CHF with preserved left ventricular function, NYHA class 2 (HCC) We have ordered an echocardiogram of your heart to check on your heart function.  We will get that scheduled and let you know what time it will be.  It will be at [facility], enter through Entrance A and let them know what you are there for.  3. Dermatitis I am referring you to our dermatology clinic to discuss your seborrheic dermatitis, arm rash, and that spot near your pelvic region.  That will be schedule by our front desk!  We are checking some labs today, including blood counts, metabolic panel.  You will get a MyChart message or a letter if results are normal. Otherwise, you will get a call from [place].  If you had a referral placed, they will call you to set up an appointment. Please give [place] a call if you don't hear back in the next 2 weeks.  You should return to our clinic in 4 weeks to follow up.  Thank you for coming to see [place] at [facility] and for the opportunity to care for you! Kelsey Cheese, MD 09/13/2023, 11:54 AM

## 2023-09-13 NOTE — Progress Notes (Signed)
   SUBJECTIVE:   CHIEF COMPLAINT / HPI:  Kelsey Bullock is a 63 y.o. female with a pertinent past medical history of diastolic CHF (NYHA 2), HTN, OSA, severe persistent asthma, GERD, obesity, and fibromyalgia presenting to the clinic for worsening LE edema.  NYHA Class 2 diastolic CHF Diagnosed per echo 2018. Patient has noticed increasing swelling of legs for past few months. Was started on Lasix 20 mg daily by Dr. Claudean Severance on 09/05/2023. Elevates legs without much improvement. Has noticed some pins and needles sensation in BLE recently.  Dermatitis Differential: Contact dermatitis, atopic dermatitis, folliculitis, tinea. -Trial triamcinolone ointment - Consider KOH plan next visit if not improving  Dermatitis (multiple) Using selenium sulfide containing shampoo for seborrheic dermatitis as well as vitamin E topical solution and coconut oil. Has L arm rash noted on 4/2 and treated with course of triamcinolone and improvement. Notes inguinal welt that she suspects is due to chafing, first noticed in past week. Requests referral to Derm clinic. Does have multiple nevi on chest and back, requests evaluation for removal.  PERTINENT PMH / PSH: Diastolic CHF (NYHA 2), HTN, OSA, severe persistent asthma, GERD, obesity, and fibromyalgia Additionally OCD, PTSD, anxiety, and photosensitivity dermatitis   OBJECTIVE:   BP 129/69   Pulse 69   Ht 5\' 3"  (1.6 m)   Wt (!) 317 lb 12.8 oz (144.2 kg)   LMP 02/03/2019 Comment: Tubal Ligation  SpO2 96%   BMI 56.30 kg/m   General: Resting comfortably in chair, NAD, alert and at baseline. HEENT: Mild scalp flaking and seborrhea. Cardiovascular: No JVD.  Regular rate and rhythm. Normal S1/S2. No murmurs, rubs, or gallops appreciated. 2+ radial pulses. Pulmonary: Mild intermittent wheeze, but no crackles or rhonchi. Normal WOB on room air. No accessory muscle use. Abdominal: No tenderness to deep or light palpation. No rebound or guarding. No  HSM. Skin: 2 cm erythematous R inguinal papule with some skin excoriation but no ulceration (1st photo). L arm with few scattered erythematous nonblanching macules.  Multiple scattered nevi over chest and back, primarily <5 mm, symmetric, and uniform in color. Extremities: Symmetric 2+ pitting peripheral edema bilaterally. Capillary refill <2 seconds.  No erythema, rash, or ulceration of BLE including feet.  2+ DP and PT pulses bilaterally.   R inguinal region   L forearm   ASSESSMENT/PLAN:   Assessment & Plan Bilateral lower extremity edema Subacute onset symmetric LE edema without evidence of cellulitis.  Favor venous stasis with possibly contributing volume burden in setting of HFpEF.  No concern for DVT. - BMP and can increase Lasix dose if Na and K stable - Discussed compression stockings Diastolic CHF with preserved left ventricular function, NYHA class 2 (HCC) Last echo with preserved ejection fraction in 2018.  No evidence of elevated JVP, clear lungs on exam.  Weight stable from 4/2.  Not concerned for acute exacerbation, but would benefit from echo update. - Repeat TTE - BMP, CBC, TSH Prediabetes Last A1c 5.6, ordered per patient request.  Diet-controlled. - Hgb A1c Dermatitis L arm rash improved with triamcinolone compared to photos from 4/2.  Seborrheic dermatitis mild on exam.  L inguinal papule likely due to chafing.  No obvious melanotic skin lesions but with multiple nevi and requesting consideration for removal. - Referral to Dermatology clinic for L arm rash and other skin lesions  Return in about 4 weeks (around 10/11/2023) for Derm clinic as able.  Ceniya Fowers Sharion Dove, MD [facility]

## 2023-09-13 NOTE — Assessment & Plan Note (Signed)
 Last echo with preserved ejection fraction in 2018.  No evidence of elevated JVP, clear lungs on exam.  Weight stable from 4/2.  Not concerned for acute exacerbation, but would benefit from echo update. - Repeat TTE - BMP, CBC, TSH

## 2023-09-13 NOTE — Telephone Encounter (Signed)
 Patient calls nurse line in regards to LE edema.   She reports she was seen on 4/2 and was prescribed lasix. She reports compliance with taking, however she reports no improvement with swelling.   She reports her LE are very red and swollen. She reports LE are hot to touch. She denies any fevers or chills. She denies any weeping or draining. She denies any shortness of breath.   Patient scheduled for this morning for evaluation.

## 2023-09-14 ENCOUNTER — Telehealth: Payer: Self-pay | Admitting: Family Medicine

## 2023-09-14 DIAGNOSIS — R600 Localized edema: Secondary | ICD-10-CM

## 2023-09-14 DIAGNOSIS — E7800 Pure hypercholesterolemia, unspecified: Secondary | ICD-10-CM

## 2023-09-14 LAB — CBC
Hematocrit: 42.5 % (ref 34.0–46.6)
Hemoglobin: 14.1 g/dL (ref 11.1–15.9)
MCH: 30.1 pg (ref 26.6–33.0)
MCHC: 33.2 g/dL (ref 31.5–35.7)
MCV: 91 fL (ref 79–97)
Platelets: 222 10*3/uL (ref 150–450)
RBC: 4.68 x10E6/uL (ref 3.77–5.28)
RDW: 13.1 % (ref 11.7–15.4)
WBC: 6.2 10*3/uL (ref 3.4–10.8)

## 2023-09-14 LAB — BASIC METABOLIC PANEL WITH GFR
BUN/Creatinine Ratio: 18 (ref 12–28)
BUN: 11 mg/dL (ref 8–27)
CO2: 23 mmol/L (ref 20–29)
Calcium: 9.6 mg/dL (ref 8.7–10.3)
Chloride: 101 mmol/L (ref 96–106)
Creatinine, Ser: 0.62 mg/dL (ref 0.57–1.00)
Glucose: 91 mg/dL (ref 70–99)
Potassium: 4.8 mmol/L (ref 3.5–5.2)
Sodium: 139 mmol/L (ref 134–144)
eGFR: 101 mL/min/{1.73_m2} (ref >59)

## 2023-09-14 LAB — TSH RFX ON ABNORMAL TO FREE T4: TSH: 0.954 u[IU]/mL (ref 0.450–4.500)

## 2023-09-14 LAB — HEMOGLOBIN A1C
Est. average glucose Bld gHb Est-mCnc: 120 mg/dL
Hgb A1c MFr Bld: 5.8 % — ABNORMAL HIGH (ref 4.8–5.6)

## 2023-09-14 MED ORDER — ROSUVASTATIN CALCIUM 10 MG PO TABS: 10.0000 mg | ORAL_TABLET | Freq: Every day | ORAL | 3 refills | Status: DC

## 2023-09-14 MED ORDER — FUROSEMIDE 20 MG PO TABS: ORAL_TABLET | ORAL | 0 refills | Status: DC

## 2023-09-14 NOTE — Telephone Encounter (Signed)
 LE edema She reports some improvement in LE swelling today versus yesterday. - Sent prescription for Lasix 20 mg PRN daily + additional 20 mg ONLY if swelling is increased - She has some 20 mg pills right now and will continue with this regimen  Statin Patient also raises concern about atorvastatin 20 mg.  States she has actually not been taking this regularly (once a week at most) due to myalgias and being unable to walk due to pains. - Discontinued atorvastatin 20 mg, will try rosuvastatin 10 mg instead to see if patient can tolerate, she hesitantly agrees - Unable to calculate updated PREVENT score due to no recent lipids since 2023, but suspect patient has indication for high intensity statin given comorbidities and risk factors  Has close follow up with PCP Dr. Linwood Dibbles on 4/15, forwarding this statin conversation.

## 2023-09-17 ENCOUNTER — Ambulatory Visit

## 2023-09-17 DIAGNOSIS — J4550 Severe persistent asthma, uncomplicated: Secondary | ICD-10-CM

## 2023-09-18 ENCOUNTER — Ambulatory Visit: Admitting: Family Medicine

## 2023-09-18 ENCOUNTER — Encounter: Payer: Self-pay | Admitting: Family Medicine

## 2023-09-18 VITALS — BP 138/73 | HR 67 | Ht 63.0 in | Wt 318.2 lb

## 2023-09-18 DIAGNOSIS — G4730 Sleep apnea, unspecified: Secondary | ICD-10-CM

## 2023-09-18 DIAGNOSIS — L0292 Furuncle, unspecified: Secondary | ICD-10-CM | POA: Diagnosis not present

## 2023-09-18 DIAGNOSIS — I10 Essential (primary) hypertension: Secondary | ICD-10-CM

## 2023-09-18 DIAGNOSIS — J4550 Severe persistent asthma, uncomplicated: Secondary | ICD-10-CM

## 2023-09-18 DIAGNOSIS — E7800 Pure hypercholesterolemia, unspecified: Secondary | ICD-10-CM

## 2023-09-18 DIAGNOSIS — I5030 Unspecified diastolic (congestive) heart failure: Secondary | ICD-10-CM

## 2023-09-18 NOTE — Progress Notes (Unsigned)
   SUBJECTIVE:   CHIEF COMPLAINT / HPI:   Bilateral LE edema - Seen previously 4/10 for same.  At that time, thought secondary to venous stasis.  Not likely HFpEF exacerbation given clinical exam, echo ordered at that time, not yet done.  Recommended compression stockings and as needed Lasix on top of daily 20 mg. - feels it is better has not had to use the as needed Lasix - not using compression stockings. Has trouble getting them on.   Boil - in R groin. Spontaneously started draining last night.  Medication concern - Reported atorvastatin 20 mg was giving her myalgias and difficulty walking.  Was taking about once a week.  Per phone note 4/11, discontinued and switched to rosuvastatin 10 mg. - has not gotten yet, multiple family members with side effects to statins, very hesitant to start - h/o HFpEF, hypertension, obesity  Asthma, allergies - doing well. Got immunotherapy injection yesterday.  Social - previously with housing eviction concern, is currently still in same apartment.  Needs scat application filled out, awaiting application to be faxed to our office.  OSA - had sleep study years ago when >400lbs and recommended CPAP but with marked claustrophobia could not tolerate full mask.  Unable to wear nasal pillows due to being a mouth breather.  Per chart review, had decreased snoring with weight loss. Discussed repeat sleep study and sleep consultation however wants to wait until after ECHO due to transportation difficulties at this time.  OBJECTIVE:   BP 138/73   Pulse 67   Ht 5\' 3"  (1.6 m)   Wt (!) 318 lb 3.2 oz (144.3 kg)   LMP 02/03/2019 Comment: Tubal Ligation  SpO2 98%   BMI 56.37 kg/m   Gen: well appearing, in NAD Card: RRR Lungs: CTAB Ext: WWP, no pitting edema   ASSESSMENT/PLAN:   Hypertension Slightly elevated, improved on recheck. Only on daily lasix. Consider addition of ARB if needed in the future. Reviewed recent labs.   Diastolic CHF with preserved  left ventricular function, NYHA class 2 (HCC) Await updated echo. Euvolemic today. Recommend compliance with statin, plan for lipid panel, BMP at next visit.  Sleep apnea Discussed sleep study as above. Plan to revisit after echo.  Severe persistent asthma Lungs clear today. Continue to follow with [facility]  Boil Improving, actively draining on exam. Continue warm compresses, keep area clean and dry.     Kandis Ormond, DO

## 2023-09-18 NOTE — Patient Instructions (Addendum)
 It was great to see you!  Our plans for today:  - Continue your current daily lasix.  - I recommend trying the rosuvastatin. If you have side effects with this, we can always decrease your dose or discontinue.  - Once we get the SCAT application, we will fill this out. - Continue warm compresses on your boil to continue draining.  - Once we get the results of your heart ultrasound, we will let you know. - Let me know when you are ready to see a sleep doctor.  Take care and seek immediate care sooner if you develop any concerns.   Dr. Yasser Hepp

## 2023-09-19 DIAGNOSIS — L0292 Furuncle, unspecified: Secondary | ICD-10-CM | POA: Insufficient documentation

## 2023-09-19 NOTE — Assessment & Plan Note (Addendum)
 Await updated echo. Euvolemic today. Recommend compliance with statin, plan for lipid panel, BMP at next visit.

## 2023-09-19 NOTE — Assessment & Plan Note (Signed)
 Slightly elevated, improved on recheck. Only on daily lasix. Consider addition of ARB if needed in the future. Reviewed recent labs.

## 2023-09-19 NOTE — Assessment & Plan Note (Signed)
 Discussed sleep study as above. Plan to revisit after echo.

## 2023-09-19 NOTE — Assessment & Plan Note (Signed)
 Lungs clear today. Continue to follow with Allergy and Asthma

## 2023-09-19 NOTE — Assessment & Plan Note (Signed)
 Improving, actively draining on exam. Continue warm compresses, keep area clean and dry.

## 2023-09-26 NOTE — Telephone Encounter (Signed)
 Kelsey Bullock has been scheduled for 09/27/23 at 9:10 am with Dermatology.

## 2023-09-27 ENCOUNTER — Ambulatory Visit: Admitting: Student

## 2023-09-27 VITALS — BP 142/68 | HR 83 | Ht 63.0 in | Wt 316.4 lb

## 2023-09-27 DIAGNOSIS — L219 Seborrheic dermatitis, unspecified: Secondary | ICD-10-CM

## 2023-09-27 DIAGNOSIS — B079 Viral wart, unspecified: Secondary | ICD-10-CM

## 2023-09-27 DIAGNOSIS — L821 Other seborrheic keratosis: Secondary | ICD-10-CM

## 2023-09-27 MED ORDER — KETOCONAZOLE 2 % EX CREA: 1.0000 | TOPICAL_CREAM | Freq: Two times a day (BID) | CUTANEOUS | 0 refills | Status: AC

## 2023-09-27 NOTE — Patient Instructions (Addendum)
 It was great to see you! Thank you for allowing me to participate in your care!   Our plans for today:  - Ketoconazole  sent to pharmacy for your face, apply twice daily for two weeks and you can continue to use the ketoconazole  shampoo a couple times per week for your scalp - You can apply triamcinolone  ointment to the likely seborrheic keratosis below your breast if it itches - can use twice daily for up to 2 weeks. return to dermatology clinic for biopsy of this area - you can apply Vaseline to the wart we froze off today if desired. I recommend returning in about 4 weeks to see if this needs to be repeated along with the biopsy to confirm seborrheic keratotis   Take care and seek immediate care sooner if you develop any concerns.   Dr. Glenn Lange, DO [facility]

## 2023-09-27 NOTE — Assessment & Plan Note (Signed)
 Suspected seborrheic keratosis under breast, dark itchy stuck on appearing lesions - Schedule follow-up in four weeks for possible biopsy for confirmation

## 2023-09-27 NOTE — Progress Notes (Signed)
    SUBJECTIVE:   CHIEF COMPLAINT / HPI:   The patient, with a history of asthma and receiving allergy shots, presents with multiple skin issues. The primary concern is red splotches on their arm that have been appearing and disappearing over the past few months. They initially thought these might be related to their allergy shots, but a previous consultation with an allergist suggested they might be broken capillaries.  In addition to the red splotches, the patient has also noticed a skin colored lesion on her L cheek which itches a little, unsure how long it has been there. They also report flaky skin on their scalp, which they have been managing with ketoconazole  shampoo. Despite these treatments, they still experience itchiness and dryness which has extended around the eyes and mouth.   The patient also reports a darkened area under their breast that has been present for a year or two. They have tried various over-the-counter treatments with limited success. The area occasionally itches, and they use powder to keep it dry.    PERTINENT  PMH / PSH: Asthma, Seborrheic eczema  OBJECTIVE:   BP (!) 142/68   Pulse 83   Ht 5\' 3"  (1.6 m)   Wt (!) 316 lb 6.4 oz (143.5 kg)   LMP 02/03/2019 Comment: Tubal Ligation  SpO2 98%   BMI 56.05 kg/m    General: NAD, pleasant, able to participate in exam Cardiac: Well-perfused Respiratory: Normal effort on room air Skin: Slightly erythematous dry scaly skin around mouth, circular raised rough/crampy skin lesion on left cheek, slightly erythematous lesions on left forearm, several scattered stuck on appearing hyperpigmented plaques on below left breast, see photos Neuro: alert, no obvious focal deficits Psych: Normal affect and mood            ASSESSMENT/PLAN:   Procedure Cryotherapy Liquid nitrogen applied via Q-tip wart on left cheek, he will do for approximately 6 seconds for 4 cycles, patient tolerated well  Seborrheic  eczema Chronic seborrheic dermatitis with dryness, flakiness, and itchiness on scalp, face, and ears.  - Prescribed ketoconazole  cream for face twice daily for two weeks. - Continue ketoconazole  shampoo for scalp twice weekly. - Advised against triamcinolone  on face.   Seborrheic keratosis Suspected seborrheic keratosis under breast, dark itchy stuck on appearing lesions - Schedule follow-up in four weeks for possible biopsy for confirmation   Wart of face Wart-like growth, opted for cryotherapy. Informed about recurrence, risks, and potential ineffectiveness. - Perform cryotherapy. - Schedule follow-up in four weeks for potential repeat treatment.   Patient advised lesions on the R forearm appear to be vascular however not blanchable. Can continue to observe and discuss further at follow-up visit in 4 weeks  Dr. Glenn Lange, DO [facility]

## 2023-09-27 NOTE — Addendum Note (Signed)
 Addended by: Penni Bowman T on: 09/27/2023 01:57 PM   Modules accepted: Level of Service

## 2023-09-27 NOTE — Assessment & Plan Note (Signed)
 Wart-like growth, opted for cryotherapy. Informed about recurrence, risks, and potential ineffectiveness. - Perform cryotherapy. - Schedule follow-up in four weeks for potential repeat treatment.

## 2023-09-27 NOTE — Assessment & Plan Note (Signed)
 Chronic seborrheic dermatitis with dryness, flakiness, and itchiness on scalp, face, and ears.  - Prescribed ketoconazole  cream for face twice daily for two weeks. - Continue ketoconazole  shampoo for scalp twice weekly. - Advised against triamcinolone  on face.

## 2023-10-03 ENCOUNTER — Other Ambulatory Visit: Payer: Self-pay

## 2023-10-04 ENCOUNTER — Ambulatory Visit

## 2023-10-05 ENCOUNTER — Telehealth: Payer: Self-pay | Admitting: *Deleted

## 2023-10-05 NOTE — Progress Notes (Signed)
 Complex Care Management Care Guide Note  10/05/2023 Name: [name] MRN: [id number] DOB: December 09, 1960  Kelsey Bullock is a 63 y.o. year old female who is a primary care patient of Rumball, Alison M, DO and is actively engaged with the care management team. I reached out to Mickeal Aland by phone today to assist with re-scheduling  with the BSW.  Follow up plan: Unsuccessful telephone outreach attempt made. A HIPAA compliant phone message was left for the patient providing contact information and requesting a return call.  Barnie Bora  [facility]  [facility], [facility] Guide  Direct Dial: [phone]  Fax [phone]

## 2023-10-15 ENCOUNTER — Ambulatory Visit (INDEPENDENT_AMBULATORY_CARE_PROVIDER_SITE_OTHER)

## 2023-10-15 DIAGNOSIS — J4550 Severe persistent asthma, uncomplicated: Secondary | ICD-10-CM

## 2023-10-16 ENCOUNTER — Other Ambulatory Visit: Payer: Self-pay

## 2023-10-16 VITALS — Wt 320.0 lb

## 2023-10-16 DIAGNOSIS — Z79899 Other long term (current) drug therapy: Secondary | ICD-10-CM

## 2023-10-16 NOTE — Progress Notes (Signed)
 Complex Care Management Care Guide Note  10/16/2023 Name: Kelsey Bullock MRN: [id number] DOB: 10/16/1960  Kelsey Bullock is a 63 y.o. year old female who is a primary care patient of Rumball, Alison M, DO and is actively engaged with the care management team. I reached out to Mickeal Aland by phone today to assist with re-scheduling  with the BSW. Made patient aware a referral message was sent for Dr Koval to call her.   Follow up plan: Telephone appointment with complex care management team member scheduled for:  5/20  Barnie Bora  [facility]  [facility], [facility] Guide  Direct Dial: [phone]  Fax [phone]

## 2023-10-16 NOTE — Patient Instructions (Signed)
 Visit Information  [name] was given information about Medicaid Managed Care team care coordination services as a part of their [facility] Community [facility] Medicaid benefit. Kelsey Bullock verbally consented to engagement with the [facility] Managed Care team.   If you are experiencing a medical emergency, please call 911 or report to your local emergency department or urgent care.   If you have a non-emergency medical problem during routine business hours, please contact your provider's office and ask to speak with a nurse.   For questions related to your [facility], please call: [phone] or visit the homepage here: [email or web address]  If you would like to schedule transportation through your [facility], please call the following number at least 2 days in advance of your appointment: [phone]   Rides for urgent appointments can also be made after hours by calling Member Services.  Call the Behavioral Health Crisis Line at [phone], at any time, 24 hours a day, 7 days a week. If you are in danger or need immediate medical attention call 911.  If you would like help to quit smoking, call 1-800-QUIT-NOW ((682)738-2518) OR Espaol: 1-855-Djelo-Ya (1-324-401-0272) o para ms informacin haga clic aqu or Text READY to 536-644 to register via text  Kelsey Bullock - following are the goals we discussed in your visit today:   Goals Addressed             This Visit's Progress    VBCI RN Care Plan (long term)   On track    Problems:  Chronic Disease Management support and education needs related to CHF and fall risk Difficulty obtaining medications  Goal: Over the next 90 days the Patient will demonstrate a decrease CHF in exacerbations as evidenced by decreased use of PRN lasix  work with pharmacist to address Medication procurement related to difficulty  affording medications as evidenced by review of electronic medical record and patient or pharmacist report    Demonstrate a decrease in falls  Interventions:   Heart Failure Interventions: Assessed need for readable accurate scales in home Discussed importance of daily weight and advised patient to weigh and record daily Reviewed role of diuretics in prevention of fluid overload and management of heart failure; Discussed the importance of keeping all appointments with provider Advised patient to locate scale  Falls Interventions: Reviewed fall prevention strategies including using assistive devices, wearing appropriate footwear, and making sure walkways are free from clutter  Patient Self-Care Activities:  Attend all scheduled provider appointments Call provider office for new concerns or questions  Take medications as prescribed   Work with the social worker to address care coordination needs and will continue to work with the clinical team to address health care and disease management related needs Work with the pharmacist to address medication management needs and will continue to work with the clinical team to address health care and disease management related needs call office if I gain more than 2 pounds in one day or 5 pounds in one week watch for swelling in feet, ankles and legs every day weigh myself daily Locate scale  Plan:  Telephone follow up appointment with care management team member scheduled for:  11/13/23 at 10 AM          VBCI RN Care Plan (short term)   On track    Problems:  Care Coordination needs related to Financial Strain  Chronic Disease Management support and education needs related to CHF and fall risk Difficulty  obtaining medications Non-adherence to prescribed medication regimen  Goal: Over the next 30 days the Patient will attend all scheduled medical appointments: ECHO as evidenced by report uploaded to EMR        demonstrate Improved  adherence to prescribed treatment plan for CHF as evidenced by locating scale and checking weight daily work with pharmacist to address Medication procurement related to difficulty affording medications as evidenced by review of electronic medical record and patient or pharmacist report    work with Child psychotherapist to address financial constraints related to the management of health conditions as evidenced by review of electronic medical record and patient or social worker report      Interventions:   Heart Failure Interventions: Provided education on low sodium diet Assessed need for readable accurate scales in home Discussed importance of daily weight and advised patient to weigh and record daily Reviewed role of diuretics in prevention of fluid overload and management of heart failure; Discussed the importance of keeping all appointments with provider  Falls Interventions: Reviewed fall prevention strategies including using assistive devices, wearing appropriate footwear, and making sure walkways are free from clutter  Patient Self-Care Activities:  Attend all scheduled provider appointments Call provider office for new concerns or questions  Take medications as prescribed   Work with the social worker to address care coordination needs and will continue to work with the clinical team to address health care and disease management related needs Work with the pharmacist to address medication management needs and will continue to work with the clinical team to address health care and disease management related needs call office if I gain more than 2 pounds in one day or 5 pounds in one week watch for swelling in feet, ankles and legs every day weigh myself daily Locate your scale  Plan:  Telephone follow up appointment with care management team member scheduled for:  11/13/23 at 10 AM             Please see education materials related to Heart Failure Action Plan provided by  MyChart link.  Patient verbalizes understanding of instructions and care plan provided today and agrees to view in MyChart. Active MyChart status and patient understanding of how to access instructions and care plan via MyChart confirmed with patient.     The Patient                                              will call care guide* as advised to rescheduled missed appointment with BSW.  Telephone follow up appointment with Managed Medicaid care management team member scheduled for: 11/13/23 at 10 AM  Theodora Fish, RN MSN [employer]  [facility] Health RN Care Manager Direct Dial: [phone]  Fax: [phone]   Following is a copy of your plan of care:  There are no care plans that you recently modified to display for this patient.  Heart Failure Action Plan A heart failure action plan helps you know what to do when you have symptoms of heart failure. Your action plan is a color-coded plan that lists the symptoms to watch for and indicates what actions to take. If you have symptoms in the green zone, you're doing well. If you have symptoms in the yellow zone, you're having problems. If you have symptoms in the red zone, you need medical care right away. Follow the  plan that was created by you and your health care provider. Review your plan each time you visit your provider. Green zone These signs mean you're doing well and can continue what you're doing: You don't have new or worsening shortness of breath. You have very little swelling or no new swelling. Your weight is stable (no gain or loss). You have a normal activity level. You don't have chest pain or any other new symptoms. Yellow zone These signs and symptoms mean your condition may be getting worse and you should make some changes: You have trouble breathing when you're active. You have swelling in your feet or legs or have discomfort in your belly. You gain 2-3 lb (0.9-1.4 kg) in 24 hours, or 5 lb (2.3 kg) in  a week. This amount may be more or less depending on your condition. You get tired easily. You have trouble sleeping. You have a dry cough. If you have any of these symptoms: Contact your provider within the next day. Your provider may adjust your medicines. Red zone These signs and symptoms mean you should get medical help right away: You have trouble breathing when resting or cannot lie flat and you need to raise your head to help you breathe. You have a dry cough that's getting worse. You have swelling or pain in your feet or legs or discomfort in your belly that's getting worse. You suddenly gain more than 2-3 lb (0.9-1.4 kg) in 24 hours, or more than 5 lb (2.3 kg) in a week. This amount may be more or less depending on your condition. You have trouble staying awake or you feel confused. You don't have an appetite. You have worsening sadness or depression. These symptoms may be an emergency. Call 911 right away. Do not wait to see if the symptoms will go away. Do not drive yourself to the hospital. Follow these instructions at home: Take medicines only as told. Eat a heart-healthy diet. Work with a dietitian to create an eating plan that's best for you. Weigh yourself each day. Your target weight is __________ lb (__________ kg). Call your provider if you gain more than __________ lb (__________ kg) in 24 hours, or more than __________ lb (__________ kg) in a week. Health care provider name: _____________________________________________________ Health care provider phone number: _____________________________________________________ Where to find more information American Heart Association: heart.org This information is not intended to replace advice given to you by your health care provider. Make sure you discuss any questions you have with your health care provider. Document Revised: 01/04/2023 Document Reviewed: 01/04/2023 Elsevier Patient Education  2024 [employer].

## 2023-10-16 NOTE — Patient Outreach (Signed)
 Complex Care Management   Visit Note  10/16/2023  Name:  Kelsey Bullock MRN: [id number] DOB: 12/06/1960  Situation: Referral received for Complex Care Management related to Heart Failure, SDOH Barriers:  Financial Resource Strain, and medication noncompliance I obtained verbal consent from Patient.  Visit completed with Leotis Range  on the phone  Background:   Past Medical History:  Diagnosis Date   Allergic rhinitis    Anemia    Anxiety    Arthritis    knees and back   Asthma    Asthma 11/26/2006   Qualifier: Diagnosis of   By: Bronson Canny MD, Sammie Crigler         Complication of anesthesia    Congestive heart failure (CHF) (HCC)    Depression    Gall stone    GERD (gastroesophageal reflux disease)    Headache    migraines, stress headaches   History of kidney stones    Hypertension    Hyperthyroidism    during pregnancy   Pneumonia    PONV (postoperative nausea and vomiting)    Recurrent upper respiratory infection (URI)    Sleep apnea    can't use Cpap    Assessment: Patient Reported Symptoms:  Cognitive Cognitive Status: Able to follow simple commands, Alert and oriented to person, place, and time, Normal speech and language skills Cognitive/Intellectual Conditions Management [RPT]: None reported or documented in medical history or problem list   Health Maintenance Behaviors: Annual physical exam  Neurological Neurological Review of Symptoms: No symptoms reported    HEENT HEENT Symptoms Reported: No symptoms reported HEENT Conditions: Vision problem(s) Vision Problems: glaucoma HEENT Management Strategies: Routine screening Vision problem(s)  Cardiovascular Cardiovascular Symptoms Reported: Swelling in legs or feet (Swelling L>R. Pt has PRN Lasix , last dose a couple of days ago. She reports she does feel that the swelling went down with PRN medication.) Does patient have uncontrolled Hypertension?: Yes Is patient checking Blood Pressure at home?: No (Does not  have BP cuff) Cardiovascular Conditions: Hypertension, Heart failure, High blood cholesterol Cardiovascular Management Strategies: Weight management, Medication therapy Do You Have a Working Readable Scale?: No (Patient reports that she has a scale but does not know where it is. She does not like weighing herself regularly because she doesn't want to see the number.) Weight: (!) 320 lb (145.2 kg) (Patient reported) Cardiovascular Comment: Educated patient on the importance of daily weights and monitoring for fluid retention. Strongly encouraged her to locate scale and begin checking weight daily. Discussed when to take PRN lasix  and when to notify provider. Noted that patient has ECHO scheduled 10/26/23. Reminded patient of this appointment.  Respiratory Respiratory Symptoms Reported: Other: Other Respiratory Symptoms: Chronic shortness of breath at baseline per patient. She reports she has had to use her Albuterol  inhaler more lately due to pollen. Respiratory Conditions: Sleep disordered breathing, Asthma  Endocrine Patient reports the following symptoms related to hypoglycemia or hyperglycemia : No symptoms reported Is patient diabetic?: No    Gastrointestinal Gastrointestinal Symptoms Reported: Obesity (Last BM today) Gastrointestinal Conditions: Nausea, Reflux/heartburn Gastrointestinal Management Strategies: Medication therapy Nutrition Risk Screen (CP): No indicators present  Genitourinary Genitourinary Symptoms Reported: No symptoms reported    Integumentary Integumentary Symptoms Reported: Other Other Integumentary Symptoms: Patient has been seen in office several times for red splotches moreso on her L arm Skin Management Strategies: Medication therapy, Routine screening  Musculoskeletal Musculoskelatal Symptoms Reviewed: Unsteady gait Musculoskeletal Conditions: Osteoarthritis, Osteoporosis, Other Other Musculoskeletal Conditions: Fibromyalgia Musculoskeletal Management Strategies:  Medical device Falls  in the past year?: Yes Number of falls in past year: 2 or more Was there an injury with Fall?: No Fall Risk Category Calculator: 2 Patient Fall Risk Level: Moderate Fall Risk Patient at Risk for Falls Due to: History of fall(s), Impaired balance/gait, Impaired mobility Fall risk Follow up: Falls evaluation completed, Education provided, Falls prevention discussed  Psychosocial Psychosocial Symptoms Reported: Anxiety - if selected complete GAD Behavioral Health Conditions: Anxiety, Depression, Post traumatic stress Behavioral Management Strategies: Coping strategies Major Change/Loss/Stressor/Fears (CP): Medical condition, self, Resources Techniques to [place] with Loss/Stress/Change: Spiritual practice(s), Diversional activities Quality of Family Relationships: helpful, involved, supportive Do you feel physically threatened by others?: No      10/16/2023   11:50 AM  Depression screen PHQ 2/9  Decreased Interest 0  Down, Depressed, Hopeless 1  PHQ - 2 Score 1    There were no vitals filed for this visit.  Medications Reviewed Today     Reviewed by Valaria Garland, RN (Registered Nurse) on 10/16/23 at 1134  Med List Status: <None>   Medication Order Taking? Sig Documenting Provider Last Dose Status Informant  albuterol  (PROVENTIL ) (2.5 MG/3ML) 0.083% nebulizer solution [id number] Yes Take 3 mLs (2.5 mg total) by nebulization every 4 (four) hours as needed. USE 1 VIAL VIA NEBULIZER EVERY 4 TO 6 HOURS AS NEEDED FOR COUGH OR WHEEZING Kozlow, Rema Care, MD Taking Active   albuterol  (VENTOLIN  HFA) 108 (90 Base) MCG/ACT inhaler [id number] Yes Inhale 2 puffs into the lungs every 4 (four) hours as needed. Kozlow, Rema Care, MD Taking Active   cetirizine  (ZYRTEC ) 10 MG tablet [id number] Yes Take 1 tablet (10 mg total) by mouth 2 (two) times daily as needed for allergies (Can take an extra dose for breakthrough symptoms). Kozlow, Rema Care, MD Taking Active   cyclobenzaprine   (FLEXERIL ) 10 MG tablet [id number] Yes Take 1 tablet (10 mg total) by mouth 3 (three) times daily as needed for muscle spasms. Charise Companion, MD Taking Active   diclofenac  Sodium (VOLTAREN ) 1 % GEL [id number] Yes Apply 4 g topically 4 (four) times daily. Joelle Musca, MD Taking Active   dicyclomine (BENTYL) 10 MG capsule [id number] Yes Take 10 mg by mouth every 6 (six) hours as needed. [provider] Taking Active   EPINEPHrine  (EPIPEN  2-PAK) 0.3 mg/0.3 mL IJ SOAJ injection [id number] Yes Inject 0.3 mg into the muscle as needed for anaphylaxis. Kozlow, Rema Care, MD Taking Active   famotidine  (PEPCID ) 20 MG tablet [id number] Yes Take 1 tablet (20 mg total) by mouth in the morning. Kozlow, Rema Care, MD Taking Active            Med Note (Carman Essick P   Mon Sep 03, 2023 10:34 AM) Patient reports taking as needed.  FLUARIX 0.5 ML injection [id number]  Inject 0.5 mLs into the muscle once. [provider]  Active   fluticasone  (FLONASE ) 50 MCG/ACT nasal spray [id number] Yes Place 1 spray into both nostrils daily. Fabienne Holter, MD Taking Active            Med Note (Saqib Cazarez P   Tue Oct 16, 2023 11:31 AM) Patient reports taking as needed  furosemide  (LASIX ) 20 MG tablet [id number] Yes Take 20 mg daily as needed for leg swelling.  If leg swelling persists, can take additional 20 mg in the evening for total of 40 mg that day. Shitarev, Dimitry, MD Taking Active   ipratropium (ATROVENT ) 0.06 % nasal spray [id number] Yes Place 2 sprays into both  nostrils 4 (four) times daily. USE 2 SPRAYS INTO EACH NOSTRIL EVERY 6 HOURS AS NEEDED FOR TO DRY UP NOSE. Kozlow, Rema Care, MD Taking Active   ketoconazole  (NIZORAL ) 2 % cream [id number] Yes Apply 1 Application topically 2 (two) times daily. Use for two weeks Glenn Lange, DO Taking Active   ketoconazole  (NIZORAL ) 2 % shampoo [id number] Yes Apply 1 Application topically 2 (two) times a week. Charise Companion, MD Taking Active   levocetirizine (XYZAL ) 5 MG  tablet [id number] Yes Take 1 tablet (5 mg total) by mouth daily as needed for allergies (Can take an extra dose during flare ups.). Kozlow, Rema Care, MD Taking Active   linaclotide  (LINZESS ) 72 MCG capsule [id number] Yes Take 2 capsules (144 mcg total) by mouth daily before breakfast. Anibal Kent, MD Taking Active            Med Note (Shanece Cochrane P   Mon Sep 03, 2023 10:35 AM) Patient reports taking as needed.  mometasone -formoterol  (DULERA ) 200-5 MCG/ACT AERO [id number] Yes Inhale 2 puffs into the lungs in the morning and at bedtime. Take twice daily for 1-2 weeks with respiratory illness or asthma flare. Fabienne Holter, MD Taking Active            Med Note (Destyn Parfitt P   Tue Oct 16, 2023 11:29 AM) Patient reports taking once a day  montelukast  (SINGULAIR ) 10 MG tablet [id number] Yes Take 1 tablet (10 mg total) by mouth at bedtime. Take one tablet by mouth at bedtime Kozlow, Rema Care, MD Taking Active            Med Note (Ken Bonn P   Tue Oct 16, 2023 11:32 AM) Patient reports taking as needed  NYSTATIN  powder [id number] Yes Apply topically 3 (three) times daily. [provider] Taking Active   Olopatadine  HCl 0.7 % SOLN [id number] Yes Apply 1 drop to eye daily. [provider] Taking Active            Med Note (Jamauri Kruzel P   Tue Oct 16, 2023 11:32 AM) Patient reports taking as needed  Omega-3 Fatty Acids (FISH OIL) 1200 MG CAPS [id number] Yes Take 1 capsule by mouth. [provider] Taking Active            Med Note (Annelies Coyt P   Mon Sep 03, 2023 10:36 AM) Patient reports taking when she remembers.  omeprazole  (PRILOSEC) 40 MG capsule [id number] Yes Take 1 capsule (40 mg total) by mouth daily. Rumball, Alison M, DO Taking Active            Med Note (Terez Montee P   Mon Sep 03, 2023 10:34 AM) Patient reports taking as needed.  Pyrithione Zinc  (DHS ZINC ) 2 % SHAM [id number] Yes Apply to scalp 3 to 5 times a week as ditreted  Rumball, Alison M, DO Taking Active   Respiratory Therapy Supplies (NEBULIZER MASK ADULT) MISC [id number]  1 Device by Does not apply route as needed. Kozlow, Rema Care, MD  Active   rosuvastatin  (CRESTOR ) 10 MG tablet [id number] No Take 1 tablet (10 mg total) by mouth daily.  Patient not taking: Reported on 10/16/2023   Homer Lust, MD Not Taking Active   Spacer/Aero-Holding [facility] [id number]  Use as directed with inhaler. Kozlow, Eric J, MD  Active   Specialty Vitamins Products (MG PLUS PROTEIN) 133 MG TABS [id number] No Take 1 tablet by mouth daily.  Patient not taking: Reported on 10/16/2023   [provider] Not Taking Active   tezepelumab -ekko (TEZSPIRE ) 210 MG/1. syringe 210 mg [id number]   Kozlow, Eric J, MD  Active   TEZSPIRE  210 MG/1. syringe [id number] Yes Inject 210 mg into the skin every 28 (twenty-eight) days. [provider] Taking Active   triamcinolone  (KENALOG ) 0.025 % ointment [id number] Yes Apply topically 2 (two) times daily. Lavada Porteous, DO Taking Active             Recommendation:   Referral to: Pharmacist for assistance obtaining medications Locate your scale and begin checking daily  Follow Up Plan:   Telephone follow up appointment date/time:  11/13/23 at 10 AM  Theodora Fish, RN MSN [place]  [facility] RN Care Manager Direct Dial: [phone]  Fax: [phone]

## 2023-10-23 ENCOUNTER — Other Ambulatory Visit: Payer: Self-pay

## 2023-10-23 NOTE — Patient Outreach (Signed)
 Complex Care Management   Visit Note  10/23/2023  Name:  Kelsey Bullock MRN: [id number] DOB: 11-29-1960  Situation: Referral received for Complex Care Management related to SDOH Barriers:  Transportation Food insecurity I obtained verbal consent from Patient.  Visit completed with patient  on the phone  Background:   Past Medical History:  Diagnosis Date   Allergic rhinitis    Anemia    Anxiety    Arthritis    knees and back   Asthma    Asthma 11/26/2006   Qualifier: Diagnosis of   By: Kelsey Canny MD, Kelsey Bullock         Complication of anesthesia    Congestive heart failure (CHF) (HCC)    Depression    Gall stone    GERD (gastroesophageal reflux disease)    Headache    migraines, stress headaches   History of kidney stones    Hypertension    Hyperthyroidism    during pregnancy   Pneumonia    PONV (postoperative nausea and vomiting)    Recurrent upper respiratory infection (URI)    Sleep apnea    can't use Cpap    Assessment: SW completed a telephone outreach with patient, she does have a car and it sometimes works. Patient states she does not think she can afford the small fee for SCAT, but would still like the application. Patient states she does receive foodstamps and goes to the [employer]. SW and patient agreed for resources to be mailed.   SDOH Interventions    Flowsheet Row Patient Outreach Telephone from 10/16/2023 in [place] POPULATION HEALTH DEPARTMENT Patient Outreach Telephone from 07/05/2023 in [place] POPULATION HEALTH DEPARTMENT Patient Outreach Telephone from 07/02/2023 in [place] POPULATION HEALTH DEPARTMENT Patient Outreach Telephone from 06/04/2023 in [place] POPULATION HEALTH DEPARTMENT Patient Outreach Telephone from 05/02/2023 in [place] POPULATION HEALTH DEPARTMENT Patient Outreach Telephone from 04/02/2023 in [place] POPULATION HEALTH DEPARTMENT  SDOH Interventions        Food Insecurity Interventions Intervention  Not Indicated  [Gets food stamps] -- -- -- Other (Comment)  [has resources and sister helps] --  Housing Interventions Intervention Not Indicated -- -- -- Other (Comment)  [patient given resources-caught up on rent at this time] --  Transportation Interventions Payor Benefit  [Patient reports she previously tried to apply for SCAT but never heard anything back] -- Intervention Not Indicated -- -- --  Utilities Interventions Intervention Not Indicated  [Water  is currently included in her rent] Intervention Not Indicated Intervention Not Indicated -- -- --  Alcohol Usage Interventions -- -- -- Intervention Not Indicated (Score <7) -- --  Physical Activity Interventions -- -- -- -- -- Intervention Not Indicated  Stress Interventions -- Other (Comment)  [receives therapy services] -- -- -- --  Health Literacy Interventions -- -- -- Intervention Not Indicated -- --         Recommendation:   No recommendations at this time.  Follow Up Plan:   Telephone follow-up 11/12/23  Valora Gear, BSW, MHA [place]  [facility] Social Worker, Population Health [phone]

## 2023-10-23 NOTE — Patient Instructions (Signed)
 Visit Information  Kelsey Bullock was given information about Medicaid Managed Care team care coordination services as a part of their [facility] Plan Medicaid benefit. Kelsey Bullock verbally consented to engagement with the [facility] Managed Care team.   If you are experiencing a medical emergency, please call 911 or report to your local emergency department or urgent care.   If you have a non-emergency medical problem during routine business hours, please contact your provider's office and ask to speak with a nurse.   For questions related to your [facility], please call: [phone] or visit the homepage here: [email or web address]  If you would like to schedule transportation through your [facility], please call the following number at least 2 days in advance of your appointment: [phone]   Rides for urgent appointments can also be made after hours by calling Member Services.  Call the Behavioral Health Crisis Line at [phone], at any time, 24 hours a day, 7 days a week. If you are in danger or need immediate medical attention call 911.  If you would like help to quit smoking, call 1-800-QUIT-NOW (603-760-0936) OR Espaol: 1-855-Djelo-Ya (1-324-401-0272) o para ms informacin haga clic aqu or Text READY to 536-644 to register via text  Kelsey Bullock - following are the goals we discussed in your visit today:   Goals Addressed             This Visit's Progress    BSW [facility]BCI Social Work Care Plan       Problems:   Food Insecurity  and possible transportation  CSW Clinical Goal(s):   Over the next 30 days the Patient will will follow up with resources as directed by Social Work.  Interventions:  SW will send resources for food and SCAT application via mail  Patient Goals/Self-Care Activities:  Complete SCAT application and use food resources  sent.  Plan:   SW will follow up on 11/06/23 at 11:00am         Social Worker will follow up on 11/06/23.  Valora Gear, Florestine Hurl, MHA [place]  [facility] Social Worker, Population Health [phone]   Following is a copy of your plan of care:  There are no care plans that you recently modified to display for this patient.

## 2023-10-26 ENCOUNTER — Ambulatory Visit (HOSPITAL_COMMUNITY)
Admission: RE | Admit: 2023-10-26 | Discharge: 2023-10-26 | Disposition: A | Discharge status: Home / Self Care | Source: Ambulatory Visit | Attending: Family Medicine | Admitting: Family Medicine

## 2023-10-26 ENCOUNTER — Ambulatory Visit: Payer: Self-pay | Admitting: Family Medicine

## 2023-10-26 DIAGNOSIS — R0600 Dyspnea, unspecified: Secondary | ICD-10-CM | POA: Diagnosis not present

## 2023-10-26 DIAGNOSIS — G4730 Sleep apnea, unspecified: Secondary | ICD-10-CM | POA: Insufficient documentation

## 2023-10-26 DIAGNOSIS — R600 Localized edema: Secondary | ICD-10-CM | POA: Insufficient documentation

## 2023-10-26 DIAGNOSIS — I110 Hypertensive heart disease with heart failure: Secondary | ICD-10-CM | POA: Insufficient documentation

## 2023-10-26 DIAGNOSIS — I509 Heart failure, unspecified: Secondary | ICD-10-CM | POA: Diagnosis not present

## 2023-10-26 DIAGNOSIS — I5030 Unspecified diastolic (congestive) heart failure: Secondary | ICD-10-CM | POA: Diagnosis not present

## 2023-10-26 LAB — ECHOCARDIOGRAM COMPLETE
AR max vel: 2.12 cm2
AV Area VTI: 2.42 cm2
AV Area mean vel: 2.1 cm2
AV Mean grad: 4 mmHg
AV Peak grad: 8.1 mmHg
Ao pk vel: 1.42 m/s
Area-P 1/2: 4.17 cm2
Calc EF: 58.3 %
S' Lateral: 2.8 cm
Single Plane A2C EF: 59 %
Single Plane A4C EF: 57.9 %

## 2023-10-30 DIAGNOSIS — Z006 Encounter for examination for normal comparison and control in clinical research program: Secondary | ICD-10-CM

## 2023-10-30 NOTE — Research (Signed)
 SITE: [id number]     Subject # [id number]    Subprotocol: A  Inclusion Criteria  Patients who meet all of the following criteria are eligible for enrollment as study participants:  Yes No  Age > 62 years old X   Eligible to wear Holter Study X    Exclusion Criteria  Patients who meet any of these criteria are not eligible for enrollment as study participants: Yes No  1. Receiving any mechanical (respiratory or circulatory) or renal support therapy at Screening or during Visit #1.  X  2.  Any other conditions that in the opinion of the investigators are likely to prevent compliance with the study protocol or pose a safety concern if the subject participates in the study.  X  3. Poor tolerance, namely susceptible to severe skin allergies from ECG adhesive patch application.  X   Protocol: REV H    60 minute start window         Cor device must be applied, and the study initiated, no later than 60 minutes of completing the Echocardiogram                             HH:MM  Echo completion time  15:29  2.   Cor Study start time  15:42   30-Minute execution window  Once Cor Monitoring begins, 3 QT Med ECGs and the 15-minute rest period must be completed within a 30 minute window     HH:MM  3. QT Med ECG Completion time  15:47  4. Start of 15-Min sitting rest period  15:48  5. End of 15-Min rest period  16:05  6. Time of device removal  16:15   *Continue to use the Mobile App Event feature to log the Rest period windows and follow instructions on the EF-ACT Clinical Trial  Patient Instruction Card.  Describe any anomalies in Protocol execution in the Protocol Deviation Log    Residential Zip code [address] (First 3 digits ONLY)                                           [facility] Informed Consent   Subject Name: Kelsey Bullock  Subject met inclusion and exclusion criteria.  The informed consent form, study requirements and expectations were reviewed with the subject. Subject had opportunity  to read consent and questions and concerns were addressed prior to the signing of the consent form.  The subject verbalized understanding of the trial requirements.  The subject agreed to participate in the [facility] EF ACT trial and signed the informed consent at 15:37 on 26-Oct-2023.  The informed consent was obtained prior to performance of any protocol-specific procedures for the subject.  A copy of the signed informed consent was given to the subject and a copy was placed in the subject's medical record.   Kelsey Bullock          Current Outpatient Medications:    albuterol  (PROVENTIL ) (2.5 MG/3ML) 0.083% nebulizer solution, Take 3 mLs (2.5 mg total) by nebulization every 4 (four) hours as needed. USE 1 VIAL VIA NEBULIZER EVERY 4 TO 6 HOURS AS NEEDED FOR COUGH OR WHEEZING, Disp: 100 mL, Rfl: 1   albuterol  (VENTOLIN  HFA) 108 (90 Base) MCG/ACT inhaler, Inhale 2 puffs into the lungs every 4 (four) hours as needed., Disp: 18 g, Rfl:  1   cetirizine  (ZYRTEC ) 10 MG tablet, Take 1 tablet (10 mg total) by mouth 2 (two) times daily as needed for allergies (Can take an extra dose for breakthrough symptoms)., Disp: 60 tablet, Rfl: 5   cyclobenzaprine  (FLEXERIL ) 10 MG tablet, Take 1 tablet (10 mg total) by mouth 3 (three) times daily as needed for muscle spasms., Disp: 30 tablet, Rfl: 3   diclofenac  Sodium (VOLTAREN ) 1 % GEL, Apply 4 g topically 4 (four) times daily., Disp: 350 g, Rfl: 0   dicyclomine (BENTYL) 10 MG capsule, Take 10 mg by mouth every 6 (six) hours as needed., Disp: , Rfl:    EPINEPHrine  (EPIPEN  2-PAK) 0.3 mg/0.3 mL IJ SOAJ injection, Inject 0.3 mg into the muscle as needed for anaphylaxis., Disp: 1 each, Rfl: 2   famotidine  (PEPCID ) 20 MG tablet, Take 1 tablet (20 mg total) by mouth in the morning., Disp: 90 tablet, Rfl: 1   FLUARIX 0.5 ML injection, Inject 0.5 mLs into the muscle once., Disp: , Rfl:    fluticasone  (FLONASE ) 50 MCG/ACT nasal spray, Place 1 spray into both nostrils daily.,  Disp: 48 g, Rfl: 1   furosemide  (LASIX ) 20 MG tablet, Take 20 mg daily as needed for leg swelling.  If leg swelling persists, can take additional 20 mg in the evening for total of 40 mg that day., Disp: 90 tablet, Rfl: 0   ipratropium (ATROVENT ) 0.06 % nasal spray, Place 2 sprays into both nostrils 4 (four) times daily. USE 2 SPRAYS INTO EACH NOSTRIL EVERY 6 HOURS AS NEEDED FOR TO DRY UP NOSE., Disp: 15 mL, Rfl: 2   ketoconazole  (NIZORAL ) 2 % cream, Apply 1 Application topically 2 (two) times daily. Use for two weeks, Disp: 15 g, Rfl: 0   ketoconazole  (NIZORAL ) 2 % shampoo, Apply 1 Application topically 2 (two) times a week., Disp: 120 mL, Rfl: 6   levocetirizine (XYZAL ) 5 MG tablet, Take 1 tablet (5 mg total) by mouth daily as needed for allergies (Can take an extra dose during flare ups.)., Disp: 180 tablet, Rfl: 1   linaclotide  (LINZESS ) 72 MCG capsule, Take 2 capsules (144 mcg total) by mouth daily before breakfast., Disp: 30 capsule, Rfl: 6   mometasone -formoterol  (DULERA ) 200-5 MCG/ACT AERO, Inhale 2 puffs into the lungs in the morning and at bedtime. Take twice daily for 1-2 weeks with respiratory illness or asthma flare., Disp: 39 g, Rfl: 1   montelukast  (SINGULAIR ) 10 MG tablet, Take 1 tablet (10 mg total) by mouth at bedtime. Take one tablet by mouth at bedtime, Disp: 90 tablet, Rfl: 1   NYSTATIN  powder, Apply topically 3 (three) times daily., Disp: , Rfl:    Olopatadine  HCl 0.7 % SOLN, Apply 1 drop to eye daily., Disp: , Rfl:    Omega-3 Fatty Acids (FISH OIL) 1200 MG CAPS, Take 1 capsule by mouth., Disp: , Rfl:    omeprazole  (PRILOSEC) 40 MG capsule, Take 1 capsule (40 mg total) by mouth daily., Disp: 90 capsule, Rfl: 0   Pyrithione Zinc  (DHS ZINC ) 2 % SHAM, Apply to scalp 3 to 5 times a week as ditreted, Disp: 480 mL, Rfl: 3   Respiratory Therapy Supplies (NEBULIZER MASK ADULT) MISC, 1 Device by Does not apply route as needed., Disp: 1 each, Rfl: 1   rosuvastatin  (CRESTOR ) 10 MG tablet,  Take 1 tablet (10 mg total) by mouth daily. (Patient not taking: Reported on 10/16/2023), Disp: 90 tablet, Rfl: 3   Spacer/Aero-Holding [place] DEVI, Use as directed with inhaler., Disp:  1 each, Rfl: 0   Specialty Vitamins Products (MG PLUS PROTEIN) 133 MG TABS, Take 1 tablet by mouth daily. (Patient not taking: Reported on 10/16/2023), Disp: , Rfl:    TEZSPIRE  210 MG/1. syringe, Inject 210 mg into the skin every 28 (twenty-eight) days., Disp: , Rfl:    triamcinolone  (KENALOG ) 0.025 % ointment, Apply topically 2 (two) times daily., Disp: 454 g, Rfl: 5  Current Facility-Administered Medications:    tezepelumab -ekko (TEZSPIRE ) 210 MG/1. syringe 210 mg, 210 mg, Subcutaneous, Q28 days, Kozlow, Rema Care, MD, 210 mg at 10/15/23 [phone]

## 2023-11-01 ENCOUNTER — Telehealth: Payer: Self-pay | Admitting: Pharmacist

## 2023-11-01 ENCOUNTER — Ambulatory Visit

## 2023-11-01 NOTE — Telephone Encounter (Signed)
 Patient contacted for follow-up of missed appointment today and also need for medication supply / access assistance.    Patient reports on-going shortage of medications.  Reports multiple medications were filled "one time" at the end of 2024 for both her and her son.  Both have been taking medications PRN.  Not daily as prescribed for most of their medications.    Access appears to be limited by financial constraints.   Patient willing to schedule a visit for both her and her son.  Both were scheduled with PCP, Dr. Alverna John on 6/16 in the AM  Asked patient to bring "all medications" to visit for review.  I will try to be available to assist with review of current medication supply.   I will also ask Pharmacy Tech - [facility], CPhT to contact patient in attempt to review potential access for both tezpire and Dulera  Inhaler through [facility].   Total time with patient call and documentation of interaction: 22 minutes.

## 2023-11-02 ENCOUNTER — Other Ambulatory Visit (HOSPITAL_COMMUNITY): Payer: Self-pay

## 2023-11-02 NOTE — Telephone Encounter (Signed)
 Reviewed and agree with Dr Macky Lower plan.

## 2023-11-05 ENCOUNTER — Other Ambulatory Visit (HOSPITAL_COMMUNITY): Payer: Self-pay

## 2023-11-06 ENCOUNTER — Other Ambulatory Visit: Payer: Self-pay

## 2023-11-06 NOTE — Patient Instructions (Signed)
 Visit Information  Ms. Merida was given information about Medicaid Managed Care team care coordination services as a part of their [facility] Community Plan Medicaid benefit. ALYISSA WHIDBEE verbally consented to engagement with the [facility] Managed Care team.   If you are experiencing a medical emergency, please call 911 or report to your local emergency department or urgent care.   If you have a non-emergency medical problem during routine business hours, please contact your provider's office and ask to speak with a nurse.   For questions related to your [facility], please call: [phone] or visit the homepage here: [email or web address]  If you would like to schedule transportation through your [facility], please call the following number at least 2 days in advance of your appointment: [phone]   Rides for urgent appointments can also be made after hours by calling Member Services.  Call the Behavioral Health Crisis Line at [phone], at any time, 24 hours a day, 7 days a week. If you are in danger or need immediate medical attention call 911.  If you would like help to quit smoking, call 1-800-QUIT-NOW (231-352-5420) OR Espaol: 1-855-Djelo-Ya (5-643-329-5188) o para ms informacin haga clic aqu or Text READY to 416-606 to register via text  Ms. Denz - following are the goals we discussed in your visit today:   Goals Addressed             This Visit's Progress    COMPLETED: BSW VBCI Social Work Care Plan       Problems:   Food Insecurity  and possible transportation  CSW Clinical Goal(s):   Over the next 30 days the Patient will will follow up with resources as directed by Social Work.  Interventions:  SW will send resources for food and SCAT application via mail Follow up completed with patient. Resources receieved.  Patient  Goals/Self-Care Activities:  Complete SCAT application and use food resources sent.  Plan: Patient has been provided with contact information for SW for any needs that may come up.           The  Patient                                              has been provided with contact information for the Managed Medicaid care management team and has been advised to call with any health related questions or concerns.   Valora Gear, Florestine Hurl, MHA [place]  [facility] Social Worker, Population Health [phone]   Following is a copy of your plan of care:  There are no care plans that you recently modified to display for this patient.

## 2023-11-06 NOTE — Patient Outreach (Signed)
 Complex Care Management   Visit Note  11/06/2023  Name:  Kelsey Bullock MRN: [id number] DOB: 1961/03/27  Situation: Referral received for Complex Care Management related to SDOH Barriers:  Transportation Food insecurity I obtained verbal consent from Patient.  Visit completed with patient  on the phone  Background:   Past Medical History:  Diagnosis Date   Allergic rhinitis    Anemia    Anxiety    Arthritis    knees and back   Asthma    Asthma 11/26/2006   Qualifier: Diagnosis of   By: Bronson Canny MD, Sammie Crigler         Complication of anesthesia    Congestive heart failure (CHF) (HCC)    Depression    Gall stone    GERD (gastroesophageal reflux disease)    Headache    migraines, stress headaches   History of kidney stones    Hypertension    Hyperthyroidism    during pregnancy   Pneumonia    PONV (postoperative nausea and vomiting)    Recurrent upper respiratory infection (URI)    Sleep apnea    can't use Cpap    Assessment: SW completed a telephone outreach with patient, she did receive the resources SW sent for food and applying for SCAT. Patient states she is still having issues with her car. SW does not have any resources that will assist will car repairs. Patient will [facility] transportation for medical appointments.   SDOH Interventions    Flowsheet Row Patient Outreach Telephone from 10/16/2023 in [place] POPULATION HEALTH DEPARTMENT Patient Outreach Telephone from 07/05/2023 in [place] POPULATION HEALTH DEPARTMENT Patient Outreach Telephone from 07/02/2023 in [place] POPULATION HEALTH DEPARTMENT Patient Outreach Telephone from 06/04/2023 in [place] POPULATION HEALTH DEPARTMENT Patient Outreach Telephone from 05/02/2023 in [place] POPULATION HEALTH DEPARTMENT Patient Outreach Telephone from 04/02/2023 in [place] POPULATION HEALTH DEPARTMENT  SDOH Interventions        Food Insecurity Interventions Intervention Not Indicated  [Gets food stamps]  -- -- -- Other (Comment)  [has resources and sister helps] --  Housing Interventions Intervention Not Indicated -- -- -- Other (Comment)  [patient given resources-caught up on rent at this time] --  Transportation Interventions Payor Benefit  [Patient reports she previously tried to apply for SCAT but never heard anything back] -- Intervention Not Indicated -- -- --  Utilities Interventions Intervention Not Indicated  [Water  is currently included in her rent] Intervention Not Indicated Intervention Not Indicated -- -- --  Alcohol Usage Interventions -- -- -- Intervention Not Indicated (Score <7) -- --  Physical Activity Interventions -- -- -- -- -- Intervention Not Indicated  Stress Interventions -- Other (Comment)  [receives therapy services] -- -- -- --  Health Literacy Interventions -- -- -- Intervention Not Indicated -- --       Recommendation:   No recommendations at this time.  Follow Up Plan:   Patient has met all care management goals. Care Management case will be closed. Patient has been provided contact information should new needs arise.   Valora Gear, Florestine Hurl, MHA [place]  [facility] Social Worker, Population Health [phone]

## 2023-11-09 DIAGNOSIS — L719 Rosacea, unspecified: Secondary | ICD-10-CM | POA: Diagnosis not present

## 2023-11-09 DIAGNOSIS — H0102B Squamous blepharitis left eye, upper and lower eyelids: Secondary | ICD-10-CM | POA: Diagnosis not present

## 2023-11-09 DIAGNOSIS — H401131 Primary open-angle glaucoma, bilateral, mild stage: Secondary | ICD-10-CM | POA: Diagnosis not present

## 2023-11-09 DIAGNOSIS — H1045 Other chronic allergic conjunctivitis: Secondary | ICD-10-CM | POA: Diagnosis not present

## 2023-11-09 DIAGNOSIS — H04123 Dry eye syndrome of bilateral lacrimal glands: Secondary | ICD-10-CM | POA: Diagnosis not present

## 2023-11-09 DIAGNOSIS — H40033 Anatomical narrow angle, bilateral: Secondary | ICD-10-CM | POA: Diagnosis not present

## 2023-11-09 DIAGNOSIS — H47323 Drusen of optic disc, bilateral: Secondary | ICD-10-CM | POA: Diagnosis not present

## 2023-11-09 DIAGNOSIS — H0102A Squamous blepharitis right eye, upper and lower eyelids: Secondary | ICD-10-CM | POA: Diagnosis not present

## 2023-11-12 ENCOUNTER — Ambulatory Visit

## 2023-11-12 DIAGNOSIS — J4550 Severe persistent asthma, uncomplicated: Secondary | ICD-10-CM | POA: Diagnosis not present

## 2023-11-13 ENCOUNTER — Telehealth: Payer: Self-pay

## 2023-11-13 ENCOUNTER — Other Ambulatory Visit: Payer: Self-pay

## 2023-11-13 ENCOUNTER — Telehealth: Payer: Self-pay | Admitting: Family Medicine

## 2023-11-13 NOTE — Patient Instructions (Signed)
 Visit Information  Kelsey Bullock was given information about Medicaid Managed Care team care coordination services as a part of their [facility] Community Plan Medicaid benefit. Kelsey Bullock verbally consented to engagement with the [facility] Managed Care team.   If you are experiencing a medical emergency, please call 911 or report to your local emergency department or urgent care.   If you have a non-emergency medical problem during routine business hours, please contact your provider's office and ask to speak with a nurse.   For questions related to your [facility], please call: [phone] or visit the homepage here: [email or web address]  If you would like to schedule transportation through your [facility], please call the following number at least 2 days in advance of your appointment: [phone]   Rides for urgent appointments can also be made after hours by calling Member Services.  Call the Behavioral Health Crisis Line at [phone], at any time, 24 hours a day, 7 days a week. If you are in danger or need immediate medical attention call 911.  If you would like help to quit smoking, call 1-800-QUIT-NOW (434-187-9768) OR Espaol: 1-855-Djelo-Ya (1-324-401-0272) o para ms informacin haga clic aqu or Text READY to 536-644 to register via text  Kelsey Bullock - following are the goals we discussed in your visit today:   Goals Addressed             This Visit's Progress    VBCI RN Care Plan related to financial constraints   On track    Problems:  Care Coordination needs related to Financial Strain , Housing , and Transportation Difficulty obtaining medications Corporate treasurer.  Goal: Over the next 30 days the Patient will attend all scheduled medical appointments: PCP as evidenced by completed visit notes uploaded to EMR        work with pharmacist  to address Medication procurement related to difficulty affording medications as evidenced by review of electronic medical record and patient or pharmacist report    work with social worker to address Housing barriers and Transportation related to the management of chronic disease as evidenced by review of electronic medical record and patient or social worker report      Interventions:   Call placed to PCP office asking for assistance arranging transportation for PCP appointment 11/19/23. Reminded patient to bring all medications to this appointment for pharmacist to review per chart review.  Patient Self-Care Activities:  Attend all scheduled provider appointments Call provider office for new concerns or questions  Take medications as prescribed   Work with the social worker to address care coordination needs and will continue to work with the clinical team to address health care and disease management related needs Work with the pharmacist to address medication management needs and will continue to work with the clinical team to address health care and disease management related needs  Plan:  Telephone follow up appointment with care management team member scheduled for:  12/11/23 at 10 AM          VBCI RN Care Plan related to HTN and CHF   On track    Problems:  Chronic Disease Management support and education needs related to CHF and HTN  Goal: Over the next 30 days the Patient will attend all scheduled medical appointments: PCP as evidenced by completed visit notes uploaded to EMR        demonstrate a decrease CHF in exacerbations as evidenced by decreased use  of PRN lasix  demonstrate Improved adherence to prescribed treatment plan for CHF and HTN as evidenced by obtaining scale and BP cuff and checking daily  Interventions:   Heart Failure Interventions: Reviewed Heart Failure Action Plan in depth and provided written copy Assessed need for readable accurate scales in  home Discussed importance of daily weight and advised patient to weigh and record daily Reviewed role of diuretics in prevention of fluid overload and management of heart failure; Discussed the importance of keeping all appointments with provider Message sent to provider asking for order for a scale  Hypertension Interventions: Message sent to provider asking for order for a BP cuff  Patient Self-Care Activities:  Attend all scheduled provider appointments Call provider office for new concerns or questions  Take medications as prescribed   call office if I gain more than 2 pounds in one day or 5 pounds in one week watch for swelling in feet, ankles and legs every day weigh myself daily Obtain BP cuff and scale  Plan:  Telephone follow up appointment with care management team member scheduled for:  12/11/23 at 10 AM             Patient verbalizes understanding of instructions and care plan provided today and agrees to view in MyChart. Active MyChart status and patient understanding of how to access instructions and care plan via MyChart confirmed with patient.     Telephone follow up appointment with Managed Medicaid care management team member scheduled for: 12/11/23 at 10 AM  Theodora Fish, RN MSN [place]  [facility] RN Care Manager Direct Dial: [phone]  Fax: [phone]   Following is a copy of your plan of care:  There are no care plans that you recently modified to display for this patient.

## 2023-11-13 NOTE — Patient Instructions (Signed)
 Visit Information  Thank you for taking time to visit with me today. Please don't hesitate to contact me if I can be of assistance to you before our next scheduled appointment.  Your next care management appointment is by telephone on 11/14/23 at 1pm  Please call the care guide team at [phone] if you need to cancel, schedule, or reschedule an appointment.   Please call 911 if you are experiencing a Mental Health or Behavioral Health Crisis or need someone to talk to.  Dallis Dues, BSW [place]  [facility], [facility] Social Worker Direct Dial: [phone]  Fax: [phone] Website: [email or web address]

## 2023-11-13 NOTE — Patient Outreach (Signed)
 Complex Care Management   Visit Note  11/13/2023  Name:  Kelsey Bullock MRN: [id number] DOB: Sep 18, 1960  Situation: Referral received for Complex Care Management related to SDOH Barriers:  Housing Eviction I obtained verbal consent from Patient.  Visit completed with patient  on the phone  Background:   Past Medical History:  Diagnosis Date   Allergic rhinitis    Anemia    Anxiety    Arthritis    knees and back   Asthma    Asthma 11/26/2006   Qualifier: Diagnosis of   By: Bronson Canny MD, Sammie Crigler         Complication of anesthesia    Congestive heart failure (CHF) (HCC)    Depression    Gall stone    GERD (gastroesophageal reflux disease)    Headache    migraines, stress headaches   History of kidney stones    Hypertension    Hyperthyroidism    during pregnancy   Pneumonia    PONV (postoperative nausea and vomiting)    Recurrent upper respiratory infection (URI)    Sleep apnea    can't use Cpap    Assessment:  Patient reports that she was just informed that there was a misunderstanding with payments being deducted automatically.  Therefore, rent has not been paid over the past few months.  Patient reports she last paid in March. Leasing office is asking for additional information to review balance.  Rent is $184 but with other fees the total is now $1165.  Patient only has $400 left from her benefits.  Patient has no family or friends to assist.  Patient has been in contact with the leasing office but the manager was not available.  SW referred patient to provide leasing office with requested documentation and follow up with a call to confirm if court paperwork has been filed and itemized charges that total $1165 balance. SW suggested speaking to the leasing office to provide documentation that the crisis was caused by a billing error.  Patient is instructed to provide the letter to DSS and [facility] for emergency assistance.  If court paperwork has not been filed, patient is  suggested to set up payment arrangements.  SDOH Interventions    Flowsheet Row Patient Outreach Telephone from 11/13/2023 in [place] POPULATION HEALTH DEPARTMENT Patient Outreach Telephone from 10/16/2023 in [place] POPULATION HEALTH DEPARTMENT Patient Outreach Telephone from 07/05/2023 in [place] POPULATION HEALTH DEPARTMENT Patient Outreach Telephone from 07/02/2023 in [place] POPULATION HEALTH DEPARTMENT Patient Outreach Telephone from 06/04/2023 in [place] POPULATION HEALTH DEPARTMENT Patient Outreach Telephone from 05/02/2023 in [place] POPULATION HEALTH DEPARTMENT  SDOH Interventions        Food Insecurity Interventions -- Intervention Not Indicated  [Gets food stamps] -- -- -- Other (Comment)  [has resources and sister helps]  Housing Interventions Other (Comment)  [Message sent to BSW] Intervention Not Indicated -- -- -- Other (Comment)  [patient given resources-caught up on rent at this time]  Transportation Interventions -- Payor Benefit  [Patient reports she previously tried to apply for SCAT but never heard anything back] -- Intervention Not Indicated -- --  Utilities Interventions -- Intervention Not Indicated  [Water  is currently included in her rent] Intervention Not Indicated Intervention Not Indicated -- --  Alcohol Usage Interventions -- -- -- -- Intervention Not Indicated (Score <7) --  Stress Interventions -- -- Other (Comment)  [receives therapy services] -- -- --  Health Literacy Interventions -- -- -- -- Intervention Not Indicated --  Recommendation:   none  Follow Up Plan:   Telephone follow up appointment date/time:  11/14/23 at 1pm  Dallis Dues, BSW [place]  [facility], [facility] Social Worker Direct Dial: [phone]  Fax: [phone] Website: [email or web address]

## 2023-11-13 NOTE — Patient Instructions (Signed)

## 2023-11-13 NOTE — Telephone Encounter (Signed)
 Patient needs transportation to her appointment this coming Monday. Can we get her a bus pass or a taxi to her house to take her to and from appointment? Please reach out to patient with whatever decision is made.

## 2023-11-13 NOTE — Patient Outreach (Signed)
 Complex Care Management   Visit Note  11/13/2023  Name:  Kelsey Bullock MRN: [id number] DOB: 12-Dec-1960  Situation: Referral received for Complex Care Management related to Heart Failure, SDOH Barriers:  Transportation Housing  , and HTN I obtained verbal consent from Patient.  Visit completed with Leotis Range  on the phone  Background:   Past Medical History:  Diagnosis Date   Allergic rhinitis    Anemia    Anxiety    Arthritis    knees and back   Asthma    Asthma 11/26/2006   Qualifier: Diagnosis of   By: Bronson Canny MD, Sammie Crigler         Complication of anesthesia    Congestive heart failure (CHF) (HCC)    Depression    Gall stone    GERD (gastroesophageal reflux disease)    Headache    migraines, stress headaches   History of kidney stones    Hypertension    Hyperthyroidism    during pregnancy   Pneumonia    PONV (postoperative nausea and vomiting)    Recurrent upper respiratory infection (URI)    Sleep apnea    can't use Cpap    Assessment: Patient Reported Symptoms:  Cognitive Cognitive Status: Able to follow simple commands, Alert and oriented to person, place, and time, Normal speech and language skills Cognitive/Intellectual Conditions Management [RPT]: None reported or documented in medical history or problem list   Health Maintenance Behaviors: Annual physical exam  Neurological Neurological Review of Symptoms: Not assessed    HEENT HEENT Symptoms Reported: Not assessed      Cardiovascular Cardiovascular Symptoms Reported: Swelling in legs or feet (Last dose of PRN lasix  a few days ago. Patient reports her legs are still swollen but getting back to baseline. Swelling L>R) Does patient have uncontrolled Hypertension?: Yes Is patient checking Blood Pressure at home?: No (Does not have BP cuff) Cardiovascular Conditions: High blood cholesterol, Hypertension, Heart failure Cardiovascular Management Strategies: Medication therapy, Weight management Do You  Have a Working Readable Scale?: No (Patient has been unable to locate her scale) Cardiovascular Comment: Message sent to PCP asking for an order for BP cuff and scale.  Respiratory Respiratory Symptoms Reported: Other: Other Respiratory Symptoms: Chronic shortness of breath at [place] per patient. She reports she has had increased stress over the past couple of days "I feel like a thousand pounds is on my chest." Respiratory Conditions: Asthma, Sleep disordered breathing  Endocrine Patient reports the following symptoms related to hypoglycemia or hyperglycemia : Not assessed    Gastrointestinal Gastrointestinal Symptoms Reported: Not assessed      Genitourinary Genitourinary Symptoms Reported: Not assessed    Integumentary Integumentary Symptoms Reported: Not assessed    Musculoskeletal Musculoskelatal Symptoms Reviewed: Not assessed        Psychosocial Psychosocial Symptoms Reported: Not assessed            10/16/2023   11:50 AM  Depression screen PHQ 2/9  Decreased Interest 0  Down, Depressed, Hopeless 1  PHQ - 2 Score 1    There were no vitals filed for this visit.  Medications Reviewed Today   Medications were not reviewed in this encounter     Recommendation:   PCP Follow-up Specialty provider follow-up pharmacist DME requests:  other scale and BP cuff - message sent to PCP Continue Current Plan of Care Message sent to BSW regarding recent housing issues  Follow Up Plan:   Telephone follow up appointment date/time:  12/11/23 at 10 AM  Theodora Fish, RN MSN [place]  VBCI Population Health RN Care Manager Direct Dial: [phone]  Fax: [phone]

## 2023-11-14 ENCOUNTER — Telehealth: Payer: Self-pay

## 2023-11-14 NOTE — Patient Instructions (Signed)

## 2023-11-14 NOTE — Telephone Encounter (Signed)
 Called [place] [employer] to set up a taxi to pick patient up for her appointment 11/19/2023 - appointment time is at 9:50am with Dr. Rumball.  Filled taxi voucher out and sent it to the company [employer] via scanned email and placed voucher on [employer]  Attempted to call patient to inform her about the transportation arrangements that were made. LVM informing her the company and the time the taxi is set to come to her place of residence.  Christ Courier, CMA

## 2023-11-19 ENCOUNTER — Encounter: Payer: Self-pay | Admitting: Family Medicine

## 2023-11-19 ENCOUNTER — Other Ambulatory Visit (HOSPITAL_COMMUNITY): Payer: Self-pay

## 2023-11-19 ENCOUNTER — Telehealth: Payer: Self-pay

## 2023-11-19 ENCOUNTER — Ambulatory Visit (INDEPENDENT_AMBULATORY_CARE_PROVIDER_SITE_OTHER): Admitting: Family Medicine

## 2023-11-19 VITALS — BP 116/68 | HR 75 | Wt 321.8 lb

## 2023-11-19 DIAGNOSIS — Z599 Problem related to housing and economic circumstances, unspecified: Secondary | ICD-10-CM

## 2023-11-19 DIAGNOSIS — G629 Polyneuropathy, unspecified: Secondary | ICD-10-CM

## 2023-11-19 MED ORDER — GABAPENTIN 100 MG PO CAPS
100.0000 mg | ORAL_CAPSULE | Freq: Three times a day (TID) | ORAL | 0 refills | Status: DC | PRN
  Filled 2023-11-19: qty 90, 30d supply, fill #0

## 2023-11-19 NOTE — Patient Instructions (Signed)
 It was great to see you!  Our plans for today:  - We are referring you to Neurology. Let us  know if you don't hear about an appointment in the next few weeks.  - Try the gabapentin at night about an hour before you go to bed. If you tolerate ok, you can take up to 3 times daily.   Take care and seek immediate care sooner if you develop any concerns.   Dr. Emonnie Cannady

## 2023-11-19 NOTE — Telephone Encounter (Signed)
 Patient was seen today in clinic.  Patient stated that taxi that was set up to bring her to appointment never came.  Taxi was suppose to come get patient today 11/19/2023 at 8:30am for 9:50am appointment.   Christ Courier, CMA

## 2023-11-19 NOTE — Progress Notes (Unsigned)
   SUBJECTIVE:   CHIEF COMPLAINT / HPI:   Neuropathy - A1c 5.8 09/2023, Hb, electrolytes normal.  - described as sharp, electricity, sparking in lower legs with numbness in toes, knees lock up.  - constant - in hands, fingers also. Worse in feet. H/o carpal tunnel release, was told she would have to do it again. Doesn't want to do again. - sometimes with decreased grip strength.  - very hesitant to take gabapentin, sister takes and falls asleep easily.  - hasn't taken anything for pain. Has trouble affording medications.  - no alcohol use - some balance issues  Med rec - multiple out of date medications.  - has trouble affording medications and doesn't take medications regularly as prescribed due to this.   OBJECTIVE:   BP 116/68   Pulse 75   Wt (!) 321 lb 12.8 oz (146 kg)   LMP 02/03/2019 Comment: Tubal Ligation  SpO2 97%   BMI 57.00 kg/m   Gen: well appearing, in NAD Card: RRR Lungs: CTAB MSK: Tone and bulk normal. Full ROM, strength 5/5 to U/LE bilaterally, deep reflexes intact to LE, normal gait.  No edema.  Ext: WWP, no pitting edema  ASSESSMENT/PLAN:   Neuropathy Unknown etiology. MSK and neuro exam largely wnl. Labs obtained thus far unrevealing. Will refer to Neuro for further eval and possible EMG. Will trial low dose gabapentin.  Financial difficulties Difficulty affording meds. Has safe housing but also with transportation difficulties. Referred to VBCI last visit, in is contact. Discussed switching meds to [facility] today to set up account to pay as she is able to help with getting medications. Multiple duplicate medications and out of date medications in her bag today but did not want me to remove these for her. Recommended pill pack/box. Also discussed transportation through insurance.   F/u 4 weeks.  Greater than 40 minutes spent in direct face-to-face time with patient.  Kandis Ormond, DO

## 2023-11-20 ENCOUNTER — Encounter: Payer: Self-pay | Admitting: Family Medicine

## 2023-11-20 DIAGNOSIS — G629 Polyneuropathy, unspecified: Secondary | ICD-10-CM | POA: Insufficient documentation

## 2023-11-20 DIAGNOSIS — Z599 Problem related to housing and economic circumstances, unspecified: Secondary | ICD-10-CM | POA: Insufficient documentation

## 2023-11-20 NOTE — Assessment & Plan Note (Signed)
 Difficulty affording meds. Has safe housing but also with transportation difficulties. Referred to VBCI last visit, in is contact. Discussed switching meds to [facility] today to set up account to pay as she is able to help with getting medications. Multiple duplicate medications and out of date medications in her bag today but did not want me to remove these for her. Recommended pill pack/box. Also discussed transportation through insurance.

## 2023-11-20 NOTE — Assessment & Plan Note (Addendum)
 Unknown etiology. MSK and neuro exam largely wnl. Labs obtained thus far unrevealing. Will refer to Neuro for further eval and possible EMG. Will trial low dose gabapentin.

## 2023-11-21 ENCOUNTER — Other Ambulatory Visit (HOSPITAL_COMMUNITY): Payer: Self-pay

## 2023-12-06 ENCOUNTER — Telehealth: Payer: Self-pay | Admitting: Pharmacist

## 2023-12-06 NOTE — Telephone Encounter (Signed)
 Patient contacted for follow-up of medication access issues / lack of funds.  Since last contact 11/19/23 with Dr. Rumball and 11/01/2023 phone call from me patient reports she has NOT contacted [facility] pharmacy for assistance or to set-up a charge account.  She states that she sees no point in the future when she could pay for her medications and does not want to charge them.    Her income is Social Security - Disability.  She gets a single check for the month.  Her son lives with her and he has no income.  She has been unable to set him up with anyone in the social security office for financial assistance.  Her check is used to support both her and her son.   Patient DOES report taking Tezpire (biologic for her breathing) which he pays $4 and is willing to pay for that medication.   [place] [facility] Application is next option.  I asked patient to get help of her family members to help her apply to [place] [facility] for FREE Medications program.  I asked her to review medications list - once approved by [place] [facility] she should make appointment with our office (with Dr. Madelon OR with me) to then send all medications available through this site for her AND her son as new prescriptions through one office visit encounter.   She verbalized understanding of this plan.    I will review chart in 30 days to reevaluate he progress / Application/ Approval/ PCP visit scheduled or PCP visit completed and then review again for medication access status    Total time with patient call and documentation of interaction: 32 minutes.  F/U Phone call planned: 4 weeks.

## 2023-12-06 NOTE — Telephone Encounter (Signed)
 Reviewed and agree with Dr Macky Lower plan.

## 2023-12-10 ENCOUNTER — Ambulatory Visit

## 2023-12-10 DIAGNOSIS — J4550 Severe persistent asthma, uncomplicated: Secondary | ICD-10-CM

## 2023-12-11 ENCOUNTER — Other Ambulatory Visit: Payer: Self-pay | Admitting: Family Medicine

## 2023-12-11 ENCOUNTER — Other Ambulatory Visit: Payer: Self-pay

## 2023-12-11 MED ORDER — GNP DIGITAL WEIGHT SCALE MISC: 1.0000 | Freq: Once | 0 refills | Status: AC

## 2023-12-11 MED ORDER — BLOOD PRESSURE CUFF MISC: 1.0000 | Freq: Once | 0 refills | Status: AC

## 2023-12-11 NOTE — Patient Instructions (Signed)
 Visit Information  Kelsey Bullock was given information about Medicaid Managed Care team care coordination services as a part of their [facility] Community Plan Medicaid benefit. Kelsey Bullock verbally consented to engagement with the [facility] Managed Care team.   If you are experiencing a medical emergency, please call 911 or report to your local emergency department or urgent care.   If you have a non-emergency medical problem during routine business hours, please contact your provider's office and ask to speak with a nurse.   For questions related to your [facility], please call: [phone] or visit the homepage here: [email or web address]  If you would like to schedule transportation through your [facility], please call the following number at least 2 days in advance of your appointment: [phone]   Rides for urgent appointments can also be made after hours by calling Member Services.  Call the Behavioral Health Crisis Line at [phone], at any time, 24 hours a day, 7 days a week. If you are in danger or need immediate medical attention call 911.  If you would like help to quit smoking, call 1-800-QUIT-NOW (607-721-1037) OR Espaol: 1-855-Djelo-Ya (8-144-664-6430) o para ms informacin haga clic aqu or Text READY to 799-599 to register via text  Kelsey Bullock - following are the goals we discussed in your visit today:   Goals Addressed             This Visit's Progress    VBCI RN Care Plan   On track    Problems:  Chronic Disease Management support and education needs related to CHF and HTN  Goal: Over the next 30 days the Patient will demonstrate a decrease CHF in exacerbations as evidenced by decreased use of PRN lasix  demonstrate Improved adherence to prescribed treatment plan for CHF and HTN as evidenced by obtaining scale and BP cuff and checking  daily work with Child psychotherapist to address Housing barriers related to the management of chronic disease as evidenced by review of electronic medical record and patient or social worker report      Interventions:   Heart Failure Interventions: Assessed need for readable accurate scales in home Discussed importance of daily weight and advised patient to weigh and record daily Discussed the importance of keeping all appointments with provider Screening for signs and symptoms of depression related to chronic disease state  Message sent to provider asking for order for a scale to be resent  Hypertension Interventions: Message sent to provider asking for order for a BP cuff to be resent  Patient Self-Care Activities:  Attend all scheduled provider appointments Call provider office for new concerns or questions  Take medications as prescribed   call office if I gain more than 2 pounds in one day or 5 pounds in one week watch for swelling in feet, ankles and legs every day weigh myself daily Obtain BP cuff and scale  Plan:  Telephone follow up appointment with care management team member scheduled for:  01/08/24 at 10:30 AM             Patient verbalizes understanding of instructions and care plan provided today and agrees to view in MyChart. Active MyChart status and patient understanding of how to access instructions and care plan via MyChart confirmed with patient.     RN Care Manager will message PCP regarding order for scale and BP cuff, look into neurology referral Telephone follow up appointment with Managed Medicaid care management team member scheduled  for: 01/08/24 at 10:30 AM  [name], RN MSN [place]  [facility] Health RN Care Manager Direct Dial: [phone]  Fax: [phone]   Following is a copy of your plan of care:  There are no care plans that you recently modified to display for this patient.

## 2023-12-11 NOTE — Patient Outreach (Signed)
 Complex Care Management   Visit Note  12/11/2023  Name:  Kelsey Bullock MRN: [id number] DOB: [birth year]  Situation: Referral received for Complex Care Management related to Heart Failure and HTN I obtained verbal consent from Patient.  Visit completed with Arland Barber  on the phone  Background:   Past Medical History:  Diagnosis Date   Allergic rhinitis    Anemia    Anxiety    Arthritis    knees and back   Asthma    Asthma 11/26/2006   Qualifier: Diagnosis of   By: Scarlet MD, Elsie         Complication of anesthesia    Congestive heart failure (CHF) (HCC)    Depression    Gall stone    GERD (gastroesophageal reflux disease)    Headache    migraines, stress headaches   History of kidney stones    Hypertension    Hyperthyroidism    during pregnancy   Pneumonia    PONV (postoperative nausea and vomiting)    Recurrent upper respiratory infection (URI)    Sleep apnea    can't use Cpap    Assessment: Patient Reported Symptoms:  Cognitive Cognitive Status: Able to follow simple commands, Alert and oriented to person, place, and time, Normal speech and language skills Cognitive/Intellectual Conditions Management [RPT]: None reported or documented in medical history or problem list      Neurological Neurological Review of Symptoms: Other: Oher Neurological Symptoms/Conditions [RPT]: Neuropathy Neurological Comment: Patient notes she has not heard from neurology regarding referral placed 11/19/23. She is unsure where referral was sent, CMRN unable to tell where referral was sent. Will continue to monitor and follow-up to ensure appointment is scheduled.  HEENT HEENT Symptoms Reported: Not assessed      Cardiovascular Cardiovascular Symptoms Reported: Swelling in legs or feet (Patient reports swelling has continued) Does patient have uncontrolled Hypertension?: No Is patient checking Blood Pressure at home?: No (Patient has not received BP cuff) Cardiovascular  Management Strategies: Medication therapy, Weight management Do You Have a Working Readable Scale?: No (Patient has not received scale) Cardiovascular Comment: Order for BP cuff or scale not noted in chart review. Message sent to PCP asking to resend.  Respiratory Respiratory Symptoms Reported: Other: Other Respiratory Symptoms: Chronic shortness of breath at baseline per patient. She received Tezspire  injection yesterday. Respiratory Management Strategies: Adequate rest, Coping strategies  Endocrine Endocrine Symptoms Reported: Not assessed    Gastrointestinal Gastrointestinal Symptoms Reported: Not assessed      Genitourinary Genitourinary Symptoms Reported: Not assessed    Integumentary Integumentary Symptoms Reported: Not assessed    Musculoskeletal Musculoskelatal Symptoms Reviewed: Limited mobility, Unsteady gait Musculoskeletal Management Strategies: Medical device Falls in the past year?: Yes Number of falls in past year: 2 or more (No falls since previous CMRN visit) Was there an injury with Fall?: No Fall Risk Category Calculator: 2 Patient Fall Risk Level: Moderate Fall Risk Patient at Risk for Falls Due to: History of fall(s), Impaired balance/gait, Impaired mobility Fall risk Follow up: Falls evaluation completed, Education provided, Falls prevention discussed  Psychosocial Psychosocial Symptoms Reported: Other Other Psychosocial Conditions: Patient feels that depression and anxiety have remained stable. She continues to speak with psych regularly and uses her faith to cope. Behavioral Management Strategies: Coping strategies, Counseling Major Change/Loss/Stressor/Fears (CP): Medical condition, self, Resources Techniques to [place] with Loss/Stress/Change: Spiritual practice(s), Diversional activities        10/16/2023   11:50 AM  Depression screen PHQ 2/9  Decreased Interest  0  Down, Depressed, Hopeless 1  PHQ - 2 Score 1    There were no vitals filed for this  visit.  Medications Reviewed Today   Medications were not reviewed in this encounter     Recommendation:   DME requests:  other BP cuff, scale Continue Current Plan of Care Continue to work with pharmacist and social work regarding medication barriers and housing barriers  Follow Up Plan:   Telephone follow up appointment date/time:  01/08/24 at 10:30 AM  [name], RN MSN [place]  [facility] RN Care Manager Direct Dial: [phone]  Fax: [phone]

## 2024-01-07 ENCOUNTER — Ambulatory Visit

## 2024-01-07 DIAGNOSIS — J4550 Severe persistent asthma, uncomplicated: Secondary | ICD-10-CM | POA: Diagnosis not present

## 2024-01-08 ENCOUNTER — Other Ambulatory Visit: Payer: Self-pay

## 2024-01-08 NOTE — Patient Outreach (Signed)
 Care Coordination   01/08/2024 Name: Kelsey Bullock MRN: [id number] DOB: 05-27-1961   Care Coordination Outreach Attempts:  An unsuccessful outreach was attempted for an appointment today.  Follow Up Plan:  Additional outreach attempts will be made to complete CCM follow-up visit.   Encounter Outcome:  No Answer. HIPAA compliant voicemail left requesting return call.   [name], RN MSN [place]  [facility] Population Health RN Care Manager Direct Dial: [phone]  Fax: [phone]

## 2024-01-08 NOTE — Patient Instructions (Signed)
 Visit Information  [name] was given information about Medicaid Managed Care team care coordination services as a part of their [facility] Community Plan Medicaid benefit.   If you would like to schedule transportation through your [facility], please call the following number at least 2 days in advance of your appointment: [phone]   Rides for urgent appointments can also be made after hours by calling Member Services.  Call the Behavioral Health Crisis Line at [phone], at any time, 24 hours a day, 7 days a week. If you are in danger or need immediate medical attention call 911.   [name] - following are the goals we discussed in your visit today:   Goals Addressed             This Visit's Progress    VBCI RN Care Plan   On track    Problems:  Chronic Disease Management support and education needs related to CHF and HTN  Goal: Over the next 30 days the Patient will demonstrate a decrease CHF in exacerbations as evidenced by decreased use of PRN lasix  demonstrate Improved adherence to prescribed treatment plan for CHF and HTN as evidenced by checking BP and weight daily work with Child psychotherapist to address Housing barriers related to the management of chronic disease as evidenced by review of electronic medical record and patient or social worker report      Interventions:   Heart Failure Interventions: Assessed need for readable accurate scales in home Provided education about placing scale on hard, flat surface Advised patient to weigh each morning after emptying bladder Discussed importance of daily weight and advised patient to weigh and record daily Discussed the importance of keeping all appointments with provider Screening for signs and symptoms of depression related to chronic disease state   Hypertension Interventions: Advised patient to check BP daily and keep a log of readings  Patient Self-Care Activities:  Attend all  scheduled provider appointments Call provider office for new concerns or questions  Take medications as prescribed   call office if I gain more than 2 pounds in one day or 5 pounds in one week watch for swelling in feet, ankles and legs every day weigh myself daily Start checking BP and weight daily Patient will call neurology to schedule appointment  Plan:  Telephone follow up appointment with care management team member scheduled for:  02/05/24 at 11 AM          VBCI RN Care Plan related to financial constraints   On track    Problems:  Care Coordination needs related to Financial Strain , Housing , and Transportation Difficulty obtaining medications Corporate treasurer.  Goal: Over the next 30 days the Patient will work with pharmacist to address Medication procurement related to difficulty affording medications as evidenced by review of electronic medical record and patient or pharmacist report    work with social worker to address Housing barriers and Transportation related to the management of chronic disease as evidenced by review of electronic medical record and patient or social worker report      Interventions:   Provided patient with information regarding [place] [facility] as advised by pharmacist  Patient Self-Care Activities:  Attend all scheduled provider appointments Call provider office for new concerns or questions  Take medications as prescribed   Work with the social worker to address care coordination needs and will continue to work with the clinical team to address health care and disease management related needs  Work with the pharmacist to address medication management needs and will continue to work with the clinical team to address health care and disease management related needs  Plan:  Telephone follow up appointment with care management team member scheduled for:  02/05/24 at 11 AM             Please see education materials related to how to take  your blood pressure provided by MyChart link.  Patient verbalizes understanding of instructions and care plan provided today and agrees to view in MyChart. Active MyChart status and patient understanding of how to access instructions and care plan via MyChart confirmed with patient.     Telephone follow up appointment with Managed Medicaid care management team member scheduled for: 02/05/24 at 11 AM  [name], RN MSN [place]  [facility] Health RN Care Manager Direct Dial: [phone]  Fax: [phone]   Following is a copy of your plan of care:  There are no care plans that you recently modified to display for this patient.   How to Take Your Blood Pressure Blood pressure measures how strongly your blood is pressing against the walls of your arteries. Arteries are blood vessels that carry blood from your heart throughout your body. You can take your blood pressure at home with a machine. You may need to check your blood pressure at home: To check if you have high blood pressure (hypertension). To check your blood pressure over time. To make sure your blood pressure medicine is working. Supplies needed: Blood pressure machine, or monitor. A chair to sit in. This should be a chair where you can sit upright with your back supported. Do not sit on a soft couch or an armchair. Table or desk. Small notebook. Pencil or pen. How to prepare Avoid these things for 30 minutes before checking your blood pressure: Having drinks with caffeine in them, such as coffee or tea. Drinking alcohol. Eating. Smoking. Exercising. Do these things five minutes before checking your blood pressure: Go to the bathroom and pee (urinate). Sit in a chair. Be quiet. Do not talk. How to take your blood pressure Follow the instructions that came with your machine. If you have a digital blood pressure monitor, these may be the instructions: Sit up straight. Place your feet on the floor. Do  not cross your ankles or legs. Rest your left arm at the level of your heart. You may rest it on a table, desk, or chair. Pull up your shirt sleeve. Wrap the blood pressure cuff around the upper part of your left arm. The cuff should be 1 inch (2.5 cm) above your elbow. It is best to wrap the cuff around bare skin. Fit the cuff snugly around your arm, but not too tightly. You should be able to place only one finger between the cuff and your arm. Place the cord so that it rests in the bend of your elbow. Press the power button. Sit quietly while the cuff fills with air and loses air. Write down the numbers on the screen. Wait 2-3 minutes and then repeat steps 1-10. What do the numbers mean? Two numbers make up your blood pressure. The first number is called systolic pressure. The second is called diastolic pressure. An example of a blood pressure reading is 120 over 80 (or 120/80). If you are an adult and do not have a medical condition, use this guide to find out if your blood pressure is normal: Normal First number: below 120. Second  number: below 80. Elevated First number: 120-129. Second number: below 80. Hypertension stage 1 First number: 130-139. Second number: 80-89. Hypertension stage 2 First number: 140 or above. Second number: 90 or above. Your blood pressure is above normal even if only the first or only the second number is above normal. Follow these instructions at home: Medicines Take over-the-counter and prescription medicines only as told by your doctor. Tell your doctor if your medicine is causing side effects. General instructions Check your blood pressure as often as your doctor tells you to. Check your blood pressure at the same time every day. Take your monitor to your next doctor's appointment. Your doctor will: Make sure you are using it correctly. Make sure it is working right. Understand what your blood pressure numbers should be. Keep all follow-up  visits. General tips You will need a blood pressure machine or monitor. Your doctor can suggest a monitor. You can buy one at a drugstore or online. When choosing one: Choose one with an arm cuff. Choose one that wraps around your upper arm. Only one finger should fit between your arm and the cuff. Do not choose one that measures your blood pressure from your wrist or finger. Where to find more information American Heart Association: www.heart.org Contact a doctor if: Your blood pressure keeps being high. Your blood pressure is suddenly low. Get help right away if: Your first blood pressure number is higher than 180. Your second blood pressure number is higher than 120. These symptoms may be an emergency. Do not wait to see if the symptoms will go away. Get help right away. Call 911. Summary Check your blood pressure at the same time every day. Avoid caffeine, alcohol, smoking, and exercise for 30 minutes before checking your blood pressure. Make sure you understand what your blood pressure numbers should be. This information is not intended to replace advice given to you by your health care provider. Make sure you discuss any questions you have with your health care provider. Document Revised: 02/03/2021 Document Reviewed: 02/03/2021 Elsevier Patient Education  2024 [employer].

## 2024-01-08 NOTE — Patient Outreach (Signed)
 Complex Care Management   Visit Note  01/08/2024  Name:  Kelsey Bullock MRN: [id number] DOB: [birth year]  Situation: Referral received for Complex Care Management related to Heart Failure, SDOH Barriers:  Financial Resource Strain, and HTN I obtained verbal consent from Patient.  Visit completed with Arland Barber  on the phone  Background:   Past Medical History:  Diagnosis Date   Allergic rhinitis    Anemia    Anxiety    Arthritis    knees and back   Asthma    Asthma 11/26/2006   Qualifier: Diagnosis of   By: Scarlet MD, Elsie         Complication of anesthesia    Congestive heart failure (CHF) (HCC)    Depression    Gall stone    GERD (gastroesophageal reflux disease)    Headache    migraines, stress headaches   History of kidney stones    Hypertension    Hyperthyroidism    during pregnancy   Pneumonia    PONV (postoperative nausea and vomiting)    Recurrent upper respiratory infection (URI)    Sleep apnea    can't use Cpap    Assessment: Patient Reported Symptoms:  Cognitive Cognitive Status: Able to follow simple commands, Alert and oriented to person, place, and time, Normal speech and language skills Cognitive/Intellectual Conditions Management [RPT]: None reported or documented in medical history or problem list      Neurological Neurological Review of Symptoms: Other: [place] Neurological Symptoms/Conditions [RPT]: Neuropathy Neurological Comment: Patient reports she still has not heard from neurology regarding referral placed 11/19/23. It appears per chart review that referral was sent to [facility]. Provided patient with contact information to schedule an appointment.  HEENT HEENT Symptoms Reported: Not assessed      Cardiovascular Cardiovascular Symptoms Reported: Swelling in legs or feet (continued swelling BLE but not as bad) Does patient have uncontrolled Hypertension?: No Is patient checking Blood Pressure at home?: No Cardiovascular  Management Strategies: Medical device, Medication therapy, Weight management Do You Have a Working Readable Scale?: Yes Cardiovascular Comment: Confirmed patient has received scale and BP cuff. She has not started using either due to being focused on other things (financial strain). Encouraged patient to begin taking BP and weight daily.  Respiratory Respiratory Symptoms Reported: Other: Other Respiratory Symptoms: Chronic shortness of breath at baseline per patient. Additional Respiratory Details: Patient reports increased compliance with inhalers Respiratory Management Strategies: Adequate rest, Coping strategies  Endocrine Endocrine Symptoms Reported: Not assessed    Gastrointestinal Gastrointestinal Symptoms Reported: Not assessed      Genitourinary Genitourinary Symptoms Reported: Not assessed    Integumentary Integumentary Symptoms Reported: Not assessed    Musculoskeletal Musculoskelatal Symptoms Reviewed: Other, Limited mobility, Unsteady gait Other Musculoskeletal Symptoms: Generealized pain. I just try to overlook it. Musculoskeletal Management Strategies: Medical device Falls in the past year?: Yes Number of falls in past year: 2 or more (No falls since previous CMRN visit) Was there an injury with Fall?: No Fall Risk Category Calculator: 2 Patient Fall Risk Level: Moderate Fall Risk Patient at Risk for Falls Due to: History of fall(s), Impaired balance/gait, Impaired mobility Fall risk Follow up: Falls evaluation completed, Education provided, Falls prevention discussed  Psychosocial Psychosocial Symptoms Reported: Other Other Psychosocial Conditions: Patient continues to visit with pysch regularly. She reports she uses coping mechanisms to deal with stress related to financial strain. Behavioral Management Strategies: Coping strategies, Counseling Major Change/Loss/Stressor/Fears (CP): Medical condition, self, Resources Techniques to [place] with Loss/Stress/Change:  Spiritual practice(s), Diversional activities        10/16/2023   11:50 AM  Depression screen PHQ 2/9  Decreased Interest 0  Down, Depressed, Hopeless 1  PHQ - 2 Score 1    There were no vitals filed for this visit.  Medications Reviewed Today   Medications were not reviewed in this encounter     Recommendation:   Continue Current Plan of Care  Follow Up Plan:   Telephone follow up appointment date/time:  02/05/24 at 11 AM  [name], RN MSN [place]  [facility] RN Care Manager Direct Dial: [phone]  Fax: [phone]

## 2024-01-10 ENCOUNTER — Encounter: Payer: Self-pay | Admitting: Neurology

## 2024-01-22 ENCOUNTER — Ambulatory Visit (INDEPENDENT_AMBULATORY_CARE_PROVIDER_SITE_OTHER): Admitting: Family Medicine

## 2024-01-22 ENCOUNTER — Telehealth: Payer: Self-pay

## 2024-01-22 VITALS — BP 158/92 | HR 75 | Wt 326.0 lb

## 2024-01-22 DIAGNOSIS — L0292 Furuncle, unspecified: Secondary | ICD-10-CM | POA: Diagnosis present

## 2024-01-22 DIAGNOSIS — I10 Essential (primary) hypertension: Secondary | ICD-10-CM

## 2024-01-22 MED ORDER — DOXYCYCLINE HYCLATE 100 MG PO TABS: 100.0000 mg | ORAL_TABLET | Freq: Two times a day (BID) | ORAL | 0 refills | Status: AC

## 2024-01-22 MED ORDER — CLINDAMYCIN PHOS (ONCE-DAILY) 1 % EX GEL: 1.0000 | Freq: Two times a day (BID) | CUTANEOUS | 0 refills | Status: AC | PRN

## 2024-01-22 NOTE — Telephone Encounter (Signed)
 Patient calls nurse line requesting same day appointment for abscess on left breast. She reports that this has been present for about 1-2 weeks, however, pain has worsened over the last several days.   Patient reports that area is draining a brown/mucus color discharge. She is unsure of odor due to decreased sense of smell from past COVID.   She denies current fever.   Scheduled patient same day appointment this AM at 11:10.  Chiquita JAYSON English, RN

## 2024-01-22 NOTE — Assessment & Plan Note (Signed)
 Uncontrolled likely in the setting of pain. She is reassuringly comfortable on exam. Follow up after treatment with PCP.

## 2024-01-22 NOTE — Progress Notes (Signed)
    SUBJECTIVE:   CHIEF COMPLAINT / HPI:   Draining L breast abscess Been there for about 2 weeks. Has another area that is coming up on the R breast. The areas are painful and drain. She has been able to pop them in the past. She has had these for multiple years. No smoking. Never had these treated. No fevers.  OBJECTIVE:   BP (!) 147/86   Pulse 75   Wt (!) 326 lb (147.9 kg)   LMP 02/03/2019 Comment: Tubal Ligation  SpO2 98%   BMI 57.75 kg/m   General: Alert and oriented, in NAD Skin: Warm, dry, and intact; healing papule underneath L breast with minimal serous drainage and without fluctuance/induration; mild erythema and tenderness to palpation under R breast though without drainage or fluctuance HEENT: NCAT, EOM grossly normal, midline nasal septum Respiratory: Breathing and speaking comfortably on RA Extremities: Moves all extremities grossly equally Neurological: No gross focal deficit Psychiatric: Appropriate mood and affect   ASSESSMENT/PLAN:   Assessment & Plan Boil Question presence of mild hidradenitis suppurativa given recurrence and location in intertriginous areas. Reassuringly her exam is without fluctuance/induration, and she is afebrile. We will treat the evolving lesion with doxycyline for 5 days. I also sent in clindagel to use at the first sign of recurrent boils. She can also use benzoyl peroxide washes as needed. We discussed weight loss as one method of potentially reducing flairs. She will let us  know if she does not improve with these. Primary hypertension Uncontrolled likely in the setting of pain. She is reassuringly comfortable on exam. Follow up after treatment with PCP.   Stuart Redo, MD [facility]

## 2024-01-22 NOTE — Patient Instructions (Signed)
 I have sent in doxycycline  to take for 5 days. I have also sent in clindamycin  ointment to use in the future when you feel areas come up. You can also use benzoyl peroxide washes to help reduce bacteria. Continue to chat with Dr. Rumball about ways to help lose weight. Let us  know if these antibiotics do not help.

## 2024-01-22 NOTE — Assessment & Plan Note (Signed)
 Question presence of mild hidradenitis suppurativa given recurrence and location in intertriginous areas. Reassuringly her exam is without fluctuance/induration, and she is afebrile. We will treat the evolving lesion with doxycyline for 5 days. I also sent in clindagel to use at the first sign of recurrent boils. She can also use benzoyl peroxide washes as needed. We discussed weight loss as one method of potentially reducing flairs. She will let us  know if she does not improve with these.

## 2024-02-05 ENCOUNTER — Ambulatory Visit

## 2024-02-05 ENCOUNTER — Other Ambulatory Visit: Payer: Self-pay

## 2024-02-05 DIAGNOSIS — J4550 Severe persistent asthma, uncomplicated: Secondary | ICD-10-CM | POA: Diagnosis not present

## 2024-02-05 NOTE — Patient Instructions (Signed)
 Visit Information  [name] was given information about Medicaid Managed Care team care coordination services as a part of their Kelsey Bullock Medicaid benefit.   If you would like to schedule transportation through your Kelsey Bullock, please call the following number at least 2 days in advance of your appointment: [phone]   Rides for urgent appointments can also be made after hours by calling Member Services.  Call the Behavioral Health Crisis Line at [phone], at any time, 24 hours a day, 7 days a week. If you are in danger or need immediate medical attention call 911.   [name] - following are the goals we discussed in your visit today:   Goals Addressed             This Visit's Progress    VBCI RN Care Bullock   On track    Problems:  Chronic Disease Management support and education needs related to CHF and HTN  Goal: Over the next 30 days the Patient will demonstrate a decrease CHF in exacerbations as evidenced by decreased use of PRN lasix  demonstrate Improved adherence to prescribed treatment Bullock for CHF and HTN as evidenced by checking BP and weight daily work with Child psychotherapist to address Housing barriers related to the management of chronic disease as evidenced by review of electronic medical record and patient or social worker report      Interventions:   Heart Failure Interventions: Provided education on low sodium diet Reviewed Heart Failure Action Bullock in depth and provided written copy Assessed need for readable accurate scales in home Provided education about placing scale on hard, flat surface Advised patient to weigh each morning after emptying bladder Discussed importance of daily weight and advised patient to weigh and record daily Reviewed role of diuretics in prevention of fluid overload and management of heart failure; Discussed the importance of keeping all appointments with provider  Hypertension  Interventions: Advised patient to check BP daily and keep a log of readings Advised patient to reach out to office/pharmacist to schedule an appointment to review [place] [facility] instructions   Patient Self-Care Activities:  Attend all scheduled provider appointments Call provider office for new concerns or questions  Take medications as prescribed   call office if I gain more than 2 pounds in one day or 5 pounds in one week watch for swelling in feet, ankles and legs every day weigh myself daily Start checking BP and weight daily Patient will call PCP office/pharmacist to schedule appointment with Kelsey Bullock  Bullock:  Telephone follow up appointment with care management team member scheduled for:  03/04/24 at 2:30 PM          VBCI RN Care Bullock related to financial constraints   On track    Problems:  Care Coordination needs related to Financial Strain , Housing , and Transportation Difficulty obtaining medications Corporate treasurer.  Goal: Over the next 30 days the Patient will work with pharmacist to address Medication procurement related to difficulty affording medications as evidenced by review of electronic medical record and patient or pharmacist report    work with social worker to address Housing barriers and Transportation related to the management of chronic disease as evidenced by review of electronic medical record and patient or social worker report      Interventions:   Advised patient to contact pharmacist, Kelsey Bullock, for assistance with  MedAssist as discussed at previous visit with Kelsey Bullock  Patient Self-Care Activities:  Attend all scheduled provider appointments Call provider office for new concerns or questions  Take medications as prescribed   Work with the social worker to address care coordination needs and will continue to work with the clinical team to address health care and disease management related needs Work with the pharmacist to address  medication management needs and will continue to work with the clinical team to address health care and disease management related needs  Bullock:  Telephone follow up appointment with care management team member scheduled for:  03/04/24 at 2:30 PM              Patient verbalizes understanding of instructions and care Bullock provided today and agrees to view in MyChart. Active MyChart status and patient understanding of how to access instructions and care Bullock via MyChart confirmed with patient.     The Patient                                              will call Kelsey Bullock office* as advised to schedule follow-up visit for assistance with [place] [facility].  Telephone follow up appointment with Managed Medicaid care management team member scheduled for: 03/04/24 at 2:30 PM  Kelsey Finlay, RN MSN [place]  VBCI Population Health RN Care Manager Direct Dial: [phone]  Fax: [phone]   Following is a copy of your Bullock of care:  There are no care plans that you recently modified to display for this patient.

## 2024-02-05 NOTE — Patient Outreach (Signed)
 Care Coordination   02/05/2024 Name: Kelsey Bullock MRN: [id number] DOB: 07-06-1960   Care Coordination Outreach Attempts:  An unsuccessful outreach was attempted for an appointment today. Attempted x2 at time of scheduled appointment.  Follow Up Plan:  Additional outreach attempts will be made to complete CCM follow-up visit.   Encounter Outcome:  No Answer. Voicemail box full, unable to leave message.   [name], RN MSN [place]  VBCI Population Health RN Care Manager Direct Dial: [phone]  Fax: [phone]

## 2024-02-05 NOTE — Patient Outreach (Signed)
 Complex Care Management   Visit Note  02/05/2024  Name:  Kelsey Bullock MRN: [id number] DOB: 1960-09-28  Situation: Referral received for Complex Care Management related to Heart Failure and HTN I obtained verbal consent from Patient.  Visit completed with Patient  on the phone  Background:   Past Medical History:  Diagnosis Date   Allergic rhinitis    Anemia    Anxiety    Arthritis    knees and back   Asthma    Asthma 11/26/2006   Qualifier: Diagnosis of   By: Scarlet MD, Elsie         Complication of anesthesia    Congestive heart failure (CHF) (HCC)    Depression    Gall stone    GERD (gastroesophageal reflux disease)    Headache    migraines, stress headaches   History of kidney stones    Hypertension    Hyperthyroidism    during pregnancy   Pneumonia    PONV (postoperative nausea and vomiting)    Recurrent upper respiratory infection (URI)    Sleep apnea    can't use Cpap    Assessment: Patient Reported Symptoms:  Cognitive Cognitive Status: Able to follow simple commands, Alert and oriented to person, place, and time, Normal speech and language skills Cognitive/Intellectual Conditions Management [RPT]: None reported or documented in medical history or problem list      Neurological Neurological Review of Symptoms: Other: [place] Neurological Symptoms/Conditions [RPT]: Neuropathy Neurological Management Strategies: Routine screening Neurological Comment: Patient is scheduled with neurology 03/12/24  HEENT HEENT Symptoms Reported: No symptoms reported HEENT Management Strategies: Routine screening    Cardiovascular Cardiovascular Symptoms Reported: Swelling in legs or feet (BLE swelling, mostly in the ankles. Patient reports she has been taking her fluid pill for the last several days due to increased swelling.) Does patient have uncontrolled Hypertension?: Yes Is patient checking Blood Pressure at home?: No Cardiovascular Management Strategies: Medical  device, Medication therapy, Weight management Do You Have a Working Readable Scale?: Yes Cardiovascular Comment: Patient reports she still has not made the time to check weight or BP at home. Reveiwed strategies to remind herself to check BP and weight. Reviewed importance of monitoring at home.  Respiratory Respiratory Symptoms Reported: Other: Other Respiratory Symptoms: Chronic shortness of breath Additional Respiratory Details: Patient had Tezpire injection this morning per chart review. Respiratory Management Strategies: Adequate rest, Coping strategies  Endocrine Endocrine Symptoms Reported: No symptoms reported Is patient diabetic?: No    Gastrointestinal Gastrointestinal Symptoms Reported: Abdominal pain or discomfort Additional Gastrointestinal Details: Occasional nausea. She notes she has been trying to eat less. She reports she ate spicy food recently and this has caused some abdominal discomfort. She reports stools are formed and regular. Gastrointestinal Management Strategies: Medication therapy    Genitourinary Genitourinary Symptoms Reported: No symptoms reported    Integumentary Integumentary Symptoms Reported: Other Other Integumentary Symptoms: Patient was recently seen in office for draining L breast abscess. She was prescribed antibiotics but reports she did not complete full course, only took 3 days worth of antibiotics. She reports abscess is no longer draining. She is unsure if it is still there, but does report it is still sore. She reports uses Clindagel when I think about it once or twice. Skin Management Strategies: Medication therapy, Routine screening  Musculoskeletal Musculoskelatal Symptoms Reviewed: Limited mobility, Unsteady gait, Other Other Musculoskeletal Symptoms: Chronic generalized pain Musculoskeletal Management Strategies: Medical device Falls in the past year?: Yes Number of falls in past year:  2 or more (No falls since previous CMRN visit) Was  there an injury with Fall?: No Fall Risk Category Calculator: 2 Patient Fall Risk Level: Moderate Fall Risk Patient at Risk for Falls Due to: History of fall(s), Impaired balance/gait, Impaired mobility Fall risk Follow up: Falls evaluation completed, Education provided, Falls prevention discussed  Psychosocial            02/05/2024    PHQ2-9 Depression Screening   Little interest or pleasure in doing things    Feeling down, depressed, or hopeless    PHQ-2 - Total Score    Trouble falling or staying asleep, or sleeping too much    Feeling tired or having little energy    Poor appetite or overeating     Feeling bad about yourself - or that you are a failure or have let yourself or your family down    Trouble concentrating on things, such as reading the newspaper or watching television    Moving or speaking so slowly that other people could have noticed.  Or the opposite - being so fidgety or restless that you have been moving around a lot more than usual    Thoughts that you would be better off dead, or hurting yourself in some way    PHQ2-9 Total Score    If you checked off any problems, how difficult have these problems made it for you to do your work, take care of things at home, or get along with other people    Depression Interventions/Treatment      There were no vitals filed for this visit.  Medications Reviewed Today     Reviewed by Arno Rosaline SQUIBB, RN (Registered Nurse) on 02/05/24 at 1511  Med List Status: <None>   Medication Order Taking? Sig Documenting Provider Last Dose Status Informant  albuterol  (PROVENTIL ) (2.5 MG/3ML) 0.083% nebulizer solution [id number] No Take 3 mLs (2.5 mg total) by nebulization every 4 (four) hours as needed. USE 1 VIAL VIA NEBULIZER EVERY 4 TO 6 HOURS AS NEEDED FOR COUGH OR WHEEZING Kozlow, Camellia PARAS, MD Taking Active   albuterol  (VENTOLIN  HFA) 108 (90 Base) MCG/ACT inhaler [id number] No Inhale 2 puffs into the lungs every 4 (four) hours as  needed. Kozlow, Camellia PARAS, MD Taking Active   cetirizine  (ZYRTEC ) 10 MG tablet [id number] No Take 1 tablet (10 mg total) by mouth 2 (two) times daily as needed for allergies (Can take an extra dose for breakthrough symptoms). Kozlow, Camellia PARAS, MD Taking Active   Clindamycin  Phos, Once-Daily, (CLINDAGEL) 1 % GEL [id number]  Apply 1 application  topically 2 (two) times daily as needed (boils). Tharon Lung, MD  Active   cyclobenzaprine  (FLEXERIL ) 10 MG tablet [id number] No Take 1 tablet (10 mg total) by mouth 3 (three) times daily as needed for muscle spasms. Rosalynn Camie CROME, MD Taking Active   diclofenac  Sodium (VOLTAREN ) 1 % GEL [id number] No Apply 4 g topically 4 (four) times daily. Austin Ade, MD Taking Active   dicyclomine (BENTYL) 10 MG capsule [id number] No Take 10 mg by mouth every 6 (six) hours as needed. [provider] Taking Active   EPINEPHrine  (EPIPEN  2-PAK) 0.3 mg/0.3 mL IJ SOAJ injection [id number] No Inject 0.3 mg into the muscle as needed for anaphylaxis. Kozlow, Camellia PARAS, MD Taking Active   famotidine  (PEPCID ) 20 MG tablet [id number] No Take 1 tablet (20 mg total) by mouth in the morning. Kozlow, Camellia PARAS, MD Taking Active  Med Note (Naresh Althaus P   Mon Sep 03, 2023 10:34 AM) Patient reports taking as needed.  FLUARIX 0.5 ML injection [id number] No Inject 0.5 mLs into the muscle once. [provider] Taking Active   fluticasone  (FLONASE ) 50 MCG/ACT nasal spray [id number] No Place 1 spray into both nostrils daily. Maurilio Camellia PARAS, MD Taking Active            Med Note (Seibert Keeter P   Tue Oct 16, 2023 11:31 AM) Patient reports taking as needed  furosemide  (LASIX ) 20 MG tablet [id number] No Take 20 mg daily as needed for leg swelling.  If leg swelling persists, can take additional 20 mg in the evening for total of 40 mg that day. Shitarev, Dimitry, MD Taking Active   gabapentin  (NEURONTIN ) 100 MG capsule [id number]  Take 1 capsule (100 mg total) by mouth 3 (three)  times daily as needed. Rumball, Alison M, DO  Active   ipratropium (ATROVENT ) 0.06 % nasal spray [id number] No Place 2 sprays into both nostrils 4 (four) times daily. USE 2 SPRAYS INTO EACH NOSTRIL EVERY 6 HOURS AS NEEDED FOR TO DRY UP NOSE. Kozlow, Camellia PARAS, MD Taking Active   ketoconazole  (NIZORAL ) 2 % cream [id number] No Apply 1 Application topically 2 (two) times daily. Use for two weeks Joshua Domino, DO Taking Active   ketoconazole  (NIZORAL ) 2 % shampoo [id number] No Apply 1 Application topically 2 (two) times a week. Rosalynn Camie CROME, MD Taking Active   levocetirizine (XYZAL ) 5 MG tablet [id number] No Take 1 tablet (5 mg total) by mouth daily as needed for allergies (Can take an extra dose during flare ups.). Kozlow, Camellia PARAS, MD Taking Active   linaclotide  (LINZESS ) 72 MCG capsule [id number] No Take 2 capsules (144 mcg total) by mouth daily before breakfast. Scarlet Elsie LABOR, MD Taking Active            Med Note (Shalunda Lindh P   Mon Sep 03, 2023 10:35 AM) Patient reports taking as needed.  mometasone -formoterol  (DULERA ) 200-5 MCG/ACT AERO [id number] No Inhale 2 puffs into the lungs in the morning and at bedtime. Take twice daily for 1-2 weeks with respiratory illness or asthma flare. Maurilio Camellia PARAS, MD Taking Active            Med Note (Jahnaya Branscome P   Tue Oct 16, 2023 11:29 AM) Patient reports taking once a day  montelukast  (SINGULAIR ) 10 MG tablet [id number] No Take 1 tablet (10 mg total) by mouth at bedtime. Take one tablet by mouth at bedtime Kozlow, Camellia PARAS, MD Taking Active            Med Note (Chrishon Martino P   Tue Oct 16, 2023 11:32 AM) Patient reports taking as needed  NYSTATIN  powder [id number] No Apply topically 3 (three) times daily. [provider] Taking Active   Olopatadine  HCl 0.7 % SOLN [id number] No Apply 1 drop to eye daily. [provider] Taking Active            Med Note (Matayah Reyburn P   Tue Oct 16, 2023 11:32 AM) Patient reports taking as  needed  Omega-3 Fatty Acids (FISH OIL) 1200 MG CAPS [id number] No Take 1 capsule by mouth. [provider] Taking Active            Med Note (Kimberely Mccannon P   Mon Sep 03, 2023 10:36 AM) Patient reports taking when she remembers.  omeprazole  (PRILOSEC) 40 MG capsule [id number] No Take 1 capsule (  40 mg total) by mouth daily. Rumball, Alison M, DO Taking Active            Med Note (Kahli Fitzgerald P   Mon Sep 03, 2023 10:34 AM) Patient reports taking as needed.  Pyrithione Zinc  (DHS ZINC ) 2 % SHAM [id number] No Apply to scalp 3 to 5 times a week as ditreted Rumball, Alison M, DO Taking Active   Respiratory Therapy Supplies (NEBULIZER MASK ADULT) MISC [id number] No 1 Device by Does not apply route as needed. Kozlow, Camellia PARAS, MD Taking Active   rosuvastatin  (CRESTOR ) 10 MG tablet [id number] No Take 1 tablet (10 mg total) by mouth daily.  Patient not taking: Reported on 10/16/2023   Toma Matas, MD Not Taking Active   Spacer/Aero-Holding [facility] [id number] No Use as directed with inhaler. Kozlow, Camellia PARAS, MD Taking Active   Specialty Vitamins Products (MG PLUS PROTEIN) 133 MG TABS [id number] No Take 1 tablet by mouth daily.  Patient not taking: Reported on 10/16/2023   [provider] Not Taking Active   tezepelumab -ekko (TEZSPIRE ) 210 MG/1. syringe 210 mg [id number]   Kozlow, Eric J, MD  Active   TEZSPIRE  210 MG/1. syringe [id number]  Inject 210 mg into the skin every 28 (twenty-eight) days. [provider]  Active   triamcinolone  (KENALOG ) 0.025 % ointment [id number] No Apply topically 2 (two) times daily. Howell Lunger, DO Taking Active             Recommendation:   PCP Follow-up Specialty provider follow-up Dr. Koval - advised to call office and schedule follow-up Continue Current Plan of Care  Follow Up Plan:   Telephone follow up appointment date/time:  03/04/24 at 2:30 PM  [name], RN MSN [place]  [facility] RN  Care Manager Direct Dial: [phone]  Fax: [phone]

## 2024-02-11 ENCOUNTER — Other Ambulatory Visit: Payer: Self-pay | Admitting: Allergy and Immunology

## 2024-02-15 ENCOUNTER — Other Ambulatory Visit: Payer: Self-pay | Admitting: Family Medicine

## 2024-02-15 DIAGNOSIS — Z1231 Encounter for screening mammogram for malignant neoplasm of breast: Secondary | ICD-10-CM

## 2024-02-22 ENCOUNTER — Ambulatory Visit (INDEPENDENT_AMBULATORY_CARE_PROVIDER_SITE_OTHER): Admitting: Family Medicine

## 2024-02-22 ENCOUNTER — Encounter: Payer: Self-pay | Admitting: Family Medicine

## 2024-02-22 VITALS — Wt 325.6 lb

## 2024-02-22 DIAGNOSIS — Z599 Problem related to housing and economic circumstances, unspecified: Secondary | ICD-10-CM

## 2024-02-22 DIAGNOSIS — F411 Generalized anxiety disorder: Secondary | ICD-10-CM

## 2024-02-22 DIAGNOSIS — G629 Polyneuropathy, unspecified: Secondary | ICD-10-CM | POA: Diagnosis not present

## 2024-02-22 DIAGNOSIS — M797 Fibromyalgia: Secondary | ICD-10-CM | POA: Diagnosis not present

## 2024-02-22 DIAGNOSIS — Z23 Encounter for immunization: Secondary | ICD-10-CM

## 2024-02-22 MED ORDER — DULOXETINE HCL 30 MG PO CPEP: 30.0000 mg | ORAL_CAPSULE | Freq: Every day | ORAL | 0 refills | Status: AC

## 2024-02-22 NOTE — Assessment & Plan Note (Signed)
 Previous VBCI referral placed. Will refer to pharmacy to assist with medication needs.

## 2024-02-22 NOTE — Progress Notes (Addendum)
   SUBJECTIVE:   CHIEF COMPLAINT / HPI:   Neuropathy - A1c 5.8 09/2023, Hb, electrolytes normal. No alcohol use.  - described as sharp, electricity, sparking in lower legs with numbness in toes, knees lock up.  - constant, getting worse. Now feels it all over body. - sometimes with decreased grip strength. Has fear of falling due to knees locking up. - prescribed gabapentin  last visit, not started. Prefers natural remedies. - taking tumeric, pepper supplement OTC, not helping - Referred to neuro - has appt 10/8. - movement makes hurt more.  Financial difficulties - Previously referred to VBCI, didn't answer, having phone difficulties. Makes difficult to afford medications.   OBJECTIVE:   Wt (!) 325 lb 9.6 oz (147.7 kg)   LMP 02/03/2019 Comment: Tubal Ligation  BMI 57.68 kg/m   Gen: well appearing, in NAD Card: RRR Lungs: CTAB Ext: WWP, no edema MSK: marked tenderness to light palpation of b/l trap and upper shoulders and lower legs without notable swelling or overlying skin changes Psych: tearful   ASSESSMENT/PLAN:   Neuropathy Now appears to have more fibromyalgia picture. Labs, neuro exam previously unremarkable. Hesitant to try gabapentin . Will trial low dose SNRI. Has upcoming Neuro appt.   Fibromyalgia Pain now appears more consistent with fibromyalgia. Will start low dose SNRI given medication hesitation. Counseled on increased movement.  Financial difficulties Previous VBCI referral placed. Will refer to pharmacy to assist with medication needs.   Donald CHRISTELLA Lai, DO

## 2024-02-22 NOTE — Patient Instructions (Signed)
 It was great to see you!  Our plans for today:  - Make an appointment with Dr. Koval when you check out to get help with medication assistance.  - Try the duloxetine  for your pain. Keep your appointment with Dr. Tobie.  - Come back in 1 month.   Take care and seek immediate care sooner if you develop any concerns.   Dr. Zaivion Kundrat

## 2024-02-22 NOTE — Assessment & Plan Note (Signed)
 Now appears to have more fibromyalgia picture. Labs, neuro exam previously unremarkable. Hesitant to try gabapentin . Will trial low dose SNRI. Has upcoming Neuro appt.

## 2024-02-22 NOTE — Assessment & Plan Note (Signed)
 Pain now appears more consistent with fibromyalgia. Will start low dose SNRI given medication hesitation. Counseled on increased movement.

## 2024-03-01 ENCOUNTER — Other Ambulatory Visit: Payer: Self-pay

## 2024-03-01 ENCOUNTER — Encounter (HOSPITAL_COMMUNITY): Payer: Self-pay | Admitting: *Deleted

## 2024-03-01 ENCOUNTER — Emergency Department (HOSPITAL_COMMUNITY)

## 2024-03-01 ENCOUNTER — Emergency Department (HOSPITAL_COMMUNITY)
Admission: EM | Admit: 2024-03-01 | Discharge: 2024-03-02 | Disposition: A | Attending: Emergency Medicine | Admitting: Emergency Medicine

## 2024-03-01 DIAGNOSIS — I110 Hypertensive heart disease with heart failure: Secondary | ICD-10-CM | POA: Diagnosis not present

## 2024-03-01 DIAGNOSIS — I509 Heart failure, unspecified: Secondary | ICD-10-CM | POA: Insufficient documentation

## 2024-03-01 DIAGNOSIS — R110 Nausea: Secondary | ICD-10-CM | POA: Diagnosis not present

## 2024-03-01 DIAGNOSIS — R1033 Periumbilical pain: Secondary | ICD-10-CM | POA: Diagnosis not present

## 2024-03-01 DIAGNOSIS — Z91040 Latex allergy status: Secondary | ICD-10-CM | POA: Insufficient documentation

## 2024-03-01 LAB — CBC
HCT: 40.9 % (ref 36.0–46.0)
Hemoglobin: 13.1 g/dL (ref 12.0–15.0)
MCH: 29.3 pg (ref 26.0–34.0)
MCHC: 32 g/dL (ref 30.0–36.0)
MCV: 91.5 fL (ref 80.0–100.0)
Platelets: 216 K/uL (ref 150–400)
RBC: 4.47 MIL/uL (ref 3.87–5.11)
RDW: 14.1 % (ref 11.5–15.5)
WBC: 7.2 K/uL (ref 4.0–10.5)
nRBC: 0 % (ref 0.0–0.2)

## 2024-03-01 LAB — URINALYSIS, ROUTINE W REFLEX MICROSCOPIC
Bilirubin Urine: NEGATIVE
Glucose, UA: NEGATIVE mg/dL
Hgb urine dipstick: NEGATIVE
Ketones, ur: NEGATIVE mg/dL
Nitrite: NEGATIVE
Protein, ur: NEGATIVE mg/dL
Specific Gravity, Urine: 1.012 (ref 1.005–1.030)
pH: 6 (ref 5.0–8.0)

## 2024-03-01 LAB — COMPREHENSIVE METABOLIC PANEL WITH GFR
ALT: 13 U/L (ref 0–44)
AST: 20 U/L (ref 15–41)
Albumin: 3.8 g/dL (ref 3.5–5.0)
Alkaline Phosphatase: 93 U/L (ref 38–126)
Anion gap: 11 (ref 5–15)
BUN: 17 mg/dL (ref 8–23)
CO2: 25 mmol/L (ref 22–32)
Calcium: 9.2 mg/dL (ref 8.9–10.3)
Chloride: 102 mmol/L (ref 98–111)
Creatinine, Ser: 0.68 mg/dL (ref 0.44–1.00)
GFR, Estimated: >60 mL/min (ref >60)
Glucose, Bld: 120 mg/dL — ABNORMAL HIGH (ref 70–99)
Potassium: 4.2 mmol/L (ref 3.5–5.1)
Sodium: 138 mmol/L (ref 135–145)
Total Bilirubin: 0.6 mg/dL (ref 0.0–1.2)
Total Protein: 6.5 g/dL (ref 6.5–8.1)

## 2024-03-01 NOTE — ED Provider Notes (Signed)
  EMERGENCY DEPARTMENT AT [facility] Provider Note   CSN: [id number] Arrival date & time: 03/01/24  2119     Patient presents with: Abdominal Pain   Kelsey Bullock is a 63 y.o. female.  {Add pertinent medical, surgical, social history, OB history to HPI:32947}  Abdominal Pain      Prior to Admission medications   Medication Sig Start Date End Date Taking? Authorizing Provider  albuterol  (PROVENTIL ) (2.5 MG/3ML) 0.083% nebulizer solution Take 3 mLs (2.5 mg total) by nebulization every 4 (four) hours as needed. USE 1 VIAL VIA NEBULIZER EVERY 4 TO 6 HOURS AS NEEDED FOR COUGH OR WHEEZING 09/11/23   Kozlow, Camellia PARAS, MD  albuterol  (VENTOLIN  HFA) 108 (90 Base) MCG/ACT inhaler Inhale 2 puffs into the lungs every 4 (four) hours as needed. 09/11/23   Kozlow, Camellia PARAS, MD  cetirizine  (ZYRTEC ) 10 MG tablet Take 1 tablet (10 mg total) by mouth 2 (two) times daily as needed for allergies (Can take an extra dose for breakthrough symptoms). 05/08/23   Kozlow, Camellia PARAS, MD  Clindamycin  Phos, Once-Daily, (CLINDAGEL) 1 % GEL Apply 1 application  topically 2 (two) times daily as needed (boils). 01/22/24   Tharon Lung, MD  cyclobenzaprine  (FLEXERIL ) 10 MG tablet Take 1 tablet (10 mg total) by mouth 3 (three) times daily as needed for muscle spasms. 12/06/22   Rosalynn Camie CROME, MD  diclofenac  Sodium (VOLTAREN ) 1 % GEL Apply 4 g topically 4 (four) times daily. 08/15/21   Austin Ade, MD  dicyclomine (BENTYL) 10 MG capsule Take 10 mg by mouth every 6 (six) hours as needed. 05/01/23   [provider]  DULoxetine  (CYMBALTA ) 30 MG capsule Take 1 capsule (30 mg total) by mouth daily. 02/22/24   Rumball, Alison M, DO  EPINEPHrine  (EPIPEN  2-PAK) 0.3 mg/0.3 mL IJ SOAJ injection Inject 0.3 mg into the muscle as needed for anaphylaxis. 05/08/23   Kozlow, Camellia PARAS, MD  famotidine  (PEPCID ) 20 MG tablet Take 1 tablet (20 mg total) by mouth in the morning. 05/08/23   Kozlow, Camellia PARAS, MD  fluticasone  (FLONASE )  50 MCG/ACT nasal spray Place 1 spray into both nostrils daily. 09/11/23   Kozlow, Camellia PARAS, MD  furosemide  (LASIX ) 20 MG tablet Take 20 mg daily as needed for leg swelling.  If leg swelling persists, can take additional 20 mg in the evening for total of 40 mg that day. 09/14/23   Shitarev, Dimitry, MD  gabapentin  (NEURONTIN ) 100 MG capsule Take 1 capsule (100 mg total) by mouth 3 (three) times daily as needed. 11/19/23   Rumball, Alison M, DO  ipratropium (ATROVENT ) 0.06 % nasal spray Place 2 sprays into both nostrils 4 (four) times daily. USE 2 SPRAYS INTO EACH NOSTRIL EVERY 6 HOURS AS NEEDED FOR TO DRY UP NOSE. 05/08/23   Kozlow, Camellia PARAS, MD  ketoconazole  (NIZORAL ) 2 % cream Apply 1 Application topically 2 (two) times daily. Use for two weeks 09/27/23   Joshua Domino, DO  ketoconazole  (NIZORAL ) 2 % shampoo Apply 1 Application topically 2 (two) times a week. 12/07/22   Rosalynn Camie CROME, MD  levocetirizine (XYZAL ) 5 MG tablet Take 1 tablet (5 mg total) by mouth daily as needed for allergies (Can take an extra dose during flare ups.). 09/11/23   Kozlow, Camellia PARAS, MD  linaclotide  (LINZESS ) 72 MCG capsule Take 2 capsules (144 mcg total) by mouth daily before breakfast. 04/20/22   Scarlet Elsie LABOR, MD  mometasone -formoterol  (DULERA ) 200-5 MCG/ACT AERO Inhale 2 puffs  into the lungs in the morning and at bedtime. Take twice daily for 1-2 weeks with respiratory illness or asthma flare. 09/11/23   Kozlow, Camellia PARAS, MD  montelukast  (SINGULAIR ) 10 MG tablet Take 1 tablet (10 mg total) by mouth at bedtime. Take one tablet by mouth at bedtime 09/11/23   Kozlow, Camellia PARAS, MD  NYSTATIN  powder Apply topically 3 (three) times daily. 03/26/23   [provider]  Olopatadine  HCl 0.7 % SOLN Apply 1 drop to eye daily. 03/22/21   [provider]  Omega-3 Fatty Acids (FISH OIL) 1200 MG CAPS Take 1 capsule by mouth. 01/12/12   [provider]  omeprazole  (PRILOSEC) 40 MG capsule Take 1 capsule (40 mg total) by mouth daily.  04/05/23   Rumball, Alison M, DO  Pyrithione Zinc  (DHS ZINC ) 2 % SHAM Apply to scalp 3 to 5 times a week as ditreted 07/06/23   Rumball, Alison M, DO  Respiratory Therapy Supplies (NEBULIZER MASK ADULT) MISC 1 Device by Does not apply route as needed. 05/08/23   Kozlow, Camellia PARAS, MD  rosuvastatin  (CRESTOR ) 10 MG tablet Take 1 tablet (10 mg total) by mouth daily. Patient not taking: Reported on 10/16/2023 09/14/23   Toma Matas, MD  Spacer/Aero-Holding Raguel FRENCH Use as directed with inhaler. 05/08/23   Kozlow, Camellia PARAS, MD  Specialty Vitamins Products (MG PLUS PROTEIN) 133 MG TABS Take 1 tablet by mouth daily. Patient not taking: Reported on 10/16/2023 08/30/21   [provider]  TEZSPIRE  210 MG/1. syringe INJECT 210 MG UNDER THE SKIN EVERY 4 WEEKS (TO BE ADMINISTERED BY A HEALTHCARE PROVIDER IN A HEALTHCARE SETTING) 02/12/24   Kozlow, Camellia PARAS, MD  triamcinolone  (KENALOG ) 0.025 % ointment Apply topically 2 (two) times daily. 09/05/23   Howell Lunger, DO    Allergies: Amoxicillin, Etodolac, Penicillins, Latex, Pantoprazole sodium, Wound dressing adhesive, Ampicillin, Augmentin [amoxicillin-pot clavulanate], Diclofenac  sodium, Pantoprazole sodium, and Adhesive [tape]    Review of Systems  Gastrointestinal:  Positive for abdominal pain.    Updated Vital Signs BP (!) 166/68 (BP Location: Right Arm)   Pulse 86   Temp 99.1 F (37.3 C)   Resp (!) 25   Ht 5' 3 (1.6 m)   Wt (!) 147.7 kg   LMP 02/03/2019 Comment: Tubal Ligation  SpO2 98%   BMI 57.68 kg/m   Physical Exam  (all labs ordered are listed, but only abnormal results are displayed) Labs Reviewed  COMPREHENSIVE METABOLIC PANEL WITH GFR - Abnormal; Notable for the following components:      Result Value   Glucose, Bld 120 (*)    All other components within normal limits  URINALYSIS, ROUTINE W REFLEX MICROSCOPIC - Abnormal; Notable for the following components:   Leukocytes,Ua TRACE (*)    Bacteria, UA RARE (*)    All  other components within normal limits  CBC  LIPASE, BLOOD    EKG: None  Radiology: No results found.  {Document cardiac monitor, telemetry assessment procedure when appropriate:32947} Procedures   Medications Ordered in the ED - No data to display    {Click here for ABCD2, HEART and other calculators REFRESH Note before signing:1}                              Medical Decision Making Amount and/or Complexity of Data Reviewed Radiology: ordered.   ***  {Document critical care time when appropriate  Document review of labs and clinical decision tools ie CHADS2VASC2,  etc  Document your independent review of radiology images and any outside records  Document your discussion with family members, caretakers and with consultants  Document social determinants of health affecting pt's care  Document your decision making why or why not admission, treatments were needed:32947:::1}   Final diagnoses:  None    ED Discharge Orders     None

## 2024-03-01 NOTE — ED Triage Notes (Signed)
 Bruise  around her belly button since last night some nausea .  She has had 2  hernia surgeries in the past

## 2024-03-02 ENCOUNTER — Emergency Department (HOSPITAL_COMMUNITY)

## 2024-03-02 DIAGNOSIS — R109 Unspecified abdominal pain: Secondary | ICD-10-CM | POA: Diagnosis not present

## 2024-03-02 DIAGNOSIS — K429 Umbilical hernia without obstruction or gangrene: Secondary | ICD-10-CM | POA: Diagnosis not present

## 2024-03-02 DIAGNOSIS — S301XXA Contusion of abdominal wall, initial encounter: Secondary | ICD-10-CM | POA: Diagnosis not present

## 2024-03-02 LAB — LIPASE, BLOOD: Lipase: 27 U/L (ref 11–51)

## 2024-03-02 MED ORDER — ONDANSETRON HCL 4 MG/2ML IJ SOLN
4.0000 mg | Freq: Once | INTRAMUSCULAR | Status: AC
  Administered 2024-03-02: 4 mg via INTRAVENOUS
  Filled 2024-03-02: qty 2

## 2024-03-02 MED ORDER — FENTANYL CITRATE PF 50 MCG/ML IJ SOSY
50.0000 ug | PREFILLED_SYRINGE | Freq: Once | INTRAMUSCULAR | Status: AC
  Administered 2024-03-02: 50 ug via INTRAVENOUS
  Filled 2024-03-02: qty 1

## 2024-03-02 MED ORDER — DOXYCYCLINE HYCLATE 100 MG PO CAPS: 100.0000 mg | ORAL_CAPSULE | Freq: Two times a day (BID) | ORAL | 0 refills | Status: AC

## 2024-03-02 MED ORDER — IOHEXOL 350 MG/ML SOLN
100.0000 mL | Freq: Once | INTRAVENOUS | Status: AC | PRN
  Administered 2024-03-02: 100 mL via INTRAVENOUS

## 2024-03-02 NOTE — Discharge Instructions (Signed)
 Your CT scan showed a possible hematoma but also showed a possible developing cellulitis.  Based on your physical examination I feel this is likely hematoma, or small blood collection.  This should get better on its own over time.  I will prescribe a short course of antibiotics in case there is some mild infectious process developing there.  Please take the antibiotics as directed until complete and follow-up with your primary care provider.  If you develop any life-threatening symptoms please return to the emergency department.

## 2024-03-02 NOTE — ED Provider Notes (Incomplete)
  EMERGENCY DEPARTMENT AT [facility] Provider Note   CSN: [id number] Arrival date & time: 03/01/24  2119     Patient presents with: Abdominal Pain   Kelsey Bullock is a 63 y.o. female.  Patient with past medical history significant for anxiety, depression, GERD, CHF, hypertension, previous abdominal surgery including exploratory laparotomy, inguinal hernia repair, ventral hernia repair presents the emergency department complaining of pain in the umbilical region.  Patient states that she was lifting moving heavy objects earlier in the week.  She states that sometime in the past 2 to 3 days she began to have pain in the umbilical region.  Now she is complaining of pain throughout her abdomen.  She looked in the mirror earlier today and noticed bruising to the left side of her umbilicus and decided to have it evaluated.  She endorses some mild nausea as well.  She denies emesis, diarrhea, chest pain, shortness of breath, fever  {Add pertinent medical, surgical, social history, OB history to HPI:32947}  Abdominal Pain      Prior to Admission medications   Medication Sig Start Date End Date Taking? Authorizing Provider  albuterol  (PROVENTIL ) (2.5 MG/3ML) 0.083% nebulizer solution Take 3 mLs (2.5 mg total) by nebulization every 4 (four) hours as needed. USE 1 VIAL VIA NEBULIZER EVERY 4 TO 6 HOURS AS NEEDED FOR COUGH OR WHEEZING 09/11/23   Kozlow, Camellia PARAS, MD  albuterol  (VENTOLIN  HFA) 108 (90 Base) MCG/ACT inhaler Inhale 2 puffs into the lungs every 4 (four) hours as needed. 09/11/23   Kozlow, Camellia PARAS, MD  cetirizine  (ZYRTEC ) 10 MG tablet Take 1 tablet (10 mg total) by mouth 2 (two) times daily as needed for allergies (Can take an extra dose for breakthrough symptoms). 05/08/23   Kozlow, Camellia PARAS, MD  Clindamycin  Phos, Once-Daily, (CLINDAGEL) 1 % GEL Apply 1 application  topically 2 (two) times daily as needed (boils). 01/22/24   Tharon Lung, MD  cyclobenzaprine  (FLEXERIL ) 10 MG  tablet Take 1 tablet (10 mg total) by mouth 3 (three) times daily as needed for muscle spasms. 12/06/22   Rosalynn Camie CROME, MD  diclofenac  Sodium (VOLTAREN ) 1 % GEL Apply 4 g topically 4 (four) times daily. 08/15/21   Austin Ade, MD  dicyclomine (BENTYL) 10 MG capsule Take 10 mg by mouth every 6 (six) hours as needed. 05/01/23   [provider]  DULoxetine  (CYMBALTA ) 30 MG capsule Take 1 capsule (30 mg total) by mouth daily. 02/22/24   Rumball, Alison M, DO  EPINEPHrine  (EPIPEN  2-PAK) 0.3 mg/0.3 mL IJ SOAJ injection Inject 0.3 mg into the muscle as needed for anaphylaxis. 05/08/23   Kozlow, Camellia PARAS, MD  famotidine  (PEPCID ) 20 MG tablet Take 1 tablet (20 mg total) by mouth in the morning. 05/08/23   Kozlow, Camellia PARAS, MD  fluticasone  (FLONASE ) 50 MCG/ACT nasal spray Place 1 spray into both nostrils daily. 09/11/23   Kozlow, Camellia PARAS, MD  furosemide  (LASIX ) 20 MG tablet Take 20 mg daily as needed for leg swelling.  If leg swelling persists, can take additional 20 mg in the evening for total of 40 mg that day. 09/14/23   Shitarev, Dimitry, MD  gabapentin  (NEURONTIN ) 100 MG capsule Take 1 capsule (100 mg total) by mouth 3 (three) times daily as needed. 11/19/23   Rumball, Alison M, DO  ipratropium (ATROVENT ) 0.06 % nasal spray Place 2 sprays into both nostrils 4 (four) times daily. USE 2 SPRAYS INTO EACH NOSTRIL EVERY 6 HOURS AS NEEDED FOR  TO DRY UP NOSE. 05/08/23   Kozlow, Camellia PARAS, MD  ketoconazole  (NIZORAL ) 2 % cream Apply 1 Application topically 2 (two) times daily. Use for two weeks 09/27/23   Joshua Domino, DO  ketoconazole  (NIZORAL ) 2 % shampoo Apply 1 Application topically 2 (two) times a week. 12/07/22   Rosalynn Camie CROME, MD  levocetirizine (XYZAL ) 5 MG tablet Take 1 tablet (5 mg total) by mouth daily as needed for allergies (Can take an extra dose during flare ups.). 09/11/23   Kozlow, Camellia PARAS, MD  linaclotide  (LINZESS ) 72 MCG capsule Take 2 capsules (144 mcg total) by mouth daily before breakfast. 04/20/22   Hensel,  Elsie LABOR, MD  mometasone -formoterol  (DULERA ) 200-5 MCG/ACT AERO Inhale 2 puffs into the lungs in the morning and at bedtime. Take twice daily for 1-2 weeks with respiratory illness or asthma flare. 09/11/23   Kozlow, Camellia PARAS, MD  montelukast  (SINGULAIR ) 10 MG tablet Take 1 tablet (10 mg total) by mouth at bedtime. Take one tablet by mouth at bedtime 09/11/23   Kozlow, Camellia PARAS, MD  NYSTATIN  powder Apply topically 3 (three) times daily. 03/26/23   [provider]  Olopatadine  HCl 0.7 % SOLN Apply 1 drop to eye daily. 03/22/21   [provider]  Omega-3 Fatty Acids (FISH OIL) 1200 MG CAPS Take 1 capsule by mouth. 01/12/12   [provider]  omeprazole  (PRILOSEC) 40 MG capsule Take 1 capsule (40 mg total) by mouth daily. 04/05/23   Rumball, Alison M, DO  Pyrithione Zinc  (DHS ZINC ) 2 % SHAM Apply to scalp 3 to 5 times a week as ditreted 07/06/23   Rumball, Alison M, DO  Respiratory Therapy Supplies (NEBULIZER MASK ADULT) MISC 1 Device by Does not apply route as needed. 05/08/23   Kozlow, Camellia PARAS, MD  rosuvastatin  (CRESTOR ) 10 MG tablet Take 1 tablet (10 mg total) by mouth daily. Patient not taking: Reported on 10/16/2023 09/14/23   Toma Matas, MD  Spacer/Aero-Holding Raguel FRENCH Use as directed with inhaler. 05/08/23   Kozlow, Camellia PARAS, MD  Specialty Vitamins Products (MG PLUS PROTEIN) 133 MG TABS Take 1 tablet by mouth daily. Patient not taking: Reported on 10/16/2023 08/30/21   [provider]  TEZSPIRE  210 MG/1. syringe INJECT 210 MG UNDER THE SKIN EVERY 4 WEEKS (TO BE ADMINISTERED BY A HEALTHCARE PROVIDER IN A HEALTHCARE SETTING) 02/12/24   Kozlow, Camellia PARAS, MD  triamcinolone  (KENALOG ) 0.025 % ointment Apply topically 2 (two) times daily. 09/05/23   Howell Lunger, DO    Allergies: Amoxicillin, Etodolac, Penicillins, Latex, Pantoprazole sodium, Wound dressing adhesive, Ampicillin, Augmentin [amoxicillin-pot clavulanate], Diclofenac  sodium, Pantoprazole sodium, and Adhesive  [tape]    Review of Systems  Gastrointestinal:  Positive for abdominal pain.    Updated Vital Signs BP (!) 166/68 (BP Location: Right Arm)   Pulse 86   Temp 99.1 F (37.3 C)   Resp (!) 25   Ht 5' 3 (1.6 m)   Wt (!) 147.7 kg   LMP 02/03/2019 Comment: Tubal Ligation  SpO2 98%   BMI 57.68 kg/m   Physical Exam  (all labs ordered are listed, but only abnormal results are displayed) Labs Reviewed  COMPREHENSIVE METABOLIC PANEL WITH GFR - Abnormal; Notable for the following components:      Result Value   Glucose, Bld 120 (*)    All other components within normal limits  URINALYSIS, ROUTINE W REFLEX MICROSCOPIC - Abnormal; Notable for the following components:   Leukocytes,Ua TRACE (*)    Bacteria, UA  RARE (*)    All other components within normal limits  CBC  LIPASE, BLOOD    EKG: None  Radiology: No results found.  {Document cardiac monitor, telemetry assessment procedure when appropriate:32947} Procedures   Medications Ordered in the ED - No data to display    {Click here for ABCD2, HEART and other calculators REFRESH Note before signing:1}                              Medical Decision Making Amount and/or Complexity of Data Reviewed Radiology: ordered.   ***  {Document critical care time when appropriate  Document review of labs and clinical decision tools ie CHADS2VASC2, etc  Document your independent review of radiology images and any outside records  Document your discussion with family members, caretakers and with consultants  Document social determinants of health affecting pt's care  Document your decision making why or why not admission, treatments were needed:32947:::1}   Final diagnoses:  None    ED Discharge Orders     None

## 2024-03-03 ENCOUNTER — Ambulatory Visit
Admission: RE | Admit: 2024-03-03 | Discharge: 2024-03-03 | Disposition: A | Discharge status: Home / Self Care | Source: Ambulatory Visit | Attending: Family Medicine | Admitting: Family Medicine

## 2024-03-03 DIAGNOSIS — Z1231 Encounter for screening mammogram for malignant neoplasm of breast: Secondary | ICD-10-CM

## 2024-03-04 ENCOUNTER — Encounter: Payer: Self-pay | Admitting: Pharmacist

## 2024-03-04 ENCOUNTER — Telehealth: Payer: Self-pay

## 2024-03-04 ENCOUNTER — Ambulatory Visit

## 2024-03-04 ENCOUNTER — Ambulatory Visit (INDEPENDENT_AMBULATORY_CARE_PROVIDER_SITE_OTHER): Admitting: Family Medicine

## 2024-03-04 ENCOUNTER — Ambulatory Visit: Admitting: Pharmacist

## 2024-03-04 VITALS — BP 129/68 | HR 70

## 2024-03-04 VITALS — BP 122/57 | HR 69 | Temp 98.1°F | Ht 63.0 in | Wt 333.4 lb

## 2024-03-04 DIAGNOSIS — T148XXA Other injury of unspecified body region, initial encounter: Secondary | ICD-10-CM | POA: Diagnosis present

## 2024-03-04 DIAGNOSIS — Z79899 Other long term (current) drug therapy: Secondary | ICD-10-CM

## 2024-03-04 NOTE — Progress Notes (Signed)
 S:     Chief Complaint  Patient presents with   Medication Management    Polypharmacy   63 y.o. who presents for medication management due to concern of polypharmacy.  Patient arrives in good spirits and presents without assistance.  Patient is accompanied by her son Inge -- also reviewed his medications as well. Patient reports that her and her son share medications.   Reports that her son has BPD and last fall had an overdose of tramadol  -- this concerned her so some of her son's medications she keeps in her room away from him.   Patient was referred and last seen by Primary Care Provider, Dr. Madelon, on 02/22/2024.  At last visit patient reported neuropathic pain for which she was prescribed gabapentin   PMH is significant for astham, eczema, dermatitis, rhinitis, recurrent ventral hernia, OA of L knee, PTSD, OCD, obesity, neuropathy, IBS-C, hypertension, hyperlipidemia, GERD, GAD, Fibromyalgia, CHF, Depression.   Insurance Coverage: [place] Medicaid  Do you know what each of your medicines is for? yes Do you feel that your medications are working for you? yes Do you ever have trouble remembering to take your medicine? yes Do you ever not take a medicine because you feel you do not need it? no Do you have any problems obtaining medications due to finances? yes Do you have any problems obtaining medications due to transportation? yes In the past 6 months, have you missed getting a refill or a new prescription filled on time? yes Do you have any physical problems such as vision loss or trouble opening bottles that keep you from taking your medicines as prescribed?no Patient has extensive medication supply - brought in medications and went through all medications brought in - disposed of medications out of date/those patient no longer used  Within the past 12 months, did you worry whether your food would run out before you got money to buy more? yes Within the past 12 months, did  the food you bought run out, and you didn't have money to get more? yes  O:  Review of Systems  Gastrointestinal:  Positive for abdominal pain.  Musculoskeletal:  Positive for joint pain.  All other systems reviewed and are negative.   Physical Exam Vitals reviewed.  Constitutional:      Appearance: Normal appearance. She is obese.  Pulmonary:     Effort: Pulmonary effort is normal.  Musculoskeletal:     Right lower leg: Edema present.     Left lower leg: Edema present.  Skin:    Findings: Bruising present.  Neurological:     Mental Status: She is alert.  Psychiatric:        Mood and Affect: Mood normal.        Behavior: Behavior normal.        Thought Content: Thought content normal.        Judgment: Judgment normal.    Vitals:   03/04/24 1523 03/04/24 1524  BP: (!) 158/88 129/68  Pulse: 70   SpO2: 96%    A/P: Polypharmacy - Medication Reconciliation - Medication list reviewed and updated.   Understanding of regimen: fair - patient has extensive medication regimen, and knows what medications are for, has large supply of medications on hand - many of which are expired -- worked through medications she brought in and discarded of expired medications.  Understanding of indications: fair  Potential of compliance: fair - shares some medications with her son and there is sometimes confusion on where the  medications are located in house Patient has known adherence challenges (confusion with regimen and excess of medications on hand) based on above barriers, including physical barriers such as cost. -Discussed that if cost is prohibitive to accessing medications that she feels are most important to call clinic - The following issues were noted extensive medication regimen with excessive supply on hand that creates confusion of regimen and prohibits adherence.   Written patient instructions and updated medication list provided. Total time in face to face counseling 64 minutes.     Follow up pharmacist visit 04/07/2024 Patient seen with Estefana Blase, PharmD Candidate - PY4 student.

## 2024-03-04 NOTE — Patient Outreach (Signed)
 Care Coordination   03/04/2024 Name: Kelsey Bullock MRN: [id number] DOB: 03/12/1961   Care Coordination Outreach Attempts:  An unsuccessful outreach was attempted for an appointment today.  Follow Up Plan:  Additional outreach attempts will be made to complete CCM follow-up visit.   Encounter Outcome:  No Answer. HIPAA compliant voicemail left requesting return call.   [name], RN MSN [place]  [facility] RN Care Manager Direct Dial: [phone]  Fax: [phone]

## 2024-03-04 NOTE — Patient Instructions (Signed)
 Kelsey Bullock - I am sorry I was unable to reach you today for our scheduled appointment. I work with Rumball, Alison M, DO and am calling to support your healthcare needs. Please contact me at [phone] at your earliest convenience. I look forward to speaking with you soon.   Thank you,  [name], RN MSN [place]  [facility] RN Care Manager Direct Dial: [phone]  Fax: [phone]

## 2024-03-04 NOTE — Progress Notes (Signed)
    SUBJECTIVE:   CHIEF COMPLAINT / HPI:   Abdominal pain/ Hematoma  Patient went to ED on 9/27 for periumbilical bruising and abdominal pain. She says that she has been lifting a lot at her home as they are moving and she is worried that caused the bleeding. She is concerned because she says the people around her say she smells like blood. Patient has significant history of multiple abdominal surgeries including exploratory laparotomy, inguinal hernia repair, ventral hernia repair  At the ED patient was stable. CT abdomen pelvis showed Infraumbilical loosely organized fluid and edema and mild subcutaneous fat stranding . Differential includes contusion and small hematoma, or developing abscess and cellulitis. No drainable fluid collection. She was sent home with a course of doxycycline  for possible cellulitis. Patient was not able to start the doxy until two days ago. She denies fevers. Reports her bruising has gotten better. She continues to have generalized abdominal and back pain. She does have some worse pain in the area of the hematoma.   PERTINENT  PMH / PSH: H/o exploratory laparotomy, inguinal hernia repair, ventral hernia repair   OBJECTIVE:   BP (!) 122/57   Pulse 69   Temp 98.1 F (36.7 C)   Ht 5' 3 (1.6 m)   Wt (!) 333 lb 6.4 oz (151.2 kg)   LMP 02/03/2019 Comment: Tubal Ligation  SpO2 96%   BMI 59.06 kg/m   General: well appearing, in no acute distress  Resp: no increased work of breathing on room air  Abd: soft, non distended, generalized abdominal tenderness, more tender to palpation in the area of 9cm ring of ecchymosis left of umbilicus. Small mass palpable beneath the skin within the ring of ecchymosis. No guarding. Patient is able to move and walk without worsened abdominal pain. No hernias.  Pictures in media tab.   ASSESSMENT/PLAN:   Assessment & Plan Hematoma Patient's hematoma improving. Most likely does not have infection given no warmth or erythema. Concern  for hematoma caused by increased lifting activity and possible complication of recurrent ventral hernia.  - Recommended patient continue tylenol , her other chronic pain medications, heating pad for hematoma.  - Advised patient to avoid lifting heavy objects until hematoma resolves.  - Advised to follow up with her general surgeon.      Areta Saliva, MD [facility]

## 2024-03-04 NOTE — Patient Instructions (Signed)
 It was nice to see you today!  Continue all other medication the same.   PLEASE BRING ALL YOUR MEDICATIONS TO YOUR NEXT APPOINTMENT - ALSO BRING YOUR EXTRA MEDICATIONS THAT MAY BE EXPIRED!

## 2024-03-04 NOTE — Assessment & Plan Note (Signed)
 Polypharmacy - Medication Reconciliation - Medication list reviewed and updated.   Understanding of regimen: fair - patient has extensive medication regimen, and knows what medications are for, has large supply of medications on hand - many of which are expired -- worked through medications she brought in and discarded of expired medications.  Understanding of indications: fair  Potential of compliance: fair - shares some medications with her son and there is sometimes confusion on where the medications are located in house Patient has known adherence challenges (confusion with regimen and excess of medications on hand) based on above barriers, including physical barriers such as cost. -Discussed that if cost is prohibitive to accessing medications that she feels are most important to call clinic - The following issues were noted extensive medication regimen with excessive supply on hand that creates confusion of regimen and prohibits adherence.

## 2024-03-04 NOTE — Patient Instructions (Signed)
 It was wonderful to see you today.  Please bring ALL of your medications with you to every visit.   Today we talked about:  Hematoma - Your hematoma is resolving. There does not seem to be any infection. You can stop the doxycycline  antibiotic. Let us  know if you start to have any fevers or increased pain. You can also use tylenol  and a heating pad to help with pain and help the hematoma break up. Try to follow up with your surgeon regarding the ventral hernia repair and if they are concerned about this hematoma.   Thank you for choosing [facility].   Please call [phone] with any questions about today's appointment.  Areta Saliva, MD  Family Medicine

## 2024-03-05 ENCOUNTER — Other Ambulatory Visit: Payer: Self-pay

## 2024-03-05 NOTE — Patient Instructions (Signed)
 Visit Information  [name] was given information about Medicaid Managed Care team care coordination services as a part of their [facility] Medicaid benefit.   If you would like to schedule transportation through your [facility], please call the following number at least 2 days in advance of your appointment: [phone]   Rides for urgent appointments can also be made after hours by calling Member Services.  Call the Behavioral Health Crisis Line at [phone], at any time, 24 hours a day, 7 days a week. If you are in danger or need immediate medical attention call 911.   Patient verbalizes understanding of instructions and care plan provided today and agrees to view in MyChart. Active MyChart status and patient understanding of how to access instructions and care plan via MyChart confirmed with patient.     Telephone follow up appointment with Managed Medicaid care management team member scheduled for: 04/03/24 at 1 PM  [name], RN MSN [place]  [facility] Population [facility] RN Care Manager Direct Dial: [phone]  Fax: [phone]   Following is a copy of your plan of care:  There are no care plans that you recently modified to display for this patient.

## 2024-03-05 NOTE — Progress Notes (Signed)
 Reviewed and agree with Dr Rennis plan.

## 2024-03-05 NOTE — Patient Outreach (Signed)
 Complex Care Management   Visit Note  03/05/2024  Name:  Kelsey Bullock MRN: [id number] DOB: 20-Apr-1961  Situation: Referral received for Complex Care Management related to Heart Failure and HTN I obtained verbal consent from Patient.  Visit completed with Patient  on the phone  Background:   Past Medical History:  Diagnosis Date   Allergic rhinitis    Anemia    Anxiety    Arthritis    knees and back   Asthma    Asthma 11/26/2006   Qualifier: Diagnosis of   By: Scarlet MD, Elsie         Complication of anesthesia    Congestive heart failure (CHF) (HCC)    Depression    Gall stone    GERD (gastroesophageal reflux disease)    Headache    migraines, stress headaches   History of kidney stones    Hypertension    Hyperthyroidism    during pregnancy   Pneumonia    PONV (postoperative nausea and vomiting)    Recurrent upper respiratory infection (URI)    Sleep apnea    can't use Cpap    Assessment: Patient Reported Symptoms:  Cognitive Cognitive Status: Able to follow simple commands, Alert and oriented to person, place, and time, Normal speech and language skills Cognitive/Intellectual Conditions Management [RPT]: None reported or documented in medical history or problem list      Neurological Neurological Review of Symptoms: Other: [place] Neurological Symptoms/Conditions [RPT]: Neuropathy. Per chart review, PCP reports pain appears more consistent with fibromyalgia. Patient has been started on Duloxetine  by PCP, has not started medication yet Neurological Management Strategies: Routine screening  HEENT HEENT Symptoms Reported: Not assessed      Cardiovascular Cardiovascular Symptoms Reported: Swelling in legs or feet (BLE edema, L>R. She reports she has not required fluid pill but may take one today) Does patient have uncontrolled Hypertension?: Yes Is patient checking Blood Pressure at home?: No Cardiovascular Management Strategies: Medical device, Medication  therapy, Weight management  Respiratory Respiratory Symptoms Reported: Shortness of breath Other Respiratory Symptoms: Chronic shortness of breath at baseline per patient Respiratory Management Strategies: Adequate rest, Coping strategies  Endocrine Endocrine Symptoms Reported: Not assessed Is patient diabetic?: No    Gastrointestinal Gastrointestinal Symptoms Reported: Abdominal pain or discomfort Additional Gastrointestinal Details: Patient had ED visit since previous CMRN visit for umbilical pain and bruising after lifting heavy objects. She was discharged after provider indicated symptoms related to hematoma. Patient had follow-up with PCP yesterday. Per chart review, PCP indicates hematoma seems to be resolving. She reports bruising is decreased but pain continues. She has contacted her surgeon's office as well and spoke with triage nurse to try and get appointment. Patient notes she has been using Tylenol  and heating pad as advised for relief. Gastrointestinal Management Strategies: Medication therapy    Genitourinary Genitourinary Symptoms Reported: Not assessed    Integumentary Integumentary Symptoms Reported: Other Other Integumentary Symptoms: Patient reports abscess on L breast is no longer present, but she does still have tenderness there. She did have mammogram done recently for further evaluation. Skin Management Strategies: Routine screening  Musculoskeletal Musculoskelatal Symptoms Reviewed: Limited mobility, Unsteady gait, Other Other Musculoskeletal Symptoms: Chronic generalized pain Musculoskeletal Management Strategies: Medical device Musculoskeletal Comment: Patient denies falls since previous CMRN visit Falls in the past year?: Yes Number of falls in past year: 2 or more Was there an injury with Fall?: No Fall Risk Category Calculator: 2 Patient Fall Risk Level: Moderate Fall Risk Patient at Risk for  Falls Due to: History of fall(s), Impaired balance/gait, Impaired  mobility Fall risk Follow up: Falls evaluation completed, Education provided  Psychosocial Psychosocial Symptoms Reported: Not assessed          03/05/2024    PHQ2-9 Depression Screening   Little interest or pleasure in doing things    Feeling down, depressed, or hopeless    PHQ-2 - Total Score    Trouble falling or staying asleep, or sleeping too much    Feeling tired or having little energy    Poor appetite or overeating     Feeling bad about yourself - or that you are a failure or have let yourself or your family down    Trouble concentrating on things, such as reading the newspaper or watching television    Moving or speaking so slowly that other people could have noticed.  Or the opposite - being so fidgety or restless that you have been moving around a lot more than usual    Thoughts that you would be better off dead, or hurting yourself in some way    PHQ2-9 Total Score    If you checked off any problems, how difficult have these problems made it for you to do your work, take care of things at home, or get along with other people    Depression Interventions/Treatment      There were no vitals filed for this visit.  Medications Reviewed Today     Reviewed by Arno Rosaline SQUIBB, RN (Registered Nurse) on 03/05/24 at 1200  Med List Status: <None>   Medication Order Taking? Sig Documenting Provider Last Dose Status Informant  albuterol  (PROVENTIL ) (2.5 MG/3ML) 0.083% nebulizer solution [id number]  Take 3 mLs (2.5 mg total) by nebulization every 4 (four) hours as needed. USE 1 VIAL VIA NEBULIZER EVERY 4 TO 6 HOURS AS NEEDED FOR COUGH OR WHEEZING Kozlow, Camellia PARAS, MD  Active   albuterol  (VENTOLIN  HFA) 108 (90 Base) MCG/ACT inhaler [id number]  Inhale 2 puffs into the lungs every 4 (four) hours as needed. Kozlow, Eric J, MD  Active   ASHWAGANDHA GUMMIES PO [id number]  Take 1 capsule by mouth as needed. [provider]  Active   benzonatate  (TESSALON ) 200 MG capsule [id number]   Take 200 mg by mouth 2 (two) times daily as needed for cough. [provider]  Active   Clindamycin  Phos, Once-Daily, (CLINDAGEL) 1 % GEL [id number]  Apply 1 application  topically 2 (two) times daily as needed (boils). Tharon Lung, MD  Active   clonazePAM  (KLONOPIN ) 1 MG tablet [id number]  Take 1 mg by mouth daily. [provider]  Active   cyclobenzaprine  (FLEXERIL ) 10 MG tablet [id number]  Take 1 tablet (10 mg total) by mouth 3 (three) times daily as needed for muscle spasms. Rosalynn Camie CROME, MD  Active   diclofenac  Sodium (VOLTAREN ) 1 % GEL [id number]  Apply 4 g topically 4 (four) times daily. Austin Ade, MD  Active   dicyclomine (BENTYL) 10 MG capsule [id number]  Take 10 mg by mouth every 6 (six) hours as needed. [provider]  Active   doxycycline  (VIBRAMYCIN ) 100 MG capsule [id number]  Take 1 capsule (100 mg total) by mouth 2 (two) times daily. Logan Ubaldo NOVAK, PA-C  Active   DULoxetine  (CYMBALTA ) 30 MG capsule [id number]  Take 1 capsule (30 mg total) by mouth daily.  Patient not taking: Reported on 03/04/2024   Rumball, Alison M, DO  Active   EPINEPHrine  (EPIPEN  2-PAK) 0.3 mg/0.3 mL IJ  SOAJ injection [id number]  Inject 0.3 mg into the muscle as needed for anaphylaxis. Kozlow, Eric J, MD  Active   Erythromycin  (AKNE-MYCIN  EX) [id number]  Apply 1 application  topically in the morning, at noon, and at bedtime. Thin layer in affected eyes [provider]  Active   famotidine  (PEPCID ) 20 MG tablet [id number]  Take 1 tablet (20 mg total) by mouth in the morning.  Patient taking differently: Take 1 tablet (20 mg total) by mouth in the morning.   Kozlow, Camellia PARAS, MD  Active            Med Note (Paisleigh Maroney P   Mon Sep 03, 2023 10:34 AM) Patient reports taking as needed.  fluticasone  (FLONASE ) 50 MCG/ACT nasal spray [id number]  Place 1 spray into both nostrils daily. Kozlow, Camellia PARAS, MD  Active            Med Note (Samira Acero P   Tue Oct 16, 2023 11:31 AM)  Patient reports taking as needed  furosemide  (LASIX ) 20 MG tablet [id number]  Take 20 mg daily as needed for leg swelling.  If leg swelling persists, can take additional 20 mg in the evening for total of 40 mg that day.  Patient taking differently: Take 40 mg by mouth daily.   Shitarev, Dimitry, MD  Active   ipratropium (ATROVENT ) 0.06 % nasal spray [id number]  Place 2 sprays into both nostrils 4 (four) times daily. USE 2 SPRAYS INTO EACH NOSTRIL EVERY 6 HOURS AS NEEDED FOR TO DRY UP NOSE. Kozlow, Camellia PARAS, MD  Active   ketoconazole  (NIZORAL ) 2 % cream [id number]  Apply 1 Application topically 2 (two) times daily. Use for two weeks Joshua Domino, DO  Active   ketoconazole  (NIZORAL ) 2 % shampoo [id number]  Apply 1 Application topically 2 (two) times a week. Rosalynn Camie CROME, MD  Active   levocetirizine (XYZAL ) 5 MG tablet [id number]  Take 1 tablet (5 mg total) by mouth daily as needed for allergies (Can take an extra dose during flare ups.).  Patient not taking: Reported on 03/04/2024   Kozlow, Eric J, MD  Active   linaclotide  (LINZESS ) 72 MCG capsule [id number]  Take 2 capsules (144 mcg total) by mouth daily before breakfast. Scarlet Elsie LABOR, MD  Active            Med Note (Marria Mathison P   Mon Sep 03, 2023 10:35 AM) Patient reports taking as needed.  mometasone -formoterol  (DULERA ) 200-5 MCG/ACT AERO [id number]  Inhale 2 puffs into the lungs in the morning and at bedtime. Take twice daily for 1-2 weeks with respiratory illness or asthma flare. Kozlow, Camellia PARAS, MD  Active            Med Note (Adrianah Prophete P   Tue Oct 16, 2023 11:29 AM) Patient reports taking once a day  montelukast  (SINGULAIR ) 10 MG tablet [id number]  Take 1 tablet (10 mg total) by mouth at bedtime. Take one tablet by mouth at bedtime Kozlow, Camellia PARAS, MD  Active            Med Note (Martika Egler P   Tue Oct 16, 2023 11:32 AM) Patient reports taking as needed  Multiple Vitamins-Minerals (MULTIVITAMIN GUMMIES ADULTS PO) [id number]   Take 1 tablet by mouth daily. [provider]  Active   mupirocin  ointment (BACTROBAN ) 2 % [id number]  Apply 1 Application topically 2 (two) times daily. [provider]  Active   NYSTATIN  powder [id number]  Apply topically 3 (  three) times daily. [provider]  Active   Olopatadine  HCl 0.7 % SOLN [id number]  Apply 1 drop to eye daily. [provider]  Active            Med Note (Lucio Litsey P   Tue Oct 16, 2023 11:32 AM) Patient reports taking as needed  Omega-3 Fatty Acids (MEGARED ADVANCED OMEGA-3 PO) [id number]  Take 1 capsule by mouth daily. [provider]  Active   omeprazole  (PRILOSEC) 40 MG capsule [id number]  Take 1 capsule (40 mg total) by mouth daily. Madelon Donald HERO, DO  Active            Med Note (Rajat Staver P   Mon Sep 03, 2023 10:34 AM) Patient reports taking as needed.  Pyrithione Zinc  (DHS ZINC ) 2 % SHAM [id number]  Apply to scalp 3 to 5 times a week as ditreted Rumball, Alison M, DO  Active   Respiratory Therapy Supplies (NEBULIZER MASK ADULT) MISC [id number]  1 Device by Does not apply route as needed. Kozlow, Camellia PARAS, MD  Active   rosuvastatin  (CRESTOR ) 10 MG tablet [id number]  Take 1 tablet (10 mg total) by mouth daily.  Patient not taking: Reported on 03/04/2024   Toma Matas, MD  Active   Spacer/Aero-Holding [facility] [id number]  Use as directed with inhaler. Kozlow, Camellia PARAS, MD  Active   tezepelumab -ekko (TEZSPIRE ) 210 MG/1. syringe 210 mg [id number]   Kozlow, Eric J, MD  Active   TEZSPIRE  210 MG/1. syringe [id number]  INJECT 210 MG UNDER THE SKIN EVERY 4 WEEKS (TO BE ADMINISTERED BY A HEALTHCARE PROVIDER IN A HEALTHCARE SETTING) Kozlow, Camellia PARAS, MD  Active   triamcinolone  (KENALOG ) 0.025 % ointment [id number] No Apply topically 2 (two) times daily. Howell Lunger, DO Taking Active             Recommendation:   Continue Current Plan of Care  Follow Up Plan:   Telephone follow up appointment  date/time:  04/03/24 at 1 PM  [name], RN MSN [place]  [facility] RN Care Manager Direct Dial: [phone]  Fax: [phone]

## 2024-03-07 ENCOUNTER — Ambulatory Visit: Payer: Self-pay | Admitting: Surgery

## 2024-03-07 DIAGNOSIS — Z8719 Personal history of other diseases of the digestive system: Secondary | ICD-10-CM | POA: Diagnosis not present

## 2024-03-07 DIAGNOSIS — R1033 Periumbilical pain: Secondary | ICD-10-CM | POA: Diagnosis not present

## 2024-03-11 ENCOUNTER — Ambulatory Visit: Admitting: Family

## 2024-03-11 ENCOUNTER — Ambulatory Visit: Admitting: Allergy and Immunology

## 2024-03-11 ENCOUNTER — Encounter: Payer: Self-pay | Admitting: Allergy and Immunology

## 2024-03-11 ENCOUNTER — Ambulatory Visit

## 2024-03-11 ENCOUNTER — Other Ambulatory Visit: Payer: Self-pay

## 2024-03-11 VITALS — BP 118/82 | HR 64 | Temp 98.3°F | Resp 20 | Ht 63.0 in | Wt 317.0 lb

## 2024-03-11 DIAGNOSIS — M2559 Pain in other specified joint: Secondary | ICD-10-CM | POA: Diagnosis not present

## 2024-03-11 DIAGNOSIS — B999 Unspecified infectious disease: Secondary | ICD-10-CM | POA: Diagnosis not present

## 2024-03-11 DIAGNOSIS — Z862 Personal history of diseases of the blood and blood-forming organs and certain disorders involving the immune mechanism: Secondary | ICD-10-CM | POA: Diagnosis not present

## 2024-03-11 DIAGNOSIS — J4550 Severe persistent asthma, uncomplicated: Secondary | ICD-10-CM | POA: Diagnosis not present

## 2024-03-11 DIAGNOSIS — K219 Gastro-esophageal reflux disease without esophagitis: Secondary | ICD-10-CM | POA: Diagnosis not present

## 2024-03-11 DIAGNOSIS — J3089 Other allergic rhinitis: Secondary | ICD-10-CM

## 2024-03-11 MED ORDER — DULERA 200-5 MCG/ACT IN AERO
2.0000 | INHALATION_SPRAY | Freq: Two times a day (BID) | RESPIRATORY_TRACT | 1 refills | Status: AC
Start: 2024-03-11 — End: ?

## 2024-03-11 MED ORDER — MONTELUKAST SODIUM 10 MG PO TABS: 10.0000 mg | ORAL_TABLET | Freq: Every evening | ORAL | 1 refills | Status: AC

## 2024-03-11 MED ORDER — FLUTICASONE PROPIONATE 50 MCG/ACT NA SUSP: 1.0000 | Freq: Every day | NASAL | 1 refills | Status: AC

## 2024-03-11 NOTE — Progress Notes (Unsigned)
 [place] - [place] - [place] - [place] - [place]   Follow-up Note  Referring Provider: Madelon Donald HERO, DO Primary Provider: Madelon Donald HERO, DO Date of Office Visit: 03/11/2024  Subjective:   Kelsey Bullock (DOB: [birth year]) is a 63 y.o. female who [facility] in re-evaluation of the following:  HPI: Manju returns to this clinic in evaluation of asthma, allergic rhinitis, LPR, history of diastolic dysfunction.  I last saw her in this clinic 11 September 2023.  She continues to do relatively well regarding her lower airway and has not required a systemic steroid to treat an exacerbation and her use of a short acting bronchodilators less than once a week while she continues on tezepelumab  and occasional Dulera .  She continues to do relatively well regarding her upper airway and has not required an antibiotic to treat an episode of sinusitis while she continues on intermittent use of Flonase  and consistent use of montelukast .  Her reflux appears to be under good control with the therapy prescribed by GI as long as she uses that therapy.  She has been having joint pain.  This difficult to say exactly how long is been going on but it may be up to a year or so.  Involves predominately her lower back and her knees but she does have problems with her shoulders and problems with her hands.  It does not sound as though she has ever seen a rheumatologist.  Allergies as of 03/11/2024       Reactions   Amoxicillin Anaphylaxis, Swelling   Etodolac Anaphylaxis, Shortness Of Breath   lips swelled  lips swelled lips swelled,  lips swelled   Penicillins Anaphylaxis, Swelling, Rash   DID THE REACTION INVOLVE: Swelling of the face/tongue/throat, SOB, or low BP? Yes Sudden or severe rash/hives, skin peeling, or the inside of the mouth or nose? No Did it require medical treatment? No When did it last happen?      20 years ago If all above  answers are "NO", may proceed with cephalosporin use.   Latex Rash, Hives   Pantoprazole Sodium Hives   unspecified REACTION: unspecified unspecified, REACTION: unspecified   Wound Dressing Adhesive Rash   Ampicillin Swelling   Augmentin [amoxicillin-pot Clavulanate] Swelling   Diclofenac  Sodium    Unknown reaction    Pantoprazole Sodium    unspecified   Adhesive [tape] Rash        Medication List    AKNE-MYCIN  EX Apply 1 application  topically in the morning, at noon, and at bedtime. Thin layer in affected eyes   albuterol  108 (90 Base) MCG/ACT inhaler Commonly known as: VENTOLIN  HFA Inhale 2 puffs into the lungs every 4 (four) hours as needed.   albuterol  (2.5 MG/3ML) 0.083% nebulizer solution Commonly known as: PROVENTIL  Take 3 mLs (2.5 mg total) by nebulization every 4 (four) hours as needed. USE 1 VIAL VIA NEBULIZER EVERY 4 TO 6 HOURS AS NEEDED FOR COUGH OR WHEEZING   ASHWAGANDHA GUMMIES PO Take 1 capsule by mouth as needed.   benzonatate  200 MG capsule Commonly known as: TESSALON  Take 200 mg by mouth 2 (two) times daily as needed for cough.   Clindamycin  Phos (Once-Daily) 1 % Gel Commonly known as: Clindagel Apply 1 application  topically 2 (two) times daily as needed (boils).   clonazePAM  1 MG tablet Commonly known as: KLONOPIN  Take 1 mg by mouth daily.   cyclobenzaprine  10 MG tablet Commonly known as: FLEXERIL   Take 1 tablet (10 mg total) by mouth 3 (three) times daily as needed for muscle spasms.   DHS Zinc  2 % Sham Generic drug: Pyrithione Zinc  Apply to scalp 3 to 5 times a week as ditreted   diclofenac  Sodium 1 % Gel Commonly known as: VOLTAREN  Apply 4 g topically 4 (four) times daily.   dicyclomine 10 MG capsule Commonly known as: BENTYL Take 10 mg by mouth every 6 (six) hours as needed.   doxycycline  100 MG capsule Commonly known as: VIBRAMYCIN  Take 1 capsule (100 mg total) by mouth 2 (two) times daily.   Dulera  200-5 MCG/ACT  Aero Generic drug: mometasone -formoterol  Inhale 2 puffs into the lungs in the morning and at bedtime. Take twice daily for 1-2 weeks with respiratory illness or asthma flare.   DULoxetine  30 MG capsule Commonly known as: Cymbalta  Take 1 capsule (30 mg total) by mouth daily.   EPINEPHrine  0.3 mg/0.3 mL Soaj injection Commonly known as: EpiPen  2-Pak Inject 0.3 mg into the muscle as needed for anaphylaxis.   famotidine  20 MG tablet Commonly known as: PEPCID  Take 1 tablet (20 mg total) by mouth in the morning.   fluticasone  50 MCG/ACT nasal spray Commonly known as: FLONASE  Place 1 spray into both nostrils daily.   furosemide  20 MG tablet Commonly known as: LASIX  Take 20 mg daily as needed for leg swelling.  If leg swelling persists, can take additional 20 mg in the evening for total of 40 mg that day.   ipratropium 0.06 % nasal spray Commonly known as: ATROVENT  Place 2 sprays into both nostrils 4 (four) times daily. USE 2 SPRAYS INTO EACH NOSTRIL EVERY 6 HOURS AS NEEDED FOR TO DRY UP NOSE.   ketoconazole  2 % shampoo Commonly known as: NIZORAL  Apply 1 Application topically 2 (two) times a week.   ketoconazole  2 % cream Commonly known as: NIZORAL  Apply 1 Application topically 2 (two) times daily. Use for two weeks   levocetirizine 5 MG tablet Commonly known as: XYZAL  Take 1 tablet (5 mg total) by mouth daily as needed for allergies (Can take an extra dose during flare ups.).   linaclotide  72 MCG capsule Commonly known as: LINZESS  Take 2 capsules (144 mcg total) by mouth daily before breakfast.   MEGARED ADVANCED OMEGA-3 PO Take 1 capsule by mouth daily.   montelukast  10 MG tablet Commonly known as: SINGULAIR  Take 1 tablet (10 mg total) by mouth at bedtime. Take one tablet by mouth at bedtime   MULTIVITAMIN GUMMIES ADULTS PO Take 1 tablet by mouth daily.   mupirocin  ointment 2 % Commonly known as: BACTROBAN  Apply 1 Application topically 2 (two) times daily.    Nebulizer Mask Adult Misc 1 Device by Does not apply route as needed.   nystatin  powder Generic drug: nystatin  Apply topically 3 (three) times daily.   Olopatadine  HCl 0.7 % Soln Apply 1 drop to eye daily.   omeprazole  40 MG capsule Commonly known as: PRILOSEC Take 1 capsule (40 mg total) by mouth daily.   rosuvastatin  10 MG tablet Commonly known as: Crestor  Take 1 tablet (10 mg total) by mouth daily.   Spacer/Aero-Holding [employer] Use as directed with inhaler.   Tezspire  210 MG/1. syringe Generic drug: tezepelumab -ekko INJECT 210 MG UNDER THE SKIN EVERY 4 WEEKS (TO BE ADMINISTERED BY A HEALTHCARE PROVIDER IN A HEALTHCARE SETTING)   triamcinolone  0.025 % ointment Commonly known as: KENALOG  Apply topically 2 (two) times daily.    Past Medical History:  Diagnosis Date  Allergic rhinitis    Anemia    Anxiety    Arthritis    knees and back   Asthma    Asthma 11/26/2006   Qualifier: Diagnosis of   By: Scarlet MD, Elsie         Complication of anesthesia    Congestive heart failure (CHF) (HCC)    Depression    Gall stone    GERD (gastroesophageal reflux disease)    Headache    migraines, stress headaches   History of kidney stones    Hypertension    Hyperthyroidism    during pregnancy   Pneumonia    PONV (postoperative nausea and vomiting)    Recurrent upper respiratory infection (URI)    Sleep apnea    can't use Cpap    Past Surgical History:  Procedure Laterality Date   ADENOIDECTOMY     APPLICATION OF WOUND VAC  03/18/2019   APPLICATION OF WOUND VAC N/A 03/18/2019   Procedure: Application Of Wound Vac;  Surgeon: Belinda Cough, MD;  Location: [facility] OR;  Service: General;  Laterality: N/A;   BIOPSY  05/27/2018   Procedure: BIOPSY;  Surgeon: Shila Gustav GAILS, MD;  Location: [facility];  Service: Endoscopy;;   CESAREAN SECTION     COLONOSCOPY WITH PROPOFOL  N/A 05/27/2018   Procedure: COLONOSCOPY WITH PROPOFOL ;  Surgeon: Shila Gustav GAILS,  MD;  Location: [facility];  Service: Endoscopy;  Laterality: N/A;   CYST EXCISION     EXPLORATORY LAPAROTOMY  02/03/2019   INGUINAL HERNIA REPAIR N/A 02/03/2019   Procedure: EXPLORATORY LAPAROTOMY WITH SMALL BOWEL RESECTION AND PRIMARY CLOSURE OF VENTRAL HERNIA;  Surgeon: Belinda Cough, MD;  Location: [facility] OR;  Service: General;  Laterality: N/A;   INSERTION OF MESH N/A 11/25/2020   Procedure: INSERTION OF MESH;  Surgeon: Belinda Cough, MD;  Location: [facility] OR;  Service: General;  Laterality: N/A;   OVARIAN CYST REMOVAL     POLYPECTOMY  05/27/2018   Procedure: POLYPECTOMY;  Surgeon: Shila Gustav GAILS, MD;  Location: [facility];  Service: Endoscopy;;   removal of gallstones     TONSILLECTOMY     TYMPANOSTOMY TUBE PLACEMENT     VENTRAL HERNIA REPAIR N/A 11/25/2020   Procedure: RECURRENT VENTRAL HERNIA REPAIR WITH MESH;  Surgeon: Belinda Cough, MD;  Location: [facility] OR;  Service: General;  Laterality: N/A;   WOUND EXPLORATION  03/18/2019   abdominal   WOUND EXPLORATION N/A 03/18/2019   Procedure: ABDOMINAL WOUND EXPLORATION;  Surgeon: Belinda Cough, MD;  Location: [facility] OR;  Service: General;  Laterality: N/A;    Review of systems negative except as noted in HPI / PMHx or noted below:  Review of Systems  Constitutional: Negative.   HENT: Negative.    Eyes: Negative.   Respiratory: Negative.    Cardiovascular: Negative.   Gastrointestinal: Negative.   Genitourinary: Negative.   Musculoskeletal: Negative.   Skin: Negative.   Neurological: Negative.   Endo/Heme/Allergies: Negative.   Psychiatric/Behavioral: Negative.       Objective:   Vitals:   03/11/24 1114  BP: 118/82  Pulse: 64  Resp: 20  Temp: 98.3 F (36.8 C)  SpO2: 96%   Height: 5' 3 (160 cm)  Weight: (!) 317 lb (143.8 kg)   Physical Exam Constitutional:      Appearance: She is not diaphoretic.  HENT:     Head: Normocephalic.     Right Ear: Tympanic membrane, ear canal and external ear normal.     Left Ear:  Tympanic membrane, ear canal  and external ear normal.     Nose: Nose normal. No mucosal edema or rhinorrhea.     Mouth/Throat:     Pharynx: Uvula midline. No oropharyngeal exudate.  Eyes:     Conjunctiva/sclera: Conjunctivae normal.  Neck:     Thyroid : No thyromegaly.     Trachea: Trachea normal. No tracheal tenderness or tracheal deviation.  Cardiovascular:     Rate and Rhythm: Normal rate and regular rhythm.     Heart sounds: Normal heart sounds, S1 normal and S2 normal. No murmur heard. Pulmonary:     Effort: No respiratory distress.     Breath sounds: Normal breath sounds. No stridor. No wheezing or rales.  Lymphadenopathy:     Head:     Right side of head: No tonsillar adenopathy.     Left side of head: No tonsillar adenopathy.     Cervical: No cervical adenopathy.  Skin:    Findings: No erythema or rash.     Nails: There is no clubbing.  Neurological:     Mental Status: She is alert.     Diagnostics: Spirometry was performed and demonstrated an FEV1 of 1.65 at 70 % of predicted.  Assessment and Plan:   1. Pain in other joint   2. Asthma, severe persistent, well-controlled (HCC)   3. Other allergic rhinitis   4. LPRD (laryngopharyngeal reflux disease)     1. Continue to treat and prevent inflammation of airway:  A. Tezepelumab  injections every 4 weeks B. Dulera  200 - 2 inhalations 1-2 times per day  C. Flonase  - 1 spray each nostril 1-2 times per day  D. Montelukast  10 mg daily  2.  If needed:   A.  Albuterol  HFA - 2 inhalations every 4-6 hours  B.  Antihistamine   3. Continue treatment for reflux as directed by GI  4. Evaluation of joint pain: ANA w/r, CCP, RF, Sed, CRP  5. Return to clinic in 6 months or earlier if problem.   6. Influenza = Tamiflu . Covid = Paxlovid    Todd is doing okay on her current therapy directed against her airway on her current plan.  She uses the tezepelumab  injection as her major controller agent and sometimes uses the other  agents and a plan appears to be working pretty well.  And her reflux is doing well with therapy prescribed by GI.  She has been having arthralgia and is difficult to say exactly when this started but it is enough for her to bring up the issue today and we will screen her for a rheumatologic form of arthralgia with the blood test noted above.  We always need to be cognizant that she may be having some problems with her joints because of the tezepelumab  although her arthralgia may have started before the use of this medication.  I will contact her with the results of her blood test once they are available for review.    Camellia Denis, MD Allergy / Immunology [facility]

## 2024-03-11 NOTE — Patient Instructions (Addendum)
  1. Continue to treat and prevent inflammation of airway:  A. Tezepelumab  injections every 4 weeks B. Dulera  200 - 2 inhalations 1-2 times per day  C. Flonase  - 1 spray each nostril 1-2 times per day  D. Montelukast  10 mg daily  2.  If needed:   A.  Albuterol  HFA - 2 inhalations every 4-6 hours  B.  Antihistamine   3. Continue treatment for reflux as directed by GI  4. Evaluation of joint pain: ANA w/r, CCP, RF, Sed, CRP  5. Return to clinic in 6 months or earlier if problem.   6. Influenza = Tamiflu . Covid = Paxlovid

## 2024-03-12 ENCOUNTER — Encounter: Payer: Self-pay | Admitting: Allergy and Immunology

## 2024-03-12 ENCOUNTER — Other Ambulatory Visit

## 2024-03-12 ENCOUNTER — Ambulatory Visit: Admitting: Neurology

## 2024-03-12 ENCOUNTER — Encounter: Payer: Self-pay | Admitting: Neurology

## 2024-03-12 VITALS — BP 154/93 | HR 75 | Ht 63.0 in | Wt 328.0 lb

## 2024-03-12 DIAGNOSIS — M79672 Pain in left foot: Secondary | ICD-10-CM | POA: Diagnosis not present

## 2024-03-12 DIAGNOSIS — M79671 Pain in right foot: Secondary | ICD-10-CM

## 2024-03-12 DIAGNOSIS — G8929 Other chronic pain: Secondary | ICD-10-CM

## 2024-03-12 NOTE — Progress Notes (Signed)
 [facility] Neurology Division Clinic Note - Initial Visit   Date: 03/12/2024   Kelsey Bullock MRN: [id number] DOB: 12-Mar-1961   Dear Dr. Madelon:  Thank you for your kind referral of Kelsey Bullock for consultation of numbness/tingling. Although her history is well known to you, please allow us  to reiterate it for the purpose of our medical record. The patient was accompanied to the clinic by self.    Kelsey Bullock is a 64 y.o. female presenting for evaluation of bilateral feet pain.   IMPRESSION/PLAN: Bilateral feet pain and paresthesias.  Distal sensation, reflexes, and strength is intact which makes neuropathy less likely.  NCS/EMG would localize symptoms better, however after discussing how the testing is performed, patient opted not to proceed.  I think she would not be able to tolerate it due to pain, as she has tenderness and pain from light pressure during manual motor testing and reflex testing.  I did explain that even if she have neuropathy, there is no treatment and management is supportive.  To be complete, I will check vitamin B12, folate, copper, SPEP with IFE.  She is taking duloxetine  30mg  for fibromyalgia which can also help with nerve pain.   ------------------------------------------------------------- History of present illness: Starting around 2016, she began having numbness and shocks in the feet.  Since then, she has developed generalized pain in the arms, legs, knees, and joints.  She also has spells of generalized numbness.  No exacerbating or alleviating factors.  She is not diabetic and does not drink alcohol.  She previusly weighed 490lb and lost 200lb, however, regained 100lb. She is taking nutritional supplements.   Out-side paper records, electronic medical record, and images have been reviewed where available and summarized as:  Lab Results  Component Value Date   HGBA1C 5.8 (H) 09/13/2023   Lab Results  Component Value Date    VITAMINB12 514 10/17/2021   Lab Results  Component Value Date   TSH 0.954 09/13/2023   Lab Results  Component Value Date   ESRSEDRATE 7 03/11/2024    Past Medical History:  Diagnosis Date   Allergic rhinitis    Anemia    Anxiety    Arthritis    knees and back   Asthma    Asthma 11/26/2006   Qualifier: Diagnosis of   By: Scarlet MD, Elsie         Complication of anesthesia    Congestive heart failure (CHF) (HCC)    Depression    Gall stone    GERD (gastroesophageal reflux disease)    Headache    migraines, stress headaches   History of kidney stones    Hypertension    Hyperthyroidism    during pregnancy   Pneumonia    PONV (postoperative nausea and vomiting)    Recurrent upper respiratory infection (URI)    Sleep apnea    can't use Cpap    Past Surgical History:  Procedure Laterality Date   ADENOIDECTOMY     APPLICATION OF WOUND VAC  03/18/2019   APPLICATION OF WOUND VAC N/A 03/18/2019   Procedure: Application Of Wound Vac;  Surgeon: Belinda Cough, MD;  Location: [facility] OR;  Service: General;  Laterality: N/A;   BIOPSY  05/27/2018   Procedure: BIOPSY;  Surgeon: Shila Gustav GAILS, MD;  Location: [facility];  Service: Endoscopy;;   CESAREAN SECTION     COLONOSCOPY WITH PROPOFOL  N/A 05/27/2018   Procedure: COLONOSCOPY WITH PROPOFOL ;  Surgeon: Shila Gustav GAILS, MD;  Location: [facility];  Service: Endoscopy;  Laterality: N/A;   CYST EXCISION     EXPLORATORY LAPAROTOMY  02/03/2019   INGUINAL HERNIA REPAIR N/A 02/03/2019   Procedure: EXPLORATORY LAPAROTOMY WITH SMALL BOWEL RESECTION AND PRIMARY CLOSURE OF VENTRAL HERNIA;  Surgeon: Belinda Cough, MD;  Location: [facility] OR;  Service: General;  Laterality: N/A;   INSERTION OF MESH N/A 11/25/2020   Procedure: INSERTION OF MESH;  Surgeon: Belinda Cough, MD;  Location: [facility] OR;  Service: General;  Laterality: N/A;   OVARIAN CYST REMOVAL     POLYPECTOMY  05/27/2018   Procedure: POLYPECTOMY;  Surgeon: Shila Gustav GAILS,  MD;  Location: [facility];  Service: Endoscopy;;   removal of gallstones     TONSILLECTOMY     TYMPANOSTOMY TUBE PLACEMENT     VENTRAL HERNIA REPAIR N/A 11/25/2020   Procedure: RECURRENT VENTRAL HERNIA REPAIR WITH MESH;  Surgeon: Belinda Cough, MD;  Location: [facility] OR;  Service: General;  Laterality: N/A;   WOUND EXPLORATION  03/18/2019   abdominal   WOUND EXPLORATION N/A 03/18/2019   Procedure: ABDOMINAL WOUND EXPLORATION;  Surgeon: Belinda Cough, MD;  Location: [facility] OR;  Service: General;  Laterality: N/A;     Medications:  Outpatient Encounter Medications as of 03/12/2024  Medication Sig Note   albuterol  (PROVENTIL ) (2.5 MG/3ML) 0.083% nebulizer solution Take 3 mLs (2.5 mg total) by nebulization every 4 (four) hours as needed. USE 1 VIAL VIA NEBULIZER EVERY 4 TO 6 HOURS AS NEEDED FOR COUGH OR WHEEZING    albuterol  (VENTOLIN  HFA) 108 (90 Base) MCG/ACT inhaler Inhale 2 puffs into the lungs every 4 (four) hours as needed.    Ascorbic Acid  (VITAMIN C ) 100 MG tablet Take 100 mg by mouth daily.    ASHWAGANDHA GUMMIES PO Take 1 capsule by mouth as needed.    benzonatate  (TESSALON ) 200 MG capsule Take 200 mg by mouth 2 (two) times daily as needed for cough.    Clindamycin  Phos, Once-Daily, (CLINDAGEL) 1 % GEL Apply 1 application  topically 2 (two) times daily as needed (boils).    clonazePAM  (KLONOPIN ) 1 MG tablet Take 1 mg by mouth daily.    Cyanocobalamin  (B-12 SUPER STRENGTH SL) Place under the tongue.    cyclobenzaprine  (FLEXERIL ) 10 MG tablet Take 1 tablet (10 mg total) by mouth 3 (three) times daily as needed for muscle spasms.    dicyclomine (BENTYL) 10 MG capsule Take 10 mg by mouth every 6 (six) hours as needed.    doxycycline  (VIBRAMYCIN ) 100 MG capsule Take 1 capsule (100 mg total) by mouth 2 (two) times daily.    DULoxetine  (CYMBALTA ) 30 MG capsule Take 1 capsule (30 mg total) by mouth daily.    EPINEPHrine  (EPIPEN  2-PAK) 0.3 mg/0.3 mL IJ SOAJ injection Inject 0.3 mg into the muscle  as needed for anaphylaxis.    famotidine  (PEPCID ) 20 MG tablet Take 1 tablet (20 mg total) by mouth in the morning. 09/03/2023: Patient reports taking as needed.   fluticasone  (FLONASE ) 50 MCG/ACT nasal spray Place 1 spray into both nostrils daily.    furosemide  (LASIX ) 20 MG tablet Take 20 mg daily as needed for leg swelling.  If leg swelling persists, can take additional 20 mg in the evening for total of 40 mg that day. (Patient taking differently: Take 40 mg by mouth daily. Take 20 mg daily as needed for leg swelling.  If leg swelling persists, can take additional 20 mg in the evening for total of 40 mg that day.)    levocetirizine (XYZAL ) 5 MG  tablet Take 1 tablet (5 mg total) by mouth daily as needed for allergies (Can take an extra dose during flare ups.).    linaclotide  (LINZESS ) 72 MCG capsule Take 2 capsules (144 mcg total) by mouth daily before breakfast. (Patient taking differently: Take 145 mcg by mouth daily before breakfast.) 09/03/2023: Patient reports taking as needed.   mometasone -formoterol  (DULERA ) 200-5 MCG/ACT AERO Inhale 2 puffs into the lungs in the morning and at bedtime. Take twice daily for 1-2 weeks with respiratory illness or asthma flare.    montelukast  (SINGULAIR ) 10 MG tablet Take 1 tablet (10 mg total) by mouth at bedtime. Take one tablet by mouth at bedtime    Multiple Vitamins-Minerals (MULTIVITAMIN GUMMIES ADULTS PO) Take 1 tablet by mouth daily.    mupirocin  ointment (BACTROBAN ) 2 % Apply 1 Application topically 2 (two) times daily.    NON FORMULARY Mushroom Complex    omeprazole  (PRILOSEC) 40 MG capsule Take 1 capsule (40 mg total) by mouth daily. 09/03/2023: Patient reports taking as needed.   oxycodone  (OXY-IR) 5 MG capsule Take 5 mg by mouth every 4 (four) hours as needed.    Probiotic Product (PROBIOTIC BLEND PO) Take by mouth.    rosuvastatin  (CRESTOR ) 10 MG tablet Take 1 tablet (10 mg total) by mouth daily.    Spacer/Aero-Holding Raguel FRENCH Use as directed  with inhaler.    tiZANidine  (ZANAFLEX ) 4 MG capsule Take 4 mg by mouth 3 (three) times daily.    traMADol  (ULTRAM ) 50 MG tablet Take by mouth every 6 (six) hours as needed.    Turmeric (QC TUMERIC COMPLEX PO) Take by mouth.    diclofenac  Sodium (VOLTAREN ) 1 % GEL Apply 4 g topically 4 (four) times daily. (Patient not taking: Reported on 03/12/2024)    Erythromycin  (AKNE-MYCIN  EX) Apply 1 application  topically in the morning, at noon, and at bedtime. Thin layer in affected eyes (Patient not taking: Reported on 03/12/2024)    ipratropium (ATROVENT ) 0.06 % nasal spray Place 2 sprays into both nostrils 4 (four) times daily. USE 2 SPRAYS INTO EACH NOSTRIL EVERY 6 HOURS AS NEEDED FOR TO DRY UP NOSE. (Patient not taking: Reported on 03/12/2024)    ketoconazole  (NIZORAL ) 2 % cream Apply 1 Application topically 2 (two) times daily. Use for two weeks (Patient not taking: Reported on 03/12/2024)    ketoconazole  (NIZORAL ) 2 % shampoo Apply 1 Application topically 2 (two) times a week. (Patient not taking: Reported on 03/12/2024)    NYSTATIN  powder Apply topically 3 (three) times daily. (Patient not taking: Reported on 03/12/2024)    Olopatadine  HCl 0.7 % SOLN Apply 1 drop to eye daily. (Patient not taking: Reported on 03/12/2024) 10/16/2023: Patient reports taking as needed   Omega-3 Fatty Acids (MEGARED ADVANCED OMEGA-3 PO) Take 1 capsule by mouth daily. (Patient not taking: Reported on 03/12/2024)    Pyrithione Zinc  (DHS ZINC ) 2 % SHAM Apply to scalp 3 to 5 times a week as ditreted (Patient not taking: Reported on 03/12/2024)    Respiratory Therapy Supplies (NEBULIZER MASK ADULT) MISC 1 Device by Does not apply route as needed. (Patient not taking: Reported on 03/12/2024)    TEZSPIRE  210 MG/1. syringe INJECT 210 MG UNDER THE SKIN EVERY 4 WEEKS (TO BE ADMINISTERED BY A HEALTHCARE PROVIDER IN A HEALTHCARE SETTING) (Patient not taking: Reported on 03/12/2024)    triamcinolone  (KENALOG ) 0.025 % ointment Apply topically 2  (two) times daily. (Patient not taking: Reported on 03/12/2024)    Facility-Administered Encounter Medications as of 03/12/2024  Medication   tezepelumab -ekko (TEZSPIRE ) 210 MG/1. syringe 210 mg    Allergies:  Allergies  Allergen Reactions   Amoxicillin Anaphylaxis and Swelling   Etodolac Anaphylaxis and Shortness Of Breath    lips swelled   lips swelled  lips swelled,  lips swelled   Penicillins Anaphylaxis, Swelling and Rash    DID THE REACTION INVOLVE: Swelling of the face/tongue/throat, SOB, or low BP? Yes Sudden or severe rash/hives, skin peeling, or the inside of the mouth or nose? No Did it require medical treatment? No When did it last happen?      20 years ago If all above answers are "NO", may proceed with cephalosporin use.    Latex Rash and Hives   Pantoprazole Sodium Hives    unspecified  REACTION: unspecified  unspecified, REACTION: unspecified   Wound Dressing Adhesive Rash   Ampicillin Swelling   Augmentin [Amoxicillin-Pot Clavulanate] Swelling   Diclofenac  Sodium     Unknown reaction    Pantoprazole Sodium     unspecified   Adhesive [Tape] Rash    Family History: Family History  Adopted: Yes  Problem Relation Age of Onset   Diabetes Mother    Diabetes Paternal Uncle    Allergic rhinitis Neg Hx    Angioedema Neg Hx    Asthma Neg Hx    Atopy Neg Hx    Eczema Neg Hx    Immunodeficiency Neg Hx    Urticaria Neg Hx    Breast cancer Neg Hx     Social History: Social History   Tobacco Use   Smoking status: Never    Passive exposure: Never   Smokeless tobacco: Never  Vaping Use   Vaping status: Never Used  Substance Use Topics   Alcohol use: No   Drug use: No   Social History   Social History Narrative   Are you right handed or left handed? Born left handed but trained right hand   Are you currently employed ? No    What is your current occupation?   Do you live at home alone? No    Who lives with you? Son    What type of home do  you live in: 1 story or 2 story? Lives in a one story apartment         Vital Signs:  BP (!) 154/93   Pulse 75   Ht 5' 3 (1.6 m)   Wt (!) 328 lb (148.8 kg)   LMP 02/03/2019 Comment: Tubal Ligation  SpO2 100%   BMI 58.10 kg/m     Neurological Exam: MENTAL STATUS including orientation to time, place, person, recent and remote memory, attention span and concentration, language, and fund of knowledge is normal.  Speech is not dysarthric.  CRANIAL NERVES: II:  No visual field defects.     III-IV-VI: Pupils equal round and reactive to light.  Normal conjugate, extra-ocular eye movements in all directions of gaze.  No nystagmus.  No ptosis.   V:  Normal facial sensation.    VII:  Normal facial symmetry and movements.   VIII:  Normal hearing and vestibular function.   IX-X:  Normal palatal movement.   XI:  Normal shoulder shrug and head rotation.   XII:  Normal tongue strength and range of motion, no deviation or fasciculation.  MOTOR:  No atrophy, fasciculations or abnormal movements.  No pronator drift. Diffuse muscle tenderness when testing for motor strength.   Upper Extremity:  Right  Left  Deltoid  5/5  5/5   Biceps  5/5   5/5   Triceps  5/5   5/5   Wrist extensors  5/5   5/5   Wrist flexors  5/5   5/5   Finger extensors  5/5   5/5   Finger flexors  5/5   5/5   Dorsal interossei  5/5   5/5   Abductor pollicis  5/5   5/5   Tone (Ashworth scale)  0  0   Lower Extremity:  Right  Left  Hip flexors  5/5   5/5   Knee flexors  5/5   5/5   Knee extensors  5/5   5/5   Dorsiflexors  5/5   5/5   Plantarflexors  5/5   5/5   Toe extensors  5/5   5/5   Toe flexors  5/5   5/5   Tone (Ashworth scale)  0  0   MSRs:                                           Right        Left brachioradialis 2+  2+  biceps 2+  2+  triceps 2+  2+  patellar 2+  2+  ankle jerk 2+  2+  Hoffman no  no  plantar response down  down   SENSORY:  Normal and symmetric perception of light touch,  pinprick, vibration.   COORDINATION/GAIT: Normal finger-to- nose-finger.  Intact rapid alternating movements bilaterally. Gait is wide-based, stable.     Thank you for allowing me to participate in patient's care.  If I can answer any additional questions, I would be pleased to do so.    Sincerely,    Orie Baxendale K. Tobie, DO

## 2024-03-12 NOTE — Patient Instructions (Signed)
 Check labs

## 2024-03-13 LAB — ANA W/REFLEX: Anti Nuclear Antibody (ANA): NEGATIVE

## 2024-03-13 LAB — C-REACTIVE PROTEIN: CRP: 6 mg/L (ref 0–10)

## 2024-03-13 LAB — RHEUMATOID FACTOR: Rheumatoid fact SerPl-aCnc: 16.2 [IU]/mL — ABNORMAL HIGH (ref <14.0)

## 2024-03-13 LAB — CYCLIC CITRUL PEPTIDE ANTIBODY, IGG/IGA: Cyclic Citrullin Peptide Ab: 10 U (ref 0–19)

## 2024-03-13 LAB — SEDIMENTATION RATE: Sed Rate: 7 mm/h (ref 0–40)

## 2024-03-14 NOTE — Addendum Note (Signed)
 Addended by: AZALEA, Nixon Sparr on: 03/14/2024 03:08 PM   Modules accepted: Orders

## 2024-03-19 ENCOUNTER — Ambulatory Visit: Payer: Self-pay | Admitting: Neurology

## 2024-03-19 LAB — B12 AND FOLATE PANEL
Folate: 12.3 ng/mL
Vitamin B-12: 258 pg/mL (ref 200–1100)

## 2024-03-19 LAB — PROTEIN ELECTROPHORESIS, SERUM
Albumin ELP: 4.1 g/dL (ref 3.8–4.8)
Alpha 1: 0.3 g/dL (ref 0.2–0.3)
Alpha 2: 0.8 g/dL (ref 0.5–0.9)
Beta 2: 0.4 g/dL (ref 0.2–0.5)
Beta Globulin: 0.5 g/dL (ref 0.4–0.6)
Gamma Globulin: 1.1 g/dL (ref 0.8–1.7)
Total Protein: 7.1 g/dL (ref 6.1–8.1)

## 2024-03-19 LAB — IMMUNOFIXATION ELECTROPHORESIS
IgG (Immunoglobin G), Serum: 1293 mg/dL (ref 600–1540)
IgM, Serum: 147 mg/dL (ref 50–300)
Immunoglobulin A: 157 mg/dL (ref 70–320)

## 2024-03-19 LAB — COPPER, SERUM: Copper: 119 ug/dL (ref 70–175)

## 2024-03-21 NOTE — Progress Notes (Unsigned)
   SUBJECTIVE:   CHIEF COMPLAINT / HPI:   Discussed the use of AI scribe software for clinical note transcription with the patient, who gave verbal consent to proceed.  History of Present Illness Kelsey Bullock is a 63 year old female with neuropathy/fibromyalgia who presents for followup.  Neuropathy/fibromyalgia - mainly in L leg but all over. Feels on fire with redness and heat, not much swelling. Removing sock provides some relief. - saw Neuro 10/8, didn't feel she could undergo EMG testing due to pain. Labs only notable for B12 low normal.  - felt to be more fibromyalgia, started duloxetine  last visit - started duloxetine  last visit 1 month ago. Maybe takes 2-3 days of the week.  - has taken some B12 supplementation - organizational challenges at home, including a cluttered closet and misplaced prescriptions, known inconsistent use of prescription medication, had prior visit with pharmacy, canceled upcoming appt   OBJECTIVE:   BP (!) 141/86   Pulse 74   Wt (!) 322 lb (146.1 kg)   LMP 02/03/2019 Comment: Tubal Ligation  SpO2 97%   BMI 57.04 kg/m   Gen: well appearing, in NAD Card: Reg rate Lungs: Comfortable WOB on RA Ext: WWP, no edema. Minimal b/l erythema to lower legs, bilateral. Exquisitely TTP diffusely.  ASSESSMENT/PLAN:   Neuropathy More likely fibromyalgia than true neuropathy. Work on daily consistent use of duloxetine . F/u 1 month, plan to recheck B12 levels and symptoms, expect to likely increase duloxetine  at that time. Work to increase physical activity.   Polypharmacy With some organizational and financial difficulties. Encouraged to make pharmacist f/u appt soon. Pill box supplied today.    Donald CHRISTELLA Lai, DO

## 2024-03-24 ENCOUNTER — Ambulatory Visit (INDEPENDENT_AMBULATORY_CARE_PROVIDER_SITE_OTHER): Admitting: Family Medicine

## 2024-03-24 ENCOUNTER — Encounter: Payer: Self-pay | Admitting: Family Medicine

## 2024-03-24 VITALS — BP 141/86 | HR 74 | Wt 322.0 lb

## 2024-03-24 DIAGNOSIS — G629 Polyneuropathy, unspecified: Secondary | ICD-10-CM

## 2024-03-24 DIAGNOSIS — M797 Fibromyalgia: Secondary | ICD-10-CM | POA: Diagnosis not present

## 2024-03-24 DIAGNOSIS — Z79899 Other long term (current) drug therapy: Secondary | ICD-10-CM

## 2024-03-24 NOTE — Assessment & Plan Note (Addendum)
 More likely fibromyalgia than true neuropathy. Work on daily consistent use of duloxetine . F/u 1 month, plan to recheck B12 levels and symptoms, expect to likely increase duloxetine  at that time. Work to increase physical activity.

## 2024-03-24 NOTE — Patient Instructions (Signed)
 It was great to see you!  Our plans for today:  - Try to take the duloxetine  every day to get the most benefit. - Check your schedule and make an appointment with Dr. Koval.  - Come back in 1 month for recheck and labs.  Take care and seek immediate care sooner if you develop any concerns.   Dr. Avaley Coop

## 2024-03-24 NOTE — Assessment & Plan Note (Addendum)
 With some organizational and financial difficulties. Encouraged to make pharmacist f/u appt soon. Pill box supplied today.

## 2024-04-03 ENCOUNTER — Other Ambulatory Visit: Payer: Self-pay

## 2024-04-03 ENCOUNTER — Telehealth: Payer: Self-pay | Admitting: Family Medicine

## 2024-04-03 DIAGNOSIS — M1712 Unilateral primary osteoarthritis, left knee: Secondary | ICD-10-CM

## 2024-04-03 DIAGNOSIS — M545 Low back pain, unspecified: Secondary | ICD-10-CM

## 2024-04-03 NOTE — Patient Outreach (Signed)
 Complex Care Management   Visit Note  04/03/2024  Name:  Kelsey Bullock MRN: [id number] DOB: 24-Aug-1960  Situation: Referral received for Complex Care Management related to Heart Failure and SDOH Barriers:  Transportation Food insecurity I obtained verbal consent from Patient.  Visit completed with Patient  on the phone  Background:   Past Medical History:  Diagnosis Date   Allergic rhinitis    Anemia    Anxiety    Arthritis    knees and back   Asthma    Asthma 11/26/2006   Qualifier: Diagnosis of   By: Scarlet MD, Elsie         Complication of anesthesia    Congestive heart failure (CHF) (HCC)    Depression    Gall stone    GERD (gastroesophageal reflux disease)    Headache    migraines, stress headaches   History of kidney stones    Hypertension    Hyperthyroidism    during pregnancy   Pneumonia    PONV (postoperative nausea and vomiting)    Recurrent upper respiratory infection (URI)    Sleep apnea    can't use Cpap    Assessment: Patient Reported Symptoms:  Cognitive Cognitive Status: Able to follow simple commands, Alert and oriented to person, place, and time, Normal speech and language skills Cognitive/Intellectual Conditions Management [RPT]: None reported or documented in medical history or problem list   Health Maintenance Behaviors: Annual physical exam Health Facilitated by: Pain control, Rest  Neurological Neurological Review of Symptoms: Other: [place] Neurological Symptoms/Conditions [RPT]: Pain consistent with fibromyalgia, patient reports consistence with Duloxetine  some days. Advised compliance with medication daily as prescribed Neurological Management Strategies: Medication therapy, Routine screening Neurological Comment: Note per chart review patient was found to have low-end of normal B12 upon assessment by neurology. She reports she has started B12 supplement as advised by neurology  HEENT HEENT Symptoms Reported: No symptoms  reported HEENT Management Strategies: Routine screening    Cardiovascular Cardiovascular Symptoms Reported: Swelling in legs or feet (BLE swelling at baseline per patient. Patient reports she has not taken her fluid pill recently) Does patient have uncontrolled Hypertension?: Yes Is patient checking Blood Pressure at home?: No Cardiovascular Management Strategies: Medical device, Medication therapy, Weight management Do You Have a Working Readable Scale?: Yes  Respiratory Respiratory Symptoms Reported: Shortness of breath, Other: Other Respiratory Symptoms: Chronic shortness of breath, stopped up per patient due to weather changes Additional Respiratory Details: Reminded patient of upcoming Tezpire injection 04/08/24 Respiratory Management Strategies: Adequate rest, Medication therapy, Coping strategies  Endocrine Endocrine Symptoms Reported: No symptoms reported Is patient diabetic?: No    Gastrointestinal Gastrointestinal Symptoms Reported: Abdominal pain or discomfort, Constipation, Diarrhea Additional Gastrointestinal Details: Patient reports she continues to experience umbilical pain. She has been seen by general surgery, who advised no sign of recurrent hernia. Patient reports she is alternating between diarrhea and constipation. Gastrointestinal Management Strategies: Medication therapy    Genitourinary Genitourinary Symptoms Reported: No symptoms reported    Integumentary Integumentary Symptoms Reported: No symptoms reported    Musculoskeletal Musculoskelatal Symptoms Reviewed: Limited mobility, Unsteady gait, Other Other Musculoskeletal Symptoms: Chronic generalized pain Musculoskeletal Management Strategies: Medical device Musculoskeletal Comment: Patient denies falls since previous CMRN visit. Patient is requesting an order for a shower chair. Message sent to PCP Falls in the past year?: Yes Number of falls in past year: 2 or more Was there an injury with Fall?: No Fall Risk  Category Calculator: 2 Patient Fall Risk Level: Moderate Fall  Risk Patient at Risk for Falls Due to: History of fall(s), Impaired balance/gait, Impaired mobility Fall risk Follow up: Falls evaluation completed, Education provided, Falls prevention discussed  Psychosocial Psychosocial Symptoms Reported: Other Other Psychosocial Conditions: Stress related to financial resources and medical appointments for herself and her son. Emotional support provided. Advised patient to visit [email or web address]/, [employer], or visit food pantry at PCP office. Behavioral Management Strategies: Coping strategies, Counseling Major Change/Loss/Stressor/Fears (CP): Medical condition, self, Resources Techniques to [facility] with Loss/Stress/Change: Spiritual practice(s), Diversional activities      04/03/2024    PHQ2-9 Depression Screening   Little interest or pleasure in doing things    Feeling down, depressed, or hopeless    PHQ-2 - Total Score    Trouble falling or staying asleep, or sleeping too much    Feeling tired or having little energy    Poor appetite or overeating     Feeling bad about yourself - or that you are a failure or have let yourself or your family down    Trouble concentrating on things, such as reading the newspaper or watching television    Moving or speaking so slowly that other people could have noticed.  Or the opposite - being so fidgety or restless that you have been moving around a lot more than usual    Thoughts that you would be better off dead, or hurting yourself in some way    PHQ2-9 Total Score    If you checked off any problems, how difficult have these problems made it for you to do your work, take care of things at home, or get along with other people    Depression Interventions/Treatment      There were no vitals filed for this visit.  Medications Reviewed Today     Reviewed by Arno Rosaline SQUIBB, RN (Registered Nurse) on 04/03/24 at 1313  Med List Status: <None>    Medication Order Taking? Sig Documenting Provider Last Dose Status Informant  albuterol  (PROVENTIL ) (2.5 MG/3ML) 0.083% nebulizer solution [id number]  Take 3 mLs (2.5 mg total) by nebulization every 4 (four) hours as needed. USE 1 VIAL VIA NEBULIZER EVERY 4 TO 6 HOURS AS NEEDED FOR COUGH OR WHEEZING Kozlow, Camellia PARAS, MD  Active   albuterol  (VENTOLIN  HFA) 108 (90 Base) MCG/ACT inhaler [id number]  Inhale 2 puffs into the lungs every 4 (four) hours as needed. Kozlow, Camellia PARAS, MD  Active   Ascorbic Acid  (VITAMIN C ) 100 MG tablet [id number]  Take 100 mg by mouth daily. [provider]  Active   ASHWAGANDHA GUMMIES PO [id number]  Take 1 capsule by mouth as needed. [provider]  Active   benzonatate  (TESSALON ) 200 MG capsule [id number]  Take 200 mg by mouth 2 (two) times daily as needed for cough. [provider]  Active   Clindamycin  Phos, Once-Daily, (CLINDAGEL) 1 % GEL [id number]  Apply 1 application  topically 2 (two) times daily as needed (boils). Tharon Lung, MD  Active   clonazePAM  (KLONOPIN ) 1 MG tablet [id number]  Take 1 mg by mouth daily. [provider]  Active   Cyanocobalamin  (B-12 SUPER BroadwayARIZONA [id number] Yes Place under the tongue. [provider]  Active   cyclobenzaprine  (FLEXERIL ) 10 MG tablet [id number]  Take 1 tablet (10 mg total) by mouth 3 (three) times daily as needed for muscle spasms. Rosalynn Camie CROME, MD  Active   diclofenac  Sodium (VOLTAREN ) 1 % GEL [id number]  Apply 4 g topically 4 (four) times daily.  Patient  not taking: Reported on 03/12/2024   Austin Ade, MD  Active   dicyclomine (BENTYL) 10 MG capsule [id number]  Take 10 mg by mouth every 6 (six) hours as needed. [provider]  Active   doxycycline  (VIBRAMYCIN ) 100 MG capsule [id number]  Take 1 capsule (100 mg total) by mouth 2 (two) times daily. Logan Ubaldo NOVAK, PA-C  Active   DULoxetine  (CYMBALTA ) 30 MG capsule [id number]  Take 1 capsule (30 mg total) by mouth daily.  Rumball, Alison M, DO  Active   EPINEPHrine  (EPIPEN  2-PAK) 0.3 mg/0.3 mL IJ SOAJ injection [id number]  Inject 0.3 mg into the muscle as needed for anaphylaxis. Kozlow, Camellia PARAS, MD  Active   Erythromycin  (AKNE-MYCIN  EX) [id number]  Apply 1 application  topically in the morning, at noon, and at bedtime. Thin layer in affected eyes  Patient not taking: Reported on 03/12/2024   [provider]  Active   famotidine  (PEPCID ) 20 MG tablet [id number]  Take 1 tablet (20 mg total) by mouth in the morning. Kozlow, Camellia PARAS, MD  Active            Med Note (Cassadie Pankonin P   Mon Sep 03, 2023 10:34 AM) Patient reports taking as needed.  fluticasone  (FLONASE ) 50 MCG/ACT nasal spray [id number]  Place 1 spray into both nostrils daily. Kozlow, Eric J, MD  Active   furosemide  (LASIX ) 20 MG tablet [id number]  Take 20 mg daily as needed for leg swelling.  If leg swelling persists, can take additional 20 mg in the evening for total of 40 mg that day.  Patient taking differently: Take 40 mg by mouth daily. Take 20 mg daily as needed for leg swelling.  If leg swelling persists, can take additional 20 mg in the evening for total of 40 mg that day.   Shitarev, Dimitry, MD  Active   ipratropium (ATROVENT ) 0.06 % nasal spray [id number]  Place 2 sprays into both nostrils 4 (four) times daily. USE 2 SPRAYS INTO EACH NOSTRIL EVERY 6 HOURS AS NEEDED FOR TO DRY UP NOSE.  Patient not taking: Reported on 03/12/2024   Kozlow, Eric J, MD  Active   ketoconazole  (NIZORAL ) 2 % cream [id number]  Apply 1 Application topically 2 (two) times daily. Use for two weeks  Patient not taking: Reported on 03/12/2024   Joshua Domino, DO  Active   ketoconazole  (NIZORAL ) 2 % shampoo [id number]  Apply 1 Application topically 2 (two) times a week.  Patient not taking: Reported on 03/12/2024   Rosalynn Camie CROME, MD  Active   levocetirizine (XYZAL ) 5 MG tablet [id number]  Take 1 tablet (5 mg total) by mouth daily as needed for allergies (Can take an extra dose  during flare ups.). Kozlow, Camellia PARAS, MD  Active   linaclotide  (LINZESS ) 72 MCG capsule [id number]  Take 2 capsules (144 mcg total) by mouth daily before breakfast.  Patient taking differently: Take 145 mcg by mouth daily before breakfast.   Scarlet Elsie LABOR, MD  Active            Med Note (Treson Laura P   Mon Sep 03, 2023 10:35 AM) Patient reports taking as needed.  mometasone -formoterol  (DULERA ) 200-5 MCG/ACT AERO [id number]  Inhale 2 puffs into the lungs in the morning and at bedtime. Take twice daily for 1-2 weeks with respiratory illness or asthma flare. Kozlow, Camellia PARAS, MD  Active   montelukast  (SINGULAIR ) 10 MG tablet [id number]  Take 1 tablet (10 mg total) by mouth at bedtime.  Take one tablet by mouth at bedtime Kozlow, Eric J, MD  Active   Multiple Vitamins-Minerals (MULTIVITAMIN GUMMIES ADULTS PO) [id number]  Take 1 tablet by mouth daily. [provider]  Active   mupirocin  ointment (BACTROBAN ) 2 % [id number]  Apply 1 Application topically 2 (two) times daily. [provider]  Active   JESSE SCHLOSSMAN [id number]  Mushroom Complex [provider]  Active   NYSTATIN  powder [id number]  Apply topically 3 (three) times daily.  Patient not taking: Reported on 03/12/2024   [provider]  Active   Olopatadine  HCl 0.7 % SOLN [id number]  Apply 1 drop to eye daily.  Patient not taking: Reported on 03/12/2024   [provider]  Active            Med Note (Kanden Carey P   Tue Oct 16, 2023 11:32 AM) Patient reports taking as needed  Omega-3 Fatty Acids (MEGARED ADVANCED OMEGA-3 PO) [id number]  Take 1 capsule by mouth daily.  Patient not taking: Reported on 03/12/2024   [provider]  Active   omeprazole  (PRILOSEC) 40 MG capsule [id number]  Take 1 capsule (40 mg total) by mouth daily. Madelon Donald HERO, DO  Active            Med Note (Tajanae Guilbault P   Mon Sep 03, 2023 10:34 AM) Patient reports taking as needed.  oxycodone  (OXY-IR) 5 MG  capsule [id number]  Take 5 mg by mouth every 4 (four) hours as needed. [provider]  Active   Probiotic Product (PROBIOTIC BLEND PO) [id number]  Take by mouth. [provider]  Active   Pyrithione Zinc  (DHS ZINC ) 2 % SHAM [id number]  Apply to scalp 3 to 5 times a week as ditreted  Patient not taking: Reported on 03/12/2024   Rumball, Alison M, DO  Active   Respiratory Therapy Supplies (NEBULIZER MASK ADULT) MISC [id number]  1 Device by Does not apply route as needed.  Patient not taking: Reported on 03/12/2024   Kozlow, Eric J, MD  Active   rosuvastatin  (CRESTOR ) 10 MG tablet [id number]  Take 1 tablet (10 mg total) by mouth daily. Toma Matas, MD  Active   Spacer/Aero-Holding Raguel FRENCH [id number]  Use as directed with inhaler. Kozlow, Camellia PARAS, MD  Active   tezepelumab -ekko (TEZSPIRE ) 210 MG/1. syringe 210 mg [id number]   Kozlow, Eric J, MD  Active   TEZSPIRE  210 MG/1. syringe [id number]  INJECT 210 MG UNDER THE SKIN EVERY 4 WEEKS (TO BE ADMINISTERED BY A HEALTHCARE PROVIDER IN A HEALTHCARE SETTING)  Patient not taking: Reported on 03/12/2024   Kozlow, Eric J, MD  Active   tiZANidine  (ZANAFLEX ) 4 MG capsule [id number]  Take 4 mg by mouth 3 (three) times daily. [provider]  Active   traMADol  (ULTRAM ) 50 MG tablet [id number]  Take by mouth every 6 (six) hours as needed. [provider]  Active   triamcinolone  (KENALOG ) 0.025 % ointment [id number]  Apply topically 2 (two) times daily.  Patient not taking: Reported on 03/12/2024   Howell Lunger, DO  Active   Turmeric (QC TUMERIC COMPLEX PO) [id number]  Take by mouth. [provider]  Active             Recommendation:   Continue Current Plan of Care  Follow Up Plan:   Telephone follow up appointment date/time:  05/02/24 at 1 PM  [name], RN MSN [place]  [facility] RN Care Manager Direct Dial: [phone]  Fax: [phone]

## 2024-04-03 NOTE — Telephone Encounter (Signed)
-----   Message from Nurse Rosaline RAMAN sent at 04/03/2024  1:52 PM EDT ----- Regarding: Patient DME Request Good afternoon Dr. Madelon,  I had RN Care Management follow-up with patient this afternoon. She is requesting a shower chair, as the bench she currently has does not fit in her shower. Would you be ok ordering one or would you prefer to wait until your upcoming appointment 04/23/24 to discuss further?  Thank you, [name], RN MSN [place]  [facility] RN Care Manager Direct Dial: [phone]  Fax: [phone]

## 2024-04-03 NOTE — Patient Instructions (Signed)
 Visit Information  [name] was given information about Medicaid Managed Care team care coordination services as a part of their [facility] Community Plan Medicaid benefit.   If you would like to schedule transportation through your [facility], please call the following number at least 2 days in advance of your appointment: [phone]   Rides for urgent appointments can also be made after hours by calling Member Services.  Call the Behavioral Health Crisis Line at [phone], at any time, 24 hours a day, 7 days a week. If you are in danger or need immediate medical attention call 911.   Care plan and visit instructions communicated with the patient verbally today. Patient agrees to receive a copy in MyChart. Active MyChart status and patient understanding of how to access instructions and care plan via MyChart confirmed with patient.     Telephone follow up appointment with Managed Medicaid care management team member scheduled for: 05/02/24 at 1 PM  [name], RN MSN   [facility] RN Care Manager Direct Dial: [phone]  Fax: [phone]   Following is a copy of your plan of care:  There are no care plans that you recently modified to display for this patient.

## 2024-04-07 ENCOUNTER — Ambulatory Visit: Admitting: Pharmacist

## 2024-04-08 ENCOUNTER — Ambulatory Visit

## 2024-04-08 DIAGNOSIS — J4550 Severe persistent asthma, uncomplicated: Secondary | ICD-10-CM

## 2024-04-10 ENCOUNTER — Ambulatory Visit: Payer: Self-pay | Admitting: Allergy and Immunology

## 2024-04-22 NOTE — Progress Notes (Unsigned)
   SUBJECTIVE:   CHIEF COMPLAINT / HPI:   Dizziness - no known trigger, happens at rest sometimes. Room is spinning. Worse with changing positions or moving head. Some nausea. Thinks maybe some chest pain with it. No SOB. No trouble speaking, walking more than usual. Ongoing for a few weeks.  Fibromyalgia/?Neuropathy - was taking B12 supplement regularly, hasn't taken in a few weeks. Taking duloxetine  a few days per week. Has not yet received shower chair. Allergist ordered autoimmune workup, RF mildly elevated but otherwise largely unremarkable.  Social - has not scheduled with pharmacy yet. SW has reached out but left VM. Still with transportation and financial difficulties.   OBJECTIVE:   BP (!) 141/72   Pulse 70   Ht 5' 3 (1.6 m)   Wt (!) 323 lb (146.5 kg)   LMP 02/03/2019 Comment: Tubal Ligation  SpO2 98%   BMI 57.22 kg/m   Gen: well appearing, in NAD Card: RRR Lungs: CTAB Ext: WWP, no pitting edema MSK: Tone and bulk normal. Full ROM, strength 5/5 to U/LE bilaterally, normal gait.  No edema.  Neuro: Alert and oriented, speech normal.  Extraocular movements intact with reproduction of symptoms.  Intact symmetric sensation to light touch of face and extremities bilaterally.  Hearing grossly intact bilaterally.  Tongue protrudes normally with no deviation.  Shoulder shrug, smile symmetric.   ASSESSMENT/PLAN:   Hypertension Elevated today, didn't take meds. Sent BP cuff to [facility]. Recommend monitor at home and f/u 1 month for recheck.   Neuropathy More likely fibromyalgia than true neuropathy. Work on daily consistent use of duloxetine  and increasing physical activity. Checking B12 levels today. Ordered DME shower chair.   Financial difficulties Previous [facility] referral placed, gave number to call SW. Encouraged pharmacy f/u to assist with medication needs.   Hypercholesterolemia Not on statin. Lipid panel today.   Vertigo Unable to perform dix hallpike today but  with classic symptoms and reproduction of symptoms with testing of extraocular movements, will treat as vertigo. Counseled on epley maneuver at home given transportation difficulties. Patient will call if remains symptomatic and plan to refer to vestibular rehab at that time.    Donald CHRISTELLA Lai, DO

## 2024-04-23 ENCOUNTER — Encounter: Payer: Self-pay | Admitting: Family Medicine

## 2024-04-23 ENCOUNTER — Ambulatory Visit (INDEPENDENT_AMBULATORY_CARE_PROVIDER_SITE_OTHER): Admitting: Family Medicine

## 2024-04-23 VITALS — BP 141/72 | HR 70 | Ht 63.0 in | Wt 323.0 lb

## 2024-04-23 DIAGNOSIS — E669 Obesity, unspecified: Secondary | ICD-10-CM

## 2024-04-23 DIAGNOSIS — Z79899 Other long term (current) drug therapy: Secondary | ICD-10-CM | POA: Diagnosis not present

## 2024-04-23 DIAGNOSIS — E785 Hyperlipidemia, unspecified: Secondary | ICD-10-CM

## 2024-04-23 DIAGNOSIS — G8929 Other chronic pain: Secondary | ICD-10-CM | POA: Diagnosis not present

## 2024-04-23 DIAGNOSIS — R42 Dizziness and giddiness: Secondary | ICD-10-CM | POA: Insufficient documentation

## 2024-04-23 DIAGNOSIS — M797 Fibromyalgia: Secondary | ICD-10-CM

## 2024-04-23 DIAGNOSIS — I10 Essential (primary) hypertension: Secondary | ICD-10-CM

## 2024-04-23 DIAGNOSIS — Z23 Encounter for immunization: Secondary | ICD-10-CM

## 2024-04-23 DIAGNOSIS — E538 Deficiency of other specified B group vitamins: Secondary | ICD-10-CM

## 2024-04-23 DIAGNOSIS — E7800 Pure hypercholesterolemia, unspecified: Secondary | ICD-10-CM | POA: Diagnosis not present

## 2024-04-23 DIAGNOSIS — M545 Low back pain, unspecified: Secondary | ICD-10-CM

## 2024-04-23 DIAGNOSIS — G629 Polyneuropathy, unspecified: Secondary | ICD-10-CM

## 2024-04-23 DIAGNOSIS — Z599 Problem related to housing and economic circumstances, unspecified: Secondary | ICD-10-CM

## 2024-04-23 MED ORDER — BLOOD PRESSURE CUFF MISC: 1.0000 | Freq: Every day | 0 refills | Status: AC

## 2024-04-23 NOTE — Assessment & Plan Note (Signed)
 Previous VBCI referral placed, gave number to call SW. Encouraged pharmacy f/u to assist with medication needs.

## 2024-04-23 NOTE — Assessment & Plan Note (Addendum)
 More likely fibromyalgia than true neuropathy. Work on daily consistent use of duloxetine  and increasing physical activity. Checking B12 levels today. Ordered DME shower chair.

## 2024-04-23 NOTE — Assessment & Plan Note (Signed)
 Unable to perform dix hallpike today but with classic symptoms and reproduction of symptoms with testing of extraocular movements, will treat as vertigo. Counseled on epley maneuver at home given transportation difficulties. Patient will call if remains symptomatic and plan to refer to vestibular rehab at that time.

## 2024-04-23 NOTE — Assessment & Plan Note (Signed)
 Not on statin. Lipid panel today.

## 2024-04-23 NOTE — Patient Instructions (Addendum)
 It was great to see you!  Our plans for today:  - Call Rosaline with Social Work at [phone] for financial and transportation resources.  - Take your shower chair prescription to a local medical supply store.  - Options include: Haematologist on [address], [facility] on [address], [employer] on [address] - Try the Epley maneuver below for your dizziness. Let me know if you are still having trouble and I will refer to vestibular therapy.  - Consider getting your shingles vaccine at your pharmacy.  - Monitor your blood pressure at home and keep a log of your readings. Make sure to be seated for at least 5 minutes prior to testing and not in pain or worked up for the most accurate readings. Bring this log with you to follow up.   We are checking some labs today, we will release these results to your MyChart.  Take care and seek immediate care sooner if you develop any concerns.   Dr. Chasten Blaze   How to Perform the Epley Maneuver The Epley maneuver is an exercise that relieves symptoms of vertigo. Vertigo is the feeling that you or your surroundings are moving when they are not. When you feel vertigo, you may feel like the room is spinning and may have trouble walking. The Epley maneuver is used for a type of vertigo caused by a calcium  deposit in a part of the inner ear. The maneuver involves changing head positions to help the deposit move out of the area. You can do this maneuver at home whenever you have symptoms of vertigo. You can repeat it in 24 hours if your vertigo has not gone away. Even though the Epley maneuver may relieve your vertigo for a few weeks, it is possible that your symptoms will return. This maneuver relieves vertigo, but it does not relieve dizziness. What are the risks? If it is done correctly, the Epley maneuver is considered safe. Sometimes it can lead to dizziness or nausea that goes away after a short time. If you develop other  symptoms--such as changes in vision, weakness, or numbness--stop doing the maneuver and call your health care provider. Supplies needed: A bed or table. A pillow. How to do the Epley maneuver     Sit on the edge of a bed or table with your back straight and your legs extended or hanging over the edge of the bed or table. Turn your head halfway toward the affected ear or side as told by your health care provider. Lie backward quickly with your head turned until you are lying flat on your back. Your head should dangle (head-hanging position). You may want to position a pillow under your shoulders. Hold this position for at least 30 seconds. If you feel dizzy or have symptoms of vertigo, continue to hold the position until the symptoms stop. Turn your head to the opposite direction until your unaffected ear is facing down. Your head should continue to dangle. Hold this position for at least 30 seconds. If you feel dizzy or have symptoms of vertigo, continue to hold the position until the symptoms stop. Turn your whole body to the same side as your head so that you are positioned on your side. Your head will now be nearly facedown and no longer needs to dangle. Hold for at least 30 seconds. If you feel dizzy or have symptoms of vertigo, continue to hold the position until the symptoms stop. Sit back up. You can repeat the maneuver  in 24 hours if your vertigo does not go away. Follow these instructions at home: For 24 hours after doing the Epley maneuver: Keep your head in an upright position. When lying down to sleep or rest, keep your head raised (elevated) with two or more pillows. Avoid excessive neck movements. Activity Do not drive or use machinery if you feel dizzy. After doing the Epley maneuver, return to your normal activities as told by your health care provider. Ask your health care provider what activities are safe for you. General instructions Drink enough fluid to keep your urine  pale yellow. Do not drink alcohol. Take over-the-counter and prescription medicines only as told by your health care provider. Keep all follow-up visits. This is important. Preventing vertigo symptoms Ask your health care provider if there is anything you should do at home to prevent vertigo. He or she may recommend that you: Keep your head elevated with two or more pillows while you sleep. Do not sleep on the side of your affected ear. Get up slowly from bed. Avoid sudden movements during the day. Avoid extreme head positions or movement, such as looking up or bending over. Contact a health care provider if: Your vertigo gets worse. You have other symptoms, including: Nausea. Vomiting. Headache. Get help right away if you: Have vision changes. Have a headache or neck pain that is severe or getting worse. Cannot stop vomiting. Have new numbness or weakness in any part of your body. These symptoms may represent a serious problem that is an emergency. Do not wait to see if the symptoms will go away. Get medical help right away. Call your local emergency services (911 in the [place] Do not drive yourself to the hospital. Summary Vertigo is the feeling that you or your surroundings are moving when they are not. The Epley maneuver is an exercise that relieves symptoms of vertigo. If the Epley maneuver is done correctly, it is considered safe. This information is not intended to replace advice given to you by your health care provider. Make sure you discuss any questions you have with your health care provider. Document Revised: 02/16/2023 Document Reviewed: 02/16/2023 Elsevier Patient Education  2024 [employer].

## 2024-04-23 NOTE — Assessment & Plan Note (Signed)
 Elevated today, didn't take meds. Sent BP cuff to [employer]. Recommend monitor at home and f/u 1 month for recheck.

## 2024-04-24 ENCOUNTER — Ambulatory Visit: Payer: Self-pay | Admitting: Family Medicine

## 2024-04-24 LAB — LIPID PANEL
Chol/HDL Ratio: 4.7 ratio — ABNORMAL HIGH (ref 0.0–4.4)
Cholesterol, Total: 228 mg/dL — ABNORMAL HIGH (ref 100–199)
HDL: 49 mg/dL (ref >39)
LDL Chol Calc (NIH): 142 mg/dL — ABNORMAL HIGH (ref 0–99)
Triglycerides: 205 mg/dL — ABNORMAL HIGH (ref 0–149)
VLDL Cholesterol Cal: 37 mg/dL (ref 5–40)

## 2024-04-24 LAB — VITAMIN B12: Vitamin B-12: 619 pg/mL (ref 232–1245)

## 2024-04-25 ENCOUNTER — Telehealth: Payer: Self-pay

## 2024-04-25 DIAGNOSIS — G629 Polyneuropathy, unspecified: Secondary | ICD-10-CM

## 2024-04-25 DIAGNOSIS — G8929 Other chronic pain: Secondary | ICD-10-CM

## 2024-04-25 DIAGNOSIS — M1712 Unilateral primary osteoarthritis, left knee: Secondary | ICD-10-CM

## 2024-04-25 DIAGNOSIS — E669 Obesity, unspecified: Secondary | ICD-10-CM

## 2024-04-25 NOTE — Telephone Encounter (Signed)
 Patient calls nurse line in regards to DME shower stool.   She reports she took the printed order to several medical supply stores, however they did not have her size.  She reports she will need a stool to accommodate 400+ pounds.  Advised patient we can try to send to [facility].  PCP will need to add to order pound limitations.   Will forward to PCP.

## 2024-04-28 NOTE — Telephone Encounter (Signed)
Community message sent to [employer]. 

## 2024-05-02 ENCOUNTER — Other Ambulatory Visit: Payer: Self-pay

## 2024-05-02 NOTE — Patient Instructions (Signed)
 Visit Information  Kelsey Bullock was given information about Medicaid Managed Care team care coordination services as a part of their [facility] Community Plan Medicaid benefit.   If you would like to schedule transportation through your [facility], please call the following number at least 2 days in advance of your appointment: [phone]   Rides for urgent appointments can also be made after hours by calling Member Services.  Call the Behavioral Health Crisis Line at [phone], at any time, 24 hours a day, 7 days a week. If you are in danger or need immediate medical attention call 911.  Care plan and visit instructions communicated with the patient verbally today. Patient agrees to receive a copy in MyChart. Active MyChart status and patient understanding of how to access instructions and care plan via MyChart confirmed with patient.     Social Worker will contact patient as scheduled 05/08/24 at 3 PM.  Telephone follow up appointment with Managed Medicaid care management team member scheduled for: 05/27/24 at 10 AM  [name], RN MSN [place]  VBCI Population Health RN Care Manager Direct Dial: [phone]  Fax: [phone]   Following is a copy of your care plan:   Goals Addressed             This Visit's Progress    VBCI RN Care Plan   On track    Problems:  Chronic Disease Management support and education needs related to CHF and HTN  Goal: Over the next 30 days the Patient will demonstrate a decrease CHF in exacerbations as evidenced by patient report of no symptoms of fluid overload, decreased use of PRN lasix  demonstrate Improved adherence to prescribed treatment plan for CHF and HTN as evidenced by checking BP and weight daily demonstrate understanding of rationale for each prescribed medication as evidenced by patient report of taking medications as ordered, instead of taking medications as needed     Interventions:    Heart Failure Interventions: Reviewed Heart Failure Action Plan in depth and provided written copy Assessed need for readable accurate scales in home Provided education about placing scale on hard, flat surface Advised patient to weigh each morning after emptying bladder Discussed importance of daily weight and advised patient to weigh and record daily Reviewed role of diuretics in prevention of fluid overload and management of heart failure; Discussed the importance of keeping all appointments with provider Reviewed red flag symptoms and when to contact PCP   Hypertension Interventions: Last practice recorded BP readings:  BP Readings from Last 3 Encounters:  04/23/24 (!) 141/72  03/24/24 (!) 141/86  03/12/24 (!) 154/93   Most recent eGFR/CrCl:  Lab Results  Component Value Date   EGFR 101 09/13/2023    No components found for: CRCL  Reviewed medications with patient and discussed importance of compliance Ensured patient has received BP monitor as ordered by PCP. Advised obtaining batteries and checking BP every 1-2 days, keeping a log of readings Educated patient on goal BP <130/80    Patient Self-Care Activities:  Attend all scheduled provider appointments Call provider office for new concerns or questions  Take medications as prescribed   Work with the social worker to address care coordination needs and will continue to work with the clinical team to address health care and disease management related needs call office if I gain more than 2 pounds in one day or 5 pounds in one week do ankle pumps when sitting keep legs up while sitting watch  for swelling in feet, ankles and legs every day weigh myself daily Get batteries for you BP monitor and begin checking every 1-2 days Reschedule visit with Dr. Amalia  Plan:  Telephone follow up appointment with care management team member scheduled for:  05/27/24 at 10 AM          VBCI RN Care Plan related to financial  constraints   No change    Problems:  Care Coordination needs related to Financial Strain , Housing , and Transportation Difficulty obtaining medications Corporate Treasurer.  Goal: Over the next 30 days the Patient will work with pharmacist to address Medication procurement related to difficulty affording medications as evidenced by review of electronic medical record and patient or pharmacist report    work with child psychotherapist to address Financial constraints related to housing, transportation related to the management of chronic conditions as evidenced by review of electronic medical record and patient or social worker report      Interventions:   Noted per chart review patient cancelled recent visit with Dr. Koval. Ensured she has office phone number and advised to call as soon as possible to reschedule missed visit Noted per chart review patient has not had recent f/u with BSW and PCP has recommended she contact BSW for SDOH needs. Scheduled with BSW 05/08/24 at 3 PM  Patient Self-Care Activities:  Attend all scheduled provider appointments Call provider office for new concerns or questions  Take medications as prescribed   Work with the social worker to address care coordination needs and will continue to work with the clinical team to address health care and disease management related needs Work with the pharmacist to address medication management needs and will continue to work with the clinical team to address health care and disease management related needs  Plan:  Telephone follow up appointment with care management team member scheduled for:  05/27/24 at 10 AM

## 2024-05-02 NOTE — Patient Outreach (Signed)
 Complex Care Management   Visit Note  05/02/2024  Name:  Kelsey Bullock MRN: [id number] DOB: Jul 25, 1960  Situation: Referral received for Complex Care Management related to Heart Failure and HTN I obtained verbal consent from Patient.  Visit completed with Patient  on the phone  Background:   Past Medical History:  Diagnosis Date   Allergic rhinitis    Anemia    Anxiety    Arthritis    knees and back   Asthma    Asthma 11/26/2006   Qualifier: Diagnosis of   By: Scarlet MD, Elsie         Complication of anesthesia    Congestive heart failure (CHF) (HCC)    Depression    Gall stone    GERD (gastroesophageal reflux disease)    Headache    migraines, stress headaches   History of kidney stones    Hypertension    Hyperthyroidism    during pregnancy   Pneumonia    PONV (postoperative nausea and vomiting)    Recurrent upper respiratory infection (URI)    Sleep apnea    can't use Cpap    Assessment: Patient Reported Symptoms:  Cognitive Cognitive Status: Able to follow simple commands, Alert and oriented to person, place, and time, Normal speech and language skills Cognitive/Intellectual Conditions Management [RPT]: None reported or documented in medical history or problem list   Health Maintenance Behaviors: Annual physical exam Health Facilitated by: Pain control, Rest  Neurological Neurological Review of Symptoms: Other: [place] Neurological Symptoms/Conditions [RPT]: Pain consistent with fibromyalgia. Patient reports she continues to only take Duloxetine  some days Neurological Management Strategies: Routine screening, Medication therapy Neurological Comment: Note per chart review patient had recent office visit reporting dizziness, which PCP attributed to vertigo. Patient reports she is unable to perform exercises provided by PCP, but episodes are not frequent.  HEENT HEENT Symptoms Reported: Not assessed      Cardiovascular Cardiovascular Symptoms Reported:  Swelling in legs or feet (Patient reports continued BLE swelling, L>R) Does patient have uncontrolled Hypertension?: Yes Is patient checking Blood Pressure at home?: No Patient's Recent BP reading at home: Patient reports she did get BP monitor ordered by PCP but has not gotten batteries yet. Advised to obtain batteries and educated on goal BP <130/80 Cardiovascular Management Strategies: Medical device, Medication therapy, Weight management Do You Have a Working Readable Scale?: Yes Weight: (!) 324 lb (147 kg) (Patient reported) Cardiovascular Comment: Patient reports compliance with statin for the past several days  Respiratory Respiratory Symptoms Reported: Shortness of breath, Other: Other Respiratory Symptoms: Patient states I've been stopped up more lately. It's that time of year Respiratory Management Strategies: Adequate rest, Coping strategies, Medication therapy, Routine screening  Endocrine Endocrine Symptoms Reported: Not assessed Is patient diabetic?: No    Gastrointestinal Gastrointestinal Symptoms Reported: Constipation Additional Gastrointestinal Details: Patient reports issues with constipation last night, but was able to have a BM last night and this morning. She reports although she has had BMs, she still feels constipated. Patient reports she does have a GI provider that she sees regularly Gastrointestinal Management Strategies: Medication therapy    Genitourinary Genitourinary Symptoms Reported: Not assessed    Integumentary Integumentary Symptoms Reported: No symptoms reported Skin Management Strategies: Routine screening Skin Comment: Patient reports she has an upcoming appointment with dermatology in 05/14/24  Musculoskeletal Musculoskelatal Symptoms Reviewed: Back pain, Difficulty walking, Limited mobility, Other Other Musculoskeletal Symptoms: Chronic generalized pain Musculoskeletal Management Strategies: Medical device, Routine screening Musculoskeletal  Comment: Patient denies falls  since previous CMRN visit. She reports she has not gotten shower chair because medical supply stores did not have any available. Per chart review, order being routed to [facility] Falls in the past year?: Yes Number of falls in past year: 2 or more Was there an injury with Fall?: No Fall Risk Category Calculator: 2 Patient Fall Risk Level: Moderate Fall Risk Patient at Risk for Falls Due to: History of fall(s), Impaired balance/gait, Impaired mobility, Other (Comment) (Vertigo) Fall risk Follow up: Falls evaluation completed, Education provided, Falls prevention discussed  Psychosocial Psychosocial Symptoms Reported: Not assessed          05/02/2024    PHQ2-9 Depression Screening   Little interest or pleasure in doing things    Feeling down, depressed, or hopeless    PHQ-2 - Total Score    Trouble falling or staying asleep, or sleeping too much    Feeling tired or having little energy    Poor appetite or overeating     Feeling bad about yourself - or that you are a failure or have let yourself or your family down    Trouble concentrating on things, such as reading the newspaper or watching television    Moving or speaking so slowly that other people could have noticed.  Or the opposite - being so fidgety or restless that you have been moving around a lot more than usual    Thoughts that you would be better off dead, or hurting yourself in some way    PHQ2-9 Total Score    If you checked off any problems, how difficult have these problems made it for you to do your work, take care of things at home, or get along with other people    Depression Interventions/Treatment      Today's Vitals   05/02/24 1313  Weight: (!) 324 lb (147 kg)   Pain Scale: 0-10 Pain Score: 8  Pain Type: Chronic pain Pain Location: Generalized  Medications Reviewed Today     Reviewed by Arno Rosaline SQUIBB, RN (Registered Nurse) on 05/02/24 at 1333  Med List Status: <None>    Medication Order Taking? Sig Documenting Provider Last Dose Status Informant  albuterol  (PROVENTIL ) (2.5 MG/3ML) 0.083% nebulizer solution [id number]  Take 3 mLs (2.5 mg total) by nebulization every 4 (four) hours as needed. USE 1 VIAL VIA NEBULIZER EVERY 4 TO 6 HOURS AS NEEDED FOR COUGH OR WHEEZING Kozlow, Camellia PARAS, MD  Active   albuterol  (VENTOLIN  HFA) 108 (90 Base) MCG/ACT inhaler [id number]  Inhale 2 puffs into the lungs every 4 (four) hours as needed. Kozlow, Camellia PARAS, MD  Active   Ascorbic Acid  (VITAMIN C ) 100 MG tablet [id number]  Take 100 mg by mouth daily. [provider]  Active   ASHWAGANDHA GUMMIES PO [id number]  Take 1 capsule by mouth as needed. [provider]  Active   benzonatate  (TESSALON ) 200 MG capsule [id number]  Take 200 mg by mouth 2 (two) times daily as needed for cough. [provider]  Active   Blood Pressure Monitoring (BLOOD PRESSURE CUFF) MISC [id number]  1 each by Does not apply route daily. Rumball, Alison M, DO  Active   Clindamycin  Phos, Once-Daily, (CLINDAGEL) 1 % GEL [id number]  Apply 1 application  topically 2 (two) times daily as needed (boils). Tharon Lung, MD  Active   clonazePAM  (KLONOPIN ) 1 MG tablet [id number]  Take 1 mg by mouth daily. [provider]  Active   Cyanocobalamin  (B-12 SUPER [place] SLARIZONA [id number]  Place under the tongue. [provider]  Active   cyclobenzaprine  (FLEXERIL ) 10 MG tablet [id number]  Take 1 tablet (10 mg total) by mouth 3 (three) times daily as needed for muscle spasms. Rosalynn Camie CROME, MD  Active   diclofenac  Sodium (VOLTAREN ) 1 % GEL [id number]  Apply 4 g topically 4 (four) times daily.  Patient not taking: Reported on 03/12/2024   Austin Ade, MD  Active   dicyclomine (BENTYL) 10 MG capsule [id number]  Take 10 mg by mouth every 6 (six) hours as needed. [provider]  Active   doxycycline  (VIBRAMYCIN ) 100 MG capsule [id number]  Take 1 capsule (100 mg total) by mouth 2 (two) times daily.  Logan Ubaldo NOVAK, PA-C  Active   DULoxetine  (CYMBALTA ) 30 MG capsule [id number]  Take 1 capsule (30 mg total) by mouth daily. Rumball, Alison M, DO  Active   EPINEPHrine  (EPIPEN  2-PAK) 0.3 mg/0.3 mL IJ SOAJ injection [id number]  Inject 0.3 mg into the muscle as needed for anaphylaxis. Kozlow, Eric J, MD  Active   Erythromycin  (AKNE-MYCIN  EX) [id number]  Apply 1 application  topically in the morning, at noon, and at bedtime. Thin layer in affected eyes  Patient not taking: Reported on 03/12/2024   [provider]  Active   famotidine  (PEPCID ) 20 MG tablet [id number]  Take 1 tablet (20 mg total) by mouth in the morning. Kozlow, Camellia PARAS, MD  Active            Med Note (Tranquilino Fischler P   Mon Sep 03, 2023 10:34 AM) Patient reports taking as needed.  fluticasone  (FLONASE ) 50 MCG/ACT nasal spray [id number]  Place 1 spray into both nostrils daily. Kozlow, Eric J, MD  Active   furosemide  (LASIX ) 20 MG tablet [id number]  Take 20 mg daily as needed for leg swelling.  If leg swelling persists, can take additional 20 mg in the evening for total of 40 mg that day.  Patient taking differently: Take 40 mg by mouth daily. Take 20 mg daily as needed for leg swelling.  If leg swelling persists, can take additional 20 mg in the evening for total of 40 mg that day.   Shitarev, Dimitry, MD  Active   ipratropium (ATROVENT ) 0.06 % nasal spray [id number]  Place 2 sprays into both nostrils 4 (four) times daily. USE 2 SPRAYS INTO EACH NOSTRIL EVERY 6 HOURS AS NEEDED FOR TO DRY UP NOSE.  Patient not taking: Reported on 03/12/2024   Maurilio Camellia PARAS, MD  Active   ketoconazole  (NIZORAL ) 2 % cream [id number]  Apply 1 Application topically 2 (two) times daily. Use for two weeks  Patient not taking: Reported on 03/12/2024   Joshua Domino, DO  Active   ketoconazole  (NIZORAL ) 2 % shampoo [id number]  Apply 1 Application topically 2 (two) times a week.  Patient not taking: Reported on 03/12/2024   Rosalynn Camie CROME, MD  Active    levocetirizine (XYZAL ) 5 MG tablet [id number]  Take 1 tablet (5 mg total) by mouth daily as needed for allergies (Can take an extra dose during flare ups.). Kozlow, Camellia PARAS, MD  Active   linaclotide  (LINZESS ) 72 MCG capsule [id number]  Take 2 capsules (144 mcg total) by mouth daily before breakfast.  Patient taking differently: Take 145 mcg by mouth daily before breakfast.   Scarlet Elsie LABOR, MD  Active            Med Note (Oney Tatlock P   Mon Sep 03, 2023 10:35 AM) Patient  reports taking as needed.  mometasone -formoterol  (DULERA ) 200-5 MCG/ACT AERO [id number]  Inhale 2 puffs into the lungs in the morning and at bedtime. Take twice daily for 1-2 weeks with respiratory illness or asthma flare. Kozlow, Camellia PARAS, MD  Active   montelukast  (SINGULAIR ) 10 MG tablet [id number]  Take 1 tablet (10 mg total) by mouth at bedtime. Take one tablet by mouth at bedtime Kozlow, Eric J, MD  Active   Multiple Vitamins-Minerals (MULTIVITAMIN GUMMIES ADULTS PO) [id number]  Take 1 tablet by mouth daily. [provider]  Active   mupirocin  ointment (BACTROBAN ) 2 % [id number]  Apply 1 Application topically 2 (two) times daily. [provider]  Active   JESSE SCHLOSSMAN [id number]  Mushroom Complex [provider]  Active   NYSTATIN  powder [id number]  Apply topically 3 (three) times daily.  Patient not taking: Reported on 03/12/2024   [provider]  Active   Olopatadine  HCl 0.7 % SOLN [id number]  Apply 1 drop to eye daily.  Patient not taking: Reported on 03/12/2024   [provider]  Active            Med Note (Jeannine Pennisi P   Tue Oct 16, 2023 11:32 AM) Patient reports taking as needed  Omega-3 Fatty Acids (MEGARED ADVANCED OMEGA-3 PO) [id number]  Take 1 capsule by mouth daily.  Patient not taking: Reported on 03/12/2024   [provider]  Active   omeprazole  (PRILOSEC) 40 MG capsule [id number]  Take 1 capsule (40 mg total) by mouth daily. Madelon Donald HERO, DO   Active            Med Note (Latrail Pounders P   Mon Sep 03, 2023 10:34 AM) Patient reports taking as needed.  oxycodone  (OXY-IR) 5 MG capsule [id number]  Take 5 mg by mouth every 4 (four) hours as needed. [provider]  Active   Probiotic Product (PROBIOTIC BLEND PO) [id number]  Take by mouth. [provider]  Active   Pyrithione Zinc  (DHS ZINC ) 2 % SHAM [id number]  Apply to scalp 3 to 5 times a week as ditreted  Patient not taking: Reported on 03/12/2024   Rumball, Alison M, DO  Active   Respiratory Therapy Supplies (NEBULIZER MASK ADULT) MISC [id number]  1 Device by Does not apply route as needed.  Patient not taking: Reported on 03/12/2024   Kozlow, Eric J, MD  Active   rosuvastatin  (CRESTOR ) 10 MG tablet [id number]  Take 1 tablet (10 mg total) by mouth daily. Toma Matas, MD  Active   Spacer/Aero-Holding Raguel FRENCH [id number]  Use as directed with inhaler. Kozlow, Camellia PARAS, MD  Active   tezepelumab -ekko (TEZSPIRE ) 210 MG/1. syringe 210 mg [id number]   Kozlow, Eric J, MD  Active   TEZSPIRE  210 MG/1. syringe [id number]  INJECT 210 MG UNDER THE SKIN EVERY 4 WEEKS (TO BE ADMINISTERED BY A HEALTHCARE PROVIDER IN A HEALTHCARE SETTING)  Patient not taking: Reported on 03/12/2024   Kozlow, Eric J, MD  Active   tiZANidine  (ZANAFLEX ) 4 MG capsule [id number]  Take 4 mg by mouth 3 (three) times daily. [provider]  Active   traMADol  (ULTRAM ) 50 MG tablet [id number]  Take by mouth every 6 (six) hours as needed. [provider]  Active   triamcinolone  (KENALOG ) 0.025 % ointment [id number]  Apply topically 2 (two) times daily.  Patient not taking: Reported on 03/12/2024   Howell Lunger, DO  Active   Turmeric (QC TUMERIC COMPLEX PO) [id number]  Take by mouth.  [provider]  Active             Recommendation:   Continue Current Plan of Care Reschedule visit with Dr. Amalia  Follow Up Plan:   Telephone follow up appointment date/time:   05/27/24 at 10 AM Patient has been scheduled for follow-up with BSW 05/08/24 at 3 PM  [name], RN MSN [place]  [facility] RN Care Manager Direct Dial: [phone]  Fax: [phone]

## 2024-05-06 ENCOUNTER — Ambulatory Visit

## 2024-05-06 DIAGNOSIS — J4550 Severe persistent asthma, uncomplicated: Secondary | ICD-10-CM | POA: Diagnosis not present

## 2024-05-06 NOTE — Telephone Encounter (Signed)
 Received message from [employer].   We would need a new order for a bari shower chair because patient is 342 pounds so the standard would not work  Please place new order for bariatric shower chair.

## 2024-05-06 NOTE — Telephone Encounter (Signed)
 New order signed.

## 2024-05-06 NOTE — Telephone Encounter (Signed)
[employer] has been updated.

## 2024-05-06 NOTE — Addendum Note (Signed)
 Addended by: MADELON DONALD HERO on: 05/06/2024 11:21 AM   Modules accepted: Orders

## 2024-05-07 DIAGNOSIS — H0102B Squamous blepharitis left eye, upper and lower eyelids: Secondary | ICD-10-CM | POA: Diagnosis not present

## 2024-05-07 DIAGNOSIS — M1712 Unilateral primary osteoarthritis, left knee: Secondary | ICD-10-CM | POA: Diagnosis not present

## 2024-05-07 DIAGNOSIS — L719 Rosacea, unspecified: Secondary | ICD-10-CM | POA: Diagnosis not present

## 2024-05-07 DIAGNOSIS — H401131 Primary open-angle glaucoma, bilateral, mild stage: Secondary | ICD-10-CM | POA: Diagnosis not present

## 2024-05-07 DIAGNOSIS — H04123 Dry eye syndrome of bilateral lacrimal glands: Secondary | ICD-10-CM | POA: Diagnosis not present

## 2024-05-07 DIAGNOSIS — M545 Low back pain, unspecified: Secondary | ICD-10-CM | POA: Diagnosis not present

## 2024-05-07 DIAGNOSIS — H40033 Anatomical narrow angle, bilateral: Secondary | ICD-10-CM | POA: Diagnosis not present

## 2024-05-07 DIAGNOSIS — H1045 Other chronic allergic conjunctivitis: Secondary | ICD-10-CM | POA: Diagnosis not present

## 2024-05-07 DIAGNOSIS — H0102A Squamous blepharitis right eye, upper and lower eyelids: Secondary | ICD-10-CM | POA: Diagnosis not present

## 2024-05-08 ENCOUNTER — Other Ambulatory Visit: Payer: Self-pay

## 2024-05-08 NOTE — Patient Outreach (Signed)
 Social Drivers of Health  Community Resource and Care Coordination Visit Note   05/08/2024  Name: Kelsey Bullock MRN: [id number] DOB:1961/05/15  Situation: Referral received for [facility] needs assessment and assistance related to Transportation [employer] Insecurity . I obtained verbal consent from Patient.  Visit completed with Patient on the phone.   Background:   SDOH Interventions Today    Flowsheet Row Most Recent Value  SDOH Interventions   Food Insecurity Interventions Community Resources Provided  [Patient gets FNS. BSW will provide resources for  food.]  Housing Interventions Intervention Not Indicated  Transportation Interventions Payor Benefit  [patient has a van. Patient also uses medicaid transportation for doctor appointments. BSW will help patient apply for SCAT>]  Utilities Interventions Intervention Not Indicated     Assessment:   Goals Addressed             This Visit's Progress    BSW Goal       Current SDOH Barriers:  Food It Consultant strain DME need (lift chair)  Interventions: Patient interviewed and appropriate screenings performed Referred patient to community resources  Provided patient with information about food resources, DME resources, and transportation resources Discussed plans with patient for ongoing follow up and provided patient with direct contact number Advised patient to f/u with DME resources for lift chair and review mailed resources.           Recommendation:   attend all scheduled provider appointments call for transportation assistance at least one week before appointments ask for help if you don't understand your health insurance benefits call and/or follow up with food resources for food assistance F/u with DME resources for lift chair.  Follow Up Plan:   Telephone follow up appointment date/time:  05/22/2019 at 10AM.  [name], BSW [place]/VBCI - [facility] Social Worker [phone]

## 2024-05-08 NOTE — Patient Instructions (Signed)
 Visit Information  Kelsey Bullock was given information about Medicaid Managed Care team care coordination services as a part of their [facility] Community Plan Medicaid benefit.   If you would like to schedule transportation through your [facility], please call the following number at least 2 days in advance of your appointment: [phone]   Rides for urgent appointments can also be made after hours by calling Member Services.  Call the Behavioral Health Crisis Line at [phone], at any time, 24 hours a day, 7 days a week. If you are in danger or need immediate medical attention call 911.  Please see education materials related to food, housing resources provided by mail.  Patient verbalizes understanding of instructions and care plan provided today and agrees to view in MyChart. Active MyChart status and patient understanding of how to access instructions and care plan via MyChart confirmed with patient.     Telephone follow up appointment with Managed Medicaid care management team member scheduled for: 05/21/2024 at 10AM  Kelsey Bullock, [place]/VBCI - [facility] Social Worker [phone]   Following is a copy of your plan of care:  There are no care plans that you recently modified to display for this patient.

## 2024-05-12 ENCOUNTER — Encounter (HOSPITAL_COMMUNITY): Payer: Self-pay | Admitting: Surgery

## 2024-05-14 ENCOUNTER — Encounter: Payer: Self-pay | Admitting: Physician Assistant

## 2024-05-14 ENCOUNTER — Ambulatory Visit: Admitting: Physician Assistant

## 2024-05-14 VITALS — BP 114/66

## 2024-05-14 DIAGNOSIS — L219 Seborrheic dermatitis, unspecified: Secondary | ICD-10-CM | POA: Diagnosis not present

## 2024-05-14 MED ORDER — KETOCONAZOLE 2 % EX SHAM: 1.0000 | MEDICATED_SHAMPOO | CUTANEOUS | 11 refills | Status: AC

## 2024-05-14 MED ORDER — HYDROCORTISONE 2.5 % EX LOTN: TOPICAL_LOTION | Freq: Every day | CUTANEOUS | 11 refills | Status: AC

## 2024-05-14 NOTE — Patient Instructions (Signed)

## 2024-05-14 NOTE — Progress Notes (Signed)
° °  New Patient Visit   Subjective  Kelsey Bullock is a 63 y.o. female NEW PATIENT who presents for the following: Flakiness of scalp and face. She is using cocoa butter and Vitamin E. She washes with a regular face wash (from [employer] or [employer]). She is using ketoconazole  and [employer] Zinc  shampoo.  Accompanied by son  The following portions of the chart were reviewed this encounter and updated as appropriate: medications, allergies, medical history  Review of Systems:  No other skin or systemic complaints except as noted in HPI or Assessment and Plan.  Objective  Well appearing patient in no apparent distress; mood and affect are within normal limits.   A focused examination was performed of the following areas: SCALP, FACE, EARS, AND NECK    Relevant exam findings are noted in the Assessment and Plan.    Assessment & Plan   SEBORRHEIC DERMATITIS Exam: Pink patches with greasy scale at face, scalp   Seborrheic Dermatitis is a chronic persistent rash characterized by pinkness and scaling most commonly of the mid face but also can occur on the scalp (dandruff), ears; mid chest, mid back and groin.  It tends to be exacerbated by stress and cooler weather.  People who have neurologic disease may experience new onset or exacerbation of existing seborrheic dermatitis.  The condition is not curable but treatable and can be controlled.  Treatment Plan: Ketoconazole  2% shampoo Wash face and scalp 2-3 times per week as needed  Hydrocortisone  2.5% lotion Apply to affected areas 2-3 times per week as needed.    SEBORRHEIC DERMATITIS    Return if symptoms worsen or fail to improve.  I, Roseline Hutchinson, CMA, am acting as scribe for Deborah Dondero K, PA-C .   Documentation: I have reviewed the above documentation for accuracy and completeness, and I agree with the above.  Darrek Leasure K, PA-C

## 2024-05-18 NOTE — Progress Notes (Unsigned)
° °  SUBJECTIVE:   CHIEF COMPLAINT / HPI:   Discussed the use of AI scribe software for clinical note transcription with the patient, who gave verbal consent to proceed.  History of Present Illness Kelsey Bullock is a 63 year old female with elevated BP who presents for follow-up on blood pressure management.  Hypertension - Longstanding h/o hypertension. - Not checking blood pressure at home, hasn't gotten batteries yet. Has a large cuff. - Not on a daily antihypertensive medication.  - Uses furosemide  only as needed for swelling.  Sleep apnea and sleep disturbance - History of sleep apnea. - Past CPAP use discontinued due to choking sensations. - Sister observes apnea during sleep. - Snores and wakes suddenly at night. - Typically sleeps only two to four hours per night.  Hyperlipidemia - History of elevated cholesterol. - Previously on a statin. - Prescribed rosuvastatin  but takes it inconsistently. - May need a refill of rosuvastatin . - Recent laboratory results showed elevated cholesterol.  Vertigo - Longstanding vertigo triggered by looking down, bending over, or moving quickly. - Home maneuvers have not resolved symptoms. - Manages symptoms by moving more slowly and adjusting posture.   OBJECTIVE:   BP (!) 124/51   Pulse 77   Wt (!) 328 lb 9.6 oz (149.1 kg)   LMP 02/03/2019 Comment: Tubal Ligation  SpO2 99%   BMI 58.21 kg/m   Gen: well appearing, in NAD Card: Reg rate Lungs: Comfortable WOB on RA Ext: WWP  ASSESSMENT/PLAN:   Hypertension Within normal limits today with recent normal reading at outside office. No changes today. Advised to monitor at home.   Sleep apnea Referred for sleep studies.   Vertigo Still symptomatic though managing. Discussed vestibular rehab referral, declines. Will let me know if she changes her mind.  Hypercholesterolemia Not on statin consistently, recent lipid panel not at goal. Refilled crestor . Advised regular use,  plan to recheck labs at follow up.     Donald CHRISTELLA Lai, DO

## 2024-05-19 ENCOUNTER — Encounter: Payer: Self-pay | Admitting: Family Medicine

## 2024-05-19 ENCOUNTER — Ambulatory Visit: Admitting: Family Medicine

## 2024-05-19 VITALS — BP 124/51 | HR 77 | Wt 328.6 lb

## 2024-05-19 DIAGNOSIS — E7800 Pure hypercholesterolemia, unspecified: Secondary | ICD-10-CM

## 2024-05-19 DIAGNOSIS — R42 Dizziness and giddiness: Secondary | ICD-10-CM

## 2024-05-19 DIAGNOSIS — G4730 Sleep apnea, unspecified: Secondary | ICD-10-CM

## 2024-05-19 DIAGNOSIS — I10 Essential (primary) hypertension: Secondary | ICD-10-CM | POA: Diagnosis not present

## 2024-05-19 MED ORDER — ROSUVASTATIN CALCIUM 10 MG PO TABS: 10.0000 mg | ORAL_TABLET | Freq: Every day | ORAL | 3 refills | Status: AC

## 2024-05-19 NOTE — Assessment & Plan Note (Signed)
 Referred for sleep studies.

## 2024-05-19 NOTE — Patient Instructions (Signed)
 It was great to see you!  Our plans for today:  - We are referring you to Sleep Studies. Let us  know if you don't hear about an appointment in the next few weeks.  - We sent refills of your rosuvastatin  today. Take this consistently over the next few weeks and we will recheck your cholesterol levels in 6 weeks.   Take care and seek immediate care sooner if you develop any concerns.   Dr. Lonette Stevison

## 2024-05-19 NOTE — Assessment & Plan Note (Signed)
 Still symptomatic though managing. Discussed vestibular rehab referral, declines. Will let me know if she changes her mind.

## 2024-05-19 NOTE — Assessment & Plan Note (Signed)
 Within normal limits today with recent normal reading at outside office. No changes today. Advised to monitor at home.

## 2024-05-19 NOTE — Assessment & Plan Note (Signed)
 Not on statin consistently, recent lipid panel not at goal. Refilled crestor . Advised regular use, plan to recheck labs at follow up.

## 2024-05-21 ENCOUNTER — Other Ambulatory Visit: Payer: Self-pay

## 2024-05-21 NOTE — Patient Outreach (Signed)
 Social Drivers of Health  Community Resource and Care Coordination Visit Note   05/21/2024  Name: ARMANII URBANIK MRN: [id number] DOB:1960-09-15  Situation: Referral received for [facility] needs assessment and assistance related to Transportation [employer] Insecurity . I obtained verbal consent from Patient.  Visit completed with Patient on the phone.   Background:   SDOH Interventions Today    Flowsheet Row Most Recent Value  SDOH Interventions   Food Insecurity Interventions Community Resources Provided  [patient received resources but has not reviewed them.]  Transportation Interventions Payor Benefit  [BSW completed Part A of HPTS ACCESS application and e-faxed part B to PCP.]     Assessment:   Goals Addressed             This Visit's Progress    BSW Goal       Current SDOH Barriers:  Food It Consultant strain DME need (lift chair)  Interventions: Patient interviewed and appropriate screenings performed Referred patient to community resources  Provided patient with information about food resources, DME resources, and transportation resources Discussed plans with patient for ongoing follow up and provided patient with direct contact number Advised patient to f/u with DME resources for lift chair and review mailed resources.  05/21/2024 BSW faxed part B of HPTS ACCESS application to PCP.            Recommendation:   attend all scheduled provider appointments call for transportation assistance at least one week before appointments ask for help if you don't understand your health insurance benefits F/u on ACCESS Application with PCP.   Follow Up Plan:   Telephone follow up appointment date/time:  06/04/2024 at 11Am  [name], [place]/VBCI - [facility] Social Worker [phone]

## 2024-05-21 NOTE — Patient Instructions (Signed)
 Visit Information  Ms. Tomaro was given information about Medicaid Managed Care team care coordination services as a part of their [facility] Community Plan Medicaid benefit.   If you would like to schedule transportation through your [facility], please call the following number at least 2 days in advance of your appointment: [phone]   Rides for urgent appointments can also be made after hours by calling Member Services.  Call the Behavioral Health Crisis Line at [phone], at any time, 24 hours a day, 7 days a week. If you are in danger or need immediate medical attention call 911.   Patient verbalizes understanding of instructions and care plan provided today and agrees to view in MyChart. Active MyChart status and patient understanding of how to access instructions and care plan via MyChart confirmed with patient.     Telephone follow up appointment with Managed Medicaid care management team member scheduled for: 06/04/2024 at 11AM.  [name], BSW [place]/VBCI - [facility] Social Worker [phone]   Following is a copy of your plan of care:   Goals Addressed             This Visit's Progress    BSW Goal       Current SDOH Barriers:  Food insecurity Transportation Financial strain DME need (lift chair)  Interventions: Patient interviewed and appropriate screenings performed Referred patient to community resources  Provided patient with information about food resources, DME resources, and transportation resources Discussed plans with patient for ongoing follow up and provided patient with direct contact number Advised patient to f/u with DME resources for lift chair and review mailed resources.  05/21/2024 BSW faxed part B of HPTS ACCESS application to PCP.

## 2024-05-27 ENCOUNTER — Other Ambulatory Visit: Payer: Self-pay

## 2024-05-27 NOTE — Patient Instructions (Signed)
 Visit Information  Kelsey Bullock was given information about Medicaid Managed Care team care coordination services as a part of their [facility] Community Plan Medicaid benefit.   If you would like to schedule transportation through your [facility], please call the following number at least 2 days in advance of your appointment: [phone]   Rides for urgent appointments can also be made after hours by calling Member Services.  Call the Behavioral Health Crisis Line at [phone], at any time, 24 hours a day, 7 days a week. If you are in danger or need immediate medical attention call 911.   Care plan and visit instructions communicated with the patient verbally today. Patient agrees to receive a copy in MyChart. Active MyChart status and patient understanding of how to access instructions and care plan via MyChart confirmed with patient.     Telephone follow up appointment with Managed Medicaid care management team member scheduled for: 06/24/24 at 10 AM  Continue with your efforts to take medications on a consistent basis! Put batteries in your BP monitor and place it in a location where you will see it and remember to check. Try to get in the habit of checking your BP and weight daily.  Kelsey Finlay, RN MSN [place]  VBCI Population Health RN Care Manager Direct Dial: [phone]  Fax: [phone]   Following is a copy of your plan of care:   Goals Addressed             This Visit's Progress    VBCI RN Care Plan   No change    Problems:  Chronic Disease Management support and education needs related to CHF and HTN  Goal: Over the next 30 days the Patient will demonstrate a decrease CHF in exacerbations as evidenced by patient report of no symptoms of fluid overload, decreased use of PRN lasix  demonstrate Improved adherence to prescribed treatment plan for CHF and HTN as evidenced by checking BP and weight daily demonstrate  understanding of rationale for each prescribed medication as evidenced by patient report of taking medications as ordered, instead of taking medications as needed     Interventions:   Heart Failure Interventions: Reviewed Heart Failure Action Plan in depth and provided written copy Assessed need for readable accurate scales in home Provided education about placing scale on hard, flat surface Advised patient to weigh each morning after emptying bladder Discussed importance of daily weight and advised patient to weigh and record daily Reviewed role of diuretics in prevention of fluid overload and management of heart failure; Discussed the importance of keeping all appointments with provider Reviewed red flag symptoms and when to contact PCP   Hypertension Interventions: Last practice recorded BP readings:  BP Readings from Last 3 Encounters:  05/19/24 (!) 124/51  05/14/24 114/66  04/23/24 (!) 141/72   Most recent eGFR/CrCl:  Lab Results  Component Value Date   EGFR 101 09/13/2023    No components found for: CRCL  Evaluation of current treatment plan related to hypertension self management and patient's adherence to plan as established by provider Reviewed medications with patient and discussed importance of compliance Discussed complications of poorly controlled blood pressure such as heart disease, stroke, circulatory complications, vision complications, kidney impairment, sexual dysfunction Screening for signs and symptoms of depression related to chronic disease state  Ensured patient has batteries for BP monitor and encouraged to begin checking BP regularly. Advised placing monitor in a location where she will see it as a  reminder to check. Advised checking BP 1-2 hours after taking medication Educated patient on goal BP <130/80    Patient Self-Care Activities:  Attend all scheduled provider appointments Call provider office for new concerns or questions  Take medications  as prescribed   Work with the social worker to address care coordination needs and will continue to work with the clinical team to address health care and disease management related needs call office if I gain more than 2 pounds in one day or 5 pounds in one week do ankle pumps when sitting keep legs up while sitting watch for swelling in feet, ankles and legs every day weigh myself daily Put batteries in your BP monitor and begin checking every 1-2 days Reschedule visit with Dr. Amalia  Plan:  Telephone follow up appointment with care management team member scheduled for:  06/24/24 at 10 AM          VBCI RN Care Plan related to financial constraints   No change    Problems:  Care Coordination needs related to Financial Strain , Housing , and Transportation Difficulty obtaining medications Corporate Treasurer.  Goal: Over the next 30 days the Patient will work with pharmacist to address Medication procurement related to difficulty affording medications as evidenced by review of electronic medical record and patient or pharmacist report    work with child psychotherapist to address Financial constraints related to housing, transportation related to the management of chronic conditions as evidenced by review of electronic medical record and patient or social worker report      Interventions:   Noted per chart review patient that patient has not rescheduled cancelled appointment with Dr. Koval. Emphasized importance of close follow-up with pharmacist and encouraged patient to call and schedule follow-up appointment Ensured patient has received resources provided by BSW and reminded of upcoming follow-up  Patient Self-Care Activities:  Attend all scheduled provider appointments Call provider office for new concerns or questions  Take medications as prescribed   Work with the social worker to address care coordination needs and will continue to work with the clinical team to address health  care and disease management related needs Work with the pharmacist to address medication management needs and will continue to work with the clinical team to address health care and disease management related needs  Plan:  Telephone follow up appointment with care management team member scheduled for:  06/24/24 at 10 AM

## 2024-05-27 NOTE — Patient Outreach (Signed)
 Complex Care Management   Visit Note  05/27/2024  Name:  Kelsey Bullock MRN: [id number] DOB: [birth year]  Situation: Referral received for Complex Care Management related to Heart Failure and medication adherence concerns I obtained verbal consent from Patient.  Visit completed with Patient  on the phone  Background:   Past Medical History:  Diagnosis Date   Allergic rhinitis    Anemia    Anxiety    Arthritis    knees and back   Asthma    Asthma 11/26/2006   Qualifier: Diagnosis of   By: Scarlet MD, Elsie         Complication of anesthesia    Congestive heart failure (CHF) (HCC)    Depression    Gall stone    GERD (gastroesophageal reflux disease)    Headache    migraines, stress headaches   History of kidney stones    Hypertension    Hyperthyroidism    during pregnancy   Pneumonia    PONV (postoperative nausea and vomiting)    Recurrent upper respiratory infection (URI)    Sleep apnea    can't use Cpap    Assessment: Patient Reported Symptoms:  Cognitive Cognitive Status: Able to follow simple commands, Alert and oriented to person, place, and time, Normal speech and language skills Cognitive/Intellectual Conditions Management [RPT]: None reported or documented in medical history or problem list   Health Maintenance Behaviors: Annual physical exam Health Facilitated by: Pain control, Rest  Neurological Neurological Review of Symptoms: Other: [place] Neurological Symptoms/Conditions [RPT]: Pain related to fibromyalgia. Patient reports she is taking Duloxetine  about 2-3 times a week Neurological Management Strategies: Routine screening, Medication therapy  HEENT HEENT Symptoms Reported: No symptoms reported HEENT Management Strategies: Routine screening    Cardiovascular Cardiovascular Symptoms Reported: Swelling in legs or feet (continued swelling BLE, L>R at baseline per patient) Does patient have uncontrolled Hypertension?: Yes Is patient checking Blood  Pressure at home?: No Patient's Recent BP reading at home: Patient reports she still has not put batteries in her BP monitor. She reports she does have batteries but keeps forgetting to put them in her monitor and check at home Cardiovascular Management Strategies: Medical device, Medication therapy, Weight management Do You Have a Working Readable Scale?: Yes Cardiovascular Comment: Patient reports she has not been checking her weight. She states she has been taking Crestor  2-3 times a week  Respiratory Respiratory Symptoms Reported: No symptoms reported Additional Respiratory Details: Note per chart review patient was referred for sleep study by PCP. Patient reports she has not heard from office to schedule yet. Respiratory Management Strategies: Adequate rest, Coping strategies, Medication therapy, Routine screening  Endocrine Endocrine Symptoms Reported: No symptoms reported Is patient diabetic?: No    Gastrointestinal Gastrointestinal Symptoms Reported: No symptoms reported Additional Gastrointestinal Details: Patient reports last BM yesterday Gastrointestinal Management Strategies: Medication therapy    Genitourinary Genitourinary Symptoms Reported: No symptoms reported    Integumentary Integumentary Symptoms Reported: No symptoms reported    Musculoskeletal Musculoskelatal Symptoms Reviewed: Back pain, Difficulty walking, Limited mobility, Other Other Musculoskeletal Symptoms: Chronic generalized pain Musculoskeletal Management Strategies: Medical device, Routine screening, Adequate rest Musculoskeletal Comment: Patient reports she did receive shower chair but has not been using it yet because she is scared that it is too small. Advised patient to look on equipment to see if there is a sticker indicating the weight limit. Patient denies falls since previous CMRN visit. Falls in the past year?: Yes Number of falls in past year: 2  or more Was there an injury with Fall?: No Fall Risk  Category Calculator: 2 Patient Fall Risk Level: Moderate Fall Risk Patient at Risk for Falls Due to: History of fall(s), Impaired balance/gait, Impaired mobility, Other (Comment) (Vertigo) Fall risk Follow up: Falls evaluation completed, Education provided, Falls prevention discussed  Psychosocial Psychosocial Symptoms Reported: Anxiety - if selected complete GAD Behavioral Management Strategies: Coping strategies, Counseling, Medication therapy Major Change/Loss/Stressor/Fears (CP): Medical condition, self, Resources Techniques to [employer] with Loss/Stress/Change: Spiritual practice(s), Diversional activities Quality of Family Relationships: helpful, involved, supportive Do you feel physically threatened by others?: No    05/27/2024    PHQ2-9 Depression Screening   Little interest or pleasure in doing things Not at all  Feeling down, depressed, or hopeless Not at all  PHQ-2 - Total Score 0  Trouble falling or staying asleep, or sleeping too much    Feeling tired or having little energy    Poor appetite or overeating     Feeling bad about yourself - or that you are a failure or have let yourself or your family down    Trouble concentrating on things, such as reading the newspaper or watching television    Moving or speaking so slowly that other people could have noticed.  Or the opposite - being so fidgety or restless that you have been moving around a lot more than usual    Thoughts that you would be better off dead, or hurting yourself in some way    PHQ2-9 Total Score    If you checked off any problems, how difficult have these problems made it for you to do your work, take care of things at home, or get along with other people    Depression Interventions/Treatment      There were no vitals filed for this visit. Pain Scale: 0-10 Pain Score: 6  Pain Type: Chronic pain Pain Location: Generalized  Medications Reviewed Today     Reviewed by Arno Rosaline SQUIBB, RN (Registered Nurse)  on 05/27/24 at 1006  Med List Status: <None>   Medication Order Taking? Sig Documenting Provider Last Dose Status Informant  albuterol  (PROVENTIL ) (2.5 MG/3ML) 0.083% nebulizer solution [id number]  Take 3 mLs (2.5 mg total) by nebulization every 4 (four) hours as needed. USE 1 VIAL VIA NEBULIZER EVERY 4 TO 6 HOURS AS NEEDED FOR COUGH OR WHEEZING Kozlow, Camellia PARAS, MD  Active   albuterol  (VENTOLIN  HFA) 108 (90 Base) MCG/ACT inhaler [id number]  Inhale 2 puffs into the lungs every 4 (four) hours as needed. Kozlow, Camellia PARAS, MD  Active   Ascorbic Acid  (VITAMIN C ) 100 MG tablet [id number]  Take 100 mg by mouth daily. [provider]  Active   ASHWAGANDHA GUMMIES PO [id number]  Take 1 capsule by mouth as needed. [provider]  Active   benzonatate  (TESSALON ) 200 MG capsule [id number]  Take 200 mg by mouth 2 (two) times daily as needed for cough. [provider]  Active   Blood Pressure Monitoring (BLOOD PRESSURE CUFF) MISC [id number]  1 each by Does not apply route daily. Rumball, Alison M, DO  Active   Clindamycin  Phos, Once-Daily, (CLINDAGEL) 1 % GEL [id number]  Apply 1 application  topically 2 (two) times daily as needed (boils). Tharon Lung, MD  Active   clonazePAM  (KLONOPIN ) 1 MG tablet [id number]  Take 1 mg by mouth daily. [provider]  Active   Cyanocobalamin  (B-12 SUPER [place] [id number]  Place under the tongue. [provider]  Active  cyclobenzaprine  (FLEXERIL ) 10 MG tablet [id number]  Take 1 tablet (10 mg total) by mouth 3 (three) times daily as needed for muscle spasms. Rosalynn Camie CROME, MD  Active   diclofenac  Sodium (VOLTAREN ) 1 % GEL [id number]  Apply 4 g topically 4 (four) times daily.  Patient not taking: Reported on 03/12/2024   Austin Ade, MD  Active   dicyclomine (BENTYL) 10 MG capsule [id number]  Take 10 mg by mouth every 6 (six) hours as needed. [provider]  Active   doxycycline  (VIBRAMYCIN ) 100 MG capsule [id number]  Take 1 capsule  (100 mg total) by mouth 2 (two) times daily. Logan Ubaldo NOVAK, PA-C  Active   DULoxetine  (CYMBALTA ) 30 MG capsule [id number]  Take 1 capsule (30 mg total) by mouth daily. Rumball, Alison M, DO  Active   EPINEPHrine  (EPIPEN  2-PAK) 0.3 mg/0.3 mL IJ SOAJ injection [id number]  Inject 0.3 mg into the muscle as needed for anaphylaxis. Kozlow, Eric J, MD  Active   Erythromycin  (AKNE-MYCIN  EX) [id number]  Apply 1 application  topically in the morning, at noon, and at bedtime. Thin layer in affected eyes  Patient not taking: Reported on 03/12/2024   [provider]  Active   famotidine  (PEPCID ) 20 MG tablet [id number]  Take 1 tablet (20 mg total) by mouth in the morning. Kozlow, Camellia PARAS, MD  Active            Med Note (Adriona Kaney P   Mon Sep 03, 2023 10:34 AM) Patient reports taking as needed.  fluticasone  (FLONASE ) 50 MCG/ACT nasal spray [id number]  Place 1 spray into both nostrils daily. Kozlow, Eric J, MD  Active   furosemide  (LASIX ) 20 MG tablet [id number]  Take 20 mg daily as needed for leg swelling.  If leg swelling persists, can take additional 20 mg in the evening for total of 40 mg that day.  Patient taking differently: Take 40 mg by mouth daily. Take 20 mg daily as needed for leg swelling.  If leg swelling persists, can take additional 20 mg in the evening for total of 40 mg that day.   Shitarev, Dimitry, MD  Active   hydrocortisone  2.5 % lotion [id number]  Apply topically daily. Apply to affected areas 2-3 times per week as needed Sandridge, Brenda K, PA-C  Active   ipratropium (ATROVENT ) 0.06 % nasal spray [id number]  Place 2 sprays into both nostrils 4 (four) times daily. USE 2 SPRAYS INTO EACH NOSTRIL EVERY 6 HOURS AS NEEDED FOR TO DRY UP NOSE.  Patient not taking: Reported on 03/12/2024   Kozlow, Eric J, MD  Active   ketoconazole  (NIZORAL ) 2 % cream [id number]  Apply 1 Application topically 2 (two) times daily. Use for two weeks  Patient not taking: Reported on 03/12/2024   Joshua Domino, DO  Active   ketoconazole  (NIZORAL ) 2 % shampoo [id number]  Apply 1 Application topically 2 (two) times a week.  Patient not taking: Reported on 03/12/2024   Rosalynn Camie CROME, MD  Active   ketoconazole  (NIZORAL ) 2 % shampoo [id number]  Apply 1 Application topically as directed. Wash face and scalp 2-3 times per week as needed Sandridge, Brenda K, PA-C  Active   levocetirizine (XYZAL ) 5 MG tablet [id number]  Take 1 tablet (5 mg total) by mouth daily as needed for allergies (Can take an extra dose during flare ups.). Kozlow, Camellia PARAS, MD  Active   linaclotide  (LINZESS ) 72 MCG capsule [id number]  Take 2 capsules (144 mcg total) by mouth daily  before breakfast.  Patient taking differently: Take 145 mcg by mouth daily before breakfast.   Scarlet Elsie LABOR, MD  Active            Med Note (Celinda Dethlefs P   Mon Sep 03, 2023 10:35 AM) Patient reports taking as needed.  mometasone -formoterol  (DULERA ) 200-5 MCG/ACT AERO [id number]  Inhale 2 puffs into the lungs in the morning and at bedtime. Take twice daily for 1-2 weeks with respiratory illness or asthma flare. Kozlow, Camellia PARAS, MD  Active   montelukast  (SINGULAIR ) 10 MG tablet [id number]  Take 1 tablet (10 mg total) by mouth at bedtime. Take one tablet by mouth at bedtime Kozlow, Eric J, MD  Active   Multiple Vitamins-Minerals (MULTIVITAMIN GUMMIES ADULTS PO) [id number]  Take 1 tablet by mouth daily. [provider]  Active   mupirocin  ointment (BACTROBAN ) 2 % [id number]  Apply 1 Application topically 2 (two) times daily. [provider]  Active   JESSE SCHLOSSMAN [id number]  Mushroom Complex [provider]  Active   NYSTATIN  powder [id number]  Apply topically 3 (three) times daily.  Patient not taking: Reported on 03/12/2024   [provider]  Active   Olopatadine  HCl 0.7 % SOLN [id number]  Apply 1 drop to eye daily.  Patient not taking: Reported on 03/12/2024   [provider]  Active            Med Note (Raevyn Sokol,  Anginette Espejo P   Tue Oct 16, 2023 11:32 AM) Patient reports taking as needed  Omega-3 Fatty Acids (MEGARED ADVANCED OMEGA-3 PO) [id number]  Take 1 capsule by mouth daily.  Patient not taking: Reported on 03/12/2024   [provider]  Active   omeprazole  (PRILOSEC) 40 MG capsule [id number]  Take 1 capsule (40 mg total) by mouth daily. Madelon Donald HERO, DO  Active            Med Note (Jareli Highland P   Mon Sep 03, 2023 10:34 AM) Patient reports taking as needed.  oxycodone  (OXY-IR) 5 MG capsule [id number]  Take 5 mg by mouth every 4 (four) hours as needed. [provider]  Active   Probiotic Product (PROBIOTIC BLEND PO) [id number]  Take by mouth. [provider]  Active   Pyrithione Zinc  (DHS ZINC ) 2 % SHAM [id number]  Apply to scalp 3 to 5 times a week as ditreted  Patient not taking: Reported on 03/12/2024   Rumball, Alison M, DO  Active   Respiratory Therapy Supplies (NEBULIZER MASK ADULT) MISC [id number]  1 Device by Does not apply route as needed.  Patient not taking: Reported on 03/12/2024   Kozlow, Eric J, MD  Active   rosuvastatin  (CRESTOR ) 10 MG tablet [id number]  Take 1 tablet (10 mg total) by mouth daily. Rumball, Alison M, DO  Active   Spacer/Aero-Holding Raguel FRENCH [id number]  Use as directed with inhaler. Kozlow, Camellia PARAS, MD  Active   tezepelumab -ekko (TEZSPIRE ) 210 MG/1. syringe 210 mg [id number]   Kozlow, Eric J, MD  Active   TEZSPIRE  210 MG/1. syringe [id number]  INJECT 210 MG UNDER THE SKIN EVERY 4 WEEKS (TO BE ADMINISTERED BY A HEALTHCARE PROVIDER IN A HEALTHCARE SETTING)  Patient not taking: Reported on 03/12/2024   Kozlow, Eric J, MD  Active   tiZANidine  (ZANAFLEX ) 4 MG capsule [id number]  Take 4 mg by mouth 3 (three) times daily. [provider]  Active   traMADol  (ULTRAM ) 50 MG tablet [id number]  Take by mouth every 6 (six) hours  as needed. [provider]  Active   triamcinolone  (KENALOG ) 0.025 % ointment [id number]  Apply topically  2 (two) times daily.  Patient not taking: Reported on 03/12/2024   Howell Lunger, DO  Active   Turmeric (QC TUMERIC COMPLEX PO) [id number]  Take by mouth. [provider]  Active             Recommendation:   Specialty provider follow-up reschedule visit with Dr. Koval Continue Current Plan of Care Continue with efforts to take medications as ordered! Get into the habit of checking your BP and weight daily  Follow Up Plan:   Telephone follow up appointment date/time:  06/24/24 at 10 AM  [name], RN MSN [place]  [facility] Health RN Care Manager Direct Dial: [phone]  Fax: [phone]

## 2024-06-03 ENCOUNTER — Ambulatory Visit (INDEPENDENT_AMBULATORY_CARE_PROVIDER_SITE_OTHER)

## 2024-06-03 DIAGNOSIS — J4550 Severe persistent asthma, uncomplicated: Secondary | ICD-10-CM | POA: Diagnosis not present

## 2024-06-04 ENCOUNTER — Other Ambulatory Visit: Payer: Self-pay

## 2024-06-04 NOTE — Patient Instructions (Signed)
 Visit Information  Kelsey Bullock was given information about Medicaid Managed Care team care coordination services as a part of their [facility] Community Plan Medicaid benefit.   If you would like to schedule transportation through your [facility], please call the following number at least 2 days in advance of your appointment: [phone]   Rides for urgent appointments can also be made after hours by calling Member Services.  Call the Behavioral Health Crisis Line at [phone], at any time, 24 hours a day, 7 days a week. If you are in danger or need immediate medical attention call 911.  Please see education materials related to food and housing resources provided by mail.   Patient verbalizes understanding of instructions and care plan provided today and agrees to view in MyChart. Active MyChart status and patient understanding of how to access instructions and care plan via MyChart confirmed with patient.     Telephone follow up appointment with Managed Medicaid care management team member scheduled for: 06/18/2024 at 11Am.   [name], BSW [place]/VBCI - [employer] Social Worker [phone]   Following is a copy of your plan of care:   Goals Addressed             This Visit's Progress    BSW Goal       Current SDOH Barriers:  Food It Consultant strain DME need (lift chair)  Interventions: Patient interviewed and appropriate screenings performed Referred patient to community resources  Provided patient with information about food resources, DME resources, and transportation resources Discussed plans with patient for ongoing follow up and provided patient with direct contact number Advised patient to f/u with DME resources for lift chair and review mailed resources.  05/21/2024 BSW faxed part B of HPTS ACCESS application to PCP. 06/04/2024 BSW assisted pt in enrolling into fruits and veggies  benefit through [facility] for 6 months. First box will be delivered in 4 weeks.  Mailed food resources and provide immediate options over the phone.  Section 8 housing lists sent via mail.

## 2024-06-04 NOTE — Patient Outreach (Signed)
 Social Drivers of Health  Community Resource and Care Coordination Visit Note   06/04/2024  Name: Kelsey Bullock MRN: [id number] DOB:05-22-1961  Situation: Referral received for [facility] needs assessment and assistance related to Transportation [employer] Insecurity . I obtained verbal consent from Patient.  Visit completed with Patient on the phone.   Background:   SDOH Interventions Today    Flowsheet Row Most Recent Value  SDOH Interventions   Food Insecurity Interventions Community Resources Provided  [out of the garden food schedule for Jan 2026 was shared via mail.]  Housing Interventions Intervention Not Indicated  Transportation Interventions Payor Benefit  Utilities Interventions Intervention Not Indicated  Financial Strain Interventions Intervention Not Indicated     Assessment:   Goals Addressed             This Visit's Progress    BSW Goal       Current SDOH Barriers:  Food It Consultant strain DME need (lift chair)  Interventions: Patient interviewed and appropriate screenings performed Referred patient to community resources  Provided patient with information about food resources, DME resources, and transportation resources Discussed plans with patient for ongoing follow up and provided patient with direct contact number Advised patient to f/u with DME resources for lift chair and review mailed resources.  05/21/2024 BSW faxed part B of HPTS ACCESS application to PCP. 06/04/2024 BSW assisted pt in enrolling into fruits and veggies benefit through [facility] for 6 months. First box will be delivered in 4 weeks.  Mailed food resources and provide immediate options over the phone.  Section 8 housing lists sent via mail.           Recommendation:   attend all scheduled provider appointments call for transportation assistance at least one week before appointments ask for help if you don't understand your health insurance  benefits follow up with housing resources regarding housing needs call and/or follow up with food resources for food assistance  Follow Up Plan:   Telephone follow up appointment date/time:  06/18/2024 at 11AM   [name], [place]/VBCI - [facility] Social Worker [phone]

## 2024-06-16 ENCOUNTER — Other Ambulatory Visit: Payer: Self-pay | Admitting: Family Medicine

## 2024-06-16 DIAGNOSIS — R600 Localized edema: Secondary | ICD-10-CM

## 2024-06-18 ENCOUNTER — Other Ambulatory Visit: Payer: Self-pay

## 2024-06-18 ENCOUNTER — Telehealth: Payer: Self-pay

## 2024-06-18 NOTE — Telephone Encounter (Signed)
 Paperwork completed and faxed out last week. Copy was placed in batch scan pile. Admin team - do you happen to know what day this was? It was a SCAT application.

## 2024-06-18 NOTE — Telephone Encounter (Signed)
 Patient calls nurse line to see if Dr. Madelon has received/completed paperwork for transportation.   She states that child psychotherapist faxed this over a few weeks ago.   Will forward to PCP for update.   Chiquita JAYSON English, RN

## 2024-06-18 NOTE — Patient Instructions (Signed)
 Visit Information  Kelsey Bullock was given information about Medicaid Managed Care team care coordination services as a part of their [facility] Community Plan Medicaid benefit.   If you would like to schedule transportation through your [facility], please call the following number at least 2 days in advance of your appointment: [phone]   Rides for urgent appointments can also be made after hours by calling Member Services.  Call the Behavioral Health Crisis Line at [phone], at any time, 24 hours a day, 7 days a week. If you are in danger or need immediate medical attention call 911.   Patient verbalizes understanding of instructions and care plan provided today and agrees to view in MyChart. Active MyChart status and patient understanding of how to access instructions and care plan via MyChart confirmed with patient.     Telephone follow up appointment with Managed Medicaid care management team member scheduled for: 07/02/2024 at 10:30AM.  [name], BSW [place]/VBCI - [facility] Social Worker [phone]   Following is a copy of your plan of care:   Goals Addressed             This Visit's Progress    BSW Goal       Current SDOH Barriers:  Food insecurity Transportation Financial strain DME need (lift chair)  Interventions: Patient interviewed and appropriate screenings performed Referred patient to community resources  Provided patient with information about food resources, DME resources, and transportation resources Discussed plans with patient for ongoing follow up and provided patient with direct contact number Advised patient to f/u with DME resources for lift chair and review mailed resources.  05/21/2024 BSW faxed part B of HPTS ACCESS application to PCP. 06/04/2024 BSW assisted pt in enrolling into fruits and veggies benefit through [facility] for 6 months. First box will be delivered in 4 weeks.  Mailed food  resources and provide immediate options over the phone.  Section 8 housing lists sent via mail. 06/18/2024 BSW provided food resources for this week over the phone.  BSW will message PCP re DME request for lift chair and SCAT.

## 2024-06-18 NOTE — Patient Outreach (Signed)
 Social Drivers of Health  Community Resource and Care Coordination Visit Note   06/18/2024  Name: Kelsey Bullock MRN: [id number] DOB:1960/06/16  Situation: Referral received for [facility] needs assessment and assistance related to [employer] . I obtained verbal consent from Patient.  Visit completed with Patient on the phone.   Background:   SDOH Interventions Today    Flowsheet Row Most Recent Value  SDOH Interventions   Food Insecurity Interventions Community Resources Provided  [provided food distribution sites for this week over the phone.]  Housing Interventions Intervention Not Indicated  Transportation Interventions Payor Benefit  [BSW will f/u with PCP regarding e-fax for SCAT part B.]  Utilities Interventions Community Resources Provided  [BSW provided phone number for DSS energy programs manager Charita Sutton.]     Assessment:   Goals Addressed             This Visit's Progress    BSW Goal       Current SDOH Barriers:  Food It Consultant strain DME need (lift chair)  Interventions: Patient interviewed and appropriate screenings performed Referred patient to community resources  Provided patient with information about food resources, DME resources, and transportation resources Discussed plans with patient for ongoing follow up and provided patient with direct contact number Advised patient to f/u with DME resources for lift chair and review mailed resources.  05/21/2024 BSW faxed part B of HPTS ACCESS application to PCP. 06/04/2024 BSW assisted pt in enrolling into fruits and veggies benefit through [facility] for 6 months. First box will be delivered in 4 weeks.  Mailed food resources and provide immediate options over the phone.  Section 8 housing lists sent via mail. 06/18/2024 BSW provided food resources for this week over the phone.  BSW will message PCP re DME request for lift chair and SCAT.             Recommendation:   attend all scheduled provider appointments call for transportation assistance at least one week before appointments ask for help if you don't understand your health insurance benefits call and/or follow up with immediate food resources provided for food assistance  Follow Up Plan:   Telephone follow up appointment date/time:  07/02/2024 at 10:30AM.   Kelsey Bullock, BSW [place]/VBCI - [facility] Social Worker [phone]

## 2024-06-19 NOTE — Patient Outreach (Signed)
 BSW called patient to confirm SCAT application was faxed by provider to ACCESS [place]. Unable to leave VM. BSW will send mychart message and provide update at next f/u.

## 2024-06-24 ENCOUNTER — Telehealth: Payer: Self-pay

## 2024-06-25 ENCOUNTER — Telehealth: Payer: Self-pay

## 2024-06-25 NOTE — Patient Outreach (Signed)
 Care Coordination   06/25/2024 Name: Kelsey Bullock MRN: [id number] DOB: 12-27-1960   Care Coordination Outreach Attempts:  An unsuccessful outreach was attempted for an appointment today.  Follow Up Plan:  Additional outreach attempts will be made to complete CCM follow-up visit.   Encounter Outcome:  No Answer. Voicemail box full, unable to leave message.   [name], RN MSN [place]  [facility] RN Care Manager Direct Dial: [phone]  Fax: [phone]

## 2024-06-25 NOTE — Patient Instructions (Signed)
 [name] - I am sorry I was unable to reach you today for our scheduled appointment. I work with Rumball, Alison M, DO and am calling to support your healthcare needs. Please contact me at [phone] at your earliest convenience. I look forward to speaking with you soon.   Thank you,  [name], RN MSN [place]  [facility] RN Care Manager Direct Dial: [phone]  Fax: [phone]

## 2024-06-27 NOTE — Patient Outreach (Signed)
 Care Coordination   06/27/2024 Name: Kelsey Bullock MRN: [id number] DOB: December 08, 1960   Care Coordination Outreach Attempts:  A second unsuccessful outreach was attempted today to complete CCM follow-up visit.  Follow Up Plan:  Additional outreach attempts will be made to complete follow-up visit.   Encounter Outcome:  Spoke with patient, who states she is at the store right now trying to get supplies for the incoming Winter weather. Patient reports her sister will be coming to stay with her. Patient requests a call back at a later date.   [name], RN MSN [place]  [facility] Population [facility] RN Care Manager Direct Dial: [phone]  Fax: [phone]

## 2024-07-01 ENCOUNTER — Ambulatory Visit

## 2024-07-01 NOTE — Patient Instructions (Signed)
 [name] - I have attempted to call you three times but have been unsuccessful in reaching you. I work with Rumball, Alison M, DO and am calling to support your healthcare needs. If I can be of assistance to you, please contact me at [phone].     Thank you,  [name], RN MSN [place]  [facility] RN Care Manager Direct Dial: [phone]  Fax: [phone]

## 2024-07-01 NOTE — Patient Outreach (Signed)
 Care Coordination   07/01/2024 Name: Kelsey Bullock MRN: [id number] DOB: [birth year]   Care Coordination Outreach Attempts:  A third unsuccessful outreach was attempted today to complete CCM follow-up visit.  Follow Up Plan:  No further outreach attempts will be made at this time. We have been unable to contact the patient to complete follow-up visit.  Encounter Outcome:  No Answer. Mailbox full, unable to leave message. Message sent via MyChart.   [name], RN MSN [place]  [facility] Population [facility] RN Care Manager Direct Dial: [phone]  Fax: [phone]

## 2024-07-02 ENCOUNTER — Ambulatory Visit

## 2024-07-02 ENCOUNTER — Other Ambulatory Visit: Payer: Self-pay

## 2024-07-02 NOTE — Patient Instructions (Signed)
 Kelsey Bullock - I am sorry I was unable to reach you today for our scheduled appointment. I work with Rumball, Alison M, DO and am calling to support your healthcare needs. Please contact me at [phone] at your earliest convenience. I look forward to speaking with you soon.   Thank you,   [name], BSW [place]/[facility] - [facility] Social Worker [phone]

## 2024-07-09 ENCOUNTER — Ambulatory Visit

## 2024-07-11 ENCOUNTER — Ambulatory Visit

## 2024-07-11 DIAGNOSIS — J4550 Severe persistent asthma, uncomplicated: Secondary | ICD-10-CM

## 2024-08-08 ENCOUNTER — Ambulatory Visit
